# Patient Record
Sex: Male | Born: 1955
Health system: Southern US, Community
[De-identification: ages and names within clinical notes are randomized; demographics above are authoritative.]

## PROBLEM LIST (undated history)

## (undated) DIAGNOSIS — M419 Scoliosis, unspecified: Secondary | ICD-10-CM

## (undated) DIAGNOSIS — I2510 Atherosclerotic heart disease of native coronary artery without angina pectoris: Secondary | ICD-10-CM

## (undated) DIAGNOSIS — I10 Essential (primary) hypertension: Secondary | ICD-10-CM

## (undated) DIAGNOSIS — R569 Unspecified convulsions: Secondary | ICD-10-CM

## (undated) DIAGNOSIS — F32A Depression, unspecified: Secondary | ICD-10-CM

## (undated) DIAGNOSIS — G809 Cerebral palsy, unspecified: Secondary | ICD-10-CM

## (undated) DIAGNOSIS — F329 Major depressive disorder, single episode, unspecified: Secondary | ICD-10-CM

## (undated) DIAGNOSIS — K219 Gastro-esophageal reflux disease without esophagitis: Secondary | ICD-10-CM

## (undated) HISTORY — DX: Major depressive disorder, single episode, unspecified: F32.9

## (undated) HISTORY — DX: Depression, unspecified: F32.A

## (undated) HISTORY — PX: CORONARY ANGIOPLASTY WITH STENT PLACEMENT: SHX49

## (undated) HISTORY — DX: Scoliosis, unspecified: M41.9

## (undated) HISTORY — DX: Cerebral palsy, unspecified: G80.9

## (undated) HISTORY — DX: Gastro-esophageal reflux disease without esophagitis: K21.9

---

## 2007-05-30 ENCOUNTER — Emergency Department (HOSPITAL_COMMUNITY): Admission: EM | Admit: 2007-05-30 | Discharge: 2007-05-30 | Payer: Self-pay | Admitting: Emergency Medicine

## 2007-05-31 ENCOUNTER — Inpatient Hospital Stay (HOSPITAL_COMMUNITY): Admission: EM | Admit: 2007-05-31 | Discharge: 2007-06-01 | Payer: Self-pay | Admitting: Emergency Medicine

## 2007-05-31 ENCOUNTER — Ambulatory Visit: Payer: Self-pay | Admitting: Cardiology

## 2007-06-01 ENCOUNTER — Encounter: Payer: Self-pay | Admitting: Cardiology

## 2007-06-16 ENCOUNTER — Observation Stay (HOSPITAL_COMMUNITY): Admission: EM | Admit: 2007-06-16 | Discharge: 2007-06-17 | Payer: Self-pay | Admitting: Cardiology

## 2007-06-16 ENCOUNTER — Ambulatory Visit: Payer: Self-pay | Admitting: Cardiology

## 2007-07-06 ENCOUNTER — Ambulatory Visit: Payer: Self-pay | Admitting: Cardiology

## 2007-07-27 ENCOUNTER — Ambulatory Visit: Payer: Self-pay | Admitting: Cardiovascular Disease

## 2007-07-27 ENCOUNTER — Ambulatory Visit: Payer: Self-pay

## 2007-08-24 ENCOUNTER — Ambulatory Visit: Payer: Self-pay | Admitting: Cardiology

## 2007-10-12 ENCOUNTER — Emergency Department (HOSPITAL_COMMUNITY): Admission: EM | Admit: 2007-10-12 | Discharge: 2007-10-12 | Payer: Self-pay | Admitting: Emergency Medicine

## 2007-10-25 ENCOUNTER — Inpatient Hospital Stay (HOSPITAL_COMMUNITY): Admission: EM | Admit: 2007-10-25 | Discharge: 2007-10-26 | Payer: Self-pay | Admitting: Emergency Medicine

## 2007-11-09 ENCOUNTER — Emergency Department (HOSPITAL_COMMUNITY): Admission: EM | Admit: 2007-11-09 | Discharge: 2007-11-09 | Payer: Self-pay | Admitting: Emergency Medicine

## 2007-11-13 ENCOUNTER — Encounter: Payer: Self-pay | Admitting: Emergency Medicine

## 2007-11-14 ENCOUNTER — Ambulatory Visit: Payer: Self-pay | Admitting: Psychiatry

## 2007-11-14 ENCOUNTER — Inpatient Hospital Stay (HOSPITAL_COMMUNITY): Admission: AD | Admit: 2007-11-14 | Discharge: 2007-11-15 | Payer: Self-pay | Admitting: Psychiatry

## 2008-11-05 ENCOUNTER — Ambulatory Visit: Payer: Self-pay | Admitting: Cardiology

## 2009-09-01 ENCOUNTER — Ambulatory Visit: Payer: Self-pay | Admitting: Internal Medicine

## 2009-09-01 ENCOUNTER — Encounter: Payer: Self-pay | Admitting: Emergency Medicine

## 2009-09-01 ENCOUNTER — Inpatient Hospital Stay (HOSPITAL_COMMUNITY): Admission: EM | Admit: 2009-09-01 | Discharge: 2009-09-03 | Payer: Self-pay | Admitting: Emergency Medicine

## 2009-10-08 DIAGNOSIS — E785 Hyperlipidemia, unspecified: Secondary | ICD-10-CM | POA: Insufficient documentation

## 2009-10-08 DIAGNOSIS — G809 Cerebral palsy, unspecified: Secondary | ICD-10-CM | POA: Insufficient documentation

## 2009-10-08 DIAGNOSIS — I10 Essential (primary) hypertension: Secondary | ICD-10-CM

## 2009-10-31 ENCOUNTER — Encounter (INDEPENDENT_AMBULATORY_CARE_PROVIDER_SITE_OTHER): Payer: Self-pay | Admitting: *Deleted

## 2010-05-20 NOTE — Letter (Signed)
Summary: Appointment - Missed  [facility]     [place], [place]    Phone:   Fax:      October 31, 2009 MRN: [id number]   [name] [address] [place], [place]  [address]   Dear Mr. Pusey,  Our records indicate you missed your appointment on June 23,2011  with  Dr.  Antoine Poche     It is very important that we reach you to reschedule this appointment. We look forward to participating in your health care needs. Please contact us at the number listed above at your earliest convenience to reschedule this appointment.     Sincerely,   Lorne Skeens  [facility] Scheduling Team

## 2010-07-07 LAB — APTT: aPTT: 27 seconds (ref 24–37)

## 2010-07-07 LAB — GLUCOSE, CAPILLARY
Glucose-Capillary: 103 mg/dL — ABNORMAL HIGH (ref 70–99)
Glucose-Capillary: 106 mg/dL — ABNORMAL HIGH (ref 70–99)
Glucose-Capillary: 113 mg/dL — ABNORMAL HIGH (ref 70–99)
Glucose-Capillary: 98 mg/dL (ref 70–99)
Glucose-Capillary: 99 mg/dL (ref 70–99)

## 2010-07-07 LAB — URINALYSIS, ROUTINE W REFLEX MICROSCOPIC
Glucose, UA: NEGATIVE mg/dL
Leukocytes, UA: NEGATIVE
Specific Gravity, Urine: 1.02 (ref 1.005–1.030)
pH: 6 (ref 5.0–8.0)

## 2010-07-07 LAB — BASIC METABOLIC PANEL
BUN: 5 mg/dL — ABNORMAL LOW (ref 6–23)
CO2: 28 mEq/L (ref 19–32)
Calcium: 8.4 mg/dL (ref 8.4–10.5)
Calcium: 8.8 mg/dL (ref 8.4–10.5)
Chloride: 106 mEq/L (ref 96–112)
Creatinine, Ser: 0.66 mg/dL (ref 0.4–1.5)
Creatinine, Ser: 0.67 mg/dL (ref 0.4–1.5)
GFR calc Af Amer: >60 mL/min (ref >60)
GFR calc non Af Amer: >60 mL/min (ref >60)
GFR calc non Af Amer: >60 mL/min (ref >60)
Glucose, Bld: 114 mg/dL — ABNORMAL HIGH (ref 70–99)
Glucose, Bld: 86 mg/dL (ref 70–99)
Glucose, Bld: 99 mg/dL (ref 70–99)
Potassium: 3 mEq/L — ABNORMAL LOW (ref 3.5–5.1)
Potassium: 3.3 mEq/L — ABNORMAL LOW (ref 3.5–5.1)
Potassium: 3.3 mEq/L — ABNORMAL LOW (ref 3.5–5.1)
Sodium: 137 mEq/L (ref 135–145)
Sodium: 140 mEq/L (ref 135–145)
Sodium: 140 mEq/L (ref 135–145)
Sodium: 140 mEq/L (ref 135–145)

## 2010-07-07 LAB — CBC
HCT: 41 % (ref 39.0–52.0)
HCT: 43.8 % (ref 39.0–52.0)
Hemoglobin: 14.4 g/dL (ref 13.0–17.0)
MCHC: 35.5 g/dL (ref 30.0–36.0)
MCHC: 36.2 g/dL — ABNORMAL HIGH (ref 30.0–36.0)
MCV: 106 fL — ABNORMAL HIGH (ref 78.0–100.0)
Platelets: 161 10*3/uL (ref 150–400)
Platelets: 173 10*3/uL (ref 150–400)
RBC: 3.92 MIL/uL — ABNORMAL LOW (ref 4.22–5.81)
RDW: 12.4 % (ref 11.5–15.5)
RDW: 12.5 % (ref 11.5–15.5)
RDW: 12.6 % (ref 11.5–15.5)
WBC: 7.4 10*3/uL (ref 4.0–10.5)
WBC: 8.4 10*3/uL (ref 4.0–10.5)
WBC: 9.9 10*3/uL (ref 4.0–10.5)

## 2010-07-07 LAB — DIFFERENTIAL
Basophils Absolute: 0 10*3/uL (ref 0.0–0.1)
Basophils Absolute: 0 10*3/uL (ref 0.0–0.1)
Basophils Relative: 1 % (ref 0–1)
Eosinophils Absolute: 0.4 10*3/uL (ref 0.0–0.7)
Eosinophils Absolute: 0.5 10*3/uL (ref 0.0–0.7)
Eosinophils Absolute: 0.7 10*3/uL (ref 0.0–0.7)
Eosinophils Relative: 9 % — ABNORMAL HIGH (ref 0–5)
Lymphocytes Relative: 25 % (ref 12–46)
Lymphocytes Relative: 29 % (ref 12–46)
Lymphocytes Relative: 38 % (ref 12–46)
Lymphocytes Relative: 41 % (ref 12–46)
Lymphs Abs: 2.8 10*3/uL (ref 0.7–4.0)
Lymphs Abs: 3.4 10*3/uL (ref 0.7–4.0)
Monocytes Absolute: 0.6 10*3/uL (ref 0.1–1.0)
Neutro Abs: 3.4 10*3/uL (ref 1.7–7.7)
Neutro Abs: 3.7 10*3/uL (ref 1.7–7.7)
Neutro Abs: 5.3 10*3/uL (ref 1.7–7.7)
Neutro Abs: 6.3 10*3/uL (ref 1.7–7.7)
Neutrophils Relative %: 46 % (ref 43–77)
Neutrophils Relative %: 64 % (ref 43–77)

## 2010-07-07 LAB — URINE MICROSCOPIC-ADD ON

## 2010-07-07 LAB — C-REACTIVE PROTEIN: CRP: 0.5 mg/dL — ABNORMAL LOW (ref <0.6)

## 2010-07-07 LAB — LIPID PANEL
HDL: 30 mg/dL — ABNORMAL LOW (ref >39)
Total CHOL/HDL Ratio: 3.9 RATIO
VLDL: 20 mg/dL (ref 0–40)

## 2010-07-07 LAB — HEPARIN LEVEL (UNFRACTIONATED)
Heparin Unfractionated: 0.25 IU/mL — ABNORMAL LOW (ref 0.30–0.70)
Heparin Unfractionated: 0.42 IU/mL (ref 0.30–0.70)

## 2010-07-07 LAB — PROTIME-INR
INR: 1.04 (ref 0.00–1.49)
Prothrombin Time: 13.5 seconds (ref 11.6–15.2)
Prothrombin Time: 14.8 seconds (ref 11.6–15.2)

## 2010-07-07 LAB — RAPID URINE DRUG SCREEN, HOSP PERFORMED
Cocaine: NOT DETECTED
Opiates: NOT DETECTED
Tetrahydrocannabinol: NOT DETECTED

## 2010-07-07 LAB — TSH: TSH: 0.81 u[IU]/mL (ref 0.350–4.500)

## 2010-07-07 LAB — POCT CARDIAC MARKERS: Troponin i, poc: <0.05 ng/mL (ref 0.00–0.09)

## 2010-07-07 LAB — CARDIAC PANEL(CRET KIN+CKTOT+MB+TROPI)
Relative Index: INVALID (ref 0.0–2.5)
Troponin I: 0.01 ng/mL (ref 0.00–0.06)
Troponin I: 0.01 ng/mL (ref 0.00–0.06)

## 2010-07-07 LAB — SEDIMENTATION RATE: Sed Rate: 4 mm/hr (ref 0–16)

## 2010-07-07 LAB — BRAIN NATRIURETIC PEPTIDE: Pro B Natriuretic peptide (BNP): <30 pg/mL (ref 0.0–100.0)

## 2010-07-07 LAB — MRSA PCR SCREENING: MRSA by PCR: NEGATIVE

## 2010-07-30 ENCOUNTER — Emergency Department (HOSPITAL_COMMUNITY): Payer: Medicare Other

## 2010-07-30 ENCOUNTER — Emergency Department (HOSPITAL_COMMUNITY)
Admission: EM | Admit: 2010-07-30 | Discharge: 2010-07-30 | Disposition: A | Discharge status: Home / Self Care | Payer: Medicare Other | Attending: Emergency Medicine | Admitting: Emergency Medicine

## 2010-07-30 DIAGNOSIS — R0602 Shortness of breath: Secondary | ICD-10-CM | POA: Insufficient documentation

## 2010-07-30 DIAGNOSIS — R0789 Other chest pain: Secondary | ICD-10-CM | POA: Insufficient documentation

## 2010-07-30 DIAGNOSIS — I517 Cardiomegaly: Secondary | ICD-10-CM | POA: Insufficient documentation

## 2010-07-30 DIAGNOSIS — I2510 Atherosclerotic heart disease of native coronary artery without angina pectoris: Secondary | ICD-10-CM | POA: Insufficient documentation

## 2010-07-30 DIAGNOSIS — I10 Essential (primary) hypertension: Secondary | ICD-10-CM | POA: Insufficient documentation

## 2010-07-30 DIAGNOSIS — R569 Unspecified convulsions: Secondary | ICD-10-CM | POA: Insufficient documentation

## 2010-07-30 DIAGNOSIS — G809 Cerebral palsy, unspecified: Secondary | ICD-10-CM | POA: Insufficient documentation

## 2010-07-30 LAB — BASIC METABOLIC PANEL
BUN: 7 mg/dL (ref 6–23)
Calcium: 9.2 mg/dL (ref 8.4–10.5)
Creatinine, Ser: 0.74 mg/dL (ref 0.4–1.5)
GFR calc non Af Amer: >60 mL/min (ref >60)
Potassium: 4 mEq/L (ref 3.5–5.1)

## 2010-07-30 LAB — CBC
HCT: 46.4 % (ref 39.0–52.0)
Hemoglobin: 16.7 g/dL (ref 13.0–17.0)
MCH: 36.9 pg — ABNORMAL HIGH (ref 26.0–34.0)
MCHC: 36 g/dL (ref 30.0–36.0)
MCV: 102.4 fL — ABNORMAL HIGH (ref 78.0–100.0)
Platelets: 157 10*3/uL (ref 150–400)
RBC: 4.53 MIL/uL (ref 4.22–5.81)
RDW: 13 % (ref 11.5–15.5)
WBC: 8 10*3/uL (ref 4.0–10.5)

## 2010-07-30 LAB — DIFFERENTIAL
Basophils Absolute: 0 10*3/uL (ref 0.0–0.1)
Basophils Relative: 1 % (ref 0–1)
Eosinophils Absolute: 0.5 10*3/uL (ref 0.0–0.7)
Eosinophils Relative: 6 % — ABNORMAL HIGH (ref 0–5)
Lymphocytes Relative: 27 % (ref 12–46)
Lymphs Abs: 2.2 10*3/uL (ref 0.7–4.0)
Monocytes Absolute: 0.4 K/uL (ref 0.1–1.0)
Monocytes Relative: 5 % (ref 3–12)
Neutro Abs: 4.9 K/uL (ref 1.7–7.7)
Neutrophils Relative %: 61 % (ref 43–77)

## 2010-07-30 LAB — BASIC METABOLIC PANEL WITH GFR
CO2: 27 meq/L (ref 19–32)
Chloride: 102 meq/L (ref 96–112)
GFR calc Af Amer: >60 mL/min (ref >60)
Glucose, Bld: 86 mg/dL (ref 70–99)
Sodium: 139 meq/L (ref 135–145)

## 2010-07-30 LAB — POCT CARDIAC MARKERS
CKMB, poc: <1 ng/mL — ABNORMAL LOW (ref 1.0–8.0)
Myoglobin, poc: 81.3 ng/mL (ref 12–200)
Troponin i, poc: <0.05 ng/mL (ref 0.00–0.09)
Troponin i, poc: <0.05 ng/mL (ref 0.00–0.09)

## 2010-09-02 NOTE — H&P (Signed)
[name], [name]                ACCOUNT NO.:  [id number]   MEDICAL RECORD NO.:  [id number]          PATIENT TYPE:  OIB   LOCATION:  2807                         FACILITY:  [facility]   PHYSICIAN:  Everardo Beals. Juanda Chance, MD, FACCDATE OF BIRTH:  17-May-1955   DATE OF ADMISSION:  06/16/2007  DATE OF DISCHARGE:                              HISTORY & PHYSICAL   DATE/TIME OF ADMISSION:  June 16, 2007, at 10:00 a.m.   CARDIOLOGIST:  [facility].   PRIMARY PHYSICIAN:  Dr. Christell Constant.   CHIEF COMPLAINT:  Chest pain.   HISTORY OF PRESENT ILLNESS:  This is a 55 year old male with a history  of hypertension, hyperlipidemia, and recurrent chest pain with a recent  negative stress Myoview on June 01, 2007, who has had recurrent  brief episodes of chest pain 3 to 4 times since his discharge, always  associated with stress, who on the morning of admission developed sharp  central chest pain after a stressful conversation with his now ex-  girlfriend.  The pain felt to him like the IV that was stuck in his arm  felt like it was sticking into his chest.  The pain lasted about 1  minute, got a little better on its own, but persisted, so he contacted  EMS.  They gave him nitroglycerin, which decreased his pain from a 10 to  a 2.  They also gave him 4 chewable aspirin.  An EKG at [facility]  showed a question of an inferior infarct, age undetermined, and based on  those findings he was transported directly from [facility] to  the [facility] cath lab to undergo cardiac catheterization by Dr. Everardo Beals. Brodie.   ALLERGIES:  HE HAS A QUESTIONABLE ALLERGY TO FLU MEDICINE.   MEDICATIONS:  1. Niaspan 1000 mg once a night.  2. Diovan 80 mg once a day.  3. Aspirin 325 mg once a day.  4. Multivitamin once daily.  5. Prevacid once daily.   PAST MEDICAL HISTORY:  He has recurrent atypical chest pain, usually  related to stress.  He had a Myoview on June 01, 2007, after an  admission for  chest pain, rule out MI.  There was a question of  anterolateral ischemia and a fixed inferior defect that was attributed  to diaphragmatic attenuation.  It was deemed not to be ischemia and was  likely artifact.  He had an echo as well that was 55% ejection fraction.  He also has hypertension, treated with Diovan, well-controlled.  He had a fasting lipid panel with an LDL of 68, HDL 35, triglycerides  113, cholesterol 126 on Niaspan on February 11.  He also has a history of macrocytosis with an MCV of 105.  B12 was 340.  TSH was 0.375.  He has cerebral palsy with residual left arm weakness and depression.   SOCIAL HISTORY:  He lives in a trailer in [place] by himself.  He has an  ex-girlfriend who provides a significant amount of stress for him.  He  is disabled.  He has a 30-plus pack-year history, hard to get  a specific  smoking history from him.  No alcohol.  No drugs.  He takes no over-the-  counter medications.   He has a significant family history of heart disease with a mother who  had a myocardial infarction in her 22s, a father who had a myocardial  infarction in his 2s, and a brother who had a myocardial infarction in  his 71s.   REVIEW OF SYSTEMS:  Positive for residual left arm weakness as he has  cerebral palsy.  Also stress and depression.   PHYSICAL EXAMINATION:  VITAL SIGNS:  Temperature 98.6, pulse 96,  respiratory rate 16, blood pressure 120/71, O2 sat 96% on room air.  GENERAL APPEARANCE:  He was in no apparent distress and he smelled  heavily of cigarette smoke.  NECK:  Supple with no JVD.  Oropharynx was clear.  He had no  lymphadenopathy.  CARDIOVASCULAR:  S1 and S2.  No murmurs.  Regular rate and rhythm.  LUNGS:  He had coarse bilateral lung sounds but no wheezes or crackles.  SKIN:  He had skin changes of being a chronic smoker with some thick  nails.  ABDOMEN:  Soft, nontender, nondistended.  Normal abdominal bowel sounds.  EXTREMITIES:  He had no lower  extremity edema.  NEUROLOGIC:  He was alert and oriented x 3 with a very thick accent,  making it difficult to understand him, and he seemed to be forgetful at  times and have trouble somewhat with answering straightforward  questions.   His EKG had a rate of 95, normal sinus rhythm, normal axis and  intervals.  The PR 162, QRS 102, QTc 439.  No hypertrophy.  He did have  some question of ST elevation in lead III and aVF on the tele strip on  the EMS and question of an old inferior MI based on his EKG at [facility].   LABORATORIES:  Hemoglobin 15, hematocrit 43, white blood cell count 6.5,  platelets 210.  Sodium 141, potassium 3.7, chloride 103, bicarb 26, BUN  10, creatinine 0.7, glucose 91.  CK 34, MB less than 1, troponin less  than 0.05.   ASSESSMENT/PLAN:  A 55 year old male with atypical chest pain in the  setting of a positive family history of hypertension, mild  hyperlipidemia, an extensive smoking history, aspirin therapy, and a  recent questionable versus negative Myoview.  We will proceed directly  to cardiac cath based on the questionable EKG changes seen at [facility].  This will help [place] rule out an acute MI and assess his  coronary artery disease.  Disposition will be based on the results of  the catheterization to be performed by Dr. Everardo Beals. Juanda Chance, whose cath  report is to follow.      Valetta Close, M.D.  Electronically Signed      Everardo Beals. Juanda Chance, MD, [facility]  Electronically Signed    JC/MEDQ  D:  06/16/2007  T:  06/16/2007  Job:  [id number]

## 2010-09-02 NOTE — Discharge Summary (Signed)
[name], [name]                ACCOUNT NO.:  [id number]   MEDICAL RECORD NO.:  [id number]          PATIENT TYPE:  IPS   LOCATION:  0302                          [facility]ACILITY[facility]  [facility]   PHYSICIAN:  Geoffery Lyons, M.D.      DATE OF BIRTH:  [birth year]   DATE OF ADMISSION:  11/14/2007  DATE OF DISCHARGE:  11/15/2007                               DISCHARGE SUMMARY   IDENTIFYING INFORMATION:  This is a 55 year old Caucasian male who is  single.  This is a voluntary admission.   HISTORY OF PRESENT ILLNESS:  First [facility] admission for this 55 year old  who was accompanied by his sister who involuntarily petitioned him for  admission.  She was concerned because he has been drinking heavily on a  regular basis and said that he has been making phone calls to her house  and to the mother's house threatening suicide.  The patient himself  denies regular alcohol use as he works for a Administrator, wants to go  home and to be able to get to work, and fears that he has lost his job.  York Spaniel that his sister is giving him a lot of trouble.  He does admit that  the day before yesterday he drank a large amount of alcohol for himself,  12 beers and that he was intoxicated.  He said he felt like drinking  because he sees his other friends do this.  Denies that he drinks  regularly.  Denies that he has access to a gun. Denies any suicidal  intent.   PAST PSYCHIATRIC HISTORY:  First [facility] admission.  He has been treated by  his primary care practitioner in the past for alcohol abuse, and at one  point was taking Disulfram to help him stay away from the alcohol.  He  denies a history of prior suicide attempts and is not currently under  any outpatient care.  He said he has been abstinent from alcohol for up  to 2 years at a time.   SOCIAL HISTORY:  Single Caucasian male.  History of cerebral palsy and  limited education.  Currently living alone in his own place.  Has  Medicare and Medicaid resources to help him with  care.  He does have a  recent charge for assault on a male with a court date pending August  3.  Apparently the charge was for pulling her hair and urinating on a  male.   MEDICAL HISTORY:  Primary care practitioner is Dr. Christell Constant at [facility].  Medical problems are hypertension.   CURRENT MEDICATIONS:  Diovan, dose unknown.  Also takes a multivitamin,  Pepcid, Niaspan and aspirin 81 mg.   Physical exam and diagnostic studies were done in the emergency room  where he presented with temperature 99, pulse 100, respirations 18,  blood pressure 145/91.  CBC:  WBC 8.6, hemoglobin 17.2, hematocrit 49.2,  platelets 229,000, MCV 106.7.  Urine drug screen was negative for all  substances.  Alcohol level less than 5.  Chemistries:  Sodium 139,  potassium 3.8, chloride 105, carbon dioxide 27, BUN  8, creatinine 0.67.  CT of his head revealed no acute intracranial abnormality   MENTAL STATUS EXAM:  Fully alert gentleman, is aware of person, place  and situation.  Affect is labile, readily tearful when he tries to talk  about the possibility of losing his job. Intellectually limited.  Speech  mildly pressured but coherent.  He is an unreliable historian.  Mood is  anxious.  Thoughts logical, denying any suicidal thoughts.  Denying any  dangerous ideas whatsoever.  No evidence of paranoia or internal  distractions.  No evidence of psychosis.  Thought process is very  concrete in nature with negligible insight.  Cognition fully oriented  with memory intact.   Axis I: Alcohol abuse, rule out dependence.  Axis II: Deferred.  Axis III: Hypertension and dyslipidemia by history.  Axis IV: Deferred.  Axis V: Current 43.  Past year not known.   PLAN:  To discharge the patient today.  He was denying any dangerous  ideas.  Our case manager has spoken with his sister who was concerned  about his substance use and was hoping that we could somehow make him  possibly go to an  assisted living or go stay somewhere, and we advised  her that he does have the right to self determination.  We did give her  resources that she should consider for getting some support with his  care.  Scheduled follow-up for him with [facility] on November 17, 2007 at 9:00  a.m.  She requested to be able to pick him up today instead of waiting  until tomorrow because of medical appointments that she has scheduled  and she is his only form of transportation.  Therefore, we discharged  him with a plan to follow up at [facility].      Margaret A. Scott, N.P.      Geoffery Lyons, M.D.  Electronically Signed    MAS/MEDQ  D:  11/15/2007  T:  11/15/2007  Job:  [id number]

## 2010-09-02 NOTE — Discharge Summary (Signed)
[name], [name]                ACCOUNT NO.:  [id number]   MEDICAL RECORD NO.:  [id number]          PATIENT TYPE:  INP   LOCATION:  4705                         FACILITY:  [facility]   PHYSICIAN:  Jonelle Sidle, MD DATE OF BIRTH:  1955-08-01   DATE OF ADMISSION:  05/31/2007  DATE OF DISCHARGE:  06/01/2007                         DISCHARGE SUMMARY - REFERRING   DISCHARGE DIAGNOSES:  1. Recurrent atypical chest discomfort.  2. Social issues with increased stress as mentioned previously.  3. Tobacco use.  4. History of hyperlipidemia.  5. History as listed below.   BRIEF HISTORY:  [name] is a 55 year old white male who was referred to  the [facility] emergency room from his primary care physician's office.  Initial plans were that he was seen by [facility]  on the 9th, and plans were for admission and further evaluation.  However, the patient left against AMA.  He came back to the hospital  yesterday due to recurrence of chest discomfort and seeking further  evaluation.  He described this as needle sticks in his chest that her  specifically related to emotional upset.  He denies any exertional  symptoms.  These symptoms have occurred off and on over the last month,  particularly worse over the last week, probable slight improvement with  nitroglycerin and aspirin.  EKG did not show any acute changes.  It was  felt he should be admitted for further evaluation.   It is noted he has a history of cerebral palsy, hypertension,  hyperlipidemia.  He lives alone and difficult to obtain specific  history.  It is noted he is under some stress as he is having some  problems with his niece and some issues with a girlfriend.   LABORATORY DATA:  Chest x-ray on the 10th did not show any active  disease.  Dr. Linton Rump read the nuclear study as suspicious for  anterolateral ischemia and fixed inferior defect, but he felt like this  was diaphragmatic attenuation.  EF was 63%  with normal wall motion.  Dr.  Eden Emms reviewed the Myoview imaging, and he did not agree with these  signs of ischemia.  After overlooking an echocardiogram that was also  performed today which showed an ejection fraction 55% without any wall  motion abnormalities.  Dr. Eden Emms felt his stress test overall was a low  risk.   Admission H&H was 16. and 45.6, MCV was elevated at 104.9, platelets  205, WBCs 8.1.  PT 13.4. ISTAT showed a sodium of 140, potassium 3.6,  BUN 8, creatinine 0.7.  Hemoglobin A1c 5.2.  CK-MBs relative index  negative x3.  Fasting lipids showed a total cholesterol of 126,  triglycerides 113, HDL 35, LDL 68.  TSH 0.375.  Vitamin B12 340.  EKG  showed normal sinus rhythm, normal axis, early R-wave with an incomplete  right bundle branch block, no acute changes.   HOSPITAL COURSE:  The patient was admitted to [facility] with  the resident and Dr. Diona Browner.  Overnight, he did not have any further  chest discomfort.  Enzymes and EKGs were  unremarkable for cardiac event.  Stress Myoview was performed as previously described.  Dr. Eden Emms over  read the radiologist report and with comparison of the echocardiogram  felt that the stress test was very low risk and felt that he could be  discharged home.   DISPOSITION:  The patient is discharged home.  He is asked to continue  his home medications which include Niaspan ER 1000 mg every night,  Diovan 160 mg half a tablet daily, multivitamin daily, Prevacid daily,  aspirin 325 mg daily.  He was advised no smoking or tobacco products, to  bring all medications to all appointments.  He was asked to contact Dr.  Christell Constant for follow-up appointment.   Discharge time 35 minutes.      Joellyn Rued, PA-C      Jonelle Sidle, MD  Electronically Signed    EW/MEDQ  D:  06/01/2007  T:  06/02/2007  Job:  [id number]

## 2010-09-02 NOTE — Consult Note (Signed)
NAMEHAIDAR, MUSE                ACCOUNT NO.:  [id number]   MEDICAL RECORD NO.:  [id number]          PATIENT TYPE:  INP   LOCATION:  4702                         FACILITY:  [facility]   PHYSICIAN:  Antonietta Breach, M.D.  DATE OF BIRTH:  [birth year]   DATE OF CONSULTATION:  10/25/2007  DATE OF DISCHARGE:                                 CONSULTATION   REQUESTING PHYSICIAN:  [facility].   REASON FOR CONSULTATION:  Suicidal ideation, rule out major depression.  Alcohol abuse versus dependence.   HISTORY OF PRESENT ILLNESS:  Mr. Kornegay is a 55 year old male admitted to  the [facility] on July 7 with chest pain and alcohol  intoxication.   The patient denies remembering that he voiced suicidal ideation;  however, he does acknowledge that he drank too much alcohol.  He told  the undersigned that he does not drink every day; however, the attending  physician obtained information that he has been drinking at the six  beers per day.   The patient has been having regular worry and feeling on edge over his  relationship with a male whom is ambivalent about continuing her  relationship with him.  It appears that she is in-and-out of his life.  This is very frustrating for the patient.   However, the patient describes normal interests and constructive future  goals.  He enjoys listening to music and watching TV.  The patient does  have some mild intellectual impairment.  He, however, has intact memory.  He has intact orientation.  He is cooperative with bedside care.  He  describes how he takes care of himself in his home trailer with good  judgment and reasoning; including a description of how he maintains his  diet.   The patient initially upon presentation to the hospital was confused and  belligerent, this has now resolved.   PAST PSYCHIATRIC HISTORY:  The patient recently left the hospital  against medical advise when he was being evaluated for chest pain.   In the past  medical record there is a mention of depression.  The  patient states that his primary care physician put him on an  antidepressant pill.  He cannot remember the name.  He thinks it might  have been Celexa.  He only took it for a few days because it caused him  stomach upset.  The patient states that his depressed mood cleared.  He  has no history of suicide attempts or psychiatric admission.   FAMILY PSYCHIATRIC HISTORY:  None known.   SOCIAL HISTORY:  The patient lives alone in a trailer.  He has had much  worry over a girlfriend, please see above.  He does not use illegal  drugs.   PAST MEDICAL HISTORY:  Mr. Morss alcohol level was initially 278, and  the last check in the emergency room was 145.  The patient does have a  history of cerebral palsy with left arm dysfunction and mild mental  retardation.   He has undergoing a workup for chest pain.  He has hypertension   ALLERGIES:  No known  drug allergies.   MEDICATIONS:  Ativan 2 mg IV q.6 h. p.r.n.   LABORATORY DATA:  WBC 13.4, hemoglobin 8.6, platelet count 385.  Aspirin  and urine drug screen negative.  Alcohol as above.  Amylase 132, lipase  100, SGOT 19, SGPT 14. Tylenol negative.   REVIEW OF SYSTEMS:  Constitutional, head, eyes, ears, nose and throat,  neurologic, psychiatric cardiovascular, respiratory, gastrointestinal,  genitourinary, skin, musculoskeletal, hematologic lymphatic, endocrine,  and metabolic:  All unremarkable.   PHYSICAL EXAMINATION:  VITAL SIGNS:  Temperature 98, pulse 80,  respiratory rate 18, blood pressure 119/72, O2 saturation on room air  97%.  GENERAL APPEARANCE:  Mr. Seelman is a middle-aged male lying in a supine  position in his hospital bed with no abnormal involuntary movements.   MENTAL STATUS EXAM:  Mr. Bilyeu is alert.  His eye contact is good.  His  attention span is within normal limits.  His concentration is within  normal limits.  His affect is mildly anxious.  Mood is mildly  anxious.  He is oriented to all spheres.  His memory is intact to immediate,  recent, and remote, except for the alcohol blackout.  His fund of  knowledge and intelligence are below average.  His speech involves  normal rate and prosody. Thought process is coherent.  Thought content:  No thoughts of harming himself, no thoughts of harming others, no  delusions, no hallucinations.  Insight into his alcohol problem is  partial.  Judgment is overall intact for his ability to self-care--  please see the discussion in the history of present illness.   ASSESSMENT:  AXIS I:  1. (293.00)  Delirium not otherwise specified.  This appears to have      been mostly due to alcohol intoxication; however, he also is      anemic.  Delirium is now resolved.  2. Alcohol dependence, rule out alcohol dependence with physiologic      dependence.  3. (293.84)  Anxiety disorder, not otherwise specified.  AXIS II:  Deferred.  AXIS III:  See past medical history.  AXIS IV:  Primary support group, general medical.  AXIS V:  55.   Mr. Trouten is not at risk to harm himself or others.  He agrees to call  emergency services immediately for any thoughts of harming himself,  thoughts of harming others, or distress.   The undersigned provided ego supportive psychotherapy, and education.   The undersigned recommended that the patient enter alcohol  rehabilitation; however, the patient declined.  He is not committable.   RECOMMENDATIONS:  1. If the patient does begin to show alcohol withdrawal would start      the Ativan detox protocol.  If the patient      changes his mind about alcohol rehabilitation would call [phone]      for information on a program of chemical dependency rehabilitation.  2. Would order folic acid 1 mg, thiamine 100 mg, and a multivitamin      daily indefinitely.      Antonietta Breach, M.D.  Electronically Signed     JW/MEDQ  D:  10/25/2007  T:  10/25/2007  Job:  [id number]

## 2010-09-02 NOTE — Discharge Summary (Signed)
NAMENAYDEN, CZAJKA                ACCOUNT NO.:  [id number]   MEDICAL RECORD NO.:  [id number]          PATIENT TYPE:  OBV   LOCATION:  3731                         FACILITY[facility]  [facility]   PHYSICIAN:  Everardo Beals. Juanda Chance, MD, FACCDATE OF BIRTH:  1956/02/28   DATE OF ADMISSION:  06/16/2007  DATE OF DISCHARGE:  06/17/2007                               DISCHARGE SUMMARY   PRIMARY CARDIOLOGIST:  Rollene Rotunda, MD, [facility].   PRIMARY CARE Mechell Girgis:  Ernestina Penna, M.D.   DISCHARGE DIAGNOSIS:  Chest pain.   SECONDARY DIAGNOSES:  1. Nonobstructive coronary artery disease with normal left ventricular      function by catheterization this admission.  2. Recurrent atypical chest pain.  3. Hypertension.  4. History of macrocytosis without anemia.  5. Gastroesophageal reflux disease.   ALLERGIES:  Some sort of flu medicine.   PROCEDURES:  Left heart cardiac catheterization.   HISTORY OF PRESENT ILLNESS:  A 55 year old male with a prior history of  hypertension and recurrent chest discomfort with negative stress Myoview  on June 01, 2007.  He has continued to have intermittent chest  discomfort and on February 26th, following a stressful argument with his  ex-girlfriend, he had recurrent needle like chest discomfort, lasting  about a minute.  He contacted EMS and was taken the [facility] Emergency  Room where an ECG showed inferior ST-T changes.  He was transferred to  the [facility] for cardiac catheterization.   HOSPITAL COURSE:  The patient ruled out for MI.  He underwent left heart  cardiac catheterization on February 26th, revealing nonobstructive  coronary artery disease with normal LV function and EF of 60%.  He  tolerated the procedure well and post procedure has not had any  recurrent chest discomfort.  It was felt that his pain was not cardiac  in origin and he is being discharged home today in good condition.   DISCHARGE LABORATORY:  Hemoglobin 15.3, hematocrit 43.1, WBC  6.5,  platelets 210, MCV 104.1, neutrophils 48.  Sodium 142, potassium 3.6,  chloride 106, CO2 26, BUN 9, creatinine 0.62, glucose 94.  Total  bilirubin 0.5, alkaline phosphatase 52, AST 18, ALT 13, albumin 3.4.  CK  80, MB 0.7, troponin 0.02.  Calcium 8.9.   DISPOSITION:  The patient is being discharged home today in good  condition.   FOLLOWUP PLANS/APPOINTMENTS:  1. He is to follow up with Dr. Angelina Sheriff in our [place] office on      March 18th at 11:30 a.m.  2. He is asked to follow up with Dr. Christell Constant in 1-2 weeks.   DISCHARGE MEDICATIONS:  1. Niaspan 1000 mg daily.  2. Prevacid 30 mg daily.  3. Diovan 80 mg daily.  4. Aspirin 81 mg daily.  5. Multivitamin one daily.  6. Nitroglycerin 0.4 mg sublingual p.r.n. chest pain.   OUTSTANDING LABORATORY/STUDIES:  None.   DURATION OF DISCHARGE ENCOUNTER:  Forty minutes including physician  time.      Nicolasa Ducking, ANP      Bruce R. Juanda Chance, MD, [facility]  Electronically Signed    CB/MEDQ  D:  06/17/2007  T:  06/17/2007  Job:  [id number]   cc:   Ernestina Penna, M.D.

## 2010-09-02 NOTE — H&P (Signed)
[name], [name]                ACCOUNT NO.:  [id number]   MEDICAL RECORD NO.:  [id number]          PATIENT TYPE:  EMS   LOCATION:  MAJO                         FACILITY:  [facility]   PHYSICIAN:  Jonelle Sidle, MD DATE OF BIRTH:  [birth year]   DATE OF ADMISSION:  05/31/2007  DATE OF DISCHARGE:                              HISTORY & PHYSICAL   PRIMARY CARE PHYSICIAN:  Ernestina Penna, M.D.   REASON FOR ADMISSION:  Recurrent chest pain.   HISTORY OF PRESENT ILLNESS:  [name] is a 55 year old male with a  reported history of cerebral palsy, hypertension, and hyperlipidemia.  He lives alone and handles all of his activities of daily living.  He  denies having any known history of coronary artery disease or myocardial  infarction, although does have a significant family history of such.  Limited information is available to me at this time.  My understanding  is that he was seen by Dr. Christell Constant within the last 24-48 hours and  referred to the [facility] Emergency Department, given a  description of chest pain.  He was apparently seen by [facility]  and Vascular yesterday and plans were for admission to the hospital,  potentially with cardiac catheterization, although the patient  ultimately left the hospital against medical advice.  When asked about  this today, he states that he was upset with his niece and with some  other ongoing personal problems at home involving a girlfriend.  He  states that this morning he had a recurrence of chest pain and came back  to the emergency department for further evaluation.  What he is  describing to me today is a feeling of needle sticks in his chest that  are related specifically to emotional upset.  He denies any reproduction  of this symptom with exertion.  He has had these symptoms off and on  over the last month, perhaps worse over the last week.  He reports some  improvement after getting nitroglycerin and aspirin.  His  electrocardiogram shows nonspecific T-wave changes and some early  repolarization abnormalities.  He has not undergone any prior cardiac  risk stratification.  He is denying any active chest pain on my  interview today in the emergency department and states that he is  willing to stay for further evaluation.  He has a sister present with  him today, and we discussed the situation as well.   ALLERGIES:  No obvious drug allergies.  Possibly an allergy to a flu  shot, this is not well-documented.   HOME MEDICATIONS:  Reportedly include Niaspan, Prevacid, Diovan and  multivitamin.  Doses are not certain.   PAST MEDICAL HISTORY:  1. Cerebral palsy with some limited use and mobility of the left arm      which is chronic.  2. Patient has longstanding hypertension and reportedly      hyperlipidemia.  3. History of depression.   SOCIAL HISTORY:  Patient states that he lives alone in a trailer in  [name], [place].  He has a 30+pack-year  history of smoking.  He  reports that he has a girlfriend and is undergoing some relationship  problems at this time.  He denies any significant use of alcohol or  other illicit substances.  He is presently unemployed and on disability.   FAMILY HISTORY:  Reviewed and significant for myocardial infarction in  the patient's mother in her late 39s, myocardial infarction in the  patient's father in his 80s and myocardial infarction in the patient's  brother in his 61s.   REVIEW OF SYSTEMS:  Significant for recent head cold, this occurred  approximately three weeks ago.  No associated cough, hemoptysis,  breathlessness, orthopnea or PND.  He denies any lower extremity edema.  Reports that his appetite is unchanged.  No active bleeding problems or  bleeding diathesis.  Otherwise systems are negative.   PHYSICAL EXAMINATION:  VITAL SIGNS:  Temperature 99.5 degrees, heart  rate 89, respiratory rate 20, blood pressure 128/69, oxygen saturation  95% on 2  liters nasal cannula.  GENERAL APPEARANCE:  This is a disheveled male in no acute distress.  HEENT:  Conjunctivae grossly normal.  Oropharynx clear with poor  dentition.  NECK:  Supple.  No loud carotid bruits or elevated jugular venous  pressure.  Some fullness of the anterior neck in the region of the  thyroid but nontender.  LUNGS:  Diminished, left more so than right.  No rales or wheezes noted.  CARDIOVASCULAR:  Regular rate and rhythm.  Soft S4, no pericardial rub  or S3 gallop.  ABDOMEN:  Soft and nontender.  Bowel sounds are present.  EXTREMITIES:  No significant pitting edema.  Distal pulses are  diminished.  SKIN:  Warm and dry.  MUSCULOSKELETAL:  No kyphosis is noted.  He does have some cerebral  palsy effects specifically related to the left upper extremity.  NEUROPSYCHIATRIC:  The patient is alert and oriented x3 including  present situation.   LABORATORY DATA:  The laboratory data from yesterday demonstrated wbc  8.1, hemoglobin 16, hematocrit 47, platelets 285.  Sodium 140, potassium  3.6, chloride 105, glucose 95, BUN 8, creatinine 0.7.  Cardiac markers  normal.  Point of care troponin I less than 0.05, normal myoglobin.   Chest x-ray from May 30, 2007, demonstrates some pulmonary vascular  congestion without edema, however.  No focal infiltrate noted.   IMPRESSION:  1. Atypical chest pain in a 54 year old male with longstanding tobacco      use, hypertension, reported hyperlipidemia, and family history of      premature cardiovascular disease.  Electrocardiogram is nonspecific      and point of care cardiac markers are normal.  He is otherwise      hemodynamically stable and symptom-free at this time.  He has had      no prior cardiac risk stratification.  2. History of cerebral palsy.   PLAN:  [facility] refused to see the patient today.  We were called and plan that the patient will be admitted to telemetry.  We will plan to follow up  full set of cardiac markers and proceed  initially with an inpatient 2-D echocardiogram and adenosine Myoview for  further risk stratification assuming he remains clinically stable and  his cardiac markers are not abnormal.  A TSH will also be checked.  We  will follow up a lipid profile as well.  Cardiac catheterization can be  considered depending on results of his noninvasive testing or if his  markers are abnormal.  Further plans to follow.  Jonelle Sidle, MD  Electronically Signed    SGM/MEDQ  D:  05/31/2007  T:  05/31/2007  Job:  [id number]   cc:   Ernestina Penna, M.D.

## 2010-09-02 NOTE — Assessment & Plan Note (Signed)
[facility]                            CARDIOLOGY OFFICE NOTE   SALAM, MICUCCI                         MRN:          [id number]  DATE:07/06/2007                            DOB:          08/05/1955    PRIMARY CARE PHYSICIAN:  Dr. Vernon Prey.   REASON FOR PRESENTATION:  Evaluate patient status post hospitalization  chest pain.   HISTORY OF PRESENT ILLNESS:  The patient was hospitalized in February  with chest discomfort.  He had nonobstructive coronary disease on  cardiac catheterization.  He had no problems with catheterization.  Since going home, he has occasionally still had some discomfort.  He has  had some jerking of his arms.  However, he has had no new  cardiovascular complaints.   PAST MEDICAL HISTORY:  1. Nonobstructive coronary disease (LAD 30% stenosis before diagonal,      first diagonal 70% ostial stenosis, circumflex was free of disease,      right coronary artery was moderate size vessel and free of disease.      The EF was 60%.).  2. Hypertension.  3. Gastroesophageal reflux disease.  4. Cerebral palsy.  5. Depression.   ALLERGIES/INTOLERANCES:  None.   MEDICATIONS:  1. Aspirin 81 mg daily.  2. Niaspan 1000 mg daily.  3. Diovan 80 mg daily.  4. Prevacid.   REVIEW OF SYSTEMS:  As stated in the HPI, otherwise, negative for other  systems.   PHYSICAL EXAMINATION:  GENERAL:  The patient is in no distress.  VITAL SIGNS:  Blood pressure 124/82, heart 78 and regular, weight 211  pounds.  LUNGS:  Clear to auscultation bilaterally.  HEART:  PMI not displaced or sustained, S1-S2 within normal.  No S3-S4,  no clicks, rubs, murmurs.  ABDOMEN:  Mildly obese, positive bowel sounds normal in frequency and  pitch, no bruits, rebound, guarding or midline pulsatile mass,  organomegaly.  SKIN:  No rashes, no nodules.  EXTREMITIES:  Pulses 2+.  The right groin still has the tape and bandage  in place from this catheterization.  There  is no bleeding or bruising.   STUDIES:  EKG:  Sinus rhythm, rate 78, axis within normal limits, RSR,  incomplete right bundle branch block, early transition to lead V2, no  acute ST-wave changes.   ASSESSMENT/PLAN:  1. Chest discomfort.  The patient's chest discomfort is atypical.  He      has nonobstructive disease.  No further cardiovascular testing is      suggested.  He will continue the primary risk reduction.  2. Hypertension.  The patient will continue medicines as listed.  He      is followed by Dr. Christell Constant.  3. Dyslipidemia per Dr. Christell Constant.  A reasonable goal would be LDL less      than 100 and HDL greater than 40.  This is an aggressive goal for      primary therapy.  4. Follow-up will be in this clinic as needed.     Rollene Rotunda, MD, [facility]  Electronically Signed    JH/MedQ  DD: 07/06/2007  DT: 07/06/2007  Job #: [id number]   cc:   Ernestina Penna, M.D.

## 2010-09-02 NOTE — Cardiovascular Report (Signed)
[name], [name]                ACCOUNT NO.:  [id number]   MEDICAL RECORD NO.:  [id number]           PATIENT TYPE:   LOCATION:                                 FACILITY:   PHYSICIAN:  James R. Juanda Chance, MD, FACCDATE OF BIRTH:  02-Oct-1955   DATE OF PROCEDURE:  06/16/2007  DATE OF DISCHARGE:                            CARDIAC CATHETERIZATION   CLINICAL HISTORY:  [name] is 55 years old and has cerebral palsy.  He  was hospitalized 2 weeks ago with chest pain.  He had a stress test  which showed inferior scar versus inferior attenuation, but no definite  ischemia.  Today, he had chest pain after a stressful argument with his  girl friend, and called EMS and was taken to [facility] where  his ECG showed borderline ST elevation in the inferior leads with  inferior Q waves.  Code STEMI was called, and he was transferred to [place]  for angiography.   PROCEDURE:  The procedure was performed via the femoral arteries and  arterial sheath and 6-French preformed coronary catheters.  A front wall  arterial puncture was performed, and Omnipaque contrast was used.  The  patient tolerated the procedure well.  The right femoral artery was  closed with Angio-Seal at the end procedure.   RESULTS:  The aortic pressure was 105/65 with a mean of 84 and left  ventricle pressure was 105/13.   LEFT MAIN CORONARY ARTERY:  Left main coronary artery was free of  significant disease.   LEFT ANTERIOR DESCENDING ARTERY:  Left anterior descending artery had  irregularities.  There was 30% narrowing before the first diagonal  branch.  First diagonal branch had a 70% ostial stenosis.  The LAD then  gave rise to 3 septal perforators.  There was slow, TIMI 2 flow down the  LAD with four cycles to fill the vessel.   CIRCUMFLEX ARTERY:  The circumflex artery gave rise to a ramus branch, 2  marginal branches, and 2 posterolateral branches.  These vessels were  free of significant disease.   RIGHT CORONARY  ARTERY:  Right coronary is a moderate sized vessel which  gave rise to 2 right ventricle branches, a posterior descending branch,  and a posterolateral branch.  This vessel was free of significant  disease.   LEFT VENTRICULOGRAM:  The left ventriculogram performed in the RAO  projection showed good wall motion with no areas of hypokinesis.  Estimated ejection fraction was 60%.   CONCLUSION:  Nonobstructive coronary artery disease with 30% narrowing  in the proximal LAD and 70% narrowing in the ostium of the first  diagonal branch with TIMI 2 flow, no significant obstruction of the  circumflex artery, and no significant obstruction of the right coronary  with normal LV function.   RECOMMENDATIONS:  The patient has nonobstructive coronary artery disease  and his symptoms are quite atypical.  I suspect his pain is noncardiac.  We will get one set of followup enzymes to make sure that there is no  bump in his cardiac markers.  If this is negative, then we will plan  to  let him go home later today, probably with followup either in [place]  or [place].      [name] Juanda Chance, MD, [facility]  Electronically Signed     BRB/MEDQ  D:  06/16/2007  T:  06/17/2007  Job:  [id number]   cc:   Ernestina Penna, M.D.

## 2010-09-02 NOTE — Discharge Summary (Signed)
[name], [name]                ACCOUNT NO.:  [id number]   MEDICAL RECORD NO.:  [id number]          PATIENT TYPE:  INP   LOCATION:  4702                         FACILITY:  [facility]   PHYSICIAN:  Beckey Rutter, MD  DATE OF BIRTH:  [birth year]   DATE OF ADMISSION:  10/25/2007  DATE OF DISCHARGE:  10/26/2007                               DISCHARGE SUMMARY   PRIMARY CARE PHYSICIAN:  Mr. Cabiness is unassigned to [facility].   CHIEF COMPLAINT AND HISTORY OF PRESENT ILLNESS:  This is a 55 year old  with multiple medical problems admitted for alcohol intoxication and to  rule out suicide ideation.   HOSPITAL COURSE:  During hospital course, the patient was seen and  evaluated by Dr. Antonietta Breach, Psychiatry.  The patient was found to  have ability for informed consent.  The patient is not committed for  psyche reason.  The patient did not exhibit suicidal ideation.  His  sitter was discontinued and today he said he is feeling good and he  wanted to sign against medical advice if we did not release him.   The patient is medically stable to be released, although there is some  lab results pending.  I suspect, it will not be a reason to hold his  discharge.  The patient is aware that he needs to follow up for those  results.   DISCHARGE DIAGNOSIS:  1. Alcohol intoxication with possibility of suicide intention.  The      patient ruled out for suicide ability.  2. History of alcohol acute pancreatitis.  3. Substance abuse.  4. History of cerebral palsy.  5. Borderline hypertension.   DISCHARGE MEDICATION:  1. Diovan.  2. Thiamine 100 mg daily.  3. Folic acid 1 mg daily.  4. Multivitamins.  5. Aspirin 81 mg.   DISCHARGE PLAN:  The patient signed against medical advise.  His Niaspan  was discontinued.  It was recommended the patient to follow up with his  primary physician for further liver function test monitoring.  The  importance of followup was stressed time and again for him and  he  understands the medical consequences and the importance of followup  since he is signing against medical advice.      Beckey Rutter, MD  Electronically Signed    EME/MEDQ  D:  10/26/2007  T:  10/27/2007  Job:  [id number]

## 2010-09-02 NOTE — H&P (Signed)
[name], [name]                ACCOUNT NO.:  [id number]   MEDICAL RECORD NO.:  [id number]          PATIENT TYPE:  INP   LOCATION:  4702                         FACILITY:  [facility]   PHYSICIAN:  Herbie Saxon, MDDATE OF BIRTH:  [birth year]   DATE OF ADMISSION:  10/25/2007  DATE OF DISCHARGE:                              HISTORY & PHYSICAL   PRIMARY CARE PHYSICIAN:  Unassigned.   He is a full code and he has no assigned health care power of attorney.   PRESENTING COMPLAINTS:  Chest pain, 5 days and suicidal ideation, 1  week.   HISTORY OF PRESENTING COMPLAINT:  This is a 55 year old male who was  released from jail on Thursday, October 20, 2007.  He is a chronic  alcoholic.  He has been drinking alcohol since he was released from  jail, according to him in a bid to kill himself.  He has also not been  taking his medications for hypertension and cholesterol elevation.  The  patient has been having retrosternal chest pain for the last 5 days with  intermittent swelling of his hand.  He denies any shortness of breath or  palpitation.  No pedal swelling, no orthopnea or paroxysmal nocturnal  dyspnea.  He has also noticed upper abdominal pain, sharp, intermittent,  radiating to the back, severity 5-6/10.  The patient denies any  headache, seizure activity, or syncopal episode.  He has given positive  history of occasional dark stools, but has no hematemesis or  hematochezia.  No history of jaundice.  The patient's condition is  unkempt.  Alcohol level was elevated at presentation.   PAST MEDICAL HISTORY:  Hypertension, hypercholesterolemia, cerebral  palsy, marijuana, tobacco and alcohol abuse, family history of  myocardial infarction.   PAST SURGICAL HISTORY:  Nil of note.   FAMILY HISTORY:  Father died of a massive heart attack.  Mother has  heart disease.   SOCIAL HISTORY:  He is single.  Currently, started having suicidal  ideation after he was turned down by his girlfriend  for a marriage  proposal.  He smokes about a half a pack per day for more than 25 years.  He drinks about 6 cans of beer daily.   REVIEW OF SYSTEMS:  The 14-systems were reviewed and negative.   ALLERGIES:  No known drug allergies.   MEDICATIONS:  1. Diovan.  2. Multivitamin.  3. Niaspan.  4. Aspirin dose is not given.   PHYSICAL EXAMINATION:  GENERAL:  On examination, he is a middle-age man  who is disheveled.  VITAL SIGNS:  Temperature is 97, pulse 79, respiratory rate is 20, and  blood pressure 107/70.  He is pale, not jaundiced.  Not dehydrated.  HEENT:  Pupils are equal, reactive to light and accommodation.  Oropharynx and nasopharynx are clear.  NECK:  Supple.  He has no elevated JVD or thyromegaly.  No carotid  bruits.  HEART:  Heart sounds 1 and 2, regular rate and rhythm.  CHEST:  Clinically clear.  No rales.  [employer] system, there is no murmurs,  gallops or rubs.  ABDOMEN:  Soft  and nontender.  No organomegaly.  Inguinal orifices are  patent.  EXTREMITIES:  No clubbing.  No cyanosis.  NEUROLOGIC:  He is alert and oriented x2.  He has a left upper extremity  deformity contracture.  Power in all of the limbs is 5.  Cranial nerves  II through XII are intact.  Peripheral pulses present.  No pedal edema.   LABORATORY DATA:  WBC is 13, hematocrit 24, and platelet count is 384.  Amylase is 132, lipase 100, and alcohol level 278.  Chemistry, sodium is  146, potassium 3.2, chloride 106, bicarbonate 25, BUN 6, creatinine 0.8,  and glucose is 96.  EKG shows normal sinus rhythm at 87 per minute,  incomplete right bundle-branch block plus old inferior infarct.  Chest x-  ray shows cardiomegaly and mild intravenous congestion.   ASSESSMENT:  1. Chest pain, rule out acute coronary syndrome.  2. Alcoholic intoxication.  3. Acute pancreatitis.  4. Hypokalemia.  5. Hypernatremia.  6. Suicidal ideation.  7. Severe anemia.  8. Rule out occult gastrointestinal bleed.  9. Substance  abuse.  10.History of cerebral palsy.  11.Hypercholesterolemia.  12.Presently borderline hypertension.   PLAN:  The patient will be admitted to telemetry bed on suicidal watch.  We will also put him on bed rest, seizure fall, aspiration, and  decubitus precautions.  He is to be on oxygen 2-4 L via nasal cannula  p.r.n., to keep him auscultated nightly, Morphine 2 mg  IV q.6 h. p.r.n.  for chest pain, nitroglycerin 0.4 mg sublingual p.r.n., metoprolol 12.5  mg daily, Diovan 40 mg b.i.d., hold blood pressure meds if blood  pressure level is 100/60, Niaspan 500 mg daily.  We typed and cross  matched 2 units of packed red blood cells and transfused 1 unit of  packed red blood cells; gave him IV Lasix 40 mg IV daily p.r.n. for  pulmonary congestion.  He should be on Ativan protocol IV 2 mg q.6 h. if  necessary for agitation.  We will get a psych evaluation, replete his  potassium with IV fluid normal saline 80 mL/hour with 20 mEq potassium  iodide watching for fluid overload.  We will obtain an  abdominal  ultrasound scan, get a serial cardiac enzymes on EKG q.8 h. X3.  Also  obtain his BNP, thyroid function test, fasting lipids, homocysteine, and  urinalysis.  Check his hemoccult x3.  Also obtain his coagulation  parameters.      Herbie Saxon, MD  Electronically Signed     MIO/MEDQ  D:  10/25/2007  T:  10/25/2007  Job:  [id number]

## 2010-09-05 NOTE — Assessment & Plan Note (Signed)
[facility]                            CARDIOLOGY OFFICE NOTE   [name], GEATHERS                         MRN:          [id number]  DATE:07/27/2006                            DOB:          [birth year]    LOCATION:  [facility].   PRIMARY CARE DOCTOR:  Dr. Vernon Prey.   PRIMARY CARDIOLOGIST:  Dr. Antoine Poche.   Mr. Weimer is a 55 year old white male patient who has cerebral palsy, who  had a heart catheterization June 16, 2007 that showed non-  obstructive coronary artery disease, with a 30% proximal LAD, 70% in the  ostium of the first diagonal, and normal circumflex and RCA with normal  LV function.  He is sent here today by Dr. Christell Constant, because he continues  to have some jerking sensations in his chest when he lays down at night.  He says he feels his heart beating irregular.  It does not occur any  other time of the day.  He has no associated dizziness or presyncopal  signs or symptoms, dyspnea, or diaphoresis.   CURRENT MEDICATIONS:  1. Aspirin 81 mg daily.  2. Niaspan 1,000 mg daily.  3. Diovan 80 mg daily.  4. Prevacid 30 mg daily.  5. Multivitamin daily.   PHYSICAL EXAM:  GENERAL:  This is a pleasant 55 year old white male in  no acute distress.  VITAL SIGNS:  Blood pressure 121/72, pulse 77, weight 212  NECK:  Without JVD, HJR, bruit or thyroid enlargement.  LUNGS:  Clear anterior, posterior, and lateral.  HEART:  Regular rate and rhythm at 77 beats per minute, normal S1 and  S2, positive S4.  No significant murmur heard.  ABDOMEN:  Soft without organomegaly, masses, lesions or abnormal  tenderness.  EXTREMITIES:  Without cyanosis, clubbing or edema.   IMPRESSION:  1. Palpitations.  2. Non-obstructive coronary artery disease on cardiac catheterization      on June 16, 2007.  3. Hypertension.  4. Gastroesophageal reflux disease.  5. Cerebral palsy.  6. Depression.   PLAN:  At this time, will put on event recorder to rule  out any  arrhythmias, and he will see Dr. Antoine Poche back in [place] in one month.  He did have TSH and all labs when he was hospitalized in February, so I  will not repeat those.      Jacolyn Reedy, PA-C  Electronically Signed      Noralyn Pick. Eden Emms, MD, [facility]  Electronically Signed   ML/MedQ  DD: 07/27/2007  DT: 07/27/2007  Job #: [id number]

## 2011-01-05 DIAGNOSIS — R079 Chest pain, unspecified: Secondary | ICD-10-CM

## 2011-01-09 LAB — LIPID PANEL
HDL: 35 — ABNORMAL LOW
LDL Cholesterol: 68
Triglycerides: 113
VLDL: 23

## 2011-01-09 LAB — I-STAT 8, (EC8 V) (CONVERTED LAB)
Acid-Base Excess: 3 — ABNORMAL HIGH
BUN: 8
Bicarbonate: 27.9 — ABNORMAL HIGH
Bicarbonate: 28.4 — ABNORMAL HIGH
Chloride: 105
HCT: 47
HCT: 49
Operator id: 151321
Operator id: 294501
pCO2, Ven: 39 — ABNORMAL LOW
pCO2, Ven: 43.3 — ABNORMAL LOW
pH, Ven: 7.425 — ABNORMAL HIGH
pH, Ven: 7.463 — ABNORMAL HIGH

## 2011-01-09 LAB — PROTIME-INR
INR: 1
Prothrombin Time: 13.4

## 2011-01-09 LAB — CBC
HCT: 45.6
HCT: 45.8
HCT: 43.1
Hemoglobin: 16
Hemoglobin: 15.3
MCHC: 35.2
MCHC: 35.5
MCV: 104.9 — ABNORMAL HIGH
MCV: 104.1 — ABNORMAL HIGH
Platelets: 205
RBC: 4.36
RBC: 4.14 — ABNORMAL LOW
RDW: 12.6
WBC: 6.5

## 2011-01-09 LAB — CARDIAC PANEL(CRET KIN+CKTOT+MB+TROPI)
Relative Index: INVALID
Troponin I: 0.01
Troponin I: 0.02

## 2011-01-09 LAB — POCT CARDIAC MARKERS
CKMB, poc: <1 — ABNORMAL LOW
Myoglobin, poc: 32.7
Myoglobin, poc: 33.5
Myoglobin, poc: 34.4
Operator id: 294501
Operator id: 213721
Troponin i, poc: <0.05
Troponin i, poc: <0.05

## 2011-01-09 LAB — COMPREHENSIVE METABOLIC PANEL
AST: 18
CO2: 26
Calcium: 8.9
Chloride: 106
Creatinine, Ser: 0.62
GFR calc Af Amer: >60
GFR calc non Af Amer: >60
Glucose, Bld: 94
Total Bilirubin: 0.5

## 2011-01-09 LAB — FOLATE RBC: RBC Folate: 898 — ABNORMAL HIGH

## 2011-01-09 LAB — POCT I-STAT CREATININE
Creatinine, Ser: 0.7
Operator id: 151321

## 2011-01-09 LAB — BASIC METABOLIC PANEL
BUN: 6
CO2: 27
Calcium: 8.8
Chloride: 102
Creatinine, Ser: 0.65
Glucose, Bld: 98

## 2011-01-09 LAB — DIFFERENTIAL
Basophils Absolute: 0
Basophils Absolute: 0
Basophils Relative: 0
Eosinophils Absolute: 0.3
Eosinophils Absolute: 0.5
Eosinophils Relative: 4
Eosinophils Relative: 8 — ABNORMAL HIGH
Lymphocytes Relative: 36
Lymphs Abs: 2.3
Monocytes Absolute: 0.5
Neutrophils Relative %: 48

## 2011-01-09 LAB — POCT I-STAT, CHEM 8
BUN: 10
Calcium, Ion: 1.11 — ABNORMAL LOW
Creatinine, Ser: 0.7
Hemoglobin: 15.3
TCO2: 26

## 2011-01-09 LAB — TSH: TSH: 0.375

## 2011-01-09 LAB — VITAMIN B12: Vitamin B-12: 340 (ref 211–911)

## 2011-01-15 LAB — COMPREHENSIVE METABOLIC PANEL
ALT: 13
AST: 18
Albumin: 3.3 — ABNORMAL LOW
Alkaline Phosphatase: 50
Glucose, Bld: 81
Potassium: 3.6
Sodium: 140
Total Protein: 6.2

## 2011-01-15 LAB — ABO/RH: ABO/RH(D): O POS

## 2011-01-15 LAB — CBC
HCT: 24.9 — ABNORMAL LOW
Hemoglobin: 8.6 — ABNORMAL LOW
MCHC: 35.5
MCV: 106.5 — ABNORMAL HIGH
RBC: 2.25 — ABNORMAL LOW
RBC: 4.48
WBC: 13.4 — ABNORMAL HIGH

## 2011-01-15 LAB — TYPE AND SCREEN

## 2011-01-15 LAB — POCT CARDIAC MARKERS
CKMB, poc: <1 — ABNORMAL LOW
Myoglobin, poc: 47.3
Operator id: 277751
Troponin i, poc: <0.05

## 2011-01-15 LAB — DIFFERENTIAL
Basophils Absolute: 0
Lymphocytes Relative: 22
Lymphs Abs: 3
Monocytes Absolute: 0.8
Monocytes Relative: 6
Neutro Abs: 9.1 — ABNORMAL HIGH

## 2011-01-15 LAB — RAPID URINE DRUG SCREEN, HOSP PERFORMED
Amphetamines: NOT DETECTED
Barbiturates: NOT DETECTED
Benzodiazepines: NOT DETECTED
Tetrahydrocannabinol: NOT DETECTED

## 2011-01-15 LAB — POCT I-STAT, CHEM 8
Glucose, Bld: 96
HCT: 48
Hemoglobin: 16.3
Potassium: 3.2 — ABNORMAL LOW

## 2011-01-15 LAB — CARDIAC PANEL(CRET KIN+CKTOT+MB+TROPI)
Relative Index: INVALID
Relative Index: INVALID
Relative Index: INVALID
Total CK: 69
Total CK: 78
Troponin I: <0.01

## 2011-01-15 LAB — URINALYSIS, ROUTINE W REFLEX MICROSCOPIC
Bilirubin Urine: NEGATIVE
Glucose, UA: NEGATIVE
Hgb urine dipstick: NEGATIVE
Specific Gravity, Urine: 1.01
pH: 7.5

## 2011-01-15 LAB — HEPATIC FUNCTION PANEL
ALT: 14
AST: 19
Alkaline Phosphatase: 50
Bilirubin, Direct: 0.2
Total Bilirubin: 0.9

## 2011-01-15 LAB — ETHANOL
Alcohol, Ethyl (B): 145 — ABNORMAL HIGH
Alcohol, Ethyl (B): 278 — ABNORMAL HIGH

## 2011-01-15 LAB — ACETAMINOPHEN LEVEL: Acetaminophen (Tylenol), Serum: <10 — ABNORMAL LOW

## 2011-01-15 LAB — LIPID PANEL
Cholesterol: 109
Total CHOL/HDL Ratio: 2.9

## 2011-01-16 LAB — BASIC METABOLIC PANEL
BUN: 7
CO2: 25
Calcium: 8.7
Chloride: 109
Creatinine, Ser: 0.67
Creatinine, Ser: 1.23
GFR calc Af Amer: >60
GFR calc non Af Amer: >60
Glucose, Bld: 108 — ABNORMAL HIGH
Glucose, Bld: 87
Sodium: 139

## 2011-01-16 LAB — CBC
Hemoglobin: 17.2 — ABNORMAL HIGH
MCHC: 34.8
MCV: 106.8 — ABNORMAL HIGH
Platelets: 216
RBC: 4.61
RDW: 12.8
RDW: 13

## 2011-01-16 LAB — DIFFERENTIAL
Basophils Absolute: 0
Basophils Relative: 0
Basophils Relative: 0
Eosinophils Absolute: 0.2
Eosinophils Relative: 2
Lymphocytes Relative: 32
Monocytes Absolute: 0.6
Neutro Abs: 4.8
Neutrophils Relative %: 55
Neutrophils Relative %: 61

## 2011-01-16 LAB — RAPID URINE DRUG SCREEN, HOSP PERFORMED
Amphetamines: NOT DETECTED
Benzodiazepines: NOT DETECTED
Tetrahydrocannabinol: NOT DETECTED

## 2011-01-16 LAB — POCT CARDIAC MARKERS
Myoglobin, poc: 79.4
Operator id: 211291

## 2011-01-16 LAB — ETHANOL: Alcohol, Ethyl (B): 165 — ABNORMAL HIGH

## 2011-04-27 DIAGNOSIS — R509 Fever, unspecified: Secondary | ICD-10-CM | POA: Diagnosis not present

## 2011-06-10 DIAGNOSIS — R319 Hematuria, unspecified: Secondary | ICD-10-CM | POA: Diagnosis not present

## 2011-06-10 DIAGNOSIS — R1031 Right lower quadrant pain: Secondary | ICD-10-CM | POA: Diagnosis not present

## 2011-06-22 DIAGNOSIS — J069 Acute upper respiratory infection, unspecified: Secondary | ICD-10-CM | POA: Diagnosis not present

## 2011-07-07 DIAGNOSIS — R5381 Other malaise: Secondary | ICD-10-CM | POA: Diagnosis not present

## 2011-07-16 DIAGNOSIS — T23099A Burn of unspecified degree of multiple sites of unspecified wrist and hand, initial encounter: Secondary | ICD-10-CM | POA: Diagnosis not present

## 2011-07-16 DIAGNOSIS — J40 Bronchitis, not specified as acute or chronic: Secondary | ICD-10-CM | POA: Diagnosis not present

## 2011-08-27 DIAGNOSIS — I10 Essential (primary) hypertension: Secondary | ICD-10-CM | POA: Diagnosis not present

## 2011-08-27 DIAGNOSIS — Z79899 Other long term (current) drug therapy: Secondary | ICD-10-CM | POA: Diagnosis not present

## 2011-08-27 DIAGNOSIS — E785 Hyperlipidemia, unspecified: Secondary | ICD-10-CM | POA: Diagnosis not present

## 2011-08-27 DIAGNOSIS — R569 Unspecified convulsions: Secondary | ICD-10-CM | POA: Diagnosis not present

## 2011-10-13 DIAGNOSIS — M545 Low back pain: Secondary | ICD-10-CM | POA: Diagnosis not present

## 2011-10-13 DIAGNOSIS — S60229A Contusion of unspecified hand, initial encounter: Secondary | ICD-10-CM | POA: Diagnosis not present

## 2011-10-15 DIAGNOSIS — M545 Low back pain: Secondary | ICD-10-CM | POA: Diagnosis not present

## 2011-10-15 DIAGNOSIS — M542 Cervicalgia: Secondary | ICD-10-CM | POA: Diagnosis not present

## 2011-12-05 ENCOUNTER — Encounter (HOSPITAL_COMMUNITY): Payer: Self-pay | Admitting: Emergency Medicine

## 2011-12-05 ENCOUNTER — Emergency Department (HOSPITAL_COMMUNITY)
Admission: EM | Admit: 2011-12-05 | Discharge: 2011-12-05 | Disposition: A | Discharge status: Home / Self Care | Payer: Medicare Other | Attending: Emergency Medicine | Admitting: Emergency Medicine

## 2011-12-05 ENCOUNTER — Emergency Department (HOSPITAL_COMMUNITY): Payer: Medicare Other

## 2011-12-05 DIAGNOSIS — F17200 Nicotine dependence, unspecified, uncomplicated: Secondary | ICD-10-CM | POA: Insufficient documentation

## 2011-12-05 DIAGNOSIS — M542 Cervicalgia: Secondary | ICD-10-CM | POA: Diagnosis not present

## 2011-12-05 DIAGNOSIS — I219 Acute myocardial infarction, unspecified: Secondary | ICD-10-CM | POA: Diagnosis not present

## 2011-12-05 DIAGNOSIS — J984 Other disorders of lung: Secondary | ICD-10-CM | POA: Diagnosis not present

## 2011-12-05 DIAGNOSIS — I10 Essential (primary) hypertension: Secondary | ICD-10-CM | POA: Insufficient documentation

## 2011-12-05 DIAGNOSIS — R071 Chest pain on breathing: Secondary | ICD-10-CM | POA: Diagnosis not present

## 2011-12-05 DIAGNOSIS — Z23 Encounter for immunization: Secondary | ICD-10-CM | POA: Insufficient documentation

## 2011-12-05 DIAGNOSIS — S0003XA Contusion of scalp, initial encounter: Secondary | ICD-10-CM | POA: Diagnosis not present

## 2011-12-05 DIAGNOSIS — T1490XA Injury, unspecified, initial encounter: Secondary | ICD-10-CM | POA: Diagnosis not present

## 2011-12-05 DIAGNOSIS — S0083XA Contusion of other part of head, initial encounter: Secondary | ICD-10-CM

## 2011-12-05 DIAGNOSIS — R079 Chest pain, unspecified: Secondary | ICD-10-CM | POA: Diagnosis not present

## 2011-12-05 DIAGNOSIS — I2510 Atherosclerotic heart disease of native coronary artery without angina pectoris: Secondary | ICD-10-CM | POA: Insufficient documentation

## 2011-12-05 DIAGNOSIS — R51 Headache: Secondary | ICD-10-CM | POA: Diagnosis not present

## 2011-12-05 DIAGNOSIS — R0789 Other chest pain: Secondary | ICD-10-CM

## 2011-12-05 HISTORY — DX: Atherosclerotic heart disease of native coronary artery without angina pectoris: I25.10

## 2011-12-05 HISTORY — DX: Essential (primary) hypertension: I10

## 2011-12-05 HISTORY — DX: Unspecified convulsions: R56.9

## 2011-12-05 LAB — CBC WITH DIFFERENTIAL/PLATELET
HCT: 46.5 % (ref 39.0–52.0)
Hemoglobin: 17 g/dL (ref 13.0–17.0)
Lymphocytes Relative: 29 % (ref 12–46)
Lymphs Abs: 2.3 10*3/uL (ref 0.7–4.0)
MCHC: 36.6 g/dL — ABNORMAL HIGH (ref 30.0–36.0)
Monocytes Absolute: 0.6 10*3/uL (ref 0.1–1.0)
Monocytes Relative: 8 % (ref 3–12)
Neutro Abs: 4.5 10*3/uL (ref 1.7–7.7)
Neutrophils Relative %: 56 % (ref 43–77)
RBC: 4.6 MIL/uL (ref 4.22–5.81)
WBC: 8.1 10*3/uL (ref 4.0–10.5)

## 2011-12-05 LAB — BASIC METABOLIC PANEL
BUN: 16 mg/dL (ref 6–23)
CO2: 28 mEq/L (ref 19–32)
Chloride: 101 mEq/L (ref 96–112)
Creatinine, Ser: 0.62 mg/dL (ref 0.50–1.35)
GFR calc Af Amer: >90 mL/min (ref >90)
Glucose, Bld: 93 mg/dL (ref 70–99)
Potassium: 4.1 mEq/L (ref 3.5–5.1)

## 2011-12-05 LAB — CARDIAC PANEL(CRET KIN+CKTOT+MB+TROPI)
Relative Index: 1.5 (ref 0.0–2.5)
Troponin I: <0.3 ng/mL (ref <0.30)

## 2011-12-05 MED ORDER — TETANUS-DIPHTH-ACELL PERTUSSIS 5-2.5-18.5 LF-MCG/0.5 IM SUSP
0.5000 mL | Freq: Once | INTRAMUSCULAR | Status: AC
  Administered 2011-12-05: 0.5 mL via INTRAMUSCULAR
  Filled 2011-12-05: qty 0.5

## 2011-12-05 NOTE — ED Notes (Signed)
NAD noted at time of d/c home. Pt transported home by cab approved by social work

## 2011-12-05 NOTE — ED Notes (Signed)
Pt unable to locate a ride home
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
## 2011-12-05 NOTE — ED Notes (Signed)
Social Worker called ie: pt transportation home. Pt given bag lunch.

## 2011-12-05 NOTE — ED Notes (Signed)
Pt states his PCP recently told him to d/c his depakote. Pt answering questions appropriately, has to be redirected often. Pt states "I drank a few beers this morning"
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
## 2011-12-05 NOTE — ED Notes (Signed)
Pt to radiology.
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
## 2011-12-18 DIAGNOSIS — J069 Acute upper respiratory infection, unspecified: Secondary | ICD-10-CM | POA: Diagnosis not present

## 2012-02-02 DIAGNOSIS — J069 Acute upper respiratory infection, unspecified: Secondary | ICD-10-CM | POA: Diagnosis not present

## 2012-02-13 DIAGNOSIS — L02519 Cutaneous abscess of unspecified hand: Secondary | ICD-10-CM | POA: Diagnosis not present

## 2012-02-13 DIAGNOSIS — L03119 Cellulitis of unspecified part of limb: Secondary | ICD-10-CM | POA: Diagnosis not present

## 2012-02-15 DIAGNOSIS — M109 Gout, unspecified: Secondary | ICD-10-CM | POA: Diagnosis not present

## 2012-02-15 DIAGNOSIS — M79609 Pain in unspecified limb: Secondary | ICD-10-CM | POA: Diagnosis not present

## 2012-02-15 DIAGNOSIS — L03119 Cellulitis of unspecified part of limb: Secondary | ICD-10-CM | POA: Diagnosis not present

## 2012-02-15 DIAGNOSIS — L0390 Cellulitis, unspecified: Secondary | ICD-10-CM | POA: Diagnosis not present

## 2012-04-19 DIAGNOSIS — J019 Acute sinusitis, unspecified: Secondary | ICD-10-CM | POA: Diagnosis not present

## 2012-04-22 DIAGNOSIS — R05 Cough: Secondary | ICD-10-CM | POA: Diagnosis not present

## 2012-04-22 DIAGNOSIS — G809 Cerebral palsy, unspecified: Secondary | ICD-10-CM | POA: Diagnosis not present

## 2012-04-22 DIAGNOSIS — R079 Chest pain, unspecified: Secondary | ICD-10-CM | POA: Diagnosis not present

## 2012-04-22 DIAGNOSIS — I10 Essential (primary) hypertension: Secondary | ICD-10-CM | POA: Diagnosis not present

## 2012-04-22 DIAGNOSIS — J209 Acute bronchitis, unspecified: Secondary | ICD-10-CM | POA: Diagnosis not present

## 2012-04-22 DIAGNOSIS — R404 Transient alteration of awareness: Secondary | ICD-10-CM | POA: Diagnosis not present

## 2012-04-22 DIAGNOSIS — R42 Dizziness and giddiness: Secondary | ICD-10-CM | POA: Diagnosis not present

## 2012-04-22 DIAGNOSIS — R51 Headache: Secondary | ICD-10-CM | POA: Diagnosis not present

## 2012-04-22 DIAGNOSIS — Z9861 Coronary angioplasty status: Secondary | ICD-10-CM | POA: Diagnosis not present

## 2012-04-22 DIAGNOSIS — J40 Bronchitis, not specified as acute or chronic: Secondary | ICD-10-CM | POA: Diagnosis not present

## 2012-04-22 DIAGNOSIS — F17200 Nicotine dependence, unspecified, uncomplicated: Secondary | ICD-10-CM | POA: Diagnosis not present

## 2012-09-02 ENCOUNTER — Ambulatory Visit: Payer: Medicare Other | Admitting: Family Medicine

## 2012-09-02 VITALS — BP 152/97 | HR 85 | Temp 97.4°F | Ht 70.0 in | Wt 205.0 lb

## 2012-09-02 DIAGNOSIS — I2510 Atherosclerotic heart disease of native coronary artery without angina pectoris: Secondary | ICD-10-CM

## 2012-09-02 DIAGNOSIS — E7800 Pure hypercholesterolemia, unspecified: Secondary | ICD-10-CM

## 2012-09-02 DIAGNOSIS — I10 Essential (primary) hypertension: Secondary | ICD-10-CM

## 2012-09-02 DIAGNOSIS — G809 Cerebral palsy, unspecified: Secondary | ICD-10-CM

## 2012-09-02 NOTE — Progress Notes (Signed)
Patient ID: [name], male   DOB: [birth year], 57 y.o.   MRN: [id number] Patient not seen. Couldn't wait.  Chritopher Coster P. Modesto Charon, M.D.

## 2013-02-25 ENCOUNTER — Ambulatory Visit: Payer: Medicare Other | Admitting: Nurse Practitioner

## 2013-03-03 ENCOUNTER — Ambulatory Visit (INDEPENDENT_AMBULATORY_CARE_PROVIDER_SITE_OTHER): Payer: Medicare Other | Admitting: Family Medicine

## 2013-03-03 ENCOUNTER — Telehealth: Payer: Self-pay | Admitting: Family Medicine

## 2013-03-03 ENCOUNTER — Ambulatory Visit (INDEPENDENT_AMBULATORY_CARE_PROVIDER_SITE_OTHER): Payer: Medicare Other

## 2013-03-03 ENCOUNTER — Encounter: Payer: Self-pay | Admitting: Family Medicine

## 2013-03-03 VITALS — BP 138/85 | HR 84 | Temp 98.0°F | Wt 210.0 lb

## 2013-03-03 DIAGNOSIS — R05 Cough: Secondary | ICD-10-CM

## 2013-03-03 NOTE — Telephone Encounter (Signed)
appt given for today 

## 2013-03-03 NOTE — Progress Notes (Signed)
  Subjective:    Patient ID: MAHMUD KEITHLY, male    DOB: 03-31-1956, 57 y.o.   MRN: [id number]  HPI  This 57 y.o. male presents for evaluation of cough and bronchitis.  Review of Systems    No chest pain, SOB, HA, dizziness, vision change, N/V, diarrhea, constipation, dysuria, urinary urgency or frequency, myalgias, arthralgias or rash.  Objective:   Physical Exam  Vital signs noted  Well developed well nourished male.  HEENT - Head atraumatic Normocephalic                Eyes - PERRLA, Conjuctiva - clear Sclera- Clear EOMI                Ears - EAC's Wnl TM's Wnl Gross Hearing WNL                Throat - oropharanx wnl Respiratory - Lungs CTA bilateral Cardiac - RRR S1 and S2 without murmur GI - Abdomen soft Nontender and bowel sounds active x 4 Extremities - No edema. Neuro - Grossly intact.  cxr - no infiltrates Prelimnary reading by Angeline Slim    Assessment & Plan:  Cough - Plan: DG Chest 2 View Suprax 400mg  one po qd x 5 days #5 samples.  Follow up prn.  Deatra Canter FNP

## 2013-03-03 NOTE — Patient Instructions (Signed)

## 2013-06-23 ENCOUNTER — Ambulatory Visit (INDEPENDENT_AMBULATORY_CARE_PROVIDER_SITE_OTHER): Payer: Medicare Other | Admitting: Family Medicine

## 2013-06-23 VITALS — BP 132/86 | HR 90 | Temp 97.2°F | Ht 70.0 in | Wt 222.8 lb

## 2013-06-23 DIAGNOSIS — Z202 Contact with and (suspected) exposure to infections with a predominantly sexual mode of transmission: Secondary | ICD-10-CM | POA: Diagnosis not present

## 2013-06-23 DIAGNOSIS — K219 Gastro-esophageal reflux disease without esophagitis: Secondary | ICD-10-CM

## 2013-06-23 DIAGNOSIS — R350 Frequency of micturition: Secondary | ICD-10-CM

## 2013-06-23 DIAGNOSIS — R5383 Other fatigue: Secondary | ICD-10-CM

## 2013-06-23 DIAGNOSIS — R5381 Other malaise: Secondary | ICD-10-CM | POA: Diagnosis not present

## 2013-06-23 DIAGNOSIS — F17200 Nicotine dependence, unspecified, uncomplicated: Secondary | ICD-10-CM

## 2013-06-23 DIAGNOSIS — M25569 Pain in unspecified knee: Secondary | ICD-10-CM | POA: Diagnosis not present

## 2013-06-23 DIAGNOSIS — A64 Unspecified sexually transmitted disease: Secondary | ICD-10-CM | POA: Diagnosis not present

## 2013-06-23 DIAGNOSIS — Z0000 Encounter for general adult medical examination without abnormal findings: Secondary | ICD-10-CM

## 2013-06-23 DIAGNOSIS — E785 Hyperlipidemia, unspecified: Secondary | ICD-10-CM | POA: Diagnosis not present

## 2013-06-23 DIAGNOSIS — Z720 Tobacco use: Secondary | ICD-10-CM

## 2013-06-23 LAB — POCT CBC
Granulocyte percent: 65.2 %G (ref 37–80)
HCT, POC: 49.3 % (ref 43.5–53.7)
Hemoglobin: 16.3 g/dL (ref 14.1–18.1)
Lymph, poc: 3 (ref 0.6–3.4)
MCH, POC: 34.7 pg — AB (ref 27–31.2)
MCHC: 33 g/dL (ref 31.8–35.4)
MCV: 104.9 fL — AB (ref 80–97)
MPV: 6.6 fL (ref 0–99.8)
POC Granulocyte: 6.7 (ref 2–6.9)
POC LYMPH PERCENT: 29.2 %L (ref 10–50)
Platelet Count, POC: 233 10*3/uL (ref 142–424)
RBC: 4.7 M/uL (ref 4.69–6.13)
RDW, POC: 12.5 %
WBC: 10.2 10*3/uL (ref 4.6–10.2)

## 2013-06-23 MED ORDER — NICOTINE 14 MG/24HR TD PT24: 14.0000 mg | MEDICATED_PATCH | Freq: Every day | TRANSDERMAL | Status: DC

## 2013-06-23 MED ORDER — NICOTINE 7 MG/24HR TD PT24: 7.0000 mg | MEDICATED_PATCH | Freq: Every day | TRANSDERMAL | Status: DC

## 2013-06-23 MED ORDER — HYDROCODONE-ACETAMINOPHEN 5-325 MG PO TABS: 1.0000 | ORAL_TABLET | Freq: Four times a day (QID) | ORAL | Status: DC | PRN

## 2013-06-23 MED ORDER — OMEPRAZOLE 20 MG PO CPDR: 20.0000 mg | DELAYED_RELEASE_CAPSULE | Freq: Every day | ORAL | Status: DC

## 2013-06-23 MED ORDER — NAPROXEN 500 MG PO TABS: 500.0000 mg | ORAL_TABLET | Freq: Two times a day (BID) | ORAL | Status: DC

## 2013-06-23 NOTE — Patient Instructions (Signed)
Chlamydia Test This a test to see if you have Chlamydia which is a common sexually transmitted disease (STD). You get this disease by having sexual contact (oral, vaginal, or anal) with an infected person. An infected mother can spread the disease to her baby during childbirth. If this test is positive, you have a sexually transmitted disease (STD). DO NOT have unprotected sex until the treatment is complete and you have been retested and are negative. PREPARATION FOR TEST Your caregiver will use a swab to take a sample. The swab may be from the cervix, urethra, penis, anus, or throat. It may be possible to use a urine sample, if the lab where the sample is sent is able to test urine for this disease. NORMAL FINDINGS  No Growth  Antibodies: less than 1:640 Ranges for normal findings may vary among different laboratories and hospitals. You should always check with your doctor after having lab work or other tests done to discuss the meaning of your test results and whether your values are considered within normal limits. MEANING OF TEST  Your caregiver will go over the test results with you and discuss the importance and meaning of your results, as well as treatment options and the need for additional tests if necessary. OBTAINING THE TEST RESULTS It is your responsibility to obtain your test results. Ask the lab or department performing the test when and how you will get your results. Document Released: 04/29/2004 Document Revised: 06/29/2011 Document Reviewed: 03/15/2008 [facility] Patient Information 2014 [place], [place].

## 2013-06-23 NOTE — Progress Notes (Signed)
   Subjective:    Patient ID: [name], male    DOB: [birth year], 58 y.o.   MRN: [id number]  HPI This 58 y.o. male presents for evaluation of multiple medical complaints.  He has been having Severe right knee pain.  He has been painting and thinks he injured his right knee.  He has been Having urinary frequency.  He states he had sex with a women who may have STD.  He has hx Of sz disorder and sees a neurologist.  He c/o fatigue.  He is a cigarette smoker and wants to quit smoking. He has hx of CAD and hypertension.  He has not had a cpe or labs in awhile.   Review of Systems No chest pain, SOB, HA, dizziness, vision change, N/V, diarrhea, constipation, dysuria, urinary urgency or frequency, myalgias, arthralgias or rash.     Objective:   Physical Exam Vital signs noted  Well developed well nourished male.  HEENT - Head atraumatic Normocephalic                Eyes - PERRLA, Conjuctiva - clear Sclera- Clear EOMI                Ears - EAC's Wnl TM's Wnl Gross Hearing WNL                Nose - Nares patent                 Throat - oropharanx wnl Respiratory - Lungs CTA bilateral Cardiac - RRR S1 and S2 without murmur GI - Abdomen soft Nontender and bowel sounds active x 4 Extremities - No edema. Neuro - Grossly intact. MS - TTP right knee with crepitus      Assessment & Plan:  Knee pain - Plan: naproxen (NAPROSYN) 500 MG tablet, HYDROcodone-acetaminophen (NORCO) 5-325 MG per tablet, DG Knee 1-2 Views Right, POCT CBC, Uric acid, TSH, GC/Chlamydia Probe Amp  Tobacco abuse - Plan: nicotine (NICODERM CQ - DOSED IN MG/24 HOURS) 14 mg/24hr patch, TSH, DG Chest 2 View  Routine general medical examination at a health care facility - Plan: POCT CBC, CMP14+EGFR, PSA, total and free, Lipid panel, TSH  GERD (gastroesophageal reflux disease) - Plan: POCT CBC, TSH, omeprazole (PRILOSEC) 20 MG capsule  Exposure to STD - Plan: GC/Chlamydia Probe Amp, RPR, HIV antibody  STD (male) -  Plan: GC/Chlamydia Probe Amp, RPR, HIV antibody  Other and unspecified hyperlipidemia - Plan: Lipid panel, TSH  Urine frequency - Plan: POCT CBC, PSA, total and free, GC/Chlamydia Probe Amp  Other malaise and fatigue - Plan: TSH  Spent over 45 minutes in visit with patient  Follow up next week   Lysbeth Penner FNP

## 2013-06-24 LAB — CMP14+EGFR
ALT: 12 IU/L (ref 0–44)
AST: 19 IU/L (ref 0–40)
Albumin/Globulin Ratio: 1.8 (ref 1.1–2.5)
Albumin: 4.4 g/dL (ref 3.5–5.5)
Alkaline Phosphatase: 75 IU/L (ref 39–117)
BUN/Creatinine Ratio: 12 (ref 9–20)
BUN: 9 mg/dL (ref 6–24)
CO2: 26 mmol/L (ref 18–29)
Calcium: 9.4 mg/dL (ref 8.7–10.2)
Chloride: 97 mmol/L (ref 97–108)
Creatinine, Ser: 0.76 mg/dL (ref 0.76–1.27)
GFR calc Af Amer: 119 mL/min/{1.73_m2} (ref >59)
GFR calc non Af Amer: 103 mL/min/{1.73_m2} (ref >59)
Globulin, Total: 2.4 g/dL (ref 1.5–4.5)
Glucose: 95 mg/dL (ref 65–99)
Potassium: 4.1 mmol/L (ref 3.5–5.2)
Sodium: 139 mmol/L (ref 134–144)
Total Bilirubin: 0.7 mg/dL (ref 0.0–1.2)
Total Protein: 6.8 g/dL (ref 6.0–8.5)

## 2013-06-24 LAB — LIPID PANEL
Chol/HDL Ratio: 5.3 ratio units — ABNORMAL HIGH (ref 0.0–5.0)
Cholesterol, Total: 164 mg/dL (ref 100–199)
HDL: 31 mg/dL — ABNORMAL LOW (ref >39)
LDL Calculated: 74 mg/dL (ref 0–99)
Triglycerides: 295 mg/dL — ABNORMAL HIGH (ref 0–149)
VLDL Cholesterol Cal: 59 mg/dL — ABNORMAL HIGH (ref 5–40)

## 2013-06-24 LAB — PSA, TOTAL AND FREE
PSA, Free Pct: 40 %
PSA, Free: 0.12 ng/mL
PSA: 0.3 ng/mL (ref 0.0–4.0)

## 2013-06-24 LAB — TSH: TSH: 1.2 u[IU]/mL (ref 0.450–4.500)

## 2013-06-24 LAB — HIV ANTIBODY (ROUTINE TESTING W REFLEX)
HIV 1/O/2 Abs-Index Value: <1 (ref <1.00)
HIV-1/HIV-2 Ab: NONREACTIVE

## 2013-06-24 LAB — URIC ACID: Uric Acid: 6.1 mg/dL (ref 3.7–8.6)

## 2013-06-24 LAB — RPR: RPR: NONREACTIVE

## 2013-06-27 ENCOUNTER — Other Ambulatory Visit: Payer: Self-pay | Admitting: Family Medicine

## 2013-06-27 LAB — GC/CHLAMYDIA PROBE AMP
Chlamydia trachomatis, NAA: NEGATIVE
Neisseria gonorrhoeae by PCR: NEGATIVE

## 2013-06-27 MED ORDER — FOLIC ACID 1 MG PO TABS: 1.0000 mg | ORAL_TABLET | Freq: Every day | ORAL | Status: DC

## 2013-06-29 ENCOUNTER — Ambulatory Visit: Payer: Medicare Other | Admitting: Family Medicine

## 2013-07-11 ENCOUNTER — Telehealth: Payer: Self-pay | Admitting: Family Medicine

## 2013-07-13 ENCOUNTER — Encounter: Payer: Self-pay | Admitting: *Deleted

## 2013-07-24 NOTE — Telephone Encounter (Signed)
Detailed message left that if he needs to schedule an appt to please call back and ask for the triage nurse

## 2013-07-27 ENCOUNTER — Telehealth: Payer: Self-pay | Admitting: Family Medicine

## 2013-07-28 ENCOUNTER — Telehealth: Payer: Self-pay | Admitting: Family Medicine

## 2013-07-28 ENCOUNTER — Other Ambulatory Visit: Payer: Self-pay | Admitting: Family Medicine

## 2013-07-28 DIAGNOSIS — K219 Gastro-esophageal reflux disease without esophagitis: Secondary | ICD-10-CM

## 2013-07-28 MED ORDER — FOLIC ACID 1 MG PO TABS: 1.0000 mg | ORAL_TABLET | Freq: Every day | ORAL | Status: DC

## 2013-07-28 MED ORDER — OMEPRAZOLE 20 MG PO CPDR: 20.0000 mg | DELAYED_RELEASE_CAPSULE | Freq: Every day | ORAL | Status: DC

## 2013-07-28 NOTE — Telephone Encounter (Signed)
rx sent to pharmacy

## 2013-07-28 NOTE — Telephone Encounter (Signed)
Folic acid sent to pharmacy

## 2013-08-01 ENCOUNTER — Telehealth: Payer: Self-pay | Admitting: Family Medicine

## 2013-08-02 ENCOUNTER — Other Ambulatory Visit: Payer: Self-pay | Admitting: Family Medicine

## 2013-08-02 MED ORDER — FOLIC ACID 1 MG PO TABS: 1.0000 mg | ORAL_TABLET | Freq: Every day | ORAL | Status: DC

## 2013-08-15 ENCOUNTER — Telehealth: Payer: Self-pay | Admitting: Family Medicine

## 2013-08-15 NOTE — Telephone Encounter (Signed)
appt made. Pt requested tom, no ride to be see today.

## 2013-08-16 ENCOUNTER — Ambulatory Visit (INDEPENDENT_AMBULATORY_CARE_PROVIDER_SITE_OTHER): Payer: Medicare Other | Admitting: Family Medicine

## 2013-08-16 ENCOUNTER — Ambulatory Visit (INDEPENDENT_AMBULATORY_CARE_PROVIDER_SITE_OTHER): Payer: Medicare Other

## 2013-08-16 ENCOUNTER — Encounter: Payer: Self-pay | Admitting: Family Medicine

## 2013-08-16 ENCOUNTER — Other Ambulatory Visit: Payer: Self-pay | Admitting: Family Medicine

## 2013-08-16 VITALS — BP 144/88 | HR 90 | Temp 98.4°F | Ht 70.0 in | Wt 221.0 lb

## 2013-08-16 DIAGNOSIS — K1370 Unspecified lesions of oral mucosa: Secondary | ICD-10-CM

## 2013-08-16 DIAGNOSIS — J029 Acute pharyngitis, unspecified: Secondary | ICD-10-CM | POA: Diagnosis not present

## 2013-08-16 DIAGNOSIS — R042 Hemoptysis: Secondary | ICD-10-CM

## 2013-08-16 DIAGNOSIS — F17200 Nicotine dependence, unspecified, uncomplicated: Secondary | ICD-10-CM | POA: Diagnosis not present

## 2013-08-16 DIAGNOSIS — M25569 Pain in unspecified knee: Secondary | ICD-10-CM

## 2013-08-16 DIAGNOSIS — Z720 Tobacco use: Secondary | ICD-10-CM

## 2013-08-16 MED ORDER — AZITHROMYCIN 250 MG PO TABS: ORAL_TABLET | ORAL | Status: DC

## 2013-08-16 MED ORDER — NAPROXEN 500 MG PO TABS
500.0000 mg | ORAL_TABLET | Freq: Two times a day (BID) | ORAL | Status: DC
Start: 2013-08-16 — End: 2013-11-15

## 2013-08-16 MED ORDER — NICOTINE 14 MG/24HR TD PT24: 14.0000 mg | MEDICATED_PATCH | Freq: Every day | TRANSDERMAL | Status: DC

## 2013-08-16 MED ORDER — NICOTINE 7 MG/24HR TD PT24: 7.0000 mg | MEDICATED_PATCH | Freq: Every day | TRANSDERMAL | Status: DC

## 2013-08-16 NOTE — Progress Notes (Signed)
   Subjective:    Patient ID: [name], male    DOB: [birth year], 58 y.o.   MRN: [id number]  HPI This 58 y.o. male presents for evaluation of oral lesion at base of tongue.  He has noticed this for Over 3 days now.  He has been having a sore throat and uri sx's.  He has hx of heavy cigarette smoking.  He is taking prilosec for GERD.  He states this is helping.  He states he did cough this am and had some blood in his sputum.   Review of Systems C/o hemoptysis and oral lesion No chest pain, SOB, HA, dizziness, vision change, N/V, diarrhea, constipation, dysuria, urinary urgency or frequency, myalgias, arthralgias or rash.     Objective:   Physical Exam  Vital signs noted  Well developed well nourished male.  HEENT - Head atraumatic Normocephalic                Eyes - PERRLA, Conjuctiva - clear Sclera- Clear EOMI                Ears - EAC's Wnl TM's Wnl Gross Hearing WNL                Nose - Nares patent                 Throat - Poor dentition and at base of tongue he has a palpable mass Respiratory - Lungs CTA bilateral Cardiac - RRR S1 and S2 without murmur GI - Abdomen soft Nontender and bowel sounds active x 4 Extremities - No edema. Neuro - Grossly intact.  CXR -  No infiltrates Prelimnary reading by Iverson Alamin     Assessment & Plan:  Oral lesion - Plan: Ambulatory referral to Oral Maxillofacial Surgery  Acute pharyngitis - Plan: azithromycin (ZITHROMAX) 250 MG tablet, Ambulatory referral to Oral Maxillofacial Surgery  Tobacco abuse - Plan: nicotine (NICODERM CQ - DOSED IN MG/24 HOURS) 14 mg/24hr patch  Hemoptysis - Plan: DG Chest 2 View  Lysbeth Penner FNP

## 2013-08-22 ENCOUNTER — Ambulatory Visit (HOSPITAL_COMMUNITY)
Admission: RE | Admit: 2013-08-22 | Discharge: 2013-08-22 | Disposition: A | Discharge status: Home / Self Care | Payer: Medicare Other | Source: Ambulatory Visit | Attending: Family Medicine | Admitting: Family Medicine

## 2013-08-22 DIAGNOSIS — R042 Hemoptysis: Secondary | ICD-10-CM | POA: Insufficient documentation

## 2013-08-22 DIAGNOSIS — F17200 Nicotine dependence, unspecified, uncomplicated: Secondary | ICD-10-CM | POA: Insufficient documentation

## 2013-08-22 DIAGNOSIS — R918 Other nonspecific abnormal finding of lung field: Secondary | ICD-10-CM | POA: Diagnosis not present

## 2013-08-22 DIAGNOSIS — M2749 Other cysts of jaw: Secondary | ICD-10-CM | POA: Diagnosis not present

## 2013-08-22 DIAGNOSIS — K056 Periodontal disease, unspecified: Secondary | ICD-10-CM | POA: Diagnosis not present

## 2013-08-22 DIAGNOSIS — K069 Disorder of gingiva and edentulous alveolar ridge, unspecified: Principal | ICD-10-CM

## 2013-08-22 DIAGNOSIS — R221 Localized swelling, mass and lump, neck: Secondary | ICD-10-CM

## 2013-08-22 DIAGNOSIS — K1370 Unspecified lesions of oral mucosa: Secondary | ICD-10-CM

## 2013-08-22 DIAGNOSIS — R220 Localized swelling, mass and lump, head: Secondary | ICD-10-CM | POA: Diagnosis not present

## 2013-08-22 MED ORDER — IOHEXOL 300 MG/ML  SOLN
80.0000 mL | Freq: Once | INTRAMUSCULAR | Status: AC | PRN
  Administered 2013-08-22: 80 mL via INTRAVENOUS

## 2013-08-25 ENCOUNTER — Telehealth: Payer: Self-pay

## 2013-09-06 ENCOUNTER — Telehealth: Payer: Self-pay | Admitting: *Deleted

## 2013-09-06 NOTE — Telephone Encounter (Signed)
Spoke with pt regarding CT scan and cough Pt stated cough was better and did not want to be seen at this time He will call back for appt if cough worsens

## 2013-09-07 ENCOUNTER — Encounter: Payer: Self-pay | Admitting: Family Medicine

## 2013-09-07 ENCOUNTER — Ambulatory Visit (INDEPENDENT_AMBULATORY_CARE_PROVIDER_SITE_OTHER): Payer: Medicare Other | Admitting: Family Medicine

## 2013-09-07 VITALS — BP 132/87 | HR 90 | Temp 98.5°F | Wt 220.6 lb

## 2013-09-07 DIAGNOSIS — J209 Acute bronchitis, unspecified: Secondary | ICD-10-CM | POA: Diagnosis not present

## 2013-09-07 DIAGNOSIS — M25569 Pain in unspecified knee: Secondary | ICD-10-CM

## 2013-09-07 DIAGNOSIS — K089 Disorder of teeth and supporting structures, unspecified: Secondary | ICD-10-CM

## 2013-09-07 MED ORDER — HYDROCODONE-ACETAMINOPHEN 5-325 MG PO TABS: 1.0000 | ORAL_TABLET | Freq: Four times a day (QID) | ORAL | Status: DC | PRN

## 2013-09-07 MED ORDER — AMOXICILLIN 875 MG PO TABS: 875.0000 mg | ORAL_TABLET | Freq: Two times a day (BID) | ORAL | Status: DC

## 2013-09-07 NOTE — Progress Notes (Signed)
   Subjective:    Patient ID: [name], male    DOB: October 31, 1955, 58 y.o.   MRN: [id number]  HPI This 58 y.o. male presents for evaluation of oral mass and follow up on CT of chest.  He had c/o oral lesion and was referred to maxillofacial and is set up for dental extraction.  He has tooth discomfort.  He has hx of tobacco abuse.  He has had some hemoptysis and bronchitis sx's and CT of chest was  Normal except showing some bronchitis or inflamed bronchioles with congestion.   Review of Systems C/o tooth pain   No chest pain, SOB, HA, dizziness, vision change, N/V, diarrhea, constipation, dysuria, urinary urgency or frequency, myalgias, arthralgias or rash.  Objective:   Physical Exam  Vital signs noted  Well developed well nourished male.  HEENT - Head atraumatic Normocephalic                Eyes - PERRLA, Conjuctiva - clear Sclera- Clear EOMI                Ears - EAC's Wnl TM's Wnl Gross Hearing WNL                Nose - Nares patent                 Throat - mouth with poor dentition Respiratory - Lungs CTA bilateral Cardiac - RRR S1 and S2 without murmur GI - Abdomen soft Nontender and bowel sounds active x 4 Extremities - No edema. Neuro - Grossly intact.      Assessment & Plan:  Knee pain - Hydrocodone  Acute bronchitis - Plan: HYDROcodone-acetaminophen (NORCO) 5-325 MG per tablet, amoxicillin (AMOXIL) 875 MG tablet  Poor dentition - Plan: HYDROcodone-acetaminophen (NORCO) 5-325 MG per tablet, amoxicillin (AMOXIL) 875 MG   Push po fluids, rest, tylenol and motrin otc prn as directed for fever, arthralgias, and myalgias.  Follow up prn if sx's continue or persist.  Lysbeth Penner FNP

## 2013-09-25 ENCOUNTER — Ambulatory Visit (INDEPENDENT_AMBULATORY_CARE_PROVIDER_SITE_OTHER): Payer: Medicare Other | Admitting: Family Medicine

## 2013-09-25 ENCOUNTER — Encounter: Payer: Self-pay | Admitting: Family Medicine

## 2013-09-25 VITALS — BP 131/86 | HR 101 | Temp 99.0°F | Ht 70.0 in | Wt 218.0 lb

## 2013-09-25 DIAGNOSIS — I2510 Atherosclerotic heart disease of native coronary artery without angina pectoris: Secondary | ICD-10-CM | POA: Diagnosis not present

## 2013-09-25 DIAGNOSIS — K219 Gastro-esophageal reflux disease without esophagitis: Secondary | ICD-10-CM | POA: Diagnosis not present

## 2013-09-25 DIAGNOSIS — R079 Chest pain, unspecified: Secondary | ICD-10-CM

## 2013-09-25 DIAGNOSIS — Z01818 Encounter for other preprocedural examination: Secondary | ICD-10-CM | POA: Diagnosis not present

## 2013-09-25 MED ORDER — OMEPRAZOLE 20 MG PO CPDR: 20.0000 mg | DELAYED_RELEASE_CAPSULE | Freq: Two times a day (BID) | ORAL | Status: DC

## 2013-09-25 NOTE — Progress Notes (Signed)
   Subjective:    Patient ID: [name], male    DOB: [birth year], 58 y.o.   MRN: [id number]  HPI This 58 y.o. male presents for evaluation of medical clearance for dental extraction.  He has hx of poor dentition and he has seen oral surgeon who wants to do dental extraction.  He has hx of coronary artery stent for cad and has not seen a cardiologist in years.  He has been having some chest pain and GERD sx's.   Review of Systems C/o chest pain and GERD sx's No SOB, HA, dizziness, vision change, N/V, diarrhea, constipation, dysuria, urinary urgency or frequency, myalgias, arthralgias or rash.     Objective:   Physical Exam  Vital signs noted  Well developed well nourished male.  HEENT - Head atraumatic Normocephalic                Eyes - PERRLA, Conjuctiva - clear Sclera- Clear EOMI                Ears - EAC's Wnl TM's Wnl Gross Hearing WNL                Nose - Nares patent                 Throat - oropharanx with poor dentition Respiratory - Lungs CTA bilateral Cardiac - RRR S1 and S2 without murmur GI - Abdomen soft Nontender and bowel sounds active x 4 Extremities - No edema. Neuro - Grossly intact.  EKG - NSR with probable early repolarization in inferior leads and no change since last EKG in 2013 Lysbeth Penner FNP    Assessment & Plan:  GERD (gastroesophageal reflux disease) - Plan: omeprazole (PRILOSEC) 20 MG capsule  CAD (coronary artery disease) - Plan: EKG 12-Lead, Ambulatory referral to Cardiology  Chest pain - Plan: EKG 12-Lead, Ambulatory referral to Cardiology  Recommend he have cardiology consult for pre-op clearance for dental extraction.  Lysbeth Penner FNP

## 2013-11-15 ENCOUNTER — Encounter: Payer: Self-pay | Admitting: Cardiology

## 2013-11-15 ENCOUNTER — Ambulatory Visit (INDEPENDENT_AMBULATORY_CARE_PROVIDER_SITE_OTHER): Payer: Medicare Other | Admitting: Cardiology

## 2013-11-15 VITALS — BP 136/86 | HR 104 | Ht 73.0 in | Wt 208.0 lb

## 2013-11-15 DIAGNOSIS — I2510 Atherosclerotic heart disease of native coronary artery without angina pectoris: Secondary | ICD-10-CM | POA: Diagnosis not present

## 2013-11-15 MED ORDER — ASPIRIN EC 81 MG PO TBEC: 81.0000 mg | DELAYED_RELEASE_TABLET | Freq: Every day | ORAL | Status: DC

## 2013-11-15 NOTE — Patient Instructions (Signed)
Please start a baby [name] a day. Continue all other medications as listed.  Your physician has requested that you have an exercise tolerance test. For further information please visit [email or web address]. Please also follow instruction sheet, as given.  Further follow up will be based on these results.

## 2013-11-15 NOTE — Progress Notes (Signed)
HPI  the patient presents as a new patient. I did see in many years ago. I reviewed some old records and he's had previous catheterization with 70% diagonal  stenosis and  30% LAD stenosis. He seems to think he had stents that I don't see any evidence of this.   He was sent back for followup because of ongoing risk factors. He's able to do some walking though he is a little but limited by balance from cerebral palsy. He doesn't report any symptoms. The patient denies any new symptoms such as chest discomfort, neck or arm discomfort. There has been no new shortness of breath, PND or orthopnea. There have been no reported palpitations, presyncope or syncope.   Unfortunately he still smokes cigarettes.  No Known Allergies  Current Outpatient Prescriptions  Medication Sig Dispense Refill  . divalproex (DEPAKOTE) 250 MG DR tablet Take 250 mg by mouth 2 (two) times daily.      [name] omeprazole (PRILOSEC) 20 MG capsule Take 1 capsule (20 mg total) by mouth 2 (two) times daily before a meal.  60 capsule  3   No current facility-administered medications for this visit.    Past Medical History  Diagnosis Date  . Hypertension   . Seizures   . Coronary artery disease   . Scoliosis   . Cerebral palsy   . Depression   . GERD (gastroesophageal reflux disease)     Past Surgical History  Procedure Laterality Date  . Coronary angioplasty with stent placement      No family history on file.  History   Social History  . Marital Status: Single    Spouse Name: N/A    Number of Children: N/A  . Years of Education: N/A   Occupational History  . Not on file.   Social History Main Topics  . Smoking status: Current Every Day Smoker -- 1.00 packs/day    Types: Cigarettes  . Smokeless tobacco: Not on file  . Alcohol Use: Yes     Comment: daily pt states "just a few"  . Drug Use: No  . Sexual Activity: Not on file   Other Topics Concern  . Not on file   Social History Narrative  . No  narrative on file    ROS:   Positive for arthritis, cramps, reflux. Otherwise as stated in the HPI and negative for all other systems.  PHYSICAL EXAM BP 136/86  Pulse 104  Ht 6\' 1"  (1.854 m)  Wt 208 lb (94.348 kg)  BMI 27.45 kg/m2 GENERAL:  Well appearing HEENT:  Pupils equal round and reactive, fundi not visualized, oral mucosa unremarkable NECK:  No jugular venous distention, waveform within normal limits, carotid upstroke brisk and symmetric, no bruits, no thyromegaly LYMPHATICS:  No cervical, inguinal adenopathy LUNGS:  Clear to auscultation bilaterally BACK:  No CVA tenderness CHEST:  Unremarkable HEART:  PMI not displaced or sustained,S1 and S2 within normal limits, no S3, no S4, no clicks, no rubs, no murmurs ABD:  Flat, positive bowel sounds normal in frequency in pitch, no bruits, no rebound, no guarding, no midline pulsatile mass, no hepatomegaly, no splenomegaly EXT:  2 plus pulses throughout, no edema, no cyanosis no clubbing SKIN:  No rashes no nodules NEURO:  Cranial nerves II through XII grossly intact, motor grossly intact throughout , evidence of cerebral palsy PSYCH:  Cognitively intact, oriented to person place and time  EKG:   Normal sinus rhythm, rate 90, right bundle branch block, possible old  Inferior  infarct, probable left atrial enlargement, no acute ST changes 11/15/2013  ASSESSMENT AND PLAN  CAD:   The patient had nonobstructive coronary disease. I do not see any evidence of previous stents. He does have ongoing risk factors. I think it would be reasonable to do a stress test.  I will bring the patient back for a POET (Plain Old Exercise Test). This will allow me to screen for obstructive coronary disease, risk stratify and very importantly provide a prescription for exercise.   He's not taking an aspirin for unclear reasons and I will restart a low dose baby aspirin.  HTN:  The blood pressure is at target. No change in medications is indicated. We will  continue with therapeutic lifestyle changes (TLC)  TOBACCO ABUSE:   He says he's not able to quit smoking he will think about it. We did discuss this.

## 2013-11-29 ENCOUNTER — Ambulatory Visit (INDEPENDENT_AMBULATORY_CARE_PROVIDER_SITE_OTHER): Payer: Medicare Other

## 2013-11-29 DIAGNOSIS — I2510 Atherosclerotic heart disease of native coronary artery without angina pectoris: Secondary | ICD-10-CM | POA: Diagnosis not present

## 2013-11-29 NOTE — Progress Notes (Addendum)
Exercise Treadmill Test  Pre-Exercise Testing Evaluation Rhythm: normal sinus  Rate: 86 bpm     Test  Exercise Tolerance Test Ordering MD: Marijo File, MD  Interpreting MD: Melina Copa, PA  Unique Test No: 1  Treadmill:  1  Indication for ETT: known ASHD  Contraindication to ETT: No   Stress Modality: exercise - treadmill  Cardiac Imaging Performed: non   Protocol: standard Bruce - maximal  Max BP: 144/79  Max MPHR (bpm):  165 85% MPR (bpm):  140  MPHR obtained (bpm):  96 % MPHR obtained:  N/A  Reached 85% MPHR (min:sec):  N/A Total Exercise Time (min-sec):  17 seconds  Workload in METS:  N/A Borg Scale: N/A  Reason ETT Terminated:  inability to coordinate treadmill    ST Segment Analysis At Rest: non-specific ST segment slurring  Other Information Arrhythmia:  No Angina during ETT:  Unable to perform Quality of ETT: unable to perform  Comments: Patient has history of cerebal palsy. He had a fairly normal gait in the hallway but on the treadmill had a small-stepped, shuffling, unsteady gait. He was unable to coordinate the treadmill safely today, even at much slower speeds than the Bruce protocol. Due to safety risk, we aborted the procedure. No symptoms reported today.  Recommendations:  Will schedule Lexiscan nuclear stress test. He has h/o tobacco abuse but no wheezing and no known COPD. Further per Dr. Percival Spanish pending results.  James Littler PA-C

## 2013-12-01 ENCOUNTER — Telehealth: Payer: Self-pay | Admitting: Family Medicine

## 2013-12-01 NOTE — Telephone Encounter (Signed)
appt scheduled

## 2013-12-04 ENCOUNTER — Ambulatory Visit: Payer: Medicare Other | Admitting: Nurse Practitioner

## 2013-12-06 ENCOUNTER — Encounter: Payer: Self-pay | Admitting: Cardiology

## 2013-12-06 ENCOUNTER — Ambulatory Visit (HOSPITAL_COMMUNITY): Payer: Medicare Other | Attending: Cardiology | Admitting: Radiology

## 2013-12-06 VITALS — BP 135/78 | HR 68 | Ht 73.0 in | Wt 210.0 lb

## 2013-12-06 DIAGNOSIS — F17200 Nicotine dependence, unspecified, uncomplicated: Secondary | ICD-10-CM | POA: Insufficient documentation

## 2013-12-06 DIAGNOSIS — I2510 Atherosclerotic heart disease of native coronary artery without angina pectoris: Secondary | ICD-10-CM | POA: Insufficient documentation

## 2013-12-06 DIAGNOSIS — I4510 Unspecified right bundle-branch block: Secondary | ICD-10-CM | POA: Diagnosis not present

## 2013-12-06 DIAGNOSIS — R079 Chest pain, unspecified: Secondary | ICD-10-CM | POA: Diagnosis not present

## 2013-12-06 DIAGNOSIS — Z9861 Coronary angioplasty status: Secondary | ICD-10-CM | POA: Insufficient documentation

## 2013-12-06 MED ORDER — TECHNETIUM TC 99M SESTAMIBI GENERIC - CARDIOLITE
11.0000 | Freq: Once | INTRAVENOUS | Status: AC | PRN
  Administered 2013-12-06: 11 via INTRAVENOUS

## 2013-12-06 MED ORDER — TECHNETIUM TC 99M SESTAMIBI GENERIC - CARDIOLITE
33.0000 | Freq: Once | INTRAVENOUS | Status: AC | PRN
  Administered 2013-12-06: 33 via INTRAVENOUS

## 2013-12-06 MED ORDER — REGADENOSON 0.4 MG/5ML IV SOLN
0.4000 mg | Freq: Once | INTRAVENOUS | Status: AC
  Administered 2013-12-06: 0.4 mg via INTRAVENOUS

## 2013-12-06 NOTE — Progress Notes (Signed)
[facility] [address] [place], [place] [address] [phone]    Cardiology Nuclear Med Study  [name] is a 58 y.o. male     MRN : [id number]     DOB: June 18, 1955  Procedure Date: 12/06/2013  Nuclear Med Background Indication for Stress Test:  Evaluation for Ischemia and Unable to do GXT History:  CAD, Cath, Stent (per pt.), hx. seizures Cardiac Risk Factors: RBBB and Smoker  Symptoms:  Chest Pain (last date of chest discomfort was one week ago)   Nuclear Pre-Procedure Caffeine/Decaff Intake:  None NPO After: 9:00pm   Lungs:  clear O2 Sat: 94% on room air. IV 0.9% NS with Angio Cath:  22g  IV Site: R Hand  IV Started by:  Crissie Figures, RN  Chest Size (in):  48 Cup Size: n/a  Height: 6\' 1"  (1.854 m)  Weight:  210 lb (95.255 kg)  BMI:  Body mass index is 27.71 kg/(m^2). Tech Comments:  N/A    Nuclear Med Study 1 or 2 day study: 1 day  Stress Test Type:  Lexiscan  Reading MD: N/A  Order Authorizing Provider:  Minus Breeding, MD  Resting Radionuclide: Technetium 76m Sestamibi  Resting Radionuclide Dose: 11.0 mCi   Stress Radionuclide:  Technetium 9m Sestamibi  Stress Radionuclide Dose: 33.0 mCi           Stress Protocol Rest HR: 68 Stress HR: 101  Rest BP: 135/78 Stress BP: 128/94  Exercise Time (min): n/a METS: n/a           Dose of Adenosine (mg):  n/a Dose of Lexiscan: 0.4 mg  Dose of Atropine (mg): n/a Dose of Dobutamine: n/a mcg/kg/min (at max HR)  Stress Test Technologist: Glade Lloyd, BS-ES  Nuclear Technologist:  Vedia Pereyra, CNMT     Rest Procedure:  Myocardial perfusion imaging was performed at rest 45 minutes following the intravenous administration of Technetium 63m Sestamibi. Rest ECG: NSR - Normal EKG  Stress Procedure:  The patient received IV Lexiscan 0.4 mg over 15-seconds.  Technetium 107m Sestamibi injected at 30-seconds.  Quantitative spect images were obtained after a 45 minute delay.  During the  infusion of Lexiscan the patient complained of throat tightness which resolved in recovery.  Stress ECG: No significant change from baseline ECG  QPS Raw Data Images:  Mild diaphragmatic attenuation.  Normal left ventricular size. Stress Images:  There is decreased uptake in the inferior wall. Rest Images:  There is decreased uptake in the inferior wall. Subtraction (SDS):  There is a fixed inferior defect that is most consistent with diaphragmatic attenuation. Transient Ischemic Dilatation (Normal <1.22):  1.02 Lung/Heart Ratio (Normal <0.45):  0.26  Quantitative Gated Spect Images QGS EDV:  133 ml QGS ESV:  60 ml  Impression Exercise Capacity:  Lexiscan with no exercise. BP Response:  Normal blood pressure response. Clinical Symptoms:  Throat tightness ECG Impression:  No significant ST segment change suggestive of ischemia. Comparison with Prior Nuclear Study: No significant change from previous study  Overall Impression:  Low risk stress nuclear study Fixed defect in the inferior wall most consistent with diaphragmatic attenuation given normal LVF in the inferior wall on rest images.  LV Ejection Fraction: 55%.  LV Wall Motion:  NL LV Function; NL Wall Motion  Signed: Fransico Him, MD [facility]

## 2013-12-07 ENCOUNTER — Other Ambulatory Visit: Payer: Self-pay | Admitting: *Deleted

## 2014-01-09 ENCOUNTER — Telehealth: Payer: Self-pay | Admitting: *Deleted

## 2014-01-09 NOTE — Telephone Encounter (Signed)
Patient woke up this morning with left sided neck and chest pain. States that pain is constant and doesn't worsen with activity. He has a strong family history of heart disease. He also has had a white bump on his tongue for a few days and is having problems with his teeth. He thinks this may be related to his neck and chest pain. Patient states that he is very anxious and that he isn't sure if he has a way to get to our office for an evaluation. Advised to call 911 and have EMS evaluate him and transport him to ER if necessary.

## 2014-01-20 DIAGNOSIS — R569 Unspecified convulsions: Secondary | ICD-10-CM | POA: Diagnosis not present

## 2014-02-12 ENCOUNTER — Ambulatory Visit (INDEPENDENT_AMBULATORY_CARE_PROVIDER_SITE_OTHER): Payer: Medicare Other | Admitting: Family Medicine

## 2014-02-12 ENCOUNTER — Encounter: Payer: Self-pay | Admitting: Family Medicine

## 2014-02-12 VITALS — BP 138/89 | HR 105 | Temp 97.3°F | Ht 70.0 in | Wt 208.4 lb

## 2014-02-12 DIAGNOSIS — Z23 Encounter for immunization: Secondary | ICD-10-CM | POA: Diagnosis not present

## 2014-02-12 DIAGNOSIS — F411 Generalized anxiety disorder: Secondary | ICD-10-CM

## 2014-02-12 DIAGNOSIS — I2510 Atherosclerotic heart disease of native coronary artery without angina pectoris: Secondary | ICD-10-CM | POA: Diagnosis not present

## 2014-02-12 DIAGNOSIS — F17200 Nicotine dependence, unspecified, uncomplicated: Secondary | ICD-10-CM | POA: Diagnosis not present

## 2014-02-12 DIAGNOSIS — F419 Anxiety disorder, unspecified: Secondary | ICD-10-CM | POA: Diagnosis not present

## 2014-02-12 DIAGNOSIS — R079 Chest pain, unspecified: Secondary | ICD-10-CM | POA: Diagnosis not present

## 2014-02-12 DIAGNOSIS — Z79899 Other long term (current) drug therapy: Secondary | ICD-10-CM | POA: Diagnosis not present

## 2014-02-12 DIAGNOSIS — M940 Chondrocostal junction syndrome [Tietze]: Secondary | ICD-10-CM | POA: Diagnosis not present

## 2014-02-12 DIAGNOSIS — Z7982 Long term (current) use of aspirin: Secondary | ICD-10-CM | POA: Diagnosis not present

## 2014-02-12 DIAGNOSIS — I10 Essential (primary) hypertension: Secondary | ICD-10-CM | POA: Diagnosis not present

## 2014-02-12 DIAGNOSIS — R42 Dizziness and giddiness: Secondary | ICD-10-CM | POA: Diagnosis not present

## 2014-02-12 DIAGNOSIS — R0602 Shortness of breath: Secondary | ICD-10-CM | POA: Diagnosis not present

## 2014-02-12 MED ORDER — ALPRAZOLAM 0.5 MG PO TABS: 0.5000 mg | ORAL_TABLET | Freq: Two times a day (BID) | ORAL | Status: DC | PRN

## 2014-02-12 MED ORDER — SERTRALINE HCL 50 MG PO TABS: 50.0000 mg | ORAL_TABLET | Freq: Every day | ORAL | Status: DC

## 2014-02-12 NOTE — Progress Notes (Signed)
   Subjective:    Patient ID: [name], male    DOB: 07/20/1955, 58 y.o.   MRN: [id number]  HPI He is having anxiety and he states he worries about his finances. He denies depression.    Review of Systems  Constitutional: Negative for fever.  HENT: Negative for ear pain.   Eyes: Negative for discharge.  Respiratory: Negative for cough.   Cardiovascular: Negative for chest pain.  Gastrointestinal: Negative for abdominal distention.  Endocrine: Negative for polyuria.  Genitourinary: Negative for difficulty urinating.  Musculoskeletal: Negative for gait problem and neck pain.  Skin: Negative for color change and rash.  Neurological: Negative for speech difficulty and headaches.  Psychiatric/Behavioral: Positive for agitation.       Objective:    BP 138/89  Pulse 105  Temp(Src) 97.3 F (36.3 C) (Oral)  Ht 5\' 10"  (1.778 m)  Wt 208 lb 6.4 oz (94.53 kg)  BMI 29.90 kg/m2 Physical Exam  Constitutional: He is oriented to person, place, and time. He appears well-developed and well-nourished.  HENT:  Head: Normocephalic and atraumatic.  Mouth/Throat: Oropharynx is clear and moist.  Eyes: Pupils are equal, round, and reactive to light.  Neck: Normal range of motion. Neck supple.  Cardiovascular: Normal rate and regular rhythm.   No murmur heard. Pulmonary/Chest: Effort normal and breath sounds normal.  Abdominal: Soft. Bowel sounds are normal. There is no tenderness.  Neurological: He is alert and oriented to person, place, and time.  Skin: Skin is warm and dry.  Psychiatric: He has a normal mood and affect.          Assessment & Plan:     ICD-9-CM ICD-10-CM   1. GAD (generalized anxiety disorder) 300.02 F41.1 ALPRAZolam (XANAX) 0.5 MG tablet     Return in about 3 months (around 05/15/2014).  Lysbeth Penner FNP

## 2014-03-12 ENCOUNTER — Encounter: Payer: Self-pay | Admitting: *Deleted

## 2014-03-28 ENCOUNTER — Encounter: Payer: Self-pay | Admitting: *Deleted

## 2014-04-05 NOTE — Telephone Encounter (Signed)
Close encounter 

## 2014-05-16 ENCOUNTER — Ambulatory Visit: Payer: Medicare Other | Admitting: Family

## 2014-05-16 ENCOUNTER — Ambulatory Visit: Payer: Medicare Other | Admitting: Family Medicine

## 2014-06-05 ENCOUNTER — Other Ambulatory Visit: Payer: Self-pay | Admitting: Family Medicine

## 2014-06-12 ENCOUNTER — Telehealth: Payer: Self-pay | Admitting: Family Medicine

## 2014-07-16 ENCOUNTER — Encounter: Payer: Self-pay | Admitting: Family Medicine

## 2014-07-16 ENCOUNTER — Ambulatory Visit (INDEPENDENT_AMBULATORY_CARE_PROVIDER_SITE_OTHER): Payer: Medicare Other | Admitting: Family Medicine

## 2014-07-16 VITALS — BP 149/87 | HR 75 | Temp 97.1°F | Ht 70.0 in | Wt 221.6 lb

## 2014-07-16 DIAGNOSIS — F411 Generalized anxiety disorder: Secondary | ICD-10-CM

## 2014-07-16 DIAGNOSIS — M792 Neuralgia and neuritis, unspecified: Secondary | ICD-10-CM | POA: Diagnosis not present

## 2014-07-16 MED ORDER — PREDNISONE 20 MG PO TABS: ORAL_TABLET | ORAL | Status: DC

## 2014-07-16 MED ORDER — SERTRALINE HCL 50 MG PO TABS: 50.0000 mg | ORAL_TABLET | Freq: Every day | ORAL | Status: DC

## 2014-07-16 MED ORDER — ALPRAZOLAM 0.5 MG PO TABS: 0.5000 mg | ORAL_TABLET | Freq: Two times a day (BID) | ORAL | Status: DC | PRN

## 2014-07-16 MED ORDER — DIVALPROEX SODIUM 250 MG PO DR TAB: 250.0000 mg | DELAYED_RELEASE_TABLET | Freq: Two times a day (BID) | ORAL | Status: DC

## 2014-07-16 NOTE — Progress Notes (Signed)
Subjective:  Patient ID: [name], male    DOB: 1956/01/04  Age: 59 y.o. MRN: [id number]  CC: Back Pain   HPI [name] presents for pain at the upper back and the right. Onset 1 month ago. Slowly increasing to the point of being quite uncomfortable. Trapezius border pain on the right is rather mild. His primary symptom is actually numbness and tingling in the right hand. The fingers are numb at theat the upper back and the right. Onset 1 month ago. Slowly increasing to the point of being quite uncomfortable. Trapezius border this tip and tingle at the DIP but are normal at the palms. He does not have any injury. He does have a long history of cerebral palsy and this has not previously caused the current symptoms.  Patient has been mildly mentally disabled and depressed for many years this leads to accompanying diagnosis of anxiety. He has been asked that are responding adequately to current medication. Dad is due to be renewed today.  History Seyed has a past medical history of Hypertension; Seizures; Coronary artery disease; Scoliosis; Cerebral palsy; Depression; and GERD (gastroesophageal reflux disease).   He has past surgical history that includes Coronary angioplasty with stent.   His family history includes CAD in his brother; CAD (age of onset: 31) in his father.He reports that he has been smoking Cigarettes.  He has been smoking about 1.00 pack per day. He does not have any smokeless tobacco history on file. He reports that he drinks alcohol. He reports that he does not use illicit drugs.   Current Outpatient Prescriptions on File Prior to Visit  Medication Sig Dispense Refill  . aspirin EC 81 MG tablet Take 1 tablet (81 mg total) by mouth daily. 90 tablet 0  . omeprazole (PRILOSEC) 20 MG capsule TAKE (1) CAPSULE TWICE DAILY BEFORE A MEAL 60 capsule 1   No current facility-administered medications on file prior to visit.    ROS Review of Systems  Constitutional:  Negative for fever, chills, diaphoresis and unexpected weight change.  HENT: Negative for congestion, hearing loss, rhinorrhea, sore throat and trouble swallowing.   Respiratory: Negative for cough, chest tightness, shortness of breath and wheezing.   Gastrointestinal: Negative for nausea, vomiting, abdominal pain, diarrhea, constipation and abdominal distention.  Endocrine: Negative for cold intolerance and heat intolerance.  Genitourinary: Negative for dysuria, hematuria and flank pain.  Musculoskeletal: Negative for joint swelling and arthralgias.  Skin: Negative for rash.  Neurological: Negative for dizziness and headaches.  Psychiatric/Behavioral: Negative for dysphoric mood, decreased concentration and agitation. The patient is not nervous/anxious.     Objective:  BP 149/87 mmHg  Pulse 75  Temp(Src) 97.1 F (36.2 C) (Oral)  Ht 5\' 10"  (1.778 m)  Wt 221 lb 9.6 oz (100.517 kg)  BMI 31.80 kg/m2  BP Readings from Last 3 Encounters:  07/16/14 149/87  02/12/14 138/89  12/06/13 135/78    Wt Readings from Last 3 Encounters:  07/16/14 221 lb 9.6 oz (100.517 kg)  02/12/14 208 lb 6.4 oz (94.53 kg)  12/06/13 210 lb (95.255 kg)     Physical Exam  Constitutional: He appears well-developed and well-nourished.  HENT:  Head: Normocephalic and atraumatic.  Right Ear: Tympanic membrane and external ear normal. No decreased hearing is noted.  Left Ear: Tympanic membrane and external ear normal. No decreased hearing is noted.  Mouth/Throat: No oropharyngeal exudate or posterior oropharyngeal erythema.  Eyes: Pupils are equal, round, and reactive to light.  Neck: Normal  range of motion. Neck supple.  Cardiovascular: Normal rate and regular rhythm.   No murmur heard. Pulmonary/Chest: Breath sounds normal. No respiratory distress.  Abdominal: Soft. Bowel sounds are normal. He exhibits no mass. There is no tenderness.  Neurological:  There is numbness of the posterior region of the  fingers of the right hand. Sensation is grossly intact for light touch at the PIP regions. There is full range of motion. Tinel and Phalen signs are negative. Full range of motion at right elbow and shoulder. Mild to moderate tenderness at the superior right trapezius border. There is some diminished range of motion at the neck particularly for rotation toward the right with tenderness being noted at the origin of the left SCM muscle  Vitals reviewed.   Lab Results  Component Value Date   HGBA1C  09/01/2009    5.3 (NOTE)                                                                       According to the ADA Clinical Practice Recommendations for 2011, when HbA1c is used as a screening test:   >=6.5%   Diagnostic of Diabetes Mellitus           (if abnormal result  is confirmed)  5.7-6.4%   Increased risk of developing Diabetes Mellitus  References:Diagnosis and Classification of Diabetes Mellitus,Diabetes MDYJ,0929,57(MBBUY 1):S62-S69 and Standards of Medical Care in         Diabetes - 2011,Diabetes Care,2011,34  (Suppl 1):S11-S61.   HGBA1C  05/31/2007    5.2 (NOTE)   The ADA recommends the following therapeutic goals for glycemic   control related to Hgb A1C measurement:   Goal of Therapy:   < 7.0% Hgb A1C   Action Suggested:  > 8.0% Hgb A1C   Ref:  Diabetes Care, 22, Suppl. 1, 1999    Lab Results  Component Value Date   WBC 10.2 06/23/2013   HGB 16.3 06/23/2013   HCT 49.3 06/23/2013   PLT 192 12/05/2011   GLUCOSE 95 06/23/2013   CHOL 164 06/23/2013   TRIG 295* 06/23/2013   HDL 31* 06/23/2013   LDLCALC 74 06/23/2013   ALT 12 06/23/2013   AST 19 06/23/2013   NA 139 06/23/2013   K 4.1 06/23/2013   CL 97 06/23/2013   CREATININE 0.76 06/23/2013   BUN 9 06/23/2013   CO2 26 06/23/2013   TSH 1.200 06/23/2013   PSA 0.3 06/23/2013   INR 1.17 09/01/2009   HGBA1C  09/01/2009    5.3 (NOTE)                                                                       According to the ADA  Clinical Practice Recommendations for 2011, when HbA1c is used as a screening test:   >=6.5%   Diagnostic of Diabetes Mellitus           (if abnormal result  is confirmed)  5.7-6.4%   Increased risk of developing Diabetes  Mellitus  References:Diagnosis and Classification of Diabetes Mellitus,Diabetes EPPI,9518,84(ZYSAY 1):S62-S69 and Standards of Medical Care in         Diabetes - 2011,Diabetes TKZS,0109,32  (Suppl 1):S11-S61.    Ct Chest W Contrast  08/22/2013   CLINICAL DATA:  Hemoptysis.  Chronic tobacco use.  EXAM: CT CHEST WITH CONTRAST  TECHNIQUE: Multidetector CT imaging of the chest was performed during intravenous contrast administration.  CONTRAST:  18mL OMNIPAQUE IOHEXOL 300 MG/ML  SOLN  COMPARISON:  01/04/2011; 08/16/2013  FINDINGS: Observed mediastinal lymph nodes are not pathologically enlarged by size criteria.  Proximal left anterior descending and left circumflex coronary artery atherosclerotic calcification noted. There is also calcification in the right coronary artery. There appears to be mucus in the trachea on images 9 through 12 of series 4. Mild airway thickening is observed bilaterally. Breathing motion artifact is present on today's exam.  No lung mass is identified. There is degenerative right sternoclavicular arthropathy.  Thoracic spondylosis noted. Calcification noted in the intervertebral disc at T9-10.  IMPRESSION: 1. Airway thickening is present, suggesting bronchitis or reactive airways disease. There is also a small amount of mucus in the trachea. 2. A specific cause for hemoptysis is not identified. 3. Coronary artery atherosclerosis. 4. Incidental findings include thoracic spondylosis and degenerative right sternoclavicular arthropathy.   Electronically Signed   By: Sherryl Barters M.D.   On: 08/22/2013 15:39   Ct Maxillofacial W/cm  08/22/2013   CLINICAL DATA:  Lesion under the tongue.  Tobacco abuse.  EXAM: CT MAXILLOFACIAL WITH CONTRAST  TECHNIQUE: Multidetector CT  imaging of the maxillofacial structures was performed with intravenous contrast. Multiplanar CT image reconstructions were also generated. A small metallic BB was placed on the right temple in order to reliably differentiate right from left.  CONTRAST:  69mL OMNIPAQUE IOHEXOL 300 MG/ML  SOLN  COMPARISON:  None.  FINDINGS: There is no evidence of fracture. There is no evidence of inflammatory sinus disease. There are a few retention cysts along the anterior aspect of the right maxillary sinus. Nasal passages on the right are widely patent. There may be a polyp, focal mucosal inflammation or cyst in the nasal passages on the left. No fluid in the middle ears or mastoids.  Orbital contents are normal.  Both parotid glands are normal. Both submandibular glands are normal. I do not identify any mucosal or submucosal lesion, with specific attention to the under surface of the tongue.  No abnormal lymph nodes are seen.  Patient has extensive dental and periodontal disease throughout.  IMPRESSION: I cannot identify any mucosal or submucosal lesion, with specific attention to the region of the under surface of the tongue. No enlarged lymph nodes are present in the region. This study was ordered as a facial CT rather than a neck CT, and it does not evaluate the entire neck for lymphadenopathy.  Advanced dental and periodontal disease.   Electronically Signed   By: Nelson Chimes M.D.   On: 08/22/2013 13:36    Assessment & Plan:   Jakevious was seen today for back pain.  Diagnoses and all orders for this visit:  Neuritis of upper extremity  GAD (generalized anxiety disorder) Orders: -     ALPRAZolam (XANAX) 0.5 MG tablet; Take 1 tablet (0.5 mg total) by mouth 2 (two) times daily as needed for anxiety.  Other orders -     predniSONE (DELTASONE) 20 MG tablet; Take one with breakfast and one with lunch for 1 week. Then take one daily at breakfast for  1 week. -     divalproex (DEPAKOTE) 250 MG DR tablet; Take 1 tablet  (250 mg total) by mouth 2 (two) times daily. -     sertraline (ZOLOFT) 50 MG tablet; Take 1 tablet (50 mg total) by mouth daily.   I have changed Mr. Doss's divalproex. I am also having him start on predniSONE. Additionally, I am having him maintain his aspirin EC, omeprazole, ALPRAZolam, and sertraline.  Meds ordered this encounter  Medications  . predniSONE (DELTASONE) 20 MG tablet    Sig: Take one with breakfast and one with lunch for 1 week. Then take one daily at breakfast for 1 week.    Dispense:  21 tablet    Refill:  0  . ALPRAZolam (XANAX) 0.5 MG tablet    Sig: Take 1 tablet (0.5 mg total) by mouth 2 (two) times daily as needed for anxiety.    Dispense:  30 tablet    Refill:  3  . divalproex (DEPAKOTE) 250 MG DR tablet    Sig: Take 1 tablet (250 mg total) by mouth 2 (two) times daily.    Dispense:  60 tablet    Refill:  5  . sertraline (ZOLOFT) 50 MG tablet    Sig: Take 1 tablet (50 mg total) by mouth daily.    Dispense:  30 tablet    Refill:  11     Follow-up: Return in about 1 month (around 08/16/2014).  [name], M.D.

## 2014-07-30 ENCOUNTER — Ambulatory Visit: Payer: Medicare Other | Admitting: Family Medicine

## 2014-08-01 ENCOUNTER — Other Ambulatory Visit: Payer: Self-pay | Admitting: Family Medicine

## 2014-08-02 NOTE — Telephone Encounter (Signed)
Last seen 07/16/14 Dr Livia Snellen

## 2014-08-03 ENCOUNTER — Other Ambulatory Visit: Payer: Self-pay | Admitting: *Deleted

## 2014-08-03 MED ORDER — PREDNISONE 20 MG PO TABS: ORAL_TABLET | ORAL | Status: DC

## 2014-08-17 ENCOUNTER — Ambulatory Visit (INDEPENDENT_AMBULATORY_CARE_PROVIDER_SITE_OTHER): Payer: Medicare Other | Admitting: Family Medicine

## 2014-08-17 ENCOUNTER — Encounter: Payer: Self-pay | Admitting: Family Medicine

## 2014-08-17 VITALS — BP 142/86 | HR 76 | Temp 98.2°F | Wt 221.0 lb

## 2014-08-17 DIAGNOSIS — M792 Neuralgia and neuritis, unspecified: Secondary | ICD-10-CM | POA: Diagnosis not present

## 2014-08-17 DIAGNOSIS — B079 Viral wart, unspecified: Secondary | ICD-10-CM

## 2014-08-17 NOTE — Progress Notes (Signed)
Subjective:  Patient ID: [name], male    DOB: [birth year]  Age: 59 y.o. MRN: [id number]  CC: NEURITIS   HPI [name] presents for follow-up of the neck and arm pain. Note from 2 weeks ago reviewed. At this time he states the pain is worst at the left superior margin of the trapezius at the base of the neck. He also still has numbness and tingling in the fingertips of the right hand. He has finished the prednisone and has mild-to-moderate improvement.  I reviewed a note from his psychiatrist. He is concerned about drug interactions with the Xanax. He also expressed concern about the divalproex. That note is attached  History [name] has a past medical history of Hypertension; Seizures; Coronary artery disease; Scoliosis; Cerebral palsy; Depression; and GERD (gastroesophageal reflux disease).   He has past surgical history that includes Coronary angioplasty with stent.   His family history includes CAD in his brother; CAD (age of onset: 44) in his father.He reports that he has been smoking Cigarettes.  He has been smoking about 1.00 pack per day. He does not have any smokeless tobacco history on file. He reports that he drinks alcohol. He reports that he does not use illicit drugs.  Current Outpatient Prescriptions on File Prior to Visit  Medication Sig Dispense Refill  . ALPRAZolam (XANAX) 0.5 MG tablet Take 1 tablet (0.5 mg total) by mouth 2 (two) times daily as needed for anxiety. 30 tablet 3  . aspirin EC 81 MG tablet Take 1 tablet (81 mg total) by mouth daily. 90 tablet 0  . divalproex (DEPAKOTE) 250 MG DR tablet Take 1 tablet (250 mg total) by mouth 2 (two) times daily. 60 tablet 5  . omeprazole (PRILOSEC) 20 MG capsule TAKE (1) CAPSULE TWICE DAILY BEFORE A MEAL 60 capsule 1   No current facility-administered medications on file prior to visit.    ROS Review of Systems  Constitutional: Negative for fever, chills, diaphoresis and unexpected weight change.  HENT:  Negative for congestion, hearing loss, rhinorrhea, sore throat and trouble swallowing.   Respiratory: Negative for cough, chest tightness, shortness of breath and wheezing.   Gastrointestinal: Negative for nausea, vomiting, abdominal pain, diarrhea, constipation and abdominal distention.  Endocrine: Negative for cold intolerance and heat intolerance.  Genitourinary: Negative for dysuria, hematuria and flank pain.  Musculoskeletal: Negative for joint swelling and arthralgias.  Skin: Negative for rash.  Neurological: Negative for dizziness and headaches.  Psychiatric/Behavioral: Negative for dysphoric mood, decreased concentration and agitation. The patient is not nervous/anxious.     Objective:  BP 142/86 mmHg  Pulse 76  Temp(Src) 98.2 F (36.8 C) (Oral)  Wt 221 lb (100.245 kg)  BP Readings from Last 3 Encounters:  08/17/14 142/86  07/16/14 149/87  02/12/14 138/89    Wt Readings from Last 3 Encounters:  08/17/14 221 lb (100.245 kg)  07/16/14 221 lb 9.6 oz (100.517 kg)  02/12/14 208 lb 6.4 oz (94.53 kg)     Physical Exam  Constitutional: He appears well-developed and well-nourished.  HENT:  Head: Normocephalic and atraumatic.  Right Ear: Tympanic membrane and external ear normal. No decreased hearing is noted.  Left Ear: Tympanic membrane and external ear normal. No decreased hearing is noted.  Mouth/Throat: No oropharyngeal exudate or posterior oropharyngeal erythema.  Eyes: Pupils are equal, round, and reactive to light.  Neck: Normal range of motion. Neck supple.  Cardiovascular: Normal rate and regular rhythm.   No murmur heard. Pulmonary/Chest: Breath sounds normal.  No respiratory distress.  Abdominal: Soft. Bowel sounds are normal. He exhibits no mass. There is no tenderness.  Musculoskeletal: He exhibits tenderness.  Vitals reviewed.   Lab Results  Component Value Date   HGBA1C  09/01/2009    5.3 (NOTE)                                                                        According to the ADA Clinical Practice Recommendations for 2011, when HbA1c is used as a screening test:   >=6.5%   Diagnostic of Diabetes Mellitus           (if abnormal result  is confirmed)  5.7-6.4%   Increased risk of developing Diabetes Mellitus  References:Diagnosis and Classification of Diabetes Mellitus,Diabetes JOAC,1660,63(KZSWF 1):S62-S69 and Standards of Medical Care in         Diabetes - 2011,Diabetes Care,2011,34  (Suppl 1):S11-S61.   HGBA1C  05/31/2007    5.2 (NOTE)   The ADA recommends the following therapeutic goals for glycemic   control related to Hgb A1C measurement:   Goal of Therapy:   < 7.0% Hgb A1C   Action Suggested:  > 8.0% Hgb A1C   Ref:  Diabetes Care, 22, Suppl. 1, 1999    Lab Results  Component Value Date   WBC 10.2 06/23/2013   HGB 16.3 06/23/2013   HCT 49.3 06/23/2013   PLT 192 12/05/2011   GLUCOSE 95 06/23/2013   CHOL 164 06/23/2013   TRIG 295* 06/23/2013   HDL 31* 06/23/2013   LDLCALC 74 06/23/2013   ALT 12 06/23/2013   AST 19 06/23/2013   NA 139 06/23/2013   K 4.1 06/23/2013   CL 97 06/23/2013   CREATININE 0.76 06/23/2013   BUN 9 06/23/2013   CO2 26 06/23/2013   TSH 1.200 06/23/2013   PSA 0.3 06/23/2013   INR 1.17 09/01/2009   HGBA1C  09/01/2009    5.3 (NOTE)                                                                       According to the ADA Clinical Practice Recommendations for 2011, when HbA1c is used as a screening test:   >=6.5%   Diagnostic of Diabetes Mellitus           (if abnormal result  is confirmed)  5.7-6.4%   Increased risk of developing Diabetes Mellitus  References:Diagnosis and Classification of Diabetes Mellitus,Diabetes UXNA,3557,32(KGURK 1):S62-S69 and Standards of Medical Care in         Diabetes - 2011,Diabetes Care,2011,34  (Suppl 1):S11-S61.    Ct Chest W Contrast  08/22/2013   CLINICAL DATA:  Hemoptysis.  Chronic tobacco use.  EXAM: CT CHEST WITH CONTRAST  TECHNIQUE: Multidetector CT imaging of the  chest was performed during intravenous contrast administration.  CONTRAST:  110mL OMNIPAQUE IOHEXOL 300 MG/ML  SOLN  COMPARISON:  01/04/2011; 08/16/2013  FINDINGS: Observed mediastinal lymph nodes are not pathologically enlarged by size criteria.  Proximal left anterior descending  and left circumflex coronary artery atherosclerotic calcification noted. There is also calcification in the right coronary artery. There appears to be mucus in the trachea on images 9 through 12 of series 4. Mild airway thickening is observed bilaterally. Breathing motion artifact is present on today's exam.  No lung mass is identified. There is degenerative right sternoclavicular arthropathy.  Thoracic spondylosis noted. Calcification noted in the intervertebral disc at T9-10.  IMPRESSION: 1. Airway thickening is present, suggesting bronchitis or reactive airways disease. There is also a small amount of mucus in the trachea. 2. A specific cause for hemoptysis is not identified. 3. Coronary artery atherosclerosis. 4. Incidental findings include thoracic spondylosis and degenerative right sternoclavicular arthropathy.   Electronically Signed   By: Sherryl Barters M.D.   On: 08/22/2013 15:39   Ct Maxillofacial W/cm  08/22/2013   CLINICAL DATA:  Lesion under the tongue.  Tobacco abuse.  EXAM: CT MAXILLOFACIAL WITH CONTRAST  TECHNIQUE: Multidetector CT imaging of the maxillofacial structures was performed with intravenous contrast. Multiplanar CT image reconstructions were also generated. A small metallic BB was placed on the right temple in order to reliably differentiate right from left.  CONTRAST:  59mL OMNIPAQUE IOHEXOL 300 MG/ML  SOLN  COMPARISON:  None.  FINDINGS: There is no evidence of fracture. There is no evidence of inflammatory sinus disease. There are a few retention cysts along the anterior aspect of the right maxillary sinus. Nasal passages on the right are widely patent. There may be a polyp, focal mucosal inflammation or  cyst in the nasal passages on the left. No fluid in the middle ears or mastoids.  Orbital contents are normal.  Both parotid glands are normal. Both submandibular glands are normal. I do not identify any mucosal or submucosal lesion, with specific attention to the under surface of the tongue.  No abnormal lymph nodes are seen.  Patient has extensive dental and periodontal disease throughout.  IMPRESSION: I cannot identify any mucosal or submucosal lesion, with specific attention to the region of the under surface of the tongue. No enlarged lymph nodes are present in the region. This study was ordered as a facial CT rather than a neck CT, and it does not evaluate the entire neck for lymphadenopathy.  Advanced dental and periodontal disease.   Electronically Signed   By: Nelson Chimes M.D.   On: 08/22/2013 13:36    Assessment & Plan:   Eder was seen today for neuritis.  Diagnoses and all orders for this visit:  Neuritis of upper extremity Orders: -     NCV with EMG(electromyography); Future  Wart  I have discontinued [name]'s predniSONE. I am also having him maintain his aspirin EC, omeprazole, ALPRAZolam, divalproex, busPIRone, and sertraline.  Meds ordered this encounter  Medications  . busPIRone (BUSPAR) 10 MG tablet    Sig: Take 1 tablet by mouth 2 (two) times daily.  . sertraline (ZOLOFT) 100 MG tablet    Sig: Take 1 tablet by mouth daily.   We discussed discontinuing the prednisone. Additionally he will be seeing his psychiatrist in the future for his psychoactive drugs including alprazolam divalproex buspirone and sertraline. I encouraged him to obtain early follow-up with his psychiatrist.  Follow-up: Return in about 6 weeks (around 09/28/2014) for neuritis.  [name], M.D.

## 2014-09-10 ENCOUNTER — Ambulatory Visit (INDEPENDENT_AMBULATORY_CARE_PROVIDER_SITE_OTHER): Payer: Medicare Other | Admitting: Nurse Practitioner

## 2014-09-10 ENCOUNTER — Encounter: Payer: Self-pay | Admitting: Nurse Practitioner

## 2014-09-10 VITALS — BP 131/86 | HR 103 | Temp 99.5°F | Ht 70.0 in | Wt 228.2 lb

## 2014-09-10 DIAGNOSIS — J301 Allergic rhinitis due to pollen: Secondary | ICD-10-CM

## 2014-09-10 MED ORDER — FLUTICASONE PROPIONATE 50 MCG/ACT NA SUSP: 2.0000 | Freq: Every day | NASAL | Status: DC

## 2014-09-10 MED ORDER — CETIRIZINE HCL 10 MG PO TABS: 10.0000 mg | ORAL_TABLET | Freq: Every day | ORAL | Status: DC

## 2014-09-10 NOTE — Patient Instructions (Signed)

## 2014-09-10 NOTE — Progress Notes (Signed)
   Subjective:    Patient ID: [name], male    DOB: 04/05/1956, 59 y.o.   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 He is on no allergy meds. Denies fever    Review of Systems  Constitutional: Negative.   HENT: Positive for congestion.   Respiratory: Positive for cough (slight).   Cardiovascular: Negative.   Genitourinary: Negative.   Neurological: Negative.   Psychiatric/Behavioral: Negative.   All other systems reviewed and are negative.      Objective:   Physical Exam  Constitutional: He appears well-developed and well-nourished.  HENT:  Right Ear: Hearing, tympanic membrane, external ear and ear canal normal.  Left Ear: Hearing, tympanic membrane, external ear and ear canal normal.  Nose: Mucosal edema and rhinorrhea present. Right sinus exhibits no maxillary sinus tenderness and no frontal sinus tenderness. Left sinus exhibits no maxillary sinus tenderness and no frontal sinus tenderness.  Mouth/Throat: Uvula is midline, oropharynx is clear and moist and mucous membranes are normal.  Eyes: Pupils are equal, round, and reactive to light.  Neck: Normal range of motion. Neck supple.  Cardiovascular: Normal rate, regular rhythm and normal heart sounds.   Pulmonary/Chest: Effort normal and breath sounds normal.  Lymphadenopathy:    He has no cervical adenopathy.  Neurological: He is alert.  Skin: Skin is warm.  2cm scab on right elbow- no erythema no drainage  Psychiatric: He has a normal mood and affect. His behavior is normal. Judgment and thought content normal.   BP 131/86 mmHg  Pulse 103  Temp(Src) 99.5 F (37.5 C) (Oral)  Ht 5\' 10"  (1.778 m)  Wt 228 lb 3.2 oz (103.511 kg)  BMI 32.74 kg/m2        Assessment & Plan:   1. Allergic rhinitis due to pollen    Meds ordered this encounter  Medications  . fluticasone (FLONASE) 50 MCG/ACT nasal spray    Sig: Place 2  sprays into both nostrils daily.    Dispense:  16 g    Refill:  6    Order Specific Question:  Supervising Provider    Answer:  Chipper Herb [[id number]]  . cetirizine (ZYRTEC) 10 MG tablet    Sig: Take 1 tablet (10 mg total) by mouth daily.    Dispense:  30 tablet    Refill:  3    Order Specific Question:  Supervising Provider    Answer:  Joycelyn Man   Force fluids Rest rto prn  Mary-Margaret Hassell Done, FNP

## 2014-10-08 ENCOUNTER — Ambulatory Visit: Payer: Medicare Other | Admitting: Family Medicine

## 2014-11-12 ENCOUNTER — Encounter: Payer: Self-pay | Admitting: Family Medicine

## 2014-11-12 ENCOUNTER — Ambulatory Visit (INDEPENDENT_AMBULATORY_CARE_PROVIDER_SITE_OTHER): Payer: Medicare Other | Admitting: Family Medicine

## 2014-11-12 VITALS — BP 127/79 | HR 85 | Temp 98.3°F | Ht 70.0 in | Wt 226.0 lb

## 2014-11-12 DIAGNOSIS — H109 Unspecified conjunctivitis: Secondary | ICD-10-CM

## 2014-11-12 MED ORDER — SULFACETAMIDE-PREDNISOLONE 10-0.23 % OP SOLN: 1.0000 [drp] | OPHTHALMIC | Status: DC

## 2014-11-12 NOTE — Progress Notes (Signed)
Subjective:  Patient ID: BILLIE INTRIAGO, male    DOB: December 25, 1955  Age: 59 y.o. MRN: [id number]  CC: Eye Drainage   HPI Shahzain Kiester Madonia presents for 1 week of increasing drainage from the eyes. He denies any pain or vision changes. It is a white milky discharge from the medial epicanthus. It occurs primarily in the morning.  History Eragon has a past medical history of Hypertension; Seizures; Coronary artery disease; Scoliosis; Cerebral palsy; Depression; and GERD (gastroesophageal reflux disease).   He has past surgical history that includes Coronary angioplasty with stent.   His family history includes CAD in his brother; CAD (age of onset: 65) in his father.He reports that he has been smoking Cigarettes.  He has been smoking about 1.00 pack per day. He does not have any smokeless tobacco history on file. He reports that he drinks alcohol. He reports that he does not use illicit drugs.  Outpatient Prescriptions Prior to Visit  Medication Sig Dispense Refill  . ALPRAZolam (XANAX) 0.5 MG tablet Take 1 tablet (0.5 mg total) by mouth 2 (two) times daily as needed for anxiety. 30 tablet 3  . aspirin EC 81 MG tablet Take 1 tablet (81 mg total) by mouth daily. 90 tablet 0  . busPIRone (BUSPAR) 10 MG tablet Take 1 tablet by mouth 2 (two) times daily.    . cetirizine (ZYRTEC) 10 MG tablet Take 1 tablet (10 mg total) by mouth daily. 30 tablet 3  . divalproex (DEPAKOTE) 250 MG DR tablet Take 1 tablet (250 mg total) by mouth 2 (two) times daily. 60 tablet 5  . fluticasone (FLONASE) 50 MCG/ACT nasal spray Place 2 sprays into both nostrils daily. 16 g 6  . omeprazole (PRILOSEC) 20 MG capsule TAKE (1) CAPSULE TWICE DAILY BEFORE A MEAL 60 capsule 1  . sertraline (ZOLOFT) 100 MG tablet Take 1 tablet by mouth daily.     No facility-administered medications prior to visit.    ROS Review of Systems  Constitutional: Negative for fever, chills, activity change and appetite change.  HENT: Positive for  congestion. Negative for ear pain, facial swelling and rhinorrhea.   Eyes: Positive for redness and itching. Negative for photophobia, pain and visual disturbance.  Respiratory: Negative.     Objective:  BP 127/79 mmHg  Pulse 85  Temp(Src) 98.3 F (36.8 C) (Oral)  Ht 5\' 10"  (1.778 m)  Wt 226 lb (102.513 kg)  BMI 32.43 kg/m2  BP Readings from Last 3 Encounters:  11/12/14 127/79  09/10/14 131/86  08/17/14 142/86    Wt Readings from Last 3 Encounters:  11/12/14 226 lb (102.513 kg)  09/10/14 228 lb 3.2 oz (103.511 kg)  08/17/14 221 lb (100.245 kg)     Physical Exam  Constitutional: He appears well-developed. No distress.  HENT:  Head: Normocephalic and atraumatic.  Eyes: EOM are normal. Pupils are equal, round, and reactive to light. Right eye exhibits chemosis. Right eye exhibits no exudate and no hordeolum. No foreign body present in the right eye. Left eye exhibits chemosis and discharge. Left eye exhibits no exudate and no hordeolum. No foreign body present in the left eye. Right conjunctiva is injected. Right conjunctiva has no hemorrhage. Left conjunctiva is injected. Left conjunctiva has no hemorrhage.  Neck: Normal range of motion. Neck supple.  Cardiovascular: Normal rate and regular rhythm.   Pulmonary/Chest: Breath sounds normal. No respiratory distress.  Lymphadenopathy:    He has no cervical adenopathy.    Lab Results  Component Value Date  HGBA1C  09/01/2009    5.3 (NOTE)                                                                       According to the ADA Clinical Practice Recommendations for 2011, when HbA1c is used as a screening test:   >=6.5%   Diagnostic of Diabetes Mellitus           (if abnormal result  is confirmed)  5.7-6.4%   Increased risk of developing Diabetes Mellitus  References:Diagnosis and Classification of Diabetes Mellitus,Diabetes [id number],2536,64(QIHKV 1):S62-S69 and Standards of Medical Care in         Diabetes - 2011,Diabetes  QQVZ,5638,75  (Suppl 1):S11-S61.   HGBA1C  05/31/2007    5.2 (NOTE)   The ADA recommends the following therapeutic goals for glycemic   control related to Hgb A1C measurement:   Goal of Therapy:   < 7.0% Hgb A1C   Action Suggested:  > 8.0% Hgb A1C   Ref:  Diabetes Care, 22, Suppl. 1, 1999    Lab Results  Component Value Date   WBC 10.2 06/23/2013   HGB 16.3 06/23/2013   HCT 49.3 06/23/2013   PLT 192 12/05/2011   GLUCOSE 95 06/23/2013   CHOL 164 06/23/2013   TRIG 295* 06/23/2013   HDL 31* 06/23/2013   LDLCALC 74 06/23/2013   ALT 12 06/23/2013   AST 19 06/23/2013   NA 139 06/23/2013   K 4.1 06/23/2013   CL 97 06/23/2013   CREATININE 0.76 06/23/2013   BUN 9 06/23/2013   CO2 26 06/23/2013   TSH 1.200 06/23/2013   PSA 0.3 06/23/2013   INR 1.17 09/01/2009   HGBA1C  09/01/2009    5.3 (NOTE)                                                                       According to the ADA Clinical Practice Recommendations for 2011, when HbA1c is used as a screening test:   >=6.5%   Diagnostic of Diabetes Mellitus           (if abnormal result  is confirmed)  5.7-6.4%   Increased risk of developing Diabetes Mellitus  References:Diagnosis and Classification of Diabetes Mellitus,Diabetes IEPP,2951,88(CZYSA 1):S62-S69 and Standards of Medical Care in         Diabetes - 2011,Diabetes Care,2011,34  (Suppl 1):S11-S61.    Ct Chest W Contrast  08/22/2013   CLINICAL DATA:  Hemoptysis.  Chronic tobacco use.  EXAM: CT CHEST WITH CONTRAST  TECHNIQUE: Multidetector CT imaging of the chest was performed during intravenous contrast administration.  CONTRAST:  67mL OMNIPAQUE IOHEXOL 300 MG/ML  SOLN  COMPARISON:  01/04/2011; 08/16/2013  FINDINGS: Observed mediastinal lymph nodes are not pathologically enlarged by size criteria.  Proximal left anterior descending and left circumflex coronary artery atherosclerotic calcification noted. There is also calcification in the right coronary artery. There appears to be  mucus in the trachea on images 9 through 12 of series 4. Mild airway thickening  is observed bilaterally. Breathing motion artifact is present on today's exam.  No lung mass is identified. There is degenerative right sternoclavicular arthropathy.  Thoracic spondylosis noted. Calcification noted in the intervertebral disc at T9-10.  IMPRESSION: 1. Airway thickening is present, suggesting bronchitis or reactive airways disease. There is also a small amount of mucus in the trachea. 2. A specific cause for hemoptysis is not identified. 3. Coronary artery atherosclerosis. 4. Incidental findings include thoracic spondylosis and degenerative right sternoclavicular arthropathy.   Electronically Signed   By: Sherryl Barters M.D.   On: 08/22/2013 15:39   Ct Maxillofacial W/cm  08/22/2013   CLINICAL DATA:  Lesion under the tongue.  Tobacco abuse.  EXAM: CT MAXILLOFACIAL WITH CONTRAST  TECHNIQUE: Multidetector CT imaging of the maxillofacial structures was performed with intravenous contrast. Multiplanar CT image reconstructions were also generated. A small metallic BB was placed on the right temple in order to reliably differentiate right from left.  CONTRAST:  37mL OMNIPAQUE IOHEXOL 300 MG/ML  SOLN  COMPARISON:  None.  FINDINGS: There is no evidence of fracture. There is no evidence of inflammatory sinus disease. There are a few retention cysts along the anterior aspect of the right maxillary sinus. Nasal passages on the right are widely patent. There may be a polyp, focal mucosal inflammation or cyst in the nasal passages on the left. No fluid in the middle ears or mastoids.  Orbital contents are normal.  Both parotid glands are normal. Both submandibular glands are normal. I do not identify any mucosal or submucosal lesion, with specific attention to the under surface of the tongue.  No abnormal lymph nodes are seen.  Patient has extensive dental and periodontal disease throughout.  IMPRESSION: I cannot identify any  mucosal or submucosal lesion, with specific attention to the region of the under surface of the tongue. No enlarged lymph nodes are present in the region. This study was ordered as a facial CT rather than a neck CT, and it does not evaluate the entire neck for lymphadenopathy.  Advanced dental and periodontal disease.   Electronically Signed   By: Nelson Chimes M.D.   On: 08/22/2013 13:36    Assessment & Plan:   Louis was seen today for eye drainage.  Diagnoses and all orders for this visit:  Bilateral conjunctivitis  Other orders -     sulfacetamide-prednisoLONE (VASOCIDIN) 10-0.23 % ophthalmic solution; Place 1 drop into both eyes every 2 (two) hours while awake. For one week   I am having Mr. Boeve start on sulfacetamide-prednisoLONE. I am also having him maintain his aspirin EC, omeprazole, ALPRAZolam, divalproex, busPIRone, sertraline, fluticasone, and cetirizine.  Meds ordered this encounter  Medications  . sulfacetamide-prednisoLONE (VASOCIDIN) 10-0.23 % ophthalmic solution    Sig: Place 1 drop into both eyes every 2 (two) hours while awake. For one week    Dispense:  5 mL    Refill:  0     Follow-up: Return if symptoms worsen or fail to improve.  [name], M.D.

## 2015-01-18 ENCOUNTER — Ambulatory Visit (INDEPENDENT_AMBULATORY_CARE_PROVIDER_SITE_OTHER): Payer: Medicare Other | Admitting: Pediatrics

## 2015-01-18 ENCOUNTER — Encounter: Payer: Self-pay | Admitting: Pediatrics

## 2015-01-18 VITALS — BP 140/88 | HR 99 | Temp 100.6°F | Ht 70.0 in | Wt 220.0 lb

## 2015-01-18 DIAGNOSIS — R6889 Other general symptoms and signs: Secondary | ICD-10-CM | POA: Diagnosis not present

## 2015-01-18 LAB — POCT INFLUENZA A/B
Influenza A, POC: NEGATIVE
Influenza B, POC: NEGATIVE

## 2015-01-18 MED ORDER — ONDANSETRON 4 MG PO TBDP: 4.0000 mg | ORAL_TABLET | Freq: Three times a day (TID) | ORAL | Status: DC | PRN

## 2015-01-18 NOTE — Progress Notes (Signed)
Subjective:    Patient ID: [name], male    DOB: 12/17/1955, 59 y.o.   MRN: [id number]  CC: congestion, emesis  HPI: [name] is a 59 y.o. male presenting on 01/18/2015 for Nasal Congestion and Emesis  Starting 2 days ago feeling feverish, sick, unwell, with congestion, cough. Feels like he was getting sick 2 days ago Coughing a lot, throwing up everything he has eaten today, 2-3 times today. Ate oatmeal for breakfast. Constantly throwing up yesterday.  Urinating normally today. Diarrhea started today.  Lots of congestion.  Not taking anything other than what he is prescribed, has not tried OTC medicines. Has been going to work until today.   Relevant past medical, surgical, family and social history reviewed and updated as indicated. Interim medical history since our last visit reviewed. Allergies and medications reviewed and updated.  ROS: Per HPI unless specifically indicated above  Past Medical History Patient Active Problem List   Diagnosis Date Noted  . HYPERCHOLESTEROLEMIA 10/08/2009  . CEREBRAL PALSY 10/08/2009  . HYPERTENSION 10/08/2009  . CAD 10/08/2009    Current Outpatient Prescriptions  Medication Sig Dispense Refill  . ALPRAZolam (XANAX) 0.5 MG tablet Take 1 tablet (0.5 mg total) by mouth 2 (two) times daily as needed for anxiety. 30 tablet 3  . aspirin EC 81 MG tablet Take 1 tablet (81 mg total) by mouth daily. 90 tablet 0  . busPIRone (BUSPAR) 10 MG tablet Take 1 tablet by mouth 2 (two) times daily.    . cetirizine (ZYRTEC) 10 MG tablet Take 1 tablet (10 mg total) by mouth daily. 30 tablet 3  . divalproex (DEPAKOTE) 250 MG DR tablet Take 1 tablet (250 mg total) by mouth 2 (two) times daily. 60 tablet 5  . fluticasone (FLONASE) 50 MCG/ACT nasal spray Place 2 sprays into both nostrils daily. 16 g 6  . omeprazole (PRILOSEC) 20 MG capsule TAKE (1) CAPSULE TWICE DAILY BEFORE A MEAL 60 capsule 1  . ondansetron (ZOFRAN-ODT) 4 MG disintegrating tablet  Take 1 tablet (4 mg total) by mouth every 8 (eight) hours as needed for nausea or vomiting. 10 tablet 0  . sertraline (ZOLOFT) 100 MG tablet Take 1 tablet by mouth daily.    [name] sulfacetamide-prednisoLONE (VASOCIDIN) 10-0.23 % ophthalmic solution Place 1 drop into both eyes every 2 (two) hours while awake. For one week 5 mL 0   No current facility-administered medications for this visit.       Objective:    BP 140/88 mmHg  Pulse 99  Temp(Src) 100.6 F (38.1 C) (Oral)  Ht 5\' 10"  (1.778 m)  Wt 220 lb (99.791 kg)  BMI 31.57 kg/m2  Wt Readings from Last 3 Encounters:  01/18/15 220 lb (99.791 kg)  11/12/14 226 lb (102.513 kg)  09/10/14 228 lb 3.2 oz (103.511 kg)    Gen: NAD, alert, cooperative with exam, NCAT EYES: EOMI, no scleral injection or icterus ENT:  R Tm normal, L TM mildly erythematous, OP with erythema LYMPH: no cervical LAD CV: NRRR, normal S1/S2, no murmur Resp: CTABL, no wheezes, normal WOB Abd: +BS, soft, a couple of small apprx 1cm yellow bruises on R mid abdomen, otherwise NT, mildly distended, no guarding or organomegaly Ext: No edema, warm Neuro: Alert and oriented     Assessment & Plan:   [name] was seen today for flu-like illness, POCT flu was negative. Rec symptomatic care, ibuprofen, lots of fluids, gave some zofran. If still vomiting Monday needs to be seen.  Diagnoses and all orders for this visit:  Flu-like symptoms -     ondansetron (ZOFRAN-ODT) 4 MG disintegrating tablet; Take 1 tablet (4 mg total) by mouth every 8 (eight) hours as needed for nausea or vomiting.  Follow up plan: Return if symptoms worsen or fail to improve.  Assunta Found, MD [facility] 01/18/2015, 5:41 PM

## 2015-01-18 NOTE — Patient Instructions (Addendum)
Take ibuprofen 600mg  (three tabs of over the counter ibuprofen 200mg )  Take the ondansetron every 8 hours as needed for nausea  Come back to clinic on Monday or Tuesday if still throwing up.   You may still feel sick and unwell longer than that, but I want to see you back if you are still throwing up.

## 2015-02-12 ENCOUNTER — Ambulatory Visit (INDEPENDENT_AMBULATORY_CARE_PROVIDER_SITE_OTHER): Payer: Medicare Other | Admitting: Family

## 2015-02-12 ENCOUNTER — Encounter: Payer: Self-pay | Admitting: Family

## 2015-02-12 VITALS — BP 143/82 | HR 97 | Temp 97.9°F | Ht 70.0 in | Wt 228.0 lb

## 2015-02-12 DIAGNOSIS — J301 Allergic rhinitis due to pollen: Secondary | ICD-10-CM | POA: Diagnosis not present

## 2015-02-12 DIAGNOSIS — J0110 Acute frontal sinusitis, unspecified: Secondary | ICD-10-CM

## 2015-02-12 MED ORDER — AMOXICILLIN-POT CLAVULANATE 875-125 MG PO TABS: 1.0000 | ORAL_TABLET | Freq: Two times a day (BID) | ORAL | Status: DC

## 2015-02-12 MED ORDER — FLUTICASONE PROPIONATE 50 MCG/ACT NA SUSP: 2.0000 | Freq: Every day | NASAL | Status: DC

## 2015-02-12 NOTE — Progress Notes (Signed)
Subjective:    Patient ID: [name], male    DOB: [birth year], 59 y.o.   MRN: [id number]  Cough Associated symptoms include chills, ear pain, headaches and a sore throat. Pertinent negatives include no shortness of breath.  Sinus Problem This is a new problem. The current episode started in the past 7 days. The problem has been gradually worsening since onset. There has been no fever. His pain is at a severity of 10/10. The pain is moderate. Associated symptoms include chills, congestion, coughing, ear pain, headaches, a hoarse voice, sinus pressure, sneezing and a sore throat. Pertinent negatives include no shortness of breath or swollen glands. Past treatments include nothing. The treatment provided no relief.      Review of Systems  Constitutional: Positive for chills.  HENT: Positive for congestion, ear pain, hoarse voice, sinus pressure, sneezing and sore throat.   Respiratory: Positive for cough. Negative for shortness of breath.   Cardiovascular: Negative.   Gastrointestinal: Negative.   Endocrine: Negative.   Genitourinary: Negative.   Musculoskeletal: Negative.   Neurological: Positive for headaches.  Hematological: Negative.   Psychiatric/Behavioral: Negative.   All other systems reviewed and are negative.      Objective:   Physical Exam  Constitutional: He is oriented to person, place, and time. He appears well-developed and well-nourished. No distress.  HENT:  Head: Normocephalic.  Right Ear: External ear normal.  Left Ear: External ear normal.  Nose: Right sinus exhibits frontal sinus tenderness. Left sinus exhibits frontal sinus tenderness.  Nasal passage erythemas with mild swelling  Oropharynx erythemas   Eyes: Pupils are equal, round, and reactive to light. Right eye exhibits no discharge. Left eye exhibits no discharge.  Neck: Normal range of motion. Neck supple. No thyromegaly present.  Cardiovascular: Normal rate, regular rhythm, normal heart  sounds and intact distal pulses.   No murmur heard. Pulmonary/Chest: Effort normal and breath sounds normal. No respiratory distress. He has no wheezes.  Abdominal: Soft. Bowel sounds are normal. He exhibits no distension. There is no tenderness.  Musculoskeletal: Normal range of motion. He exhibits no edema or tenderness.  Neurological: He is alert and oriented to person, place, and time. He has normal reflexes. No cranial nerve deficit.  Skin: Skin is warm and dry. No rash noted. No erythema.  Psychiatric: He has a normal mood and affect. His behavior is normal. Judgment and thought content normal.  Vitals reviewed.     BP 143/82 mmHg  Pulse 97  Temp(Src) 97.9 F (36.6 C) (Oral)  Ht 5\' 10"  (1.778 m)  Wt 228 lb (103.42 kg)  BMI 32.71 kg/m2     Assessment & Plan:  1. Acute frontal sinusitis, recurrence not specified -- Take meds as prescribed - Use a cool mist humidifier  -Use saline nose sprays frequently -Saline irrigations of the nose can be very helpful if done frequently.  * 4X daily for 1 week*  * Use of a nettie pot can be helpful with this. Follow directions with this* -Force fluids -For any cough or congestion  Use plain Mucinex- regular strength or max strength is fine   * Children- consult with Pharmacist for dosing -For fever or aces or pains- take tylenol or ibuprofen appropriate for age and weight.  * for fevers greater than 101 orally you may alternate ibuprofen and tylenol every  3 hours. -Throat lozenges if help - amoxicillin-clavulanate (AUGMENTIN) 875-125 MG tablet; Take 1 tablet by mouth 2 (two) times daily.  Dispense: 14 tablet; Refill:  0  2. Seasonal allergic rhinitis due to pollen - fluticasone (FLONASE) 50 MCG/ACT nasal spray; Place 2 sprays into both nostrils daily.  Dispense: 16 g; Refill: 6    RTO 2 weeks for chronic follow up and lab work- Pt will get flu vaccine then   Evelina Dun, FNP

## 2015-02-12 NOTE — Patient Instructions (Signed)
Sinusitis, Adult Sinusitis is redness, soreness, and inflammation of the paranasal sinuses. Paranasal sinuses are air pockets within the bones of your face. They are located beneath your eyes, in the middle of your forehead, and above your eyes. In healthy paranasal sinuses, mucus is able to drain out, and air is able to circulate through them by way of your nose. However, when your paranasal sinuses are inflamed, mucus and air can become trapped. This can allow bacteria and other germs to grow and cause infection. Sinusitis can develop quickly and last only a short time (acute) or continue over a long period (chronic). Sinusitis that lasts for more than 12 weeks is considered chronic. CAUSES Causes of sinusitis include:  Allergies.  Structural abnormalities, such as displacement of the cartilage that separates your nostrils (deviated septum), which can decrease the air flow through your nose and sinuses and affect sinus drainage.  Functional abnormalities, such as when the small hairs (cilia) that line your sinuses and help remove mucus do not work properly or are not present. SIGNS AND SYMPTOMS Symptoms of acute and chronic sinusitis are the same. The primary symptoms are pain and pressure around the affected sinuses. Other symptoms include:  Upper toothache.  Earache.  Headache.  Bad breath.  Decreased sense of smell and taste.  A cough, which worsens when you are lying flat.  Fatigue.  Fever.  Thick drainage from your nose, which often is green and may contain pus (purulent).  Swelling and warmth over the affected sinuses. DIAGNOSIS Your health care provider will perform a physical exam. During your exam, your health care provider may perform any of the following to help determine if you have acute sinusitis or chronic sinusitis:  Look in your nose for signs of abnormal growths in your nostrils (nasal polyps).  Tap over the affected sinus to check for signs of  infection.  View the inside of your sinuses using an imaging device that has a light attached (endoscope). If your health care provider suspects that you have chronic sinusitis, one or more of the following tests may be recommended:  Allergy tests.  Nasal culture. A sample of mucus is taken from your nose, sent to a lab, and screened for bacteria.  Nasal cytology. A sample of mucus is taken from your nose and examined by your health care provider to determine if your sinusitis is related to an allergy. TREATMENT Most cases of acute sinusitis are related to a viral infection and will resolve on their own within 10 days. Sometimes, medicines are prescribed to help relieve symptoms of both acute and chronic sinusitis. These may include pain medicines, decongestants, nasal steroid sprays, or saline sprays. However, for sinusitis related to a bacterial infection, your health care provider will prescribe antibiotic medicines. These are medicines that will help kill the bacteria causing the infection. Rarely, sinusitis is caused by a fungal infection. In these cases, your health care provider will prescribe antifungal medicine. For some cases of chronic sinusitis, surgery is needed. Generally, these are cases in which sinusitis recurs more than 3 times per year, despite other treatments. HOME CARE INSTRUCTIONS  Drink plenty of water. Water helps thin the mucus so your sinuses can drain more easily.  Use a humidifier.  Inhale steam 3-4 times a day (for example, sit in the bathroom with the shower running).  Apply a warm, moist washcloth to your face 3-4 times a day, or as directed by your health care provider.  Use saline nasal sprays to help   moisten and clean your sinuses.  Take medicines only as directed by your health care provider.  If you were prescribed either an antibiotic or antifungal medicine, finish it all even if you start to feel better. SEEK IMMEDIATE MEDICAL CARE IF:  You have  increasing pain or severe headaches.  You have nausea, vomiting, or drowsiness.  You have swelling around your face.  You have vision problems.  You have a stiff neck.  You have difficulty breathing.   This information is not intended to replace advice given to you by your health care provider. Make sure you discuss any questions you have with your health care provider.   Document Released: 04/06/2005 Document Revised: 04/27/2014 Document Reviewed: 04/21/2011 Elsevier Interactive Patient Education 2016 [employer]  - Take meds as prescribed - Use a cool mist humidifier  -Use saline nose sprays frequently -Saline irrigations of the nose can be very helpful if done frequently.  * 4X daily for 1 week*  * Use of a nettie pot can be helpful with this. Follow directions with this* -Force fluids -For any cough or congestion  Use plain Mucinex- regular strength or max strength is fine   * Children- consult with Pharmacist for dosing -For fever or aces or pains- take tylenol or ibuprofen appropriate for age and weight.  * for fevers greater than 101 orally you may alternate ibuprofen and tylenol every  3 hours. -Throat lozenges if help   Jiana Lemaire, FNP   

## 2015-02-22 ENCOUNTER — Ambulatory Visit (INDEPENDENT_AMBULATORY_CARE_PROVIDER_SITE_OTHER): Payer: Medicare Other | Admitting: Family Medicine

## 2015-02-22 ENCOUNTER — Encounter: Payer: Self-pay | Admitting: Family Medicine

## 2015-02-22 VITALS — BP 127/93 | HR 100 | Temp 97.4°F | Ht 70.0 in | Wt 220.6 lb

## 2015-02-22 DIAGNOSIS — R1013 Epigastric pain: Secondary | ICD-10-CM | POA: Diagnosis not present

## 2015-02-22 MED ORDER — OMEPRAZOLE 20 MG PO CPDR: 20.0000 mg | DELAYED_RELEASE_CAPSULE | Freq: Every day | ORAL | Status: DC

## 2015-02-22 NOTE — Progress Notes (Signed)
   HPI  Patient presents today here with abdominal pain.  Patient explains that he's had abdominal pain in emesis for about 3 weeks. He describes his emesis as containing food, a few small drops of blood, and a few black specks. He states that he throwing up approximately once or twice a day. He is tolerating some foods and fluids but they mostly make him nauseous. Zofran has not helped. He was treated for an sinus infection with Augmentin which has not improved his abdominal pain.  He states that he recently quit going to a psychiatrist, I have encouraged him to continue going there.  He denies fever, chills, problems breathing. He denies alcohol use but later states that he has had a little bit of wine lately.   PMH: Smoking status noted ROS: Per HPI  Objective: BP 127/93 mmHg  Pulse 100  Temp(Src) 97.4 F (36.3 C) (Oral)  Ht $R'5\' 10"'Ys$  (1.778 m)  Wt 220 lb 9.6 oz (100.064 kg)  BMI 31.65 kg/m2 Gen: NAD, alert, cooperative with exam HEENT: NCAT, EOMI, PERRL CV: RRR, good S1/S2, no murmur Resp: CTABL, no wheezes, non-labored Abd: Soft, tenderness to palpation in the epigastric area in the left upper quadrant as well as the left lower quadrant. No guarding, positive bowel sounds Ext: No edema, warm Neuro: Alert and oriented, No gross deficits  Assessment and plan:  # Epigastric abdominal pain Consider pancreatitis, abdominal exam is reassuring Given very strict parameters for the patient to seek emergency medical care if he is not able to tolerate food or fluids by mouth, if he develops worsening persistent emesis, if he sees coffee-ground like substance or increased black specks in his vomit, or if he sees increased blood in his vomit. Labs, abdominal ultrasound Low threshold for seeking emergency medical care    Orders Placed This Encounter  Procedures  . US Abdomen Complete    Standing Status: Future     Number of Occurrences:      Standing Expiration Date: 04/23/2016      Order Specific Question:  Reason for Exam (SYMPTOM  OR DIAGNOSIS REQUIRED)    Answer:  epigastric abd pain    Order Specific Question:  Preferred imaging location?    Answer:  [facility]  . Lipase  . CMP14+EGFR  . CBC    Meds ordered this encounter  Medications  . omeprazole (PRILOSEC) 20 MG capsule    Sig: Take 1 capsule (20 mg total) by mouth daily.    Dispense:  60 capsule    Refill:  [place], MD [facility] 02/22/2015, 3:18 PM

## 2015-02-22 NOTE — Patient Instructions (Addendum)
If you are not able to tolerate food or fluids at all please go to the hopsital.  We are doing some studies to see if we can explain your pain.   Try prilosec for now.  Abdominal Pain, Adult Many things can cause abdominal pain. Usually, abdominal pain is not caused by a disease and will improve without treatment. It can often be observed and treated at home. Your health care provider will do a physical exam and possibly order blood tests and X-rays to help determine the seriousness of your pain. However, in many cases, more time must pass before a clear cause of the pain can be found. Before that point, your health care provider may not know if you need more testing or further treatment. HOME CARE INSTRUCTIONS Monitor your abdominal pain for any changes. The following actions may help to alleviate any discomfort you are experiencing:  Only take over-the-counter or prescription medicines as directed by your health care provider.  Do not take laxatives unless directed to do so by your health care provider.  Try a clear liquid diet (broth, tea, or water) as directed by your health care provider. Slowly move to a bland diet as tolerated. SEEK MEDICAL CARE IF:  You have unexplained abdominal pain.  You have abdominal pain associated with nausea or diarrhea.  You have pain when you urinate or have a bowel movement.  You experience abdominal pain that wakes you in the night.  You have abdominal pain that is worsened or improved by eating food.  You have abdominal pain that is worsened with eating fatty foods.  You have a fever. SEEK IMMEDIATE MEDICAL CARE IF:  Your pain does not go away within 2 hours.  You keep throwing up (vomiting).  Your pain is felt only in portions of the abdomen, such as the right side or the left lower portion of the abdomen.  You pass bloody or black tarry stools. MAKE SURE YOU:  Understand these instructions.  Will watch your condition.  Will get help  right away if you are not doing well or get worse.   This information is not intended to replace advice given to you by your health care provider. Make sure you discuss any questions you have with your health care provider.   Document Released: 01/14/2005 Document Revised: 12/26/2014 Document Reviewed: 12/14/2012 Elsevier Interactive Patient Education [employer].

## 2015-02-23 LAB — CMP14+EGFR
ALBUMIN: 4.1 g/dL (ref 3.5–5.5)
ALK PHOS: 75 IU/L (ref 39–117)
ALT: 17 IU/L (ref 0–44)
AST: 19 IU/L (ref 0–40)
Albumin/Globulin Ratio: 1.3 (ref 1.1–2.5)
BILIRUBIN TOTAL: 0.5 mg/dL (ref 0.0–1.2)
BUN / CREAT RATIO: 10 (ref 9–20)
BUN: 7 mg/dL (ref 6–24)
CHLORIDE: 96 mmol/L — AB (ref 97–106)
CO2: 29 mmol/L (ref 18–29)
CREATININE: 0.67 mg/dL — AB (ref 0.76–1.27)
Calcium: 9.4 mg/dL (ref 8.7–10.2)
GFR calc non Af Amer: 107 mL/min/{1.73_m2} (ref >59)
GFR, EST AFRICAN AMERICAN: 123 mL/min/{1.73_m2} (ref >59)
GLOBULIN, TOTAL: 3.1 g/dL (ref 1.5–4.5)
Glucose: 89 mg/dL (ref 65–99)
Potassium: 4.3 mmol/L (ref 3.5–5.2)
SODIUM: 141 mmol/L (ref 136–144)
Total Protein: 7.2 g/dL (ref 6.0–8.5)

## 2015-02-23 LAB — CBC
HEMATOCRIT: 46.6 % (ref 37.5–51.0)
Hemoglobin: 16.7 g/dL (ref 12.6–17.7)
MCH: 37 pg — ABNORMAL HIGH (ref 26.6–33.0)
MCHC: 35.8 g/dL — AB (ref 31.5–35.7)
MCV: 103 fL — AB (ref 79–97)
PLATELETS: 226 10*3/uL (ref 150–379)
RBC: 4.51 x10E6/uL (ref 4.14–5.80)
RDW: 13.4 % (ref 12.3–15.4)
WBC: 10.2 10*3/uL (ref 3.4–10.8)

## 2015-02-23 LAB — LIPASE: Lipase: 25 U/L (ref 0–59)

## 2015-02-26 ENCOUNTER — Encounter (INDEPENDENT_AMBULATORY_CARE_PROVIDER_SITE_OTHER): Payer: Self-pay | Admitting: *Deleted

## 2015-02-26 ENCOUNTER — Encounter: Payer: Self-pay | Admitting: Family

## 2015-02-26 ENCOUNTER — Ambulatory Visit (INDEPENDENT_AMBULATORY_CARE_PROVIDER_SITE_OTHER): Payer: Medicare Other | Admitting: Family

## 2015-02-26 VITALS — BP 149/93 | HR 83 | Temp 97.0°F | Ht 70.0 in | Wt 225.4 lb

## 2015-02-26 DIAGNOSIS — Z1211 Encounter for screening for malignant neoplasm of colon: Secondary | ICD-10-CM

## 2015-02-26 DIAGNOSIS — I252 Old myocardial infarction: Secondary | ICD-10-CM

## 2015-02-26 DIAGNOSIS — I25119 Atherosclerotic heart disease of native coronary artery with unspecified angina pectoris: Secondary | ICD-10-CM | POA: Diagnosis not present

## 2015-02-26 DIAGNOSIS — E7800 Pure hypercholesterolemia, unspecified: Secondary | ICD-10-CM | POA: Diagnosis not present

## 2015-02-26 DIAGNOSIS — I10 Essential (primary) hypertension: Secondary | ICD-10-CM

## 2015-02-26 DIAGNOSIS — Z23 Encounter for immunization: Secondary | ICD-10-CM

## 2015-02-26 DIAGNOSIS — Z1159 Encounter for screening for other viral diseases: Secondary | ICD-10-CM

## 2015-02-26 MED ORDER — LISINOPRIL 20 MG PO TABS: 20.0000 mg | ORAL_TABLET | Freq: Every day | ORAL | Status: DC

## 2015-02-26 NOTE — Progress Notes (Signed)
Subjective:    Patient ID: [name], male    DOB: [birth year], 59 y.o.   MRN: [id number]  Pt presents to the office today to recheck HTN. Pt's BP is at goal today!! Hypertension This is a chronic problem. The current episode started more than 1 year ago. The problem has been resolved since onset. The problem is controlled. Pertinent negatives include no anxiety, chest pain, palpitations, peripheral edema or shortness of breath. Risk factors for coronary artery disease include obesity, male gender, smoking/tobacco exposure and sedentary lifestyle. Past treatments include nothing. The current treatment provides moderate improvement. Hypertensive end-organ damage includes CAD/MI. There is no history of kidney disease, CVA or heart failure.  Gastroesophageal Reflux He reports no chest pain, no coughing, no heartburn, no nausea or no wheezing. This is a chronic problem. The current episode started more than 1 year ago. The problem occurs rarely. The problem has been resolved. The symptoms are aggravated by certain foods. Risk factors include obesity. He has tried a PPI for the symptoms. The treatment provided significant relief.      Review of Systems  Constitutional: Negative.   HENT: Negative.   Respiratory: Negative.  Negative for cough, shortness of breath and wheezing.   Cardiovascular: Negative.  Negative for chest pain and palpitations.  Gastrointestinal: Negative.  Negative for heartburn and nausea.  Endocrine: Negative.   Genitourinary: Negative.   Musculoskeletal: Negative.   Neurological: Negative.   Hematological: Negative.   Psychiatric/Behavioral: Negative.   All other systems reviewed and are negative.      Objective:   Physical Exam  Constitutional: He is oriented to person, place, and time. He appears well-developed and well-nourished. No distress.  HENT:  Head: Normocephalic.  Right Ear: External ear normal.  Left Ear: External ear normal.  Nose: Nose normal.    Mouth/Throat: Oropharynx is clear and moist.  Eyes: Pupils are equal, round, and reactive to light. Right eye exhibits no discharge. Left eye exhibits no discharge.  Neck: Normal range of motion. Neck supple. No thyromegaly present.  Cardiovascular: Normal rate, regular rhythm, normal heart sounds and intact distal pulses.   No murmur heard. Pulmonary/Chest: Effort normal and breath sounds normal. No respiratory distress. He has no wheezes.  Abdominal: Soft. Bowel sounds are normal. He exhibits no distension. There is no tenderness.  Musculoskeletal: Normal range of motion. He exhibits no edema or tenderness.  Neurological: He is alert and oriented to person, place, and time. He has normal reflexes. No cranial nerve deficit.  Skin: Skin is warm and dry. No rash noted. No erythema.  Psychiatric: He has a normal mood and affect. His behavior is normal. Judgment and thought content normal.  Vitals reviewed.    BP 138/90 mmHg  Pulse 84  Temp(Src) 97 F (36.1 C) (Oral)  Ht '5\' 10"'  (1.778 m)  Wt 225 lb 6.4 oz (102.241 kg)  BMI 32.34 kg/m2      Assessment & Plan:  1. Encounter for screening colonoscopy - Ambulatory referral to Gastroenterology - CMP14+EGFR  2. Atherosclerosis of native coronary artery of native heart with angina pectoris (HCC) - CMP14+EGFR - lisinopril (PRINIVIL,ZESTRIL) 20 MG tablet; Take 1 tablet (20 mg total) by mouth daily.  Dispense: 90 tablet; Refill: 3  3. Essential hypertension -Pt started on lisinopril today -Daily blood pressure log given with instructions on how to fill out and told to bring to next visit -Dash diet information given -Exercise encouraged - Stress Management  -Continue current meds -RTO in 2 weeks -  CMP14+EGFR - lisinopril (PRINIVIL,ZESTRIL) 20 MG tablet; Take 1 tablet (20 mg total) by mouth daily.  Dispense: 90 tablet; Refill: 3  4. HYPERCHOLESTEROLEMIA - CMP14+EGFR - Lipid panel  5. History of MI (myocardial infarction) -  CMP14+EGFR  6. Need for hepatitis C screening test - CMP14+EGFR - Hepatitis C antibody   Continue all meds Labs pending Health Maintenance reviewed Diet and exercise encouraged RTO 2 weeks   Evelina Dun, FNP

## 2015-02-26 NOTE — Patient Instructions (Signed)
Hypertension Hypertension, commonly called high blood pressure, is when the force of blood pumping through your arteries is too strong. Your arteries are the blood vessels that carry blood from your heart throughout your body. A blood pressure reading consists of a higher number over a lower number, such as 110/72. The higher number (systolic) is the pressure inside your arteries when your heart pumps. The lower number (diastolic) is the pressure inside your arteries when your heart relaxes. Ideally you want your blood pressure below 120/80. Hypertension forces your heart to work harder to pump blood. Your arteries may become narrow or stiff. Having untreated or uncontrolled hypertension can cause heart attack, stroke, kidney disease, and other problems. RISK FACTORS Some risk factors for high blood pressure are controllable. Others are not.  Risk factors you cannot control include:   Race. You may be at higher risk if you are African American.  Age. Risk increases with age.  Gender. Men are at higher risk than women before age 45 years. After age 65, women are at higher risk than men. Risk factors you can control include:  Not getting enough exercise or physical activity.  Being overweight.  Getting too much fat, sugar, calories, or salt in your diet.  Drinking too much alcohol. SIGNS AND SYMPTOMS Hypertension does not usually cause signs or symptoms. Extremely high blood pressure (hypertensive crisis) may cause headache, anxiety, shortness of breath, and nosebleed. DIAGNOSIS To check if you have hypertension, your health care provider will measure your blood pressure while you are seated, with your arm held at the level of your heart. It should be measured at least twice using the same arm. Certain conditions can cause a difference in blood pressure between your right and left arms. A blood pressure reading that is higher than normal on one occasion does not mean that you need treatment. If  it is not clear whether you have high blood pressure, you may be asked to return on a different day to have your blood pressure checked again. Or, you may be asked to monitor your blood pressure at home for 1 or more weeks. TREATMENT Treating high blood pressure includes making lifestyle changes and possibly taking medicine. Living a healthy lifestyle can help lower high blood pressure. You may need to change some of your habits. Lifestyle changes may include:  Following the DASH diet. This diet is high in fruits, vegetables, and whole grains. It is low in salt, red meat, and added sugars.  Keep your sodium intake below 2,300 mg per day.  Getting at least 30-45 minutes of aerobic exercise at least 4 times per week.  Losing weight if necessary.  Not smoking.  Limiting alcoholic beverages.  Learning ways to reduce stress. Your health care provider may prescribe medicine if lifestyle changes are not enough to get your blood pressure under control, and if one of the following is true:  You are 18-59 years of age and your systolic blood pressure is above 140.  You are 60 years of age or older, and your systolic blood pressure is above 150.  Your diastolic blood pressure is above 90.  You have diabetes, and your systolic blood pressure is over 140 or your diastolic blood pressure is over 90.  You have kidney disease and your blood pressure is above 140/90.  You have heart disease and your blood pressure is above 140/90. Your personal target blood pressure may vary depending on your medical conditions, your age, and other factors. HOME CARE INSTRUCTIONS    Have your blood pressure rechecked as directed by your health care provider.   Take medicines only as directed by your health care provider. Follow the directions carefully. Blood pressure medicines must be taken as prescribed. The medicine does not work as well when you skip doses. Skipping doses also puts you at risk for  problems.  Do not smoke.   Monitor your blood pressure at home as directed by your health care provider. SEEK MEDICAL CARE IF:   You think you are having a reaction to medicines taken.  You have recurrent headaches or feel dizzy.  You have swelling in your ankles.  You have trouble with your vision. SEEK IMMEDIATE MEDICAL CARE IF:  You develop a severe headache or confusion.  You have unusual weakness, numbness, or feel faint.  You have severe chest or abdominal pain.  You vomit repeatedly.  You have trouble breathing. MAKE SURE YOU:   Understand these instructions.  Will watch your condition.  Will get help right away if you are not doing well or get worse.   This information is not intended to replace advice given to you by your health care provider. Make sure you discuss any questions you have with your health care provider.   Document Released: 04/06/2005 Document Revised: 08/21/2014 Document Reviewed: 01/27/2013 Elsevier Interactive Patient Education 2016 Elsevier Inc.  

## 2015-02-27 LAB — LIPID PANEL
CHOL/HDL RATIO: 4.8 ratio (ref 0.0–5.0)
Cholesterol, Total: 160 mg/dL (ref 100–199)
HDL: 33 mg/dL — AB (ref >39)
LDL Calculated: 97 mg/dL (ref 0–99)
Triglycerides: 148 mg/dL (ref 0–149)
VLDL CHOLESTEROL CAL: 30 mg/dL (ref 5–40)

## 2015-02-27 LAB — CMP14+EGFR
A/G RATIO: 1.5 (ref 1.1–2.5)
ALT: 14 IU/L (ref 0–44)
AST: 20 IU/L (ref 0–40)
Albumin: 4.2 g/dL (ref 3.5–5.5)
Alkaline Phosphatase: 76 IU/L (ref 39–117)
BILIRUBIN TOTAL: 0.7 mg/dL (ref 0.0–1.2)
BUN / CREAT RATIO: 12 (ref 9–20)
BUN: 7 mg/dL (ref 6–24)
CHLORIDE: 97 mmol/L (ref 97–106)
CO2: 27 mmol/L (ref 18–29)
Calcium: 9.2 mg/dL (ref 8.7–10.2)
Creatinine, Ser: 0.6 mg/dL — ABNORMAL LOW (ref 0.76–1.27)
GFR, EST AFRICAN AMERICAN: 129 mL/min/{1.73_m2} (ref >59)
GFR, EST NON AFRICAN AMERICAN: 112 mL/min/{1.73_m2} (ref >59)
Globulin, Total: 2.8 g/dL (ref 1.5–4.5)
Glucose: 85 mg/dL (ref 65–99)
POTASSIUM: 4.4 mmol/L (ref 3.5–5.2)
Sodium: 141 mmol/L (ref 136–144)
Total Protein: 7 g/dL (ref 6.0–8.5)

## 2015-02-27 LAB — HEPATITIS C ANTIBODY

## 2015-03-08 ENCOUNTER — Telehealth: Payer: Self-pay | Admitting: Family Medicine

## 2015-03-09 DIAGNOSIS — Z7982 Long term (current) use of aspirin: Secondary | ICD-10-CM | POA: Diagnosis not present

## 2015-03-09 DIAGNOSIS — J449 Chronic obstructive pulmonary disease, unspecified: Secondary | ICD-10-CM | POA: Diagnosis not present

## 2015-03-09 DIAGNOSIS — F319 Bipolar disorder, unspecified: Secondary | ICD-10-CM | POA: Diagnosis not present

## 2015-03-09 DIAGNOSIS — R05 Cough: Secondary | ICD-10-CM | POA: Diagnosis not present

## 2015-03-09 DIAGNOSIS — Z79899 Other long term (current) drug therapy: Secondary | ICD-10-CM | POA: Diagnosis not present

## 2015-03-09 DIAGNOSIS — F419 Anxiety disorder, unspecified: Secondary | ICD-10-CM | POA: Diagnosis not present

## 2015-03-09 DIAGNOSIS — R079 Chest pain, unspecified: Secondary | ICD-10-CM | POA: Diagnosis not present

## 2015-03-09 DIAGNOSIS — R0602 Shortness of breath: Secondary | ICD-10-CM | POA: Diagnosis not present

## 2015-03-09 DIAGNOSIS — K219 Gastro-esophageal reflux disease without esophagitis: Secondary | ICD-10-CM | POA: Diagnosis not present

## 2015-03-09 DIAGNOSIS — I2510 Atherosclerotic heart disease of native coronary artery without angina pectoris: Secondary | ICD-10-CM | POA: Diagnosis not present

## 2015-03-09 DIAGNOSIS — J45909 Unspecified asthma, uncomplicated: Secondary | ICD-10-CM | POA: Diagnosis not present

## 2015-03-09 DIAGNOSIS — F17200 Nicotine dependence, unspecified, uncomplicated: Secondary | ICD-10-CM | POA: Diagnosis not present

## 2015-03-09 DIAGNOSIS — M940 Chondrocostal junction syndrome [Tietze]: Secondary | ICD-10-CM | POA: Diagnosis not present

## 2015-03-09 DIAGNOSIS — Z955 Presence of coronary angioplasty implant and graft: Secondary | ICD-10-CM | POA: Diagnosis not present

## 2015-03-09 DIAGNOSIS — R0789 Other chest pain: Secondary | ICD-10-CM | POA: Diagnosis not present

## 2015-03-10 DIAGNOSIS — I2510 Atherosclerotic heart disease of native coronary artery without angina pectoris: Secondary | ICD-10-CM | POA: Diagnosis not present

## 2015-03-10 DIAGNOSIS — J449 Chronic obstructive pulmonary disease, unspecified: Secondary | ICD-10-CM | POA: Diagnosis not present

## 2015-03-10 DIAGNOSIS — R0789 Other chest pain: Secondary | ICD-10-CM | POA: Diagnosis not present

## 2015-03-10 DIAGNOSIS — M940 Chondrocostal junction syndrome [Tietze]: Secondary | ICD-10-CM | POA: Diagnosis not present

## 2015-03-11 ENCOUNTER — Ambulatory Visit (HOSPITAL_COMMUNITY): Admission: RE | Admit: 2015-03-11 | Payer: Medicare Other | Source: Ambulatory Visit

## 2015-03-11 NOTE — Telephone Encounter (Signed)
Stp and he has appt scheduled with Alyse Low tomorrow at 10:25.

## 2015-03-12 ENCOUNTER — Encounter: Payer: Self-pay | Admitting: Family

## 2015-03-12 ENCOUNTER — Ambulatory Visit (INDEPENDENT_AMBULATORY_CARE_PROVIDER_SITE_OTHER): Payer: Medicare Other | Admitting: Family

## 2015-03-12 VITALS — BP 118/74 | HR 94 | Temp 97.3°F | Ht 70.0 in | Wt 226.6 lb

## 2015-03-12 DIAGNOSIS — I10 Essential (primary) hypertension: Secondary | ICD-10-CM

## 2015-03-12 DIAGNOSIS — K625 Hemorrhage of anus and rectum: Secondary | ICD-10-CM

## 2015-03-12 DIAGNOSIS — Z1211 Encounter for screening for malignant neoplasm of colon: Secondary | ICD-10-CM

## 2015-03-12 DIAGNOSIS — I25119 Atherosclerotic heart disease of native coronary artery with unspecified angina pectoris: Secondary | ICD-10-CM

## 2015-03-12 DIAGNOSIS — E7800 Pure hypercholesterolemia, unspecified: Secondary | ICD-10-CM

## 2015-03-12 MED ORDER — SIMVASTATIN 20 MG PO TABS: 20.0000 mg | ORAL_TABLET | Freq: Every day | ORAL | Status: DC

## 2015-03-12 NOTE — Patient Instructions (Signed)

## 2015-03-12 NOTE — Progress Notes (Signed)
   Subjective:    Patient ID: [name], male    DOB: [birth year], 57 y.o.   MRN: [id number]  Pt presents to the office today to recheck HTN. PT's BP is at goal!! Pt states he went to the ED on 03/10/15 and was diagnosed with bronchitis and was given albuterol, levofloxacin, and  Prednisone. Pt states he is taking these meds as prescribed and is feeling better. Pt states he was also having rectal bleeding and was told to follow up with Doristine Mango MD about this issue. Pt to call and make appt. Hypertension This is a chronic problem. The current episode started more than 1 year ago. The problem has been resolved since onset. The problem is controlled. Pertinent negatives include no anxiety, headaches, palpitations or peripheral edema. Past treatments include ACE inhibitors. The current treatment provides moderate improvement. Hypertensive end-organ damage includes CAD/MI. There is no history of kidney disease, CVA, heart failure or a thyroid problem. There is no history of sleep apnea.      Review of Systems  Constitutional: Negative.   HENT: Negative.   Respiratory: Negative.   Cardiovascular: Negative.  Negative for palpitations.  Gastrointestinal: Negative.   Endocrine: Negative.   Genitourinary: Negative.   Musculoskeletal: Negative.   Neurological: Negative.  Negative for headaches.  Hematological: Negative.   Psychiatric/Behavioral: Negative.   All other systems reviewed and are negative.      Objective:   Physical Exam  Constitutional: He is oriented to person, place, and time. He appears well-developed and well-nourished. No distress.  HENT:  Head: Normocephalic.  Right Ear: External ear normal.  Left Ear: External ear normal.  Nose: Nose normal.  Mouth/Throat: Oropharynx is clear and moist.  Eyes: Pupils are equal, round, and reactive to light. Right eye exhibits no discharge. Left eye exhibits no discharge.  Neck: Normal range of motion. Neck supple. No  thyromegaly present.  Cardiovascular: Normal rate, regular rhythm, normal heart sounds and intact distal pulses.   No murmur heard. Pulmonary/Chest: Effort normal and breath sounds normal. No respiratory distress. He has no wheezes.  Abdominal: Soft. Bowel sounds are normal. He exhibits no distension. There is no tenderness.  Musculoskeletal: Normal range of motion. He exhibits no edema or tenderness.  Neurological: He is alert and oriented to person, place, and time. He has normal reflexes. No cranial nerve deficit.  Skin: Skin is warm and dry. No rash noted. No erythema.  Psychiatric: He has a normal mood and affect. His behavior is normal. Judgment and thought content normal.  Vitals reviewed.   BP 118/74 mmHg  Pulse 94  Temp(Src) 97.3 F (36.3 C) (Oral)  Ht $R'5\' 10"'Xm$  (1.778 m)  Wt 226 lb 9.6 oz (102.785 kg)  BMI 32.51 kg/m2       Assessment & Plan:  1. Essential hypertension - BMP8+EGFR  2. Rectal bleeding - Ambulatory referral to Gastroenterology - BMP8+EGFR  3. Screening for colon cancer - Ambulatory referral to Gastroenterology - BMP8+EGFR  4. Atherosclerosis of native coronary artery of native heart with angina pectoris (HCC) - simvastatin (ZOCOR) 20 MG tablet; Take 1 tablet (20 mg total) by mouth at bedtime.  Dispense: 90 tablet; Refill: 3  5. HYPERCHOLESTEROLEMIA - simvastatin (ZOCOR) 20 MG tablet; Take 1 tablet (20 mg total) by mouth at bedtime.  Dispense: 90 tablet; Refill: 3   Continue all meds Labs pending Health Maintenance reviewed Diet and exercise encouraged RTO 3 months   Evelina Dun, FNP

## 2015-03-12 NOTE — Addendum Note (Signed)
Addended by: Earlene Plater on: 03/12/2015 10:33 AM   Modules accepted: Miquel Dunn

## 2015-03-13 ENCOUNTER — Telehealth: Payer: Self-pay | Admitting: Family

## 2015-03-13 LAB — BMP8+EGFR
BUN/Creatinine Ratio: 20 (ref 9–20)
BUN: 11 mg/dL (ref 6–24)
CALCIUM: 9.5 mg/dL (ref 8.7–10.2)
CO2: 33 mmol/L — ABNORMAL HIGH (ref 18–29)
CREATININE: 0.55 mg/dL — AB (ref 0.76–1.27)
Chloride: 96 mmol/L — ABNORMAL LOW (ref 97–106)
GFR, EST AFRICAN AMERICAN: 134 mL/min/{1.73_m2} (ref >59)
GFR, EST NON AFRICAN AMERICAN: 116 mL/min/{1.73_m2} (ref >59)
Glucose: 84 mg/dL (ref 65–99)
Potassium: 4.8 mmol/L (ref 3.5–5.2)
Sodium: 141 mmol/L (ref 136–144)

## 2015-04-08 ENCOUNTER — Telehealth: Payer: Self-pay | Admitting: Family Medicine

## 2015-04-08 NOTE — Telephone Encounter (Signed)
denied °

## 2015-04-17 DIAGNOSIS — K625 Hemorrhage of anus and rectum: Secondary | ICD-10-CM | POA: Diagnosis not present

## 2015-04-19 DIAGNOSIS — Z7982 Long term (current) use of aspirin: Secondary | ICD-10-CM | POA: Diagnosis not present

## 2015-04-19 DIAGNOSIS — Z823 Family history of stroke: Secondary | ICD-10-CM | POA: Diagnosis not present

## 2015-04-19 DIAGNOSIS — D125 Benign neoplasm of sigmoid colon: Secondary | ICD-10-CM | POA: Diagnosis not present

## 2015-04-19 DIAGNOSIS — E7800 Pure hypercholesterolemia, unspecified: Secondary | ICD-10-CM | POA: Diagnosis not present

## 2015-04-19 DIAGNOSIS — Z955 Presence of coronary angioplasty implant and graft: Secondary | ICD-10-CM | POA: Diagnosis not present

## 2015-04-19 DIAGNOSIS — I2510 Atherosclerotic heart disease of native coronary artery without angina pectoris: Secondary | ICD-10-CM | POA: Diagnosis not present

## 2015-04-19 DIAGNOSIS — G809 Cerebral palsy, unspecified: Secondary | ICD-10-CM | POA: Diagnosis not present

## 2015-04-19 DIAGNOSIS — K625 Hemorrhage of anus and rectum: Secondary | ICD-10-CM | POA: Diagnosis not present

## 2015-04-19 DIAGNOSIS — D123 Benign neoplasm of transverse colon: Secondary | ICD-10-CM | POA: Diagnosis not present

## 2015-04-19 DIAGNOSIS — Z800 Family history of malignant neoplasm of digestive organs: Secondary | ICD-10-CM | POA: Diagnosis not present

## 2015-04-19 DIAGNOSIS — Z8249 Family history of ischemic heart disease and other diseases of the circulatory system: Secondary | ICD-10-CM | POA: Diagnosis not present

## 2015-04-19 DIAGNOSIS — Z833 Family history of diabetes mellitus: Secondary | ICD-10-CM | POA: Diagnosis not present

## 2015-04-19 DIAGNOSIS — Z836 Family history of other diseases of the respiratory system: Secondary | ICD-10-CM | POA: Diagnosis not present

## 2015-04-19 DIAGNOSIS — Z79899 Other long term (current) drug therapy: Secondary | ICD-10-CM | POA: Diagnosis not present

## 2015-04-19 DIAGNOSIS — K219 Gastro-esophageal reflux disease without esophagitis: Secondary | ICD-10-CM | POA: Diagnosis not present

## 2015-04-19 DIAGNOSIS — Z8371 Family history of colonic polyps: Secondary | ICD-10-CM | POA: Diagnosis not present

## 2015-04-19 DIAGNOSIS — Z8489 Family history of other specified conditions: Secondary | ICD-10-CM | POA: Diagnosis not present

## 2015-04-19 DIAGNOSIS — I10 Essential (primary) hypertension: Secondary | ICD-10-CM | POA: Diagnosis not present

## 2015-04-19 DIAGNOSIS — F17210 Nicotine dependence, cigarettes, uncomplicated: Secondary | ICD-10-CM | POA: Diagnosis not present

## 2015-04-19 DIAGNOSIS — F319 Bipolar disorder, unspecified: Secondary | ICD-10-CM | POA: Diagnosis not present

## 2015-04-19 DIAGNOSIS — I252 Old myocardial infarction: Secondary | ICD-10-CM | POA: Diagnosis not present

## 2015-04-20 DIAGNOSIS — K219 Gastro-esophageal reflux disease without esophagitis: Secondary | ICD-10-CM | POA: Diagnosis not present

## 2015-04-20 DIAGNOSIS — F419 Anxiety disorder, unspecified: Secondary | ICD-10-CM | POA: Diagnosis not present

## 2015-04-20 DIAGNOSIS — F17200 Nicotine dependence, unspecified, uncomplicated: Secondary | ICD-10-CM | POA: Diagnosis not present

## 2015-04-20 DIAGNOSIS — K9189 Other postprocedural complications and disorders of digestive system: Secondary | ICD-10-CM | POA: Diagnosis not present

## 2015-04-20 DIAGNOSIS — I10 Essential (primary) hypertension: Secondary | ICD-10-CM | POA: Diagnosis not present

## 2015-04-20 DIAGNOSIS — Z79899 Other long term (current) drug therapy: Secondary | ICD-10-CM | POA: Diagnosis not present

## 2015-04-20 DIAGNOSIS — R1084 Generalized abdominal pain: Secondary | ICD-10-CM | POA: Diagnosis not present

## 2015-04-20 DIAGNOSIS — R1010 Upper abdominal pain, unspecified: Secondary | ICD-10-CM | POA: Diagnosis not present

## 2015-04-20 DIAGNOSIS — E7800 Pure hypercholesterolemia, unspecified: Secondary | ICD-10-CM | POA: Diagnosis not present

## 2015-04-20 DIAGNOSIS — Z7982 Long term (current) use of aspirin: Secondary | ICD-10-CM | POA: Diagnosis not present

## 2015-05-02 ENCOUNTER — Encounter: Payer: Self-pay | Admitting: Family

## 2015-05-16 ENCOUNTER — Ambulatory Visit (INDEPENDENT_AMBULATORY_CARE_PROVIDER_SITE_OTHER): Payer: Medicare Other | Admitting: Family Medicine

## 2015-05-16 ENCOUNTER — Encounter: Payer: Self-pay | Admitting: Family Medicine

## 2015-05-16 VITALS — BP 125/82 | HR 101 | Temp 98.2°F | Ht 70.0 in | Wt 237.6 lb

## 2015-05-16 DIAGNOSIS — J441 Chronic obstructive pulmonary disease with (acute) exacerbation: Secondary | ICD-10-CM

## 2015-05-16 DIAGNOSIS — I10 Essential (primary) hypertension: Secondary | ICD-10-CM

## 2015-05-16 MED ORDER — PREDNISONE 20 MG PO TABS: ORAL_TABLET | ORAL | Status: DC

## 2015-05-16 MED ORDER — FLUTICASONE PROPIONATE 50 MCG/ACT NA SUSP: 2.0000 | Freq: Every day | NASAL | Status: DC

## 2015-05-16 MED ORDER — LISINOPRIL 20 MG PO TABS: 20.0000 mg | ORAL_TABLET | Freq: Every day | ORAL | Status: DC

## 2015-05-16 MED ORDER — PROAIR HFA 108 (90 BASE) MCG/ACT IN AERS: 1.0000 | INHALATION_SPRAY | Freq: Four times a day (QID) | RESPIRATORY_TRACT | Status: DC | PRN

## 2015-05-16 NOTE — Progress Notes (Signed)
BP 125/82 mmHg  Pulse 101  Temp(Src) 98.2 F (36.8 C) (Oral)  Ht _0  (1.778 m)  Wt 237 lb 9.6 oz (107.775 kg)  BMI 34.09 kg/m2   Subjective:    Patient ID: [name], male    DOB: Sep 25, 1955, 60 y.o.   MRN: [id number]  HPI: ANDREI MCCOOK is a 60 y.o. male presenting on 05/16/2015 for Hypertension   HPI Hypertension Patient is coming in today because he thought his blood pressure would be elevated based on the weight is currently woke up this morning. He described a flushed sensation and that was also having wheezing with it. He denies any chest pain or focal numbness or weakness or vision changes.  Wheezing Patient has been having more wheezing and chest congestion and nasal symptoms over the past few days. He ran out of his inhaler that he usually uses for wheezing. He is still smoking cigars. He denies any fevers or chills or shortness of breath. He denies any chest pain. He does have a cough that is mostly dry and nonproductive.  Relevant past medical, surgical, family and social history reviewed and updated as indicated. Interim medical history since our last visit reviewed. Allergies and medications reviewed and updated.  Review of Systems  Constitutional: Negative for fever and chills.  HENT: Positive for congestion, postnasal drip, rhinorrhea, sinus pressure and sore throat. Negative for ear discharge, ear pain, sneezing and voice change.   Eyes: Negative for pain, discharge, redness and visual disturbance.  Respiratory: Positive for cough and wheezing. Negative for chest tightness and shortness of breath.   Cardiovascular: Negative for chest pain and leg swelling.  Gastrointestinal: Negative for abdominal pain, diarrhea and constipation.  Genitourinary: Negative for difficulty urinating.  Musculoskeletal: Negative for back pain and gait problem.  Skin: Negative for rash.  Neurological: Negative for dizziness, syncope, light-headedness and headaches.  All other  systems reviewed and are negative.   Per HPI unless specifically indicated above     Medication List       This list is accurate as of: 05/16/15 11:54 AM.  Always use your most recent med list.               aspirin EC 81 MG tablet  Take 1 tablet (81 mg total) by mouth daily.     fluticasone 50 MCG/ACT nasal spray  Commonly known as:  FLONASE  Place 2 sprays into both nostrils daily.     lisinopril 20 MG tablet  Commonly known as:  PRINIVIL,ZESTRIL  Take 1 tablet (20 mg total) by mouth daily.     omeprazole 20 MG capsule  Commonly known as:  PRILOSEC  Take 1 capsule (20 mg total) by mouth daily.     predniSONE 20 MG tablet  Commonly known as:  DELTASONE  2 po at same time daily for 5 days     PROAIR HFA 108 (90 Base) MCG/ACT inhaler  Generic drug:  albuterol  Inhale 1-2 puffs into the lungs every 6 (six) hours as needed for wheezing or shortness of breath.     simvastatin 20 MG tablet  Commonly known as:  ZOCOR  Take 1 tablet (20 mg total) by mouth at bedtime.           Objective:    BP 125/82 mmHg  Pulse 101  Temp(Src) 98.2 F (36.8 C) (Oral)  Ht _1  (1.778 m)  Wt 237 lb 9.6 oz (107.775 kg)  BMI 34.09 kg/m2  Wt Readings  from Last 3 Encounters:  05/16/15 237 lb 9.6 oz (107.775 kg)  03/12/15 226 lb 9.6 oz (102.785 kg)  02/26/15 225 lb 6.4 oz (102.241 kg)    Physical Exam  Constitutional: He is oriented to person, place, and time. He appears well-developed and well-nourished. No distress.  HENT:  Right Ear: Tympanic membrane, external ear and ear canal normal.  Left Ear: Tympanic membrane, external ear and ear canal normal.  Nose: Mucosal edema and rhinorrhea present. No sinus tenderness. No epistaxis. Right sinus exhibits maxillary sinus tenderness. Right sinus exhibits no frontal sinus tenderness. Left sinus exhibits maxillary sinus tenderness. Left sinus exhibits no frontal sinus tenderness.  Mouth/Throat: Uvula is midline and mucous membranes are  normal. Posterior oropharyngeal edema and posterior oropharyngeal erythema present. No oropharyngeal exudate or tonsillar abscesses.  Eyes: Conjunctivae and EOM are normal. Pupils are equal, round, and reactive to light. Right eye exhibits no discharge. No scleral icterus.  Neck: Neck supple. No thyromegaly present.  Cardiovascular: Normal rate, regular rhythm, normal heart sounds and intact distal pulses.   No murmur heard. Pulmonary/Chest: Effort normal and breath sounds normal. No respiratory distress. He has no wheezes.  Abdominal: He exhibits no distension.  Musculoskeletal: Normal range of motion. He exhibits no edema.  Lymphadenopathy:    He has no cervical adenopathy.  Neurological: He is alert and oriented to person, place, and time. Coordination normal.  Skin: Skin is warm and dry. No rash noted. He is not diaphoretic.  Psychiatric: He has a normal mood and affect. His behavior is normal.  Vitals reviewed.   Results for orders placed or performed in visit on 03/12/15  BMP8+EGFR  Result Value Ref Range   Glucose 84 65 - 99 mg/dL   BUN 11 6 - 24 mg/dL   Creatinine, Ser 0.55 (L) 0.76 - 1.27 mg/dL   GFR calc non Af Amer 116 >59 mL/min/1.73   GFR calc Af Amer 134 >59 mL/min/1.73   BUN/Creatinine Ratio 20 9 - 20   Sodium 141 136 - 144 mmol/L   Potassium 4.8 3.5 - 5.2 mmol/L   Chloride 96 (L) 97 - 106 mmol/L   CO2 33 (H) 18 - 29 mmol/L   Calcium 9.5 8.7 - 10.2 mg/dL      Assessment & Plan:   Problem List Items Addressed This Visit      Cardiovascular and Mediastinum   Essential hypertension    He feels flushed and wheezing in the morning and thought it was his blood pressure, his blood pressure looks great today. There is a possibility this could be related to his breathing and COPD coming on.      Relevant Medications   lisinopril (PRINIVIL,ZESTRIL) 20 MG tablet    Other Visit Diagnoses    COPD exacerbation ([place])    -  Primary    Relevant Medications    fluticasone  (FLONASE) 50 MCG/ACT nasal spray    PROAIR HFA 108 (90 Base) MCG/ACT inhaler    predniSONE (DELTASONE) 20 MG tablet        Follow up plan: Return in about 2 weeks (around 05/30/2015), or if symptoms worsen or fail to improve, for Breathing recheck.  Counseling provided for all of the vaccine components No orders of the defined types were placed in this encounter.    Caryl Pina, MD [facility] 05/16/2015, 11:54 AM

## 2015-05-16 NOTE — Assessment & Plan Note (Signed)
He feels flushed and wheezing in the morning and thought it was his blood pressure, his blood pressure looks great today. There is a possibility this could be related to his breathing and COPD coming on.

## 2015-05-17 ENCOUNTER — Telehealth: Payer: Self-pay | Admitting: Family Medicine

## 2015-05-21 NOTE — Telephone Encounter (Signed)
Patient complains that right finger tips turn white, become numb and are painful. They are not cold to the touch though.  Last night he lost feeling in his hand and dropped his drink. Feeling has since returned. No other symptoms.  Has been told this is poor circulation and would like a referral. I reviewed his last office note and this was not mentioned. Do you feel that you can refer him or should he be seen? He has an appt with Evelina Dun on 06/14/15.

## 2015-05-21 NOTE — Telephone Encounter (Signed)
I will be sure this is the right patient's name because there was another patient that I saw yesterday that I did do a hand referral for, but it was not [name] and I have not seen [name] since the 26th.

## 2015-05-22 ENCOUNTER — Encounter: Payer: Self-pay | Admitting: Family Medicine

## 2015-05-22 ENCOUNTER — Ambulatory Visit (INDEPENDENT_AMBULATORY_CARE_PROVIDER_SITE_OTHER): Payer: Medicare Other | Admitting: Family Medicine

## 2015-05-22 VITALS — BP 133/82 | HR 95 | Temp 98.1°F | Ht 70.0 in | Wt 237.0 lb

## 2015-05-22 DIAGNOSIS — M79641 Pain in right hand: Secondary | ICD-10-CM | POA: Diagnosis not present

## 2015-05-22 NOTE — Progress Notes (Signed)
BP 133/82 mmHg  Pulse 95  Temp(Src) 98.1 F (36.7 C) (Oral)  Ht '5\' 10"'  (1.778 m)  Wt 237 lb (107.502 kg)  BMI 34.01 kg/m2   Subjective:    Patient ID: [name], male    DOB: 1955/04/27, 60 y.o.   MRN: [id number]  HPI: [name] is a 60 y.o. male presenting on 05/22/2015 for Right hand tingling and turns white   HPI Right hand and finger pain Patient has been having pain and decreased grip strength in his right hand and his fingers on the right hand. He does not say any fingers more than the other. He does say that his had this worsening in the winter but he also gets it in the summer. He is wondered if it was a circulation problem or nerve problem. He does have a previous history of nerve issues in his neck. He said his fingers did turn more cold and white a couple of times but that also happened in the summer as well. He says the pain will sometimes shoot all the way up his hand into his shoulder into his neck. The pain is not present currently.  Relevant past medical, surgical, family and social history reviewed and updated as indicated. Interim medical history since our last visit reviewed. Allergies and medications reviewed and updated.  Review of Systems  Constitutional: Negative for fever.  HENT: Negative for ear discharge and ear pain.   Eyes: Negative for discharge and visual disturbance.  Respiratory: Negative for shortness of breath and wheezing.   Cardiovascular: Negative for chest pain and leg swelling.  Gastrointestinal: Negative for abdominal pain, diarrhea and constipation.  Genitourinary: Negative for difficulty urinating.  Musculoskeletal: Positive for myalgias and arthralgias. Negative for back pain and gait problem.  Skin: Negative for rash.  Neurological: Negative for syncope, light-headedness and headaches.  All other systems reviewed and are negative.   Per HPI unless specifically indicated above     Medication List       This list is  accurate as of: 05/22/15  4:19 PM.  Always use your most recent med list.               aspirin EC 81 MG tablet  Take 1 tablet (81 mg total) by mouth daily.     fluticasone 50 MCG/ACT nasal spray  Commonly known as:  FLONASE  Place 2 sprays into both nostrils daily.     lisinopril 20 MG tablet  Commonly known as:  PRINIVIL,ZESTRIL  Take 1 tablet (20 mg total) by mouth daily.     omeprazole 20 MG capsule  Commonly known as:  PRILOSEC  Take 1 capsule (20 mg total) by mouth daily.     predniSONE 20 MG tablet  Commonly known as:  DELTASONE  2 po at same time daily for 5 days     PROAIR HFA 108 (90 Base) MCG/ACT inhaler  Generic drug:  albuterol  Inhale 1-2 puffs into the lungs every 6 (six) hours as needed for wheezing or shortness of breath.     simvastatin 20 MG tablet  Commonly known as:  ZOCOR  Take 1 tablet (20 mg total) by mouth at bedtime.           Objective:    BP 133/82 mmHg  Pulse 95  Temp(Src) 98.1 F (36.7 C) (Oral)  Ht '5\' 10"'  (1.778 m)  Wt 237 lb (107.502 kg)  BMI 34.01 kg/m2  Wt Readings from Last 3 Encounters:  05/22/15 237 lb (107.502 kg)  05/16/15 237 lb 9.6 oz (107.775 kg)  03/12/15 226 lb 9.6 oz (102.785 kg)    Physical Exam  Constitutional: He is oriented to person, place, and time. He appears well-developed and well-nourished. No distress.  Eyes: Conjunctivae and EOM are normal. Pupils are equal, round, and reactive to light. Right eye exhibits no discharge. No scleral icterus.  Cardiovascular: Normal rate, regular rhythm, normal heart sounds and intact distal pulses.   No murmur heard. Allens test was normal on that right hand  Pulmonary/Chest: Effort normal and breath sounds normal. No respiratory distress. He has no wheezes.  Musculoskeletal: Normal range of motion. He exhibits no edema.  Neurological: He is alert and oriented to person, place, and time. He has normal strength. No sensory deficit. Coordination normal.  Skin: Skin is warm  and dry. No rash noted. He is not diaphoretic. No erythema. No pallor.  Psychiatric: He has a normal mood and affect. His behavior is normal.  Nursing note and vitals reviewed.   Results for orders placed or performed in visit on 03/12/15  [facility]  Result Value Ref Range   Glucose 84 65 - 99 mg/dL   BUN 11 6 - 24 mg/dL   Creatinine, Ser 0.55 (L) 0.76 - 1.27 mg/dL   GFR calc non Af Amer 116 >59 mL/min/1.73   GFR calc Af Amer 134 >59 mL/min/1.73   BUN/Creatinine Ratio 20 9 - 20   Sodium 141 136 - 144 mmol/L   Potassium 4.8 3.5 - 5.2 mmol/L   Chloride 96 (L) 97 - 106 mmol/L   CO2 33 (H) 18 - 29 mmol/L   Calcium 9.5 8.7 - 10.2 mg/dL      Assessment & Plan:   Problem List Items Addressed This Visit    None    Visit Diagnoses    Right hand pain    -  Primary    Concern for neuropathy versus Raynaud's, happens in summer and winter so we'll send to neurology    Relevant Orders    Ambulatory referral to Neurology       Follow up plan: Return if symptoms worsen or fail to improve.  Counseling provided for all of the vaccine components Orders Placed This Encounter  Procedures  . Ambulatory referral to Neurology    Caryl Pina, MD [facility] 05/22/2015, 4:19 PM

## 2015-05-22 NOTE — Telephone Encounter (Signed)
This could be part of a syndrome that we can actually treat or can be circulation problems. The patient will likely need to be seen to discuss this. If he wants to come in sooner than the 24th he can especially if it persists I would recommend. Caryl Pina, MD [facility] 05/22/2015, 8:20 AM

## 2015-05-22 NOTE — Telephone Encounter (Signed)
Pt agreed to be seen again, appt made for today.

## 2015-06-03 ENCOUNTER — Telehealth: Payer: Self-pay | Admitting: Family Medicine

## 2015-06-05 NOTE — Telephone Encounter (Signed)
FL2 complete, sister will pick up on 05/28/15

## 2015-06-13 DIAGNOSIS — M542 Cervicalgia: Secondary | ICD-10-CM | POA: Diagnosis not present

## 2015-06-13 DIAGNOSIS — I10 Essential (primary) hypertension: Secondary | ICD-10-CM | POA: Diagnosis not present

## 2015-06-13 DIAGNOSIS — G809 Cerebral palsy, unspecified: Secondary | ICD-10-CM | POA: Diagnosis not present

## 2015-06-13 DIAGNOSIS — E785 Hyperlipidemia, unspecified: Secondary | ICD-10-CM | POA: Diagnosis not present

## 2015-06-13 DIAGNOSIS — G5601 Carpal tunnel syndrome, right upper limb: Secondary | ICD-10-CM | POA: Diagnosis not present

## 2015-06-14 ENCOUNTER — Telehealth: Payer: Self-pay | Admitting: Family Medicine

## 2015-06-14 ENCOUNTER — Ambulatory Visit: Payer: Medicare Other | Admitting: Family

## 2015-06-14 NOTE — Telephone Encounter (Signed)
Patient informed we do not keep these hand braces in our stock

## 2015-06-18 ENCOUNTER — Encounter: Payer: Self-pay | Admitting: Family Medicine

## 2015-08-29 ENCOUNTER — Other Ambulatory Visit: Payer: Self-pay | Admitting: Family Medicine

## 2015-09-03 ENCOUNTER — Ambulatory Visit (INDEPENDENT_AMBULATORY_CARE_PROVIDER_SITE_OTHER): Payer: Medicare Other | Admitting: Family Medicine

## 2015-09-03 ENCOUNTER — Encounter: Payer: Self-pay | Admitting: Family Medicine

## 2015-09-03 VITALS — BP 123/72 | HR 91 | Temp 97.0°F | Ht 70.0 in | Wt 224.0 lb

## 2015-09-03 DIAGNOSIS — R7309 Other abnormal glucose: Secondary | ICD-10-CM | POA: Diagnosis not present

## 2015-09-03 DIAGNOSIS — R358 Other polyuria: Secondary | ICD-10-CM | POA: Diagnosis not present

## 2015-09-03 DIAGNOSIS — J441 Chronic obstructive pulmonary disease with (acute) exacerbation: Secondary | ICD-10-CM

## 2015-09-03 DIAGNOSIS — R3589 Other polyuria: Secondary | ICD-10-CM

## 2015-09-03 LAB — GLUCOSE HEMOCUE WAIVED: Glu Hemocue Waived: 107 mg/dL — ABNORMAL HIGH (ref 65–99)

## 2015-09-03 LAB — BAYER DCA HB A1C WAIVED: HB A1C (BAYER DCA - WAIVED): 5 % (ref <7.0)

## 2015-09-03 MED ORDER — FLUTICASONE PROPIONATE 50 MCG/ACT NA SUSP: 2.0000 | Freq: Every day | NASAL | Status: DC

## 2015-09-03 NOTE — Progress Notes (Signed)
   Subjective:    Patient ID: [name], male    DOB: 01-Jun-1955, 60 y.o.   MRN: [id number]  HPI  60 year old with recent onset of poly-tipsy on polyuria. Has had no weight loss or visual changes. Symptoms originate with him being put on lisinopril for his blood pressure area he does have a positive family history of diabetes and he is concerned about that possibility.  Patient Active Problem List   Diagnosis Date Noted  . History of MI (myocardial infarction) 02/26/2015  . Abdominal pain, epigastric 02/22/2015  . HYPERCHOLESTEROLEMIA 10/08/2009  . CEREBRAL PALSY 10/08/2009  . Essential hypertension 10/08/2009  . Coronary atherosclerosis 10/08/2009   Outpatient Encounter Prescriptions as of 09/03/2015  Medication Sig  . aspirin EC 81 MG tablet Take 1 tablet (81 mg total) by mouth daily.  . fluticasone (FLONASE) 50 MCG/ACT nasal spray Place 2 sprays into both nostrils daily.  [name] lisinopril (PRINIVIL,ZESTRIL) 20 MG tablet Take 1 tablet (20 mg total) by mouth daily.  [name] omeprazole (PRILOSEC) 20 MG capsule TAKE (1) CAPSULE DAILY  . PROAIR HFA 108 (90 Base) MCG/ACT inhaler Inhale 1-2 puffs into the lungs every 6 (six) hours as needed for wheezing or shortness of breath.  . simvastatin (ZOCOR) 20 MG tablet Take 1 tablet (20 mg total) by mouth at bedtime.  . [DISCONTINUED] predniSONE (DELTASONE) 20 MG tablet 2 po at same time daily for 5 days   No facility-administered encounter medications on file as of 09/03/2015.   '   Review of Systems  Constitutional: Negative.   Respiratory: Negative.   Cardiovascular: Negative.   Endocrine: Positive for polydipsia and polyuria.  Neurological: Negative.   Psychiatric/Behavioral: Negative.        Objective:   Physical Exam  Constitutional: He is oriented to person, place, and time. He appears well-developed and well-nourished.  Cardiovascular: Normal rate and regular rhythm.   Neurological: He is alert and oriented to person, place, and time.           Assessment & Plan:  1. Polyuria No much sugar at 107 and hemoglobin A1c of 5.0 do not support a diagnosis of diabetes. I believe the thirst is related to warmer weather working outside. I have tried to reassure him that there is no evidence of diabetes - Bayer DCA Hb A1c Waived - Glucose Hemocue Waived  2. COPD exacerbation ([place]) Requesting a refill on his topical nasal steroid - fluticasone (FLONASE) 50 MCG/ACT nasal spray; Place 2 sprays into both nostrils daily.  Dispense: 16 g; Refill:   Wardell Honour MD

## 2015-09-18 ENCOUNTER — Telehealth: Payer: Self-pay

## 2015-09-18 ENCOUNTER — Encounter: Payer: Self-pay | Admitting: Family Medicine

## 2015-09-18 ENCOUNTER — Ambulatory Visit (INDEPENDENT_AMBULATORY_CARE_PROVIDER_SITE_OTHER): Payer: Medicare Other | Admitting: Family Medicine

## 2015-09-18 DIAGNOSIS — S70912A Unspecified superficial injury of left hip, initial encounter: Secondary | ICD-10-CM | POA: Diagnosis not present

## 2015-09-18 DIAGNOSIS — L03116 Cellulitis of left lower limb: Secondary | ICD-10-CM | POA: Diagnosis not present

## 2015-09-18 DIAGNOSIS — W57XXXA Bitten or stung by nonvenomous insect and other nonvenomous arthropods, initial encounter: Secondary | ICD-10-CM | POA: Diagnosis not present

## 2015-09-18 MED ORDER — DOXYCYCLINE HYCLATE 100 MG PO TABS: 100.0000 mg | ORAL_TABLET | Freq: Two times a day (BID) | ORAL | Status: DC

## 2015-09-18 NOTE — Telephone Encounter (Signed)
Patient was seen today.  He had an appointment scheduled with a neurologist today but he missed the appointment because of lack of transportation.  Can you please call the neurologist and reschedule.  Also, can we arrange for him to have transportation through one of the local agencies?

## 2015-09-18 NOTE — Progress Notes (Signed)
There were no vitals taken for this visit.   Subjective:    Patient ID: [name], male    DOB: 21-Jul-1955, 60 y.o.   MRN: [id number]  HPI: [name] is a 60 y.o. male presenting on 09/18/2015 for Insect Bite   HPI Insect bite Patient has a tick bite on his left lateral hip and is developed redness and warmth and pain surrounding it. He pulled the tick out earlier this morning but did not know how long it was in him. He has been mowing the lawn over the past couple days. He denies any fevers or chills. He denies any shortness of breath or chest pain. There is swelling and redness around the site where the tick bite was.  Relevant past medical, surgical, family and social history reviewed and updated as indicated. Interim medical history since our last visit reviewed. Allergies and medications reviewed and updated.  Review of Systems  Constitutional: Negative for fever.  HENT: Negative for ear discharge and ear pain.   Eyes: Negative for discharge and visual disturbance.  Respiratory: Negative for shortness of breath and wheezing.   Cardiovascular: Negative for chest pain and leg swelling.  Gastrointestinal: Negative for abdominal pain, diarrhea and constipation.  Genitourinary: Negative for difficulty urinating.  Musculoskeletal: Negative for back pain and gait problem.  Skin: Positive for color change, rash and wound.  Neurological: Negative for syncope, light-headedness and headaches.  All other systems reviewed and are negative.   Per HPI unless specifically indicated above     Medication List       This list is accurate as of: 09/18/15  2:33 PM.  Always use your most recent med list.               aspirin EC 81 MG tablet  Take 1 tablet (81 mg total) by mouth daily.     doxycycline 100 MG tablet  Commonly known as:  VIBRA-TABS  Take 1 tablet (100 mg total) by mouth 2 (two) times daily.     fluticasone 50 MCG/ACT nasal spray  Commonly known as:  FLONASE   Place 2 sprays into both nostrils daily.     lisinopril 20 MG tablet  Commonly known as:  PRINIVIL,ZESTRIL  Take 1 tablet (20 mg total) by mouth daily.     omeprazole 20 MG capsule  Commonly known as:  PRILOSEC  TAKE (1) CAPSULE DAILY     PROAIR HFA 108 (90 Base) MCG/ACT inhaler  Generic drug:  albuterol  Inhale 1-2 puffs into the lungs every 6 (six) hours as needed for wheezing or shortness of breath.     simvastatin 20 MG tablet  Commonly known as:  ZOCOR  Take 1 tablet (20 mg total) by mouth at bedtime.           Objective:    There were no vitals taken for this visit.  Wt Readings from Last 3 Encounters:  09/03/15 224 lb (101.606 kg)  05/22/15 237 lb (107.502 kg)  05/16/15 237 lb 9.6 oz (107.775 kg)    Physical Exam  Constitutional: He is oriented to person, place, and time. He appears well-developed and well-nourished. No distress.  Eyes: Conjunctivae and EOM are normal. Pupils are equal, round, and reactive to light. Right eye exhibits no discharge. No scleral icterus.  Neck: Neck supple. No thyromegaly present.  Cardiovascular: Normal rate, regular rhythm, normal heart sounds and intact distal pulses.   No murmur heard. Pulmonary/Chest: Effort normal and breath sounds normal.  No respiratory distress. He has no wheezes.  Musculoskeletal: Normal range of motion. He exhibits no edema.  Lymphadenopathy:    He has no cervical adenopathy.  Neurological: He is alert and oriented to person, place, and time. Coordination normal.  Skin: Skin is warm and dry. Lesion (Very small central eschar left lateral hip) noted. No rash noted. He is not diaphoretic. There is erythema (5 cm circular surrounding erythema and swelling and warmth to palpation on left lateral hip).  Psychiatric: He has a normal mood and affect. His behavior is normal.  Nursing note and vitals reviewed.     Assessment & Plan:   Problem List Items Addressed This Visit    None    Visit Diagnoses    Tick  bite    -  Primary    Left hip    Relevant Medications    doxycycline (VIBRA-TABS) 100 MG tablet    Cellulitis of left lower extremity        5 cm round surrounding tick bite on left hip    Relevant Medications    doxycycline (VIBRA-TABS) 100 MG tablet       Follow up plan: Return if symptoms worsen or fail to improve.  Counseling provided for all of the vaccine components No orders of the defined types were placed in this encounter.    Caryl Pina, MD [facility] 09/18/2015, 2:33 PM

## 2015-10-09 ENCOUNTER — Ambulatory Visit: Payer: Medicare Other | Admitting: Physician Assistant

## 2015-10-09 ENCOUNTER — Ambulatory Visit (INDEPENDENT_AMBULATORY_CARE_PROVIDER_SITE_OTHER): Payer: Medicare Other | Admitting: Physician Assistant

## 2015-10-09 VITALS — BP 115/69 | HR 92 | Temp 97.1°F | Ht 70.0 in | Wt 223.0 lb

## 2015-10-09 DIAGNOSIS — S70912A Unspecified superficial injury of left hip, initial encounter: Secondary | ICD-10-CM

## 2015-10-09 DIAGNOSIS — W57XXXA Bitten or stung by nonvenomous insect and other nonvenomous arthropods, initial encounter: Secondary | ICD-10-CM

## 2015-10-09 NOTE — Patient Instructions (Signed)
Tick Bite Information Ticks are insects that attach themselves to the skin and draw blood for food. There are various types of ticks. Common types include wood ticks and deer ticks. Most ticks live in shrubs and grassy areas. Ticks can climb onto your body when you make contact with leaves or grass where the tick is waiting. The most common places on the body for ticks to attach themselves are the scalp, neck, armpits, waist, and groin. Most tick bites are harmless, but sometimes ticks carry germs that cause diseases. These germs can be spread to a person during the tick's feeding process. The chance of a disease spreading through a tick bite depends on:   The type of tick.  Time of year.   How long the tick is attached.   Geographic location.  HOW CAN YOU PREVENT TICK BITES? Take these steps to help prevent tick bites when you are outdoors:  Wear protective clothing. Long sleeves and long pants are best.   Wear white clothes so you can see ticks more easily.  Tuck your pant legs into your socks.   If walking on a trail, stay in the middle of the trail to avoid brushing against bushes.  Avoid walking through areas with long grass.  Put insect repellent on all exposed skin and along boot tops, pant legs, and sleeve cuffs.   Check clothing, hair, and skin repeatedly and before going inside.   Brush off any ticks that are not attached.  Take a shower or bath as soon as possible after being outdoors.  WHAT IS THE PROPER WAY TO REMOVE A TICK? Ticks should be removed as soon as possible to help prevent diseases caused by tick bites. 1. If latex gloves are available, put them on before trying to remove a tick.  2. Using fine-point tweezers, grasp the tick as close to the skin as possible. You may also use curved forceps or a tick removal tool. Grasp the tick as close to its head as possible. Avoid grasping the tick on its body. 3. Pull gently with steady upward pressure until  the tick lets go. Do not twist the tick or jerk it suddenly. This may break off the tick's head or mouth parts. 4. Do not squeeze or crush the tick's body. This could force disease-carrying fluids from the tick into your body.  5. After the tick is removed, wash the bite area and your hands with soap and water or other disinfectant such as alcohol. 6. Apply a small amount of antiseptic cream or ointment to the bite site.  7. Wash and disinfect any instruments that were used.  Do not try to remove a tick by applying a hot match, petroleum jelly, or fingernail polish to the tick. These methods do not work and may increase the chances of disease being spread from the tick bite.  WHEN SHOULD YOU SEEK MEDICAL CARE? Contact your health care provider if you are unable to remove a tick from your skin or if a part of the tick breaks off and is stuck in the skin.  After a tick bite, you need to be aware of signs and symptoms that could be related to diseases spread by ticks. Contact your health care provider if you develop any of the following in the days or weeks after the tick bite:  Unexplained fever.  Rash. A circular rash that appears days or weeks after the tick bite may indicate the possibility of Lyme disease. The rash may resemble   a target with a bull's-eye and may occur at a different part of your body than the tick bite.  Redness and swelling in the area of the tick bite.   Tender, swollen lymph glands.   Diarrhea.   Weight loss.   Cough.   Fatigue.   Muscle, joint, or bone pain.   Abdominal pain.   Headache.   Lethargy or a change in your level of consciousness.  Difficulty walking or moving your legs.   Numbness in the legs.   Paralysis.  Shortness of breath.   Confusion.   Repeated vomiting.    This information is not intended to replace advice given to you by your health care provider. Make sure you discuss any questions you have with your health  care provider.   Document Released: 04/03/2000 Document Revised: 04/27/2014 Document Reviewed: 09/14/2012 Elsevier Interactive Patient Education 2016 [employer]  

## 2015-10-09 NOTE — Progress Notes (Signed)
Subjective:     Patient ID: [name], male   DOB: Jul 12, 1955, 60 y.o.   MRN: [id number]  HPI Pt here for f/u of tick bite to the hip Also he has not been able to f/u with his Neuro for his CP He states his sister always backs out at the last minute   Review of Systems Denies any pain, redness, or swelling to the area No fever, chills, rashes or joint pain    Objective:   Physical Exam No erythema or edema seen tot he L lateral hip No ulceration noted    Assessment:     1. Tick bite        Plan:     No further therapy for the tick bite Regarding his CP will have him get in touch with referral here to see if they is some services to get him to his appt F/U prn

## 2015-10-29 ENCOUNTER — Encounter: Payer: Self-pay | Admitting: Family

## 2015-10-29 ENCOUNTER — Ambulatory Visit (INDEPENDENT_AMBULATORY_CARE_PROVIDER_SITE_OTHER): Payer: Medicare Other | Admitting: Family

## 2015-10-29 VITALS — BP 119/73 | HR 93 | Temp 97.6°F | Ht 70.0 in | Wt 221.0 lb

## 2015-10-29 DIAGNOSIS — R252 Cramp and spasm: Secondary | ICD-10-CM

## 2015-10-29 LAB — CMP14+EGFR
ALBUMIN: 4.3 g/dL (ref 3.5–5.5)
ALT: 24 IU/L (ref 0–44)
AST: 28 IU/L (ref 0–40)
Albumin/Globulin Ratio: 1.5 (ref 1.2–2.2)
Alkaline Phosphatase: 69 IU/L (ref 39–117)
BILIRUBIN TOTAL: 1.1 mg/dL (ref 0.0–1.2)
BUN / CREAT RATIO: 10 (ref 9–20)
BUN: 8 mg/dL (ref 6–24)
CHLORIDE: 96 mmol/L (ref 96–106)
CO2: 25 mmol/L (ref 18–29)
CREATININE: 0.83 mg/dL (ref 0.76–1.27)
Calcium: 9.2 mg/dL (ref 8.7–10.2)
GFR calc Af Amer: 113 mL/min/{1.73_m2} (ref >59)
GFR calc non Af Amer: 98 mL/min/{1.73_m2} (ref >59)
GLUCOSE: 89 mg/dL (ref 65–99)
Globulin, Total: 2.9 g/dL (ref 1.5–4.5)
Potassium: 4.4 mmol/L (ref 3.5–5.2)
Sodium: 138 mmol/L (ref 134–144)
TOTAL PROTEIN: 7.2 g/dL (ref 6.0–8.5)

## 2015-10-29 MED ORDER — CYCLOBENZAPRINE HCL 5 MG PO TABS: 5.0000 mg | ORAL_TABLET | Freq: Three times a day (TID) | ORAL | Status: DC | PRN

## 2015-10-29 NOTE — Progress Notes (Signed)
   Subjective:    Patient ID: [name], male    DOB: Jul 17, 1955, 60 y.o.   MRN: [id number]  HPI Pt presents to the office today with new onset of bilateral leg cramps. PT states this started two days ago and has become worse. PT states it is constant and is having aching 10 out 10 pain. PT denies any injury. PT states he has worked a lot of outside, but has drank "a lot of water". About 2 L or more of water a day. Pt is on statin, but has been on it years. Pt states he may drank one beer "every once in awhile", but denies drinking every day.    Review of Systems  Constitutional: Negative.   HENT: Negative.   Respiratory: Negative.   Cardiovascular: Negative.   Gastrointestinal: Negative.   Endocrine: Negative.   Genitourinary: Negative.   Musculoskeletal: Negative.   Neurological: Negative.   Hematological: Negative.   Psychiatric/Behavioral: Negative.   All other systems reviewed and are negative.      Objective:   Physical Exam  Constitutional: He is oriented to person, place, and time. He appears well-developed and well-nourished. No distress.  HENT:  Head: Normocephalic.  Eyes: Pupils are equal, round, and reactive to light. Right eye exhibits no discharge. Left eye exhibits no discharge.  Neck: Normal range of motion. Neck supple. No thyromegaly present.  Cardiovascular: Normal rate, regular rhythm, normal heart sounds and intact distal pulses.   No murmur heard. Pulmonary/Chest: Effort normal and breath sounds normal. No respiratory distress. He has no wheezes.  Abdominal: Soft. Bowel sounds are normal. He exhibits no distension. There is no tenderness.  Musculoskeletal: Normal range of motion. He exhibits no edema or tenderness.  Neurological: He is alert and oriented to person, place, and time. He has normal reflexes. No cranial nerve deficit.  Skin: Skin is warm and dry. No rash noted. No erythema.  Psychiatric: He has a normal mood and affect. His behavior is  normal. Judgment and thought content normal.  Vitals reviewed.   BP 119/73 mmHg  Pulse 93  Temp(Src) 97.6 F (36.4 C) (Oral)  Ht _0  (1.778 m)  Wt 221 lb (100.245 kg)  BMI 31.71 kg/m2       Assessment & Plan:  1. Bilateral leg cramps -Rest -Force fluids -Labs pending -Flexeril given as needed for muscle spasm  -RTO prn -Avoid alcohol - CMP14+EGFR - cyclobenzaprine (FLEXERIL) 5 MG tablet; Take 1 tablet (5 mg total) by mouth 3 (three) times daily as needed for muscle spasms.  Dispense: 30 tablet; Refill: 0  Evelina Dun, FNP

## 2015-10-29 NOTE — Patient Instructions (Signed)

## 2015-10-31 DIAGNOSIS — G5603 Carpal tunnel syndrome, bilateral upper limbs: Secondary | ICD-10-CM | POA: Diagnosis not present

## 2015-10-31 DIAGNOSIS — G5602 Carpal tunnel syndrome, left upper limb: Secondary | ICD-10-CM | POA: Diagnosis not present

## 2015-10-31 DIAGNOSIS — G5601 Carpal tunnel syndrome, right upper limb: Secondary | ICD-10-CM | POA: Diagnosis not present

## 2015-12-05 ENCOUNTER — Ambulatory Visit (INDEPENDENT_AMBULATORY_CARE_PROVIDER_SITE_OTHER): Payer: Medicare Other | Admitting: Family Medicine

## 2015-12-19 ENCOUNTER — Ambulatory Visit (INDEPENDENT_AMBULATORY_CARE_PROVIDER_SITE_OTHER): Payer: Medicare Other | Admitting: Family Medicine

## 2015-12-19 ENCOUNTER — Encounter: Payer: Self-pay | Admitting: Family Medicine

## 2015-12-19 VITALS — BP 106/75 | HR 87 | Temp 98.1°F | Ht 70.0 in | Wt 219.4 lb

## 2015-12-19 DIAGNOSIS — F4321 Adjustment disorder with depressed mood: Secondary | ICD-10-CM | POA: Diagnosis not present

## 2015-12-19 MED ORDER — HYDROXYZINE HCL 25 MG PO TABS: 25.0000 mg | ORAL_TABLET | Freq: Three times a day (TID) | ORAL | 2 refills | Status: DC | PRN

## 2015-12-19 NOTE — Progress Notes (Signed)
BP 106/75   Pulse 87   Temp 98.1 F (36.7 C) (Oral)   Ht 5\' 10"  (1.778 m)   Wt 219 lb 6.4 oz (99.5 kg)   BMI 31.48 kg/m    Subjective:    Patient ID: [name], male    DOB: 1955/07/23, 60 y.o.   MRN: [id number]  HPI: LAWSON HOGREFE is a 60 y.o. male presenting on 12/19/2015 for Depression (mother passed away last week)   HPI Sadness and grief Patient has been having sadness and grief and feeling down since his mother passed away to go. He was so down that one of his siblings gave him one of their Xanax is and he took that just this morning has been feeling better. He denies any suicidal ideations or thoughts of hurting himself. He denies any feelings of hopelessness or helplessness but just really sad because his mother passed and it was unexpectedly.  Relevant past medical, surgical, family and social history reviewed and updated as indicated. Interim medical history since our last visit reviewed. Allergies and medications reviewed and updated.  Review of Systems  Constitutional: Negative for chills and fever.  Respiratory: Negative for shortness of breath and wheezing.   Cardiovascular: Negative for chest pain and leg swelling.  Musculoskeletal: Negative for back pain and gait problem.  Skin: Negative for rash.  Psychiatric/Behavioral: Positive for decreased concentration and dysphoric mood. Negative for self-injury, sleep disturbance and suicidal ideas. The patient is nervous/anxious.   All other systems reviewed and are negative.   Per HPI unless specifically indicated above     Medication List       Accurate as of 12/19/15  4:24 PM. Always use your most recent med list.          aspirin EC 81 MG tablet Take 1 tablet (81 mg total) by mouth daily.   cyclobenzaprine 5 MG tablet Commonly known as:  FLEXERIL Take 1 tablet (5 mg total) by mouth 3 (three) times daily as needed for muscle spasms.   fluticasone 50 MCG/ACT nasal spray Commonly known as:   FLONASE Place 2 sprays into both nostrils daily.   hydrOXYzine 25 MG tablet Commonly known as:  ATARAX/VISTARIL Take 1 tablet (25 mg total) by mouth 3 (three) times daily as needed.   lisinopril 20 MG tablet Commonly known as:  PRINIVIL,ZESTRIL Take 1 tablet (20 mg total) by mouth daily.   omeprazole 20 MG capsule Commonly known as:  PRILOSEC TAKE (1) CAPSULE DAILY   PROAIR HFA 108 (90 Base) MCG/ACT inhaler Generic drug:  albuterol Inhale 1-2 puffs into the lungs every 6 (six) hours as needed for wheezing or shortness of breath.   simvastatin 20 MG tablet Commonly known as:  ZOCOR Take 1 tablet (20 mg total) by mouth at bedtime.          Objective:    BP 106/75   Pulse 87   Temp 98.1 F (36.7 C) (Oral)   Ht 5\' 10"  (1.778 m)   Wt 219 lb 6.4 oz (99.5 kg)   BMI 31.48 kg/m   Wt Readings from Last 3 Encounters:  12/19/15 219 lb 6.4 oz (99.5 kg)  10/29/15 221 lb (100.2 kg)  10/09/15 223 lb (101.2 kg)    Physical Exam  Constitutional: He is oriented to person, place, and time. He appears well-developed and well-nourished. No distress.  Eyes: Conjunctivae are normal. Right eye exhibits no discharge. No scleral icterus.  Cardiovascular: Normal rate, regular rhythm, normal heart sounds and  intact distal pulses.   No murmur heard. Pulmonary/Chest: Effort normal and breath sounds normal. No respiratory distress. He has no wheezes.  Musculoskeletal: Normal range of motion. He exhibits no edema.  Neurological: He is alert and oriented to person, place, and time. Coordination normal.  Skin: Skin is warm and dry. No rash noted. He is not diaphoretic.  Psychiatric: His behavior is normal. Judgment and thought content normal. His mood appears anxious. He exhibits a depressed mood. He expresses no suicidal ideation. He expresses no suicidal plans.  Nursing note and vitals reviewed.     Assessment & Plan:   Problem List Items Addressed This Visit    None    Visit Diagnoses     Grief reaction    -  Primary   Will give Atarax, also recommended counseling   Relevant Medications   hydrOXYzine (ATARAX/VISTARIL) 25 MG tablet       Follow up plan: Return if symptoms worsen or fail to improve.  Counseling provided for all of the vaccine components No orders of the defined types were placed in this encounter.   Caryl Pina, MD [facility] 12/19/2015, 4:24 PM

## 2015-12-20 DIAGNOSIS — G809 Cerebral palsy, unspecified: Secondary | ICD-10-CM | POA: Diagnosis not present

## 2015-12-20 DIAGNOSIS — G5601 Carpal tunnel syndrome, right upper limb: Secondary | ICD-10-CM | POA: Diagnosis not present

## 2015-12-20 DIAGNOSIS — M542 Cervicalgia: Secondary | ICD-10-CM | POA: Diagnosis not present

## 2015-12-20 DIAGNOSIS — M79603 Pain in arm, unspecified: Secondary | ICD-10-CM | POA: Diagnosis not present

## 2015-12-20 DIAGNOSIS — I10 Essential (primary) hypertension: Secondary | ICD-10-CM | POA: Diagnosis not present

## 2015-12-20 DIAGNOSIS — G5610 Other lesions of median nerve, unspecified upper limb: Secondary | ICD-10-CM | POA: Diagnosis not present

## 2015-12-20 DIAGNOSIS — E785 Hyperlipidemia, unspecified: Secondary | ICD-10-CM | POA: Diagnosis not present

## 2015-12-26 ENCOUNTER — Encounter: Payer: Self-pay | Admitting: Nurse Practitioner

## 2015-12-26 ENCOUNTER — Ambulatory Visit (INDEPENDENT_AMBULATORY_CARE_PROVIDER_SITE_OTHER): Payer: Medicare Other | Admitting: Nurse Practitioner

## 2015-12-26 VITALS — BP 145/89 | HR 87 | Temp 96.5°F | Ht 70.0 in | Wt 221.0 lb

## 2015-12-26 DIAGNOSIS — J0101 Acute recurrent maxillary sinusitis: Secondary | ICD-10-CM | POA: Diagnosis not present

## 2015-12-26 MED ORDER — AZITHROMYCIN 250 MG PO TABS
ORAL_TABLET | ORAL | 0 refills | Status: DC
Start: 2015-12-26 — End: 2016-01-02

## 2015-12-26 NOTE — Progress Notes (Signed)
Subjective:     [name] is a 60 y.o. male who presents for evaluation of sinus pain. Symptoms include: congestion, cough, nasal congestion, sinus pressure and sore throat. Onset of symptoms was 1 week ago. Symptoms have been gradually worsening since that time. Past history is significant for no history of pneumonia or bronchitis. Patient is a smoker  (1/2  ppd x 30 yrs).  The following portions of the patient's history were reviewed and updated as appropriate: allergies, current medications, past family history, past medical history, past social history, past surgical history and problem list.  Review of Systems Pertinent items noted in HPI and remainder of comprehensive ROS otherwise negative.   Objective:    BP (!) 145/89   Pulse 87   Temp (!) 96.5 F (35.8 C) (Oral)   Ht 5\' 10"  (1.778 m)   Wt 221 lb (100.2 kg)   BMI 31.71 kg/m  General appearance: alert and cooperative Eyes: conjunctivae/corneas clear. PERRL, EOM's intact. Fundi benign. Ears: normal TM's and external ear canals both ears Nose: clear discharge, moderate congestion, turbinates red, sinus tenderness bilateral Throat: lips, mucosa, and tongue normal; teeth and gums normal Neck: no adenopathy, no carotid bruit, no JVD, supple, symmetrical, trachea midline and thyroid not enlarged, symmetric, no tenderness/mass/nodules Lungs: clear to auscultation bilaterally Heart: regular rate and rhythm, S1, S2 normal, no murmur, click, rub or gallop    Assessment:    Acute bacterial sinusitis.    Plan:   1. Take meds as prescribed 2. Use a cool mist humidifier especially during the winter months and when heat has been humid. 3. Use saline nose sprays frequently 4. Saline irrigations of the nose can be very helpful if done frequently.  * 4X daily for 1 week*  * Use of a nettie pot can be helpful with this. Follow directions with this* 5. Drink plenty of fluids 6. Keep thermostat turn down low 7.For any cough or  congestion  Use plain Mucinex- regular strength or max strength is fine   * Children- consult with Pharmacist for dosing 8. For fever or aces or pains- take tylenol or ibuprofen appropriate for age and weight.  * for fevers greater than 101 orally you may alternate ibuprofen and tylenol every  3 hours.   Meds ordered this encounter  Medications  . azithromycin (ZITHROMAX) 250 MG tablet    Sig: Two tablets day one, then one tablet daily next 4 days.    Dispense:  6 tablet    Refill:  0    Order Specific Question:   Supervising Provider    Answer:   Eustaquio Maize [[id number]]   Mary-Margaret Hassell Done, FNP

## 2015-12-26 NOTE — Patient Instructions (Signed)

## 2015-12-27 ENCOUNTER — Other Ambulatory Visit: Payer: Self-pay | Admitting: Nurse Practitioner

## 2015-12-27 ENCOUNTER — Telehealth: Payer: Self-pay | Admitting: Nurse Practitioner

## 2015-12-27 DIAGNOSIS — J0101 Acute recurrent maxillary sinusitis: Secondary | ICD-10-CM

## 2015-12-30 ENCOUNTER — Other Ambulatory Visit: Payer: Self-pay | Admitting: Family Medicine

## 2015-12-30 MED ORDER — AMOXICILLIN-POT CLAVULANATE 875-125 MG PO TABS: 1.0000 | ORAL_TABLET | Freq: Two times a day (BID) | ORAL | 0 refills | Status: DC

## 2015-12-30 NOTE — Telephone Encounter (Signed)
Authorize 30 days only. Then contact the patient letting them know that they will need an appointment before any further prescriptions can be sent in. 

## 2015-12-30 NOTE — Telephone Encounter (Signed)
Can you please send in rx to pharmacy or dose for me and I will send in and route to pool B

## 2015-12-30 NOTE — Telephone Encounter (Signed)
Patient notified that rx sent to pharmacy 

## 2015-12-30 NOTE — Telephone Encounter (Signed)
I sent augmentin to pharmacy. Thanks,WS

## 2016-01-02 ENCOUNTER — Encounter: Payer: Self-pay | Admitting: Family Medicine

## 2016-01-02 ENCOUNTER — Ambulatory Visit (INDEPENDENT_AMBULATORY_CARE_PROVIDER_SITE_OTHER): Payer: Medicare Other | Admitting: Family Medicine

## 2016-01-02 VITALS — BP 118/79 | HR 96 | Temp 97.3°F | Ht 70.0 in | Wt 215.0 lb

## 2016-01-02 DIAGNOSIS — I25119 Atherosclerotic heart disease of native coronary artery with unspecified angina pectoris: Secondary | ICD-10-CM

## 2016-01-02 DIAGNOSIS — I252 Old myocardial infarction: Secondary | ICD-10-CM

## 2016-01-02 DIAGNOSIS — G809 Cerebral palsy, unspecified: Secondary | ICD-10-CM

## 2016-01-02 NOTE — Progress Notes (Signed)
Subjective:    Patient ID: [name], male    DOB: 10-19-1955, 60 y.o.   MRN: [id number]  HPI Patient here today for follow up and paperwork for disability. He is accompanied today by a family friend.  Papers that he brings today are related to need for personal care services and are not really disability papers. His income comes from [place] from his other and will probably come from his mother who recently died. He lives by himself does his own cooking and activities of daily living. It is his hope that a niece who works for [employer] can come and help him with some of his housework. He has had cerebral palsy since birth and also has had history of coronary artery disease with myocardial infarction in November 2016.   Patient Active Problem List   Diagnosis Date Noted  . History of MI (myocardial infarction) 02/26/2015  . Abdominal pain, epigastric 02/22/2015  . HYPERCHOLESTEROLEMIA 10/08/2009  . CEREBRAL PALSY 10/08/2009  . Essential hypertension 10/08/2009  . Coronary atherosclerosis 10/08/2009   Outpatient Encounter Prescriptions as of 01/02/2016  Medication Sig  . amoxicillin-clavulanate (AUGMENTIN) 875-125 MG tablet Take 1 tablet by mouth 2 (two) times daily. Take all of this medication  . aspirin EC 81 MG tablet Take 1 tablet (81 mg total) by mouth daily.  [name] azithromycin (ZITHROMAX) 250 MG tablet   . cyclobenzaprine (FLEXERIL) 5 MG tablet Take 1 tablet (5 mg total) by mouth 3 (three) times daily as needed for muscle spasms.  . fluticasone (FLONASE) 50 MCG/ACT nasal spray Place 2 sprays into both nostrils daily.  . hydrOXYzine (ATARAX/VISTARIL) 25 MG tablet Take 1 tablet (25 mg total) by mouth 3 (three) times daily as needed.  [name] lisinopril (PRINIVIL,ZESTRIL) 20 MG tablet Take 1 tablet (20 mg total) by mouth daily.  [name] omeprazole (PRILOSEC) 20 MG capsule TAKE (1) CAPSULE DAILY  . PROAIR HFA 108 (90 Base) MCG/ACT inhaler Inhale 1-2 puffs into the  lungs every 6 (six) hours as needed for wheezing or shortness of breath.  . simvastatin (ZOCOR) 20 MG tablet Take 1 tablet (20 mg total) by mouth at bedtime.  . [DISCONTINUED] azithromycin (ZITHROMAX) 250 MG tablet Two tablets day one, then one tablet daily next 4 days.   No facility-administered encounter medications on file as of 01/02/2016.      Review of Systems  Constitutional: Negative.   HENT: Negative.   Eyes: Negative.   Respiratory: Negative.   Cardiovascular: Negative.   Gastrointestinal: Negative.   Endocrine: Negative.   Genitourinary: Negative.   Musculoskeletal: Negative.   Skin: Negative.   Allergic/Immunologic: Negative.   Neurological: Negative.   Hematological: Negative.   Psychiatric/Behavioral: Negative.        Objective:   Physical Exam  Constitutional: He is oriented to person, place, and time. He appears well-developed and well-nourished.  HENT:  Mouth/Throat: Oropharynx is clear and moist.  Cardiovascular: Normal rate, regular rhythm and normal heart sounds.   Pulmonary/Chest: Effort normal and breath sounds normal.  Abdominal: Soft. Bowel sounds are normal. There is no tenderness.  Musculoskeletal:  Has some contracture involving left upper extremity and walks with a limp.  Neurological: He is alert and oriented to person, place, and time.  Psychiatric: He has a normal mood and affect. His behavior is normal.   BP 118/79 (BP Location: Right Arm)   Pulse 96   Temp 97.3 F (36.3 C) (Oral)   Ht 5\' 10"  (1.778 m)  Wt 215 lb (97.5 kg)   BMI 30.85 kg/m         Assessment & Plan:  1. Atherosclerosis of native coronary artery of native heart with angina pectoris [facility]) He denies any recent or current chest pain. Medications include simvastatin and lisinopril  2. Cerebral palsy, unspecified type [facility]) Patient seems well adapted to his disabilities.  3. History of MI (myocardial infarction) MI was last year. No current symptoms  Wardell Honour MD

## 2016-01-14 ENCOUNTER — Telehealth: Payer: Self-pay | Admitting: Family Medicine

## 2016-01-17 NOTE — Telephone Encounter (Signed)
[employer] printed her a copy and faxed again because she said they have not heard anything

## 2016-03-31 ENCOUNTER — Encounter: Payer: Self-pay | Admitting: Family Medicine

## 2016-03-31 ENCOUNTER — Ambulatory Visit: Payer: Medicare Other | Admitting: Family Medicine

## 2016-03-31 ENCOUNTER — Ambulatory Visit (INDEPENDENT_AMBULATORY_CARE_PROVIDER_SITE_OTHER): Payer: Medicare Other | Admitting: Family Medicine

## 2016-03-31 VITALS — BP 130/84 | HR 100 | Temp 97.9°F | Ht 70.0 in | Wt 222.0 lb

## 2016-03-31 DIAGNOSIS — I25119 Atherosclerotic heart disease of native coronary artery with unspecified angina pectoris: Secondary | ICD-10-CM | POA: Diagnosis not present

## 2016-03-31 DIAGNOSIS — F4320 Adjustment disorder, unspecified: Secondary | ICD-10-CM

## 2016-03-31 DIAGNOSIS — F411 Generalized anxiety disorder: Secondary | ICD-10-CM

## 2016-03-31 DIAGNOSIS — F4321 Adjustment disorder with depressed mood: Secondary | ICD-10-CM

## 2016-03-31 DIAGNOSIS — J449 Chronic obstructive pulmonary disease, unspecified: Secondary | ICD-10-CM

## 2016-03-31 MED ORDER — PROAIR HFA 108 (90 BASE) MCG/ACT IN AERS
1.0000 | INHALATION_SPRAY | Freq: Four times a day (QID) | RESPIRATORY_TRACT | 2 refills | Status: DC | PRN
Start: 2016-03-31 — End: 2017-04-26

## 2016-03-31 MED ORDER — DULOXETINE HCL 30 MG PO CPEP: 30.0000 mg | ORAL_CAPSULE | Freq: Every day | ORAL | 0 refills | Status: DC

## 2016-03-31 NOTE — Progress Notes (Signed)
Subjective:  Patient ID: [name], male    DOB: [birth year]  Age: 60 y.o. MRN: [id number]  CC: Anxiety   HPI Gregor Reyner Daluz presents for chest pain substernal 3 days ago. Was very upset. Holidays approaching and feeling deep grief over recent loss of parents.Denied radiation. No exertional component  GAD 7 : Generalized Anxiety Score 03/31/2016  Nervous, Anxious, on Edge 3  Control/stop worrying 3  Worry too much - different things 3  Trouble relaxing 2  Restless 2  Easily annoyed or irritable 0  Afraid - awful might happen 0  Total GAD 7 Score 13  Anxiety Difficulty Not difficult at all      History Vardaan has a past medical history of Cerebral palsy ([place]); Coronary artery disease; Depression; GERD (gastroesophageal reflux disease); Hypertension; Scoliosis; and Seizures ([place]).   He has a past surgical history that includes Coronary angioplasty with stent.   His family history includes Alcohol abuse in his brother; CAD in his brother; CAD (age of onset: 90) in his father; Diabetes in his mother; Heart disease in his father.He reports that he has been smoking Cigarettes.  He has been smoking about 1.00 pack per day. He has never used smokeless tobacco. He reports that he drinks alcohol. He reports that he does not use drugs.    ROS Review of Systems  Constitutional: Negative for chills, diaphoresis, fever and unexpected weight change.  HENT: Negative for congestion, hearing loss, rhinorrhea and sore throat.   Eyes: Negative for visual disturbance.  Respiratory: Negative for cough and shortness of breath.   Cardiovascular: Negative for chest pain.  Gastrointestinal: Negative for abdominal pain, constipation and diarrhea.  Genitourinary: Negative for dysuria and flank pain.  Musculoskeletal: Negative for arthralgias, joint swelling and myalgias.  Skin: Negative for rash.  Neurological: Negative for dizziness, weakness and headaches.  Psychiatric/Behavioral: Negative  for dysphoric mood and sleep disturbance.    Objective:  BP 130/84   Pulse 100   Temp 97.9 F (36.6 C) (Oral)   Ht 5\' 10"  (1.778 m)   Wt 222 lb (100.7 kg)   BMI 31.85 kg/m   BP Readings from Last 3 Encounters:  03/31/16 130/84  01/02/16 118/79  12/26/15 (!) 145/89    Wt Readings from Last 3 Encounters:  03/31/16 222 lb (100.7 kg)  01/02/16 215 lb (97.5 kg)  12/26/15 221 lb (100.2 kg)     Physical Exam  Constitutional: He is oriented to person, place, and time. He appears well-developed and well-nourished. No distress.  HENT:  Head: Normocephalic and atraumatic.  Right Ear: Tympanic membrane and external ear normal. No decreased hearing is noted.  Left Ear: Tympanic membrane and external ear normal. No decreased hearing is noted.  Nose: Nose normal.  Mouth/Throat: Oropharynx is clear and moist. No oropharyngeal exudate or posterior oropharyngeal erythema.  Eyes: Conjunctivae and EOM are normal. Pupils are equal, round, and reactive to light.  Neck: Normal range of motion. Neck supple. No thyromegaly present.  Cardiovascular: Normal rate, regular rhythm and normal heart sounds.   No murmur heard. Pulmonary/Chest: Effort normal and breath sounds normal. No respiratory distress. He has no wheezes. He has no rales.  Abdominal: Soft. Bowel sounds are normal. He exhibits no distension and no mass. There is no tenderness.  Lymphadenopathy:    He has no cervical adenopathy.  Neurological: He is alert and oriented to person, place, and time. He has normal reflexes.  Skin: Skin is warm and dry.  Psychiatric: He has  a normal mood and affect. His behavior is normal. Judgment and thought content normal.  Vitals reviewed.    Lab Results  Component Value Date   WBC 10.2 02/22/2015   HGB 16.3 06/23/2013   HCT 46.6 02/22/2015   PLT 226 02/22/2015   GLUCOSE 89 10/29/2015   CHOL 160 02/26/2015   TRIG 148 02/26/2015   HDL 33 (L) 02/26/2015   LDLCALC 97 02/26/2015   ALT 24  10/29/2015   AST 28 10/29/2015   NA 138 10/29/2015   K 4.4 10/29/2015   CL 96 10/29/2015   CREATININE 0.83 10/29/2015   BUN 8 10/29/2015   CO2 25 10/29/2015   TSH 1.200 06/23/2013   PSA 0.3 06/23/2013   INR 1.17 09/01/2009   HGBA1C  09/01/2009    5.3 (NOTE)                                                                       According to the ADA Clinical Practice Recommendations for 2011, when HbA1c is used as a screening test:   >=6.5%   Diagnostic of Diabetes Mellitus           (if abnormal result  is confirmed)  5.7-6.4%   Increased risk of developing Diabetes Mellitus  References:Diagnosis and Classification of Diabetes Mellitus,Diabetes [id number] 1):S62-S69 and Standards of Medical Care in         Diabetes - 2011,Diabetes Care,2011,34  (Suppl 1):S11-S61.    Ct Chest W Contrast  Result Date: 08/22/2013 CLINICAL DATA:  Hemoptysis.  Chronic tobacco use. EXAM: CT CHEST WITH CONTRAST TECHNIQUE: Multidetector CT imaging of the chest was performed during intravenous contrast administration. CONTRAST:  74mL OMNIPAQUE IOHEXOL 300 MG/ML  SOLN COMPARISON:  01/04/2011; 08/16/2013 FINDINGS: Observed mediastinal lymph nodes are not pathologically enlarged by size criteria. Proximal left anterior descending and left circumflex coronary artery atherosclerotic calcification noted. There is also calcification in the right coronary artery. There appears to be mucus in the trachea on images 9 through 12 of series 4. Mild airway thickening is observed bilaterally. Breathing motion artifact is present on today's exam. No lung mass is identified. There is degenerative right sternoclavicular arthropathy. Thoracic spondylosis noted. Calcification noted in the intervertebral disc at T9-10. IMPRESSION: 1. Airway thickening is present, suggesting bronchitis or reactive airways disease. There is also a small amount of mucus in the trachea. 2. A specific cause for hemoptysis is not identified. 3. Coronary artery  atherosclerosis. 4. Incidental findings include thoracic spondylosis and degenerative right sternoclavicular arthropathy. Electronically Signed   By: Sherryl Barters M.D.   On: 08/22/2013 15:39   Ct Maxillofacial W/cm  Result Date: 08/22/2013 CLINICAL DATA:  Lesion under the tongue.  Tobacco abuse. EXAM: CT MAXILLOFACIAL WITH CONTRAST TECHNIQUE: Multidetector CT imaging of the maxillofacial structures was performed with intravenous contrast. Multiplanar CT image reconstructions were also generated. A small metallic BB was placed on the right temple in order to reliably differentiate right from left. CONTRAST:  47mL OMNIPAQUE IOHEXOL 300 MG/ML  SOLN COMPARISON:  None. FINDINGS: There is no evidence of fracture. There is no evidence of inflammatory sinus disease. There are a few retention cysts along the anterior aspect of the right maxillary sinus. Nasal passages on the right are widely patent. There  may be a polyp, focal mucosal inflammation or cyst in the nasal passages on the left. No fluid in the middle ears or mastoids. Orbital contents are normal. Both parotid glands are normal. Both submandibular glands are normal. I do not identify any mucosal or submucosal lesion, with specific attention to the under surface of the tongue. No abnormal lymph nodes are seen. Patient has extensive dental and periodontal disease throughout. IMPRESSION: I cannot identify any mucosal or submucosal lesion, with specific attention to the region of the under surface of the tongue. No enlarged lymph nodes are present in the region. This study was ordered as a facial CT rather than a neck CT, and it does not evaluate the entire neck for lymphadenopathy. Advanced dental and periodontal disease. Electronically Signed   By: Nelson Chimes M.D.   On: 08/22/2013 13:36    Assessment & Plan:   Jimmie was seen today for anxiety.  Diagnoses and all orders for this visit:  COPD mixed type ([place]) -     PROAIR HFA 108 (90 Base) MCG/ACT  inhaler; Inhale 1-2 puffs into the lungs every 6 (six) hours as needed for wheezing or shortness of breath.  COPD exacerbation ([place])  Other orders -     DULoxetine (CYMBALTA) 30 MG capsule; Take 1 capsule (30 mg total) by mouth daily. For one week then two daily. Take with a full stomach at suppertime    I have discontinued Mr. Velazquez's cyclobenzaprine, hydrOXYzine, amoxicillin-clavulanate, and azithromycin. I am also having him start on DULoxetine. Additionally, I am having him maintain his aspirin EC, simvastatin, lisinopril, omeprazole, fluticasone, and PROAIR HFA.  Meds ordered this encounter  Medications  . DULoxetine (CYMBALTA) 30 MG capsule    Sig: Take 1 capsule (30 mg total) by mouth daily. For one week then two daily. Take with a full stomach at suppertime    Dispense:  60 capsule    Refill:  0  . PROAIR HFA 108 (90 Base) MCG/ACT inhaler    Sig: Inhale 1-2 puffs into the lungs every 6 (six) hours as needed for wheezing or shortness of breath.    Dispense:  2 Inhaler    Refill:  2     Follow-up: Return in about 1 month (around 05/01/2016).  [name], M.D.

## 2016-04-01 ENCOUNTER — Encounter: Payer: Self-pay | Admitting: Family Medicine

## 2016-04-01 ENCOUNTER — Telehealth: Payer: Self-pay | Admitting: Family Medicine

## 2016-04-01 NOTE — Telephone Encounter (Signed)
Please disregard message about pt no showing, I see that pt was actual seen by provider.

## 2016-04-06 ENCOUNTER — Telehealth: Payer: Self-pay | Admitting: Family Medicine

## 2016-04-06 NOTE — Telephone Encounter (Signed)
Please contact the patient that is not a known side effect of the medicine. I do think he'll be fine if he will give it just a little bit more time.

## 2016-04-06 NOTE — Telephone Encounter (Signed)
Pt has had an increase in urination both during the night and day since starting the cymbalta, has not had anything like this before

## 2016-04-06 NOTE — Telephone Encounter (Signed)
Pt aware of recommendations

## 2016-04-15 ENCOUNTER — Other Ambulatory Visit: Payer: Self-pay | Admitting: Family

## 2016-04-15 ENCOUNTER — Telehealth: Payer: Self-pay | Admitting: Family Medicine

## 2016-04-15 DIAGNOSIS — E7800 Pure hypercholesterolemia, unspecified: Secondary | ICD-10-CM

## 2016-04-15 DIAGNOSIS — I25119 Atherosclerotic heart disease of native coronary artery with unspecified angina pectoris: Secondary | ICD-10-CM

## 2016-04-15 NOTE — Telephone Encounter (Signed)
Gave call to triage °

## 2016-04-22 ENCOUNTER — Encounter: Payer: Self-pay | Admitting: Family Medicine

## 2016-04-22 ENCOUNTER — Ambulatory Visit (INDEPENDENT_AMBULATORY_CARE_PROVIDER_SITE_OTHER): Payer: Medicare Other | Admitting: Family Medicine

## 2016-04-22 VITALS — BP 138/86 | HR 89 | Temp 98.5°F | Ht 70.0 in | Wt 224.0 lb

## 2016-04-22 DIAGNOSIS — G4489 Other headache syndrome: Secondary | ICD-10-CM | POA: Diagnosis not present

## 2016-04-22 DIAGNOSIS — G809 Cerebral palsy, unspecified: Secondary | ICD-10-CM

## 2016-04-22 DIAGNOSIS — I10 Essential (primary) hypertension: Secondary | ICD-10-CM | POA: Diagnosis not present

## 2016-04-22 MED ORDER — BLOOD PRESSURE KIT: PACK | 0 refills | Status: DC

## 2016-04-22 MED ORDER — AMLODIPINE BESYLATE 5 MG PO TABS: 5.0000 mg | ORAL_TABLET | Freq: Every day | ORAL | 5 refills | Status: DC

## 2016-04-22 NOTE — Patient Instructions (Signed)
Be sure to take cymbalta two capsules every night

## 2016-04-22 NOTE — Progress Notes (Signed)
Subjective:  Patient ID: [name], male    DOB: 05-04-1955  Age: 61 y.o. MRN: [id number]  CC: Numbness (pt here today c/o tingling/burning in the top of his head and headaches for the past few weeks.)   HPI [name] presents for BP running high. Wants to check at home. Has aide who will check it if he has a monitor. Feels like [name] sticking in his head. Started before he started the cymbalta.   History [name] has a past medical history of Cerebral palsy ([name]); Coronary artery disease; Depression; GERD (gastroesophageal reflux disease); Hypertension; Scoliosis; and Seizures ([name]).   He has a past surgical history that includes Coronary angioplasty with stent.   His family history includes Alcohol abuse in his brother; CAD in his brother; CAD (age of onset: 54) in his father; Diabetes in his mother; Heart disease in his father.He reports that he has been smoking Cigarettes.  He has been smoking about 1.00 pack per day. He has never used smokeless tobacco. He reports that he drinks alcohol. He reports that he does not use drugs.    ROS Review of Systems  Constitutional: Negative for chills, diaphoresis and fever.  HENT: Negative for rhinorrhea and sore throat.   Respiratory: Negative for cough and shortness of breath.   Cardiovascular: Negative for chest pain.  Gastrointestinal: Negative for abdominal pain.  Musculoskeletal: Negative for arthralgias and myalgias.  Skin: Negative for rash.  Neurological: Negative for weakness and headaches.    Objective:  BP (!) 149/90   Pulse 89   Temp 98.5 F (36.9 C) (Oral)   Ht '5\' 10"'  (1.778 m)   Wt 224 lb (101.6 kg)   BMI 32.14 kg/m   BP Readings from Last 3 Encounters:  04/22/16 (!) 149/90  03/31/16 130/84  01/02/16 118/79    Wt Readings from Last 3 Encounters:  04/22/16 224 lb (101.6 kg)  03/31/16 222 lb (100.7 kg)  01/02/16 215 lb (97.5 kg)     Physical Exam  Constitutional: He appears well-developed and  well-nourished.  HENT:  Head: Normocephalic and atraumatic.  Right Ear: Tympanic membrane and external ear normal. No decreased hearing is noted.  Left Ear: Tympanic membrane and external ear normal. No decreased hearing is noted.  Mouth/Throat: No oropharyngeal exudate or posterior oropharyngeal erythema.  Eyes: Pupils are equal, round, and reactive to light.  Neck: Normal range of motion. Neck supple.  Cardiovascular: Normal rate and regular rhythm.   No murmur heard. Pulmonary/Chest: Breath sounds normal. No respiratory distress.  Abdominal: Soft. Bowel sounds are normal. He exhibits no mass. There is no tenderness.  Vitals reviewed.     Assessment & Plan:   [name] was seen today for numbness.  Diagnoses and all orders for this visit:  Other headache syndrome  Cerebral palsy, unspecified type ([place])  Essential hypertension  Other orders -     Blood Pressure KIT; Check blood pressure regularly -     amLODipine (NORVASC) 5 MG tablet; Take 1 tablet (5 mg total) by mouth daily. For blood pressure      I am having Mr. Twiggs start on Blood Pressure and amLODipine. I am also having him maintain his aspirin EC, lisinopril, omeprazole, fluticasone, DULoxetine, PROAIR HFA, and simvastatin.  Meds ordered this encounter  Medications  . Blood Pressure KIT    Sig: Check blood pressure regularly    Dispense:  1 each    Refill:  0  . amLODipine (NORVASC) 5 MG tablet  Sig: Take 1 tablet (5 mg total) by mouth daily. For blood pressure    Dispense:  30 tablet    Refill:  5     Follow-up: Return in about 3 months (around 07/21/2016), or if symptoms worsen or fail to improve, for hypertension.  [name], M.D.

## 2016-05-01 ENCOUNTER — Encounter: Payer: Self-pay | Admitting: Family Medicine

## 2016-05-01 ENCOUNTER — Ambulatory Visit (INDEPENDENT_AMBULATORY_CARE_PROVIDER_SITE_OTHER): Payer: Medicare Other | Admitting: Family Medicine

## 2016-05-01 VITALS — BP 112/68 | HR 93 | Temp 97.1°F | Ht 70.0 in | Wt 223.0 lb

## 2016-05-01 DIAGNOSIS — Z23 Encounter for immunization: Secondary | ICD-10-CM | POA: Diagnosis not present

## 2016-05-01 DIAGNOSIS — I10 Essential (primary) hypertension: Secondary | ICD-10-CM

## 2016-05-01 DIAGNOSIS — F411 Generalized anxiety disorder: Secondary | ICD-10-CM

## 2016-05-01 NOTE — Progress Notes (Signed)
Subjective:  Patient ID: [name], male    DOB: Mar 09, 1956  Age: 61 y.o. MRN: [id number]  CC: COPD (pt is here today for a one month follow up on his meds, no complaints)   HPI [name] presents for recheck of anxiety. Feeling much better with cymbalta. GAD 7 : Generalized Anxiety Score 05/01/2016 03/31/2016  Nervous, Anxious, on Edge 1 3  Control/stop worrying 1 3  Worry too much - different things 1 3  Trouble relaxing 0 2  Restless 0 2  Easily annoyed or irritable 0 0  Afraid - awful might happen 0 0  Total GAD 7 Score 3 13  Anxiety Difficulty Not difficult at all Not difficult at all   Patient's breathing is also back to normal. No wheezing. Has not required [employer] for several weeks   History [name] has a past medical history of Cerebral palsy ([place]); Coronary artery disease; Depression; GERD (gastroesophageal reflux disease); Hypertension; Scoliosis; and Seizures ([name]).   He has a past surgical history that includes Coronary angioplasty with stent.   His family history includes Alcohol abuse in his brother; CAD in his brother; CAD (age of onset: 41) in his father; Diabetes in his mother; Heart disease in his father.He reports that he has been smoking Cigarettes.  He has been smoking about 1.00 pack per day. He has never used smokeless tobacco. He reports that he drinks alcohol. He reports that he does not use drugs.    ROS Review of Systems  Constitutional: Negative for chills, diaphoresis and fever.  HENT: Negative for rhinorrhea and sore throat.   Respiratory: Negative for cough and shortness of breath.   Cardiovascular: Negative for chest pain.  Gastrointestinal: Negative for abdominal pain.  Musculoskeletal: Negative for arthralgias and myalgias.  Skin: Negative for rash.  Neurological: Negative for weakness and headaches.    Objective:  BP 112/68   Pulse 93   Temp 97.1 F (36.2 C) (Oral)   Ht '5\' 10"'  (1.778 m)   Wt 223 lb (101.2 kg)   BMI 32.00  kg/m   BP Readings from Last 3 Encounters:  05/01/16 112/68  04/22/16 138/86  03/31/16 130/84    Wt Readings from Last 3 Encounters:  05/01/16 223 lb (101.2 kg)  04/22/16 224 lb (101.6 kg)  03/31/16 222 lb (100.7 kg)     Physical Exam  Constitutional: He is oriented to person, place, and time. He appears well-developed and well-nourished. No distress.  HENT:  Head: Normocephalic and atraumatic.  Right Ear: External ear normal.  Left Ear: External ear normal.  Nose: Nose normal.  Mouth/Throat: Oropharynx is clear and moist.  Eyes: Conjunctivae and EOM are normal. Pupils are equal, round, and reactive to light.  Neck: Normal range of motion. Neck supple. No thyromegaly present.  Cardiovascular: Normal rate, regular rhythm and normal heart sounds.   No murmur heard. Pulmonary/Chest: Effort normal and breath sounds normal. No respiratory distress. He has no wheezes. He has no rales.  Abdominal: Soft. Bowel sounds are normal. He exhibits no distension. There is no tenderness.  Lymphadenopathy:    He has no cervical adenopathy.  Neurological: He is alert and oriented to person, place, and time. He has normal reflexes.  Skin: Skin is warm and dry.  Psychiatric: He has a normal mood and affect. His behavior is normal. Judgment and thought content normal.       Assessment & Plan:   [name] was seen today for copd.  Diagnoses and all  orders for this visit:  Essential hypertension  Encounter for immunization -     Flu Vaccine QUAD 36+ mos IM  GAD (generalized anxiety disorder)      I am having [name] maintain his aspirin EC, lisinopril, omeprazole, fluticasone, DULoxetine, PROAIR HFA, simvastatin, Blood Pressure, and amLODipine.  Allergies as of 05/01/2016   No Known Allergies     Medication List       Accurate as of 05/01/16 10:10 PM. Always use your most recent med list.          amLODipine 5 MG tablet Commonly known as:  NORVASC Take 1 tablet (5 mg  total) by mouth daily. For blood pressure   aspirin EC 81 MG tablet Take 1 tablet (81 mg total) by mouth daily.   Blood Pressure Kit Check blood pressure regularly   DULoxetine 30 MG capsule Commonly known as:  CYMBALTA Take 1 capsule (30 mg total) by mouth daily. For one week then two daily. Take with a full stomach at suppertime   fluticasone 50 MCG/ACT nasal spray Commonly known as:  FLONASE Place 2 sprays into both nostrils daily.   lisinopril 20 MG tablet Commonly known as:  PRINIVIL,ZESTRIL Take 1 tablet (20 mg total) by mouth daily.   omeprazole 20 MG capsule Commonly known as:  PRILOSEC TAKE (1) CAPSULE DAILY   PROAIR HFA 108 (90 Base) MCG/ACT inhaler Generic drug:  albuterol Inhale 1-2 puffs into the lungs every 6 (six) hours as needed for wheezing or shortness of breath.   simvastatin 20 MG tablet Commonly known as:  ZOCOR TAKE ONE TABLET AT BEDTIME        Follow-up: Return in about 6 months (around 10/29/2016).  [name], M.D.

## 2016-05-02 ENCOUNTER — Other Ambulatory Visit: Payer: Self-pay | Admitting: Family Medicine

## 2016-05-16 ENCOUNTER — Other Ambulatory Visit: Payer: Self-pay | Admitting: Pediatrics

## 2016-05-16 MED ORDER — AMLODIPINE BESYLATE 5 MG PO TABS: 5.0000 mg | ORAL_TABLET | Freq: Every day | ORAL | 5 refills | Status: DC

## 2016-05-18 ENCOUNTER — Telehealth: Payer: Self-pay | Admitting: Family Medicine

## 2016-05-18 ENCOUNTER — Other Ambulatory Visit: Payer: Self-pay | Admitting: *Deleted

## 2016-05-18 DIAGNOSIS — E785 Hyperlipidemia, unspecified: Secondary | ICD-10-CM | POA: Diagnosis not present

## 2016-05-18 DIAGNOSIS — I10 Essential (primary) hypertension: Secondary | ICD-10-CM | POA: Diagnosis not present

## 2016-05-18 DIAGNOSIS — M79603 Pain in arm, unspecified: Secondary | ICD-10-CM | POA: Diagnosis not present

## 2016-05-18 DIAGNOSIS — G5601 Carpal tunnel syndrome, right upper limb: Secondary | ICD-10-CM | POA: Diagnosis not present

## 2016-05-18 DIAGNOSIS — G5610 Other lesions of median nerve, unspecified upper limb: Secondary | ICD-10-CM | POA: Diagnosis not present

## 2016-05-18 DIAGNOSIS — M542 Cervicalgia: Secondary | ICD-10-CM | POA: Diagnosis not present

## 2016-05-18 DIAGNOSIS — G809 Cerebral palsy, unspecified: Secondary | ICD-10-CM | POA: Diagnosis not present

## 2016-05-18 MED ORDER — AMLODIPINE BESYLATE 5 MG PO TABS: 5.0000 mg | ORAL_TABLET | Freq: Every day | ORAL | 1 refills | Status: DC

## 2016-05-18 NOTE — Telephone Encounter (Signed)
90 day supply sent to pharmacy

## 2016-05-22 ENCOUNTER — Encounter: Payer: Self-pay | Admitting: Orthopedic Surgery

## 2016-05-22 ENCOUNTER — Ambulatory Visit (INDEPENDENT_AMBULATORY_CARE_PROVIDER_SITE_OTHER): Payer: Medicare Other | Admitting: Orthopedic Surgery

## 2016-05-22 VITALS — BP 122/79 | HR 81 | Wt 222.0 lb

## 2016-05-22 DIAGNOSIS — G5601 Carpal tunnel syndrome, right upper limb: Secondary | ICD-10-CM

## 2016-05-22 NOTE — Patient Instructions (Addendum)
RIGHT CARPAL TUNNEL RELEASE SURGERY 06/04/16  Carpal Tunnel Syndrome Carpal tunnel syndrome is a condition that causes pain in your hand and arm. The carpal tunnel is a narrow area located on the palm side of your wrist. Repeated wrist motion or certain diseases may cause swelling within the tunnel. This swelling pinches the main nerve in the wrist (median nerve). What are the causes? This condition may be caused by:  Repeated wrist motions.  Wrist injuries.  Arthritis.  A cyst or tumor in the carpal tunnel.  Fluid buildup during pregnancy. Sometimes the cause of this condition is not known. What increases the risk? This condition is more likely to develop in:  People who have jobs that cause them to repeatedly move their wrists in the same motion, such as Art gallery manager.  Women.  People with certain conditions, such as:  Diabetes.  Obesity.  An underactive thyroid (hypothyroidism).  Kidney failure. What are the signs or symptoms? Symptoms of this condition include:  A tingling feeling in your fingers, especially in your thumb, index, and middle fingers.  Tingling or numbness in your hand.  An aching feeling in your entire arm, especially when your wrist and elbow are bent for long periods of time.  Wrist pain that goes up your arm to your shoulder.  Pain that goes down into your palm or fingers.  A weak feeling in your hands. You may have trouble grabbing and holding items. Your symptoms may feel worse during the night. How is this diagnosed? This condition is diagnosed with a medical history and physical exam. You may also have tests, including:  An electromyogram (EMG). This test measures electrical signals sent by your nerves into the muscles.  X-rays. How is this treated? Treatment for this condition includes:  Lifestyle changes. It is important to stop doing or modify the activity that caused your condition.  Physical or occupational  therapy.  Medicines for pain and inflammation. This may include medicine that is injected into your wrist.  A wrist splint.  Surgery. Follow these instructions at home: If you have a splint:  Wear it as told by your health care provider. Remove it only as told by your health care provider.  Loosen the splint if your fingers become numb and tingle, or if they turn cold and blue.  Keep the splint clean and dry. General instructions  Take over-the-counter and prescription medicines only as told by your health care provider.  Rest your wrist from any activity that may be causing your pain. If your condition is work related, talk to your employer about changes that can be made, such as getting a wrist pad to use while typing.  If directed, apply ice to the painful area:  Put ice in a plastic bag.  Place a towel between your skin and the bag.  Leave the ice on for 20 minutes, 2-3 times per day.  Keep all follow-up visits as told by your health care provider. This is important.  Do any exercises as told by your health care provider, physical therapist, or occupational therapist. Contact a health care provider if:  You have new symptoms.  Your pain is not controlled with medicines.  Your symptoms get worse. This information is not intended to replace advice given to you by your health care provider. Make sure you discuss any questions you have with your health care provider. Document Released: 04/03/2000 Document Revised: 08/15/2015 Document Reviewed: 08/22/2014 Elsevier Interactive Patient Education  2017 [employer].  Carpal Tunnel Release Carpal tunnel release is a surgical procedure to relieve numbness and pain in your hand that are caused by carpal tunnel syndrome. Your carpal tunnel is a narrow, hollow space in your wrist. It passes between your wrist bones and a band of connective tissue (transverse carpal ligament). The nerve that supplies most of your hand (median  nerve) passes through this space, and so do the connections between your fingers and the muscles of your arm (tendons). Carpal tunnel syndrome makes this space swell and become narrow, and this causes pain and numbness. In carpal tunnel release surgery, a surgeon cuts through the transverse carpal ligament to make more room in the carpal tunnel space. You may have this surgery if other types of treatment have not worked. Tell a health care provider about:  Any allergies you have.  All medicines you are taking, including vitamins, herbs, eye drops, creams, and over-the-counter medicines.  Any problems you or family members have had with anesthetic medicines.  Any blood disorders you have.  Any surgeries you have had.  Any medical conditions you have. What are the risks? Generally, this is a safe procedure. However, problems may occur, including:  Bleeding.  Infection.  Injury to the median nerve.  Need for additional surgery. What happens before the procedure?  Ask your health care provider about:  Changing or stopping your regular medicines. This is especially important if you are taking diabetes medicines or blood thinners.  Taking medicines such as aspirin and ibuprofen. These medicines can thin your blood. Do not take these medicines before your procedure if your health care provider instructs you not to.  Do not eat or drink anything after midnight on the night before the procedure or as directed by your health care provider.  Plan to have someone take you home after the procedure. What happens during the procedure?  An IV tube may be inserted into a vein.  You will be given one of the following:  A medicine that numbs the wrist area (local anesthetic). You may also be given a medicine to make you relax (sedative).  A medicine that makes you go to sleep (general anesthetic).  Your arm, hand, and wrist will be cleaned with a germ-killing solution (antiseptic).  Your  surgeon will make a surgical cut (incision) over the palm side of your wrist. The surgeon will pull aside the skin of your wrist to expose the carpal tunnel space.  The surgeon will cut the transverse carpal ligament.  The edges of the incision will be closed with stitches (sutures) or staples.  A bandage (dressing) will be placed over your wrist and wrapped around your hand and wrist. What happens after the procedure?  You may spend some time in a recovery area.  Your blood pressure, heart rate, breathing rate, and blood oxygen level will be monitored often until the medicines you were given have worn off.  You will likely have some pain. You will be given pain medicine.  You may need to wear a splint or a wrist brace over your dressing. This information is not intended to replace advice given to you by your health care provider. Make sure you discuss any questions you have with your health care provider. Document Released: 06/27/2003 Document Revised: 09/12/2015 Document Reviewed: 11/22/2013 Elsevier Interactive Patient Education  2017 [employer].

## 2016-05-22 NOTE — Progress Notes (Signed)
Patient ID: [name], male   DOB: 1955-05-24, 61 y.o.   MRN: [id number]  Chief Complaint  Patient presents with  . Carpal Tunnel    Right wrist    HPI [name] is a 61 y.o. male.  Presents for evaluation of pain and paresthesias of his right upper extremity  Approximate 2 years ago the patient began to have gradual onset of pain and paresthesias weakness in the right upper extremity associated numbness and tingling and nocturnal symptoms  He was treated with bracing an injection and eventually a carpal tunnel test which showed carpal tunnel syndrome on the right read as mild to moderate bihilar and neurology  The patient presents now for evaluation for possible surgery  Review of Systems Review of Systems  Constitutional: Negative for fever.  Respiratory: Negative for shortness of breath.   Cardiovascular: Negative for chest pain.  Musculoskeletal: Positive for arthralgias.  Neurological: Positive for weakness and numbness.     Past Medical History:  Diagnosis Date  . Cerebral palsy ([place])   . Coronary artery disease   . Depression   . GERD (gastroesophageal reflux disease)   . Hypertension   . Scoliosis   . Seizures ([place])     Past Surgical History:  Procedure Laterality Date  . CORONARY ANGIOPLASTY WITH STENT PLACEMENT      Family History  Problem Relation Age of Onset  . CAD Father 11    CABG  . Heart disease Father   . CAD Brother   . Diabetes Mother   . Alcohol abuse Brother     Social History Social History  Substance Use Topics  . Smoking status: Current Every Day Smoker    Packs/day: 1.00    Types: Cigarettes  . Smokeless tobacco: Never Used  . Alcohol use Yes     Comment: daily pt states "just a few"    No Known Allergies  Current Outpatient Prescriptions  Medication Sig Dispense Refill  . amLODipine (NORVASC) 5 MG tablet Take 1 tablet (5 mg total) by mouth daily. For blood pressure 90 tablet 1  . aspirin EC 81 MG tablet Take 1  tablet (81 mg total) by mouth daily. 90 tablet 0  . DULoxetine (CYMBALTA) 30 MG capsule TAKE UP TO 2 TABLETS ONCE DAILY AS DIRECTED WITH A FULL STOMACH AT SUPPER TIME 60 capsule 2  . lisinopril (PRINIVIL,ZESTRIL) 20 MG tablet Take 1 tablet (20 mg total) by mouth daily. 90 tablet 3  . methylPREDNISolone (MEDROL DOSEPAK) 4 MG TBPK tablet Take by mouth.    [name] omeprazole (PRILOSEC) 20 MG capsule TAKE (1) CAPSULE DAILY 60 capsule 1  . simvastatin (ZOCOR) 20 MG tablet TAKE ONE TABLET AT BEDTIME 90 tablet 1  . traMADol (ULTRAM) 50 MG tablet Take 100 mg by mouth every 6 (six) hours as needed.    . Blood Pressure KIT Check blood pressure regularly 1 each 0  . fluticasone (FLONASE) 50 MCG/ACT nasal spray Place 2 sprays into both nostrils daily. 16 g 6  . PROAIR HFA 108 (90 Base) MCG/ACT inhaler Inhale 1-2 puffs into the lungs every 6 (six) hours as needed for wheezing or shortness of breath. 2 Inhaler 2   No current facility-administered medications for this visit.      Physical Exam Blood pressure 122/79, pulse 81, weight 222 lb (100.7 kg). Physical Exam The patient is well developed well nourished and well groomed.  Orientation to person place and time is normal  Mood is pleasant.  Ambulatory  status normal   Right Upper extremity examination reveals the following:  Inspection reveals no swelling. There is tenderness over the carpal tunnel. And volar forearm  Range of motion of the wrist and elbow are normal  Motor exam shows mild weakness with grip strength.  Wrist joint is stable  Provocative tests for carpal tunnel Phalen's test  Positive Carpal tunnel compression test positive   Pulses are normal in the radial and ulnar artery with a normal Allen's test.  Decreased sensation is noted in the median nerve distribution. Soft touch is normal.  Opposite extremityShows normal grip strength and range of motion  Data Reviewed Carpal tunnel testing shows positive right and left median  palmar responses are prolonged with reduced amplitude interpreted by [facility] neurology has bilateral mild to moderate median neuropathy Assessment    Right CTS  The patient is at bracing an injection and has had symptoms for 2 years and would like to proceed with carpal tunnel release    Plan   Right carpal tunnel release  This procedure has been fully reviewed with the patient and written informed consent has been obtained.

## 2016-05-25 ENCOUNTER — Other Ambulatory Visit: Payer: Self-pay | Admitting: Family Medicine

## 2016-05-26 NOTE — Patient Instructions (Signed)
[name]  05/26/2016     @PREFPERIOPPHARMACY @   Your procedure is scheduled on 06/04/2016.  Report to [name] at 12:30 P.M.  Call this number if you have problems the morning of surgery:  [phone]   Remember:  Do not eat food or drink liquids after midnight.  Take these medicines the morning of surgery with A SIP OF WATER Amlodipine, Cymbalta, Flonase, Lisinopril, Prilosec, ProAir inhaler and bring with you  To hospital, Tramadol if needed   Do not wear jewelry, make-up or nail polish.  Do not wear lotions, powders, or perfumes, or deoderant.  Do not shave 48 hours prior to surgery.  Men may shave face and neck.  Do not bring valuables to the hospital.  [facility] is not responsible for any belongings or valuables.  Contacts, dentures or bridgework may not be worn into surgery.  Leave your suitcase in the car.  After surgery it may be brought to your room.  For patients admitted to the hospital, discharge time will be determined by your treatment team.  Patients discharged the day of surgery will not be allowed to drive home.    Please read over the following fact sheets that you were given. Surgical Site Infection Prevention and Anesthesia Post-op Instructions     PATIENT INSTRUCTIONS POST-ANESTHESIA  IMMEDIATELY FOLLOWING SURGERY:  Do not drive or operate machinery for the first twenty four hours after surgery.  Do not make any important decisions for twenty four hours after surgery or while taking narcotic pain medications or sedatives.  If you develop intractable nausea and vomiting or a severe headache please notify your doctor immediately.  FOLLOW-UP:  Please make an appointment with your surgeon as instructed. You do not need to follow up with anesthesia unless specifically instructed to do so.  WOUND CARE INSTRUCTIONS (if applicable):  Keep a dry clean dressing on the anesthesia/puncture wound site if there is drainage.  Once the wound has quit draining  you may leave it open to air.  Generally you should leave the bandage intact for twenty four hours unless there is drainage.  If the epidural site drains for more than 36-48 hours please call the anesthesia department.  QUESTIONS?:  Please feel free to call your physician or the hospital operator if you have any questions, and they will be happy to assist you.      Carpal Tunnel Release Carpal tunnel release is a surgical procedure to relieve numbness and pain in your hand that are caused by carpal tunnel syndrome. Your carpal tunnel is a narrow, hollow space in your wrist. It passes between your wrist bones and a band of connective tissue (transverse carpal ligament). The nerve that supplies most of your hand (median nerve) passes through this space, and so do the connections between your fingers and the muscles of your arm (tendons). Carpal tunnel syndrome makes this space swell and become narrow, and this causes pain and numbness. In carpal tunnel release surgery, a surgeon cuts through the transverse carpal ligament to make more room in the carpal tunnel space. You may have this surgery if other types of treatment have not worked. Tell a health care provider about:  Any allergies you have.  All medicines you are taking, including vitamins, herbs, eye drops, creams, and over-the-counter medicines.  Any problems you or family members have had with anesthetic medicines.  Any blood disorders you have.  Any surgeries you have had.  Any medical conditions you have. What are  the risks? Generally, this is a safe procedure. However, problems may occur, including:  Bleeding.  Infection.  Injury to the median nerve.  Need for additional surgery. What happens before the procedure?  Ask your health care provider about:  Changing or stopping your regular medicines. This is especially important if you are taking diabetes medicines or blood thinners.  Taking medicines such as aspirin and  ibuprofen. These medicines can thin your blood. Do not take these medicines before your procedure if your health care provider instructs you not to.  Do not eat or drink anything after midnight on the night before the procedure or as directed by your health care provider.  Plan to have someone take you home after the procedure. What happens during the procedure?  An IV tube may be inserted into a vein.  You will be given one of the following:  A medicine that numbs the wrist area (local anesthetic). You may also be given a medicine to make you relax (sedative).  A medicine that makes you go to sleep (general anesthetic).  Your arm, hand, and wrist will be cleaned with a germ-killing solution (antiseptic).  Your surgeon will make a surgical cut (incision) over the palm side of your wrist. The surgeon will pull aside the skin of your wrist to expose the carpal tunnel space.  The surgeon will cut the transverse carpal ligament.  The edges of the incision will be closed with stitches (sutures) or staples.  A bandage (dressing) will be placed over your wrist and wrapped around your hand and wrist. What happens after the procedure?  You may spend some time in a recovery area.  Your blood pressure, heart rate, breathing rate, and blood oxygen level will be monitored often until the medicines you were given have worn off.  You will likely have some pain. You will be given pain medicine.  You may need to wear a splint or a wrist brace over your dressing. This information is not intended to replace advice given to you by your health care provider. Make sure you discuss any questions you have with your health care provider. Document Released: 06/27/2003 Document Revised: 09/12/2015 Document Reviewed: 11/22/2013 Elsevier Interactive Patient Education  2017 [employer].

## 2016-05-29 ENCOUNTER — Encounter (HOSPITAL_COMMUNITY)
Admission: RE | Admit: 2016-05-29 | Discharge: 2016-05-29 | Disposition: A | Discharge status: Home / Self Care | Payer: Medicare Other | Source: Ambulatory Visit | Attending: Orthopedic Surgery | Admitting: Orthopedic Surgery

## 2016-05-29 DIAGNOSIS — I517 Cardiomegaly: Secondary | ICD-10-CM | POA: Diagnosis not present

## 2016-05-29 DIAGNOSIS — R9431 Abnormal electrocardiogram [ECG] [EKG]: Secondary | ICD-10-CM | POA: Insufficient documentation

## 2016-05-29 DIAGNOSIS — Z01818 Encounter for other preprocedural examination: Secondary | ICD-10-CM | POA: Diagnosis not present

## 2016-05-30 NOTE — Telephone Encounter (Signed)
All meds were filled for 90 day supply with 1 refill in January.  Patient should have enough medication until next appt

## 2016-06-02 ENCOUNTER — Encounter (HOSPITAL_COMMUNITY): Payer: Self-pay

## 2016-06-02 ENCOUNTER — Encounter (HOSPITAL_COMMUNITY)
Admission: RE | Admit: 2016-06-02 | Discharge: 2016-06-02 | Disposition: A | Discharge status: Home / Self Care | Payer: Medicare Other | Source: Ambulatory Visit | Attending: Orthopedic Surgery | Admitting: Orthopedic Surgery

## 2016-06-02 DIAGNOSIS — Z01810 Encounter for preprocedural cardiovascular examination: Secondary | ICD-10-CM | POA: Diagnosis not present

## 2016-06-02 DIAGNOSIS — G5601 Carpal tunnel syndrome, right upper limb: Secondary | ICD-10-CM | POA: Diagnosis not present

## 2016-06-02 DIAGNOSIS — I517 Cardiomegaly: Secondary | ICD-10-CM | POA: Diagnosis not present

## 2016-06-02 DIAGNOSIS — Z01818 Encounter for other preprocedural examination: Secondary | ICD-10-CM | POA: Insufficient documentation

## 2016-06-02 DIAGNOSIS — R9431 Abnormal electrocardiogram [ECG] [EKG]: Secondary | ICD-10-CM | POA: Insufficient documentation

## 2016-06-02 LAB — BASIC METABOLIC PANEL
Anion gap: 9 (ref 5–15)
BUN: 12 mg/dL (ref 6–20)
CALCIUM: 9.3 mg/dL (ref 8.9–10.3)
CO2: 29 mmol/L (ref 22–32)
Chloride: 99 mmol/L — ABNORMAL LOW (ref 101–111)
Creatinine, Ser: 0.65 mg/dL (ref 0.61–1.24)
GFR calc Af Amer: >60 mL/min (ref >60)
Glucose, Bld: 92 mg/dL (ref 65–99)
Potassium: 3.3 mmol/L — ABNORMAL LOW (ref 3.5–5.1)
SODIUM: 137 mmol/L (ref 135–145)

## 2016-06-02 LAB — CBC WITH DIFFERENTIAL/PLATELET
BASOS PCT: 1 %
Basophils Absolute: 0.1 10*3/uL (ref 0.0–0.1)
EOS ABS: 1.2 10*3/uL — AB (ref 0.0–0.7)
Eosinophils Relative: 10 %
HCT: 43.7 % (ref 39.0–52.0)
Hemoglobin: 15.9 g/dL (ref 13.0–17.0)
Lymphocytes Relative: 28 %
Lymphs Abs: 3.4 10*3/uL (ref 0.7–4.0)
MCH: 36.1 pg — AB (ref 26.0–34.0)
MCHC: 36.4 g/dL — AB (ref 30.0–36.0)
MCV: 99.3 fL (ref 78.0–100.0)
MONO ABS: 0.8 10*3/uL (ref 0.1–1.0)
Monocytes Relative: 7 %
NEUTROS PCT: 54 %
Neutro Abs: 6.6 10*3/uL (ref 1.7–7.7)
PLATELETS: 224 10*3/uL (ref 150–400)
RBC: 4.4 MIL/uL (ref 4.22–5.81)
RDW: 13.2 % (ref 11.5–15.5)
WBC: 12.1 10*3/uL — ABNORMAL HIGH (ref 4.0–10.5)

## 2016-06-03 ENCOUNTER — Telehealth: Payer: Self-pay | Admitting: Orthopedic Surgery

## 2016-06-03 ENCOUNTER — Telehealth: Payer: Self-pay | Admitting: Family Medicine

## 2016-06-03 ENCOUNTER — Encounter (HOSPITAL_COMMUNITY): Payer: Self-pay

## 2016-06-03 NOTE — Telephone Encounter (Signed)
Surgery has been cancelled.

## 2016-06-03 NOTE — Telephone Encounter (Signed)
Please  write and I will sign. Thanks, WS 

## 2016-06-03 NOTE — Telephone Encounter (Signed)
Tracey from [facility] called to let Dr. Aline Brochure know that the patient had a seizure in his sleep on 05/22/16 per patient. Patient told him that he was taken off of his seizure medication back in April 2017. Dr. Patsey Berthold suggest no surgery until the patient return to PCP to be put back on his seizure medication.

## 2016-06-04 ENCOUNTER — Ambulatory Visit (HOSPITAL_COMMUNITY): Admission: RE | Admit: 2016-06-04 | Payer: Medicare Other | Source: Ambulatory Visit | Admitting: Orthopedic Surgery

## 2016-06-04 SURGERY — CARPAL TUNNEL RELEASE
Anesthesia: Choice | Laterality: Right

## 2016-06-05 ENCOUNTER — Ambulatory Visit: Payer: Medicare Other | Admitting: Family Medicine

## 2016-06-05 NOTE — Telephone Encounter (Signed)
Pt has an appt this morning with Dr. Livia Snellen.

## 2016-06-08 ENCOUNTER — Telehealth: Payer: Self-pay | Admitting: Family Medicine

## 2016-06-08 ENCOUNTER — Encounter: Payer: Self-pay | Admitting: Family Medicine

## 2016-06-09 ENCOUNTER — Emergency Department (HOSPITAL_COMMUNITY)
Admission: EM | Admit: 2016-06-09 | Discharge: 2016-06-10 | Disposition: A | Discharge status: Home / Self Care | Payer: Medicare Other | Attending: Emergency Medicine | Admitting: Emergency Medicine

## 2016-06-09 ENCOUNTER — Encounter (HOSPITAL_COMMUNITY): Payer: Self-pay | Admitting: *Deleted

## 2016-06-09 ENCOUNTER — Ambulatory Visit: Payer: Self-pay | Admitting: Orthopedic Surgery

## 2016-06-09 DIAGNOSIS — F10920 Alcohol use, unspecified with intoxication, uncomplicated: Secondary | ICD-10-CM | POA: Diagnosis not present

## 2016-06-09 DIAGNOSIS — Z7982 Long term (current) use of aspirin: Secondary | ICD-10-CM | POA: Insufficient documentation

## 2016-06-09 DIAGNOSIS — Z811 Family history of alcohol abuse and dependence: Secondary | ICD-10-CM | POA: Diagnosis not present

## 2016-06-09 DIAGNOSIS — F17210 Nicotine dependence, cigarettes, uncomplicated: Secondary | ICD-10-CM | POA: Insufficient documentation

## 2016-06-09 DIAGNOSIS — R45851 Suicidal ideations: Secondary | ICD-10-CM | POA: Diagnosis not present

## 2016-06-09 DIAGNOSIS — I252 Old myocardial infarction: Secondary | ICD-10-CM | POA: Insufficient documentation

## 2016-06-09 DIAGNOSIS — Z79899 Other long term (current) drug therapy: Secondary | ICD-10-CM | POA: Diagnosis not present

## 2016-06-09 DIAGNOSIS — F10129 Alcohol abuse with intoxication, unspecified: Secondary | ICD-10-CM | POA: Diagnosis not present

## 2016-06-09 DIAGNOSIS — I10 Essential (primary) hypertension: Secondary | ICD-10-CM | POA: Insufficient documentation

## 2016-06-09 DIAGNOSIS — I2510 Atherosclerotic heart disease of native coronary artery without angina pectoris: Secondary | ICD-10-CM | POA: Diagnosis not present

## 2016-06-09 DIAGNOSIS — F329 Major depressive disorder, single episode, unspecified: Secondary | ICD-10-CM | POA: Diagnosis present

## 2016-06-09 LAB — RAPID URINE DRUG SCREEN, HOSP PERFORMED
Amphetamines: NOT DETECTED
BARBITURATES: NOT DETECTED
Benzodiazepines: NOT DETECTED
Cocaine: NOT DETECTED
Opiates: NOT DETECTED
Tetrahydrocannabinol: NOT DETECTED

## 2016-06-09 LAB — CBC
HEMATOCRIT: 41.8 % (ref 39.0–52.0)
Hemoglobin: 15.4 g/dL (ref 13.0–17.0)
MCH: 36.2 pg — AB (ref 26.0–34.0)
MCHC: 36.8 g/dL — AB (ref 30.0–36.0)
MCV: 98.1 fL (ref 78.0–100.0)
Platelets: 222 10*3/uL (ref 150–400)
RBC: 4.26 MIL/uL (ref 4.22–5.81)
RDW: 13.2 % (ref 11.5–15.5)
WBC: 10.9 10*3/uL — ABNORMAL HIGH (ref 4.0–10.5)

## 2016-06-09 LAB — COMPREHENSIVE METABOLIC PANEL
ALBUMIN: 4 g/dL (ref 3.5–5.0)
ALK PHOS: 56 U/L (ref 38–126)
ALT: 22 U/L (ref 17–63)
ANION GAP: 12 (ref 5–15)
AST: 32 U/L (ref 15–41)
BILIRUBIN TOTAL: 0.9 mg/dL (ref 0.3–1.2)
BUN: 6 mg/dL (ref 6–20)
CALCIUM: 9.1 mg/dL (ref 8.9–10.3)
CO2: 30 mmol/L (ref 22–32)
CREATININE: 0.59 mg/dL — AB (ref 0.61–1.24)
Chloride: 97 mmol/L — ABNORMAL LOW (ref 101–111)
GFR calc Af Amer: >60 mL/min (ref >60)
GFR calc non Af Amer: >60 mL/min (ref >60)
GLUCOSE: 86 mg/dL (ref 65–99)
Potassium: 3.4 mmol/L — ABNORMAL LOW (ref 3.5–5.1)
Sodium: 139 mmol/L (ref 135–145)
Total Protein: 7.5 g/dL (ref 6.5–8.1)

## 2016-06-09 LAB — ACETAMINOPHEN LEVEL: Acetaminophen (Tylenol), Serum: <10 ug/mL — ABNORMAL LOW (ref 10–30)

## 2016-06-09 LAB — SALICYLATE LEVEL: Salicylate Lvl: <7 mg/dL (ref 2.8–30.0)

## 2016-06-09 LAB — ETHANOL: Alcohol, Ethyl (B): 190 mg/dL — ABNORMAL HIGH (ref <5)

## 2016-06-09 MED ORDER — ONDANSETRON HCL 4 MG PO TABS: 4.0000 mg | ORAL_TABLET | Freq: Three times a day (TID) | ORAL | Status: DC | PRN

## 2016-06-09 MED ORDER — ALUM & MAG HYDROXIDE-SIMETH 200-200-20 MG/5ML PO SUSP: 30.0000 mL | ORAL | Status: DC | PRN

## 2016-06-09 MED ORDER — ZOLPIDEM TARTRATE 5 MG PO TABS: 5.0000 mg | ORAL_TABLET | Freq: Every evening | ORAL | Status: DC | PRN

## 2016-06-09 MED ORDER — NICOTINE 21 MG/24HR TD PT24: 21.0000 mg | MEDICATED_PATCH | Freq: Every day | TRANSDERMAL | Status: DC | PRN

## 2016-06-09 MED ORDER — ACETAMINOPHEN 325 MG PO TABS: 650.0000 mg | ORAL_TABLET | ORAL | Status: DC | PRN

## 2016-06-09 MED ORDER — IBUPROFEN 400 MG PO TABS: 600.0000 mg | ORAL_TABLET | Freq: Three times a day (TID) | ORAL | Status: DC | PRN

## 2016-06-09 MED ORDER — POTASSIUM CHLORIDE CRYS ER 20 MEQ PO TBCR
40.0000 meq | EXTENDED_RELEASE_TABLET | Freq: Once | ORAL | Status: AC
  Administered 2016-06-09: 40 meq via ORAL
  Filled 2016-06-09: qty 2

## 2016-06-09 NOTE — ED Notes (Signed)
TTS at bedside. 

## 2016-06-09 NOTE — ED Triage Notes (Signed)
Pt comes in by EMS. Pt attempted to shoot himself in the head last night but wasn't brought to hospital. Family called EMS today when he went to his sisters house and made threats of wanting to harm himself. Pt alert and oriented.   Pt admits to drinking 18 pack of beer last night and 18 pack of beer today. He also did heroin and cocaine last night.

## 2016-06-09 NOTE — Progress Notes (Deleted)
  [name] is status post right carpal tunnel release   S:  No chief complaint on file.  Patient reports that they're doing well with mild discomfort.  The incision is clean dry and intact with no drainage The dressing was changed.  Patient to return for suture removal ~ POD 12-14

## 2016-06-09 NOTE — ED Notes (Signed)
Patients belongings placed in EMS locker room. Patients soiled pants were placed in trash bag then into patient belongings bag. RN aware.

## 2016-06-09 NOTE — ED Notes (Signed)
Pt gives permission for medical personnel to speak with his sister [name] [phone] and update her on his medical condition.

## 2016-06-09 NOTE — ED Provider Notes (Addendum)
[facility] DEPT Provider Note   CSN: [id number] Arrival date & time: 06/09/16  1444     History   Chief Complaint Chief Complaint  Patient presents with  . Depression    HPI [name] is a 61 y.o. male.  Patient presents for evaluation of thoughts of shooting himself in the head with a gun.  He states that he has distraught over being rejected by his ex-girlfriend.  This made him "go on a bender".  He states he drank copious amounts of beer, and uses heroin and cocaine in the last 24 hours.  He states people have been talking to him and saying "bad things about me".  He does not have any auditory or visual hallucinations.  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  He denies recent illnesses including nausea vomiting fever chills cough or chest pain.  There are no known modifying factors.  HPI  Past Medical History:  Diagnosis Date  . Cerebral palsy ([place])   . Coronary artery disease   . Depression   . GERD (gastroesophageal reflux disease)   . Hypertension   . Myocardial infarction 2008  . Scoliosis   . Seizures ([place])    last seizure 05/21/2016    Patient Active Problem List   Diagnosis Date Noted  . Carpal tunnel syndrome of right wrist 06/15/2016  . Suicidal ideation   . GAD (generalized anxiety disorder) 05/01/2016  . History of MI (myocardial infarction) 02/26/2015  . HYPERCHOLESTEROLEMIA 10/08/2009  . CEREBRAL PALSY 10/08/2009  . Essential hypertension 10/08/2009  . Coronary atherosclerosis 10/08/2009    Past Surgical History:  Procedure Laterality Date  . CORONARY ANGIOPLASTY WITH STENT PLACEMENT         Home Medications    Prior to Admission medications   Medication Sig Start Date End Date Taking? Authorizing Provider  amLODipine (NORVASC) 5 MG tablet Take 1 tablet (5 mg total) by mouth daily. For blood pressure 05/18/16   [name], MD  aspirin EC 81 MG tablet Take 1 tablet (81 mg total) by mouth daily.  11/15/13   Minus Breeding, MD  Aspirin-Salicylamide-Caffeine (BC FAST PAIN RELIEF) [phone] MG PACK Take 1 packet by mouth every 8 (eight) hours as needed (for pain.).    Historical Provider, MD  Blood Pressure KIT Check blood pressure regularly 04/22/16   [name], MD  DULoxetine (CYMBALTA) 30 MG capsule TAKE UP TO 2 TABLETS ONCE DAILY AS DIRECTED WITH A FULL STOMACH AT SUPPER TIME Patient taking differently: TAKE 2 CAPSULES (60 MG) BY MOUTH AT BEDTIME. 05/04/16   [name], MD  fluticasone [facility]) 50 MCG/ACT nasal spray Place 2 sprays into both nostrils daily. Patient taking differently: Place 2 sprays into both nostrils daily as needed for allergies.  09/03/15   Wardell Honour, MD  lisinopril (PRINIVIL,ZESTRIL) 20 MG tablet Take 1 tablet (20 mg total) by mouth daily. 05/16/15   Fransisca Kaufmann Dettinger, MD  omeprazole (PRILOSEC) 20 MG capsule TAKE (1) CAPSULE DAILY 08/30/15   [name], MD  PROAIR HFA 108 [phone] Base) MCG/ACT inhaler Inhale 1-2 puffs into the lungs every 6 (six) hours as needed for wheezing or shortness of breath. 03/31/16   [name], MD  simvastatin (ZOCOR) 20 MG tablet TAKE ONE TABLET AT BEDTIME Patient taking differently: TAKE ONE TABLET (20 MG) BY MOUTH AT BEDTIME. 04/15/16   [name], MD  traMADol (ULTRAM) 50 MG tablet Take 100 mg by mouth every 6 (six)  hours as needed (for pain.).     Historical Provider, MD    Family History Family History  Problem Relation Age of Onset  . CAD Father 33    CABG  . Heart disease Father   . CAD Brother   . Diabetes Mother   . Alcohol abuse Brother     Social History Social History  Substance Use Topics  . Smoking status: Current Every Day Smoker    Packs/day: 1.00    Types: Cigarettes  . Smokeless tobacco: Never Used  . Alcohol use Yes     Comment: daily pt states "just a few"     Allergies   Patient has no known allergies.   Review of Systems Review of Systems  All other systems reviewed and are  negative.    Physical Exam Updated Vital Signs BP 146/74 (BP Location: Left Arm)   Pulse 85   Temp 98.2 F (36.8 C) (Oral)   Resp 18   Ht 6' (1.829 m)   Wt 222 lb (100.7 kg)   SpO2 96%   BMI 30.11 kg/m   Physical Exam  Constitutional: He is oriented to person, place, and time. He appears well-developed.  Appears older than stated age.  HENT:  Head: Normocephalic and atraumatic.  Right Ear: External ear normal.  Left Ear: External ear normal.  Eyes: Conjunctivae and EOM are normal. Pupils are equal, round, and reactive to light.  Neck: Normal range of motion and phonation normal. Neck supple.  Cardiovascular: Normal rate, regular rhythm and normal heart sounds.   Pulmonary/Chest: Effort normal and breath sounds normal. He exhibits no bony tenderness.  Abdominal: Soft. There is no tenderness.  Musculoskeletal: Normal range of motion.  Neurological: He is alert and oriented to person, place, and time. No cranial nerve deficit or sensory deficit. He exhibits normal muscle tone. Coordination normal.  Dysarthric. No aphasia.  Skin: Skin is warm, dry and intact.  Psychiatric: His behavior is normal.  anxious  Nursing note and vitals reviewed.    ED Treatments / Results  Labs (all labs ordered are listed, but only abnormal results are displayed) Labs Reviewed  COMPREHENSIVE METABOLIC PANEL - Abnormal; Notable for the following:       Result Value   Potassium 3.4 (*)    Chloride 97 (*)    Creatinine, Ser 0.59 (*)    All other components within normal limits  ETHANOL - Abnormal; Notable for the following:    Alcohol, Ethyl (B) 190 (*)    All other components within normal limits  ACETAMINOPHEN LEVEL - Abnormal; Notable for the following:    Acetaminophen (Tylenol), Serum <10 (*)    All other components within normal limits  CBC - Abnormal; Notable for the following:    WBC 10.9 (*)    MCH 36.2 (*)    MCHC 36.8 (*)    All other components within normal limits    SALICYLATE LEVEL  RAPID URINE DRUG SCREEN, HOSP PERFORMED    EKG  EKG Interpretation None       Radiology No results found.  Procedures Procedures (including critical care time)  Medications Ordered in ED Medications  potassium chloride SA (K-DUR,KLOR-CON) CR tablet 40 mEq (40 mEq Oral Given 06/09/16 1726)     Initial Impression / Assessment and Plan / ED Course  I have reviewed the triage vital signs and the nursing notes.  Pertinent labs & imaging results that were available during my care of the patient were reviewed by me  and considered in my medical decision making (see chart for details).  Clinical Course as of Jun 16 1602  Tue Jun 09, 2016  1644 High Alcohol, Ethyl (B): (!) 190 [EW]  1644 Minimally low Potassium: (!) 3.4 [EW]  1645 Low likely related to dehydration as a secondary effect of alcohol abuse Chloride: (!) 97 [EW]  1645 At this time the patient is medically cleared for treatment by psychiatry.  [EW]  1646 Involuntary commitment paperwork filled out by me because the patient is at risk for killing self, by shooting himself in the head with a gun.  [EW]    Clinical Course User Index [EW] Daleen Bo, MD    Medications  potassium chloride SA (K-DUR,KLOR-CON) CR tablet 40 mEq (40 mEq Oral Given 06/09/16 1726)    No data found.   TTS Consultation   Final Clinical Impressions(s) / ED Diagnoses   Final diagnoses:  Alcoholic intoxication without complication ([place])  Suicidal ideation    Alcoholism with alcohol intoxication and suicidal ideation.  Patient is at high risk for suicide by shooting himself in the head with a gun apparently which was taken away from him last night by law enforcement.  It is unknown if he has other weapons.  Patient is unable to manage his affairs now and is at great risk for self-harm.   Nursing Notes Reviewed/ Care Coordinated Applicable Imaging Reviewed Interpretation of Laboratory Data incorporated into ED  treatment  Plan-as per TTS in conjunction with oncoming provider team  New Prescriptions Discharge Medication List as of 06/10/2016  1:54 PM       Daleen Bo, MD 06/09/16 [place], MD 06/16/16 [phone]
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
## 2016-06-09 NOTE — ED Notes (Signed)
Upon arrival, pt had feces and urine on himself. VS were checked on patient then patient was taken to shower room. Pt placed in purple scrubs with belongings placed in a locker. Pt has medications which will be counted with charge nurse. Pt expressed thoughts of wanting to harm himself and that he doesn't want to live any more.

## 2016-06-09 NOTE — ED Notes (Signed)
Pt eating his dinner meal tray.

## 2016-06-09 NOTE — ED Notes (Signed)
Pt speaking with TTS at this time.  

## 2016-06-10 DIAGNOSIS — Z7982 Long term (current) use of aspirin: Secondary | ICD-10-CM

## 2016-06-10 DIAGNOSIS — Z79899 Other long term (current) drug therapy: Secondary | ICD-10-CM

## 2016-06-10 DIAGNOSIS — R45851 Suicidal ideations: Secondary | ICD-10-CM

## 2016-06-10 DIAGNOSIS — F10920 Alcohol use, unspecified with intoxication, uncomplicated: Secondary | ICD-10-CM

## 2016-06-10 DIAGNOSIS — Z811 Family history of alcohol abuse and dependence: Secondary | ICD-10-CM

## 2016-06-10 DIAGNOSIS — F17210 Nicotine dependence, cigarettes, uncomplicated: Secondary | ICD-10-CM

## 2016-06-10 NOTE — Telephone Encounter (Signed)
Pt came into the office but thought his appt had been in the afternoon and not in the morning.

## 2016-06-10 NOTE — ED Provider Notes (Signed)
TTS has re-evaluated pt today: states pt can go home with outpt f/u. Will d/c stable.    Francine Graven, DO 06/10/16 1355

## 2016-06-10 NOTE — ED Notes (Signed)
Pt discharged home. Discharge instructions reviewed with pt sister. All belongings provided to patient at discharge.

## 2016-06-10 NOTE — Discharge Instructions (Signed)
Take your usual prescriptions as previously directed.  Call your regular medical doctor today to schedule a follow up appointment this week. Call the Psychiatrist today to schedule a follow up appointment within the next week.  Return to the Emergency Department immediately sooner if worsening.
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
## 2016-06-10 NOTE — ED Notes (Signed)
Rescinded IVC papers faxed to [facility] office, prior to discharge.

## 2016-06-10 NOTE — ED Notes (Signed)
Pt family called and inquired on pt status. Pt family aware pt is pending placement. Pt emergency contact wishing to be contacted with disposition. Primary RN aware.

## 2016-06-10 NOTE — ED Notes (Signed)
Meal given to pt.

## 2016-06-15 ENCOUNTER — Ambulatory Visit (INDEPENDENT_AMBULATORY_CARE_PROVIDER_SITE_OTHER): Payer: Medicare Other | Admitting: Family Medicine

## 2016-06-15 ENCOUNTER — Encounter: Payer: Self-pay | Admitting: Family Medicine

## 2016-06-15 VITALS — BP 111/66 | HR 84 | Temp 99.1°F | Ht 72.0 in | Wt 222.0 lb

## 2016-06-15 DIAGNOSIS — G40909 Epilepsy, unspecified, not intractable, without status epilepticus: Secondary | ICD-10-CM | POA: Diagnosis not present

## 2016-06-15 DIAGNOSIS — G5601 Carpal tunnel syndrome, right upper limb: Secondary | ICD-10-CM

## 2016-06-15 DIAGNOSIS — G809 Cerebral palsy, unspecified: Secondary | ICD-10-CM | POA: Diagnosis not present

## 2016-06-15 NOTE — Progress Notes (Signed)
Subjective:  Patient ID: [name], male    DOB: [birth year]  Age: 61 y.o. MRN: [id number]  CC: Pre-op Exam (pt here today to discuss Carpal Tunnel surgery. )   HPI [name] presents for Clearance for upcoming carpal tunnel surgery. He has had seizures in the past due to his history of cerebral palsy. He's been seizure-free for about 5 years. He has been off all seizure medicine for over 2 years he claims. Dr. Aline Brochure tells him that he needs some type of clearance before performing surgery with anesthesia. There is concern for his part that a seizure could occur during the surgery.  Patient states that his right hand turns white and goes numb and it's difficult to lift or even feed himself with that hand. Unfortunately due to his cerebral palsy his left arm is weak and the left hand has limited range of motion. He cannot flex beyond about 45. He cannot extend beyond neutral. The fingers do not work well together. Because of this he frequently has trouble with ADLs such as bathing and meal preparation. It's difficult to dress himself at times. He would like to see if home health agencies are available to assist  History [name] has a past medical history of Cerebral palsy ([place]); Coronary artery disease; Depression; GERD (gastroesophageal reflux disease); Hypertension; Myocardial infarction (2008); Scoliosis; and Seizures ([place]).   He has a past surgical history that includes Coronary angioplasty with stent.   His family history includes Alcohol abuse in his brother; CAD in his brother; CAD (age of onset: 86) in his father; Diabetes in his mother; Heart disease in his father.He reports that he has been smoking Cigarettes.  He has been smoking about 1.00 pack per day. He has never used smokeless tobacco. He reports that he drinks alcohol. He reports that he does not use drugs.    ROS Review of Systems  Constitutional: Negative for chills, diaphoresis and fever.  HENT: Negative for  rhinorrhea and sore throat.   Respiratory: Negative for cough and shortness of breath.   Cardiovascular: Negative for chest pain.  Gastrointestinal: Negative for abdominal pain.  Musculoskeletal:       See history of present illness  Skin: Negative for rash.  Neurological: Positive for weakness. Negative for headaches.    Objective:  BP 111/66   Pulse 84   Temp 99.1 F (37.3 C) (Oral)   Ht 6' (1.829 m)   Wt 222 lb (100.7 kg)   BMI 30.11 kg/m   BP Readings from Last 3 Encounters:  06/15/16 111/66  06/10/16 146/74  06/02/16 123/81    Wt Readings from Last 3 Encounters:  06/15/16 222 lb (100.7 kg)  06/09/16 222 lb (100.7 kg)  06/02/16 222 lb (100.7 kg)     Physical Exam  Constitutional: He appears well-developed and well-nourished.  HENT:  Head: Normocephalic and atraumatic.  Right Ear: Tympanic membrane and external ear normal. No decreased hearing is noted.  Left Ear: Tympanic membrane and external ear normal. No decreased hearing is noted.  Mouth/Throat: No oropharyngeal exudate or posterior oropharyngeal erythema.  Eyes: Pupils are equal, round, and reactive to light.  Neck: Normal range of motion. Neck supple.  Cardiovascular: Normal rate and regular rhythm.   No murmur heard. Pulmonary/Chest: Breath sounds normal. No respiratory distress.  Abdominal: Soft. Bowel sounds are normal. He exhibits no mass. There is no tenderness.  Musculoskeletal:  The right upper extremity has a positive Tinel and Phalen. The left upper extremity has  deformity from laxity of tendons. There is decreased range of motion for extension of the hand beyond neutral. The second and third fingers can grip somewhat but the remainder of the fingers cannot grip.  Vitals reviewed.   No results found.  Assessment & Plan:   [name] was seen today for pre-op exam.  Diagnoses and all orders for this visit:  Cerebral palsy, unspecified type [facility]) -     Home Health -     Face-to-face  encounter (required for Medicare/Medicaid patients)  Carpal tunnel syndrome of right wrist -     Home Health -     Face-to-face encounter (required for Medicare/Medicaid patients)  Seizure disorder ([place]) -     EEG adult; Future -     Home Health -     Face-to-face encounter (required for Medicare/Medicaid patients)    I am having [name] maintain his aspirin EC, lisinopril, omeprazole, fluticasone, PROAIR HFA, simvastatin, Blood Pressure, DULoxetine, amLODipine, traMADol, and Aspirin-Salicylamide-Caffeine.  Allergies as of 06/15/2016   No Known Allergies     Medication List       Accurate as of 06/15/16  2:40 PM. Always use your most recent med list.          amLODipine 5 MG tablet Commonly known as:  NORVASC Take 1 tablet (5 mg total) by mouth daily. For blood pressure   aspirin EC 81 MG tablet Take 1 tablet (81 mg total) by mouth daily.   BC FAST PAIN RELIEF 650-195-33.3 MG Pack Generic drug:  Aspirin-Salicylamide-Caffeine Take 1 packet by mouth every 8 (eight) hours as needed (for pain.).   Blood Pressure Kit Check blood pressure regularly   DULoxetine 30 MG capsule Commonly known as:  CYMBALTA TAKE UP TO 2 TABLETS ONCE DAILY AS DIRECTED WITH A FULL STOMACH AT SUPPER TIME   fluticasone 50 MCG/ACT nasal spray Commonly known as:  FLONASE Place 2 sprays into both nostrils daily.   lisinopril 20 MG tablet Commonly known as:  PRINIVIL,ZESTRIL Take 1 tablet (20 mg total) by mouth daily.   omeprazole 20 MG capsule Commonly known as:  PRILOSEC TAKE (1) CAPSULE DAILY   PROAIR HFA 108 (90 Base) MCG/ACT inhaler Generic drug:  albuterol Inhale 1-2 puffs into the lungs every 6 (six) hours as needed for wheezing or shortness of breath.   simvastatin 20 MG tablet Commonly known as:  ZOCOR TAKE ONE TABLET AT BEDTIME   traMADol 50 MG tablet Commonly known as:  ULTRAM Take 100 mg by mouth every 6 (six) hours as needed (for pain.).        Follow-up: Return if  symptoms worsen or fail to improve.  [name], M.D.

## 2016-06-18 ENCOUNTER — Other Ambulatory Visit: Payer: Self-pay | Admitting: Family Medicine

## 2016-06-18 DIAGNOSIS — I10 Essential (primary) hypertension: Secondary | ICD-10-CM

## 2016-06-22 ENCOUNTER — Telehealth: Payer: Self-pay | Admitting: Family Medicine

## 2016-06-22 NOTE — Telephone Encounter (Signed)
Pt needs letter of surgical clearance for upcoming carpal tunnel surgery. Please advise

## 2016-06-22 NOTE — Telephone Encounter (Signed)
Please  write and I will sign. Thanks, WS 

## 2016-06-23 ENCOUNTER — Telehealth: Payer: Self-pay | Admitting: Orthopedic Surgery

## 2016-06-23 NOTE — Telephone Encounter (Signed)
Patients sister calling regarding her brother's surgery. She received a call from [facility] stating Clearance is needed for the anesthesiologist because patient had a history of seizures.  Patient has been off the medication for seizures for about 7 years.   PCP is Dr. [name] Dates.  Please advise [phone]

## 2016-06-23 NOTE — Telephone Encounter (Signed)
Patient will have surgical clearance form sent to our office

## 2016-06-23 NOTE — Telephone Encounter (Signed)
I see there was a recent office visit addressing surgery but I did not see that he was cleared in the office note.  We do not have a standard surgical clearance form. We typically just request a letter of clearance if clearance is not stated in office note. We can provide a copy to anesthesiologist.   Thank you

## 2016-06-24 ENCOUNTER — Other Ambulatory Visit: Payer: Self-pay | Admitting: Family Medicine

## 2016-06-24 DIAGNOSIS — Z87898 Personal history of other specified conditions: Secondary | ICD-10-CM

## 2016-06-24 NOTE — Telephone Encounter (Signed)
What would you like the letter to say?

## 2016-06-24 NOTE — Telephone Encounter (Signed)
Please contact the patient He will need a current EEG. I ordered one.

## 2016-06-24 NOTE — Telephone Encounter (Signed)
Please advise of letter

## 2016-06-25 NOTE — Telephone Encounter (Signed)
Pt is scheduled for EEG  At Dr. Freddie Apley office on 07/10/16 ar 11:30 Instructions given: No caffeine after midnight                               Dry, clean, hair with no products in it                               Bring hat to wear home  Pt and his sister aware of appointment date/time

## 2016-06-26 ENCOUNTER — Ambulatory Visit (INDEPENDENT_AMBULATORY_CARE_PROVIDER_SITE_OTHER): Payer: Medicare Other | Admitting: Family Medicine

## 2016-06-26 ENCOUNTER — Encounter: Payer: Self-pay | Admitting: Family Medicine

## 2016-06-26 VITALS — BP 112/69 | HR 99 | Temp 98.3°F | Ht 72.0 in | Wt 224.0 lb

## 2016-06-26 DIAGNOSIS — J441 Chronic obstructive pulmonary disease with (acute) exacerbation: Secondary | ICD-10-CM | POA: Diagnosis not present

## 2016-06-26 DIAGNOSIS — J40 Bronchitis, not specified as acute or chronic: Secondary | ICD-10-CM | POA: Diagnosis not present

## 2016-06-26 DIAGNOSIS — J329 Chronic sinusitis, unspecified: Secondary | ICD-10-CM | POA: Diagnosis not present

## 2016-06-26 MED ORDER — FLUTICASONE PROPIONATE 50 MCG/ACT NA SUSP: 2.0000 | Freq: Every day | NASAL | 6 refills | Status: DC

## 2016-06-26 MED ORDER — AMOXICILLIN-POT CLAVULANATE 875-125 MG PO TABS: 1.0000 | ORAL_TABLET | Freq: Two times a day (BID) | ORAL | 0 refills | Status: DC

## 2016-06-26 NOTE — Progress Notes (Signed)
Subjective:  Patient ID: [name], male    DOB: [birth year]  Age: 61 y.o. MRN: [id number]  CC: Generalized Body Aches (pt here today c/o body aches and chills)   HPI [name] presents for Patient presents with upper respiratory congestion. Rhinorrhea that is clear. There is moderate sore throat. Patient reports coughing frequently as well. Yellow  sputum noted.He has chills & sweats. The patient denies being short of breath. Onset was 5 days ago. Gradually worsening. Tried OTCs without improvement.   History [name] has a past medical history of Cerebral palsy ([name]); Coronary artery disease; Depression; GERD (gastroesophageal reflux disease); Hypertension; Myocardial infarction (2008); Scoliosis; and Seizures ([name]).   He has a past surgical history that includes Coronary angioplasty with stent.   His family history includes Alcohol abuse in his brother; CAD in his brother; CAD (age of onset: 43) in his father; Diabetes in his mother; Heart disease in his father.He reports that he has been smoking Cigarettes.  He has been smoking about 1.00 pack per day. He has never used smokeless tobacco. He reports that he drinks alcohol. He reports that he does not use drugs.  Current Outpatient Prescriptions on File Prior to Visit  Medication Sig Dispense Refill  . amLODipine (NORVASC) 5 MG tablet Take 1 tablet (5 mg total) by mouth daily. For blood pressure 90 tablet 1  . aspirin EC 81 MG tablet Take 1 tablet (81 mg total) by mouth daily. 90 tablet 0  . Aspirin-Salicylamide-Caffeine (BC FAST PAIN RELIEF) 650-195-33.3 MG PACK Take 1 packet by mouth every 8 (eight) hours as needed (for pain.).    [name] Blood Pressure KIT Check blood pressure regularly 1 each 0  . DULoxetine (CYMBALTA) 30 MG capsule TAKE UP TO 2 TABLETS ONCE DAILY AS DIRECTED WITH A FULL STOMACH AT SUPPER TIME (Patient taking differently: TAKE 2 CAPSULES (60 MG) BY MOUTH AT BEDTIME.) 60 capsule 2  . lisinopril (PRINIVIL,ZESTRIL) 20  MG tablet Take 1 tablet (20 mg total) by mouth daily. 90 tablet 1  . omeprazole (PRILOSEC) 20 MG capsule TAKE (1) CAPSULE DAILY 60 capsule 1  . PROAIR HFA 108 (90 Base) MCG/ACT inhaler Inhale 1-2 puffs into the lungs every 6 (six) hours as needed for wheezing or shortness of breath. 2 Inhaler 2  . simvastatin (ZOCOR) 20 MG tablet TAKE ONE TABLET AT BEDTIME (Patient taking differently: TAKE ONE TABLET (20 MG) BY MOUTH AT BEDTIME.) 90 tablet 1  . traMADol (ULTRAM) 50 MG tablet Take 100 mg by mouth every 6 (six) hours as needed (for pain.).      No current facility-administered medications on file prior to visit.     ROS Review of Systems  Constitutional: Negative for activity change, appetite change, chills and fever.  HENT: Positive for congestion, postnasal drip and rhinorrhea. Negative for ear discharge, ear pain, hearing loss, nosebleeds, sinus pressure, sneezing and trouble swallowing.   Respiratory: Positive for cough. Negative for chest tightness and shortness of breath.   Cardiovascular: Negative for chest pain and palpitations.  Skin: Negative for rash.    Objective:  BP 112/69   Pulse 99   Temp 98.3 F (36.8 C) (Oral)   Ht 6' (1.829 m)   Wt 224 lb (101.6 kg)   BMI 30.38 kg/m   Physical Exam  Constitutional: He appears well-developed and well-nourished.  HENT:  Head: Normocephalic and atraumatic.  Right Ear: Tympanic membrane and external ear normal. No decreased hearing is noted.  Left Ear: Tympanic membrane  and external ear normal. No decreased hearing is noted.  Nose: Mucosal edema present. Right sinus exhibits no frontal sinus tenderness. Left sinus exhibits no frontal sinus tenderness.  Mouth/Throat: No oropharyngeal exudate or posterior oropharyngeal erythema.  Neck: No Brudzinski's sign noted.  Pulmonary/Chest: No respiratory distress. He has wheezes.  Lymphadenopathy:       Head (right side): No preauricular adenopathy present.       Head (left side): No  preauricular adenopathy present.       Right cervical: No superficial cervical adenopathy present.      Left cervical: No superficial cervical adenopathy present.    Assessment & Plan:   [name] was seen today for generalized body aches.  Diagnoses and all orders for this visit:  Sinobronchitis  COPD exacerbation ([place]) -     fluticasone (FLONASE) 50 MCG/ACT nasal spray; Place 2 sprays into both nostrils daily.  Other orders -     amoxicillin-clavulanate (AUGMENTIN) 875-125 MG tablet; Take 1 tablet by mouth 2 (two) times daily. Take all of this medication   I am having [name] start on amoxicillin-clavulanate. I am also having him maintain his aspirin EC, omeprazole, PROAIR HFA, simvastatin, Blood Pressure, DULoxetine, amLODipine, traMADol, Aspirin-Salicylamide-Caffeine, lisinopril, and fluticasone.  Meds ordered this encounter  Medications  . amoxicillin-clavulanate (AUGMENTIN) 875-125 MG tablet    Sig: Take 1 tablet by mouth 2 (two) times daily. Take all of this medication    Dispense:  20 tablet    Refill:  0  . fluticasone (FLONASE) 50 MCG/ACT nasal spray    Sig: Place 2 sprays into both nostrils daily.    Dispense:  16 g    Refill:  6     Follow-up: Return if symptoms worsen or fail to improve.  [name], M.D.

## 2016-07-02 ENCOUNTER — Telehealth: Payer: Self-pay | Admitting: Family Medicine

## 2016-07-03 ENCOUNTER — Encounter: Payer: Self-pay | Admitting: Family

## 2016-07-03 ENCOUNTER — Ambulatory Visit (INDEPENDENT_AMBULATORY_CARE_PROVIDER_SITE_OTHER): Payer: Medicare Other | Admitting: Family

## 2016-07-03 VITALS — BP 120/77 | HR 89 | Temp 98.1°F | Ht 72.0 in | Wt 226.0 lb

## 2016-07-03 DIAGNOSIS — F17200 Nicotine dependence, unspecified, uncomplicated: Secondary | ICD-10-CM | POA: Diagnosis not present

## 2016-07-03 DIAGNOSIS — J209 Acute bronchitis, unspecified: Secondary | ICD-10-CM | POA: Diagnosis not present

## 2016-07-03 DIAGNOSIS — J440 Chronic obstructive pulmonary disease with acute lower respiratory infection: Secondary | ICD-10-CM

## 2016-07-03 MED ORDER — FLUTICASONE FUROATE-VILANTEROL 100-25 MCG/INH IN AEPB: 1.0000 | INHALATION_SPRAY | Freq: Every day | RESPIRATORY_TRACT | 3 refills | Status: DC

## 2016-07-03 MED ORDER — PREDNISONE 10 MG (21) PO TBPK
ORAL_TABLET | ORAL | 0 refills | Status: DC
Start: 2016-07-03 — End: 2016-07-22

## 2016-07-03 NOTE — Progress Notes (Signed)
Subjective:    Patient ID: [name], male    DOB: Jul 26, 1955, 61 y.o.   MRN: [id number]  Pt presents to the office today for recurrent sinus complaints. PT was seen in the office on 06/26/16 and given augmentin and flonase. PT reports it "helped a little", but still can't sleep because he is coughing.  Cough  This is a recurrent problem. The current episode started 1 to 4 weeks ago. The problem has been gradually worsening. The problem occurs every few minutes. The cough is productive of sputum. Associated symptoms include chills, headaches, nasal congestion, postnasal drip, rhinorrhea, a sore throat and sweats. Pertinent negatives include no ear congestion, ear pain, fever, shortness of breath or wheezing. The symptoms are aggravated by lying down. He has tried rest and OTC cough suppressant (augmentin and flonase) for the symptoms. The treatment provided mild relief. There is no history of COPD.  Sinus Problem  Associated symptoms include chills, coughing, headaches and a sore throat. Pertinent negatives include no ear pain or shortness of breath.      Review of Systems  Constitutional: Positive for chills. Negative for fever.  HENT: Positive for postnasal drip, rhinorrhea and sore throat. Negative for ear pain.   Respiratory: Positive for cough. Negative for shortness of breath and wheezing.   Neurological: Positive for headaches.  All other systems reviewed and are negative.      Objective:   Physical Exam  Constitutional: He is oriented to person, place, and time. He appears well-developed and well-nourished. No distress.  HENT:  Head: Normocephalic.  Right Ear: External ear normal.  Left Ear: External ear normal.  Nose: Mucosal edema and rhinorrhea present.  Mouth/Throat: Posterior oropharyngeal erythema present.  Eyes: Pupils are equal, round, and reactive to light. Right eye exhibits no discharge. Left eye exhibits no discharge.  Neck: Normal range of motion. Neck  supple. No thyromegaly present.  Cardiovascular: Normal rate, regular rhythm, normal heart sounds and intact distal pulses.   No murmur heard. Pulmonary/Chest: Effort normal. No respiratory distress. He has wheezes.  Abdominal: Soft. Bowel sounds are normal. He exhibits no distension. There is no tenderness.  Musculoskeletal: Normal range of motion. He exhibits no edema or tenderness.  Neurological: He is alert and oriented to person, place, and time. He has normal reflexes. No cranial nerve deficit.  Skin: Skin is warm and dry. No rash noted. No erythema.  Psychiatric: He has a normal mood and affect. His behavior is normal. Judgment and thought content normal.  Vitals reviewed.     BP 120/77   Pulse 89   Temp 98.1 F (36.7 C) (Oral)   Ht 6' (1.829 m)   Wt 226 lb (102.5 kg)   BMI 30.65 kg/m      Assessment & Plan:  1. Acute bronchitis with COPD ([place]) - Take meds as prescribed - Use a cool mist humidifier  -Use saline nose sprays frequently -Saline irrigations of the nose can be very helpful if done frequently.  * 4X daily for 1 week*  * Use of a nettie pot can be helpful with this. Follow directions with this* -Force fluids -For any cough or congestion  Use plain Mucinex- regular strength or max strength is fine   * Children- consult with Pharmacist for dosing -For fever or aces or pains- take tylenol or ibuprofen appropriate for age and weight.  * for fevers greater than 101 orally you may alternate ibuprofen and tylenol every  3 hours. -Throat lozenges if help -  fluticasone furoate-vilanterol (BREO ELLIPTA) 100-25 MCG/INH AEPB; Inhale 1 puff into the lungs daily.  Dispense: 30 each; Refill: 3 - predniSONE (STERAPRED UNI-PAK 21 TAB) 10 MG (21) TBPK tablet; Use as directed  Dispense: 21 tablet; Refill: 0  2. Current smoker Smoking cessation discussed   Evelina Dun, FNP

## 2016-07-03 NOTE — Patient Instructions (Signed)

## 2016-07-04 ENCOUNTER — Other Ambulatory Visit: Payer: Self-pay | Admitting: Family Medicine

## 2016-07-10 DIAGNOSIS — G809 Cerebral palsy, unspecified: Secondary | ICD-10-CM | POA: Diagnosis not present

## 2016-07-10 DIAGNOSIS — M542 Cervicalgia: Secondary | ICD-10-CM | POA: Diagnosis not present

## 2016-07-10 DIAGNOSIS — M79603 Pain in arm, unspecified: Secondary | ICD-10-CM | POA: Diagnosis not present

## 2016-07-10 DIAGNOSIS — E785 Hyperlipidemia, unspecified: Secondary | ICD-10-CM | POA: Diagnosis not present

## 2016-07-10 DIAGNOSIS — R569 Unspecified convulsions: Secondary | ICD-10-CM | POA: Diagnosis not present

## 2016-07-10 DIAGNOSIS — G5601 Carpal tunnel syndrome, right upper limb: Secondary | ICD-10-CM | POA: Diagnosis not present

## 2016-07-10 DIAGNOSIS — G5610 Other lesions of median nerve, unspecified upper limb: Secondary | ICD-10-CM | POA: Diagnosis not present

## 2016-07-10 DIAGNOSIS — I10 Essential (primary) hypertension: Secondary | ICD-10-CM | POA: Diagnosis not present

## 2016-07-15 ENCOUNTER — Telehealth: Payer: Self-pay | Admitting: Family Medicine

## 2016-07-15 NOTE — Telephone Encounter (Signed)
Please contact the patient have not gotten a report back from the specialist in [place]. Let him know that in order to set up home health I have to see him for a face to face visit that discusses the reasons for home health referral. This is Medicare regulation.

## 2016-07-16 NOTE — Telephone Encounter (Signed)
Spoke to pt and his sister and scheduled appt for face to face 4/4 at 4:10.

## 2016-07-22 ENCOUNTER — Encounter: Payer: Self-pay | Admitting: Family Medicine

## 2016-07-22 ENCOUNTER — Ambulatory Visit (INDEPENDENT_AMBULATORY_CARE_PROVIDER_SITE_OTHER): Payer: Medicare Other | Admitting: Family Medicine

## 2016-07-22 VITALS — BP 120/75 | HR 91 | Temp 97.4°F | Ht 72.0 in | Wt 225.0 lb

## 2016-07-22 DIAGNOSIS — J449 Chronic obstructive pulmonary disease, unspecified: Secondary | ICD-10-CM | POA: Diagnosis not present

## 2016-07-22 DIAGNOSIS — I10 Essential (primary) hypertension: Secondary | ICD-10-CM | POA: Diagnosis not present

## 2016-07-22 DIAGNOSIS — I25119 Atherosclerotic heart disease of native coronary artery with unspecified angina pectoris: Secondary | ICD-10-CM | POA: Diagnosis not present

## 2016-07-22 DIAGNOSIS — F411 Generalized anxiety disorder: Secondary | ICD-10-CM

## 2016-07-22 DIAGNOSIS — G809 Cerebral palsy, unspecified: Secondary | ICD-10-CM | POA: Diagnosis not present

## 2016-07-22 NOTE — Telephone Encounter (Signed)
Patient had office visit on 07-03-16.

## 2016-07-22 NOTE — Progress Notes (Signed)
Subjective:  Patient ID: [name], male    DOB: April 22, 1955  Age: 61 y.o. MRN: [id number]  CC: face to face (pt here today for a face to face visit for home health)   HPI [name] presents for inability to provide his own medical care. [name] lives alone. [name] can not bathe himself. [name] cannot remember his medications to take them apropriately without supervision. His ambulation is poor due to weakness of the left leg caused by an old fracture. [name] walks slowly, with a limp.This is complicated by weakness related to his cerebral palsy and limited cogntion due to CP as well.   History [name] has a past medical history of Cerebral palsy ([place]); Coronary artery disease; Depression; GERD (gastroesophageal reflux disease); Hypertension; Myocardial infarction [facility]) (2008); Scoliosis; and Seizures ([place]).   [name] has a past surgical history that includes Coronary angioplasty with stent.   His family history includes Alcohol abuse in his brother; CAD in his brother; CAD (age of onset: 66) in his father; Diabetes in his mother; Heart disease in his father.[name] reports that [name] has been smoking Cigarettes.  [name] has been smoking about 1.00 pack per day. [name] has never used smokeless tobacco. [name] reports that [name] drinks alcohol. [name] reports that [name] does not use drugs.    ROS Review of Systems  Constitutional: Negative for chills, diaphoresis, fever and unexpected weight change.  HENT: Negative for congestion, hearing loss, rhinorrhea and sore throat.   Eyes: Negative for visual disturbance.  Respiratory: Negative for cough and shortness of breath.   Cardiovascular: Negative for chest pain.  Gastrointestinal: Negative for abdominal pain.  Genitourinary: Negative for dysuria and flank pain.  Musculoskeletal: Positive for gait problem and joint swelling. Negative for arthralgias.  Skin: Negative for rash.  Neurological: Negative for dizziness and headaches.  Psychiatric/Behavioral: Positive for confusion.  Negative for dysphoric mood and sleep disturbance.    Objective:  BP 120/75   Pulse 91   Temp 97.4 F (36.3 C) (Oral)   Ht 6' (1.829 m)   Wt 225 lb (102.1 kg)   BMI 30.52 kg/m   BP Readings from Last 3 Encounters:  07/22/16 120/75  07/03/16 120/77  06/26/16 112/69    Wt Readings from Last 3 Encounters:  07/22/16 225 lb (102.1 kg)  07/03/16 226 lb (102.5 kg)  06/26/16 224 lb (101.6 kg)     Physical Exam  Constitutional: [name] appears well-developed and well-nourished.  HENT:  Head: Normocephalic and atraumatic.  Right Ear: Tympanic membrane and external ear normal. No decreased hearing is noted.  Left Ear: Tympanic membrane and external ear normal. No decreased hearing is noted.  Mouth/Throat: No oropharyngeal exudate or posterior oropharyngeal erythema.  Eyes: Pupils are equal, round, and reactive to light.  Neck: Normal range of motion. Neck supple.  Cardiovascular: Normal rate and regular rhythm.   No murmur heard. Pulmonary/Chest: Breath sounds normal. No respiratory distress.  Abdominal: Soft. Bowel sounds are normal. [name] exhibits no mass. There is no tenderness.  Psychiatric: [name] has a normal mood and affect. His speech is delayed. [name] is slowed. Cognition and memory are impaired.  Vitals reviewed.     Assessment & Plan:   [name] was seen today for face to face.  Diagnoses and all orders for this visit:  Cerebral palsy, unspecified type ([place]) -     Face-to-face encounter (required for Medicare/Medicaid patients)  COPD mixed type [facility]) -     Face-to-face encounter (required for Medicare/Medicaid patients)  Essential  hypertension -     Face-to-face encounter (required for Medicare/Medicaid patients)  GAD (generalized anxiety disorder) -     Face-to-face encounter (required for Medicare/Medicaid patients)  Infantile cerebral palsy ([place]) -     Face-to-face encounter (required for Medicare/Medicaid patients)  Atherosclerosis of native coronary artery of  native heart with angina pectoris ([place]) -     Face-to-face encounter (required for Medicare/Medicaid patients)  Other orders -     amoxicillin-clavulanate (AUGMENTIN) 875-125 MG tablet; Take 1 tablet by mouth 2 (two) times daily. Take all of this medication       I have discontinued [name]'s predniSONE. I am also having him maintain his aspirin EC, PROAIR HFA, simvastatin, Blood Pressure, DULoxetine, amLODipine, traMADol, Aspirin-Salicylamide-Caffeine, lisinopril, fluticasone, fluticasone furoate-vilanterol, omeprazole, and amoxicillin-clavulanate.  Allergies as of 07/22/2016   No Known Allergies     Medication List       Accurate as of 07/22/16 11:59 PM. Always use your most recent med list.          amLODipine 5 MG tablet Commonly known as:  NORVASC Take 1 tablet (5 mg total) by mouth daily. For blood pressure   amoxicillin-clavulanate 875-125 MG tablet Commonly known as:  AUGMENTIN Take 1 tablet by mouth 2 (two) times daily. Take all of this medication   aspirin EC 81 MG tablet Take 1 tablet (81 mg total) by mouth daily.   BC FAST PAIN RELIEF 650-195-33.3 MG Pack Generic drug:  Aspirin-Salicylamide-Caffeine Take 1 packet by mouth every 8 (eight) hours as needed (for pain.).   Blood Pressure Kit Check blood pressure regularly   DULoxetine 30 MG capsule Commonly known as:  CYMBALTA TAKE UP TO 2 TABLETS ONCE DAILY AS DIRECTED WITH A FULL STOMACH AT SUPPER TIME   fluticasone 50 MCG/ACT nasal spray Commonly known as:  FLONASE Place 2 sprays into both nostrils daily.   fluticasone furoate-vilanterol 100-25 MCG/INH Aepb Commonly known as:  BREO ELLIPTA Inhale 1 puff into the lungs daily.   lisinopril 20 MG tablet Commonly known as:  PRINIVIL,ZESTRIL Take 1 tablet (20 mg total) by mouth daily.   omeprazole 20 MG capsule Commonly known as:  PRILOSEC TAKE (1) CAPSULE DAILY   PROAIR HFA 108 (90 Base) MCG/ACT inhaler Generic drug:  albuterol Inhale 1-2 puffs into  the lungs every 6 (six) hours as needed for wheezing or shortness of breath.   simvastatin 20 MG tablet Commonly known as:  ZOCOR TAKE ONE TABLET AT BEDTIME   traMADol 50 MG tablet Commonly known as:  ULTRAM Take 100 mg by mouth every 6 (six) hours as needed (for pain.).        Follow-up: Return in about 3 months (around 10/21/2016).  [name], M.D.

## 2016-07-24 ENCOUNTER — Telehealth: Payer: Self-pay | Admitting: Family Medicine

## 2016-07-24 ENCOUNTER — Telehealth: Payer: Self-pay | Admitting: Orthopedic Surgery

## 2016-07-24 NOTE — Telephone Encounter (Signed)
Informed patient that Dr. Arther Abbott ([place]) office will give him a call on Monday 4/9 to discuss the hand surgery.

## 2016-07-24 NOTE — Telephone Encounter (Signed)
Patient wants to get clearance for surgery on his hand.  Patient referred to Dr. Freddie Apley office for seizure clearance and had EEG and this was normal. Dr. Ruthe Mannan [facility]) office will contact patient on Monday 4/9 to discuss.

## 2016-07-24 NOTE — Telephone Encounter (Signed)
Patient called, inquiring about re-schedule of surgery, which was postponed due to other medical reasons.  Patient states he has seen his primary care, Dr Livia Snellen since then, and had also been referred for some other testing.  I relayed we are evidently awaiting medical clearance.  Please advise.  Patient's ph is [phone], or we may speak with  [facility] Sister [phone]  [phone]

## 2016-07-24 NOTE — Telephone Encounter (Signed)
Informed patient that Dr. Arther Abbott ([place]) office will give him a call on Monday 4/9 to discuss the hand surgery.

## 2016-07-28 ENCOUNTER — Telehealth: Payer: Self-pay | Admitting: Family Medicine

## 2016-07-28 MED ORDER — AMOXICILLIN-POT CLAVULANATE 875-125 MG PO TABS: 1.0000 | ORAL_TABLET | Freq: Two times a day (BID) | ORAL | 0 refills | Status: DC

## 2016-07-28 NOTE — Telephone Encounter (Signed)
I sent in the requested prescription 

## 2016-07-28 NOTE — Telephone Encounter (Signed)
What symptoms do you have? Set back on his uri. Can't cough the mess up  How long have you been sick? Three days  Have you been seen for this problem? yes  If your provider decides to give you a prescription, which pharmacy would you like for it to be sent to? [facility]   Patient informed that this information will be sent to the clinical staff for review and that they should receive a follow up call.

## 2016-07-28 NOTE — Telephone Encounter (Signed)
Pt states still not over URI.  Abx did not completely clear it up. Non productive cough Having hand surgery in next couple weeks Uses [facility] pharmacy

## 2016-07-28 NOTE — Telephone Encounter (Signed)
LMOVM that Rx was sent to pharmacy

## 2016-07-31 ENCOUNTER — Ambulatory Visit: Payer: Medicare Other | Admitting: Family Medicine

## 2016-08-03 ENCOUNTER — Telehealth: Payer: Self-pay

## 2016-08-03 NOTE — Telephone Encounter (Signed)
Please call pt about surgery schedule

## 2016-08-04 ENCOUNTER — Other Ambulatory Visit: Payer: Self-pay | Admitting: *Deleted

## 2016-08-04 NOTE — Telephone Encounter (Signed)
Awaiting surgical clearance from PCP. I have sent a message to his PCP to confirm it is okay to proceed. Will contact patient to schedule once clearance is received.

## 2016-08-04 NOTE — Telephone Encounter (Signed)
Yes

## 2016-08-04 NOTE — Telephone Encounter (Signed)
Pt called again this morning about his surgery. I explained to him that we were waiting on clearance from his PCP. I told him that we could not schedule any surgery without the clearance and that as soon as we get it he will be contacted.

## 2016-08-04 NOTE — Telephone Encounter (Signed)
Is [name] medically cleared for carpal tunnel surgery?

## 2016-08-06 ENCOUNTER — Encounter: Payer: Self-pay | Admitting: Family Medicine

## 2016-08-06 ENCOUNTER — Ambulatory Visit (INDEPENDENT_AMBULATORY_CARE_PROVIDER_SITE_OTHER): Payer: Medicare Other | Admitting: Family Medicine

## 2016-08-06 VITALS — BP 110/70 | HR 81 | Temp 97.5°F | Ht 72.0 in | Wt 221.5 lb

## 2016-08-06 DIAGNOSIS — I25119 Atherosclerotic heart disease of native coronary artery with unspecified angina pectoris: Secondary | ICD-10-CM | POA: Diagnosis not present

## 2016-08-06 DIAGNOSIS — I10 Essential (primary) hypertension: Secondary | ICD-10-CM | POA: Diagnosis not present

## 2016-08-06 DIAGNOSIS — E7800 Pure hypercholesterolemia, unspecified: Secondary | ICD-10-CM

## 2016-08-06 DIAGNOSIS — G5601 Carpal tunnel syndrome, right upper limb: Secondary | ICD-10-CM

## 2016-08-06 DIAGNOSIS — G809 Cerebral palsy, unspecified: Secondary | ICD-10-CM

## 2016-08-06 NOTE — Progress Notes (Signed)
Subjective:  Patient ID: [name], male    DOB: July 04, 1955  Age: 61 y.o. MRN: [id number]  CC: Hypertension (pt here today for routine follow up on his chronic medical conditions. He is scheduled to have surgery on his right wrist 08/13/16.)   HPI Terrie Grajales Brackeen presents for Patient has carpal tunnel syndrome. He also has no use of his left hand due to cerebral palsy. The carpal tunnel surgery is going to disable his right hand. He needs home health to help with that. He has not heard from them since I wrote the order. Of note is that the cough for which she was recently seen has now resolved. He is having some nocturia.  Patient in for follow-up of elevated cholesterol. Doing well without complaints on current medication. Denies side effects of statin including myalgia and arthralgia and nausea. Also in today for liver function testing. Currently no chest pain, shortness of breath or other cardiovascular related symptoms noted.   follow-up of hypertension. Patient has no history of headache chest pain or shortness of breath or recent cough. No change in his left sided weakness. No new symptoms referable to TIA. Medications reviewed. Patient denies side effects from his medication. States taking it regularly.    History Rahmel has a past medical history of Cerebral palsy ([place]); Coronary artery disease; Depression; GERD (gastroesophageal reflux disease); Hypertension; Myocardial infarction [facility]) (2008); Scoliosis; and Seizures ([place]).   He has a past surgical history that includes Coronary angioplasty with stent.   His family history includes Alcohol abuse in his brother; CAD in his brother; CAD (age of onset: 63) in his father; Diabetes in his mother; Heart disease in his father.He reports that he has been smoking Cigarettes.  He has been smoking about 1.00 pack per day. He has never used smokeless tobacco. He reports that he drinks alcohol. He reports that he does not use  drugs.    ROS Review of Systems  Constitutional: Negative for chills, diaphoresis, fever and unexpected weight change.  HENT: Negative for congestion, ear pain and sore throat.   Eyes: Negative for visual disturbance.  Respiratory: Negative for cough and shortness of breath.   Cardiovascular: Negative for chest pain.  Gastrointestinal: Negative for abdominal pain, constipation and diarrhea.  Genitourinary: Negative for dysuria and flank pain.  Musculoskeletal: Positive for arthralgias.  Skin: Negative for rash.  Neurological: Negative for dizziness and headaches.  Psychiatric/Behavioral: Negative for dysphoric mood and sleep disturbance.    Objective:  BP 110/70   Pulse 81   Temp 97.5 F (36.4 C) (Oral)   Ht 6' (1.829 m)   Wt 221 lb 8 oz (100.5 kg)   BMI 30.04 kg/m   BP Readings from Last 3 Encounters:  08/06/16 110/70  07/22/16 120/75  07/03/16 120/77    Wt Readings from Last 3 Encounters:  08/06/16 221 lb 8 oz (100.5 kg)  07/22/16 225 lb (102.1 kg)  07/03/16 226 lb (102.5 kg)     Physical Exam  Constitutional: He is oriented to person, place, and time. He appears well-developed and well-nourished. No distress.  HENT:  Head: Normocephalic and atraumatic.  Right Ear: External ear normal.  Left Ear: External ear normal.  Nose: Nose normal.  Mouth/Throat: Oropharynx is clear and moist.  Eyes: Conjunctivae and EOM are normal. Pupils are equal, round, and reactive to light.  Neck: Normal range of motion. Neck supple. No thyromegaly present.  Cardiovascular: Normal rate, regular rhythm and normal heart sounds.   No  murmur heard. Pulmonary/Chest: Effort normal and breath sounds normal. No respiratory distress. He has no wheezes. He has no rales.  Abdominal: Soft. Bowel sounds are normal. He exhibits no distension. There is no tenderness.  Lymphadenopathy:    He has no cervical adenopathy.  Neurological: He is alert and oriented to person, place, and time. He has  normal reflexes. Coordination (loss of use of the left hand and wrist) abnormal.  Skin: Skin is warm and dry.  Psychiatric: He has a normal mood and affect. His behavior is normal.      Assessment & Plan:   Macy was seen today for hypertension.  Diagnoses and all orders for this visit:  Infantile cerebral palsy ([place]) -     CBC with Differential/Platelet -     CMP14+EGFR  Essential hypertension -     CBC with Differential/Platelet -     CMP14+EGFR  Carpal tunnel syndrome of right wrist -     CBC with Differential/Platelet -     CMP14+EGFR  HYPERCHOLESTEROLEMIA -     CBC with Differential/Platelet -     CMP14+EGFR -     Lipid panel       I have discontinued Mr. Dimiceli's amoxicillin-clavulanate. I am also having him maintain his aspirin EC, PROAIR HFA, simvastatin, Blood Pressure, DULoxetine, amLODipine, traMADol, Aspirin-Salicylamide-Caffeine, lisinopril, fluticasone, fluticasone furoate-vilanterol, and omeprazole.  Allergies as of 08/06/2016   No Known Allergies     Medication List       Accurate as of 08/06/16 11:59 PM. Always use your most recent med list.          amLODipine 5 MG tablet Commonly known as:  NORVASC Take 1 tablet (5 mg total) by mouth daily. For blood pressure   aspirin EC 81 MG tablet Take 1 tablet (81 mg total) by mouth daily.   BC FAST PAIN RELIEF 650-195-33.3 MG Pack Generic drug:  Aspirin-Salicylamide-Caffeine Take 1 packet by mouth every 8 (eight) hours as needed (for pain.).   Blood Pressure Kit Check blood pressure regularly   DULoxetine 30 MG capsule Commonly known as:  CYMBALTA TAKE UP TO 2 TABLETS ONCE DAILY AS DIRECTED WITH A FULL STOMACH AT SUPPER TIME   fluticasone 50 MCG/ACT nasal spray Commonly known as:  FLONASE Place 2 sprays into both nostrils daily.   fluticasone furoate-vilanterol 100-25 MCG/INH Aepb Commonly known as:  BREO ELLIPTA Inhale 1 puff into the lungs daily.   lisinopril 20 MG tablet Commonly  known as:  PRINIVIL,ZESTRIL Take 1 tablet (20 mg total) by mouth daily.   omeprazole 20 MG capsule Commonly known as:  PRILOSEC TAKE (1) CAPSULE DAILY   PROAIR HFA 108 (90 Base) MCG/ACT inhaler Generic drug:  albuterol Inhale 1-2 puffs into the lungs every 6 (six) hours as needed for wheezing or shortness of breath.   simvastatin 20 MG tablet Commonly known as:  ZOCOR TAKE ONE TABLET AT BEDTIME   traMADol 50 MG tablet Commonly known as:  ULTRAM Take 100 mg by mouth every 6 (six) hours as needed (for pain.).      Home health order submitted. Previous face-to-face visit reviewed.  Follow-up: Return in about 6 months (around 02/05/2017).  [name], M.D.

## 2016-08-07 LAB — CMP14+EGFR
A/G RATIO: 1.7 (ref 1.2–2.2)
ALT: 11 IU/L (ref 0–44)
AST: 13 IU/L (ref 0–40)
Albumin: 4.2 g/dL (ref 3.5–5.5)
Alkaline Phosphatase: 67 IU/L (ref 39–117)
BUN/Creatinine Ratio: 17 (ref 9–20)
BUN: 10 mg/dL (ref 6–24)
Bilirubin Total: 0.6 mg/dL (ref 0.0–1.2)
CALCIUM: 9.2 mg/dL (ref 8.7–10.2)
CO2: 28 mmol/L (ref 18–29)
Chloride: 96 mmol/L (ref 96–106)
Creatinine, Ser: 0.58 mg/dL — ABNORMAL LOW (ref 0.76–1.27)
GFR, EST AFRICAN AMERICAN: 130 mL/min/{1.73_m2} (ref >59)
GFR, EST NON AFRICAN AMERICAN: 112 mL/min/{1.73_m2} (ref >59)
GLUCOSE: 88 mg/dL (ref 65–99)
Globulin, Total: 2.5 g/dL (ref 1.5–4.5)
POTASSIUM: 4.2 mmol/L (ref 3.5–5.2)
Sodium: 139 mmol/L (ref 134–144)
Total Protein: 6.7 g/dL (ref 6.0–8.5)

## 2016-08-07 LAB — CBC WITH DIFFERENTIAL/PLATELET
BASOS ABS: 0.1 10*3/uL (ref 0.0–0.2)
BASOS: 1 %
EOS (ABSOLUTE): 0.9 10*3/uL — AB (ref 0.0–0.4)
Eos: 9 %
Hematocrit: 44.3 % (ref 37.5–51.0)
Hemoglobin: 15.5 g/dL (ref 13.0–17.7)
IMMATURE GRANS (ABS): 0 10*3/uL (ref 0.0–0.1)
IMMATURE GRANULOCYTES: 0 %
LYMPHS: 34 %
Lymphocytes Absolute: 3.5 10*3/uL — ABNORMAL HIGH (ref 0.7–3.1)
MCH: 34.8 pg — ABNORMAL HIGH (ref 26.6–33.0)
MCHC: 35 g/dL (ref 31.5–35.7)
MCV: 100 fL — ABNORMAL HIGH (ref 79–97)
MONOS ABS: 0.6 10*3/uL (ref 0.1–0.9)
Monocytes: 6 %
NEUTROS PCT: 50 %
Neutrophils Absolute: 5.3 10*3/uL (ref 1.4–7.0)
PLATELETS: 219 10*3/uL (ref 150–379)
RBC: 4.45 x10E6/uL (ref 4.14–5.80)
RDW: 13.5 % (ref 12.3–15.4)
WBC: 10.5 10*3/uL (ref 3.4–10.8)

## 2016-08-07 LAB — LIPID PANEL
CHOL/HDL RATIO: 3.9 ratio (ref 0.0–5.0)
Cholesterol, Total: 113 mg/dL (ref 100–199)
HDL: 29 mg/dL — AB (ref >39)
LDL Calculated: 58 mg/dL (ref 0–99)
TRIGLYCERIDES: 128 mg/dL (ref 0–149)
VLDL CHOLESTEROL CAL: 26 mg/dL (ref 5–40)

## 2016-08-11 ENCOUNTER — Encounter (HOSPITAL_COMMUNITY)
Admission: RE | Admit: 2016-08-11 | Discharge: 2016-08-11 | Disposition: A | Discharge status: Home / Self Care | Payer: Medicare Other | Source: Ambulatory Visit | Attending: Orthopedic Surgery | Admitting: Orthopedic Surgery

## 2016-08-11 ENCOUNTER — Encounter (HOSPITAL_COMMUNITY): Payer: Self-pay

## 2016-08-11 DIAGNOSIS — I10 Essential (primary) hypertension: Secondary | ICD-10-CM | POA: Diagnosis not present

## 2016-08-11 DIAGNOSIS — Z7982 Long term (current) use of aspirin: Secondary | ICD-10-CM | POA: Diagnosis not present

## 2016-08-11 DIAGNOSIS — F17210 Nicotine dependence, cigarettes, uncomplicated: Secondary | ICD-10-CM | POA: Diagnosis not present

## 2016-08-11 DIAGNOSIS — G5601 Carpal tunnel syndrome, right upper limb: Secondary | ICD-10-CM | POA: Insufficient documentation

## 2016-08-11 DIAGNOSIS — F329 Major depressive disorder, single episode, unspecified: Secondary | ICD-10-CM | POA: Diagnosis not present

## 2016-08-11 DIAGNOSIS — Z955 Presence of coronary angioplasty implant and graft: Secondary | ICD-10-CM | POA: Diagnosis not present

## 2016-08-11 DIAGNOSIS — I252 Old myocardial infarction: Secondary | ICD-10-CM | POA: Diagnosis not present

## 2016-08-11 DIAGNOSIS — Z79899 Other long term (current) drug therapy: Secondary | ICD-10-CM | POA: Diagnosis not present

## 2016-08-11 DIAGNOSIS — Z7951 Long term (current) use of inhaled steroids: Secondary | ICD-10-CM | POA: Diagnosis not present

## 2016-08-11 DIAGNOSIS — G809 Cerebral palsy, unspecified: Secondary | ICD-10-CM | POA: Diagnosis not present

## 2016-08-11 DIAGNOSIS — I2510 Atherosclerotic heart disease of native coronary artery without angina pectoris: Secondary | ICD-10-CM | POA: Diagnosis not present

## 2016-08-11 DIAGNOSIS — K219 Gastro-esophageal reflux disease without esophagitis: Secondary | ICD-10-CM | POA: Diagnosis not present

## 2016-08-11 NOTE — Patient Instructions (Signed)
[name]  08/11/2016     @PREFPERIOPPHARMACY @   Your procedure is scheduled on  08/13/2016 .  Report to [name] at  930  A.M.  Call this number if you have problems the morning of surgery:  [phone]   Remember:  Do not eat food or drink liquids after midnight.  Take these medicines the morning of surgery with A SIP OF WATER  amolodine, lisinopril, prilosec, ultram. Use your inhaler before you come and bring your rescue inhaler with you.   Do not wear jewelry, make-up or nail polish.  Do not wear lotions, powders, or perfumes, or deoderant.  Do not shave 48 hours prior to surgery.  Men may shave face and neck.  Do not bring valuables to the hospital.  [facility] is not responsible for any belongings or valuables.  Contacts, dentures or bridgework may not be worn into surgery.  Leave your suitcase in the car.  After surgery it may be brought to your room.  For patients admitted to the hospital, discharge time will be determined by your treatment team.  Patients discharged the day of surgery will not be allowed to drive home.   Name and phone number of your driver:   family Special instructions:  None  Please read over the following fact sheets that you were given. Anesthesia Post-op Instructions and Care and Recovery After Surgery       Carpal Tunnel Release Carpal tunnel release is a surgical procedure to relieve numbness and pain in your hand that are caused by carpal tunnel syndrome. Your carpal tunnel is a narrow, hollow space in your wrist. It passes between your wrist bones and a band of connective tissue (transverse carpal ligament). The nerve that supplies most of your hand (median nerve) passes through this space, and so do the connections between your fingers and the muscles of your arm (tendons). Carpal tunnel syndrome makes this space swell and become narrow, and this causes pain and numbness. In carpal tunnel release surgery, a surgeon cuts  through the transverse carpal ligament to make more room in the carpal tunnel space. You may have this surgery if other types of treatment have not worked. Tell a health care provider about:  Any allergies you have.  All medicines you are taking, including vitamins, herbs, eye drops, creams, and over-the-counter medicines.  Any problems you or family members have had with anesthetic medicines.  Any blood disorders you have.  Any surgeries you have had.  Any medical conditions you have. What are the risks? Generally, this is a safe procedure. However, problems may occur, including:  Bleeding.  Infection.  Injury to the median nerve.  Need for additional surgery. What happens before the procedure?  Ask your health care provider about:  Changing or stopping your regular medicines. This is especially important if you are taking diabetes medicines or blood thinners.  Taking medicines such as aspirin and ibuprofen. These medicines can thin your blood. Do not take these medicines before your procedure if your health care provider instructs you not to.  Do not eat or drink anything after midnight on the night before the procedure or as directed by your health care provider.  Plan to have someone take you home after the procedure. What happens during the procedure?  An IV tube may be inserted into a vein.  You will be given one of the following:  A medicine that numbs the wrist area (local  anesthetic). You may also be given a medicine to make you relax (sedative).  A medicine that makes you go to sleep (general anesthetic).  Your arm, hand, and wrist will be cleaned with a germ-killing solution (antiseptic).  Your surgeon will make a surgical cut (incision) over the palm side of your wrist. The surgeon will pull aside the skin of your wrist to expose the carpal tunnel space.  The surgeon will cut the transverse carpal ligament.  The edges of the incision will be closed with  stitches (sutures) or staples.  A bandage (dressing) will be placed over your wrist and wrapped around your hand and wrist. What happens after the procedure?  You may spend some time in a recovery area.  Your blood pressure, heart rate, breathing rate, and blood oxygen level will be monitored often until the medicines you were given have worn off.  You will likely have some pain. You will be given pain medicine.  You may need to wear a splint or a wrist brace over your dressing. This information is not intended to replace advice given to you by your health care provider. Make sure you discuss any questions you have with your health care provider. Document Released: 06/27/2003 Document Revised: 09/12/2015 Document Reviewed: 11/22/2013 Elsevier Interactive Patient Education  2017 [place] After Refer to this sheet in the next few weeks. These instructions provide you with information about caring for yourself after your procedure. Your health care provider may also give you more specific instructions. Your treatment has been planned according to current medical practices, but problems sometimes occur. Call your health care provider if you have any problems or questions after your procedure. What can I expect after the procedure? After your procedure, it is typical to have the following:  Pain.  Numbness.  Tingling.  Swelling.  Stiffness.  Bruising. Follow these instructions at home:  Take medicines only as directed by your health care provider.  There are many different ways to close and cover an incision, including stitches (sutures), skin glue, and adhesive strips. Follow your health care provider's instructions about:  Incision care.  Bandage (dressing) changes and removal.  Incision closure removal.  Wear a splint or a brace as directed by your surgeon. You may need to do this for 2-3 weeks.  Keep your hand raised (elevated) above the  level of your heart while you are resting. Move your fingers often.  Avoid activities that cause hand pain.  Ask your surgeon when you can start to do all of your usual activities again, such as:  Driving.  Returning to work.  Bathing and swimming.  Keep all follow-up visits as directed by your health care provider. This is important. You may need physical therapy for several months to speed healing and regain movement. Contact a health care provider if:  You have drainage, redness, swelling, or pain at your incision site.  You have a fever.  You have chills.  Your pain medicine is not working.  Your symptoms do not go away after 2 months.  Your symptoms go away and then return. Get help right away if:  You have pain or numbness that is getting worse.  Your fingers change color.  You are not able to move your fingers. This information is not intended to replace advice given to you by your health care provider. Make sure you discuss any questions you have with your health care provider. Document Released: 10/24/2004 Document Revised: 09/12/2015 Document  Reviewed: 11/22/2013 Elsevier Interactive Patient Education  2017 [employer] PATIENT INSTRUCTIONS POST-ANESTHESIA  IMMEDIATELY FOLLOWING SURGERY:  Do not drive or operate machinery for the first twenty four hours after surgery.  Do not make any important decisions for twenty four hours after surgery or while taking narcotic pain medications or sedatives.  If you develop intractable nausea and vomiting or a severe headache please notify your doctor immediately.  FOLLOW-UP:  Please make an appointment with your surgeon as instructed. You do not need to follow up with anesthesia unless specifically instructed to do so.  WOUND CARE INSTRUCTIONS (if applicable):  Keep a dry clean dressing on the anesthesia/puncture wound site if there is drainage.  Once the wound has quit draining you may leave it open to air.  Generally you  should leave the bandage intact for twenty four hours unless there is drainage.  If the epidural site drains for more than 36-48 hours please call the anesthesia department.  QUESTIONS?:  Please feel free to call your physician or the hospital operator if you have any questions, and they will be happy to assist you.

## 2016-08-12 NOTE — Pre-Procedure Instructions (Signed)
Late entry. Patient in for PAT on 08/11/2016 at 1430 accompanied by sister. Sister states that the last time patient came for PAT, he came by himself and she feels there was some confusion about his history due to his medical condition and the fact that he has difficulty explaining himself. She states he was canceled before because he was not on medications for seizures and that in his trying to explain past history before, he was misunderstood. She states he as CP and had seizures as a child and was on medication for seizures for a period of time as a child but that he has not been on medications since he was a child and has not had seizures since then. Due to canceling before they went to see Dr Merlene Laughter to rule theses out.and he had a CT scan done and was told by Dr Merlene Laughter that he had no seizure activity and needed no medications for this (see his note in epic).

## 2016-08-12 NOTE — H&P (Signed)
Chief Complaint  Patient presents with  . Carpal Tunnel      Right wrist      HPI [name] is a 61 y.o. male.  Presents for evaluation of pain and paresthesias of his right upper extremity   Approximate 2 years ago the patient began to have gradual onset of pain and paresthesias weakness in the right upper extremity associated numbness and tingling and nocturnal symptoms   He was treated with bracing an injection and eventually a carpal tunnel test which showed carpal tunnel syndrome on the right read as mild to moderate bihilar and neurology   The patient presents now for evaluation for possible surgery   Review of Systems Review of Systems  Constitutional: Negative for fever.  Respiratory: Negative for shortness of breath.   Cardiovascular: Negative for chest pain.  Musculoskeletal: Positive for arthralgias.  Neurological: Positive for weakness and numbness.            Past Medical History:  Diagnosis Date  . Cerebral palsy ([place])    . Coronary artery disease    . Depression    . GERD (gastroesophageal reflux disease)    . Hypertension    . Scoliosis    . Seizures ([place])        Past Surgical History:  Procedure Laterality Date  . CORONARY ANGIOPLASTY WITH STENT PLACEMENT                Family History  Problem Relation Age of Onset  . CAD Father 7      CABG  . Heart disease Father    . CAD Brother    . Diabetes Mother    . Alcohol abuse Brother        Social History        Social History  Substance Use Topics  . Smoking status: Current Every Day Smoker      Packs/day: 1.00      Types: Cigarettes  . Smokeless tobacco: Never Used  . Alcohol use Yes         Comment: daily pt states "just a few"      No Known Allergies         Current Outpatient Prescriptions  Medication Sig Dispense Refill  . amLODipine (NORVASC) 5 MG tablet Take 1 tablet (5 mg total) by mouth daily. For blood pressure 90 tablet 1  . aspirin EC 81 MG tablet Take 1 tablet (81  mg total) by mouth daily. 90 tablet 0  . DULoxetine (CYMBALTA) 30 MG capsule TAKE UP TO 2 TABLETS ONCE DAILY AS DIRECTED WITH A FULL STOMACH AT SUPPER TIME 60 capsule 2  . lisinopril (PRINIVIL,ZESTRIL) 20 MG tablet Take 1 tablet (20 mg total) by mouth daily. 90 tablet 3  . methylPREDNISolone (MEDROL DOSEPAK) 4 MG TBPK tablet Take by mouth.      [name] omeprazole (PRILOSEC) 20 MG capsule TAKE (1) CAPSULE DAILY 60 capsule 1  . simvastatin (ZOCOR) 20 MG tablet TAKE ONE TABLET AT BEDTIME 90 tablet 1  . traMADol (ULTRAM) 50 MG tablet Take 100 mg by mouth every 6 (six) hours as needed.      . Blood Pressure KIT Check blood pressure regularly 1 each 0  . fluticasone (FLONASE) 50 MCG/ACT nasal spray Place 2 sprays into both nostrils daily. 16 g 6  . PROAIR HFA 108 (90 Base) MCG/ACT inhaler Inhale 1-2 puffs into the lungs every 6 (six) hours as needed for wheezing or shortness of breath. 2 Inhaler 2  No current facility-administered medications for this visit.         Physical Exam Blood pressure 122/79, pulse 81, weight 222 lb (100.7 kg). Physical Exam The patient is well developed well nourished and well groomed.  Orientation to person place and time is normal  Mood is pleasant.   Ambulatory status normal    Right Upper extremity examination reveals the following:   Inspection reveals no swelling. There is tenderness over the carpal tunnel. And volar forearm   Range of motion of the wrist and elbow are normal   Motor exam shows mild weakness with grip strength.   Wrist joint is stable   Provocative tests for carpal tunnel Phalen's test  Positive Carpal tunnel compression test positive    Pulses are normal in the radial and ulnar artery with a normal Allen's test.   Decreased sensation is noted in the median nerve distribution. Soft touch is normal.   Opposite extremityShows normal grip strength and range of motion   Data Reviewed Carpal tunnel testing shows positive right and left  median palmar responses are prolonged with reduced amplitude interpreted by [facility] neurology has bilateral mild to moderate median neuropathy Assessment    Right CTS   The patient is at bracing an injection and has had symptoms for 2 years and would like to proceed with carpal tunnel release     Plan     Right carpal tunnel release   This procedure has been fully reviewed

## 2016-08-13 ENCOUNTER — Encounter (HOSPITAL_COMMUNITY): Payer: Self-pay | Admitting: Anesthesiology

## 2016-08-13 ENCOUNTER — Encounter (HOSPITAL_COMMUNITY)
Admission: RE | Disposition: A | Discharge status: Home / Self Care | Payer: Self-pay | Source: Ambulatory Visit | Attending: Orthopedic Surgery

## 2016-08-13 ENCOUNTER — Ambulatory Visit (HOSPITAL_COMMUNITY): Payer: Medicare Other | Admitting: Anesthesiology

## 2016-08-13 ENCOUNTER — Ambulatory Visit (HOSPITAL_COMMUNITY)
Admission: RE | Admit: 2016-08-13 | Discharge: 2016-08-13 | Disposition: A | Discharge status: Home / Self Care | Payer: Medicare Other | Source: Ambulatory Visit | Attending: Orthopedic Surgery | Admitting: Orthopedic Surgery

## 2016-08-13 DIAGNOSIS — I10 Essential (primary) hypertension: Secondary | ICD-10-CM | POA: Insufficient documentation

## 2016-08-13 DIAGNOSIS — F17210 Nicotine dependence, cigarettes, uncomplicated: Secondary | ICD-10-CM | POA: Insufficient documentation

## 2016-08-13 DIAGNOSIS — G5601 Carpal tunnel syndrome, right upper limb: Secondary | ICD-10-CM | POA: Insufficient documentation

## 2016-08-13 DIAGNOSIS — Z79899 Other long term (current) drug therapy: Secondary | ICD-10-CM | POA: Diagnosis not present

## 2016-08-13 DIAGNOSIS — I252 Old myocardial infarction: Secondary | ICD-10-CM | POA: Insufficient documentation

## 2016-08-13 DIAGNOSIS — Z7951 Long term (current) use of inhaled steroids: Secondary | ICD-10-CM | POA: Insufficient documentation

## 2016-08-13 DIAGNOSIS — Z955 Presence of coronary angioplasty implant and graft: Secondary | ICD-10-CM | POA: Insufficient documentation

## 2016-08-13 DIAGNOSIS — G809 Cerebral palsy, unspecified: Secondary | ICD-10-CM | POA: Insufficient documentation

## 2016-08-13 DIAGNOSIS — F329 Major depressive disorder, single episode, unspecified: Secondary | ICD-10-CM | POA: Diagnosis not present

## 2016-08-13 DIAGNOSIS — K219 Gastro-esophageal reflux disease without esophagitis: Secondary | ICD-10-CM | POA: Diagnosis not present

## 2016-08-13 DIAGNOSIS — I2510 Atherosclerotic heart disease of native coronary artery without angina pectoris: Secondary | ICD-10-CM | POA: Insufficient documentation

## 2016-08-13 DIAGNOSIS — Z7982 Long term (current) use of aspirin: Secondary | ICD-10-CM | POA: Insufficient documentation

## 2016-08-13 HISTORY — PX: CARPAL TUNNEL RELEASE: SHX101

## 2016-08-13 SURGERY — CARPAL TUNNEL RELEASE
Anesthesia: Regional | Site: Wrist | Laterality: Right

## 2016-08-13 MED ORDER — MIDAZOLAM HCL 2 MG/2ML IJ SOLN
INTRAMUSCULAR | Status: AC
  Filled 2016-08-13: qty 2

## 2016-08-13 MED ORDER — FENTANYL CITRATE (PF) 100 MCG/2ML IJ SOLN
INTRAMUSCULAR | Status: AC
  Filled 2016-08-13: qty 2

## 2016-08-13 MED ORDER — FENTANYL CITRATE (PF) 100 MCG/2ML IJ SOLN: 25.0000 ug | INTRAMUSCULAR | Status: DC | PRN

## 2016-08-13 MED ORDER — PROPOFOL 500 MG/50ML IV EMUL
INTRAVENOUS | Status: DC | PRN
  Administered 2016-08-13: 50 ug/kg/min via INTRAVENOUS

## 2016-08-13 MED ORDER — IBUPROFEN 400 MG PO TABS: 400.0000 mg | ORAL_TABLET | ORAL | 0 refills | Status: DC | PRN

## 2016-08-13 MED ORDER — PROPOFOL 10 MG/ML IV BOLUS
INTRAVENOUS | Status: AC
  Filled 2016-08-13: qty 20

## 2016-08-13 MED ORDER — ARTIFICIAL TEARS OPHTHALMIC OINT
TOPICAL_OINTMENT | OPHTHALMIC | Status: AC
  Filled 2016-08-13: qty 3.5

## 2016-08-13 MED ORDER — BUPIVACAINE HCL (PF) 0.5 % IJ SOLN
INTRAMUSCULAR | Status: DC | PRN
  Administered 2016-08-13: 20 mL

## 2016-08-13 MED ORDER — BUPIVACAINE HCL (PF) 0.5 % IJ SOLN
INTRAMUSCULAR | Status: AC
  Filled 2016-08-13: qty 30

## 2016-08-13 MED ORDER — MIDAZOLAM HCL 5 MG/5ML IJ SOLN
INTRAMUSCULAR | Status: DC | PRN
  Administered 2016-08-13 (×2): 1 mg via INTRAVENOUS

## 2016-08-13 MED ORDER — SODIUM CHLORIDE 0.9% FLUSH
INTRAVENOUS | Status: AC
  Filled 2016-08-13: qty 10

## 2016-08-13 MED ORDER — 0.9 % SODIUM CHLORIDE (POUR BTL) OPTIME
TOPICAL | Status: DC | PRN
  Administered 2016-08-13: 1000 mL

## 2016-08-13 MED ORDER — LIDOCAINE HCL (PF) 0.5 % IJ SOLN
INTRAMUSCULAR | Status: AC
  Filled 2016-08-13: qty 50

## 2016-08-13 MED ORDER — CHLORHEXIDINE GLUCONATE 4 % EX LIQD: 60.0000 mL | Freq: Once | CUTANEOUS | Status: DC

## 2016-08-13 MED ORDER — LIDOCAINE HCL (PF) 0.5 % IJ SOLN
INTRAMUSCULAR | Status: DC | PRN
  Administered 2016-08-13: 50 mL

## 2016-08-13 MED ORDER — FENTANYL CITRATE (PF) 100 MCG/2ML IJ SOLN
25.0000 ug | INTRAMUSCULAR | Status: AC
  Administered 2016-08-13: 25 ug via INTRAVENOUS

## 2016-08-13 MED ORDER — MIDAZOLAM HCL 2 MG/2ML IJ SOLN
1.0000 mg | INTRAMUSCULAR | Status: AC
  Administered 2016-08-13: 2 mg via INTRAVENOUS

## 2016-08-13 MED ORDER — HYDROCODONE-ACETAMINOPHEN 5-325 MG PO TABS: 1.0000 | ORAL_TABLET | ORAL | 0 refills | Status: DC | PRN

## 2016-08-13 MED ORDER — LACTATED RINGERS IV SOLN
INTRAVENOUS | Status: DC
  Administered 2016-08-13: 11:00:00 via INTRAVENOUS

## 2016-08-13 MED ORDER — CEFAZOLIN SODIUM-DEXTROSE 2-4 GM/100ML-% IV SOLN
2.0000 g | INTRAVENOUS | Status: AC
  Administered 2016-08-13: 2 g via INTRAVENOUS
  Filled 2016-08-13: qty 100

## 2016-08-13 SURGICAL SUPPLY — 41 items
BAG HAMPER (MISCELLANEOUS) ×3 IMPLANT
BANDAGE ELASTIC 3 LF NS (GAUZE/BANDAGES/DRESSINGS) ×3 IMPLANT
BANDAGE ESMARK 4X12 BL STRL LF (DISPOSABLE) ×1 IMPLANT
BLADE SURG 15 STRL LF DISP TIS (BLADE) ×2 IMPLANT
BLADE SURG 15 STRL SS (BLADE) ×4
BNDG COHESIVE 4X5 TAN STRL (GAUZE/BANDAGES/DRESSINGS) ×3 IMPLANT
BNDG ESMARK 4X12 BLUE STRL LF (DISPOSABLE) ×3
BNDG GAUZE ELAST 4 BULKY (GAUZE/BANDAGES/DRESSINGS) ×3 IMPLANT
CHLORAPREP W/TINT 26ML (MISCELLANEOUS) ×3 IMPLANT
CLOTH BEACON ORANGE TIMEOUT ST (SAFETY) ×3 IMPLANT
COVER LIGHT HANDLE STERIS (MISCELLANEOUS) ×6 IMPLANT
CUFF TOURNIQUET SINGLE 18IN (TOURNIQUET CUFF) ×3 IMPLANT
DECANTER SPIKE VIAL GLASS SM (MISCELLANEOUS) ×3 IMPLANT
DRAPE PROXIMA HALF (DRAPES) ×3 IMPLANT
DRSG XEROFORM 1X8 (GAUZE/BANDAGES/DRESSINGS) ×3 IMPLANT
ELECT NEEDLE TIP 2.8 STRL (NEEDLE) ×3 IMPLANT
ELECT REM PT RETURN 9FT ADLT (ELECTROSURGICAL) ×3
ELECTRODE REM PT RTRN 9FT ADLT (ELECTROSURGICAL) ×1 IMPLANT
GAUZE SPONGE 4X4 12PLY STRL (GAUZE/BANDAGES/DRESSINGS) ×3 IMPLANT
GLOVE BIO SURGEON STRL SZ 6.5 (GLOVE) ×2 IMPLANT
GLOVE BIO SURGEONS STRL SZ 6.5 (GLOVE) ×1
GLOVE BIOGEL PI IND STRL 6.5 (GLOVE) ×1 IMPLANT
GLOVE BIOGEL PI IND STRL 7.0 (GLOVE) ×1 IMPLANT
GLOVE BIOGEL PI INDICATOR 6.5 (GLOVE) ×2
GLOVE BIOGEL PI INDICATOR 7.0 (GLOVE) ×2
GLOVE SKINSENSE NS SZ8.0 LF (GLOVE) ×2
GLOVE SKINSENSE STRL SZ8.0 LF (GLOVE) ×1 IMPLANT
GLOVE SS N UNI LF 8.5 STRL (GLOVE) ×3 IMPLANT
GOWN STRL REUS W/ TWL LRG LVL3 (GOWN DISPOSABLE) ×1 IMPLANT
GOWN STRL REUS W/TWL LRG LVL3 (GOWN DISPOSABLE) ×2
GOWN STRL REUS W/TWL XL LVL3 (GOWN DISPOSABLE) ×3 IMPLANT
HAND ALUMI XLG (SOFTGOODS) ×3 IMPLANT
KIT ROOM TURNOVER APOR (KITS) ×3 IMPLANT
MANIFOLD NEPTUNE II (INSTRUMENTS) ×3 IMPLANT
NEEDLE HYPO 21X1.5 SAFETY (NEEDLE) ×3 IMPLANT
NS IRRIG 1000ML POUR BTL (IV SOLUTION) ×3 IMPLANT
PACK BASIC LIMB (CUSTOM PROCEDURE TRAY) ×3 IMPLANT
PAD ARMBOARD 7.5X6 YLW CONV (MISCELLANEOUS) ×3 IMPLANT
SET BASIN LINEN APH (SET/KITS/TRAYS/PACK) ×3 IMPLANT
SUT ETHILON 3 0 FSL (SUTURE) ×3 IMPLANT
SYR CONTROL 10ML LL (SYRINGE) ×3 IMPLANT

## 2016-08-13 NOTE — Brief Op Note (Signed)
08/13/2016  12:04 PM  PATIENT:  [name]  61 y.o. male  PRE-OPERATIVE DIAGNOSIS:  right carpal tunnel syndrome  POST-OPERATIVE DIAGNOSIS:  right carpal tunnel syndrome    PROCEDURE:  Procedure(s): CARPAL TUNNEL RELEASE (Right) 64721  Carpal tunnel release right wrist  Preop diagnosis carpal tunnel syndrome right wrist postop diagnosis same  Procedure open carpal tunnel release right wrist  Surgeon Aline Brochure  Anesthesia regional Bier block  Operative findings compression of the right median nerveoccupied lesions  Indications failure of conservative treatment to relieve pain and paresthesias and numbness and tingling of the right hand.  The patient was identified in the preop area we confirm the surgical site marked as right wrist. Chart update completed. Patient taken to surgery. She had 2 g of Ancef. After establishing a Bier block her arm was prepped with ChloraPrep.  Timeout executed completed and confirmed site.  A straight incision was made over the carpal tunnel in line with the radial border of the ring finger. Blunt dissection was carried out to find the distal aspect of the carpal tunnel. A blunted judgment was passed beneath the carpal tunnel. Sharp incision was then used to release the transverse carpal ligament. The contents of the carpal tunnel were inspected. The median nerve was compressed, moderate to severe synovitis noted in the carpal tunnel contents  The wound was irrigated and then closed with 3-0 nylon suture. We injected 10 mL of plain Marcaine on the radial side of the incision  A sterile bandage was applied and the tourniquet was released the color of the hand and capillary refill were normal  The patient was taken to the recovery room in stable condition SURGEON:  Surgeon(s) and Role:    * Carole Civil, MD - Primary  Surgical findings compression of the median nerve color looked okay light; a lot of synovitis was noted throughout the  carpal tunnel  PHYSICIAN ASSISTANT:   ASSISTANTS: none   ANESTHESIA:   regional  EBL:  Total I/O In: 400 [I.V.:400] Out: 5 [Blood:5]  BLOOD ADMINISTERED:none  DRAINS: none   LOCAL MEDICATIONS USED:  MARCAINE     SPECIMEN:  No Specimen  DISPOSITION OF SPECIMEN:  N/A  COUNTS:  YES  TOURNIQUET:   Total Tourniquet Time Documented: Upper Arm (Right) - 28 minutes Total: Upper Arm (Right) - 28 minutes   DICTATION: .Viviann Spare Dictation  PLAN OF CARE: Discharge to home after PACU  PATIENT DISPOSITION:  PACU - hemodynamically stable.   Delay start of Pharmacological VTE agent (>24hrs) due to surgical blood loss or risk of bleeding: not applicable

## 2016-08-13 NOTE — Anesthesia Procedure Notes (Signed)
Anesthesia Regional Block: Bier block (IV Regional)   Pre-Anesthetic Checklist: ,, timeout performed, Correct Patient, Correct Site, Correct Laterality, Correct Procedure,, site marked, surgical consent,, at surgeon's request  Laterality: Right     Needles:  Injection technique: Single-shot  Needle Type: Other      Needle Gauge: 22     Additional Needles:   Procedures:,,,,,,, Esmarch exsanguination, single tourniquet utilized,   Nerve Stimulator or Paresthesia:   Additional Responses:  Pulse checked post tourniquet inflation. IV NSL discontinued post injection. Narrative:  Start time: 08/13/2016 11:36 AM  Performed by: Personally

## 2016-08-13 NOTE — Transfer of Care (Signed)
Immediate Anesthesia Transfer of Care Note  Patient: [name]  Procedure(s) Performed: Procedure(s): CARPAL TUNNEL RELEASE (Right)  Patient Location: PACU  Anesthesia Type:Bier block  Level of Consciousness: awake, alert , oriented and patient cooperative  Airway & Oxygen Therapy: Patient Spontanous Breathing and Patient connected to nasal cannula oxygen  Post-op Assessment: Report given to RN and Post -op Vital signs reviewed and stable  Post vital signs: Reviewed and stable  Last Vitals:  Vitals:   08/13/16 1052 08/13/16 1112  BP: 110/76 106/76  Pulse: 78   Resp: 16 (!) 24  Temp: 36.7 C     Last Pain:  Vitals:   08/13/16 1052  TempSrc: Oral      Patients Stated Pain Goal: 6 (73/40/37 0964)  Complications: No apparent anesthesia complications

## 2016-08-13 NOTE — Interval H&P Note (Signed)
History and Physical Interval Note:  08/13/2016 11:05 AM  [name]  has presented today for surgery, with the diagnosis of right carpal tunnel syndrom  The various methods of treatment have been discussed with the patient and family. After consideration of risks, benefits and other options for treatment, the patient has consented to  Procedure(s): CARPAL TUNNEL RELEASE (Right) as a surgical intervention .  The patient's history has been reviewed, patient examined, no change in status, stable for surgery.  I have reviewed the patient's chart and labs.  Questions were answered to the patient's satisfaction.     Arther Abbott

## 2016-08-13 NOTE — Anesthesia Procedure Notes (Signed)
Procedure Name: MAC Date/Time: 08/13/2016 11:23 AM Performed by: Andree Elk, AMY A Pre-anesthesia Checklist: Patient identified, Timeout performed, Emergency Drugs available, Suction available and Patient being monitored Oxygen Delivery Method: Simple face mask

## 2016-08-13 NOTE — Discharge Instructions (Signed)
Elevate the hand for 72 hours.  Apply ice packs as needed to control swelling.  Moves your fingers every hour opening and closing the hand to make a closed fist 10 times per hour while awake Keep dressing clean and dry doctor will change in the office  Carpal Tunnel Release, Care After Refer to this sheet in the next few weeks. These instructions provide you with information about caring for yourself after your procedure. Your health care provider may also give you more specific instructions. Your treatment has been planned according to current medical practices, but problems sometimes occur. Call your health care provider if you have any problems or questions after your procedure. What can I expect after the procedure? After your procedure, it is typical to have the following:  Pain.  Numbness.  Tingling.  Swelling.  Stiffness.  Bruising. Follow these instructions at home:  Take medicines only as directed by your health care provider.  There are many different ways to close and cover an incision, including stitches (sutures), skin glue, and adhesive strips. Follow your health care provider's instructions about:  Incision care.  Bandage (dressing) changes and removal.  Incision closure removal.  Wear a splint or a brace as directed by your surgeon. You may need to do this for 2-3 weeks.  Keep your hand raised (elevated) above the level of your heart while you are resting. Move your fingers often.  Avoid activities that cause hand pain.  Ask your surgeon when you can start to do all of your usual activities again, such as:  Driving.  Returning to work.  Bathing and swimming.  Keep all follow-up visits as directed by your health care provider. This is important. You may need physical therapy for several months to speed healing and regain movement. Contact a health care provider if:  You have drainage, redness, swelling, or pain at your incision site.  You have a  fever.  You have chills.  Your pain medicine is not working.  Your symptoms do not go away after 2 months.  Your symptoms go away and then return. Get help right away if:  You have pain or numbness that is getting worse.  Your fingers change color.  You are not able to move your fingers. This information is not intended to replace advice given to you by your health care provider. Make sure you discuss any questions you have with your health care provider. Document Released: 10/24/2004 Document Revised: 09/12/2015 Document Reviewed: 11/22/2013 Elsevier Interactive Patient Education  2017 [employer].

## 2016-08-13 NOTE — Op Note (Signed)
08/13/2016  12:04 PM  PATIENT:  [name]  61 y.o. male  PRE-OPERATIVE DIAGNOSIS:  right carpal tunnel syndrome  POST-OPERATIVE DIAGNOSIS:  right carpal tunnel syndrome    PROCEDURE:  Procedure(s): CARPAL TUNNEL RELEASE (Right) 64721  Carpal tunnel release right wrist  Preop diagnosis carpal tunnel syndrome right wrist postop diagnosis same  Procedure open carpal tunnel release right wrist  Surgeon Aline Brochure  Anesthesia regional Bier block  Operative findings compression of the right median nerveoccupied lesions  Indications failure of conservative treatment to relieve pain and paresthesias and numbness and tingling of the right hand.  The patient was identified in the preop area we confirm the surgical site marked as right wrist. Chart update completed. Patient taken to surgery. She had 2 g of Ancef. After establishing a Bier block her arm was prepped with ChloraPrep.  Timeout executed completed and confirmed site.  A straight incision was made over the carpal tunnel in line with the radial border of the ring finger. Blunt dissection was carried out to find the distal aspect of the carpal tunnel. A blunted judgment was passed beneath the carpal tunnel. Sharp incision was then used to release the transverse carpal ligament. The contents of the carpal tunnel were inspected. The median nerve was compressed, moderate to severe synovitis noted in the carpal tunnel contents  The wound was irrigated and then closed with 3-0 nylon suture. We injected 10 mL of plain Marcaine on the radial side of the incision  A sterile bandage was applied and the tourniquet was released the color of the hand and capillary refill were normal  The patient was taken to the recovery room in stable condition SURGEON:  Surgeon(s) and Role:    * Carole Civil, MD - Primary  Surgical findings compression of the median nerve color looked okay light; a lot of synovitis was noted throughout the  carpal tunnel  PHYSICIAN ASSISTANT:   ASSISTANTS: none   ANESTHESIA:   regional  EBL:  Total I/O In: 400 [I.V.:400] Out: 5 [Blood:5]  BLOOD ADMINISTERED:none  DRAINS: none   LOCAL MEDICATIONS USED:  MARCAINE     SPECIMEN:  No Specimen  DISPOSITION OF SPECIMEN:  N/A  COUNTS:  YES  TOURNIQUET:   Total Tourniquet Time Documented: Upper Arm (Right) - 28 minutes Total: Upper Arm (Right) - 28 minutes   DICTATION: .Viviann Spare Dictation  PLAN OF CARE: Discharge to home after PACU  PATIENT DISPOSITION:  PACU - hemodynamically stable.   Delay start of Pharmacological VTE agent (>24hrs) due to surgical blood loss or risk of bleeding: not applicable

## 2016-08-13 NOTE — Anesthesia Preprocedure Evaluation (Signed)
Anesthesia Evaluation  Patient identified by MRN, date of birth, ID band Patient awake    Reviewed: Allergy & Precautions, NPO status , Patient's Chart, lab work & pertinent test results  Airway Mallampati: II  TM Distance: >3 FB     Dental  (+) Partial Upper, Poor Dentition   Pulmonary Current Smoker,    breath sounds clear to auscultation       Cardiovascular hypertension, Pt. on medications (-) angina+ CAD, + Past MI and + Cardiac Stents   Rhythm:Regular Rate:Normal     Neuro/Psych Seizures - (not active),  Cerebral Palsy     GI/Hepatic GERD  Controlled and Medicated,  Endo/Other    Renal/GU      Musculoskeletal   Abdominal   Peds  Hematology   Anesthesia Other Findings   Reproductive/Obstetrics                             Anesthesia Physical Anesthesia Plan  ASA: III  Anesthesia Plan: Bier Block   Post-op Pain Management:    Induction: Intravenous  Airway Management Planned: Simple Face Mask  Additional Equipment:   Intra-op Plan:   Post-operative Plan:   Informed Consent: I have reviewed the patients History and Physical, chart, labs and discussed the procedure including the risks, benefits and alternatives for the proposed anesthesia with the patient or authorized representative who has indicated his/her understanding and acceptance.     Plan Discussed with:   Anesthesia Plan Comments:         Anesthesia Quick Evaluation

## 2016-08-13 NOTE — Anesthesia Postprocedure Evaluation (Signed)
Anesthesia Post Note  Patient: [name]  Procedure(s) Performed: Procedure(s) (LRB): CARPAL TUNNEL RELEASE (Right)  Patient location during evaluation: PACU Anesthesia Type: Bier Block Level of consciousness: awake and alert and oriented Pain management: pain level controlled Vital Signs Assessment: post-procedure vital signs reviewed and stable Respiratory status: spontaneous breathing and patient connected to nasal cannula oxygen Cardiovascular status: stable Postop Assessment: no signs of nausea or vomiting Anesthetic complications: no     Last Vitals:  Vitals:   08/13/16 1052 08/13/16 1112  BP: 110/76 106/76  Pulse: 78   Resp: 16 (!) 24  Temp: 36.7 C     Last Pain:  Vitals:   08/13/16 1052  TempSrc: Oral                 ADAMS, AMY A

## 2016-08-14 ENCOUNTER — Telehealth: Payer: Self-pay | Admitting: Orthopedic Surgery

## 2016-08-14 NOTE — Telephone Encounter (Signed)
See chart notes.  Clearance received.  Surgery completed 08/13/16, per chart.

## 2016-08-14 NOTE — Progress Notes (Signed)
Patient states he has follow-up appointment with Dr. Aline Brochure on Monday. Patient states he would like to have home health care d/t inability to care for himself. His sister is helping him with ADLs at this time. I instructed patient to call Dr. Ruthe Mannan office today to request home health care. Patient verbalizes understanding.

## 2016-08-14 NOTE — Telephone Encounter (Signed)
Patient called, left voice message, inquiring about home health, status/post carpal tunnel surgery, done yesterday, 08/13/16.  Patient is scheduled for post op visit on Monday, 08/17/16.  Best call back phone#'s are sisters phone#'s, designated party contact form on file: [name] (Sister) [name] (Sister)  [phone]  Patient ph# [phone]

## 2016-08-17 ENCOUNTER — Ambulatory Visit (INDEPENDENT_AMBULATORY_CARE_PROVIDER_SITE_OTHER): Payer: Self-pay | Admitting: Orthopedic Surgery

## 2016-08-17 ENCOUNTER — Encounter (HOSPITAL_COMMUNITY): Payer: Self-pay | Admitting: Orthopedic Surgery

## 2016-08-17 ENCOUNTER — Other Ambulatory Visit: Payer: Self-pay | Admitting: Family Medicine

## 2016-08-17 DIAGNOSIS — G5601 Carpal tunnel syndrome, right upper limb: Secondary | ICD-10-CM

## 2016-08-17 DIAGNOSIS — Z9889 Other specified postprocedural states: Secondary | ICD-10-CM

## 2016-08-17 DIAGNOSIS — Z5189 Encounter for other specified aftercare: Secondary | ICD-10-CM

## 2016-08-17 MED ORDER — HYDROCODONE-ACETAMINOPHEN 5-325 MG PO TABS: 1.0000 | ORAL_TABLET | ORAL | 0 refills | Status: DC | PRN

## 2016-08-17 NOTE — Patient Instructions (Signed)
Exercise the hand

## 2016-08-17 NOTE — Progress Notes (Signed)
Postop visit #1 status post knee arthroscopy  Chief Complaint  Patient presents with  . Follow-up    Post op recheck on right CTR, DOS 08-13-16.     No outpatient prescriptions have been marked as taking for the 08/17/16 encounter (Office Visit) with Carole Civil, MD.   Meds ordered this encounter  Medications  . HYDROcodone-acetaminophen (NORCO/VICODIN) 5-325 MG tablet    Sig: Take 1 tablet by mouth every 4 (four) hours as needed for moderate pain.    Dispense:  20 tablet    Refill:  0    Wound check carpal tunnel release right wrist. Wound looks clean dry and intact sterile dressing applied  Patient will have refill of his pain medication and follow-up for suture removal next week

## 2016-08-17 NOTE — Telephone Encounter (Signed)
WILL ADDRESS AT TIME OF OV, HOME HEALTH AIDE CANNOT BE ORDERED WITHOUT SKILLED NURSE OR PT ORDER, NEITHER OF WHICH ARE INDICATED AFTER CARPAL TUNNEL SURGERY

## 2016-08-28 ENCOUNTER — Ambulatory Visit (INDEPENDENT_AMBULATORY_CARE_PROVIDER_SITE_OTHER): Payer: Self-pay | Admitting: Orthopedic Surgery

## 2016-08-28 ENCOUNTER — Encounter: Payer: Self-pay | Admitting: Orthopedic Surgery

## 2016-08-28 DIAGNOSIS — Z9889 Other specified postprocedural states: Secondary | ICD-10-CM

## 2016-08-28 DIAGNOSIS — G5601 Carpal tunnel syndrome, right upper limb: Secondary | ICD-10-CM

## 2016-08-28 MED ORDER — HYDROCODONE-ACETAMINOPHEN 5-325 MG PO TABS: 1.0000 | ORAL_TABLET | ORAL | 0 refills | Status: DC | PRN

## 2016-08-28 NOTE — Progress Notes (Signed)
Chief Complaint  Patient presents with  . Follow-up    RT CTR, DOS 08/13/16   Encounter Diagnoses  Name Primary?  . Carpal tunnel syndrome of right wrist Yes  . History of carpal tunnel surgery of right wrist     Meds ordered this encounter  Medications  . HYDROcodone-acetaminophen (NORCO/VICODIN) 5-325 MG tablet    Sig: Take 1 tablet by mouth every 4 (four) hours as needed for moderate pain.    Dispense:  30 tablet    Refill:  0    CLEAN INCISION HEALED WOUND   NUMBNESS HAS RESOLVED   HE HAS FROM   FU AS NEEDED

## 2016-09-02 ENCOUNTER — Encounter: Payer: Self-pay | Admitting: Family Medicine

## 2016-09-02 ENCOUNTER — Ambulatory Visit (INDEPENDENT_AMBULATORY_CARE_PROVIDER_SITE_OTHER): Payer: Medicare Other | Admitting: Family Medicine

## 2016-09-02 VITALS — BP 116/76 | HR 88 | Temp 97.6°F | Ht 72.0 in | Wt 224.0 lb

## 2016-09-02 DIAGNOSIS — T63441A Toxic effect of venom of bees, accidental (unintentional), initial encounter: Secondary | ICD-10-CM

## 2016-09-02 DIAGNOSIS — I25119 Atherosclerotic heart disease of native coronary artery with unspecified angina pectoris: Secondary | ICD-10-CM

## 2016-09-03 ENCOUNTER — Telehealth: Payer: Self-pay | Admitting: Family Medicine

## 2016-09-03 MED ORDER — BETAMETHASONE SOD PHOS & ACET 6 (3-3) MG/ML IJ SUSP: 6.0000 mg | Freq: Once | INTRAMUSCULAR | Status: DC

## 2016-09-03 NOTE — Telephone Encounter (Signed)
Is this ok to do? Please advise

## 2016-09-03 NOTE — Telephone Encounter (Signed)
Yes, please.

## 2016-09-03 NOTE — Progress Notes (Signed)
Subjective:  Patient ID: [name], male    DOB: [birth year]  Age: 61 y.o. MRN: [id number]  CC: Insect Bite (pt here today c/o ?bee sting above the left eye 24 hours ago and it is very swollen and causing a headache)   HPI [name] presents for stung by a bee yesterday morning. It flew inside his helmet as he was driving his scooter. It stung him just lateral to the left lateral epicanthus. Has had a lot of swelling locally but no systemic sx such as palpitations, dyspnea, or difficulty swallowing. No pruritis.  History [name] has a past medical history of Cerebral palsy ([name]); Coronary artery disease; Depression; GERD (gastroesophageal reflux disease); Hypertension; Myocardial infarction [name]) (2008); Scoliosis; and Seizures ([name]).   He has a past surgical history that includes Coronary angioplasty with stent and Carpal tunnel release (Right, 08/13/2016).   His family history includes Alcohol abuse in his brother; CAD in his brother; CAD (age of onset: 71) in his father; Diabetes in his mother; Heart disease in his father.He reports that he has been smoking Cigarettes.  He has been smoking about 1.00 pack per day. He has never used smokeless tobacco. He reports that he drinks alcohol. He reports that he does not use drugs.  Current Outpatient Prescriptions on File Prior to Visit  Medication Sig Dispense Refill  . amLODipine (NORVASC) 5 MG tablet Take 1 tablet (5 mg total) by mouth daily. For blood pressure 90 tablet 1  . aspirin EC 81 MG tablet Take 1 tablet (81 mg total) by mouth daily. 90 tablet 0  . Aspirin-Salicylamide-Caffeine (BC FAST PAIN RELIEF) 650-195-33.3 MG PACK Take 1 packet by mouth every 8 (eight) hours as needed (for pain.).    [name] Blood Pressure KIT Check blood pressure regularly 1 each 0  . DULoxetine (CYMBALTA) 30 MG capsule TAKE UP TO 2 TABLETS ONCE DAILY AS DIRECTED WITH A FULL STOMACH AT SUPPER TIME 60 capsule 2  . fluticasone (FLONASE) 50 MCG/ACT nasal spray  Place 2 sprays into both nostrils daily. (Patient taking differently: Place 2 sprays into both nostrils daily as needed for allergies. ) 16 g 6  . fluticasone furoate-vilanterol (BREO ELLIPTA) 100-25 MCG/INH AEPB Inhale 1 puff into the lungs daily. 30 each 3  . HYDROcodone-acetaminophen (NORCO/VICODIN) 5-325 MG tablet Take 1 tablet by mouth every 4 (four) hours as needed for moderate pain. 30 tablet 0  . ibuprofen (ADVIL,MOTRIN) 400 MG tablet Take 1 tablet (400 mg total) by mouth every 4 (four) hours as needed. 90 tablet 0  . lisinopril (PRINIVIL,ZESTRIL) 20 MG tablet Take 1 tablet (20 mg total) by mouth daily. 90 tablet 1  . omeprazole (PRILOSEC) 20 MG capsule TAKE (1) CAPSULE DAILY 60 capsule 5  . PROAIR HFA 108 (90 Base) MCG/ACT inhaler Inhale 1-2 puffs into the lungs every 6 (six) hours as needed for wheezing or shortness of breath. 2 Inhaler 2  . simvastatin (ZOCOR) 20 MG tablet TAKE ONE TABLET AT BEDTIME 90 tablet 1   No current facility-administered medications on file prior to visit.     ROS Review of Systems  Constitutional: Negative for chills, fatigue and fever.  HENT: Negative for congestion and sore throat.   Eyes: Positive for pain, redness, itching and visual disturbance. Negative for photophobia.  Respiratory: Negative for cough, shortness of breath and wheezing.   Cardiovascular: Negative for chest pain.  Musculoskeletal: Negative for myalgias.  Skin: Negative for color change and rash.  Neurological: Negative for dizziness  and syncope.    Objective:  BP 116/76   Pulse 88   Temp 97.6 F (36.4 C) (Oral)   Ht 6' (1.829 m)   Wt 224 lb (101.6 kg)   BMI 30.38 kg/m   Physical Exam  Constitutional: He appears well-developed and well-nourished. No distress.  HENT:  Head: Normocephalic.  Right Ear: External ear normal.  Left Ear: External ear normal.  Nose: Nose normal.  Moderately severe edema at the left periorbital tissue and lids. Eye free of DC. No FB noted. Mid  conj. Injection.  Eyes: EOM are normal. Pupils are equal, round, and reactive to light.  Neck: Neck supple.  Cardiovascular: Normal rate and regular rhythm.   Pulmonary/Chest: Breath sounds normal.  Psychiatric: He has a normal mood and affect.    Assessment & Plan:   [name] was seen today for insect bite.  Diagnoses and all orders for this visit:  Bee sting reaction, accidental or unintentional, initial encounter -     betamethasone acetate-betamethasone sodium phosphate (CELESTONE) injection 6 mg; Inject 1 mL (6 mg total) into the muscle once.   I am having Mr. [name] maintain his aspirin EC, PROAIR HFA, simvastatin, Blood Pressure, amLODipine, Aspirin-Salicylamide-Caffeine, lisinopril, fluticasone, fluticasone furoate-vilanterol, omeprazole, ibuprofen, DULoxetine, and HYDROcodone-acetaminophen. We will continue to administer betamethasone acetate-betamethasone sodium phosphate.  Meds ordered this encounter  Medications  . betamethasone acetate-betamethasone sodium phosphate (CELESTONE) injection 6 mg     Follow-up: Return if symptoms worsen or fail to improve.  [name], M.D.

## 2016-09-03 NOTE — Telephone Encounter (Signed)
Can you help me with this one. Do you know what the status is of this home health order.

## 2016-09-07 NOTE — Telephone Encounter (Signed)
There has not been any home health ordered for this pt. He was here this morning and wants housekeeping??

## 2016-09-07 NOTE — Telephone Encounter (Signed)
Please contact the patient that him know that home health has been ordered for several weeks. If they have not heard from home health then there has been a Emergency planning/management officer. Please try to contact home health. Order is in the computer.  Thanks, WS

## 2016-09-08 NOTE — Telephone Encounter (Signed)
Pt is wanting PCS not home health. Referral sent to [facility]. He wants help with errands and cooking, cleaning, etc.

## 2016-09-09 ENCOUNTER — Other Ambulatory Visit: Payer: Self-pay | Admitting: Orthopedic Surgery

## 2016-09-09 DIAGNOSIS — Z9889 Other specified postprocedural states: Secondary | ICD-10-CM

## 2016-09-09 DIAGNOSIS — G5601 Carpal tunnel syndrome, right upper limb: Secondary | ICD-10-CM

## 2016-09-09 NOTE — Telephone Encounter (Signed)
IN COMPLIANCE WITH NEW STATE LAW PLEASE TRY NON OPIOID FORMS OF PAIN MEDICATION   TYLENOL   ADVIL  ALEVE  ICE   ASPERCREME  CAPZASIN

## 2016-09-28 ENCOUNTER — Ambulatory Visit: Payer: Medicare Other | Admitting: Pediatrics

## 2016-09-29 ENCOUNTER — Telehealth: Payer: Self-pay | Admitting: Family Medicine

## 2016-09-29 ENCOUNTER — Encounter: Payer: Self-pay | Admitting: Family Medicine

## 2016-09-30 ENCOUNTER — Encounter: Payer: Self-pay | Admitting: Physician Assistant

## 2016-09-30 ENCOUNTER — Ambulatory Visit (INDEPENDENT_AMBULATORY_CARE_PROVIDER_SITE_OTHER): Payer: Medicare Other | Admitting: Physician Assistant

## 2016-09-30 VITALS — BP 129/73 | HR 95 | Temp 98.0°F | Ht 72.0 in | Wt 218.6 lb

## 2016-09-30 DIAGNOSIS — L03115 Cellulitis of right lower limb: Secondary | ICD-10-CM | POA: Diagnosis not present

## 2016-09-30 DIAGNOSIS — I25119 Atherosclerotic heart disease of native coronary artery with unspecified angina pectoris: Secondary | ICD-10-CM | POA: Diagnosis not present

## 2016-09-30 MED ORDER — CEPHALEXIN 500 MG PO CAPS: 500.0000 mg | ORAL_CAPSULE | Freq: Two times a day (BID) | ORAL | 0 refills | Status: DC

## 2016-09-30 NOTE — Progress Notes (Signed)
Subjective:     Patient ID: [name], male   DOB: 1955/10/16, 61 y.o.   MRN: [id number]  HPI Pt with abrasion to the R ant lower leg Sl pain to the area He states he had swelling to the area before the injury No drainage from the site  Review of Systems  Constitutional: Negative for fatigue and fever.  Skin: Positive for color change and wound.       Objective:   Physical Exam  Constitutional: He appears well-developed and well-nourished.  Nursing note and vitals reviewed. Healing abrasion to the ant R mid tib area + surrounding erythema and induration + distal edema Minimal TTP No drainage noted     Assessment:     1. Cellulitis of right leg        Plan:     Elevation Wound care reviewed Keep area clean and dry Tetanus confirmed up to date Keflex 500mg  bid x 10 days Pt to f/u with Dr Livia Snellen regarding lower ext edema

## 2016-09-30 NOTE — Patient Instructions (Signed)

## 2016-10-05 ENCOUNTER — Encounter: Payer: Self-pay | Admitting: Family Medicine

## 2016-10-05 ENCOUNTER — Ambulatory Visit (INDEPENDENT_AMBULATORY_CARE_PROVIDER_SITE_OTHER): Payer: Medicare Other | Admitting: Family Medicine

## 2016-10-05 VITALS — BP 131/83 | HR 81 | Temp 98.1°F | Ht 72.0 in | Wt 221.0 lb

## 2016-10-05 DIAGNOSIS — I872 Venous insufficiency (chronic) (peripheral): Secondary | ICD-10-CM

## 2016-10-05 DIAGNOSIS — G809 Cerebral palsy, unspecified: Secondary | ICD-10-CM

## 2016-10-05 DIAGNOSIS — I25119 Atherosclerotic heart disease of native coronary artery with unspecified angina pectoris: Secondary | ICD-10-CM | POA: Diagnosis not present

## 2016-10-05 DIAGNOSIS — R600 Localized edema: Secondary | ICD-10-CM

## 2016-10-05 NOTE — Progress Notes (Signed)
Chief Complaint  Patient presents with  . Follow-up    pt here today following up for cellulitis of right lower leg which has gotten better and pt is still taking the antibiotics.    HPI  Patient presents today for  a sore on his shin. The right shin has actually improved from previous treatments but the sore seems to be slowly clearing up. She does not have any help bathing or cleaning his feet and legs. He says that agencies come out and take in some notes and made applications but he's still not had any help. He is not sure when it's can arrive. He states that without help you cannot take a bath or sit on the side of the tub to clean his feet just because he is at high risk for falling due to instability and weakness from his cerebral palsy. PMH: Smoking status noted ROS: Per HPI  Objective: BP 131/83   Pulse 81   Temp 98.1 F (36.7 C) (Oral)   Ht 6' (1.829 m)   Wt 221 lb (100.2 kg)   BMI 29.97 kg/m  Gen: NAD, alert, cooperative with exam HEENT: NCAT, EOMI, PERRL CV: RRR, good S1/S2, no murmur Resp: CTABL, no wheezes, non-labored Abd: SNTND, BS present, no guarding or organomegaly Ext: Moderate edema of the right lower extremity. There is a hyperemic healing crusted lesion in the center of the anterior shin. The foot has dirt caked on it with a putrid odor noted. Neuro: Alert and oriented, No gross deficits  Assessment and plan:  1. Cerebral palsy, unspecified type ([place])   2. Edema leg   3. Venous insufficiency of right lower extremity     No orders of the defined types were placed in this encounter.   Orders Placed This Encounter  Procedures  . DME Other see comment    Right lower extremity support stocking, 20-30 mmHg 2 pair   Wound care reviewed. We'll work on getting home health work within to get an Engineer, production once again. This time based on his frequent falls. Follow up as needed.  [name], MD

## 2016-10-20 ENCOUNTER — Ambulatory Visit: Payer: Self-pay | Admitting: Orthopedic Surgery

## 2016-10-23 ENCOUNTER — Encounter: Payer: Self-pay | Admitting: Family Medicine

## 2016-10-23 ENCOUNTER — Ambulatory Visit (INDEPENDENT_AMBULATORY_CARE_PROVIDER_SITE_OTHER): Payer: Medicare Other | Admitting: Family Medicine

## 2016-10-23 VITALS — BP 135/77 | HR 91 | Temp 99.3°F | Ht 72.0 in | Wt 220.0 lb

## 2016-10-23 DIAGNOSIS — G4441 Drug-induced headache, not elsewhere classified, intractable: Secondary | ICD-10-CM

## 2016-10-23 DIAGNOSIS — G809 Cerebral palsy, unspecified: Secondary | ICD-10-CM

## 2016-10-23 DIAGNOSIS — Z550 Illiteracy and low-level literacy: Secondary | ICD-10-CM | POA: Diagnosis not present

## 2016-10-23 DIAGNOSIS — I10 Essential (primary) hypertension: Secondary | ICD-10-CM | POA: Diagnosis not present

## 2016-10-23 DIAGNOSIS — I25119 Atherosclerotic heart disease of native coronary artery with unspecified angina pectoris: Secondary | ICD-10-CM | POA: Diagnosis not present

## 2016-10-23 DIAGNOSIS — R519 Headache, unspecified: Secondary | ICD-10-CM | POA: Insufficient documentation

## 2016-10-23 DIAGNOSIS — R51 Headache: Secondary | ICD-10-CM

## 2016-10-23 MED ORDER — BUTALBITAL-APAP-CAFF-COD 50-325-40-30 MG PO CAPS: 1.0000 | ORAL_CAPSULE | ORAL | 0 refills | Status: DC | PRN

## 2016-10-23 NOTE — Progress Notes (Signed)
Subjective:  Patient ID: [name], male    DOB: Mar 10, 1956  Age: 61 y.o. MRN: [id number]  CC: Headache (pt here today c/o headache since July 3rd when he went to the pharmacy and picked up his rx's. In his medicine bag he brought with him he had 2 Amlodipine and 2 Lisinopril bottles and states he was taking 1 pill out of each bottle in the bag so he was getting double the dose of Amlodipine and Lisinopril x 4 days.)   HPI [name] presents for Severe headache. He says it's been ongoing ever since he started the new medicine. He refers to both the amlodipine and lisinopril. He does not understand the difference between medicines. He just takes 1 of each. He is unable to read and write. Therefore he just takes one pill out of each bottle every day. His history as above is that she started taking 2 of each a day for the last few days, however, he tells me that it started prior to that. The headache is severe. It is frontal radiating to the occiput. It is bilateral it does not affect vision. He denies photophobia. A moderately poor historian. He cannot rate the pain between 110 and he has trouble describing the pain's character.  Depression screen [facility] 2/9 10/23/2016 10/05/2016 09/30/2016  Decreased Interest 0 0 0  Down, Depressed, Hopeless '1 1 1  ' PHQ - 2 Score '1 1 1  ' Altered sleeping - - -  Tired, decreased energy - - -  Change in appetite - - -  Feeling bad or failure about yourself  - - -  Trouble concentrating - - -  Moving slowly or fidgety/restless - - -  Suicidal thoughts - - -  PHQ-9 Score - - -  Difficult doing work/chores - - -    History [name] has a past medical history of Cerebral palsy ([place]); Coronary artery disease; Depression; GERD (gastroesophageal reflux disease); Hypertension; Myocardial infarction [facility]) (2008); Scoliosis; and Seizures ([place]).   He has a past surgical history that includes Coronary angioplasty with stent and Carpal tunnel release (Right, 08/13/2016).    His family history includes Alcohol abuse in his brother; CAD in his brother; CAD (age of onset: 54) in his father; Diabetes in his mother; Heart disease in his father.He reports that he has been smoking Cigarettes.  He has been smoking about 1.00 pack per day. He has never used smokeless tobacco. He reports that he drinks alcohol. He reports that he does not use drugs.    ROS Review of Systems  Constitutional: Negative for chills, diaphoresis and fever.  HENT: Negative for rhinorrhea and sore throat.   Respiratory: Negative for cough and shortness of breath.   Cardiovascular: Negative for chest pain.  Gastrointestinal: Negative for abdominal pain.  Musculoskeletal: Negative for arthralgias and myalgias.  Skin: Negative for rash.  Neurological: Positive for headaches. Negative for dizziness, tremors, syncope, weakness, light-headedness and numbness.    Objective:  BP 135/77   Pulse 91   Temp 99.3 F (37.4 C) (Oral)   Ht 6' (1.829 m)   Wt 220 lb (99.8 kg)   BMI 29.84 kg/m   BP Readings from Last 3 Encounters:  10/23/16 135/77  10/05/16 131/83  09/30/16 129/73    Wt Readings from Last 3 Encounters:  10/23/16 220 lb (99.8 kg)  10/05/16 221 lb (100.2 kg)  09/30/16 218 lb 9.6 oz (99.2 kg)     Physical Exam  Constitutional: He is oriented to  person, place, and time. He appears well-developed and well-nourished. No distress.  HENT:  Head: Normocephalic and atraumatic.  Right Ear: External ear normal.  Left Ear: External ear normal.  Nose: Nose normal.  Mouth/Throat: Oropharynx is clear and moist.  Eyes: Conjunctivae and EOM are normal. Pupils are equal, round, and reactive to light.  Neck: Normal range of motion. Neck supple. No thyromegaly present.  Cardiovascular: Normal rate, regular rhythm and normal heart sounds.   No murmur heard. Pulmonary/Chest: Effort normal and breath sounds normal. No respiratory distress. He has no wheezes. He has no rales.  Abdominal:  Soft. Bowel sounds are normal. He exhibits no distension. There is no tenderness.  Lymphadenopathy:    He has no cervical adenopathy.  Neurological: He is alert and oriented to person, place, and time. He has normal reflexes.  Skin: Skin is warm and dry.  Psychiatric: He has a normal mood and affect. His behavior is normal. Judgment and thought content normal.      Assessment & Plan:   [name] was seen today for headache.  Diagnoses and all orders for this visit:  Essential hypertension  Intractable drug-induced headache, not elsewhere classified  Infantile cerebral palsy ([place])  Illiteracy  Other orders -     butalbital-acetaminophen-caffeine (FIORICET/CODEINE) 50-325-40-30 MG capsule; Take 1 capsule by mouth every 4 (four) hours as needed for headache.       I have discontinued Mr. Anzaldo's amLODipine and cephALEXin. I am also having him start on butalbital-acetaminophen-caffeine. Additionally, I am having him maintain his aspirin EC, PROAIR HFA, simvastatin, Blood Pressure, Aspirin-Salicylamide-Caffeine, lisinopril, fluticasone, fluticasone furoate-vilanterol, omeprazole, ibuprofen, DULoxetine, and HYDROcodone-acetaminophen. We will continue to administer betamethasone acetate-betamethasone sodium phosphate.  Allergies as of 10/23/2016   No Known Allergies     Medication List       Accurate as of 10/23/16  2:29 PM. Always use your most recent med list.          aspirin EC 81 MG tablet Take 1 tablet (81 mg total) by mouth daily.   BC FAST PAIN RELIEF 650-195-33.3 MG Pack Generic drug:  Aspirin-Salicylamide-Caffeine Take 1 packet by mouth every 8 (eight) hours as needed (for pain.).   Blood Pressure Kit Check blood pressure regularly   butalbital-acetaminophen-caffeine 50-325-40-30 MG capsule Commonly known as:  FIORICET/CODEINE Take 1 capsule by mouth every 4 (four) hours as needed for headache.   DULoxetine 30 MG capsule Commonly known as:  CYMBALTA TAKE UP  TO 2 TABLETS ONCE DAILY AS DIRECTED WITH A FULL STOMACH AT SUPPER TIME   fluticasone 50 MCG/ACT nasal spray Commonly known as:  FLONASE Place 2 sprays into both nostrils daily.   fluticasone furoate-vilanterol 100-25 MCG/INH Aepb Commonly known as:  BREO ELLIPTA Inhale 1 puff into the lungs daily.   HYDROcodone-acetaminophen 5-325 MG tablet Commonly known as:  NORCO/VICODIN Take 1 tablet by mouth every 4 (four) hours as needed for moderate pain.   ibuprofen 400 MG tablet Commonly known as:  ADVIL,MOTRIN Take 1 tablet (400 mg total) by mouth every 4 (four) hours as needed.   lisinopril 20 MG tablet Commonly known as:  PRINIVIL,ZESTRIL Take 1 tablet (20 mg total) by mouth daily.   omeprazole 20 MG capsule Commonly known as:  PRILOSEC TAKE (1) CAPSULE DAILY   PROAIR HFA 108 (90 Base) MCG/ACT inhaler Generic drug:  albuterol Inhale 1-2 puffs into the lungs every 6 (six) hours as needed for wheezing or shortness of breath.   simvastatin 20 MG tablet Commonly known as:  ZOCOR TAKE ONE TABLET AT BEDTIME     He is going to discontinue his amlodipine and see if that doesn't help with headache and use the Fioricet as a rescue for now. Since he is illiterate we're going to send in prescriptions to [facility] for him to have pill packs to take on schedule. These packs group all pills together by day and time of day. Follow-up: Return in about 2 weeks (around 11/06/2016).  [name], M.D.

## 2016-11-05 ENCOUNTER — Other Ambulatory Visit: Payer: Self-pay | Admitting: Family Medicine

## 2016-11-05 NOTE — Telephone Encounter (Signed)
Pt request RX for pulse oximeter Pt informed that insurance won't pay for this Pt verbalizes understanding

## 2016-11-06 ENCOUNTER — Encounter: Payer: Self-pay | Admitting: Orthopedic Surgery

## 2016-11-06 ENCOUNTER — Ambulatory Visit: Payer: Medicare Other | Admitting: Family Medicine

## 2016-11-06 ENCOUNTER — Telehealth: Payer: Self-pay | Admitting: Family Medicine

## 2016-11-06 ENCOUNTER — Ambulatory Visit (INDEPENDENT_AMBULATORY_CARE_PROVIDER_SITE_OTHER): Payer: Medicare Other | Admitting: Orthopedic Surgery

## 2016-11-06 VITALS — BP 122/79 | HR 61 | Ht 72.0 in | Wt 214.0 lb

## 2016-11-06 DIAGNOSIS — M19041 Primary osteoarthritis, right hand: Secondary | ICD-10-CM

## 2016-11-06 DIAGNOSIS — Z9889 Other specified postprocedural states: Secondary | ICD-10-CM

## 2016-11-06 DIAGNOSIS — I25119 Atherosclerotic heart disease of native coronary artery with unspecified angina pectoris: Secondary | ICD-10-CM

## 2016-11-06 MED ORDER — PREDNISONE 10 MG (48) PO TBPK: ORAL_TABLET | Freq: Every day | ORAL | 0 refills | Status: DC

## 2016-11-06 NOTE — Progress Notes (Signed)
Follow up   Chief Complaint  Patient presents with  . Wrist Pain    carpal tunnel surgery 08/13/16 right having pain in that wrist and hand    61 year old male had a right carpal tunnel release did well. This was back in April of this year. About 2 weeks ago he started having pain across the palm and pain when picking up objects in the yard.  No trauma  He is on ibuprofen 400 mg when necessary which is not relieving his symptoms at this point   Past Medical History:  Diagnosis Date  . Cerebral palsy ([place])   . Coronary artery disease   . Depression   . GERD (gastroesophageal reflux disease)   . Hypertension   . Myocardial infarction ([place]) 2008  . Scoliosis   . Seizures ([place])    pt's sister states that patinet has had seizures in the past, pt camr for PAT by himself on last visit and she stst "they misunderstood what he was trying to say. Pt saw neurologist in March who did CT and states patient does not have seizures. On no meds, had seizures as child.   Review of Systems  Constitutional: Negative for chills and fever.  Skin: Negative.   Neurological: Negative for tingling and sensory change.   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  Encounter Diagnoses  Name Primary?  . History of carpal tunnel surgery of right wrist   . Inflammation of hand joint, right Yes    Meds ordered this encounter  Medications  . predniSONE (STERAPRED UNI-PAK 48 TAB) 10 MG (48) TBPK tablet    Sig: Take by mouth daily. 10 mg ds dose pack 12 days    Dispense:  48 tablet    Refill:  0    I'm not really sure what's causing his symptoms at this time is had no trauma, perhaps  there is some inflammation and we recommend stopping ibuprofen and taken the dose pack for 12 days then he can resume the ibuprofen come back in a month

## 2016-11-06 NOTE — Patient Instructions (Signed)
Steps to Quit Smoking Smoking tobacco can be bad for your health. It can also affect almost every organ in your body. Smoking puts you and people around you at risk for many serious long-lasting (chronic) diseases. Quitting smoking is hard, but it is one of the best things that you can do for your health. It is never too late to quit. What are the benefits of quitting smoking? When you quit smoking, you lower your risk for getting serious diseases and conditions. They can include:  Lung cancer or lung disease.  Heart disease.  Stroke.  Heart attack.  Not being able to have children (infertility).  Weak bones (osteoporosis) and broken bones (fractures).  If you have coughing, wheezing, and shortness of breath, those symptoms may get better when you quit. You may also get sick less often. If you are pregnant, quitting smoking can help to lower your chances of having a baby of low birth weight. What can I do to help me quit smoking? Talk with your doctor about what can help you quit smoking. Some things you can do (strategies) include:  Quitting smoking totally, instead of slowly cutting back how much you smoke over a period of time.  Going to in-person counseling. You are more likely to quit if you go to many counseling sessions.  Using resources and support systems, such as: ? Online chats with a counselor. ? Phone quitlines. ? Printed self-help materials. ? Support groups or group counseling. ? Text messaging programs. ? Mobile phone apps or applications.  Taking medicines. Some of these medicines may have nicotine in them. If you are pregnant or breastfeeding, do not take any medicines to quit smoking unless your doctor says it is okay. Talk with your doctor about counseling or other things that can help you.  Talk with your doctor about using more than one strategy at the same time, such as taking medicines while you are also going to in-person counseling. This can help make  quitting easier. What things can I do to make it easier to quit? Quitting smoking might feel very hard at first, but there is a lot that you can do to make it easier. Take these steps:  Talk to your family and friends. Ask them to support and encourage you.  Call phone quitlines, reach out to support groups, or work with a counselor.  Ask people who smoke to not smoke around you.  Avoid places that make you want (trigger) to smoke, such as: ? Bars. ? Parties. ? Smoke-break areas at work.  Spend time with people who do not smoke.  Lower the stress in your life. Stress can make you want to smoke. Try these things to help your stress: ? Getting regular exercise. ? Deep-breathing exercises. ? Yoga. ? Meditating. ? Doing a body scan. To do this, close your eyes, focus on one area of your body at a time from head to toe, and notice which parts of your body are tense. Try to relax the muscles in those areas.  Download or buy apps on your mobile phone or tablet that can help you stick to your quit plan. There are many free apps, such as QuitGuide from the CDC (Centers for Disease Control and Prevention). You can find more support from smokefree.gov and other websites.  This information is not intended to replace advice given to you by your health care provider. Make sure you discuss any questions you have with your health care provider. Document Released: 01/31/2009 Document   Revised: 12/03/2015 Document Reviewed: 08/21/2014 Elsevier Interactive Patient Education  2018 Elsevier Inc.  

## 2016-11-13 NOTE — Addendum Note (Signed)
Addended by: Arther Abbott E on: 11/13/2016 01:30 PM   Modules accepted: Level of Service

## 2016-11-16 ENCOUNTER — Ambulatory Visit: Payer: Medicare Other | Admitting: Family Medicine

## 2016-11-17 ENCOUNTER — Encounter: Payer: Self-pay | Admitting: Family Medicine

## 2016-11-26 NOTE — Telephone Encounter (Signed)
Left message for patient to call but we have not had any response.

## 2016-12-09 ENCOUNTER — Telehealth: Payer: Self-pay | Admitting: Family Medicine

## 2016-12-09 NOTE — Telephone Encounter (Signed)
Patient aware he has been dismissed and he will need to establish care at another facility.

## 2016-12-14 ENCOUNTER — Ambulatory Visit: Payer: Self-pay | Admitting: Orthopedic Surgery

## 2016-12-14 ENCOUNTER — Encounter: Payer: Self-pay | Admitting: Orthopedic Surgery

## 2016-12-16 ENCOUNTER — Telehealth: Payer: Self-pay | Admitting: Family Medicine

## 2016-12-16 NOTE — Telephone Encounter (Signed)
Patient was dismissed for No Shows and his sister wants to know if you will take patient back. He is mentally and physically challenged and didn't remember getting notifications of appointments. He apologizes for missing appts and he needs our help. His transportation is a moped and needs a provider close by. Please advise.

## 2017-01-18 ENCOUNTER — Other Ambulatory Visit: Payer: Self-pay | Admitting: Family Medicine

## 2017-01-18 DIAGNOSIS — I10 Essential (primary) hypertension: Secondary | ICD-10-CM

## 2017-01-20 ENCOUNTER — Telehealth: Payer: Self-pay | Admitting: Family Medicine

## 2017-01-20 DIAGNOSIS — J440 Chronic obstructive pulmonary disease with acute lower respiratory infection: Secondary | ICD-10-CM

## 2017-01-20 DIAGNOSIS — J441 Chronic obstructive pulmonary disease with (acute) exacerbation: Secondary | ICD-10-CM

## 2017-01-20 DIAGNOSIS — J209 Acute bronchitis, unspecified: Secondary | ICD-10-CM

## 2017-01-20 DIAGNOSIS — E7800 Pure hypercholesterolemia, unspecified: Secondary | ICD-10-CM

## 2017-01-20 DIAGNOSIS — I25119 Atherosclerotic heart disease of native coronary artery with unspecified angina pectoris: Secondary | ICD-10-CM

## 2017-01-20 DIAGNOSIS — I10 Essential (primary) hypertension: Secondary | ICD-10-CM

## 2017-01-20 NOTE — Telephone Encounter (Signed)
Patient was dismissed at end of July but needs refills on basic medications . Please advise if this will be considered.  Patient has mental disabilities and sister is asking for the refills for him.

## 2017-01-21 MED ORDER — SIMVASTATIN 20 MG PO TABS: 20.0000 mg | ORAL_TABLET | Freq: Every day | ORAL | 1 refills | Status: DC

## 2017-01-21 MED ORDER — FLUTICASONE PROPIONATE 50 MCG/ACT NA SUSP: 2.0000 | Freq: Every day | NASAL | 5 refills | Status: DC

## 2017-01-21 MED ORDER — FLUTICASONE FUROATE-VILANTEROL 100-25 MCG/INH IN AEPB: 1.0000 | INHALATION_SPRAY | Freq: Every day | RESPIRATORY_TRACT | 5 refills | Status: DC

## 2017-01-21 MED ORDER — LISINOPRIL 20 MG PO TABS: 20.0000 mg | ORAL_TABLET | Freq: Every day | ORAL | 1 refills | Status: DC

## 2017-01-21 MED ORDER — DULOXETINE HCL 30 MG PO CPEP: ORAL_CAPSULE | ORAL | 5 refills | Status: DC

## 2017-01-21 MED ORDER — OMEPRAZOLE 20 MG PO CPDR
DELAYED_RELEASE_CAPSULE | ORAL | 5 refills | Status: DC
Start: 2017-01-21 — End: 2017-02-09

## 2017-01-21 NOTE — Telephone Encounter (Signed)
Okay to refill all meds for 6 mos 

## 2017-01-21 NOTE — Telephone Encounter (Signed)
Sister aware, 6 months called in per dr [employer]

## 2017-02-04 ENCOUNTER — Telehealth: Payer: Self-pay | Admitting: Family Medicine

## 2017-02-04 NOTE — Telephone Encounter (Signed)
Informed sister form was faxed on 02/01/17

## 2017-02-04 NOTE — Telephone Encounter (Signed)
Pt's sister is aware Tye [place] working on the form and will contact her.

## 2017-02-09 ENCOUNTER — Telehealth: Payer: Self-pay | Admitting: Family Medicine

## 2017-02-09 ENCOUNTER — Ambulatory Visit (INDEPENDENT_AMBULATORY_CARE_PROVIDER_SITE_OTHER): Payer: Medicare Other | Admitting: Family Medicine

## 2017-02-09 ENCOUNTER — Ambulatory Visit (INDEPENDENT_AMBULATORY_CARE_PROVIDER_SITE_OTHER): Payer: Medicare Other

## 2017-02-09 ENCOUNTER — Encounter: Payer: Self-pay | Admitting: Family Medicine

## 2017-02-09 VITALS — BP 121/75 | HR 81 | Temp 97.8°F | Ht 72.0 in | Wt 219.0 lb

## 2017-02-09 DIAGNOSIS — Z23 Encounter for immunization: Secondary | ICD-10-CM

## 2017-02-09 DIAGNOSIS — I10 Essential (primary) hypertension: Secondary | ICD-10-CM | POA: Diagnosis not present

## 2017-02-09 DIAGNOSIS — R05 Cough: Secondary | ICD-10-CM | POA: Diagnosis not present

## 2017-02-09 DIAGNOSIS — I25119 Atherosclerotic heart disease of native coronary artery with unspecified angina pectoris: Secondary | ICD-10-CM

## 2017-02-09 DIAGNOSIS — R109 Unspecified abdominal pain: Secondary | ICD-10-CM

## 2017-02-09 DIAGNOSIS — G809 Cerebral palsy, unspecified: Secondary | ICD-10-CM

## 2017-02-09 DIAGNOSIS — R059 Cough, unspecified: Secondary | ICD-10-CM

## 2017-02-09 MED ORDER — TAMSULOSIN HCL 0.4 MG PO CAPS: 0.8000 mg | ORAL_CAPSULE | Freq: Every day | ORAL | 3 refills | Status: DC

## 2017-02-09 MED ORDER — PANTOPRAZOLE SODIUM 40 MG PO TBEC: 40.0000 mg | DELAYED_RELEASE_TABLET | Freq: Two times a day (BID) | ORAL | 3 refills | Status: DC

## 2017-02-09 NOTE — Progress Notes (Signed)
Subjective:  Patient ID: [name], male    DOB: 1956/03/28  Age: 61 y.o. MRN: [id number]  CC: Follow-up (pt here today for routine follow up of his chronic medical conditions and needs face to face for home health and has a form that needs to be filled out)   HPI [name] presents for Reestablishment. He had been dismissed from the practice due to multiple no shows. His sister intervened and explained to the office that he couldn't read the notes that we sent him regarding his appointments.  he had gotten some phone calls as well. However, he was accepted back to the practice based on his history of cerebral palsy. I felt there was a need to provide continuity but also a recommendation that we could not provide continuity if he was not available for his appointments. With his sister's intervention hopefully if we keep her informed of his appointments she tells [place] that she can make sure he gets here.  Patient has some sharp abdominal pain ongoing for several months. This is intermittent. It occurs in the mornings she'll get up and take his medication and that's when the pain starts. It's unclear whether it is before or after he eats breakfast. But he is concerned that this medicine may be the cause. He points to the periumbilical region and slightly upward into the epigastrium. It is not related to any particular food. Patient is unable to describe the pain any further. It is apparently moderate when it occurs.  The patient also has been having some nagging cough recently. He is unclear with regard to how long this has been going on.. There has been no fever no chills. Some shortness of breath with the cough but not with rest or exertion. It is nonproductive but he does feel some phlegm going down his throat sometimes.   he also is living alone and having problems with his ADLs. His sister says that he smells of urine at times. The patient describes not being able to get to the bathroom  when he has to go to urinate. This happens fairly frequently. He has to get up at night to go to the bathroom to urinate 1-3 times a night.  The patient contributed with his history and review of systems. However, the majority was given by his sister who also completed his pH Q9 screening and gave him a score of 17. Patient is somewhat down at times. It is unclear if he is still taking his Cymbalta. His medications are not with him. His sister tells me that her daughter can work with him to set out his medications on a daily basis.   Depression screen [facility] 2/9 02/09/2017 10/23/2016 10/05/2016  Decreased Interest 0 0 0  Down, Depressed, Hopeless 0 1 1  PHQ - 2 Score 0 1 1  Altered sleeping - - -  Tired, decreased energy - - -  Change in appetite - - -  Feeling bad or failure about yourself  - - -  Trouble concentrating - - -  Moving slowly or fidgety/restless - - -  Suicidal thoughts - - -  PHQ-9 Score - - -  Difficult doing work/chores - - -    History Gaspar has a past medical history of Cerebral palsy ([place]); Coronary artery disease; Depression; GERD (gastroesophageal reflux disease); Hypertension; Myocardial infarction [facility]) (2008); Scoliosis; and Seizures ([place]).   He has a past surgical history that includes Coronary angioplasty with stent and Carpal tunnel release (  Right, 08/13/2016).   His family history includes Alcohol abuse in his brother; CAD in his brother; CAD (age of onset: 41) in his father; Diabetes in his mother; Heart disease in his father.He reports that he has been smoking Cigarettes.  He has been smoking about 1.00 pack per day. He has never used smokeless tobacco. He reports that he drinks alcohol. He reports that he does not use drugs.    ROS Review of Systems  Constitutional: Negative for chills, diaphoresis, fever and unexpected weight change.  HENT: Negative for congestion, hearing loss, rhinorrhea and sore throat.   Eyes: Negative for visual disturbance.    Respiratory: Positive for cough. Negative for shortness of breath.   Cardiovascular: Negative for chest pain.  Gastrointestinal: Negative for abdominal pain, constipation and diarrhea.  Genitourinary: Positive for frequency and urgency. Negative for decreased urine volume, dysuria, flank pain and hematuria.  Musculoskeletal: Negative for arthralgias and joint swelling.  Skin: Negative for rash.  Neurological: Negative for dizziness and headaches.  Psychiatric/Behavioral: Negative for dysphoric mood and sleep disturbance.    Objective:  BP 121/75   Pulse 81   Temp 97.8 F (36.6 C) (Oral)   Ht 6' (1.829 m)   Wt 219 lb (99.3 kg)   BMI 29.70 kg/m   BP Readings from Last 3 Encounters:  02/09/17 121/75  11/06/16 122/79  10/23/16 135/77    Wt Readings from Last 3 Encounters:  02/09/17 219 lb (99.3 kg)  11/06/16 214 lb (97.1 kg)  10/23/16 220 lb (99.8 kg)     Physical Exam  Constitutional: He is oriented to person, place, and time. He appears well-developed and well-nourished. No distress.  HENT:  Head: Normocephalic and atraumatic.  Right Ear: External ear normal.  Left Ear: External ear normal.  Nose: Nose normal.  Mouth/Throat: Oropharynx is clear and moist.  Eyes: Pupils are equal, round, and reactive to light. Conjunctivae and EOM are normal.  Neck: Normal range of motion. Neck supple. No thyromegaly present.  Cardiovascular: Normal rate, regular rhythm and normal heart sounds.   No murmur heard. Pulmonary/Chest: Effort normal. No respiratory distress. He has wheezes (few, scattered). He has no rales.  Abdominal: Soft. Bowel sounds are normal. He exhibits no distension. There is tenderness (mild at periumbilical area.).  Lymphadenopathy:    He has no cervical adenopathy.  Neurological: He is alert and oriented to person, place, and time. He has normal reflexes.  Skin: Skin is warm and dry.  Psychiatric: He has a normal mood and affect. His behavior is normal.  Judgment and thought content normal.      Assessment & Plan:   Jhony was seen today for follow-up.  Diagnoses and all orders for this visit:  Essential hypertension -     CBC with Differential/Platelet -     CMP14+EGFR  Stomach pain -     DG Abd 2 Views; Future  Cough -     DG Chest 2 View; Future  Need for immunization against influenza -     Flu Vaccine QUAD 36+ mos IM  Cerebral palsy, unspecified type ([place]) -     Face-to-face encounter (required for Medicare/Medicaid patients)  Other orders -     tamsulosin (FLOMAX) 0.4 MG CAPS capsule; Take 2 capsules (0.8 mg total) by mouth daily. -     pantoprazole (PROTONIX) 40 MG tablet; Take 1 tablet (40 mg total) by mouth 2 (two) times daily.       I have discontinued Mr. Arscott Blood Pressure, Aspirin-Salicylamide-Caffeine,  HYDROcodone-acetaminophen, butalbital-acetaminophen-caffeine, predniSONE, omeprazole, and lisinopril. I am also having him start on tamsulosin and pantoprazole. Additionally, I am having him maintain his aspirin EC, PROAIR HFA, ibuprofen, simvastatin, fluticasone furoate-vilanterol, fluticasone, and DULoxetine. We will continue to administer betamethasone acetate-betamethasone sodium phosphate.  Allergies as of 02/09/2017   No Known Allergies     Medication List       Accurate as of 02/09/17  5:45 PM. Always use your most recent med list.          aspirin EC 81 MG tablet Take 1 tablet (81 mg total) by mouth daily.   DULoxetine 30 MG capsule Commonly known as:  CYMBALTA TAKE UP TO 2 TABLETS ONCE DAILY AS DIRECTED WITH A FULL STOMACH AT SUPPER TIME   fluticasone 50 MCG/ACT nasal spray Commonly known as:  FLONASE Place 2 sprays into both nostrils daily.   fluticasone furoate-vilanterol 100-25 MCG/INH Aepb Commonly known as:  BREO ELLIPTA Inhale 1 puff into the lungs daily.   ibuprofen 400 MG tablet Commonly known as:  ADVIL,MOTRIN Take 1 tablet (400 mg total) by mouth every 4 (four)  hours as needed.   pantoprazole 40 MG tablet Commonly known as:  PROTONIX Take 1 tablet (40 mg total) by mouth 2 (two) times daily.   PROAIR HFA 108 (90 Base) MCG/ACT inhaler Generic drug:  albuterol Inhale 1-2 puffs into the lungs every 6 (six) hours as needed for wheezing or shortness of breath.   simvastatin 20 MG tablet Commonly known as:  ZOCOR Take 1 tablet (20 mg total) by mouth at bedtime.   tamsulosin 0.4 MG Caps capsule Commonly known as:  FLOMAX Take 2 capsules (0.8 mg total) by mouth daily.        Follow-up: Return in about 1 month (around 03/12/2017).  [name], M.D.

## 2017-02-09 NOTE — Telephone Encounter (Signed)
Pt aware of Chest xray.

## 2017-02-10 ENCOUNTER — Other Ambulatory Visit: Payer: Self-pay | Admitting: Family Medicine

## 2017-02-10 LAB — CMP14+EGFR
ALT: 11 IU/L (ref 0–44)
AST: 13 IU/L (ref 0–40)
Albumin/Globulin Ratio: 1.5 (ref 1.2–2.2)
Albumin: 4.4 g/dL (ref 3.5–5.5)
Alkaline Phosphatase: 64 IU/L (ref 39–117)
BUN/Creatinine Ratio: 17 (ref 9–20)
BUN: 12 mg/dL (ref 6–24)
Bilirubin Total: 0.7 mg/dL (ref 0.0–1.2)
CALCIUM: 9.4 mg/dL (ref 8.7–10.2)
CO2: 29 mmol/L (ref 20–29)
CREATININE: 0.7 mg/dL — AB (ref 0.76–1.27)
Chloride: 97 mmol/L (ref 96–106)
GFR calc Af Amer: 119 mL/min/{1.73_m2} (ref >59)
GFR, EST NON AFRICAN AMERICAN: 103 mL/min/{1.73_m2} (ref >59)
GLOBULIN, TOTAL: 2.9 g/dL (ref 1.5–4.5)
GLUCOSE: 82 mg/dL (ref 65–99)
Potassium: 3.9 mmol/L (ref 3.5–5.2)
Sodium: 142 mmol/L (ref 134–144)
Total Protein: 7.3 g/dL (ref 6.0–8.5)

## 2017-02-10 LAB — CBC WITH DIFFERENTIAL/PLATELET
BASOS ABS: 0.1 10*3/uL (ref 0.0–0.2)
Basos: 0 %
EOS (ABSOLUTE): 1.1 10*3/uL — ABNORMAL HIGH (ref 0.0–0.4)
Eos: 10 %
HEMATOCRIT: 44.9 % (ref 37.5–51.0)
Hemoglobin: 16 g/dL (ref 13.0–17.7)
IMMATURE GRANULOCYTES: 0 %
Immature Grans (Abs): 0 10*3/uL (ref 0.0–0.1)
LYMPHS: 23 %
Lymphocytes Absolute: 2.5 10*3/uL (ref 0.7–3.1)
MCH: 35.2 pg — ABNORMAL HIGH (ref 26.6–33.0)
MCHC: 35.6 g/dL (ref 31.5–35.7)
MCV: 99 fL — ABNORMAL HIGH (ref 79–97)
MONOCYTES: 5 %
Monocytes Absolute: 0.5 10*3/uL (ref 0.1–0.9)
Neutrophils Absolute: 6.9 10*3/uL (ref 1.4–7.0)
Neutrophils: 62 %
PLATELETS: 217 10*3/uL (ref 150–379)
RBC: 4.54 x10E6/uL (ref 4.14–5.80)
RDW: 13.8 % (ref 12.3–15.4)
WBC: 11.1 10*3/uL — AB (ref 3.4–10.8)

## 2017-02-23 ENCOUNTER — Encounter: Payer: Self-pay | Admitting: Family Medicine

## 2017-02-23 ENCOUNTER — Ambulatory Visit (INDEPENDENT_AMBULATORY_CARE_PROVIDER_SITE_OTHER): Payer: Medicare Other | Admitting: Family Medicine

## 2017-02-23 VITALS — BP 120/75 | HR 86 | Temp 97.0°F | Ht 72.0 in | Wt 224.0 lb

## 2017-02-23 DIAGNOSIS — I25119 Atherosclerotic heart disease of native coronary artery with unspecified angina pectoris: Secondary | ICD-10-CM

## 2017-02-23 DIAGNOSIS — E7800 Pure hypercholesterolemia, unspecified: Secondary | ICD-10-CM

## 2017-02-23 DIAGNOSIS — R1033 Periumbilical pain: Secondary | ICD-10-CM

## 2017-02-23 MED ORDER — DULOXETINE HCL 30 MG PO CPEP: 60.0000 mg | ORAL_CAPSULE | Freq: Every day | ORAL | 5 refills | Status: DC

## 2017-02-23 MED ORDER — TAMSULOSIN HCL 0.4 MG PO CAPS: 0.8000 mg | ORAL_CAPSULE | Freq: Every day | ORAL | 5 refills | Status: DC

## 2017-02-23 MED ORDER — PANTOPRAZOLE SODIUM 40 MG PO TBEC: 40.0000 mg | DELAYED_RELEASE_TABLET | Freq: Two times a day (BID) | ORAL | 5 refills | Status: DC

## 2017-02-23 MED ORDER — SIMVASTATIN 20 MG PO TABS: 20.0000 mg | ORAL_TABLET | Freq: Every day | ORAL | 1 refills | Status: DC

## 2017-02-23 NOTE — Progress Notes (Signed)
Subjective:  Patient ID: [name], male    DOB: Sep 19, 1955  Age: 61 y.o. MRN: [id number]  CC: Follow-up (pt here today for 2 week recheck)   HPI [name] presents for continued abdominal pain.  It is essentially unchanged.  However he describes it now as when he lays down at night he feels like he has been punched in the belly.  During the day that feels like a needle sticking in it intermittently.  Both sensations occur in the same place just to the inferior aspect of the epigastrium and just above the periumbilical region at the midline.  Home health came out for the first time yesterday.  They are looking at getting a home health aide.  His niece may be hired for this duty.  They are looking at having his medications packaged as well in daily.  He is looking at a fee of $15 per month for the service. He isn't sure he can afford this. HE is accompanied by his sister eho gives details of history. Both deny sx of depression. Cough has resolved.  Depression screen [facility] 2/9 02/23/2017 02/09/2017 10/23/2016  Decreased Interest 0 0 0  Down, Depressed, Hopeless 0 0 1  PHQ - 2 Score 0 0 1  Altered sleeping - - -  Tired, decreased energy - - -  Change in appetite - - -  Feeling bad or failure about yourself  - - -  Trouble concentrating - - -  Moving slowly or fidgety/restless - - -  Suicidal thoughts - - -  PHQ-9 Score - - -  Difficult doing work/chores - - -    History Zoe has a past medical history of Cerebral palsy ([place]), Coronary artery disease, Depression, GERD (gastroesophageal reflux disease), Hypertension, Myocardial infarction ([place]) (2008), Scoliosis, and Seizures ([place]).   He has a past surgical history that includes Coronary angioplasty with stent and CARPAL TUNNEL RELEASE (Right, 08/13/2016).   His family history includes Alcohol abuse in his brother; CAD in his brother; CAD (age of onset: 97) in his father; Diabetes in his mother; Heart disease in his father.He reports  that he has been smoking cigarettes.  He has been smoking about 1.00 pack per day. he has never used smokeless tobacco. He reports that he drinks alcohol. He reports that he does not use drugs.    ROS Review of Systems  Constitutional: Negative for chills, diaphoresis, fever and unexpected weight change.  HENT: Negative for rhinorrhea and trouble swallowing.   Respiratory: Negative for cough, chest tightness and shortness of breath.   Cardiovascular: Negative for chest pain.  Gastrointestinal: Positive for abdominal pain. Negative for abdominal distention, blood in stool, constipation, diarrhea, nausea, rectal pain and vomiting.  Genitourinary: Negative for dysuria, flank pain and hematuria.  Musculoskeletal: Negative for arthralgias and joint swelling.  Skin: Negative for rash.  Neurological: Negative for syncope and headaches.    Objective:  BP 120/75   Pulse 86   Temp (!) 97 F (36.1 C) (Oral)   Ht 6' (1.829 m)   Wt 224 lb (101.6 kg)   BMI 30.38 kg/m   BP Readings from Last 3 Encounters:  02/23/17 120/75  02/09/17 121/75  11/06/16 122/79    Wt Readings from Last 3 Encounters:  02/23/17 224 lb (101.6 kg)  02/09/17 219 lb (99.3 kg)  11/06/16 214 lb (97.1 kg)     Physical Exam  Constitutional: He is oriented to person, place, and time. He appears well-developed and well-nourished.  No distress.  HENT:  Head: Normocephalic and atraumatic.  Right Ear: External ear normal.  Left Ear: External ear normal.  Nose: Nose normal.  Mouth/Throat: Oropharynx is clear and moist.  Eyes: Conjunctivae and EOM are normal. Pupils are equal, round, and reactive to light.  Neck: Normal range of motion. Neck supple. No thyromegaly present.  Cardiovascular: Normal rate, regular rhythm and normal heart sounds.  No murmur heard. Pulmonary/Chest: Effort normal and breath sounds normal. No respiratory distress. He has no wheezes. He has no rales.  Abdominal: Soft. Bowel sounds are normal.  He exhibits distension. There is tenderness (moderate 6 cm above umbilicus at midline, reproduceable).  Lymphadenopathy:    He has no cervical adenopathy.  Neurological: He is alert and oriented to person, place, and time. He has normal reflexes.  Skin: Skin is warm and dry.  Psychiatric: He has a normal mood and affect. His behavior is normal. Thought content normal. His speech is not slurred (mild- baseline). Cognition and memory are impaired (simplistic).      Assessment & Plan:   Ulysse was seen today for follow-up.  Diagnoses and all orders for this visit:  Intermittent periumbilical abdominal pain -     Ambulatory referral to Gastroenterology  Atherosclerosis of native coronary artery of native heart with angina pectoris ([place])  HYPERCHOLESTEROLEMIA  Other orders -     Discontinue: DULoxetine (CYMBALTA) 30 MG capsule; Take 2 capsules (60 mg total) daily by mouth. TAKE UP TO 2 TABLETS ONCE DAILY AS DIRECTED WITH A FULL STOMACH AT SUPPER TIME -     DULoxetine (CYMBALTA) 30 MG capsule; Take 2 capsules (60 mg total) daily by mouth. WITH A FULL STOMACH AT SUPPER TIME       I have discontinued [place] DULoxetine. I have also changed his DULoxetine. Additionally, I am having him maintain his aspirin EC, PROAIR HFA, ibuprofen, simvastatin, fluticasone furoate-vilanterol, fluticasone, tamsulosin, and pantoprazole.  Allergies as of 02/23/2017   No Known Allergies     Medication List        Accurate as of 02/23/17 11:36 AM. Always use your most recent med list.          aspirin EC 81 MG tablet Take 1 tablet (81 mg total) by mouth daily.   DULoxetine 30 MG capsule Commonly known as:  CYMBALTA Take 2 capsules (60 mg total) daily by mouth. WITH A FULL STOMACH AT SUPPER TIME   fluticasone 50 MCG/ACT nasal spray Commonly known as:  FLONASE Place 2 sprays into both nostrils daily.   fluticasone furoate-vilanterol 100-25 MCG/INH Aepb Commonly known as:  BREO  ELLIPTA Inhale 1 puff into the lungs daily.   ibuprofen 400 MG tablet Commonly known as:  ADVIL,MOTRIN Take 1 tablet (400 mg total) by mouth every 4 (four) hours as needed.   pantoprazole 40 MG tablet Commonly known as:  PROTONIX Take 1 tablet (40 mg total) by mouth 2 (two) times daily.   PROAIR HFA 108 (90 Base) MCG/ACT inhaler Generic drug:  albuterol Inhale 1-2 puffs into the lungs every 6 (six) hours as needed for wheezing or shortness of breath.   simvastatin 20 MG tablet Commonly known as:  ZOCOR Take 1 tablet (20 mg total) by mouth at bedtime.   tamsulosin 0.4 MG Caps capsule Commonly known as:  FLOMAX Take 2 capsules (0.8 mg total) by mouth daily.        Follow-up: Return in about 2 months (around 04/25/2017).  [name], M.D.

## 2017-03-15 ENCOUNTER — Encounter (INDEPENDENT_AMBULATORY_CARE_PROVIDER_SITE_OTHER): Payer: Self-pay | Admitting: Internal Medicine

## 2017-03-15 ENCOUNTER — Ambulatory Visit (INDEPENDENT_AMBULATORY_CARE_PROVIDER_SITE_OTHER): Payer: Medicare Other | Admitting: Internal Medicine

## 2017-03-15 DIAGNOSIS — I25119 Atherosclerotic heart disease of native coronary artery with unspecified angina pectoris: Secondary | ICD-10-CM | POA: Diagnosis not present

## 2017-03-15 DIAGNOSIS — R1010 Upper abdominal pain, unspecified: Secondary | ICD-10-CM

## 2017-03-15 NOTE — Patient Instructions (Signed)
US abdomen 

## 2017-03-15 NOTE — Progress Notes (Signed)
   Subjective:    Patient ID: [name], male    DOB: Aug 05, 1955, 61 y.o.   MRN: [id number]  HPI Referred by Dr Livia Snellen for intermittent abdominal pain.  Takes Motrin rarely.  Presents with pain just above his umbilicus. Pain is worse when he lies down.  States it feels like someone is sticking him with needles when he is sitting up.  Denies any abdominal pain, diarrhea, nausea or vomiting . No fever. Denies any trauma to his abdomen.  Has had pain for about 2 weeks or longer. BMs are moving okay. No melena or BRRB. Appetite is not good. No weight loss.  Denies and GERD.    Last colonoscopy in 2016 by Dr. Britta Mccreedy for rectal Bleeding. Three polyps removed.  Biopsy:  All were tubular adenomas.    CBC    Component Value Date/Time   WBC 11.1 (H) 02/09/2017 1044   WBC 10.9 (H) 06/09/2016 1544   RBC 4.54 02/09/2017 1044   RBC 4.26 06/09/2016 1544   HGB 16.0 02/09/2017 1044   HCT 44.9 02/09/2017 1044   PLT 217 02/09/2017 1044   MCV 99 (H) 02/09/2017 1044   MCH 35.2 (H) 02/09/2017 1044   MCH 36.2 (H) 06/09/2016 1544   MCHC 35.6 02/09/2017 1044   MCHC 36.8 (H) 06/09/2016 1544   RDW 13.8 02/09/2017 1044   LYMPHSABS 2.5 02/09/2017 1044   MONOABS 0.8 06/02/2016 1341   EOSABS 1.1 (H) 02/09/2017 1044   BASOSABS 0.1 02/09/2017 1044    Review of Systems     Objective:   Physical Exam Blood pressure 120/84, pulse 72, temperature 98.1 F (36.7 C), height 6' (1.829 m), weight 221 lb 11.2 oz (100.6 kg). Alert and oriented. Skin warm and dry. Oral mucosa is moist.   . Sclera anicteric, conjunctivae is pink. Thyroid not enlarged. No cervical lymphadenopathy. Lungs clear. Heart regular rate and rhythm.  Abdomen is soft. Bowel sounds are positive. No hepatomegaly. No abdominal masses felt. Tenderness just above umbilicus.   No edema to lower extremities          Assessment & Plan:  Periumbilical pain. Am going to get an US abdomen. Further recommendations to follow.

## 2017-03-22 ENCOUNTER — Ambulatory Visit (HOSPITAL_COMMUNITY)
Admission: RE | Admit: 2017-03-22 | Discharge: 2017-03-22 | Disposition: A | Discharge status: Home / Self Care | Payer: Medicare Other | Source: Ambulatory Visit | Attending: Internal Medicine | Admitting: Internal Medicine

## 2017-03-22 DIAGNOSIS — R1010 Upper abdominal pain, unspecified: Secondary | ICD-10-CM | POA: Insufficient documentation

## 2017-03-22 DIAGNOSIS — R109 Unspecified abdominal pain: Secondary | ICD-10-CM | POA: Diagnosis not present

## 2017-04-06 ENCOUNTER — Other Ambulatory Visit: Payer: Self-pay | Admitting: Family Medicine

## 2017-04-06 ENCOUNTER — Other Ambulatory Visit: Payer: Self-pay | Admitting: Family

## 2017-04-06 DIAGNOSIS — J440 Chronic obstructive pulmonary disease with acute lower respiratory infection: Principal | ICD-10-CM

## 2017-04-06 DIAGNOSIS — I10 Essential (primary) hypertension: Secondary | ICD-10-CM

## 2017-04-06 DIAGNOSIS — J209 Acute bronchitis, unspecified: Secondary | ICD-10-CM

## 2017-04-07 ENCOUNTER — Other Ambulatory Visit: Payer: Self-pay | Admitting: Family Medicine

## 2017-04-07 DIAGNOSIS — J441 Chronic obstructive pulmonary disease with (acute) exacerbation: Secondary | ICD-10-CM

## 2017-04-26 ENCOUNTER — Encounter: Payer: Self-pay | Admitting: Family Medicine

## 2017-04-26 ENCOUNTER — Ambulatory Visit (INDEPENDENT_AMBULATORY_CARE_PROVIDER_SITE_OTHER): Payer: Medicare Other | Admitting: Family Medicine

## 2017-04-26 VITALS — BP 115/75 | HR 84 | Ht 72.0 in | Wt 234.0 lb

## 2017-04-26 DIAGNOSIS — I10 Essential (primary) hypertension: Secondary | ICD-10-CM

## 2017-04-26 DIAGNOSIS — E7800 Pure hypercholesterolemia, unspecified: Secondary | ICD-10-CM | POA: Diagnosis not present

## 2017-04-26 MED ORDER — TAMSULOSIN HCL 0.4 MG PO CAPS: 0.8000 mg | ORAL_CAPSULE | Freq: Every day | ORAL | 1 refills | Status: DC

## 2017-04-26 MED ORDER — DULOXETINE HCL 30 MG PO CPEP: 60.0000 mg | ORAL_CAPSULE | Freq: Every day | ORAL | 1 refills | Status: DC

## 2017-04-26 MED ORDER — PANTOPRAZOLE SODIUM 40 MG PO TBEC: 40.0000 mg | DELAYED_RELEASE_TABLET | Freq: Two times a day (BID) | ORAL | 1 refills | Status: DC

## 2017-04-26 NOTE — Progress Notes (Signed)
Subjective:  Patient ID: [name], male    DOB: 1956/01/23  Age: 62 y.o. MRN: [id number]  CC: Hypertension (pt here today for routine follow up of his chronic medical conditions)   HPI [name] presents for follow-up of hypertension. Patient has no history of headache chest pain or shortness of breath or recent cough. Patient also denies symptoms of TIA such as numbness weakness lateralizing. Patient checks  blood pressure at home and has not had any elevated readings recently. Patient denies side effects from his medication. States taking it regularly. Patient in for follow-up of elevated cholesterol. Doing well without complaints on current medication. Denies side effects of statin including myalgia and arthralgia and nausea. Also in today for liver function testing. Currently no chest pain, shortness of breath or other cardiovascular related symptoms noted.   Depression screen [facility] 2/9 04/26/2017 02/23/2017 02/09/2017  Decreased Interest 0 0 0  Down, Depressed, Hopeless 0 0 0  PHQ - 2 Score 0 0 0  Altered sleeping - - -  Tired, decreased energy - - -  Change in appetite - - -  Feeling bad or failure about yourself  - - -  Trouble concentrating - - -  Moving slowly or fidgety/restless - - -  Suicidal thoughts - - -  PHQ-9 Score - - -  Difficult doing work/chores - - -    History [name] has a past medical history of Cerebral palsy ([place]), Coronary artery disease, Depression, GERD (gastroesophageal reflux disease), Hypertension, Myocardial infarction ([place]) (2008), Scoliosis, and Seizures ([place]).   He has a past surgical history that includes Coronary angioplasty with stent and Carpal tunnel release (Right, 08/13/2016).   His family history includes Alcohol abuse in his brother; CAD in his brother; CAD (age of onset: 6) in his father; Diabetes in his mother; Heart disease in his father.He reports that he has been smoking cigarettes.  He has been smoking about 1.00 pack per day.  he has never used smokeless tobacco. He reports that he drinks alcohol. He reports that he does not use drugs.    ROS Review of Systems  Constitutional: Negative for chills, diaphoresis, fever and unexpected weight change.  HENT: Negative for congestion, hearing loss, rhinorrhea and sore throat.   Eyes: Negative for visual disturbance.  Respiratory: Negative for cough and shortness of breath.   Cardiovascular: Negative for chest pain.  Gastrointestinal: Negative for abdominal pain, constipation and diarrhea.  Genitourinary: Negative for dysuria and flank pain.  Musculoskeletal: Negative for arthralgias and joint swelling.  Skin: Negative for rash.  Neurological: Negative for dizziness and headaches.  Psychiatric/Behavioral: Negative for dysphoric mood and sleep disturbance.    Objective:  BP 115/75   Pulse 84   Ht 6' (1.829 m)   Wt 234 lb (106.1 kg)   BMI 31.74 kg/m   BP Readings from Last 3 Encounters:  04/26/17 115/75  03/15/17 120/84  02/23/17 120/75    Wt Readings from Last 3 Encounters:  04/26/17 234 lb (106.1 kg)  03/15/17 221 lb 11.2 oz (100.6 kg)  02/23/17 224 lb (101.6 kg)     Physical Exam  Constitutional: He is oriented to person, place, and time. He appears well-developed and well-nourished. No distress.  HENT:  Head: Normocephalic and atraumatic.  Right Ear: External ear normal.  Left Ear: External ear normal.  Nose: Nose normal.  Mouth/Throat: Oropharynx is clear and moist.  Eyes: Conjunctivae and EOM are normal. Pupils are equal, round, and reactive to light.  Neck: Normal range of motion. Neck supple. No thyromegaly present.  Cardiovascular: Normal rate, regular rhythm and normal heart sounds.  No murmur heard. Pulmonary/Chest: Effort normal and breath sounds normal. No respiratory distress. He has no wheezes. He has no rales.  Abdominal: Soft. Bowel sounds are normal. He exhibits no distension. There is no tenderness.  Lymphadenopathy:    He has  no cervical adenopathy.  Neurological: He is alert and oriented to person, place, and time. He has normal reflexes.  Skin: Skin is warm and dry.  Psychiatric: He has a normal mood and affect. His behavior is normal. Judgment and thought content normal.      Assessment & Plan:   [name] was seen today for hypertension.  Diagnoses and all orders for this visit:  Essential hypertension  HYPERCHOLESTEROLEMIA  Other orders -     DULoxetine (CYMBALTA) 30 MG capsule; Take 2 capsules (60 mg total) by mouth daily. WITH A FULL STOMACH AT SUPPER TIME -     pantoprazole (PROTONIX) 40 MG tablet; Take 1 tablet (40 mg total) by mouth 2 (two) times daily. -     tamsulosin (FLOMAX) 0.4 MG CAPS capsule; Take 2 capsules (0.8 mg total) by mouth daily.       I have changed [place] DULoxetine, pantoprazole, and tamsulosin. I am also having him maintain his aspirin EC, ibuprofen, fluticasone furoate-vilanterol, fluticasone, simvastatin, BREO ELLIPTA, and PROAIR HFA.  Allergies as of 04/26/2017   No Known Allergies     Medication List        Accurate as of 04/26/17  6:13 PM. Always use your most recent med list.          aspirin EC 81 MG tablet Take 1 tablet (81 mg total) by mouth daily.   DULoxetine 30 MG capsule Commonly known as:  CYMBALTA Take 2 capsules (60 mg total) by mouth daily. WITH A FULL STOMACH AT SUPPER TIME   fluticasone 50 MCG/ACT nasal spray Commonly known as:  FLONASE Place 2 sprays into both nostrils daily.   fluticasone furoate-vilanterol 100-25 MCG/INH Aepb Commonly known as:  BREO ELLIPTA Inhale 1 puff into the lungs daily.   BREO ELLIPTA 100-25 MCG/INH Aepb Generic drug:  fluticasone furoate-vilanterol Inhale 1 puff into the lungs daily.   ibuprofen 400 MG tablet Commonly known as:  ADVIL,MOTRIN Take 1 tablet (400 mg total) by mouth every 4 (four) hours as needed.   pantoprazole 40 MG tablet Commonly known as:  PROTONIX Take 1 tablet (40 mg  total) by mouth 2 (two) times daily.   PROAIR HFA 108 (90 Base) MCG/ACT inhaler Generic drug:  albuterol 1-2 puffs every 6 hours as needed wheezing or shortness of breath.   simvastatin 20 MG tablet Commonly known as:  ZOCOR Take 1 tablet (20 mg total) at bedtime by mouth.   tamsulosin 0.4 MG Caps capsule Commonly known as:  FLOMAX Take 2 capsules (0.8 mg total) by mouth daily.        Follow-up: Return in about 3 months (around 07/25/2017).  [name], M.D.

## 2017-06-21 DIAGNOSIS — H5213 Myopia, bilateral: Secondary | ICD-10-CM | POA: Diagnosis not present

## 2017-07-07 ENCOUNTER — Other Ambulatory Visit: Payer: Self-pay | Admitting: Family Medicine

## 2017-07-07 DIAGNOSIS — I25119 Atherosclerotic heart disease of native coronary artery with unspecified angina pectoris: Secondary | ICD-10-CM

## 2017-07-07 DIAGNOSIS — E7800 Pure hypercholesterolemia, unspecified: Secondary | ICD-10-CM

## 2017-07-08 ENCOUNTER — Other Ambulatory Visit: Payer: Self-pay | Admitting: *Deleted

## 2017-07-08 DIAGNOSIS — J440 Chronic obstructive pulmonary disease with acute lower respiratory infection: Principal | ICD-10-CM

## 2017-07-08 DIAGNOSIS — J209 Acute bronchitis, unspecified: Secondary | ICD-10-CM

## 2017-07-08 MED ORDER — FLUTICASONE FUROATE-VILANTEROL 100-25 MCG/INH IN AEPB: INHALATION_SPRAY | RESPIRATORY_TRACT | 0 refills | Status: DC

## 2017-07-08 NOTE — Telephone Encounter (Signed)
OV 07/26/17

## 2017-07-10 ENCOUNTER — Inpatient Hospital Stay (HOSPITAL_COMMUNITY)
Admission: EM | Admit: 2017-07-10 | Discharge: 2017-07-11 | DRG: 287 | Disposition: A | Discharge status: Home Health | Payer: Medicare Other | Attending: Cardiovascular Disease | Admitting: Cardiovascular Disease

## 2017-07-10 ENCOUNTER — Inpatient Hospital Stay (HOSPITAL_COMMUNITY)
Admission: EM | Disposition: A | Discharge status: Home Health | Payer: Self-pay | Source: Home / Self Care | Attending: Cardiovascular Disease

## 2017-07-10 ENCOUNTER — Encounter (HOSPITAL_COMMUNITY): Payer: Self-pay

## 2017-07-10 DIAGNOSIS — I252 Old myocardial infarction: Secondary | ICD-10-CM | POA: Diagnosis not present

## 2017-07-10 DIAGNOSIS — I213 ST elevation (STEMI) myocardial infarction of unspecified site: Secondary | ICD-10-CM

## 2017-07-10 DIAGNOSIS — Z7951 Long term (current) use of inhaled steroids: Secondary | ICD-10-CM

## 2017-07-10 DIAGNOSIS — R0602 Shortness of breath: Secondary | ICD-10-CM | POA: Diagnosis not present

## 2017-07-10 DIAGNOSIS — Z79899 Other long term (current) drug therapy: Secondary | ICD-10-CM | POA: Diagnosis not present

## 2017-07-10 DIAGNOSIS — I10 Essential (primary) hypertension: Secondary | ICD-10-CM | POA: Diagnosis not present

## 2017-07-10 DIAGNOSIS — E785 Hyperlipidemia, unspecified: Secondary | ICD-10-CM | POA: Diagnosis present

## 2017-07-10 DIAGNOSIS — R079 Chest pain, unspecified: Secondary | ICD-10-CM | POA: Diagnosis not present

## 2017-07-10 DIAGNOSIS — Z9861 Coronary angioplasty status: Secondary | ICD-10-CM | POA: Diagnosis not present

## 2017-07-10 DIAGNOSIS — G809 Cerebral palsy, unspecified: Secondary | ICD-10-CM | POA: Diagnosis not present

## 2017-07-10 DIAGNOSIS — G40909 Epilepsy, unspecified, not intractable, without status epilepticus: Secondary | ICD-10-CM | POA: Diagnosis present

## 2017-07-10 DIAGNOSIS — K219 Gastro-esophageal reflux disease without esophagitis: Secondary | ICD-10-CM | POA: Diagnosis not present

## 2017-07-10 DIAGNOSIS — R0789 Other chest pain: Secondary | ICD-10-CM | POA: Diagnosis not present

## 2017-07-10 DIAGNOSIS — I249 Acute ischemic heart disease, unspecified: Secondary | ICD-10-CM | POA: Diagnosis not present

## 2017-07-10 DIAGNOSIS — I2510 Atherosclerotic heart disease of native coronary artery without angina pectoris: Principal | ICD-10-CM | POA: Diagnosis present

## 2017-07-10 DIAGNOSIS — J449 Chronic obstructive pulmonary disease, unspecified: Secondary | ICD-10-CM | POA: Diagnosis present

## 2017-07-10 DIAGNOSIS — Z8249 Family history of ischemic heart disease and other diseases of the circulatory system: Secondary | ICD-10-CM

## 2017-07-10 DIAGNOSIS — I25118 Atherosclerotic heart disease of native coronary artery with other forms of angina pectoris: Secondary | ICD-10-CM | POA: Diagnosis present

## 2017-07-10 DIAGNOSIS — F17210 Nicotine dependence, cigarettes, uncomplicated: Secondary | ICD-10-CM | POA: Diagnosis present

## 2017-07-10 DIAGNOSIS — F1910 Other psychoactive substance abuse, uncomplicated: Secondary | ICD-10-CM | POA: Diagnosis not present

## 2017-07-10 DIAGNOSIS — Z7982 Long term (current) use of aspirin: Secondary | ICD-10-CM

## 2017-07-10 DIAGNOSIS — F1010 Alcohol abuse, uncomplicated: Secondary | ICD-10-CM | POA: Diagnosis present

## 2017-07-10 DIAGNOSIS — F17200 Nicotine dependence, unspecified, uncomplicated: Secondary | ICD-10-CM | POA: Diagnosis present

## 2017-07-10 HISTORY — PX: LEFT HEART CATH AND CORONARY ANGIOGRAPHY: CATH118249

## 2017-07-10 LAB — CBC WITH DIFFERENTIAL/PLATELET
BASOS ABS: 0.1 10*3/uL (ref 0.0–0.1)
BASOS PCT: 1 %
Eosinophils Absolute: 0.9 10*3/uL — ABNORMAL HIGH (ref 0.0–0.7)
Eosinophils Relative: 9 %
HCT: 43.4 % (ref 39.0–52.0)
HEMOGLOBIN: 15.3 g/dL (ref 13.0–17.0)
LYMPHS PCT: 43 %
Lymphs Abs: 4.7 10*3/uL — ABNORMAL HIGH (ref 0.7–4.0)
MCH: 35.6 pg — ABNORMAL HIGH (ref 26.0–34.0)
MCHC: 35.3 g/dL (ref 30.0–36.0)
MCV: 100.9 fL — AB (ref 78.0–100.0)
MONO ABS: 0.5 10*3/uL (ref 0.1–1.0)
Monocytes Relative: 5 %
NEUTROS ABS: 4.5 10*3/uL (ref 1.7–7.7)
NEUTROS PCT: 42 %
Platelets: 198 10*3/uL (ref 150–400)
RBC: 4.3 MIL/uL (ref 4.22–5.81)
RDW: 13.3 % (ref 11.5–15.5)
WBC: 10.7 10*3/uL — AB (ref 4.0–10.5)

## 2017-07-10 SURGERY — LEFT HEART CATH AND CORONARY ANGIOGRAPHY
Anesthesia: LOCAL

## 2017-07-10 MED ORDER — IOPAMIDOL (ISOVUE-370) INJECTION 76%
INTRAVENOUS | Status: AC
  Filled 2017-07-10: qty 125

## 2017-07-10 MED ORDER — LIDOCAINE HCL (PF) 1 % IJ SOLN
INTRAMUSCULAR | Status: AC
  Filled 2017-07-10: qty 30

## 2017-07-10 MED ORDER — VERAPAMIL HCL 2.5 MG/ML IV SOLN
INTRAVENOUS | Status: AC
  Filled 2017-07-10: qty 2

## 2017-07-10 MED ORDER — MIDAZOLAM HCL 2 MG/2ML IJ SOLN
INTRAMUSCULAR | Status: AC
  Filled 2017-07-10: qty 2

## 2017-07-10 MED ORDER — HEPARIN (PORCINE) IN NACL 2-0.9 UNIT/ML-% IJ SOLN
INTRAMUSCULAR | Status: AC
  Filled 2017-07-10: qty 1000

## 2017-07-10 MED ORDER — NITROGLYCERIN 1 MG/10 ML FOR IR/CATH LAB
INTRA_ARTERIAL | Status: AC
  Filled 2017-07-10: qty 10

## 2017-07-10 MED ORDER — FENTANYL CITRATE (PF) 100 MCG/2ML IJ SOLN
INTRAMUSCULAR | Status: AC
  Filled 2017-07-10: qty 2

## 2017-07-10 MED ORDER — LIDOCAINE HCL (PF) 1 % IJ SOLN
INTRAMUSCULAR | Status: DC | PRN
  Administered 2017-07-10: 2 mL via INTRADERMAL

## 2017-07-10 SURGICAL SUPPLY — 13 items
CATH IMPULSE 5F ANG/FL3.5 (CATHETERS) ×2 IMPLANT
CATH INFINITI 5FR ANG PIGTAIL (CATHETERS) ×2 IMPLANT
DEVICE RAD COMP TR BAND LRG (VASCULAR PRODUCTS) ×2 IMPLANT
GUIDEWIRE INQWIRE 1.5J.035X260 (WIRE) ×1 IMPLANT
INQWIRE 1.5J .035X260CM (WIRE) ×2
KIT ENCORE 26 ADVANTAGE (KITS) ×4 IMPLANT
KIT HEART LEFT (KITS) ×2 IMPLANT
NEEDLE PERC 21GX4CM (NEEDLE) ×2 IMPLANT
PACK CARDIAC CATHETERIZATION (CUSTOM PROCEDURE TRAY) ×2 IMPLANT
SHEATH RAIN RADIAL 21G 6FR (SHEATH) ×4 IMPLANT
SYR MEDRAD MARK V 150ML (SYRINGE) ×2 IMPLANT
TRANSDUCER W/STOPCOCK (MISCELLANEOUS) ×2 IMPLANT
TUBING CIL FLEX 10 FLL-RA (TUBING) ×2 IMPLANT

## 2017-07-10 NOTE — ED Triage Notes (Signed)
Pt comes [facility], sudden onset on central CP with some SOB, PTA 324 ASA 8 mg morphine and one nitro. Hx of MI

## 2017-07-10 NOTE — ED Provider Notes (Signed)
[facility] CATH LAB Provider Note   CSN: [id number] Arrival date & time: 07/10/17  2332     History   Chief Complaint Chief Complaint  Patient presents with  . Code STEMI    HPI [name] is a 62 y.o. male.  62 year old gentleman with history of heart disease, status post MI x2, presents to the emergency department for evaluation of chest pain.  He has brought to the ER from home by EMS.  EMS activated code STEMI secondary to ST elevation noted on EKG.  Patient evaluated in conjunction with cardiology at arrival to the ER.  Patient reports onset of pain in his chest that radiated to the neck.  Reports shortness of breath associated with the pain.  He has received aspirin, nitroglycerin, morphine without relief.  Patient does indicate that this pain is in a similar pattern as his previous MI, although it is "much worse".     Past Medical History:  Diagnosis Date  . Cerebral palsy ([place])   . Coronary artery disease   . Depression   . GERD (gastroesophageal reflux disease)   . Hypertension   . Myocardial infarction ([place]) 2008  . Scoliosis   . Seizures ([place])    pt's sister states that patinet has had seizures in the past, pt camr for PAT by himself on last visit and she stst "they misunderstood what he was trying to say. Pt saw neurologist in March who did CT and states patient does not have seizures. On no meds, had seizures as child.    Patient Active Problem List   Diagnosis Date Noted  . Headache 10/23/2016  . Illiteracy 10/23/2016  . Carpal tunnel syndrome of right wrist 06/15/2016  . GAD (generalized anxiety disorder) 05/01/2016  . History of MI (myocardial infarction) 02/26/2015  . HYPERCHOLESTEROLEMIA 10/08/2009  . Infantile cerebral palsy ([place]) 10/08/2009  . Essential hypertension 10/08/2009  . Coronary atherosclerosis 10/08/2009    Past Surgical History:  Procedure Laterality Date  . CARPAL TUNNEL RELEASE Right 08/13/2016   Procedure: CARPAL TUNNEL RELEASE;  Surgeon: Carole Civil, MD;  Location: [facility];  Service: Orthopedics;  Laterality: Right;  . CORONARY ANGIOPLASTY WITH STENT PLACEMENT          Home Medications    Prior to Admission medications   Medication Sig Start Date End Date Taking? Authorizing Provider  aspirin EC 81 MG tablet Take 1 tablet (81 mg total) by mouth daily. 11/15/13   Minus Breeding, MD  DULoxetine (CYMBALTA) 30 MG capsule Take 2 capsules (60 mg total) by mouth daily. WITH A FULL STOMACH AT SUPPER TIME 04/26/17   [name], MD  fluticasone [facility]) 50 MCG/ACT nasal spray Place 2 sprays into both nostrils daily. 01/21/17   [name], MD  fluticasone furoate-vilanterol (BREO ELLIPTA) 100-25 MCG/INH AEPB Inhale 1 puff into the lungs daily. 01/21/17   [name], MD  fluticasone furoate-vilanterol (BREO ELLIPTA) 100-25 MCG/INH AEPB Inhale 1 puff into the lungs daily. 07/08/17   [name], MD  ibuprofen (ADVIL,MOTRIN) 400 MG tablet Take 1 tablet (400 mg total) by mouth every 4 (four) hours as needed. 08/13/16   Carole Civil, MD  pantoprazole (PROTONIX) 40 MG tablet Take 1 tablet (40 mg total) by mouth 2 (two) times daily. 04/26/17   [name], MD  PROAIR HFA 108 [phone] Base) MCG/ACT inhaler 1-2 puffs every 6 hours as needed wheezing or shortness of breath. 04/08/17   [name], MD  simvastatin (ZOCOR) 20  MG tablet Take 1 tablet (20 mg total) at bedtime by mouth. 07/08/17   [name], MD  tamsulosin (FLOMAX) 0.4 MG CAPS capsule Take 2 capsules (0.8 mg total) by mouth daily. 04/26/17   [name], MD    Family History Family History  Problem Relation Age of Onset  . CAD Father 70       CABG  . Heart disease Father   . CAD Brother   . Diabetes Mother   . Alcohol abuse Brother     Social History Social History   Tobacco Use  . Smoking status: Current Every Day Smoker    Packs/day: 1.00    Types: Cigarettes  . Smokeless tobacco: Never Used    Substance Use Topics  . Alcohol use: Yes    Comment: daily pt states "just a few"  . Drug use: No     Allergies   Patient has no known allergies.   Review of Systems Review of Systems  Respiratory: Positive for shortness of breath.   Cardiovascular: Positive for chest pain.  All other systems reviewed and are negative.    Physical Exam Updated Vital Signs BP 117/69   Pulse (!) 101   Temp 98.9 F (37.2 C)   Resp 20   SpO2 96%   Physical Exam  Constitutional: He is oriented to person, place, and time. He appears well-developed and well-nourished. No distress.  HENT:  Head: Normocephalic and atraumatic.  Right Ear: Hearing normal.  Left Ear: Hearing normal.  Nose: Nose normal.  Mouth/Throat: Oropharynx is clear and moist and mucous membranes are normal.  Eyes: Pupils are equal, round, and reactive to light. Conjunctivae and EOM are normal.  Neck: Normal range of motion. Neck supple.  Cardiovascular: Regular rhythm, S1 normal and S2 normal. Exam reveals no gallop and no friction rub.  No murmur heard. Pulmonary/Chest: Effort normal and breath sounds normal. No respiratory distress. He exhibits no tenderness.  Abdominal: Soft. Normal appearance and bowel sounds are normal. There is no hepatosplenomegaly. There is no tenderness. There is no rebound, no guarding, no tenderness at McBurney's point and negative Murphy's sign. No hernia.  Musculoskeletal: Normal range of motion.  Neurological: He is alert and oriented to person, place, and time. He has normal strength. No cranial nerve deficit or sensory deficit. Coordination normal. GCS eye subscore is 4. GCS verbal subscore is 5. GCS motor subscore is 6.  Skin: Skin is warm, dry and intact. No rash noted. No cyanosis.  Psychiatric: He has a normal mood and affect. His speech is normal and behavior is normal. Thought content normal.  Nursing note and vitals reviewed.    ED Treatments / Results  Labs (all labs ordered are  listed, but only abnormal results are displayed) Labs Reviewed  CBC WITH DIFFERENTIAL/PLATELET - Abnormal; Notable for the following components:      Result Value   WBC 10.7 (*)    MCV 100.9 (*)    MCH 35.6 (*)    Lymphs Abs 4.7 (*)    Eosinophils Absolute 0.9 (*)    All other components within normal limits  PROTIME-INR  APTT  COMPREHENSIVE METABOLIC PANEL  TROPONIN I  LIPID PANEL    EKG EKG Interpretation  Date/Time:  Saturday July 10 2017 23:37:13 EDT Ventricular Rate:  103 PR Interval:    QRS Duration: 115 QT Interval:  357 QTC Calculation: 468 R Axis:   -75 Text Interpretation:  Sinus or ectopic atrial tachycardia Nonspecific IVCD with LAD Inferior infarct,  acute (RCA) Probable RV involvement, suggest recording right precordial leads Baseline wander in lead(s) V4 Confirmed by Orpah Greek [phone]) on 07/10/2017 11:44:30 PM   Radiology No results found.  Procedures Procedures (including critical care time)  Medications Ordered in ED Medications - No data to display   Initial Impression / Assessment and Plan / ED Course  I have reviewed the triage vital signs and the nursing notes.  Pertinent labs & imaging results that were available during my care of the patient were reviewed by me and considered in my medical decision making (see chart for details).     Patient brought to the emergency department from home by EMS with concern for ST elevation MI.  EKG was reviewed, there is a history of some mild elevations in the inferior leads on previous EKGs, however, today elevation does appear more pronounced.  Patient brought to cardiac Cath Lab for potential intervention.  CRITICAL CARE Performed by: Orpah Greek   Total critical care time: 30 minutes  Critical care time was exclusive of separately billable procedures and treating other patients.  Critical care was necessary to treat or prevent imminent or life-threatening  deterioration.  Critical care was time spent personally by me on the following activities: development of treatment plan with patient and/or surrogate as well as nursing, discussions with consultants, evaluation of patient's response to treatment, examination of patient, obtaining history from patient or surrogate, ordering and performing treatments and interventions, ordering and review of laboratory studies, ordering and review of radiographic studies, pulse oximetry and re-evaluation of patient's condition.\  Final Clinical Impressions(s) / ED Diagnoses   Final diagnoses:  Acute ST elevation myocardial infarction (STEMI), unspecified artery [facility])    ED Discharge Orders    None       Orpah Greek, MD 07/10/17 2358

## 2017-07-10 NOTE — H&P (Addendum)
Cardiology Admission History and Physical:   Patient ID: [name]; MRN: [id number]; DOB: [birth year]   Admission date: 07/10/2017  Primary Care Provider: [name], MD Primary Cardiologist: Hochrein  Chief Complaint: Chest pain  Patient Profile:   [name] is a 62 y.o. male with a history of CAD, HTN, seizure disorder, tobacco abuse who presents with chest pain.   History of Present Illness:   [name] is a 62 y.o. male with a history of CAD, HTN, seizure disorder, tobacco abuse who presents with chest pain.   The patient has a history of CAD for which he followed previously with cardiology. He has has had a remote LHC that showed 30% LAD and 70% diagonal lesion. He has reported having stents in the past, although this was felt to be possibly incorrect per prior cardiology evaluation. He was last seen in cardiology clinic in 2015 at which time he denied any cardiac complaints. Nuclear stress test in 11/2013 showed low risk findings with fixed defect in inferior wall and normal EF. He has continued to follow with his PCP for management of HTN and HLD.  [name] was brought to the ED this evening after experiencing sudden onset chest pain while sitting at rest at home. He described the pain to be chest pressure that he felt to be similar to his prior angina. He had associated nausea and neck pain without any significant dyspnea or other symtopms. EMS was called and obtained ECG concerning for STEMI. He was given ASA, morphine and NTG with ongoing 10/10 chest pain and was brought to the ED temporarily en route to the cath lab. He states that he drank two shots of moonshine this evening and continues to smoke but denies other drug use.  The patient underwent LHC that showed a 70% LAD stenosis with other nonobstructive CAD and normal LVgram; no culprit lesion was identified.    Past Medical History:  Diagnosis Date  . Cerebral palsy ([place])   . Coronary artery disease   .  Depression   . GERD (gastroesophageal reflux disease)   . Hypertension   . Myocardial infarction ([place]) 2008  . Scoliosis   . Seizures ([place])    pt's sister states that patinet has had seizures in the past, pt camr for PAT by himself on last visit and she stst "they misunderstood what he was trying to say. Pt saw neurologist in March who did CT and states patient does not have seizures. On no meds, had seizures as child.    Past Surgical History:  Procedure Laterality Date  . CARPAL TUNNEL RELEASE Right 08/13/2016   Procedure: CARPAL TUNNEL RELEASE;  Surgeon: Carole Civil, MD;  Location: [facility];  Service: Orthopedics;  Laterality: Right;  . CORONARY ANGIOPLASTY WITH STENT PLACEMENT       Medications Prior to Admission: Prior to Admission medications   Medication Sig Start Date End Date Taking? Authorizing Provider  aspirin EC 81 MG tablet Take 1 tablet (81 mg total) by mouth daily. 11/15/13   Minus Breeding, MD  DULoxetine (CYMBALTA) 30 MG capsule Take 2 capsules (60 mg total) by mouth daily. WITH A FULL STOMACH AT SUPPER TIME 04/26/17   [name], MD  fluticasone [facility]) 50 MCG/ACT nasal spray Place 2 sprays into both nostrils daily. 01/21/17   [name], MD  fluticasone furoate-vilanterol (BREO ELLIPTA) 100-25 MCG/INH AEPB Inhale 1 puff into the lungs daily. 01/21/17   [name], MD  fluticasone furoate-vilanterol (BREO ELLIPTA)  100-25 MCG/INH AEPB Inhale 1 puff into the lungs daily. 07/08/17   [name], MD  ibuprofen (ADVIL,MOTRIN) 400 MG tablet Take 1 tablet (400 mg total) by mouth every 4 (four) hours as needed. 08/13/16   Carole Civil, MD  pantoprazole (PROTONIX) 40 MG tablet Take 1 tablet (40 mg total) by mouth 2 (two) times daily. 04/26/17   [name], MD  PROAIR HFA 108 [phone] Base) MCG/ACT inhaler 1-2 puffs every 6 hours as needed wheezing or shortness of breath. 04/08/17   [name], MD  simvastatin (ZOCOR) 20 MG tablet Take 1 tablet (20 mg total)  at bedtime by mouth. 07/08/17   [name], MD  tamsulosin (FLOMAX) 0.4 MG CAPS capsule Take 2 capsules (0.8 mg total) by mouth daily. 04/26/17   [name], MD     Allergies:   No Known Allergies  Social History:   Social History   Socioeconomic History  . Marital status: Single    Spouse name: Not on file  . Number of children: Not on file  . Years of education: Not on file  . Highest education level: Not on file  Occupational History  . Not on file  Social Needs  . Financial resource strain: Not on file  . Food insecurity:    Worry: Not on file    Inability: Not on file  . Transportation needs:    Medical: Not on file    Non-medical: Not on file  Tobacco Use  . Smoking status: Current Every Day Smoker    Packs/day: 1.00    Types: Cigarettes  . Smokeless tobacco: Never Used  Substance and Sexual Activity  . Alcohol use: Yes    Comment: daily pt states "just a few"  . Drug use: No  . Sexual activity: Never    Birth control/protection: None  Lifestyle  . Physical activity:    Days per week: Not on file    Minutes per session: Not on file  . Stress: Not on file  Relationships  . Social connections:    Talks on phone: Not on file    Gets together: Not on file    Attends religious service: Not on file    Active member of club or organization: Not on file    Attends meetings of clubs or organizations: Not on file    Relationship status: Not on file  . Intimate partner violence:    Fear of current or ex partner: Not on file    Emotionally abused: Not on file    Physically abused: Not on file    Forced sexual activity: Not on file  Other Topics Concern  . Not on file  Social History Narrative  . Not on file    Family History:   The patient's family history includes Alcohol abuse in his brother; CAD in his brother; CAD (age of onset: 61) in his father; Diabetes in his mother; Heart disease in his father.    ROS:  Please see the history of present illness.    All other ROS reviewed and negative.     Physical Exam/Data:   Vitals:   07/11/17 0011 07/11/17 0016 07/11/17 0021 07/11/17 0025  BP: (!) 92/54 (!) 93/53 (!) 97/58 (!) 103/52  Pulse: 98 97 97 95  Resp: (!) 24 20 (!) 21 (!) 23  Temp:      SpO2: 95% 100% 95% 96%   No intake or output data in the 24 hours ending 07/11/17 0029 There were no  vitals filed for this visit. There is no height or weight on file to calculate BMI.  General:  Chronically ill appearing older than stated age. Possibly inebriated.  HEENT: normal Lymph: no adenopathy Neck: no apparent JVD Cardiac:  normal S1, S2; RRR; no murmur   Lungs:  Coarse breath sounds bilaterally Abd: soft, nontender, nondistended Ext: no LE edema Musculoskeletal:  No deformities, BUE and BLE strength normal and equal Skin: warm and dry  Neuro:  No focal abnormalities noted Psych:  Talkative and possibly inebriated    EKG:  The ECG that was done and was personally reviewed and demonstrates NSR with incomplete RBBB and 1-2 mm ST elevation in inferior leads.   Relevant CV Studies: Nuclear Stress Test 2015: Low risk stress nuclear study Fixed defect in the inferior wall most consistent with diaphragmatic attenuation given normal LVF in the inferior wall on rest images. LV Ejection Fraction: 55%.  LV Wall Motion:  NL LV Function; NL Wall Motion  Laboratory Data:  ChemistryNo results for input(s): NA, K, CL, CO2, GLUCOSE, BUN, CREATININE, CALCIUM, GFRNONAA, GFRAA, ANIONGAP in the last 168 hours.  No results for input(s): PROT, ALBUMIN, AST, ALT, ALKPHOS, BILITOT in the last 168 hours. Hematology Recent Labs  Lab 07/10/17 2341  WBC 10.7*  RBC 4.30  HGB 15.3  HCT 43.4  MCV 100.9*  MCH 35.6*  MCHC 35.3  RDW 13.3  PLT 198   Cardiac EnzymesNo results for input(s): TROPONINI in the last 168 hours. No results for input(s): TROPIPOC in the last 168 hours.  BNPNo results for input(s): BNP, PROBNP in the last 168 hours.  DDimer No  results for input(s): DDIMER in the last 168 hours.  Radiology/Studies:  No results found.  Assessment and Plan:   Chest pain CAD The patient was brought to the cath lab after STEMI activation for chest pain and ECG changes. LHC showed 70% LAD stenosis and other nonobstructive disease without any lesion consistent with STEMI. The cause of his chest pain is therefore not clear. He would benefit from ongoing optimization of his CAD.  -Continue to monitor on telemetry -Continue to monitor symptoms -Continue ASA -Transitioning home simvastatin to atorvastatin -Starting metoprolol -HgA1c, lipid panel ordered -Echocardiogram ordered  HTN Not currently on any antihypertensive agents at home. BP low-normal in cath lab. -Starting metoprolol as above  HLD -Atorvastatin ordered as above  Substance abuse The patient reports moonshine use this evening but denies other substance use. He appeared moderately intoxicated on admission.  -Will order Utox, blood alcohol levels -May benefit from substance abuse counseling   COPD -Continue home inhalers  GERD -Continue PPI  Severity of Illness: The appropriate patient status for this patient is INPATIENT. Inpatient status is judged to be reasonable and necessary in order to provide the required intensity of service to ensure the patient's safety. The patient's presenting symptoms, physical exam findings, and initial radiographic and laboratory data in the context of their chronic comorbidities is felt to place them at high risk for further clinical deterioration. Furthermore, it is not anticipated that the patient will be medically stable for discharge from the hospital within 2 midnights of admission. The following factors support the patient status of inpatient.   " The patient's presenting symptoms include chest pain. " The worrisome physical exam findings include n/a. " The initial radiographic and laboratory data are worrisome because of ECG  changes. " The chronic co-morbidities include CAD.   * I certify that at the point of admission it is  my clinical judgment that the patient will require inpatient hospital care spanning beyond 2 midnights from the point of admission due to high intensity of service, high risk for further deterioration and high frequency of surveillance required.*    For questions or updates, please contact [place] Please consult [email or web address] for contact info under Cardiology/STEMI.    Signed, Nila Nephew, MD  07/11/2017 12:29 AM    Patient seen and examined. Agree with assessment and plan.  [name] is a 62 year old gentleman who had previously undergone cardiac catheterization which had shown 30% LAD and 70% diagonal stenoses.  Patient has a history of hyperlipidemia and has been on low-dose simvastatin.  He has a long history of tobacco use.  He also admits to frequent "white lightning " use.  Tonight after having 2 shots of moonshine.  He developed severe chest pressure.  He was brought by EMS and in transit was encoded as a STEMI.  His ECG showed sinus tachycardia 103.  There was 1 mm J-point elevation in leads 2, 3 and aVF with a large Q waves in lead 3, incomplete right bundle branch block, and T-wave inversion in aVL.  Upon arrival to [facility] ER he was still having chest pain. ,  Although his ECG was not definitive for a STEMI, due to ongoing chest pain.  Emergent cardiac catheterization was performed.  As noted above, catheterization shows evidence for coronary calcification.  There appears to be smooth 70% eccentric narrowing in the LAD immediately before a vertical takeoff diagonal with 70% ostial diagonal stenosis.  There is mild mid LAD narrowing of 20% in mid RCA narrowing of 20% and a large dominant RCA.  The above findings are not responsible for his ECG changes.  We'll plan optimal medical therapy with initiation of beta blocker, and possible addition of nitrates once blood pressure  improves.  He will be started on high potency statin therapy with target LDL less than 70 in attempt to induce plaque regression.   Troy Sine, MD, [facility] 07/11/2017 12:58 AM

## 2017-07-11 ENCOUNTER — Inpatient Hospital Stay (HOSPITAL_COMMUNITY): Payer: Medicare Other

## 2017-07-11 ENCOUNTER — Other Ambulatory Visit: Payer: Self-pay

## 2017-07-11 DIAGNOSIS — J449 Chronic obstructive pulmonary disease, unspecified: Secondary | ICD-10-CM | POA: Diagnosis not present

## 2017-07-11 DIAGNOSIS — I2510 Atherosclerotic heart disease of native coronary artery without angina pectoris: Secondary | ICD-10-CM | POA: Diagnosis not present

## 2017-07-11 DIAGNOSIS — F1010 Alcohol abuse, uncomplicated: Secondary | ICD-10-CM | POA: Diagnosis present

## 2017-07-11 DIAGNOSIS — K219 Gastro-esophageal reflux disease without esophagitis: Secondary | ICD-10-CM | POA: Diagnosis present

## 2017-07-11 DIAGNOSIS — Z79899 Other long term (current) drug therapy: Secondary | ICD-10-CM | POA: Diagnosis not present

## 2017-07-11 DIAGNOSIS — G40909 Epilepsy, unspecified, not intractable, without status epilepticus: Secondary | ICD-10-CM | POA: Diagnosis present

## 2017-07-11 DIAGNOSIS — G809 Cerebral palsy, unspecified: Secondary | ICD-10-CM | POA: Diagnosis present

## 2017-07-11 DIAGNOSIS — R079 Chest pain, unspecified: Secondary | ICD-10-CM | POA: Insufficient documentation

## 2017-07-11 DIAGNOSIS — Z7982 Long term (current) use of aspirin: Secondary | ICD-10-CM | POA: Diagnosis not present

## 2017-07-11 DIAGNOSIS — I25118 Atherosclerotic heart disease of native coronary artery with other forms of angina pectoris: Secondary | ICD-10-CM | POA: Diagnosis present

## 2017-07-11 DIAGNOSIS — F17210 Nicotine dependence, cigarettes, uncomplicated: Secondary | ICD-10-CM | POA: Diagnosis present

## 2017-07-11 DIAGNOSIS — F17200 Nicotine dependence, unspecified, uncomplicated: Secondary | ICD-10-CM | POA: Diagnosis present

## 2017-07-11 DIAGNOSIS — E785 Hyperlipidemia, unspecified: Secondary | ICD-10-CM | POA: Diagnosis not present

## 2017-07-11 DIAGNOSIS — F1910 Other psychoactive substance abuse, uncomplicated: Secondary | ICD-10-CM | POA: Diagnosis not present

## 2017-07-11 DIAGNOSIS — Z9861 Coronary angioplasty status: Secondary | ICD-10-CM | POA: Diagnosis not present

## 2017-07-11 DIAGNOSIS — I249 Acute ischemic heart disease, unspecified: Secondary | ICD-10-CM | POA: Diagnosis not present

## 2017-07-11 DIAGNOSIS — Z8249 Family history of ischemic heart disease and other diseases of the circulatory system: Secondary | ICD-10-CM | POA: Diagnosis not present

## 2017-07-11 DIAGNOSIS — I10 Essential (primary) hypertension: Secondary | ICD-10-CM | POA: Diagnosis not present

## 2017-07-11 DIAGNOSIS — I252 Old myocardial infarction: Secondary | ICD-10-CM | POA: Diagnosis not present

## 2017-07-11 DIAGNOSIS — Z7951 Long term (current) use of inhaled steroids: Secondary | ICD-10-CM | POA: Diagnosis not present

## 2017-07-11 LAB — COMPREHENSIVE METABOLIC PANEL
ALK PHOS: 64 U/L (ref 38–126)
ALT: 15 U/L — ABNORMAL LOW (ref 17–63)
ALT: 15 U/L — ABNORMAL LOW (ref 17–63)
ANION GAP: 18 — AB (ref 5–15)
AST: 19 U/L (ref 15–41)
AST: 20 U/L (ref 15–41)
Albumin: 3.6 g/dL (ref 3.5–5.0)
Albumin: 3.6 g/dL (ref 3.5–5.0)
Alkaline Phosphatase: 64 U/L (ref 38–126)
Anion gap: 15 (ref 5–15)
BILIRUBIN TOTAL: 0.7 mg/dL (ref 0.3–1.2)
BUN: 13 mg/dL (ref 6–20)
BUN: 14 mg/dL (ref 6–20)
CHLORIDE: 101 mmol/L (ref 101–111)
CO2: 19 mmol/L — ABNORMAL LOW (ref 22–32)
CO2: 21 mmol/L — AB (ref 22–32)
Calcium: 8.8 mg/dL — ABNORMAL LOW (ref 8.9–10.3)
Calcium: 8.8 mg/dL — ABNORMAL LOW (ref 8.9–10.3)
Chloride: 104 mmol/L (ref 101–111)
Creatinine, Ser: 1.01 mg/dL (ref 0.61–1.24)
Creatinine, Ser: 1.08 mg/dL (ref 0.61–1.24)
GFR calc Af Amer: >60 mL/min (ref >60)
GFR calc non Af Amer: >60 mL/min (ref >60)
Glucose, Bld: 102 mg/dL — ABNORMAL HIGH (ref 65–99)
Glucose, Bld: 99 mg/dL (ref 65–99)
POTASSIUM: 3.3 mmol/L — AB (ref 3.5–5.1)
POTASSIUM: 3.3 mmol/L — AB (ref 3.5–5.1)
SODIUM: 137 mmol/L (ref 135–145)
Sodium: 141 mmol/L (ref 135–145)
TOTAL PROTEIN: 6.8 g/dL (ref 6.5–8.1)
Total Bilirubin: 0.6 mg/dL (ref 0.3–1.2)
Total Protein: 7.2 g/dL (ref 6.5–8.1)

## 2017-07-11 LAB — ECHOCARDIOGRAM COMPLETE
HEIGHTINCHES: 72 in
WEIGHTICAEL: 3615.54 [oz_av]

## 2017-07-11 LAB — LIPID PANEL
CHOL/HDL RATIO: 3.3 ratio
CHOLESTEROL: 83 mg/dL (ref 0–200)
CHOLESTEROL: 87 mg/dL (ref 0–200)
HDL: 26 mg/dL — AB (ref >40)
HDL: 26 mg/dL — AB (ref >40)
LDL Cholesterol: 30 mg/dL (ref 0–99)
LDL Cholesterol: 31 mg/dL (ref 0–99)
TRIGLYCERIDES: 129 mg/dL (ref <150)
Total CHOL/HDL Ratio: 3.2 RATIO
Triglycerides: 156 mg/dL — ABNORMAL HIGH (ref <150)
VLDL: 26 mg/dL (ref 0–40)
VLDL: 31 mg/dL (ref 0–40)

## 2017-07-11 LAB — CREATININE, SERUM
Creatinine, Ser: 0.97 mg/dL (ref 0.61–1.24)
GFR calc Af Amer: >60 mL/min (ref >60)
GFR calc non Af Amer: >60 mL/min (ref >60)

## 2017-07-11 LAB — BASIC METABOLIC PANEL
ANION GAP: 14 (ref 5–15)
BUN: 12 mg/dL (ref 6–20)
CALCIUM: 8.7 mg/dL — AB (ref 8.9–10.3)
CO2: 24 mmol/L (ref 22–32)
Chloride: 100 mmol/L — ABNORMAL LOW (ref 101–111)
Creatinine, Ser: 0.96 mg/dL (ref 0.61–1.24)
GLUCOSE: 102 mg/dL — AB (ref 65–99)
POTASSIUM: 3.3 mmol/L — AB (ref 3.5–5.1)
Sodium: 138 mmol/L (ref 135–145)

## 2017-07-11 LAB — CBC
HEMATOCRIT: 40.8 % (ref 39.0–52.0)
HEMATOCRIT: 41.7 % (ref 39.0–52.0)
HEMOGLOBIN: 14.4 g/dL (ref 13.0–17.0)
HEMOGLOBIN: 14.7 g/dL (ref 13.0–17.0)
MCH: 35.5 pg — ABNORMAL HIGH (ref 26.0–34.0)
MCH: 35.6 pg — AB (ref 26.0–34.0)
MCHC: 35.3 g/dL (ref 30.0–36.0)
MCHC: 35.3 g/dL (ref 30.0–36.0)
MCV: 100.7 fL — ABNORMAL HIGH (ref 78.0–100.0)
MCV: 101 fL — AB (ref 78.0–100.0)
Platelets: 203 10*3/uL (ref 150–400)
Platelets: 205 10*3/uL (ref 150–400)
RBC: 4.04 MIL/uL — AB (ref 4.22–5.81)
RBC: 4.14 MIL/uL — ABNORMAL LOW (ref 4.22–5.81)
RDW: 13 % (ref 11.5–15.5)
RDW: 13.1 % (ref 11.5–15.5)
WBC: 10.5 10*3/uL (ref 4.0–10.5)
WBC: 10.7 10*3/uL — ABNORMAL HIGH (ref 4.0–10.5)

## 2017-07-11 LAB — PROTIME-INR
INR: 0.97
INR: 1.03
PROTHROMBIN TIME: 13.4 s (ref 11.4–15.2)
Prothrombin Time: 12.8 seconds (ref 11.4–15.2)

## 2017-07-11 LAB — RAPID URINE DRUG SCREEN, HOSP PERFORMED
Amphetamines: NOT DETECTED
BENZODIAZEPINES: POSITIVE — AB
Barbiturates: NOT DETECTED
COCAINE: NOT DETECTED
OPIATES: POSITIVE — AB
Tetrahydrocannabinol: NOT DETECTED

## 2017-07-11 LAB — ETHANOL: Alcohol, Ethyl (B): 131 mg/dL — ABNORMAL HIGH (ref <10)

## 2017-07-11 LAB — APTT
APTT: 30 s (ref 24–36)
APTT: 31 s (ref 24–36)

## 2017-07-11 LAB — MAGNESIUM: Magnesium: 2 mg/dL (ref 1.7–2.4)

## 2017-07-11 LAB — MRSA PCR SCREENING: MRSA by PCR: NEGATIVE

## 2017-07-11 LAB — TROPONIN I: Troponin I: 0.08 ng/mL (ref <0.03)

## 2017-07-11 LAB — HEMOGLOBIN A1C
Hgb A1c MFr Bld: 5 % (ref 4.8–5.6)
Mean Plasma Glucose: 96.8 mg/dL

## 2017-07-11 MED ORDER — IOPAMIDOL (ISOVUE-370) INJECTION 76%
INTRAVENOUS | Status: DC | PRN
  Administered 2017-07-11: 105 mL via INTRAVENOUS

## 2017-07-11 MED ORDER — POTASSIUM CHLORIDE CRYS ER 20 MEQ PO TBCR
20.0000 meq | EXTENDED_RELEASE_TABLET | ORAL | Status: DC | PRN
  Filled 2017-07-11: qty 1

## 2017-07-11 MED ORDER — POTASSIUM CHLORIDE 20 MEQ/15ML (10%) PO SOLN
40.0000 meq | Freq: Once | ORAL | Status: AC
Start: 2017-07-11 — End: 2017-07-11
  Administered 2017-07-11: 40 meq via ORAL
  Filled 2017-07-11: qty 30

## 2017-07-11 MED ORDER — HEPARIN SODIUM (PORCINE) 1000 UNIT/ML IJ SOLN
INTRAMUSCULAR | Status: AC
  Filled 2017-07-11: qty 1

## 2017-07-11 MED ORDER — SODIUM CHLORIDE 0.9 % IV SOLN: 250.0000 mL | INTRAVENOUS | Status: DC | PRN

## 2017-07-11 MED ORDER — FAMOTIDINE 20 MG PO TABS
20.0000 mg | ORAL_TABLET | Freq: Two times a day (BID) | ORAL | Status: DC
  Administered 2017-07-11: 20 mg via ORAL
  Filled 2017-07-11: qty 1

## 2017-07-11 MED ORDER — DIAZEPAM 5 MG PO TABS: 5.0000 mg | ORAL_TABLET | Freq: Four times a day (QID) | ORAL | Status: DC | PRN

## 2017-07-11 MED ORDER — ATORVASTATIN CALCIUM 80 MG PO TABS: 80.0000 mg | ORAL_TABLET | Freq: Every day | ORAL | Status: DC

## 2017-07-11 MED ORDER — METOPROLOL TARTRATE 25 MG PO TABS
25.0000 mg | ORAL_TABLET | Freq: Two times a day (BID) | ORAL | Status: DC
  Administered 2017-07-11 (×2): 25 mg via ORAL
  Filled 2017-07-11 (×2): qty 1

## 2017-07-11 MED ORDER — SODIUM CHLORIDE 0.9% FLUSH: 3.0000 mL | Freq: Two times a day (BID) | INTRAVENOUS | Status: DC

## 2017-07-11 MED ORDER — FLUTICASONE FUROATE-VILANTEROL 100-25 MCG/INH IN AEPB: 1.0000 | INHALATION_SPRAY | Freq: Every day | RESPIRATORY_TRACT | Status: DC

## 2017-07-11 MED ORDER — ASPIRIN EC 81 MG PO TBEC: 81.0000 mg | DELAYED_RELEASE_TABLET | Freq: Every day | ORAL | Status: DC

## 2017-07-11 MED ORDER — NITROGLYCERIN 0.4 MG SL SUBL: 0.4000 mg | SUBLINGUAL_TABLET | SUBLINGUAL | Status: DC | PRN

## 2017-07-11 MED ORDER — NITROGLYCERIN 0.4 MG SL SUBL: 0.4000 mg | SUBLINGUAL_TABLET | SUBLINGUAL | 3 refills | Status: DC | PRN

## 2017-07-11 MED ORDER — VERAPAMIL HCL 2.5 MG/ML IV SOLN
INTRAVENOUS | Status: DC | PRN
  Administered 2017-07-11: 10 mL via INTRA_ARTERIAL

## 2017-07-11 MED ORDER — TAMSULOSIN HCL 0.4 MG PO CAPS
0.8000 mg | ORAL_CAPSULE | Freq: Every day | ORAL | Status: DC
  Administered 2017-07-11: 0.8 mg via ORAL
  Filled 2017-07-11: qty 2

## 2017-07-11 MED ORDER — FENTANYL CITRATE (PF) 100 MCG/2ML IJ SOLN
INTRAMUSCULAR | Status: DC | PRN
  Administered 2017-07-11: 25 ug via INTRAVENOUS

## 2017-07-11 MED ORDER — ONDANSETRON HCL 4 MG/2ML IJ SOLN: 4.0000 mg | Freq: Four times a day (QID) | INTRAMUSCULAR | Status: DC | PRN

## 2017-07-11 MED ORDER — HEPARIN SODIUM (PORCINE) 1000 UNIT/ML IJ SOLN
INTRAMUSCULAR | Status: DC | PRN
  Administered 2017-07-11: 5000 [IU] via INTRAVENOUS

## 2017-07-11 MED ORDER — ATORVASTATIN CALCIUM 80 MG PO TABS: 80.0000 mg | ORAL_TABLET | Freq: Every day | ORAL | 3 refills | Status: DC

## 2017-07-11 MED ORDER — PANTOPRAZOLE SODIUM 40 MG PO TBEC
40.0000 mg | DELAYED_RELEASE_TABLET | Freq: Two times a day (BID) | ORAL | Status: DC
  Administered 2017-07-11: 40 mg via ORAL
  Filled 2017-07-11: qty 1

## 2017-07-11 MED ORDER — FLUTICASONE PROPIONATE 50 MCG/ACT NA SUSP: 2.0000 | Freq: Every day | NASAL | Status: DC

## 2017-07-11 MED ORDER — ALBUTEROL SULFATE (2.5 MG/3ML) 0.083% IN NEBU
3.0000 mL | INHALATION_SOLUTION | RESPIRATORY_TRACT | Status: DC | PRN
Start: 2017-07-11 — End: 2017-07-11

## 2017-07-11 MED ORDER — MIDAZOLAM HCL 2 MG/2ML IJ SOLN
INTRAMUSCULAR | Status: DC | PRN
  Administered 2017-07-11: 1 mg via INTRAVENOUS

## 2017-07-11 MED ORDER — ACETAMINOPHEN 325 MG PO TABS: 650.0000 mg | ORAL_TABLET | Freq: Four times a day (QID) | ORAL | Status: DC | PRN

## 2017-07-11 MED ORDER — SODIUM CHLORIDE 0.9 % IV SOLN
INTRAVENOUS | Status: AC | PRN
  Administered 2017-07-10: 150 mL/h via INTRAVENOUS
  Administered 2017-07-11: 200 mL/h via INTRAVENOUS

## 2017-07-11 MED ORDER — ACETAMINOPHEN 325 MG PO TABS: 650.0000 mg | ORAL_TABLET | ORAL | Status: DC | PRN

## 2017-07-11 MED ORDER — METOPROLOL TARTRATE 25 MG PO TABS: 25.0000 mg | ORAL_TABLET | Freq: Two times a day (BID) | ORAL | 3 refills | Status: DC

## 2017-07-11 MED ORDER — DULOXETINE HCL 60 MG PO CPEP: 60.0000 mg | ORAL_CAPSULE | Freq: Every day | ORAL | Status: DC

## 2017-07-11 MED ORDER — SODIUM CHLORIDE 0.9 % IV SOLN
INTRAVENOUS | Status: DC
  Administered 2017-07-11: 02:00:00 via INTRAVENOUS

## 2017-07-11 MED ORDER — SODIUM CHLORIDE 0.9% FLUSH: 3.0000 mL | INTRAVENOUS | Status: DC | PRN

## 2017-07-11 MED ORDER — HEPARIN SODIUM (PORCINE) 5000 UNIT/ML IJ SOLN: 5000.0000 [IU] | Freq: Three times a day (TID) | INTRAMUSCULAR | Status: DC

## 2017-07-11 MED ORDER — HEPARIN (PORCINE) IN NACL 2-0.9 UNIT/ML-% IJ SOLN
INTRAMUSCULAR | Status: AC | PRN
  Administered 2017-07-11 (×2): 500 mL

## 2017-07-11 NOTE — Discharge Summary (Signed)
Discharge Summary    Patient ID: [name],  MRN: [id number], DOB/AGE: 1955/05/06 62 y.o.  Admit date: 07/10/2017 Discharge date: 07/11/2017  Primary Care Provider: [name] Primary Cardiologist: Minus Breeding, MD  Discharge Diagnoses    Principal Problem:   ACS (acute coronary syndrome) [facility] Active Problems:   Dyslipidemia, goal LDL below 70   CAD (coronary artery disease), native coronary artery   Smoker   Allergies No Known Allergies  Diagnostic Studies/Procedures    Cath 07/10/17 Echo 07/10/17 _____________   History of Present Illness     62 y/o male with a history of past documented CAD- subsequently lost to follow up admitted 07/10/17 with chest pain.  Hospital Course      62 y.o. male with a past history of CAD, HTN, seizure disorder, and tobacco abuse who presented 07/10/17 with chest pain.   The patient has a history of CAD. He followed previously with cardiology. He has has had a remote LHC that showed 30% LAD and 70% diagonal lesion. He has reported having stents in the past, although this was felt to be possibly incorrect per prior cardiology evaluation. He was last seen in cardiology clinic in 2015 at which time he denied any cardiac complaints. Nuclear stress test in 11/2013 showed low risk findings with fixed defect in inferior wall and normal EF. He has continued to follow with his PCP for management of HTN and HLD.  [name] presented to the ED 07/10/17 after experiencing sudden onset chest pain while sitting at rest at home. He described the pain to be chest pressure that he felt to be similar to his prior angina. He had associated nausea and neck pain without any significant dyspnea or other symtopms. EMS was called and obtained ECG concerning for STEMI. He was given ASA, morphine and NTG with ongoing 10/10 chest pain and was brought to the ED, then up to the cath lab. He states that he drank two shots of moonshine this evening and continues to  smoke but denies other drug use. The patient underwent LHC that showed a 70% LAD stenosis with other nonobstructive CAD and normal LVgram; no culprit lesion was identified. he was watched overnight and his medications adjusted. Dr Sallyanne Kuster feels he can be discharged 07/11/17 with plans for OP follow up.   _____________  Discharge Vitals Blood pressure 119/72, pulse 86, temperature 98.5 F (36.9 C), temperature source Oral, resp. rate (!) 21, height 6' (1.829 m), weight 225 lb 15.5 oz (102.5 kg), SpO2 95 %.  Filed Weights   07/11/17 0100  Weight: 225 lb 15.5 oz (102.5 kg)    Labs & Radiologic Studies    CBC Recent Labs    07/10/17 2341 07/11/17 0000 07/11/17 0139  WBC 10.7* 10.7* 10.5  NEUTROABS 4.5  --   --   HGB 15.3 14.4 14.7  HCT 43.4 40.8 41.7  MCV 100.9* 101.0* 100.7*  PLT 198 205 160   Basic Metabolic Panel Recent Labs    07/11/17 0000 07/11/17 0139 07/11/17 0513  NA 141 138  --   K 3.3* 3.3*  --   CL 104 100*  --   CO2 19* 24  --   GLUCOSE 102* 102*  --   BUN 13 12  --   CREATININE 1.01 0.97  0.96  --   CALCIUM 8.8* 8.7*  --   MG  --   --  2.0   Liver Function Tests Recent Labs    07/10/17  2341 07/11/17 0000  AST 19 20  ALT 15* 15*  ALKPHOS 64 64  BILITOT 0.6 0.7  PROT 7.2 6.8  ALBUMIN 3.6 3.6   No results for input(s): LIPASE, AMYLASE in the last 72 hours. Cardiac Enzymes Recent Labs    07/10/17 2341 07/11/17 0000 07/11/17 1243  TROPONINI <0.03 <0.03 0.08*   BNP Invalid input(s): POCBNP D-Dimer No results for input(s): DDIMER in the last 72 hours. Hemoglobin A1C Recent Labs    07/11/17 0000  HGBA1C 5.0   Fasting Lipid Panel Recent Labs    07/11/17 0000  CHOL 83  HDL 26*  LDLCALC 31  TRIG 129  CHOLHDL 3.2   _____________  No results found. Disposition   Pt is being discharged home today in good condition.  Follow-up Plans & Appointments    Follow-up Information    [facility].     Specialty:  [place] Why:  HHRN/PT/aide arranged- they will call you to set up home visits Contact information: [place] [address] [phone]        Minus Breeding, MD Follow up.   Specialty:  Cardiology Why:  office will contact you Contact information: [place] [address] [phone]            Discharge Medications   Allergies as of 07/11/2017   No Known Allergies     Medication List    STOP taking these medications   simvastatin 20 MG tablet Commonly known as:  ZOCOR     TAKE these medications   acetaminophen 325 MG tablet Commonly known as:  TYLENOL Take 2 tablets (650 mg total) by mouth every 6 (six) hours as needed for mild pain or headache.   aspirin EC 81 MG tablet Take 1 tablet (81 mg total) by mouth daily.   atorvastatin 80 MG tablet Commonly known as:  LIPITOR Take 1 tablet (80 mg total) by mouth daily at 6 PM.   DULoxetine 30 MG capsule Commonly known as:  CYMBALTA Take 2 capsules (60 mg total) by mouth daily. WITH A FULL STOMACH AT SUPPER TIME   fluticasone 50 MCG/ACT nasal spray Commonly known as:  FLONASE Place 2 sprays into both nostrils daily.   fluticasone furoate-vilanterol 100-25 MCG/INH Aepb Commonly known as:  BREO ELLIPTA Inhale 1 puff into the lungs daily. What changed:  Another medication with the same name was removed. Continue taking this medication, and follow the directions you see here.   ibuprofen 400 MG tablet Commonly known as:  ADVIL,MOTRIN Take 1 tablet (400 mg total) by mouth every 4 (four) hours as needed.   metoprolol tartrate 25 MG tablet Commonly known as:  LOPRESSOR Take 1 tablet (25 mg total) by mouth 2 (two) times daily.   nitroGLYCERIN 0.4 MG SL tablet Commonly known as:  NITROSTAT Place 1 tablet (0.4 mg total) under the tongue every 5 (five) minutes x 3 doses as needed for chest pain.   pantoprazole 40 MG tablet Commonly known  as:  PROTONIX Take 1 tablet (40 mg total) by mouth 2 (two) times daily.   PROAIR HFA 108 (90 Base) MCG/ACT inhaler Generic drug:  albuterol 1-2 puffs every 6 hours as needed wheezing or shortness of breath.   tamsulosin 0.4 MG Caps capsule Commonly known as:  FLOMAX Take 2 capsules (0.8 mg total) by mouth daily.        Aspirin prescribed at discharge?  Yes High Intensity Statin Prescribed? (Lipitor  40-80mg  or Crestor 20-40mg ): Yes Beta Blocker Prescribed? Yes For EF <40%, was ACEI/ARB Prescribed? No: NA ADP Receptor Inhibitor Prescribed? (i.e. Plavix etc.-Includes Medically Managed Patients): No: NA- no PCI For EF <40%, Aldosterone Inhibitor Prescribed? No: NA Was EF assessed during THIS hospitalization? Yes-55% Was Cardiac Rehab II ordered? (Included Medically managed Patients): Yes   Outstanding Labs/Studies    Duration of Discharge Encounter   Greater than 30 minutes including physician time.  Angelena Form PA 07/11/2017, 2:32 PM

## 2017-07-11 NOTE — Progress Notes (Signed)
  Echocardiogram 2D Echocardiogram has been performed.  Darlina Sicilian M 07/11/2017, 1:28 PM

## 2017-07-11 NOTE — Progress Notes (Signed)
Still complains of some retrosternal soreness which is neither pleuritic nor anginal in its.  Does not have costochondral tenderness.  ECG is unchanged, low risk.  Initial set of cardiac enzymes is normal and has not yet been repeated. He lives alone and has no transportation.  Limited social support.  He did have home health, but this has somehow lapsed and he asks [place] to see about reinstatement.  We will get a case management evaluation. May be ready for discharge later today.  Sanda Klein, MD, [facility] [phone] office [phone] pager

## 2017-07-11 NOTE — Discharge Instructions (Signed)
Smoking Tobacco Information Smoking tobacco will very likely harm your health. Tobacco contains a poisonous (toxic), colorless chemical called nicotine. Nicotine affects the brain and makes tobacco addictive. This change in your brain can make it hard to stop smoking. Tobacco also has other toxic chemicals that can hurt your body and raise your risk of many cancers. How can smoking tobacco affect me? Smoking tobacco can increase your chances of having serious health conditions, such as:  Cancer. Smoking is most commonly associated with lung cancer, but can lead to cancer in other parts of the body.  Chronic obstructive pulmonary disease (COPD). This is a long-term lung condition that makes it hard to breathe. It also gets worse over time.  High blood pressure (hypertension), heart disease, stroke, or heart attack.  Lung infections, such as pneumonia.  Cataracts. This is when the lenses in the eyes become clouded.  Digestive problems. This may include peptic ulcers, heartburn, and gastroesophageal reflux disease (GERD).  Oral health problems, such as gum disease and tooth loss.  Loss of taste and smell.  Smoking can affect your appearance by causing:  Wrinkles.  Yellow or stained teeth, fingers, and fingernails.  Smoking tobacco can also affect your social life.  Many workplaces, [employer], hotels, and public places are tobacco-free. This means that you may experience challenges in finding places to smoke when away from home.  The cost of a smoking habit can be expensive. Expenses for someone who smokes come in two ways: ? You spend money on a regular basis to buy tobacco. ? Your health care costs in the long-term are higher if you smoke.  Tobacco smoke can also affect the health of those around you. Children of smokers have greater chances of: ? Sudden infant death syndrome (SIDS). ? Ear infections. ? Lung infections.  What lifestyle changes can be made?  Do not start  smoking. Quit if you already do.  To quit smoking: ? Make a plan to quit smoking and commit yourself to it. Look for programs to help you and ask your health care provider for recommendations and ideas. ? Talk with your health care provider about using nicotine replacement medicines to help you quit. Medicine replacement medicines include gum, lozenges, patches, sprays, or pills. ? Do not replace cigarette smoking with electronic cigarettes, which are commonly called e-cigarettes. The safety of e-cigarettes is not known, and some may contain harmful chemicals. ? Avoid places, people, or situations that tempt you to smoke. ? If you try to quit but return to smoking, don't give up hope. It is very common for people to try a number of times before they fully succeed. When you feel ready again, give it another try.  Quitting smoking might affect the way you eat as well as your weight. Be prepared to monitor your eating habits. Get support in planning and following a healthy diet.  Ask your health care provider about having regular tests (screenings) to check for cancer. This may include blood tests, imaging tests, and other tests.  Exercise regularly. Consider taking walks, joining a gym, or doing yoga or exercise classes.  Develop skills to manage your stress. These skills include meditation. What are the benefits of quitting smoking? By quitting smoking, you may:  Lower your risk of getting cancer and other diseases caused by smoking.  Live longer.  Breathe better.  Lower your blood pressure and heart rate.  Stop your addiction to tobacco.  Stop creating secondhand smoke that hurts other people.  Improve your  sense of taste and smell.  Look better over time, due to having fewer wrinkles and less staining.  What can happen if changes are not made? If you do not stop smoking, you may:  Get cancer and other diseases.  Develop COPD or other long-term (chronic) lung  conditions.  Develop serious problems with your heart and blood vessels (cardiovascular system).  Need more tests to screen for problems caused by smoking.  Have higher, long-term healthcare costs from medicines or treatments related to smoking.  Continue to have worsening changes in your lungs, mouth, and nose.  Where to find support: To get support to quit smoking, consider:  Asking your health care provider for more information and resources.  Taking classes to learn more about quitting smoking.  Looking for local organizations that offer resources about quitting smoking.  Joining a support group for people who want to quit smoking in your local community.  Where to find more information: You may find more information about quitting smoking from:  HelpGuide.org: www.helpguide.org/articles/addictions/how-to-quit-smoking.htm  https://hall.com/: smokefree.gov  American Lung Association: www.lung.org  Contact a health care provider if:  You have problems breathing.  Your lips, nose, or fingers turn blue.  You have chest pain.  You are coughing up blood.  You feel faint or you pass out.  You have other noticeable changes that cause you to worry. Summary  Smoking tobacco can negatively affect your health, the health of those around you, your finances, and your social life.  Do not start smoking. Quit if you already do. If you need help quitting, ask your health care provider.  Think about joining a support group for people who want to quit smoking in your local community. There are many effective programs that will help you to quit this behavior. This information is not intended to replace advice given to you by your health care provider. Make sure you discuss any questions you have with your health care provider. Document Released: 04/21/2016 Document Revised: 04/21/2016 Document Reviewed: 04/21/2016 Elsevier Interactive Patient Education  2018 [place]. Acute  Coronary Syndrome Acute coronary syndrome (ACS) is a serious problem in which there is suddenly not enough blood and oxygen reaching the heart. ACS can result in chest pain or a heart attack. What are the causes? This condition may be caused by:  A buildup of fat and cholesterol inside of the arteries (atherosclerosis). This is the most common cause. The buildup (plaque) can cause the blood vessels in your heart (coronary arteries) to become narrow or blocked. Plaque can also break off to form a clot.  A coronary spasm.  A tearing of the coronary artery (spontaneous coronary artery dissection).  Low blood pressure (hypotension).  An abnormal heart beat (arrhythmia).  Using cocaine or methamphetamine.  What increases the risk? The following factors may make you more likely to develop this condition:  Age.  History of chest pain, heart attack, or stroke.  Being male.  Family history of chest pain, heart disease, or stroke.  Smoking.  Inactivity.  Being overweight.  High cholesterol.  High blood pressure (hypertension).  Diabetes.  Excessive alcohol use.  What are the signs or symptoms? Common symptoms of this condition include:  Chest pain. The pain may last long, or may stop and come back (recur). It may feel like: ? Crushing or squeezing. ? Tightness, pressure, fullness, or heaviness.  Arm, neck, jaw, or back pain.  Heartburn or indigestion.  Shortness of breath.  Nausea.  Sudden cold sweats.  Lightheadedness.  Dizziness.  Tiredness (fatigue).  Sometimes there are no symptoms. How is this diagnosed? This condition may be diagnosed through:  An electrocardiogram (ECG). This test records the impulses of the heart.  Blood tests.  A CT scan of the chest.  A coronary angiogram. This procedure checks for a blockage in the coronary arteries.  How is this treated? Treatment for this condition may include:  Oxygen.  Medicines, such  as: ? Antiplatelet medicines and blood-thinning medicines, such as aspirin. These help prevent blood clots. ? Fibrinolytic therapy. This breaks apart a blood clot. ? Blood pressure medicines. ? Nitroglycerin. ? Pain medicine. ? Cholesterol medicine.  A procedure called coronary angioplasty and stenting. This is done to widen a narrowed artery and keep it open.  Coronary artery bypass surgery. This allows blood to pass the blockage to reach your heart.  Cardiac rehabilitation. This is a program that helps improve your health and well-being. It includes exercise training, education, and counseling to help you recover.  Follow these instructions at home: Eating and drinking  Follow a heart-healthy, low-salt (sodium) diet.  Use healthy cooking methods such as roasting, grilling, broiling, baking, poaching, steaming, or stir-frying.  Talk to a dietitian to learn about healthy cooking methods and how to eat less sodium. Medicines  Take over-the-counter and prescription medicines only as told by your health care provider.  Do not take these medicines unless your health care provider approves: ? Nonsteroidal anti-inflammatory drugs (NSAIDs), such as ibuprofen, naproxen, or celecoxib. ? Vitamin supplements that contain vitamin A or vitamin E. ? Hormone replacement therapy that contains estrogen. Activity  Join a cardiac rehabilitation program.  Ask your health care provider: ? What activities and exercises are safe for you. ? If you should follow specific instructions about lifting, driving, or climbing stairs.  If you are taking aspirin and another blood thinning medicine, avoid activities that are likely to result in an injury. The medicines can increase your risk of bleeding. Lifestyle  Do not use any products that contain nicotine or tobacco, such as cigarettes and e-cigarettes. If you need help quitting, ask your health care provider.  If you drink alcohol and your health care  provider says it is okay to drink, limit your alcohol intake to no more than 1 drink per day. One drink equals 12 oz of beer, 5 oz of wine, or 1 oz of hard liquor.  Maintain a healthy weight. If you need to lose weight, do it in a way that has been approved by your health care provider. General instructions  Tell all your health care providers about your heart condition, including your dentist. Some medicines can increase your risk of arrhythmia.  Manage other health conditions, such as hypertension and diabetes. These conditions affect your heart.  Learn ways to manage stress.  Get screened for depression, and seek treatment if needed.  Monitor your blood pressure if told by your health care provider.  Keep your vaccinations up to date. Get the annual influenza vaccine.  Keep all follow-up visits as told by your health care provider. This is important. Contact a health care provider if:  You feel overwhelmed or sad.  You have trouble with your daily activities. Get help right away if:  You have pain in your chest, neck, arm, jaw, stomach, or back that recurs, and: ? Lasts more than a few minutes. ? Is not relieved by taking the [place] health care provider prescribed.  You have unexplained: ? Heavy sweating. ? Heartburn or indigestion. ?  Shortness of breath. ? Difficulty breathing. ? Nausea or vomiting. ? Fatigue. ? Nervousness or anxiety. ? Weakness. ? Diarrhea. ? Dark stools or blood in the stool.  You have sudden lightheadedness or dizziness.  Your blood pressure is higher than 180/120  You faint.  You feel like hurting yourself or think about taking your own life. These symptoms may represent a serious problem that is an emergency. Do not wait to see if the symptoms will go away. Get medical help right away. Call your local emergency services (911 in the [place] Do not drive yourself to the clinic or hospital. Summary  Acute coronary syndrome (ACS) is a  when there is not enough blood and oxygen being supplied to the heart. ACS can result in chest pain or a heart attack.  Acute coronary syndrome is a medical emergency. If you have any symptoms of this condition, get help right away.  Treatment includes oxygen, medicines, and procedures to open the blocked arteries and restore blood flow. This information is not intended to replace advice given to you by your health care provider. Make sure you discuss any questions you have with your health care provider. Document Released: 04/06/2005 Document Revised: 05/08/2016 Document Reviewed: 05/08/2016 Elsevier Interactive Patient Education  2018 [employer].  Nitroglycerin sublingual tablets What is this medicine? NITROGLYCERIN (nye troe GLI ser in) is a type of vasodilator. It relaxes blood vessels, increasing the blood and oxygen supply to your heart. This medicine is used to relieve chest pain caused by angina. It is also used to prevent chest pain before activities like climbing stairs, going outdoors in cold weather, or sexual activity. This medicine may be used for other purposes; ask your health care provider or pharmacist if you have questions. COMMON BRAND NAME(S): Nitroquick, Nitrostat, Nitrotab What should I tell my health care provider before I take this medicine? They need to know if you have any of these conditions: -anemia -head injury, recent stroke, or bleeding in the brain -liver disease -previous heart attack -an unusual or allergic reaction to nitroglycerin, other medicines, foods, dyes, or preservatives -pregnant or trying to get pregnant -breast-feeding How should I use this medicine? Take this medicine by mouth as needed. At the first sign of an angina attack (chest pain or tightness) place one tablet under your tongue. You can also take this medicine 5 to 10 minutes before an event likely to produce chest pain. Follow the directions on the prescription label. Let the tablet  dissolve under the tongue. Do not swallow whole. Replace the dose if you accidentally swallow it. It will help if your mouth is not dry. Saliva around the tablet will help it to dissolve more quickly. Do not eat or drink, smoke or chew tobacco while a tablet is dissolving. If you are not better within 5 minutes after taking ONE dose of nitroglycerin, call 9-1-1 immediately to seek emergency medical care. Do not take more than 3 nitroglycerin tablets over 15 minutes. If you take this medicine often to relieve symptoms of angina, your doctor or health care professional may provide you with different instructions to manage your symptoms. If symptoms do not go away after following these instructions, it is important to call 9-1-1 immediately. Do not take more than 3 nitroglycerin tablets over 15 minutes. Talk to your pediatrician regarding the use of this medicine in children. Special care may be needed. Overdosage: If you think you have taken too much of this medicine contact a poison control center or emergency  room at once. NOTE: This medicine is only for you. Do not share this medicine with others. What if I miss a dose? This does not apply. This medicine is only used as needed. What may interact with this medicine? Do not take this medicine with any of the following medications: -certain migraine medicines like ergotamine and dihydroergotamine (DHE) -medicines used to treat erectile dysfunction like sildenafil, tadalafil, and vardenafil -riociguat This medicine may also interact with the following medications: -alteplase -aspirin -heparin -medicines for high blood pressure -medicines for mental depression -other medicines used to treat angina -phenothiazines like chlorpromazine, mesoridazine, prochlorperazine, thioridazine This list may not describe all possible interactions. Give your health care provider a list of all the medicines, herbs, non-prescription drugs, or dietary supplements you  use. Also tell them if you smoke, drink alcohol, or use illegal drugs. Some items may interact with your medicine. What should I watch for while using this medicine? Tell your doctor or health care professional if you feel your medicine is no longer working. Keep this medicine with you at all times. Sit or lie down when you take your medicine to prevent falling if you feel dizzy or faint after using it. Try to remain calm. This will help you to feel better faster. If you feel dizzy, take several deep breaths and lie down with your feet propped up, or bend forward with your head resting between your knees. You may get drowsy or dizzy. Do not drive, use machinery, or do anything that needs mental alertness until you know how this drug affects you. Do not stand or sit up quickly, especially if you are an older patient. This reduces the risk of dizzy or fainting spells. Alcohol can make you more drowsy and dizzy. Avoid alcoholic drinks. Do not treat yourself for coughs, colds, or pain while you are taking this medicine without asking your doctor or health care professional for advice. Some ingredients may increase your blood pressure. What side effects may I notice from receiving this medicine? Side effects that you should report to your doctor or health care professional as soon as possible: -blurred vision -dry mouth -skin rash -sweating -the feeling of extreme pressure in the head -unusually weak or tired Side effects that usually do not require medical attention (report to your doctor or health care professional if they continue or are bothersome): -flushing of the face or neck -headache -irregular heartbeat, palpitations -nausea, vomiting This list may not describe all possible side effects. Call your doctor for medical advice about side effects. You may report side effects to FDA at 1-800-FDA-1088. Where should I keep my medicine? Keep out of the reach of children. Store at room temperature  between 20 and 25 degrees C (68 and 77 degrees F). Store in [facility] [employer]. Protect from light and moisture. Keep tightly closed. Throw away any unused medicine after the expiration date. NOTE: This sheet is a summary. It may not cover all possible information. If you have questions about this medicine, talk to your doctor, pharmacist, or health care provider.  2018 Elsevier/Gold Standard (2013-02-02 17:57:36)

## 2017-07-11 NOTE — Progress Notes (Signed)
Pt. discharged to home w/ [facility] via sister, Caren Griffins. Discharge instructions and medication regimen reviewed at bedside with patient by SWOT RN. Pt. verbalizes understanding of instructions and medication regimen. Prescriptions sent to pharmacy. Patient assessment unchanged from this morning. TELE and IV discontinued per policy.

## 2017-07-11 NOTE — Progress Notes (Signed)
CRITICAL VALUE ALERT  Critical Value:  troponin 0.08  Date & Time Notifed: 07/11/17 2:18 PM  Provider Notified: Dr. Sallyanne Kuster  Orders Received/Actions taken: troponin trending down, pt is OK to discharge per MD. MD to place orders.

## 2017-07-11 NOTE — Care Management Note (Addendum)
Case Management Note Marvetta Gibbons RN, BSN Unit 4E-Case Manager- [place] coverage  [phone]   Patient Details  Name: [name] MRN: [id number] Date of Birth: 05-Feb-1956  Subjective/Objective:   Admitted with STEMI                 Action/Plan: PTA pt lived at home alone- per pt he lives in a trailer- referral received for [facility] needs- per conversation with pt he states he needs help at home- with "cooking, cleaning and medications" - he reports that he had aide services approved and his niece was doing it for him and getting paid but then decided she didn't want to do it anymore- explained to pt that this type of service comes from his medicaid and is approved through his PCP and [facility]- He will need to get this set back up through the process and f/u back up with his PCP for the paperwork process. Pt can have [facility] services with Medicare- explained to pt that this was not for someone to come cook/clean but for someone to come work with him for PT and an RN could come do his medications. Pt agreeable to this and does not have a preference for agency- will use [facility] for services- also discussed [facility] with pt who is agreeable to referral.- CM will make referral to [facility] for possible f/u in community. Call made to [facility] with [facility] for [facility] needs- referral accepted for RN/PT/aide. Pt states address in epic is his sister's- confirmed address for pt has- [address], phone- [phone] -- Pt also reports that his PCP is Dr. Livia Snellen at [facility].  Pt reports to CM that he can not read or write.  Pt is to try and find a ride home, bedside RN will let CM know if pt unable to find ride.   Expected Discharge Date:  07/13/17               Expected Discharge Plan:  [place]  In-House Referral:  [facility]  Discharge planning Services  CM Consult  Post Acute Care Choice:  Home Health Choice offered to:  Patient  DME Arranged:  N/A DME Agency:  NA  HH Arranged:  Disease  Management, PT, RN, Nurse's Aide [place] Agency:  [place]  Status of Service:  Completed, signed off  If discussed at [place] of Stay Meetings, dates discussed:    Discharge Disposition: home/home health   Additional Comments:  Dawayne Patricia, RN 07/11/2017, 12:55 PM

## 2017-07-12 ENCOUNTER — Encounter (HOSPITAL_COMMUNITY): Payer: Self-pay | Admitting: Cardiovascular Disease

## 2017-07-12 ENCOUNTER — Telehealth: Payer: Self-pay | Admitting: Family Medicine

## 2017-07-12 LAB — POCT I-STAT, CHEM 8
BUN: 15 mg/dL (ref 6–20)
CHLORIDE: 99 mmol/L — AB (ref 101–111)
Calcium, Ion: 1.16 mmol/L (ref 1.15–1.40)
Creatinine, Ser: 1.2 mg/dL (ref 0.61–1.24)
Glucose, Bld: 102 mg/dL — ABNORMAL HIGH (ref 65–99)
HEMATOCRIT: 42 % (ref 39.0–52.0)
Hemoglobin: 14.3 g/dL (ref 13.0–17.0)
POTASSIUM: 3.3 mmol/L — AB (ref 3.5–5.1)
SODIUM: 140 mmol/L (ref 135–145)
TCO2: 24 mmol/L (ref 22–32)

## 2017-07-12 MED FILL — Heparin Sodium (Porcine) 2 Unit/ML in Sodium Chloride 0.9%: INTRAMUSCULAR | Qty: 1000 | Status: AC

## 2017-07-12 MED FILL — Nitroglycerin IV Soln 100 MCG/ML in D5W: INTRA_ARTERIAL | Qty: 10 | Status: AC

## 2017-07-12 NOTE — Telephone Encounter (Signed)
Patient states that his friend was prescribed a pill to help him stop smoking and he would also like a pill. Please send to [facility] if you are willing to do.

## 2017-07-12 NOTE — Telephone Encounter (Signed)
Pt has some concerns about the meds to get him to stop smoking. Please advise.

## 2017-07-12 NOTE — Telephone Encounter (Signed)
The patient has an appointment for April 8.  We should go over that then

## 2017-07-13 ENCOUNTER — Other Ambulatory Visit: Payer: Self-pay | Admitting: *Deleted

## 2017-07-13 DIAGNOSIS — M419 Scoliosis, unspecified: Secondary | ICD-10-CM | POA: Diagnosis not present

## 2017-07-13 DIAGNOSIS — Z7951 Long term (current) use of inhaled steroids: Secondary | ICD-10-CM | POA: Diagnosis not present

## 2017-07-13 DIAGNOSIS — I252 Old myocardial infarction: Secondary | ICD-10-CM | POA: Diagnosis not present

## 2017-07-13 DIAGNOSIS — I249 Acute ischemic heart disease, unspecified: Secondary | ICD-10-CM | POA: Diagnosis not present

## 2017-07-13 DIAGNOSIS — I2510 Atherosclerotic heart disease of native coronary artery without angina pectoris: Secondary | ICD-10-CM | POA: Diagnosis not present

## 2017-07-13 DIAGNOSIS — I10 Essential (primary) hypertension: Secondary | ICD-10-CM | POA: Diagnosis not present

## 2017-07-13 DIAGNOSIS — G809 Cerebral palsy, unspecified: Secondary | ICD-10-CM | POA: Diagnosis not present

## 2017-07-13 DIAGNOSIS — K219 Gastro-esophageal reflux disease without esophagitis: Secondary | ICD-10-CM | POA: Diagnosis not present

## 2017-07-13 DIAGNOSIS — Z7982 Long term (current) use of aspirin: Secondary | ICD-10-CM | POA: Diagnosis not present

## 2017-07-13 DIAGNOSIS — E785 Hyperlipidemia, unspecified: Secondary | ICD-10-CM | POA: Diagnosis not present

## 2017-07-13 NOTE — Patient Outreach (Signed)
Referral received from primary MD office citing poor social support, pt cannot read or write, telephone call for screening, no answer to [phone] and no option to leave voicemail,  No answer to cell [phone] "disconnected".    PLAN  Outreach pt tomorrow  [name] [facility], [place] Coordinator [phone]

## 2017-07-14 ENCOUNTER — Other Ambulatory Visit: Payer: Self-pay | Admitting: *Deleted

## 2017-07-14 DIAGNOSIS — I25810 Atherosclerosis of coronary artery bypass graft(s) without angina pectoris: Secondary | ICD-10-CM

## 2017-07-14 NOTE — Patient Outreach (Addendum)
Referral received from MD office citing pt cannot read or write and has little social support, telephone call to pt for screening, spoke with pt, HIPAA verified, pt reports he does not drive, has "one friend that takes me sometimes and my sister checks in on me when she can"  Pt states he does have issues with transportation and " doesn't understand how all this works"  Pt states " someone Loss adjuster, chartered) comes out here to check on me but I don't know who it is, not sure where they come from"  Pt states there is some equipment he would like to check into such as shower seat, cane, or maybe help in the home through his medicaid benefit.  Pt states he has all medication "and as far as I know I can afford it, I don't really know"  Pt interested in [place] services for assistance with transportation and "getting help in the home if I can", pt agreeable for RN CM to make home visit to assess for any further needs.  RN CM placed order for [facility] CS[facility]. Pt with history CAD, HTN, anxiety, infantile cerebral palsy.  Primary MD office provides transition of care.  PLAN See pt for home visit next week  [name] [facility], [place] Coordinator [phone]

## 2017-07-15 LAB — RNA QUALITATIVE: HIV 1 RNA Qualitative: 1

## 2017-07-15 LAB — HIV ANTIBODY (ROUTINE TESTING W REFLEX): HIV SCREEN 4TH GENERATION: REACTIVE — AB

## 2017-07-15 LAB — HIV 1/2 AB DIFFERENTIATION
HIV 1 Ab: NEGATIVE
HIV 2 Ab: NEGATIVE
Note: NEGATIVE

## 2017-07-19 ENCOUNTER — Encounter: Payer: Self-pay | Admitting: Licensed Clinical Social Worker

## 2017-07-19 ENCOUNTER — Other Ambulatory Visit: Payer: Self-pay | Admitting: Licensed Clinical Social Worker

## 2017-07-19 DIAGNOSIS — I252 Old myocardial infarction: Secondary | ICD-10-CM | POA: Diagnosis not present

## 2017-07-19 DIAGNOSIS — I249 Acute ischemic heart disease, unspecified: Secondary | ICD-10-CM | POA: Diagnosis not present

## 2017-07-19 DIAGNOSIS — K219 Gastro-esophageal reflux disease without esophagitis: Secondary | ICD-10-CM | POA: Diagnosis not present

## 2017-07-19 DIAGNOSIS — G809 Cerebral palsy, unspecified: Secondary | ICD-10-CM | POA: Diagnosis not present

## 2017-07-19 DIAGNOSIS — E785 Hyperlipidemia, unspecified: Secondary | ICD-10-CM | POA: Diagnosis not present

## 2017-07-19 DIAGNOSIS — I2510 Atherosclerotic heart disease of native coronary artery without angina pectoris: Secondary | ICD-10-CM | POA: Diagnosis not present

## 2017-07-19 DIAGNOSIS — I10 Essential (primary) hypertension: Secondary | ICD-10-CM | POA: Diagnosis not present

## 2017-07-19 DIAGNOSIS — Z7982 Long term (current) use of aspirin: Secondary | ICD-10-CM | POA: Diagnosis not present

## 2017-07-19 DIAGNOSIS — M419 Scoliosis, unspecified: Secondary | ICD-10-CM | POA: Diagnosis not present

## 2017-07-19 DIAGNOSIS — Z7951 Long term (current) use of inhaled steroids: Secondary | ICD-10-CM | POA: Diagnosis not present

## 2017-07-19 NOTE — Patient Outreach (Signed)
Assessment:  CSW received referral on [place]. CSW completed chart review on client on 07/19/17.  Client was recently hospitalized from 07/10/17 to 07/11/17. Upon hospital discharge, client returned to his home with needed supports in place. [facility] received orders for client for home health nurse, for home health physical therapy and home health aide.  Client has difficulty with reading and writing.  He has some support from his sister, [name].  CSW spoke via phone with client on 07/19/17. CSW verified client identity. CSW received verbal permission from client on 07/19/17 for CSW to speak with client or with [name] about client needs. CSW spoke with [name] about client needs and transport support for client. Sanad has appointment with Dr. Livia Snellen on 07/26/17 at 10:55 AM at [facility]. Client has [employer] and has Medicaid benefit. Client said he needed transport help to go to his appointment with Dr. Livia Snellen on 07/26/17.  CSW called [facility] on 07/19/17 and scheduled client transport with that agency for client's 07/26/17 appointment with Dr. Livia Snellen. Transport costs to appointment with Dr. Livia Snellen for client will be billed under client's Medicaid benefit. Client has been receiving home health nursing support with [facility].  Client is scheduled to receive home health physical therapy support as well through [facility]. CSW spoke with client about client care plan. CSW encouraged client to communicate with CSW in next 30 days to discuss transportation resources for client in the community. Client has reduced family support. He has some financial challenges. CSW talked with client about client's insurance and discussed client's calling his insurance company to discuss possible transport benefits for client through his insurance provider.  Client and CSW completed needed [facility] assessments for client.   CSW  thanked client for phone call with CSW on 07/19/17.Client was appreciative of call from [place] on 07/19/17.   Plan:  Client to communicate with CSW in next 30 days to discuss transportation resources for client in the community  CSW to call client in 2 weeks to assess client needs.  [name].Aracelys Glade MSW, [name] Licensed Clinical Social Worker [facility] Care Management [phone]

## 2017-07-21 DIAGNOSIS — Z7982 Long term (current) use of aspirin: Secondary | ICD-10-CM | POA: Diagnosis not present

## 2017-07-21 DIAGNOSIS — K219 Gastro-esophageal reflux disease without esophagitis: Secondary | ICD-10-CM | POA: Diagnosis not present

## 2017-07-21 DIAGNOSIS — Z7951 Long term (current) use of inhaled steroids: Secondary | ICD-10-CM | POA: Diagnosis not present

## 2017-07-21 DIAGNOSIS — E785 Hyperlipidemia, unspecified: Secondary | ICD-10-CM | POA: Diagnosis not present

## 2017-07-21 DIAGNOSIS — I252 Old myocardial infarction: Secondary | ICD-10-CM | POA: Diagnosis not present

## 2017-07-21 DIAGNOSIS — I2510 Atherosclerotic heart disease of native coronary artery without angina pectoris: Secondary | ICD-10-CM | POA: Diagnosis not present

## 2017-07-21 DIAGNOSIS — I249 Acute ischemic heart disease, unspecified: Secondary | ICD-10-CM | POA: Diagnosis not present

## 2017-07-21 DIAGNOSIS — G809 Cerebral palsy, unspecified: Secondary | ICD-10-CM | POA: Diagnosis not present

## 2017-07-21 DIAGNOSIS — I10 Essential (primary) hypertension: Secondary | ICD-10-CM | POA: Diagnosis not present

## 2017-07-21 DIAGNOSIS — M419 Scoliosis, unspecified: Secondary | ICD-10-CM | POA: Diagnosis not present

## 2017-07-22 DIAGNOSIS — G809 Cerebral palsy, unspecified: Secondary | ICD-10-CM | POA: Diagnosis not present

## 2017-07-22 DIAGNOSIS — I249 Acute ischemic heart disease, unspecified: Secondary | ICD-10-CM | POA: Diagnosis not present

## 2017-07-22 DIAGNOSIS — Z7982 Long term (current) use of aspirin: Secondary | ICD-10-CM | POA: Diagnosis not present

## 2017-07-22 DIAGNOSIS — I10 Essential (primary) hypertension: Secondary | ICD-10-CM | POA: Diagnosis not present

## 2017-07-22 DIAGNOSIS — E785 Hyperlipidemia, unspecified: Secondary | ICD-10-CM | POA: Diagnosis not present

## 2017-07-22 DIAGNOSIS — Z7951 Long term (current) use of inhaled steroids: Secondary | ICD-10-CM | POA: Diagnosis not present

## 2017-07-22 DIAGNOSIS — I2510 Atherosclerotic heart disease of native coronary artery without angina pectoris: Secondary | ICD-10-CM | POA: Diagnosis not present

## 2017-07-22 DIAGNOSIS — K219 Gastro-esophageal reflux disease without esophagitis: Secondary | ICD-10-CM | POA: Diagnosis not present

## 2017-07-22 DIAGNOSIS — I252 Old myocardial infarction: Secondary | ICD-10-CM | POA: Diagnosis not present

## 2017-07-22 DIAGNOSIS — M419 Scoliosis, unspecified: Secondary | ICD-10-CM | POA: Diagnosis not present

## 2017-07-23 ENCOUNTER — Encounter: Payer: Self-pay | Admitting: *Deleted

## 2017-07-23 ENCOUNTER — Other Ambulatory Visit: Payer: Self-pay | Admitting: *Deleted

## 2017-07-23 VITALS — BP 118/60 | HR 79 | Resp 18 | Ht 73.0 in | Wt 200.0 lb

## 2017-07-23 DIAGNOSIS — I25810 Atherosclerosis of coronary artery bypass graft(s) without angina pectoris: Secondary | ICD-10-CM

## 2017-07-23 NOTE — Patient Outreach (Addendum)
[facility]) Care Management   07/23/2017  [name] 1955-12-15 [id number]  [name] is an 62 y.o. male  Subjective: Initial home visit with pt, HIPAA verified, pt reports he lives alone, has one sister [name] who assists on occasion, has a best friend [name] that assists with transportation, grocery shopping, pays bills over the phone, etc., pt never been married and has no children. Pt states he does not know who prefilled med box before home health (they are prefilling med box now), pt states he does not have pharmacy prefill med box " because my insurance doesn't pay for it"  Pt states he was born with cerebral palsy and has always been disabled, states " everybody wants me to go to some kind of home or facility, but I'm not going to, I'm staying here"  Objective:   Vitals:   07/23/17 1202  BP: 118/60  Pulse: 79  Resp: 18  SpO2: 95%  Weight: 200 lb (90.7 kg)  Height: 1.854 m (6\' 1" )   ROS  Physical Exam  Constitutional: He is oriented to person, place, and time. He appears well-developed and well-nourished.  HENT:  Head: Normocephalic.  Neck: Normal range of motion. Neck supple.  Cardiovascular: Normal rate and regular rhythm.  Respiratory: Effort normal and breath sounds normal.  GI: Soft. Bowel sounds are normal.  Musculoskeletal: Normal range of motion. He exhibits no edema.  Neurological: He is alert and oriented to person, place, and time.  Skin: Skin is warm and dry.  Psychiatric: He has a normal mood and affect. His behavior is normal.  Pt cannot read or write, does not know some details about medical history, poor historian.    Encounter Medications:   Outpatient Encounter Medications as of 07/23/2017  Medication Sig  . acetaminophen (TYLENOL) 325 MG tablet Take 2 tablets (650 mg total) by mouth every 6 (six) hours as needed for mild pain or headache.  [name] aspirin EC 81 MG tablet Take 1 tablet (81 mg total) by mouth daily.  [name]  atorvastatin (LIPITOR) 80 MG tablet Take 1 tablet (80 mg total) by mouth daily at 6 PM.  . DULoxetine (CYMBALTA) 30 MG capsule Take 2 capsules (60 mg total) by mouth daily. WITH A FULL STOMACH AT SUPPER TIME  . fluticasone (FLONASE) 50 MCG/ACT nasal spray Place 2 sprays into both nostrils daily.  . fluticasone furoate-vilanterol (BREO ELLIPTA) 100-25 MCG/INH AEPB Inhale 1 puff into the lungs daily.  [name] ibuprofen (ADVIL,MOTRIN) 400 MG tablet Take 1 tablet (400 mg total) by mouth every 4 (four) hours as needed.  . metoprolol tartrate (LOPRESSOR) 25 MG tablet Take 1 tablet (25 mg total) by mouth 2 (two) times daily.  . nitroGLYCERIN (NITROSTAT) 0.4 MG SL tablet Place 1 tablet (0.4 mg total) under the tongue every 5 (five) minutes x 3 doses as needed for chest pain.  . pantoprazole (PROTONIX) 40 MG tablet Take 1 tablet (40 mg total) by mouth 2 (two) times daily.  [name] PROAIR HFA 108 (90 Base) MCG/ACT inhaler 1-2 puffs every 6 hours as needed wheezing or shortness of breath.  . tamsulosin (FLOMAX) 0.4 MG CAPS capsule Take 2 capsules (0.8 mg total) by mouth daily. (Patient not taking: Reported on 07/23/2017)   No facility-administered encounter medications on file as of 07/23/2017.     Functional Status:   In your present state of health, do you have any difficulty performing the following activities: 07/23/2017 07/19/2017  Hearing? N N  Vision? N  N  Difficulty concentrating or making decisions? Y N  Walking or climbing stairs? Y Y  Dressing or bathing? N N  Doing errands, shopping? N N  Preparing Food and eating ? N N  Using the Toilet? N N  In the past six months, have you accidently leaked urine? N N  Do you have problems with loss of bowel control? N N  Managing your Medications? Y Y  Managing your Finances? [name]  Housekeeping or managing your Housekeeping? Y Y  Some recent data might be hidden    Fall/Depression Screening:    Fall Risk  07/23/2017 07/19/2017 04/26/2017  Falls in the past year? Yes Yes  No  Number falls in past yr: 1 1 -  Injury with Fall? No No -  Risk for fall due to : History of fall(s);Medication side effect Impaired balance/gait;Impaired mobility -  Follow up Falls evaluation completed;Falls prevention discussed Falls prevention discussed -   PHQ 2/9 Scores 07/23/2017 07/19/2017 07/14/2017 04/26/2017 02/23/2017 02/09/2017 10/23/2016  PHQ - 2 Score 0 2 0 0 0 0 1  PHQ- 9 Score - 7 - - - - -    Assessment:  RN CM observed and reviewed medication bottles with pt, home health RN is presently prefilling medication box but this is a short term solution, [facility] delivers the medication. RN CM placed order for [facility] pharmacy services, sent in basket to [place] reporting needs assistance completing HCPOA documents. Pt not interested in another level of care.  Pt can cook for himself, is able to shower although he does not have much use of left arm, is able to dress. RN CM faxed barrier letter and initial home visit to primary MD Dr. Livia Snellen.  THN CM Care Plan Problem One     Most Recent Value  Care Plan Problem One  Knowledge deficit related to coronary artery disease  Role Documenting the Problem One  Care Management Coordinator  Care Plan for Problem One  Active  THN Long Term Goal   Pt will verbalize/ demonstrate improved self care for coronary artery disease within 60 days  THN Long Term Goal Start Date  07/23/17  Interventions for Problem One Long Term Goal  RN CM gave [facility] calendar and 24 hour nurse advice line and reviewed with pt, reviewed resources to all such as home health, 24 hour nurse line, MD, 911, RN CM called patient's sister [name] for collaboration , no answer to telephone, left voicemail requesting return phone call.  Telephone call to [facility], spoke with James Evens RN who verified patient's nurse is James Poot, RN CM talked with [name] who reports she is not sure who will be prefilling med box when home health discharges, home health  plans to see pt for certification period 60 days.  THN CM Short Term Goal #1   pt will verbalize foods low in sodium to limit/ avoid within 30 days.  THN CM Short Term Goal #1 Start Date  07/23/17  Interventions for Short Term Goal #1  RN CM reviewed foods high in sodium to limit/ avoid, reviewed fresh and frozen is best, pt is using [employer]  THN CM Short Term Goal #2   pt will verbalize/ demonstrate ways to improve heart health within 30 days.  THN CM Short Term Goal #2 Start Date  07/23/17  Interventions for Short Term Goal #2  RN CM reviewed importance of working with PT and completing exercises daily, praised and  encouraged pt for smoking cessation, importance of attending all appointments.      Plan: see pt for home visit next month Collaborate with CSW, pharmacist, home health, patient's sister regarding plan of care Inquire of family if pt has ever had PCS services, awaiting call back from patient's sister  [name] [facility], BSN [place] Coordinator [phone]

## 2017-07-26 ENCOUNTER — Ambulatory Visit: Payer: Medicare Other | Admitting: Family Medicine

## 2017-07-26 ENCOUNTER — Other Ambulatory Visit: Payer: Self-pay | Admitting: *Deleted

## 2017-07-26 ENCOUNTER — Telehealth: Payer: Self-pay | Admitting: Pharmacist

## 2017-07-26 NOTE — Patient Outreach (Signed)
[facility]) Care Management  07/26/2017  [name] [birth year] [id number]   Patient was called regarding medication adherence and pill packing.  HIPAA identifiers were obtained.  Patient is a 62 year old male with multiple medical conditions including but not limited to:  Acute coronary syndrome, CAD, seizure disorder, hyperlipidemia, hypertension, generalized anxiety disorder, current smoker.  He was recently hospitalized for chest pain.  Patient said that he cannot read or write. At the time of my call, no one else was at home with him to help identify his medications. Patient uses a pill box that is currently being filled by home health but he is interested din pill packing.  He currently uses [facility] but said they charge of the packing. It is not clear how much [facility] charges but the patient said he is willing to change pharmacies if necessary.  Plan: Since patient is located in the area of the map that will be covered by [facility] Pharmacist, Mosie Lukes, the referral will be forwarded to her.  Elayne Guerin, PharmD, [place] Clinical Pharmacist [phone]

## 2017-07-26 NOTE — Telephone Encounter (Signed)
-----   Message from Oswaldo Done sent at 07/23/2017  2:33 PM EDT ----- Regarding: Referral Order for Besson,Agamjot Sheppard Penton,  Referral from [name], RN  "Please see request below"  Thank you,  Carliceia "Carlie" [facility] Management Assistant  ----- Message ----- From: Kassie Mends, RN Sent: 07/23/2017   2:26 PM To: Thn Cm Communication Orders Subject: Order for [place] J                        Patient Name: [name], [name] I([id number]) Sex: Male DOB: [birth year]    PCP: [name], [place]: [facility]   Types of orders made on 07/23/2017: Nursing  Order Date:07/23/2017 Ordering User:FARMER, Domingo Cocking [[id number]] Encounter Provider:Farmer, Domingo Cocking, RN [[id number]] Authorizing Provid er: [name], MD [phone] Department:THN-COMMUNITY[[id number]]  Order Specific Information Order: Comm to Pharmacy [Custom: [id number]]  Order #: [id number] Qty: 1   Priority: Routine  Class: Clinic Performed   Comment:Order for pharmacy,  Pt can't read or write, poor social support, has            home health prefilling med box but this will be short term.  Pt            states he doesn't know  who prefilled meds before home health and not            sure who will prefill when they discharge, family is not helping.  Pt            reports [facility] is able to do prefilled bubble packs but            "insurance doesn't pay for this" per pt.      thanks   Associated Diagnoses     I25.810 Coronary artery disease involving coronary bypass graft of native      heart, angina presence unspecified     Priority: Routine   Class: Clinic Performed   Comment:Order for pharmacy,  Pt can't read or write, poor social support, has            home health prefilling med box but this will be short term.  Pt            states he doesn't know who prefilled meds before home health and not            sure who will prefill when they discharge, family is  not helping.  Pt            reports [facility] is able to do prefilled bubble packs but            "insura nce doesn't pay for this" per pt.      thanks   Associated Diagnoses     I25.810 Coronary artery disease involving coronary bypass graft of native      heart, angina presence unspecified

## 2017-07-26 NOTE — Patient Outreach (Signed)
Patient's sister [name] called RN CM and left voicemail requesting return phone call.  Rn CM called [phone] multiple times with busy signal each time.   RN CM called primary care MD office, Dr. Livia Snellen and spoke with [name] who states she will try to find out who sent the referral and also have [name] call Rn CM back to provide more information on this case ,  RN CM reported there is limited information from home health as well as pt and have not been able to speak with patient's family.  PLAN Await call back from primary MD office  [name] [facility], [place] Coordinator [phone]

## 2017-07-27 ENCOUNTER — Ambulatory Visit (INDEPENDENT_AMBULATORY_CARE_PROVIDER_SITE_OTHER): Payer: Medicare Other

## 2017-07-27 DIAGNOSIS — K219 Gastro-esophageal reflux disease without esophagitis: Secondary | ICD-10-CM | POA: Diagnosis not present

## 2017-07-27 DIAGNOSIS — I2510 Atherosclerotic heart disease of native coronary artery without angina pectoris: Secondary | ICD-10-CM

## 2017-07-27 DIAGNOSIS — G809 Cerebral palsy, unspecified: Secondary | ICD-10-CM | POA: Diagnosis not present

## 2017-07-27 DIAGNOSIS — Z7982 Long term (current) use of aspirin: Secondary | ICD-10-CM | POA: Diagnosis not present

## 2017-07-27 DIAGNOSIS — M419 Scoliosis, unspecified: Secondary | ICD-10-CM | POA: Diagnosis not present

## 2017-07-27 DIAGNOSIS — I252 Old myocardial infarction: Secondary | ICD-10-CM

## 2017-07-27 DIAGNOSIS — I10 Essential (primary) hypertension: Secondary | ICD-10-CM

## 2017-07-27 DIAGNOSIS — Z7951 Long term (current) use of inhaled steroids: Secondary | ICD-10-CM | POA: Diagnosis not present

## 2017-07-27 DIAGNOSIS — E785 Hyperlipidemia, unspecified: Secondary | ICD-10-CM | POA: Diagnosis not present

## 2017-07-27 DIAGNOSIS — I249 Acute ischemic heart disease, unspecified: Secondary | ICD-10-CM | POA: Diagnosis not present

## 2017-07-28 ENCOUNTER — Other Ambulatory Visit: Payer: Self-pay

## 2017-07-28 ENCOUNTER — Telehealth: Payer: Self-pay | Admitting: Family Medicine

## 2017-07-28 ENCOUNTER — Ambulatory Visit: Payer: Self-pay

## 2017-07-28 DIAGNOSIS — K219 Gastro-esophageal reflux disease without esophagitis: Secondary | ICD-10-CM | POA: Diagnosis not present

## 2017-07-28 DIAGNOSIS — E785 Hyperlipidemia, unspecified: Secondary | ICD-10-CM | POA: Diagnosis not present

## 2017-07-28 DIAGNOSIS — I249 Acute ischemic heart disease, unspecified: Secondary | ICD-10-CM | POA: Diagnosis not present

## 2017-07-28 DIAGNOSIS — I10 Essential (primary) hypertension: Secondary | ICD-10-CM | POA: Diagnosis not present

## 2017-07-28 DIAGNOSIS — I2510 Atherosclerotic heart disease of native coronary artery without angina pectoris: Secondary | ICD-10-CM | POA: Diagnosis not present

## 2017-07-28 DIAGNOSIS — Z7951 Long term (current) use of inhaled steroids: Secondary | ICD-10-CM | POA: Diagnosis not present

## 2017-07-28 DIAGNOSIS — Z7982 Long term (current) use of aspirin: Secondary | ICD-10-CM | POA: Diagnosis not present

## 2017-07-28 DIAGNOSIS — I252 Old myocardial infarction: Secondary | ICD-10-CM | POA: Diagnosis not present

## 2017-07-28 DIAGNOSIS — G809 Cerebral palsy, unspecified: Secondary | ICD-10-CM | POA: Diagnosis not present

## 2017-07-28 DIAGNOSIS — M419 Scoliosis, unspecified: Secondary | ICD-10-CM | POA: Diagnosis not present

## 2017-07-28 NOTE — Patient Outreach (Addendum)
[facility]) Care Management  [place]   07/28/2017  [name] [birth year] [id number]  Patient was called regarding medication adherence and pill packing.  HIPAA identifiers were obtained.  Patient is a 62 year old male with multiple medical conditions including but not limited to:  Acute coronary syndrome, CAD, seizure disorder, hyperlipidemia, hypertension, generalized anxiety disorder, current smoker.  He was recently hospitalized for chest pain and left heart cath.   Successful outreach call placed to [name].  HIPAA identifiers verified.  Subjective: [name] verbalized that he would like to get pill compliance packaging to help him understand when to take his medications because he can't read or write.  He states that has a nurse who is temporarily filling his pill boxes.   Patient states that he doesn't have a lot of money left over after expenses each month to pay his bills.  Patient states he is willing to change pharmacies if needed as long as they deliver because he can't drive.    [name], Physical Therapist, Magda Paganini arrived at his home as I was speaking with him.  He asked her to read the prescription labels to me, while he verbalized how he was taking the medication.  Objective: Labs 07/11/2017 SCr 0.97mg /dL LDL 31mg /dL HDL 26mg /dL Triglycerides 129 mg/dL Glucose 99  Current Medications: Current Outpatient Medications  Medication Sig Dispense Refill  . acetaminophen (TYLENOL) 325 MG tablet Take 2 tablets (650 mg total) by mouth every 6 (six) hours as needed for mild pain or headache.    [name] aspirin EC 81 MG tablet Take 1 tablet (81 mg total) by mouth daily. 90 tablet 0  . atorvastatin (LIPITOR) 80 MG tablet Take 1 tablet (80 mg total) by mouth daily at 6 PM. 90 tablet 3  . DULoxetine (CYMBALTA) 30 MG capsule Take 2 capsules (60 mg total) by mouth daily. WITH A FULL STOMACH AT SUPPER TIME 180 capsule 1  . fluticasone (FLONASE) 50  MCG/ACT nasal spray Place 2 sprays into both nostrils daily. 16 g 5  . fluticasone furoate-vilanterol (BREO ELLIPTA) 100-25 MCG/INH AEPB Inhale 1 puff into the lungs daily. 90 each 0  . metoprolol tartrate (LOPRESSOR) 25 MG tablet Take 1 tablet (25 mg total) by mouth 2 (two) times daily. (Patient taking differently: Take 25 mg by mouth 2 (two) times daily. Takes at 0800 and 1800) 180 tablet 3  . nitroGLYCERIN (NITROSTAT) 0.4 MG SL tablet Place 1 tablet (0.4 mg total) under the tongue every 5 (five) minutes x 3 doses as needed for chest pain. (Patient taking differently: Place 0.4 mg under the tongue every 5 (five) minutes x 3 doses as needed for chest pain. Has, but reports not having ever used them) 25 tablet 3  . pantoprazole (PROTONIX) 40 MG tablet Take 1 tablet (40 mg total) by mouth 2 (two) times daily. (Patient taking differently: Take 40 mg by mouth 2 (two) times daily. Takes at 0800 and 1800) 180 tablet 1  . PROAIR HFA 108 (90 Base) MCG/ACT inhaler 1-2 puffs every 6 hours as needed wheezing or shortness of breath. 17 g 4  . tamsulosin (FLOMAX) 0.4 MG CAPS capsule Take 2 capsules (0.8 mg total) by mouth daily. (Patient not taking: Reported on 07/23/2017) 180 capsule 1   No current facility-administered medications for this visit.     Functional Status: In your present state of health, do you have any difficulty performing the following activities: 07/23/2017 07/19/2017  Hearing? N N  Vision? N N  Difficulty concentrating or making decisions? Y N  Walking or climbing stairs? Y Y  Dressing or bathing? N N  Doing errands, shopping? N N  Preparing Food and eating ? N N  Using the Toilet? N N  In the past six months, have you accidently leaked urine? N N  Do you have problems with loss of bowel control? N N  Managing your Medications? Y Y  Managing your Finances? [name]  Housekeeping or managing your Housekeeping? Y Y  Some recent data might be hidden    Fall/Depression Screening: Fall Risk   07/23/2017 07/19/2017 04/26/2017  Falls in the past year? Yes Yes No  Number falls in past yr: 1 1 -  Injury with Fall? No No -  Risk for fall due to : History of fall(s);Medication side effect Impaired balance/gait;Impaired mobility -  Follow up Falls evaluation completed;Falls prevention discussed Falls prevention discussed -   PHQ 2/9 Scores 07/23/2017 07/19/2017 07/14/2017 04/26/2017 02/23/2017 02/09/2017 10/23/2016  PHQ - 2 Score 0 2 0 0 0 0 1  PHQ- 9 Score - 7 - - - - -   ASSESSMENT: Date Discharged from Hospital: 07/11/17  Date Medication Reconciliation: 07/28/17  New Medications at Discharge:  Atorvastatin 80 mg- 1 tablet by mouth daily.  Metoprolol 25 mg - 1 tablet by mouth twice daily  Nitroglycerin SL 0.4 mg- 1 tablet under tongue every 5 minutes x 3 doses as needed for chest pain.  Patient was recently discharged from hospital and all medications have been reviewed  Drugs sorted by system:  Neurologic/Psychologic:duloxetine   Cardiovascular: aspirin, atorvastatin, metoprolol tartrate, nitroglycerin SL   Pulmonary/Allergy: fluticasone nasal, fluticasone-vilanterol, albuterol (Proventil HFA)  Gastrointestinal: pantoprazole  Pain: acetaminophen   Drug interactions: Duloxetine, a CYP2D6 inhibitor) may increase the serum concentration of metoprolol.  Other issues:  Please clarify if [name] is supposed to be on tamsolusin.  Assessment: Patient is currently using [facility] in [place], [place], but they charge $15/mo for compliance packaging.  Patient agrees to transfer to a pharmacy that does compliance packaging for free. A home health nurse is helping to fill his pill box currently, but he needs a long term solution.    Will switch to [facility] in [place] who does free pill compliance packaging with free delivery. Patient verbalized understanding and is agreeable. I have given them his information and they verified that he has Medicare and Medicaid, so he  will have very low if any copays on his medications.    Plan: I will route my note with mediation list to PCP, Dr. Livia Snellen.   Requets that he verify that medication list is correct.  Clarify if patient is to take tamsulosin as he reports that the is NOT taking this medication.  Please send new prescriptions for [name] to [facility] in [place] to initiate compliance packaging.    Follow up with [facility] to see when they anticipate first home delivery and schedule home visit the next day to make sure [name] understands compliance packaging.  Joetta Manners, PharmD Clinical Pharmacist [place] [phone]

## 2017-07-29 ENCOUNTER — Telehealth: Payer: Self-pay | Admitting: Family Medicine

## 2017-07-29 ENCOUNTER — Other Ambulatory Visit: Payer: Self-pay

## 2017-07-29 ENCOUNTER — Ambulatory Visit: Payer: Self-pay

## 2017-07-29 DIAGNOSIS — I2510 Atherosclerotic heart disease of native coronary artery without angina pectoris: Secondary | ICD-10-CM | POA: Diagnosis not present

## 2017-07-29 DIAGNOSIS — E785 Hyperlipidemia, unspecified: Secondary | ICD-10-CM | POA: Diagnosis not present

## 2017-07-29 DIAGNOSIS — Z7982 Long term (current) use of aspirin: Secondary | ICD-10-CM | POA: Diagnosis not present

## 2017-07-29 DIAGNOSIS — Z7951 Long term (current) use of inhaled steroids: Secondary | ICD-10-CM | POA: Diagnosis not present

## 2017-07-29 DIAGNOSIS — G809 Cerebral palsy, unspecified: Secondary | ICD-10-CM | POA: Diagnosis not present

## 2017-07-29 DIAGNOSIS — M419 Scoliosis, unspecified: Secondary | ICD-10-CM | POA: Diagnosis not present

## 2017-07-29 DIAGNOSIS — I10 Essential (primary) hypertension: Secondary | ICD-10-CM | POA: Diagnosis not present

## 2017-07-29 DIAGNOSIS — I252 Old myocardial infarction: Secondary | ICD-10-CM | POA: Diagnosis not present

## 2017-07-29 DIAGNOSIS — I249 Acute ischemic heart disease, unspecified: Secondary | ICD-10-CM | POA: Diagnosis not present

## 2017-07-29 DIAGNOSIS — K219 Gastro-esophageal reflux disease without esophagitis: Secondary | ICD-10-CM | POA: Diagnosis not present

## 2017-07-29 NOTE — Telephone Encounter (Signed)
Is patient supposed to be taking tamsulosin? Please review and advise and route to Pool B

## 2017-07-29 NOTE — Patient Outreach (Addendum)
[facility]) Care Management  07/29/2017  [name] 1956/03/03 [id number]    Patient was called regarding medication adherence and pill packing. HIPAA identifiers were obtained. Patient is a 62 year old male with multiple medical conditions including but not limited to: Acute coronary syndrome, CAD, seizure disorder, hyperlipidemia, hypertension, generalized anxiety disorder, current smoker. He was recently hospitalized for chest pain and left heart cath.    In-basket note to PCP and message left with staff at Dr. Livia Snellen office requesting that physician:  [place] medication list is correct or let me know of changes to make [name] and pharmacy aware.  Clarify if patient should be taking tamsulosin.  Patient reports NOT taking.  Send new prescriptions for all his current medications to [facility] in [place].  Joetta Manners, PharmD Clinical Pharmacist [place] [phone]

## 2017-07-30 ENCOUNTER — Other Ambulatory Visit: Payer: Self-pay

## 2017-07-30 DIAGNOSIS — J209 Acute bronchitis, unspecified: Secondary | ICD-10-CM

## 2017-07-30 DIAGNOSIS — J440 Chronic obstructive pulmonary disease with acute lower respiratory infection: Secondary | ICD-10-CM

## 2017-07-30 DIAGNOSIS — J441 Chronic obstructive pulmonary disease with (acute) exacerbation: Secondary | ICD-10-CM

## 2017-07-30 MED ORDER — FLUTICASONE PROPIONATE 50 MCG/ACT NA SUSP: 2.0000 | Freq: Every day | NASAL | 5 refills | Status: DC

## 2017-07-30 MED ORDER — NITROGLYCERIN 0.4 MG SL SUBL: 0.4000 mg | SUBLINGUAL_TABLET | SUBLINGUAL | 0 refills | Status: DC | PRN

## 2017-07-30 MED ORDER — PROAIR HFA 108 (90 BASE) MCG/ACT IN AERS
INHALATION_SPRAY | RESPIRATORY_TRACT | 4 refills | Status: DC
Start: 2017-07-30 — End: 2018-01-05

## 2017-07-30 MED ORDER — ATORVASTATIN CALCIUM 80 MG PO TABS: 80.0000 mg | ORAL_TABLET | Freq: Every day | ORAL | 3 refills | Status: DC

## 2017-07-30 MED ORDER — PANTOPRAZOLE SODIUM 40 MG PO TBEC: 40.0000 mg | DELAYED_RELEASE_TABLET | Freq: Two times a day (BID) | ORAL | 0 refills | Status: DC

## 2017-07-30 MED ORDER — FLUTICASONE FUROATE-VILANTEROL 100-25 MCG/INH IN AEPB
INHALATION_SPRAY | RESPIRATORY_TRACT | 0 refills | Status: DC
Start: 2017-07-30 — End: 2018-02-22

## 2017-07-30 MED ORDER — DULOXETINE HCL 30 MG PO CPEP: 60.0000 mg | ORAL_CAPSULE | Freq: Every day | ORAL | 1 refills | Status: DC

## 2017-07-30 MED ORDER — METOPROLOL TARTRATE 25 MG PO TABS: 25.0000 mg | ORAL_TABLET | Freq: Two times a day (BID) | ORAL | 0 refills | Status: DC

## 2017-07-30 MED ORDER — TAMSULOSIN HCL 0.4 MG PO CAPS: 0.8000 mg | ORAL_CAPSULE | Freq: Every day | ORAL | 1 refills | Status: DC

## 2017-07-30 NOTE — Telephone Encounter (Signed)
Tamsulosin is on med list (not pansulosin) He is scheduled for appt. Next Wednesday. Does he need refills before then? If so go ahead and send to [facility] as requested. Thanks,  WS

## 2017-07-30 NOTE — Telephone Encounter (Signed)
Sent message to [facility] in [facility] and sent meds to [facility]

## 2017-08-02 ENCOUNTER — Ambulatory Visit: Payer: Self-pay

## 2017-08-02 ENCOUNTER — Other Ambulatory Visit: Payer: Self-pay

## 2017-08-02 NOTE — Patient Outreach (Signed)
[facility]) Care Management  08/02/2017  [name] [birth year] [id number]  Patient was called regarding medication adherence and pill packing. HIPAA identifiers were obtained. Patient is a 62 year old male with multiple medical conditions including but not limited to: Acute coronary syndrome, CAD, seizure disorder, hyperlipidemia, hypertension, generalized anxiety disorder, current smoker. He was recently hospitalized for chest painand left heart cath.   Successful outreach attempt made to [name].  HIPAA identifiers verified.  Subjective: [name] states he is very appreciative of all the help he is getting with his medications. He states that he has enough of his current medication to last through this Friday.  He questions if he can mow his yard with a riding mower and weed eat given his heart condition.  He states he has an appointment with cardiology soon.  [name] reports that his heart doctor stopped the tamsulosin at his last hospital visit because it made him urinate frequently.     Medication Assistance: Prescriptions (inclduing tamsulosin) have been called into [name] in [place] with the exception of Aspirin EC 81mg .  I have sent an in-basket message to Dr. Livia Snellen' office asking to please call in new prescription.  [facility] plans to deliver on Thursday and I have a home visit schedule with [name] to review compliance packagaing.    Counseled patient not to mow or weed eat until he discusses with cardiology.  Office visit on 08/12/17, with Kerin Ransom, PA-C.     Plan: Follow up with Dr. Livia Snellen' office regarding Aspirin 81mg  EC prescription to add to his compliance pack.  Follow up in-basket message sent to Kerin Ransom, PA-C regarding clarification of if [name] is to continue tamsulosin.  Communicate with [place] whether to include tamsuloin in pill packaging.  Joetta Manners, PharmD Clinical Pharmacist [place] [phone]

## 2017-08-03 ENCOUNTER — Other Ambulatory Visit: Payer: Self-pay | Admitting: *Deleted

## 2017-08-03 ENCOUNTER — Other Ambulatory Visit: Payer: Self-pay | Admitting: Licensed Clinical Social Worker

## 2017-08-03 ENCOUNTER — Other Ambulatory Visit: Payer: Self-pay

## 2017-08-03 DIAGNOSIS — Z7951 Long term (current) use of inhaled steroids: Secondary | ICD-10-CM | POA: Diagnosis not present

## 2017-08-03 DIAGNOSIS — G809 Cerebral palsy, unspecified: Secondary | ICD-10-CM | POA: Diagnosis not present

## 2017-08-03 DIAGNOSIS — K219 Gastro-esophageal reflux disease without esophagitis: Secondary | ICD-10-CM | POA: Diagnosis not present

## 2017-08-03 DIAGNOSIS — I2510 Atherosclerotic heart disease of native coronary artery without angina pectoris: Secondary | ICD-10-CM | POA: Diagnosis not present

## 2017-08-03 DIAGNOSIS — I249 Acute ischemic heart disease, unspecified: Secondary | ICD-10-CM | POA: Diagnosis not present

## 2017-08-03 DIAGNOSIS — I252 Old myocardial infarction: Secondary | ICD-10-CM | POA: Diagnosis not present

## 2017-08-03 DIAGNOSIS — Z7982 Long term (current) use of aspirin: Secondary | ICD-10-CM | POA: Diagnosis not present

## 2017-08-03 DIAGNOSIS — M419 Scoliosis, unspecified: Secondary | ICD-10-CM | POA: Diagnosis not present

## 2017-08-03 DIAGNOSIS — E785 Hyperlipidemia, unspecified: Secondary | ICD-10-CM | POA: Diagnosis not present

## 2017-08-03 DIAGNOSIS — I10 Essential (primary) hypertension: Secondary | ICD-10-CM | POA: Diagnosis not present

## 2017-08-03 NOTE — Patient Outreach (Addendum)
[facility]) Care Management  08/03/2017  [name] Read [birth year] [id number]    Patient was called regarding medication adherence and pill packing. HIPAA identifiers were obtained. Patient is a 62 year old male with multiple medical conditions including but not limited to: Acute coronary syndrome, CAD, seizure disorder, hyperlipidemia, hypertension, generalized anxiety disorder, current smoker. He was recently hospitalized for chest painand left heart cath.   Successful outreach attempt made to [name].  HIPAA identifiers verified.  Medication Adherence: Compliance Packaging: Subjective: I placed a call to discuss compliance packaging with [name] and when his prescriptions could be refilled as reported to me by [facility].  [name] reports that his compliance pack had already arrived from [name] this week (not [name]).    Assessment: I verified with [facility] that they had delivered compliance packaging this week to [name].  They could not tell me the name of who arranged it, but I presume it to be his "therapy nurse" that has been coming out to his house and filling his pill box before [name].  There is no record of this in Epic.    Clarified with Dr.[name] that patient is to supposed to take tamsulosin 0.4mg  tablet, 1 tablet at bedtime.  Patient had been on 2 tablets at bedtime that was last filled in December because patient reports it made him urinate frequently. Called into [name] today.  [name] agreed to waive the compliance packaging fee of $5/mo, which was the original reason to switch to [facility]  [facility] will continue to fill [name] prescription using compliance packaging. They deliver pill packs weekly.   I have cancelled compliance packaging with [name].    Patient has [facility] LIS.  Plan: Continue with [name] for compliance packaging.  Tamsulosin  will be added to pack delivered next week.  Home visit with patient on 4/19 at noon to review compliance packaging.  Joetta Manners, PharmD Clinical Pharmacist [place] [phone]

## 2017-08-03 NOTE — Patient Outreach (Signed)
Assessment:  CSW spoke via phone with client. CSW verified client identity. CSW received verbal permission from client on 08/03/17 for CSW to speak with client or with his sister, [name] about client needs. CSW spoke with client about client care plan. CSW encouraged [place] to continue to speak with CSW in next 30 days to discuss transportation resources for client in the community.  CSW talked with client about transport help through [address]. Client has upcoming appointments. He has appointment on 08/04/17 with Dr. Livia Snellen at [facility]. He has appointment on 08/12/17 with Kerin Ransom, Physician Assistant Certified in [place], [place].  [facility] contacted [facility] on 08/03/17 to verify transport arrangements for client. Agency representative said that Lucianne Lei from agency would pick up client on 08/04/17 at 11:15 AM at home of client to transport him to appoitment with Dr. Livia Snellen. Client will call transport agency when done with appointment to receive transport from agency back home after appointment. CSW also scheduled agency to transport client to and from client's 08/12/17 appointment with Kerin Ransom in [place], [place]. Lucianne Lei from agency to pick up client at home of client on 08/12/17 and transport client to appointment so that client arrives at medical appointment at 2:15 AM on 08/12/17.  CSW shared the above transport information with client on 08/03/17. Client was appreciative of transport scheduling assistance.  Client is receiving in home physical therapy sessions as scheduled. Client said he has one friend who helps him occasionally. Client said he has financial challenges. Client said he has reduced family support. CSW encouraged client to call CSW as needed to discuss social work needs of client. Client was appreciative of call from [place] on 08/03/17  Plan:  Client to continue to speak with CSW in next 30 days to discuss  transportation resources for client in the community.   CSW to call client in 3 weeks to assess client needs at that time.   [name].Memory Heinrichs MSW, [name] Licensed Clinical Social Worker [facility] Care Management [phone]

## 2017-08-03 NOTE — Patient Outreach (Signed)
Telephone call to patient's sister [name] at [phone] and received message " user busy"  [name] [facility], BSN [place] Coordinator [phone]

## 2017-08-04 ENCOUNTER — Ambulatory Visit (INDEPENDENT_AMBULATORY_CARE_PROVIDER_SITE_OTHER): Payer: Medicare Other | Admitting: Family Medicine

## 2017-08-04 ENCOUNTER — Encounter: Payer: Self-pay | Admitting: Family Medicine

## 2017-08-04 VITALS — BP 120/77 | HR 82 | Temp 97.3°F | Ht 73.0 in | Wt 228.5 lb

## 2017-08-04 DIAGNOSIS — I25118 Atherosclerotic heart disease of native coronary artery with other forms of angina pectoris: Secondary | ICD-10-CM

## 2017-08-04 DIAGNOSIS — I252 Old myocardial infarction: Secondary | ICD-10-CM | POA: Diagnosis not present

## 2017-08-04 DIAGNOSIS — E785 Hyperlipidemia, unspecified: Secondary | ICD-10-CM

## 2017-08-04 DIAGNOSIS — F17200 Nicotine dependence, unspecified, uncomplicated: Secondary | ICD-10-CM | POA: Diagnosis not present

## 2017-08-04 DIAGNOSIS — K219 Gastro-esophageal reflux disease without esophagitis: Secondary | ICD-10-CM | POA: Diagnosis not present

## 2017-08-04 DIAGNOSIS — I2510 Atherosclerotic heart disease of native coronary artery without angina pectoris: Secondary | ICD-10-CM | POA: Diagnosis not present

## 2017-08-04 DIAGNOSIS — I10 Essential (primary) hypertension: Secondary | ICD-10-CM | POA: Diagnosis not present

## 2017-08-04 DIAGNOSIS — Z7982 Long term (current) use of aspirin: Secondary | ICD-10-CM | POA: Diagnosis not present

## 2017-08-04 DIAGNOSIS — Z550 Illiteracy and low-level literacy: Secondary | ICD-10-CM

## 2017-08-04 DIAGNOSIS — I249 Acute ischemic heart disease, unspecified: Secondary | ICD-10-CM | POA: Diagnosis not present

## 2017-08-04 DIAGNOSIS — M419 Scoliosis, unspecified: Secondary | ICD-10-CM | POA: Diagnosis not present

## 2017-08-04 DIAGNOSIS — G809 Cerebral palsy, unspecified: Secondary | ICD-10-CM | POA: Diagnosis not present

## 2017-08-04 DIAGNOSIS — Z7951 Long term (current) use of inhaled steroids: Secondary | ICD-10-CM | POA: Diagnosis not present

## 2017-08-05 DIAGNOSIS — I249 Acute ischemic heart disease, unspecified: Secondary | ICD-10-CM | POA: Diagnosis not present

## 2017-08-05 DIAGNOSIS — M419 Scoliosis, unspecified: Secondary | ICD-10-CM | POA: Diagnosis not present

## 2017-08-05 DIAGNOSIS — I10 Essential (primary) hypertension: Secondary | ICD-10-CM | POA: Diagnosis not present

## 2017-08-05 DIAGNOSIS — I252 Old myocardial infarction: Secondary | ICD-10-CM | POA: Diagnosis not present

## 2017-08-05 DIAGNOSIS — E785 Hyperlipidemia, unspecified: Secondary | ICD-10-CM | POA: Diagnosis not present

## 2017-08-05 DIAGNOSIS — Z7982 Long term (current) use of aspirin: Secondary | ICD-10-CM | POA: Diagnosis not present

## 2017-08-05 DIAGNOSIS — K219 Gastro-esophageal reflux disease without esophagitis: Secondary | ICD-10-CM | POA: Diagnosis not present

## 2017-08-05 DIAGNOSIS — Z7951 Long term (current) use of inhaled steroids: Secondary | ICD-10-CM | POA: Diagnosis not present

## 2017-08-05 DIAGNOSIS — I2510 Atherosclerotic heart disease of native coronary artery without angina pectoris: Secondary | ICD-10-CM | POA: Diagnosis not present

## 2017-08-05 DIAGNOSIS — G809 Cerebral palsy, unspecified: Secondary | ICD-10-CM | POA: Diagnosis not present

## 2017-08-05 LAB — CMP14+EGFR
ALT: 13 IU/L (ref 0–44)
AST: 15 IU/L (ref 0–40)
Albumin/Globulin Ratio: 1.6 (ref 1.2–2.2)
Albumin: 4.4 g/dL (ref 3.5–5.5)
Alkaline Phosphatase: 91 IU/L (ref 39–117)
BILIRUBIN TOTAL: 0.6 mg/dL (ref 0.0–1.2)
BUN/Creatinine Ratio: 11 (ref 9–20)
BUN: 9 mg/dL (ref 6–24)
CHLORIDE: 99 mmol/L (ref 96–106)
CO2: 31 mmol/L — AB (ref 20–29)
CREATININE: 0.8 mg/dL (ref 0.76–1.27)
Calcium: 9.6 mg/dL (ref 8.7–10.2)
GFR calc Af Amer: 113 mL/min/{1.73_m2} (ref >59)
GFR calc non Af Amer: 98 mL/min/{1.73_m2} (ref >59)
GLOBULIN, TOTAL: 2.7 g/dL (ref 1.5–4.5)
GLUCOSE: 95 mg/dL (ref 65–99)
POTASSIUM: 4.4 mmol/L (ref 3.5–5.2)
SODIUM: 145 mmol/L — AB (ref 134–144)
Total Protein: 7.1 g/dL (ref 6.0–8.5)

## 2017-08-05 LAB — CBC WITH DIFFERENTIAL/PLATELET
BASOS ABS: 0.1 10*3/uL (ref 0.0–0.2)
Basos: 0 %
EOS (ABSOLUTE): 1.6 10*3/uL — ABNORMAL HIGH (ref 0.0–0.4)
Eos: 12 %
HEMATOCRIT: 46 % (ref 37.5–51.0)
Hemoglobin: 16.2 g/dL (ref 13.0–17.7)
Immature Grans (Abs): 0.1 10*3/uL (ref 0.0–0.1)
Immature Granulocytes: 1 %
LYMPHS ABS: 3.6 10*3/uL — AB (ref 0.7–3.1)
Lymphs: 28 %
MCH: 35.4 pg — ABNORMAL HIGH (ref 26.6–33.0)
MCHC: 35.2 g/dL (ref 31.5–35.7)
MCV: 101 fL — ABNORMAL HIGH (ref 79–97)
MONOS ABS: 0.6 10*3/uL (ref 0.1–0.9)
Monocytes: 5 %
Neutrophils Absolute: 6.8 10*3/uL (ref 1.4–7.0)
Neutrophils: 54 %
Platelets: 253 10*3/uL (ref 150–379)
RBC: 4.57 x10E6/uL (ref 4.14–5.80)
RDW: 13.3 % (ref 12.3–15.4)
WBC: 12.7 10*3/uL — AB (ref 3.4–10.8)

## 2017-08-05 LAB — LIPID PANEL
CHOLESTEROL TOTAL: 93 mg/dL — AB (ref 100–199)
Chol/HDL Ratio: 3.1 ratio (ref 0.0–5.0)
HDL: 30 mg/dL — ABNORMAL LOW (ref >39)
LDL CALC: 41 mg/dL (ref 0–99)
TRIGLYCERIDES: 112 mg/dL (ref 0–149)
VLDL Cholesterol Cal: 22 mg/dL (ref 5–40)

## 2017-08-06 ENCOUNTER — Other Ambulatory Visit: Payer: Self-pay

## 2017-08-06 NOTE — Patient Outreach (Signed)
[facility]) Care Management  08/06/2017  Hyden Soley Sabey [birth year] [id number]  Patient was called regarding medication adherence and pill packing. HIPAA identifiers were obtained. Patient is a 62 year old male with multiple medical conditions including but not limited to: Acute coronary syndrome, CAD, seizure disorder, hyperlipidemia, hypertension, generalized anxiety disorder, current smoker. He was recently hospitalized for chest painand left heart cath.   Successful home visit made to Mr. Dearmas. HIPAA identifiers verified.  Medication Assistance I had previously arranged compliance pill packaging for Mr. Overfield with [facility] and wanted to do home visit to assess his understanding of pill packs..  Patient is currently  finishing medications in his pill box and will begin compliance packaged medications on Monday 4/22.  I reviewed how to use the compliance package and patient could verbalize correct way to take his medications. I notified [facility] that he will not need a pack delivered this week.  Pharmacy will deliver his new pack  on 4/ 27.   They deliver their pill packs weekly.    Patient also requested assistance with transportation to his doctors appointment on 4/25.  I confirmed via social work note that transportation is scheduled for that visit and sent  in-basket message to social work to coordinate his July 17 appointment with Dr. Livia Snellen.  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   I sent an in-basket note to [facility] Nurse, [name], with his concerns.   Plan:   Follow up with Mr. Sinkfield in a week to see if he has any questions or concerns with his medications.  Joetta Manners, PharmD Clinical Pharmacist [place] [phone]

## 2017-08-08 ENCOUNTER — Encounter: Payer: Self-pay | Admitting: Family Medicine

## 2017-08-08 NOTE — Progress Notes (Signed)
Subjective:  Patient ID: [name], male    DOB: 1955-09-20  Age: 62 y.o. MRN: [id number]  CC: Hospitalization Follow-up (pt here today for hospital follow up after being admitted for STEMI and [name] does have appt with cardiologist for follow up already)   HPI [name] presents for follow up due to recent ospitalization for acute coronary syndrome. Discharged on  March 23.Denies any chest pain since DC. Not dyspneic. Had some minor edema of the ankles. Medications changed as noted below. Needs his medications coordinated. Pt. Is unable to undersand his regimen due to inability to read. [name] has cerebral palsy.   Patient in for follow-up of elevated cholesterol. Doing well without complaints on current medication. Denies side effects of statin including myalgia and arthralgia and nausea. Also in today for liver function testing. Currently no chest pain, shortness of breath or other cardiovascular related symptoms noted.   Depression screen [facility] 2/9 07/23/2017 07/19/2017 07/14/2017  Decreased Interest 0 1 0  Down, Depressed, Hopeless 0 1 0  PHQ - 2 Score 0 2 0  Altered sleeping - 1 -  Tired, decreased energy - 1 -  Change in appetite - 1 -  Feeling bad or failure about yourself  - 0 -  Trouble concentrating - 1 -  Moving slowly or fidgety/restless - 1 -  Suicidal thoughts - 0 -  PHQ-9 Score - 7 -  Difficult doing work/chores - Somewhat difficult -  Some recent data might be hidden    History [name] has a past medical history of Cerebral palsy ([place]), Coronary artery disease, Depression, GERD (gastroesophageal reflux disease), Hypertension, Myocardial infarction ([place]) (2008), Scoliosis, and Seizures ([place]).   [name] has a past surgical history that includes Coronary angioplasty with stent; Carpal tunnel release (Right, 08/13/2016); and LEFT HEART CATH AND CORONARY ANGIOGRAPHY (N/A, 07/10/2017).   His family history includes Alcohol abuse in his brother; CAD in his brother; CAD (age of  onset: 52) in his father; Diabetes in his mother; Heart disease in his father.[name] reports that [name] has been smoking cigarettes.  [name] has been smoking about 1.00 pack per day. [name] has never used smokeless tobacco. [name] reports that [name] drinks alcohol. [name] reports that [name] does not use drugs.   Current Outpatient Medications on File Prior to Visit  Medication Sig Dispense Refill  . acetaminophen (TYLENOL) 325 MG tablet Take 2 tablets (650 mg total) by mouth every 6 (six) hours as needed for mild pain or headache.    [name] aspirin EC 81 MG tablet Take 1 tablet (81 mg total) by mouth daily. 90 tablet 0  . atorvastatin (LIPITOR) 80 MG tablet Take 1 tablet (80 mg total) by mouth daily at 6 PM. 90 tablet 3  . DULoxetine (CYMBALTA) 30 MG capsule Take 2 capsules (60 mg total) by mouth daily. WITH A FULL STOMACH AT SUPPER TIME 180 capsule 1  . fluticasone (FLONASE) 50 MCG/ACT nasal spray Place 2 sprays into both nostrils daily. 16 g 5  . fluticasone furoate-vilanterol (BREO ELLIPTA) 100-25 MCG/INH AEPB Inhale 1 puff into the lungs daily. 90 each 0  . metoprolol tartrate (LOPRESSOR) 25 MG tablet Take 1 tablet (25 mg total) by mouth 2 (two) times daily. 180 tablet 0  . nitroGLYCERIN (NITROSTAT) 0.4 MG SL tablet Place 1 tablet (0.4 mg total) under the tongue every 5 (five) minutes x 3 doses as needed for chest pain. 10 tablet 0  . pantoprazole (PROTONIX) 40 MG tablet Take 1 tablet (40  mg total) by mouth 2 (two) times daily. 180 tablet 0  . PROAIR HFA 108 (90 Base) MCG/ACT inhaler 1-2 puffs every 6 hours as needed wheezing or shortness of breath. 17 g 4  . tamsulosin (FLOMAX) 0.4 MG CAPS capsule Take 2 capsules (0.8 mg total) by mouth daily. 180 capsule 1   No current facility-administered medications on file prior to visit.     ROS Review of Systems  Constitutional: Negative.   HENT: Negative.   Eyes: Negative for visual disturbance.  Respiratory: Negative for cough and shortness of breath.   Cardiovascular:  Negative for chest pain and leg swelling.  Gastrointestinal: Negative for abdominal pain, diarrhea, nausea and vomiting.  Genitourinary: Negative for difficulty urinating.  Musculoskeletal: Negative for arthralgias and myalgias.  Skin: Negative for rash.  Neurological: Negative for headaches.  Psychiatric/Behavioral: Negative for sleep disturbance.    Objective:  BP 120/77   Pulse 82   Temp (!) 97.3 F (36.3 C) (Oral)   Ht _0  (1.854 m)   Wt 228 lb 8 oz (103.6 kg)   BMI 30.15 kg/m   BP Readings from Last 3 Encounters:  08/04/17 120/77  07/23/17 118/60  07/11/17 103/64    Wt Readings from Last 3 Encounters:  08/04/17 228 lb 8 oz (103.6 kg)  07/23/17 200 lb (90.7 kg)  07/11/17 225 lb 15.5 oz (102.5 kg)     Physical Exam  Constitutional: [name] is oriented to person, place, and time. [name] appears well-developed and well-nourished. No distress.  HENT:  Head: Normocephalic and atraumatic.  Right Ear: External ear normal.  Left Ear: External ear normal.  Nose: Nose normal.  Mouth/Throat: Oropharynx is clear and moist.  Eyes: Pupils are equal, round, and reactive to light. Conjunctivae and EOM are normal.  Neck: Normal range of motion. Neck supple. No thyromegaly present.  Cardiovascular: Normal rate, regular rhythm and normal heart sounds.  No murmur heard. Pulmonary/Chest: Effort normal and breath sounds normal. No respiratory distress. [name] has no wheezes. [name] has no rales.  Abdominal: Soft. Bowel sounds are normal. [name] exhibits no distension. There is no tenderness.  Lymphadenopathy:    [name] has no cervical adenopathy.  Neurological: [name] is alert and oriented to person, place, and time. [name] has normal reflexes.  Skin: Skin is warm and dry.  Psychiatric: [name] has a normal mood and affect. His behavior is normal. Judgment and thought content normal.      Assessment & Plan:   [name] was seen today for hospitalization follow-up.  Diagnoses and all orders for this visit:  ACS  (acute coronary syndrome) ([place]) -     CBC with Differential/Platelet -     CMP14+EGFR -     Lipid panel  Dyslipidemia, goal LDL below 70 -     CBC with Differential/Platelet -     CMP14+EGFR -     Lipid panel  Smoker  Essential hypertension -     CMP14+EGFR  Illiteracy  Coronary artery disease of native artery of native heart with stable angina pectoris (HCC) -     CBC with Differential/Platelet -     CMP14+EGFR -     Lipid panel       I am having Christian Mate Gottsch maintain his aspirin EC, acetaminophen, atorvastatin, DULoxetine, fluticasone, fluticasone furoate-vilanterol, metoprolol tartrate, nitroGLYCERIN, pantoprazole, PROAIR HFA, and tamsulosin.  Allergies as of 08/04/2017   No Known Allergies     Medication List        Accurate as of 08/04/17 11:59  PM. Always use your most recent med list.          acetaminophen 325 MG tablet Commonly known as:  TYLENOL Take 2 tablets (650 mg total) by mouth every 6 (six) hours as needed for mild pain or headache.   aspirin EC 81 MG tablet Take 1 tablet (81 mg total) by mouth daily.   atorvastatin 80 MG tablet Commonly known as:  LIPITOR Take 1 tablet (80 mg total) by mouth daily at 6 PM.   DULoxetine 30 MG capsule Commonly known as:  CYMBALTA Take 2 capsules (60 mg total) by mouth daily. WITH A FULL STOMACH AT SUPPER TIME   fluticasone 50 MCG/ACT nasal spray Commonly known as:  FLONASE Place 2 sprays into both nostrils daily.   fluticasone furoate-vilanterol 100-25 MCG/INH Aepb Commonly known as:  BREO ELLIPTA Inhale 1 puff into the lungs daily.   metoprolol tartrate 25 MG tablet Commonly known as:  LOPRESSOR Take 1 tablet (25 mg total) by mouth 2 (two) times daily.   nitroGLYCERIN 0.4 MG SL tablet Commonly known as:  NITROSTAT Place 1 tablet (0.4 mg total) under the tongue every 5 (five) minutes x 3 doses as needed for chest pain.   pantoprazole 40 MG tablet Commonly known as:  PROTONIX Take 1 tablet (40  mg total) by mouth 2 (two) times daily.   PROAIR HFA 108 (90 Base) MCG/ACT inhaler Generic drug:  albuterol 1-2 puffs every 6 hours as needed wheezing or shortness of breath.   tamsulosin 0.4 MG Caps capsule Commonly known as:  FLOMAX Take 2 capsules (0.8 mg total) by mouth daily.        Follow-up: Return in about 3 months (around 11/03/2017), or if symptoms worsen or fail to improve, for CAD.  [name], M.D.

## 2017-08-12 ENCOUNTER — Ambulatory Visit (INDEPENDENT_AMBULATORY_CARE_PROVIDER_SITE_OTHER): Payer: Medicare Other | Admitting: Cardiology

## 2017-08-12 ENCOUNTER — Encounter: Payer: Self-pay | Admitting: Cardiology

## 2017-08-12 VITALS — BP 138/84 | HR 78 | Ht 72.0 in | Wt 229.0 lb

## 2017-08-12 DIAGNOSIS — I10 Essential (primary) hypertension: Secondary | ICD-10-CM

## 2017-08-12 DIAGNOSIS — I2510 Atherosclerotic heart disease of native coronary artery without angina pectoris: Secondary | ICD-10-CM

## 2017-08-12 DIAGNOSIS — E785 Hyperlipidemia, unspecified: Secondary | ICD-10-CM

## 2017-08-12 DIAGNOSIS — Z550 Illiteracy and low-level literacy: Secondary | ICD-10-CM

## 2017-08-12 DIAGNOSIS — Z79899 Other long term (current) drug therapy: Secondary | ICD-10-CM | POA: Diagnosis not present

## 2017-08-12 DIAGNOSIS — R079 Chest pain, unspecified: Secondary | ICD-10-CM

## 2017-08-12 MED ORDER — ISOSORBIDE MONONITRATE ER 30 MG PO TB24: 30.0000 mg | ORAL_TABLET | Freq: Every day | ORAL | 3 refills | Status: DC

## 2017-08-12 NOTE — Assessment & Plan Note (Signed)
Instructions explained to the patient verbally.

## 2017-08-12 NOTE — Assessment & Plan Note (Signed)
His LDL was 41- he is on high dose statin Rx (? accurate result). Contniue high dose statin and check CMET and lipids in 3 months.

## 2017-08-12 NOTE — Assessment & Plan Note (Signed)
07/10/17-07/11/17 with chest pain- Troponin 0.08 Cath showed 70% LAD, normal VF

## 2017-08-12 NOTE — Assessment & Plan Note (Signed)
Controlled.  

## 2017-08-12 NOTE — Patient Instructions (Addendum)
Medication Instructions: Kerin Ransom, PA-C, has recommended making the following medication changes: 1. START Isosorbide 30 mg - take 1 tablet daily  Labwork: Your physician recommends that you return for lab work in 3 months - FASTING.  Testing/Procedures: NONE ORDERED  Follow-up: Lurena Joiner recommends that you schedule a follow-up appointment with him in 3 months - [place].  If you need a refill on your cardiac medications before your next appointment, please call your pharmacy.   Your physician discussed the hazards of tobacco use. Tobacco use cessation is recommended and techniques and options to help you quit were discussed.

## 2017-08-12 NOTE — Progress Notes (Signed)
08/12/2017 [name]   03/24/1956  [id number]  Primary Physician [name], MD Primary Cardiologist: Dr Percival Spanish  HPI:   62 y.o.illierate malewith a past history of CAD, HTN, seizure disorder, and tobacco abuse who presented 07/10/17 with chest pain.   The patient has a history of CAD. He followed previously with cardiology. He has has had a remote LHC that showed 30% LAD and 70% diagonal lesion. He has reported having stents in the past, although this was felt to be possibly incorrect per prior cardiology evaluation. He was last seen in cardiology clinic in 2015 at which time he denied any cardiac complaints. Nuclear stress test in 8/2015showed low risk findings with fixed defect in inferior wall and normal EF. He has continued to follow with his PCP for management of HTN and HLD.  [name] presented to the ED 07/10/17 after experiencing sudden onset chest pain while sitting at rest at home. He described the pain to be chest pressure that he felt to be similar to his prior angina. EMS was called and obtained ECG concerning for STEMI. He was given ASA, morphine and NTG with ongoing 10/10 chest pain and was brought to the ED, then up to the cath lab. He states that he drank two shots of moonshine that evening and continues to smoke but denies other drug use. The patient underwent LHC that showeda 70% LAD stenosis with other nonobstructive CAD and normal LVgram; no culprit lesion was identified.he was watched overnight and his medications adjusted. He is in the office today for follow up. He is not having chest pain. He is still smoking but trying to cut back. He is taking his medications. He asked about resuming yard work.    Current Outpatient Medications  Medication Sig Dispense Refill  . acetaminophen (TYLENOL) 325 MG tablet Take 2 tablets (650 mg total) by mouth every 6 (six) hours as needed for mild pain or headache.    [name] aspirin EC 81 MG tablet Take 1 tablet (81 mg total) by  mouth daily. 90 tablet 0  . atorvastatin (LIPITOR) 80 MG tablet Take 1 tablet (80 mg total) by mouth daily at 6 PM. 90 tablet 3  . DULoxetine (CYMBALTA) 30 MG capsule Take 2 capsules (60 mg total) by mouth daily. WITH A FULL STOMACH AT SUPPER TIME 180 capsule 1  . fluticasone (FLONASE) 50 MCG/ACT nasal spray Place 2 sprays into both nostrils daily. 16 g 5  . fluticasone furoate-vilanterol (BREO ELLIPTA) 100-25 MCG/INH AEPB Inhale 1 puff into the lungs daily. 90 each 0  . metoprolol tartrate (LOPRESSOR) 25 MG tablet Take 1 tablet (25 mg total) by mouth 2 (two) times daily. 180 tablet 0  . nitroGLYCERIN (NITROSTAT) 0.4 MG SL tablet Place 1 tablet (0.4 mg total) under the tongue every 5 (five) minutes x 3 doses as needed for chest pain. 10 tablet 0  . pantoprazole (PROTONIX) 40 MG tablet Take 1 tablet (40 mg total) by mouth 2 (two) times daily. 180 tablet 0  . PROAIR HFA 108 (90 Base) MCG/ACT inhaler 1-2 puffs every 6 hours as needed wheezing or shortness of breath. 17 g 4  . tamsulosin (FLOMAX) 0.4 MG CAPS capsule Take 2 capsules (0.8 mg total) by mouth daily. 180 capsule 1  . isosorbide mononitrate (IMDUR) 30 MG 24 hr tablet Take 1 tablet (30 mg total) by mouth daily. 90 tablet 3   No current facility-administered medications for this visit.     No Known Allergies  Past Medical History:  Diagnosis Date  . Cerebral palsy ([place])   . Coronary artery disease   . Depression   . GERD (gastroesophageal reflux disease)   . Hypertension   . Myocardial infarction ([place]) 2008  . Scoliosis   . Seizures ([place])    pt's sister states that patinet has had seizures in the past, pt camr for PAT by himself on last visit and she stst "they misunderstood what he was trying to say. Pt saw neurologist in March who did CT and states patient does not have seizures. On no meds, had seizures as child.    Social History   Socioeconomic History  . Marital status: Single    Spouse name: Not on file  . Number of  children: Not on file  . Years of education: Not on file  . Highest education level: Not on file  Occupational History  . Not on file  Social Needs  . Financial resource strain: Not on file  . Food insecurity:    Worry: Not on file    Inability: Not on file  . Transportation needs:    Medical: Not on file    Non-medical: Not on file  Tobacco Use  . Smoking status: Current Every Day Smoker    Packs/day: 1.00    Types: Cigarettes  . Smokeless tobacco: Never Used  Substance and Sexual Activity  . Alcohol use: Yes    Comment: daily pt states "just a few"  . Drug use: No  . Sexual activity: Never    Birth control/protection: None  Lifestyle  . Physical activity:    Days per week: Not on file    Minutes per session: Not on file  . Stress: Not on file  Relationships  . Social connections:    Talks on phone: Not on file    Gets together: Not on file    Attends religious service: Not on file    Active member of club or organization: Not on file    Attends meetings of clubs or organizations: Not on file    Relationship status: Not on file  . Intimate partner violence:    Fear of current or ex partner: Not on file    Emotionally abused: Not on file    Physically abused: Not on file    Forced sexual activity: Not on file  Other Topics Concern  . Not on file  Social History Narrative  . Not on file     Family History  Problem Relation Age of Onset  . CAD Father 79       CABG  . Heart disease Father   . CAD Brother   . Diabetes Mother   . Alcohol abuse Brother      Review of Systems: General: negative for chills, fever, night sweats or weight changes.  Cardiovascular: negative for chest pain, dyspnea on exertion, edema, orthopnea, palpitations, paroxysmal nocturnal dyspnea or shortness of breath Dermatological: negative for rash Respiratory: negative for cough or wheezing Urologic: negative for hematuria Abdominal: negative for nausea, vomiting, diarrhea, bright red  blood per rectum, melena, or hematemesis Neurologic: negative for visual changes, syncope, or dizziness All other systems reviewed and are otherwise negative except as noted above.    Blood pressure 138/84, pulse 78, height 6' (1.829 m), weight 229 lb (103.9 kg).  General appearance: alert, cooperative and no distress Neck: no carotid bruit and no JVD Lungs: decreased breath sounds Heart: regular rate and rhythm Extremities: extremities normal, atraumatic, no cyanosis  or edema Skin: Skin color, texture, turgor normal. No rashes or lesions Neurologic: Grossly normal   ASSESSMENT AND PLAN:   Chest pain 07/10/17-07/11/17 with chest pain- Troponin 0.08 Cath showed 70% LAD, normal VF  CAD (coronary artery disease), native coronary artery 70% LAD- medical Rx  Dyslipidemia, goal LDL below 70 His LDL was 41- he is on high dose statin Rx (? accurate result). Contniue high dose statin and check CMET and lipids in 3 months.   Essential hypertension Controlled  Illiteracy Instructions explained to the patient verbally.    PLAN  Add Imdur 30 mg, resume his activity next week as tolerated. F/U in 3 months with CMET and Lipids that visit- (he has been instructed to be fasting for that visit). I reinforced the importance of smoking cessation, he clearly has COPD on exam.   Kerin Ransom PA-C 08/12/2017 2:43 PM

## 2017-08-12 NOTE — Assessment & Plan Note (Signed)
70% LAD- medical Rx

## 2017-08-16 ENCOUNTER — Other Ambulatory Visit: Payer: Self-pay

## 2017-08-16 NOTE — Patient Outreach (Signed)
[place] [facility]) Care Management  08/16/2017  [name] November 01, 1955 [id number]   Patient was called regarding medication adherence and pill packing. HIPAA identifiers were obtained. Patient is a 62 year old male with multiple medical conditions including but not limited to: Acute coronary syndrome, CAD, seizure disorder, hyperlipidemia, hypertension, generalized anxiety disorder, current smoker. He was recently hospitalized for chest painand left heart cath.   Successful outreach call placed to [name].  HIPAA identifiers verified.  Subjective: [name] reports the he is doing "pretty good."  He reports being sleepy since the doctor added a new medication last week.  He states that he has not smoked since his doctor's visit last week.  He reports that the cardiologist said he could use his riding lawn mower, but he could not weed eat. Patient reports that he will need help with transportation to his cardiologist appoinment in July.  Patient also reports that he has some paper work about choosing an aide that he needs assistance reading.    Objective: 08/12/17 Total Cholesterol 93 mg/dL LDL 41 mg/dL  Scr 0.8 mg/dL  Assessment: [facility] verified that they filled his Imdur 30 mg prescription and it is in his compliance pack.  I reassured [name] that the Imdur may make him drowsy at first, but that this side effect should decrease once his body adjust to the new medication.   I informed [name] that he has a [facility] nurse home visit with Almyra Free on 5/2 and she will be able to look at his paperwork and read it to him.   I also informed him to expect a Lynden Work call from [place] on 5/8 and that Nicki Reaper is aware of [name] transportation needs.  I counseled patient on the importance of not smoking and congratulated him on stopping.      Plan: Follow up with patient in two weeks to see if he has any additional medication needs.  Joetta Manners, PharmD Clinical  Pharmacist [place] [phone]

## 2017-08-19 ENCOUNTER — Other Ambulatory Visit: Payer: Self-pay | Admitting: *Deleted

## 2017-08-19 ENCOUNTER — Encounter: Payer: Self-pay | Admitting: *Deleted

## 2017-08-19 NOTE — Patient Outreach (Signed)
[facility]) Care Management   08/19/2017  [name] 1955/05/08 [id number]  [name] is an 62 y.o. male  Subjective: Routine home visit with pt, HIPAA verified, pt reports on new medication imdur and has had nausea and headache since starting new medication, pt now has prefilled med packs from pharmacy.  Representative from [place] came to patient's home for assessment to reinstate PCS services, pt awaiting call back.  Objective:   Vitals:   08/19/17 1352 08/19/17 1353  BP:  126/62  Pulse: 89 89  Resp:  16  SpO2: 94% 94%   ROS  Physical Exam  Constitutional: He is oriented to person, place, and time. He appears well-developed and well-nourished.  HENT:  Head: Normocephalic.  Neck: Normal range of motion. Neck supple.  Cardiovascular: Normal rate.  Respiratory: Effort normal and breath sounds normal.  GI: Soft. Bowel sounds are normal.  Musculoskeletal: Normal range of motion. He exhibits no edema.  Neurological: He is alert and oriented to person, place, and time.  Skin: Skin is warm and dry.  Psychiatric: He has a normal mood and affect. His behavior is normal. Thought content normal.  Pt has cognitive deficits    Encounter Medications:   Outpatient Encounter Medications as of 08/19/2017  Medication Sig  . acetaminophen (TYLENOL) 325 MG tablet Take 2 tablets (650 mg total) by mouth every 6 (six) hours as needed for mild pain or headache.  [name] aspirin EC 81 MG tablet Take 1 tablet (81 mg total) by mouth daily.  [name] atorvastatin (LIPITOR) 80 MG tablet Take 1 tablet (80 mg total) by mouth daily at 6 PM.  . DULoxetine (CYMBALTA) 30 MG capsule Take 2 capsules (60 mg total) by mouth daily. WITH A FULL STOMACH AT SUPPER TIME  . fluticasone (FLONASE) 50 MCG/ACT nasal spray Place 2 sprays into both nostrils daily.  . fluticasone furoate-vilanterol (BREO ELLIPTA) 100-25 MCG/INH AEPB Inhale 1 puff into the lungs daily.  . isosorbide mononitrate (IMDUR) 30 MG 24  hr tablet Take 1 tablet (30 mg total) by mouth daily.  . metoprolol tartrate (LOPRESSOR) 25 MG tablet Take 1 tablet (25 mg total) by mouth 2 (two) times daily.  . nitroGLYCERIN (NITROSTAT) 0.4 MG SL tablet Place 1 tablet (0.4 mg total) under the tongue every 5 (five) minutes x 3 doses as needed for chest pain.  . pantoprazole (PROTONIX) 40 MG tablet Take 1 tablet (40 mg total) by mouth 2 (two) times daily.  [name] PROAIR HFA 108 (90 Base) MCG/ACT inhaler 1-2 puffs every 6 hours as needed wheezing or shortness of breath.  . tamsulosin (FLOMAX) 0.4 MG CAPS capsule Take 2 capsules (0.8 mg total) by mouth daily.   No facility-administered encounter medications on file as of 08/19/2017.     Functional Status:   In your present state of health, do you have any difficulty performing the following activities: 07/23/2017 07/19/2017  Hearing? N N  Vision? N N  Difficulty concentrating or making decisions? Y N  Walking or climbing stairs? Y Y  Dressing or bathing? N N  Doing errands, shopping? N N  Preparing Food and eating ? N N  Using the Toilet? N N  In the past six months, have you accidently leaked urine? N N  Do you have problems with loss of bowel control? N N  Managing your Medications? Y Y  Managing your Finances? [name]  Housekeeping or managing your Housekeeping? Y Y  Some recent data might be hidden  Fall/Depression Screening:    Fall Risk  07/23/2017 07/19/2017 04/26/2017  Falls in the past year? Yes Yes No  Number falls in past yr: 1 1 -  Injury with Fall? No No -  Risk for fall due to : History of fall(s);Medication side effect Impaired balance/gait;Impaired mobility -  Follow up Falls evaluation completed;Falls prevention discussed Falls prevention discussed -   PHQ 2/9 Scores 07/23/2017 07/19/2017 07/14/2017 04/26/2017 02/23/2017 02/09/2017 10/23/2016  PHQ - 2 Score 0 2 0 0 0 0 1  PHQ- 9 Score - 7 - - - - -    Assessment:  RN CM observed and reviewed medication with pt, sent in basket to [place] with update on PCS services.  THN CM Care Plan Problem One     Most Recent Value  Care Plan Problem One  Knowledge deficit related to coronary artery disease  Role Documenting the Problem One  Care Management Coordinator  Care Plan for Problem One  Active  THN Long Term Goal   Pt will verbalize/ demonstrate improved self care for coronary artery disease within 60 days  THN Long Term Goal Start Date  07/23/17  Interventions for Problem One Long Term Goal  RN CM observed and reviewed medications with pt (pt has prefilled med packs from [facility])  RN CM reivewed side effects of new medication imdur, pt has had headache and nausea since starting imdur, RN CM faxed note to cardiologist Dr. Rosezella [place] office reporting side effects, sent in basket to [facility] pharmacist Joetta Manners and reported side effects  THN CM Short Term Goal #1   pt will verbalize foods high in sodium to limit/ avoid within 30 days.  THN CM Short Term Goal #1 Start Date  07/23/17 [goal re-established]  Interventions for Short Term Goal #1  RN CM gave pt low sodium poster and reviewed, pt needs reinforcement  THN CM Short Term Goal #2   pt will verbalize/ demonstrate ways to improve heart health within 30 days.  THN CM Short Term Goal #2 Start Date  07/23/17  [facility] CM Short Term Goal #2 Met Date  08/19/17  Interventions for Short Term Goal #2  Pt verbalizes he is not smoking, states trying to "eat low sodium", is taking all medications as prescribed      Plan: call pt next week to assess symptoms (nausea, headache)  [name] [facility], [place] Coordinator [phone]

## 2017-08-25 ENCOUNTER — Other Ambulatory Visit: Payer: Self-pay | Admitting: Licensed Clinical Social Worker

## 2017-08-25 NOTE — Patient Outreach (Signed)
Assessment;  CSW spoke via phone with client. CSW verified client identity. CSW received verbal permission from client on 08/25/17 for CSW to speak with client about client needs. Client and CSW spoke of client care plan. CSW encouraged that client communicate with CSW in next 30 days to talk about community resources for transport assistance for client. Client also said that a nurse from [facility] visited him at his home recently and completed nurse assessment. Client said that a certified nursing assistant was scheduled to conduct visit with client tomorrow at home of client to provide client with needed care with activities of daily living. Client said he would talk with aide about meal preparation for client. Client said he also has trouble preparing his own meals. Client said he has  limited mobility with one of his arms. Thus, he said he needs occasional help in bathing.  Client has little family support. Client is receiving [facility] nursing support with RN [name]. Client's next appointment with Dr. Livia Snellen s on November 03, 2017.  Client said he has a pair of glasses at [facility] and he is planning to pick up his glasses at [facility].  CSW thanked client for phone call with CSW on 08/25/17   Plan:  Client to communicate with CSW in next 30 days to discuss community resources for transport  assistance for client.  CSW to collaborate with RN [name] in monitoring needs of client.  CSW to call client in next 30 days to assess client needs at that time.  [name].Shelbia Scinto MSW, [name] Licensed Clinical Social Worker [facility] Care Management [phone]

## 2017-08-26 ENCOUNTER — Encounter: Payer: Self-pay | Admitting: *Deleted

## 2017-08-26 ENCOUNTER — Other Ambulatory Visit: Payer: Self-pay | Admitting: *Deleted

## 2017-08-26 NOTE — Patient Outreach (Signed)
Telephone call to pt follow up on side effects new medication Imdur, no answer to telephone, left voicemail requesting return phone call.  PLAN Outreach pt in 3 business days  [name] [facility], BSN [facility] [employer] [phone]

## 2017-08-30 ENCOUNTER — Other Ambulatory Visit: Payer: Self-pay

## 2017-08-30 ENCOUNTER — Other Ambulatory Visit: Payer: Self-pay | Admitting: Family Medicine

## 2017-08-30 ENCOUNTER — Ambulatory Visit: Payer: Self-pay

## 2017-08-30 DIAGNOSIS — J441 Chronic obstructive pulmonary disease with (acute) exacerbation: Secondary | ICD-10-CM

## 2017-08-30 NOTE — Patient Outreach (Addendum)
[place] [place] [facility]) Care Management  08/30/2017  [name] [birth year] [id number]   Unsuccessful outreach attempt to Mr. Trantham to check on compliance packaging and to follow up on side effects on his new medication, Imdur.  HIPAA compliant voice mail left requesting return phone call.  Plan: Follow up on Wednesday 5/15.  Joetta Manners, PharmD Clinical Pharmacist [place] [phone]  Addendum: Incoming call received from Mr. Sens.  HIPAA identifiers verified.   Subjective: [name] reports carrying a bucket of rocks to fill in a hole in his yard yesterday and now he states that the "bones in my chest hurt."  He does not have any other complaints or symptoms other than pain. He reports that he called his heart doctor this morning to tell him what he had done and he is supposed to rest.  Mr. khyran riera that he knew he was not allowed to do anything strenous.  He reports that he is still experiencing drowsiness and is currently going to bed around 2030 and waking up at 1030am, along with napping during the day.  He reports that he used to enjoy waking up early and getting outside.  He requests that I let his cardiologist know about the drowsiness that may be from the newly added Imdur.  He also states that he needs his Proairl inhaler and nasal spray refilled.    Plan: Call for refills for Proair inhaler and Fluticasone nasal spray.  The pharmacy will deliver on Saturday with his compliance pill packs.  Will notify, cardiologist, Dr. Wolfgang Phoenix, of patient's bone pain in chest and drowsiness.  Follow up call to Mr. Geissinger next week.   Joetta Manners, PharmD Clinical Pharmacist [place] [phone]

## 2017-08-31 ENCOUNTER — Other Ambulatory Visit: Payer: Self-pay | Admitting: *Deleted

## 2017-08-31 NOTE — Patient Outreach (Signed)
RN CM called pt for follow up, spoke with pt, HIPAA verified, pt states he feels much better than he did, reports " I still have headaches sometimes"  Denies nausea.  Pt reports he is still taking imdur, reports he has all medications and taking as prescribed from prefilled packaging.  Pt states PCS services are to start this Friday, pt feels this will be beneficial, hopes they can help with errands.  Pt to see cardiologist in 2 weeks.  RN CM reviewed discharge plan with pt and will close case for nursing. Pt verbalizes he is grateful to [facility] for assisting with prefilled medications and assisting him with PCS services, states " I would be in a mess without all that" [facility] CSW and pharmacist continue to assist, RN CM sent in basket to both disciplines to inform of nursing discharge.    THN CM Care Plan Problem One     Most Recent Value  Care Plan Problem One  Knowledge deficit related to coronary artery disease  Role Documenting the Problem One  Care Management Coordinator  Care Plan for Problem One  Active  THN Long Term Goal   Pt will verbalize/ demonstrate improved self care for coronary artery disease within 60 days  THN Long Term Goal Start Date  07/23/17  [facility] Long Term Goal Met Date  08/31/17  Interventions for Problem One Long Term Goal  RN CM praised and encouraged pt for trying to make more nutritious food choices, pt to follow up with cardiologist in 2 weeks  THN CM Short Term Goal #1   pt will verbalize foods high in sodium to limit/ avoid within 30 days.  THN CM Short Term Goal #1 Start Date  07/23/17  [facility] CM Short Term Goal #1 Met Date  08/31/17  Interventions for Short Term Goal #1  Pt verbalizes he is using Ms. Dash and trying to be mindful of sodium intake, is rinsing some of his canned goods, pt has low sodium poster with pictures for reference and verbalizes understanding.  THN CM Short Term Goal #2   pt will verbalize/ demonstrate ways to improve heart health within 30 days.  THN CM  Short Term Goal #2 Start Date  07/23/17  [facility] CM Short Term Goal #2 Met Date  08/19/17      PLAN Close case for nursing only  [name] [facility], [place] Coordinator [phone]

## 2017-09-01 ENCOUNTER — Ambulatory Visit: Payer: Self-pay

## 2017-09-06 ENCOUNTER — Other Ambulatory Visit: Payer: Self-pay

## 2017-09-06 ENCOUNTER — Ambulatory Visit: Payer: Self-pay

## 2017-09-06 NOTE — Patient Outreach (Addendum)
[place] [facility]) Care Management  09/06/2017  [name] 1956-01-30 [id number]  Patient was called regarding medication adherence and pill packing. HIPAA identifiers were obtained. Patient is a 62 year old male with multiple medical conditions including but not limited to: Acute coronary syndrome, CAD, seizure disorder, hyperlipidemia, hypertension, generalized anxiety disorder, current smoker. He was recently hospitalized for chest painand left heart cath.   Successful outreach call placed to [name].  HIPAA identifiers verified.  Subjective: [name] reports that he is still drowsy and reports sleeping a lot.  He believes it is from one of his "white pills."  He reports being depressed because his family won't answer or return his phone calls.  He questions if his doctor can give him something to help with his depression.  He states that he does not want to hurt himself, but often repeats how tired he is and not like his himself.  He reports that the "bone pain" he had after carrying rocks last week is improved.  He states that the does not have any questions about his compliance packs and that all that is going well.  He reports that his new aide comes tomorrow.  Assessment: I informed [name] that the duloxetine he takes is for depression, but that I will let this doctors know that he is feeling depressed.  I tried to do the [place] 2 on him, but he would not give me an answer to my questions.   I could not make him understand what I was asking given his decreased cognitive function.    I think that [name] changes in mood and sedation may be related to isosorbide which was added on 08/12/17.  Plan: I will route my note to cardiology, Kerin Ransom, PA to make him aware of the possibility of unwanted side effects from Imdur.  Follow up with [name] in one week.  Joetta Manners, PharmD Clinical Pharmacist [place] [phone]  Addendum: Randa Ngo received from Kerin Ransom, [place] cardiology.  He did not think it was Imdur that was causing [name]'s fatigue.  He recommended to cut his Lopressor to 12.5 mg twice daily and his Lipitor back to 40 mg day.  He has also moved his next appointment up.  I will send in-basket request for him to call in new prescriptions to [facility].  Joetta Manners, PharmD Clinical Pharmacist [place] [phone]

## 2017-09-07 ENCOUNTER — Other Ambulatory Visit: Payer: Self-pay | Admitting: Cardiology

## 2017-09-07 ENCOUNTER — Telehealth: Payer: Self-pay

## 2017-09-07 ENCOUNTER — Telehealth: Payer: Self-pay | Admitting: Cardiology

## 2017-09-07 MED ORDER — ATORVASTATIN CALCIUM 80 MG PO TABS: 40.0000 mg | ORAL_TABLET | Freq: Every day | ORAL | 3 refills | Status: DC

## 2017-09-07 MED ORDER — METOPROLOL TARTRATE 25 MG PO TABS: 12.5000 mg | ORAL_TABLET | Freq: Two times a day (BID) | ORAL | 0 refills | Status: DC

## 2017-09-07 NOTE — Telephone Encounter (Signed)
Per Lurena Joiner called and got patient a sooner appointment Patient voiced understanding.

## 2017-09-07 NOTE — Telephone Encounter (Signed)
-----   Message from Erlene Quan, [place] sent at 09/07/2017  2:22 PM EDT ----- Can you see if you can move up Mr Froberg's appointment with me to some time in June.  Thanks  Kerin Ransom PA-C 09/07/2017 2:23 PM

## 2017-09-07 NOTE — Telephone Encounter (Signed)
Pt c/o weakness with any activity and says he feels worse after he takes his medications. I reviewed his chart and his medications and suggested he cut his Lopressor back to 12.5 mg BID and his Lipitor back to 40 mg daily (LDL was 41). I told him I did not think the Imdur was causing his symptoms. I'll see if I can move up his follow up appointment.   Kerin Ransom PA-C 09/07/2017 2:15 PM

## 2017-09-09 ENCOUNTER — Other Ambulatory Visit: Payer: Self-pay

## 2017-09-09 NOTE — Patient Outreach (Signed)
[place] [facility]) Care Management  09/09/2017  [name] [name] July 09, 1955 [id number]  Patient was called regarding medication adherence and pill packing. HIPAA identifiers were obtained. Patient is a 62 year old male with multiple medical conditions including but not limited to: Acute coronary syndrome, CAD, seizure disorder, hyperlipidemia, hypertension, generalized anxiety disorder, current smoker. He was recently hospitalized for chest painand left heart cath.   Successful outreach call placed to [name]. HIPAA identifiers verified.  Subjective: [name] reports that he is feeling much better since cardiology decreased his dose of metoprolol and atorvastatin.   He states that his aide has changed the doses in his current pill pack.  He reports going on a diet and says that his aide is helping him select healthier food options at the grocery store.  Patent states that he is very appreciative of [name] help.  Assessment: I verified with [name] that they are aware of the dosage changes for metoprolol and atorvastatin.  The changes will begin with the new pill pack he will start on Sunday 5/26.   Plan: Follow up with [name] in 2 weeks.  [name], PharmD Clinical Pharmacist [name] [phone]

## 2017-09-10 ENCOUNTER — Other Ambulatory Visit: Payer: Self-pay | Admitting: Licensed Clinical Social Worker

## 2017-09-10 ENCOUNTER — Other Ambulatory Visit: Payer: Self-pay

## 2017-09-10 NOTE — Patient Outreach (Addendum)
[place] [facility]) Care Management  09/10/2017  Paiton Fosco Fendley 1956/01/18 [id number]  Incoming call received from Mr. Tomlinson at 1634.  HIPAA identifiers verified.    Mr. Hicklin states that he is trying to get in touch with his [place].  He states that "I did something stupid."  He reports that he gave his aide, [name], his food stamps card yesterday because she said she was going to buy him some food.  He states he was expecting her today at 1330 and that she has not shown up.  He reports that she told him she would be back today with his food.   Call placed to [name], [name], who will follow up with patient.   Joetta Manners, PharmD Clinical Pharmacist [place] [phone]

## 2017-09-10 NOTE — Patient Outreach (Deleted)
[place] [facility]) Care Management  09/10/2017  [name] Testa 1955-08-26 [id number]   Outreach call placed to [place] at Dr. Lance Sell office to see if form for [name]' glucometer had been signed and returned to [facility].    Incoming call received from [place] at Dr. Lance Sell office and she said that she called [employer] and confirmed that [place] had picked up his glucometer this morning.   Plan: Will follow up with [name] about his glucose values next week.  Joetta Manners, PharmD Clinical Pharmacist [place] [phone]

## 2017-09-10 NOTE — Patient Outreach (Signed)
Assessment:  CSW received call from pharmacist Joetta Manners on 09/10/17. She informed CSW that client  had called her on 09/10/17 to report that client had given his food stamps card to home health  aide to buy food for him yesterday; and,  as of today, home health aide has not returned client's card to, him or any food purchased for client with this food stamps card of client. CSW called client on 09/10/17 and spoke via phone with client. CSW verified client identity. CSW received verbal permission from client on 09/10/17 for CSW to speak with client about client needs. Client said he gave food stamps card to home health aide, Margreta Journey, at [facility], on yesterday during the aides home visit with client . [name] He said he was expecting card to be returned along with food purchased by aide for him by this early afternoon. Client said he has not received his food stamps card back or any food purchased with it as of 5:00 PM on 09/10/17.  Client said that Margreta Journey, home health aide, starting working with client as a scheduled home health aide on Tuesday of this week. CSW checked directory assistance, 411, and home health agencies in area and could not locate a home health agency by that name on 09/10/17.  CSW had also previously tried to locate that agency for client several weeks ago and also had not been able to get a phone number or address for that agency at that time. CSW relayed this information to client on 09/10/17.   Client said he has a few numbers to call and would see if he could contact home health agency with any of the contact numbers he has at his home. Of note, client has literacy issues and it is hard for him to read or write. CSW was able to get client to slowly write down CSW phone number. CSW asked client to return call to [place] on Tuesday of next week to inform CSW if client received food or his food stamps card back by that time. Client agreed to call CSW at [phone] on Tuesday of  next week to discuss this situation of need further. CSW thanked client for phone call with CSW on 09/10/17.   Plan:  Client to call CSW at [phone] next Tuesday to discuss current situation of need related to his food stamps card.  CSW to call client as scheduled to assess client needs.  [name].Adalynd Donahoe MSW, [name] Licensed Clinical Social Worker [facility] Care Management [phone]

## 2017-09-14 ENCOUNTER — Other Ambulatory Visit: Payer: Self-pay

## 2017-09-14 NOTE — Patient Outreach (Addendum)
[facility]) Care Management  09/14/2017  [name] 1955/11/23 [id number]  Patient is a 62 year old male with multiple medical conditions including but not limited to: Acute coronary syndrome, CAD, seizure disorder, hyperlipidemia, hypertension, generalized anxiety disorder, h/o smoking. He was recently hospitalized for chest painand left heart cath.   Successful outreach call placed to [name]. HIPAA identifiers verified.  Subjective: [name] reports that his aide called late Friday afternoon to tell him that something had come up and that was why she did not come to his home as expected.  He reports that she purchased over $100 of food using his food stamp money and that he has his food stamp card back.  She delivered the food to him this weekend.  He states the aide is returning today at 12:30 to take him to his doctor's appointment.   [name] reports that he is upset because his older sister is in the hospital in [place].  He states that one of his other sisters has started to outreach to him again and he is glad.   [name] reports that he "feels like himself and he doesn't feel like he wants to lay in bed all day" since the doctor reduced his metoprolol dose.  He reports that he is doing well with the compliance packs and states that he has no questions about his medications.    Assessement: [name] seemed to be in much brighter spirits today.  The lack of communication with his family had been upsetting to him in the past.  The side effects from the higher dose metoprolol have subsided.  He verbalized appreciation of [facility]'s services.  I informed [name] that I am closing his [place] case as he has no further medication issues at this time.  He is aware that he can contact me in the future should medication issues arise.   Plan: Route case clossure letter to PCP and patient.  Inform [name], [name] of [facility] case closure.  Joetta Manners, PharmD Clinical Pharmacist [place] [phone]

## 2017-09-23 ENCOUNTER — Other Ambulatory Visit: Payer: Self-pay

## 2017-09-23 NOTE — Patient Outreach (Signed)
[facility]) Care Management  09/23/2017  [name] 02-10-1956 [id number]   CSW, [name], requested that BSW contact [place] to confirm patient's appointment for 11/11/17 @ 8:00.  Per Web designer, this appointment was changed to 10/08/17 @ 2:30.   BSW contacted [employer] to change date and time for transportation.  BSW attempted to contact patient but had to leave voicemail message.   New appointment date and time was communicated to [place], [place], [place] Social Worker [phone]

## 2017-09-24 ENCOUNTER — Ambulatory Visit: Payer: Self-pay

## 2017-09-24 ENCOUNTER — Other Ambulatory Visit: Payer: Self-pay | Admitting: Licensed Clinical Social Worker

## 2017-09-24 NOTE — Patient Outreach (Signed)
Assessment:  CSW called [employer] Services in [place], [place] on 09/24/17 and spoke with representative of that agency about client situation regarding home health aide. Client had shared with CSW on 09/24/17 that home health aide, named Emmie, had gone with client to [employer] in [place] recently. Client wanted to buy himself a cell phone. Client said aide begged him to buy her a cell phone too. He said he was confused and does have literacy issues of note. He said he paid $ 220.00 for two cell phones, one for himself and one for aide.  He said representative of [employer] put cell phone in name of home health aide. This was information from client. Client was very upset about this situation. CSW shared the above information with Adult Protective Services representative on 09/24/17. Representative also asked CSW questions about client living arrangement, home safety and care in the home for client. Representative said she would share this information with her supervisor. CSW gave representative my name, agency ([facility]), my credential ([name]) and my phone number [phone]). CSW thanked representative for phone call with CSW on 09/24/17.   Plan:  Representative of Adult Protective Services to convey this information regarding client to representative's supervisor.  [name].Terald Jump MSW, [name] Licensed Clinical Social Worker [facility] Care Management [phone]

## 2017-09-24 NOTE — Patient Outreach (Signed)
Assessment:  CSW spoke via phone with client . CSW verified client identity. CSW received verbal permission from client for CSW to speak with client about client needs.CSW reminded client that his appointment with cardiologist had been moved up to October 08, 2017 at 2:30 PM with cardiologist. [name], social worker has arranged for [employer] transport agency to pick up client at home of client on 10/08/17 at 1:30 PM to transport client to and from this scheduled medical appointment. CSW informed client of this transport information on 09/24/17.  Client does have literacy issues and has difficulty remembering appointments, dates and times.  CSW also spoke with client about client care plan. CSW encouraged that client continue to talk with CSW in next 30 days about community resources for transportation assistance for client. Client is trying to attend scheduled client medical appointments. He has minimal family support.  [facility] nursing program has discharged client from nursing services. [facility] pharmacy program has discharged client from [place] services. CSW encouraged client to call CSW as needed to discuss social work needs of client.    Plan:  Client to continue to talk with CSW in next 30 days about community resources for transportation assistance for client.    CSW to call client in 4 weeks to assess client needs at that time.  [name].Dejohn Ibarra MSW, [name] Licensed Clinical Social Worker [facility] Care Management [phone]

## 2017-09-27 ENCOUNTER — Other Ambulatory Visit: Payer: Self-pay | Admitting: Licensed Clinical Social Worker

## 2017-09-27 NOTE — Patient Outreach (Signed)
[name]) Hale Management  [name] Hale Social Work  09/27/2017  [name] Dec 07, 1955 [id number]  Subjective:    Objective:   Encounter Medications:  Outpatient Encounter Medications as of 09/27/2017  Medication Sig  . acetaminophen (TYLENOL) 325 MG tablet Take 2 tablets (650 mg total) by mouth every 6 (six) hours as needed for mild pain or headache.  [name] aspirin EC 81 MG tablet Take 1 tablet (81 mg total) by mouth daily.  [name] atorvastatin (LIPITOR) 80 MG tablet Take 0.5 tablets (40 mg total) by mouth daily at 6 PM.  . DULoxetine (CYMBALTA) 30 MG capsule Take 2 capsules (60 mg total) by mouth daily. WITH A FULL STOMACH AT SUPPER TIME  . fluticasone (FLONASE) 50 MCG/ACT nasal spray Place 2 sprays into both nostrils daily.  . fluticasone (FLONASE) 50 MCG/ACT nasal spray USE 2 SPRAYS IN EACH NOSTRIL ONCE DAILY.  . fluticasone furoate-vilanterol (BREO ELLIPTA) 100-25 MCG/INH AEPB Inhale 1 puff into the lungs daily.  . isosorbide mononitrate (IMDUR) 30 MG 24 hr tablet Take 1 tablet (30 mg total) by mouth daily.  . metoprolol tartrate (LOPRESSOR) 25 MG tablet Take 0.5 tablets (12.5 mg total) by mouth 2 (two) times daily.  . nitroGLYCERIN (NITROSTAT) 0.4 MG SL tablet Place 1 tablet (0.4 mg total) under the tongue every 5 (five) minutes x 3 doses as needed for chest pain.  . pantoprazole (PROTONIX) 40 MG tablet Take 1 tablet (40 mg total) by mouth 2 (two) times daily.  [name] PROAIR HFA 108 (90 Base) MCG/ACT inhaler 1-2 puffs every 6 hours as needed wheezing or shortness of breath.  . tamsulosin (FLOMAX) 0.4 MG CAPS capsule Take 2 capsules (0.8 mg total) by mouth daily.   No facility-administered encounter medications on file as of 09/27/2017.     Functional Status:  In your present state of health, do you have any difficulty performing the following activities: 07/23/2017 07/19/2017  Hearing? N N  Vision? N N  Difficulty concentrating or making decisions? Y N  Walking or climbing  stairs? Y Y  Dressing or bathing? N N  Doing errands, shopping? N N  Preparing Food and eating ? N N  Using the Toilet? N N  In the past six months, have you accidently leaked urine? N N  Do you have problems with loss of bowel control? N N  Managing your Medications? Y Y  Managing your Finances? [name]  Housekeeping or managing your Housekeeping? [name]  Some recent data might be hidden    Fall/Depression Screening:  PHQ 2/9 Scores 07/23/2017 07/19/2017 07/14/2017 04/26/2017 02/23/2017 02/09/2017 10/23/2016  PHQ - 2 Score 0 2 0 0 0 0 1  PHQ- 9 Score - 7 - - - - -    Assessment:   CSW traveled to home of client on 09/27/17. CSW met with client on 09/27/17 at home of  client for a routine home visit. Client has pet dog and enjoys spending time with pet dog.  CSW verified client identity. CSW received verbal permission from client for CSW to speak with client about client needs. Client receives in home help from [name]. CSW informed [name] that CSW had talked with Adult Protective Services about client recent phone concerns. CSW also informed [name] that [name] talked via phone on 09/27/17  with nursing represenative of [name] about client;s recent phone concern issues.  [name] informed CSW on 09/27/17 that home health aide came out to see him recenlty and paid him $  220.00 to cover the costs of phone purchased by client recenlty. Client said he receives his medications as scheduled [name].  Client said that his sister, [name], is calling him regularly to talk witih him about client needs. Client said that in home aide support is very helpful to client at present. Client asked if Dr. Livia Snellen could request additional hours for client for in home Hale. CSW called [name] on 09/27/17 and inquired about process for extra in home hours for client . Represenative of [place] said that primary doctor for client needed to complete form [name] and send completed  form to [name] . Upon reception of form , [name] will review form and determine if another RN home visit is needed with client.   CSW called [name] on 09/27/17 and spoke with RN about client request.  CSW informed RN that [name] requested primary doctor to complete form DMA 3051 and send completed form to [name] for review. RN at practice said she would convey this information to Dr. Claretta Hale, primary doctor for client, for Dr. Artemio Hale consideration.  Client has needed food items.  Home Health aide has helped client with getting needed food items.  Client said he was not having any current pain issues. Regarding client Hale plan, CSW encouraged client to communicate with CSW in next 30 days to discuss community resources for transport assistance for client. CSW thanked client for allowing CSW to visit client at home of clinete on 09/27/17.  Plan:   Client to communicate with CSW in next 30 days about community resources for transport assistance for client..  CSW to call client as scheduled to assess client needs.  [name].[name] MSW, [name] Licensed Clinical Social Worker [facility] Hale Management [phone]

## 2017-10-04 ENCOUNTER — Other Ambulatory Visit: Payer: Self-pay | Admitting: Licensed Clinical Social Worker
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
## 2017-10-05 ENCOUNTER — Other Ambulatory Visit: Payer: Self-pay | Admitting: Family Medicine

## 2017-10-05 DIAGNOSIS — I10 Essential (primary) hypertension: Secondary | ICD-10-CM

## 2017-10-08 ENCOUNTER — Encounter: Payer: Self-pay | Admitting: Cardiology

## 2017-10-08 ENCOUNTER — Telehealth: Payer: Self-pay | Admitting: Cardiology

## 2017-10-08 ENCOUNTER — Ambulatory Visit (INDEPENDENT_AMBULATORY_CARE_PROVIDER_SITE_OTHER): Payer: Medicare Other | Admitting: Cardiology

## 2017-10-08 VITALS — BP 118/74 | HR 72 | Ht 72.0 in | Wt 222.0 lb

## 2017-10-08 DIAGNOSIS — G809 Cerebral palsy, unspecified: Secondary | ICD-10-CM

## 2017-10-08 DIAGNOSIS — I10 Essential (primary) hypertension: Secondary | ICD-10-CM | POA: Diagnosis not present

## 2017-10-08 DIAGNOSIS — F17200 Nicotine dependence, unspecified, uncomplicated: Secondary | ICD-10-CM

## 2017-10-08 DIAGNOSIS — E785 Hyperlipidemia, unspecified: Secondary | ICD-10-CM

## 2017-10-08 DIAGNOSIS — I2510 Atherosclerotic heart disease of native coronary artery without angina pectoris: Secondary | ICD-10-CM | POA: Diagnosis not present

## 2017-10-08 NOTE — Telephone Encounter (Signed)
The patient came in today for a follow up appointment with Kerin Ransom, PA. The patient had complaints of his former nurse aide and needed a new [place]. Per the case worker, the patient needed help to call [facility] and get this established. The patient was able to voice his concerns to the staff at [facility] and he requested a new agency. He was informed that [facility] would be in touch in 48-72 hours. Scott from [facility] has been called an notified of the update.

## 2017-10-08 NOTE — Assessment & Plan Note (Signed)
Chronic smoker 

## 2017-10-08 NOTE — Assessment & Plan Note (Signed)
Pt is mentally challenged and illiterate. He also has weakness in his left arm that limits his ability to bath himself

## 2017-10-08 NOTE — Telephone Encounter (Signed)
Spoke to [employer] if Industrial/product designer- can assist patient. PATIENT NEEDS HELP WITH ADL'S 949 Patient needs to call  [id number] [phone] -  and request  " Reassignment oF home health agency"   From [facility] to either  1) [facility]  OR 2)  [facility]  PATIENT WILL NEED TO GIVE HIS  SS# AND MEDICAID # [id number] N to [facility].  Per Scott,he states he attempted to assist patient over the phone,but was able to comprehend the steps. RN informed Nicki Reaper will notify nurse- she will try to assist
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
## 2017-10-08 NOTE — Telephone Encounter (Signed)
New Message:       [facility] [name] is calling and wanting to give an update on the pt before the pt's appt today with [facility]. Pt's appt is at 2:30 so he would definitely like for you to call him back before then.

## 2017-10-08 NOTE — Assessment & Plan Note (Signed)
Controlled.  

## 2017-10-08 NOTE — Patient Instructions (Signed)
Medication Instructions: Your physician recommends that you continue on your current medications as directed. Please refer to the Current Medication list given to you today.  If you need a refill on your cardiac medications before your next appointment, please call your pharmacy.   Labwork: Your provider would like for you to have the following labs today: FASTING lipid and CMET  Follow-Up: Your physician wants you to follow-up in 6 months with Jana Half You will receive a reminder letter in the mail two months in advance. If you don't receive a letter, please call our office at [phone] to schedule this follow-up appointment.  Thank you for choosing [facility] at [facility]!!

## 2017-10-08 NOTE — Assessment & Plan Note (Signed)
Complex bifurcation 70% LAD- March 2019- medical Rx Currently doing well on medical rx

## 2017-10-08 NOTE — Assessment & Plan Note (Signed)
Check CMET and lipids today

## 2017-10-09 LAB — LIPID PANEL
Chol/HDL Ratio: 2.8 ratio (ref 0.0–5.0)
Cholesterol, Total: 77 mg/dL — ABNORMAL LOW (ref 100–199)
HDL: 28 mg/dL — ABNORMAL LOW (ref >39)
LDL Calculated: 25 mg/dL (ref 0–99)
Triglycerides: 119 mg/dL (ref 0–149)
VLDL Cholesterol Cal: 24 mg/dL (ref 5–40)

## 2017-10-09 LAB — COMPREHENSIVE METABOLIC PANEL
ALT: 12 IU/L (ref 0–44)
AST: 13 IU/L (ref 0–40)
Albumin/Globulin Ratio: 1.3 (ref 1.2–2.2)
Albumin: 4.4 g/dL (ref 3.5–5.5)
Alkaline Phosphatase: 83 IU/L (ref 39–117)
BUN/Creatinine Ratio: 16 (ref 9–20)
BUN: 12 mg/dL (ref 6–24)
Bilirubin Total: 0.8 mg/dL (ref 0.0–1.2)
CO2: 28 mmol/L (ref 20–29)
Calcium: 9.5 mg/dL (ref 8.7–10.2)
Chloride: 97 mmol/L (ref 96–106)
Creatinine, Ser: 0.75 mg/dL — ABNORMAL LOW (ref 0.76–1.27)
GFR calc Af Amer: 116 mL/min/{1.73_m2} (ref >59)
GFR calc non Af Amer: 100 mL/min/{1.73_m2} (ref >59)
Globulin, Total: 3.3 g/dL (ref 1.5–4.5)
Glucose: 77 mg/dL (ref 65–99)
Potassium: 3.9 mmol/L (ref 3.5–5.2)
Sodium: 138 mmol/L (ref 134–144)
Total Protein: 7.7 g/dL (ref 6.0–8.5)

## 2017-10-11 ENCOUNTER — Other Ambulatory Visit: Payer: Self-pay

## 2017-10-11 NOTE — Patient Outreach (Signed)
[facility]) Care Management  10/11/2017  [name] [birth year] [id number]   BSW received request from [place], [name], on 10/08/17 to arrange transportation to patient's upcoming appointment with Dr. [name] on 10/13/17 @ 2:10 PM.  Transportation was arranged via [employer] and patient was informed that he will be picked up at 1:40 PM for this appointment.    [name], BSW Social Worker [phone]

## 2017-10-12 ENCOUNTER — Other Ambulatory Visit: Payer: Self-pay | Admitting: Licensed Clinical Social Worker
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
## 2017-10-13 ENCOUNTER — Encounter: Payer: Self-pay | Admitting: Family Medicine

## 2017-10-13 ENCOUNTER — Other Ambulatory Visit: Payer: Self-pay | Admitting: Licensed Clinical Social Worker

## 2017-10-13 ENCOUNTER — Ambulatory Visit (INDEPENDENT_AMBULATORY_CARE_PROVIDER_SITE_OTHER): Payer: Medicare Other | Admitting: Family Medicine

## 2017-10-13 VITALS — BP 96/53 | HR 82 | Temp 98.1°F | Ht 72.0 in | Wt 225.4 lb

## 2017-10-13 DIAGNOSIS — R531 Weakness: Secondary | ICD-10-CM | POA: Diagnosis not present

## 2017-10-13 DIAGNOSIS — G809 Cerebral palsy, unspecified: Secondary | ICD-10-CM

## 2017-10-13 DIAGNOSIS — R482 Apraxia: Secondary | ICD-10-CM | POA: Insufficient documentation

## 2017-10-13 DIAGNOSIS — F411 Generalized anxiety disorder: Secondary | ICD-10-CM

## 2017-10-13 DIAGNOSIS — Z550 Illiteracy and low-level literacy: Secondary | ICD-10-CM | POA: Diagnosis not present
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
## 2017-10-28 ENCOUNTER — Other Ambulatory Visit: Payer: Self-pay | Admitting: Licensed Clinical Social Worker

## 2017-10-28 NOTE — Patient Outreach (Signed)
Assessment:  CSW spoke via phone with client. CSW verified client identtiy. CSW received verbal permission from client for CSW to speak with client about client needs. Client calls CSW frequently. CSW has helped client on referral process for in home aide assistance for client. Client said that representative of [employer] called client today to set up appointment to visit client related to home health current needs of client. Client said also he has adequate food supply at present. Client said he is trying to work out an agreement with [facility] related to financial loss of client during care received by client from former home health aide. Client said he has a friend who sometimes takes client to the store to obtain needed food items for client. CSW spoke with client about client care plan. CSW encouraged client to communicate with CSW in next 30 days as needed to discuss community resources for transport assistance for client. CSW thanked client for phone call with CSW. CSW encouraged client to call CSW as needed to discuss social work needs of client.    Plan:  Client to communicate with CSW in next 30 days as needed to discuss community resources for transport assistance for client.   CSW to call client in 4 weeks to assess client needs at that time.  [name].Danyela Posas MSW, [name] Licensed Clinical Social Worker [facility] Care Management [phone]

## 2017-10-29 ENCOUNTER — Ambulatory Visit: Payer: Self-pay | Admitting: Licensed Clinical Social Worker

## 2017-11-01 ENCOUNTER — Other Ambulatory Visit: Payer: Self-pay

## 2017-11-01 NOTE — Patient Outreach (Signed)
[facility]) Care Management  11/01/2017  [name] January 27, 1956 [id number]  Transportation request received from [place], [name], for appointment with Dr. [name] on 11/03/17  @ 12:15.  Appointment arranged via [employer] and communicated to patient.    [name], BSW Social Worker [phone]

## 2017-11-03 ENCOUNTER — Encounter: Payer: Self-pay | Admitting: Family Medicine

## 2017-11-03 ENCOUNTER — Ambulatory Visit (INDEPENDENT_AMBULATORY_CARE_PROVIDER_SITE_OTHER): Payer: Medicare Other | Admitting: Family Medicine

## 2017-11-03 VITALS — BP 130/78 | HR 90 | Temp 98.5°F | Ht 72.0 in | Wt 223.5 lb

## 2017-11-03 DIAGNOSIS — R531 Weakness: Secondary | ICD-10-CM

## 2017-11-03 DIAGNOSIS — Z125 Encounter for screening for malignant neoplasm of prostate: Secondary | ICD-10-CM

## 2017-11-03 DIAGNOSIS — G809 Cerebral palsy, unspecified: Secondary | ICD-10-CM

## 2017-11-03 DIAGNOSIS — G44209 Tension-type headache, unspecified, not intractable: Secondary | ICD-10-CM | POA: Diagnosis not present

## 2017-11-03 DIAGNOSIS — I10 Essential (primary) hypertension: Secondary | ICD-10-CM

## 2017-11-03 DIAGNOSIS — I2510 Atherosclerotic heart disease of native coronary artery without angina pectoris: Secondary | ICD-10-CM | POA: Diagnosis not present

## 2017-11-03 DIAGNOSIS — E785 Hyperlipidemia, unspecified: Secondary | ICD-10-CM | POA: Diagnosis not present

## 2017-11-03 DIAGNOSIS — N400 Enlarged prostate without lower urinary tract symptoms: Secondary | ICD-10-CM | POA: Diagnosis not present

## 2017-11-03 DIAGNOSIS — I252 Old myocardial infarction: Secondary | ICD-10-CM

## 2017-11-03 DIAGNOSIS — F411 Generalized anxiety disorder: Secondary | ICD-10-CM | POA: Diagnosis not present

## 2017-11-03 MED ORDER — METOPROLOL TARTRATE 25 MG PO TABS: 25.0000 mg | ORAL_TABLET | Freq: Two times a day (BID) | ORAL | 1 refills | Status: DC

## 2017-11-03 NOTE — Progress Notes (Signed)
Subjective:  Patient ID: [name], male    DOB: November 15, 1955  Age: 62 y.o. MRN: [id number]  CC: Medical Management of Chronic Issues   HPI [name] presents for follow-up of hypertension. Patient has no history of headache chest pain or shortness of breath or recent cough. Patient also denies symptoms of TIA such as numbness weakness lateralizing. Patient checks  blood pressure at home and has not had any elevated readings recently. Patient denies side effects from his medication. States taking it regularly. Patient in for follow-up of elevated cholesterol. Doing well without complaints on current medication. Denies side effects of statin including myalgia and arthralgia and nausea. Also in today for liver function testing. Currently no chest pain, shortness of breath or other cardiovascular related symptoms noted.  Patient denies recent chest pain.  He is excited about the fact that he has 2 aids now instead of 1.  They are helping him with all of his ADLs including some light housecleaning and cooking.  They have been helping some with grooming.  He has trouble cleaning himself on the right side of his body due to the deformity of his left upper extremity.  He even got his money back that he spent on buying the 2 telephones that were mentioned in his previous visit.  This has led to a significant decrease in his anxiety level.  Its helping him to cope better with his cerebral palsy deformities as well.  Patient continues to use his inhalers without any problems with shortness of breath recently.  He stays in out of the heat as much as possible however.  He has not had to use any nitroglycerin recently.  He notes that for the last several days he wakes up with a headache.  He feels that 1 of the medicines he takes at bedtime is causing it.  Of note is that isosorbide is 1 of those medicines that he is suspicious of.  Depression screen [facility] 2/9 11/03/2017 07/23/2017 07/19/2017  Decreased  Interest 0 0 1  Down, Depressed, Hopeless 0 0 1  PHQ - 2 Score 0 0 2  Altered sleeping - - 1  Tired, decreased energy - - 1  Change in appetite - - 1  Feeling bad or failure about yourself  - - 0  Trouble concentrating - - 1  Moving slowly or fidgety/restless - - 1  Suicidal thoughts - - 0  PHQ-9 Score - - 7  Difficult doing work/chores - - Somewhat difficult  Some recent data might be hidden    History [name] has a past medical history of Cerebral palsy ([place]), Coronary artery disease, Depression, GERD (gastroesophageal reflux disease), Hypertension, Myocardial infarction ([place]) (2008), Scoliosis, and Seizures ([place]).   He has a past surgical history that includes Coronary angioplasty with stent; Carpal tunnel release (Right, 08/13/2016); and LEFT HEART CATH AND CORONARY ANGIOGRAPHY (N/A, 07/10/2017).   His family history includes Alcohol abuse in his brother; CAD in his brother; CAD (age of onset: 51) in his father; Diabetes in his mother; Heart disease in his father.He reports that he has been smoking cigarettes.  He has been smoking about 1.00 pack per day. He has never used smokeless tobacco. He reports that he drinks alcohol. He reports that he does not use drugs.    ROS Review of Systems  Constitutional: Negative.   HENT: Negative.   Eyes: Negative for visual disturbance.  Respiratory: Positive for shortness of breath (Well-controlled with current regimen.). Negative for cough.  Cardiovascular: Negative for chest pain and leg swelling.  Gastrointestinal: Negative for abdominal pain, diarrhea, nausea and vomiting.  Genitourinary: Negative for difficulty urinating.  Musculoskeletal: Negative for arthralgias and myalgias.  Skin: Negative for rash.  Neurological: Positive for headaches (These are fairly new in onset and are midline above the eyes between the eyes and severe lasting about an hour each morning resolving spontaneously).  Psychiatric/Behavioral: Negative for sleep  disturbance. The patient is nervous/anxious (Well-controlled with the improvement in his circumstances and use of current regimen of medication noted below).     Objective:  BP 130/78   Pulse 90   Temp 98.5 F (36.9 C) (Oral)   Ht 6' (1.829 m)   Wt 223 lb 8 oz (101.4 kg)   BMI 30.31 kg/m   BP Readings from Last 3 Encounters:  11/03/17 130/78  10/13/17 (!) 96/53  10/08/17 118/74    Wt Readings from Last 3 Encounters:  11/03/17 223 lb 8 oz (101.4 kg)  10/13/17 225 lb 6 oz (102.2 kg)  10/08/17 222 lb (100.7 kg)     Physical Exam  Constitutional: He is oriented to person, place, and time. He appears well-developed and well-nourished. No distress.  HENT:  Head: Normocephalic and atraumatic.  Right Ear: External ear normal.  Left Ear: External ear normal.  Nose: Nose normal.  Mouth/Throat: Oropharynx is clear and moist.  Eyes: Pupils are equal, round, and reactive to light. Conjunctivae and EOM are normal.  Neck: Normal range of motion. Neck supple.  Cardiovascular: Normal rate, regular rhythm and normal heart sounds.  No murmur heard. Pulmonary/Chest: Effort normal and breath sounds normal. No respiratory distress. He has no wheezes. He has no rales.  Abdominal: Soft. There is no tenderness.  Musculoskeletal: Normal range of motion. He exhibits deformity (Left upper extremity as previously noted.  Diminished range of motion at the elbow wrist and hand).  Neurological: He is alert and oriented to person, place, and time. He has normal reflexes.  Skin: Skin is warm and dry.  Psychiatric: He has a normal mood and affect. His behavior is normal. Judgment and thought content normal.      Assessment & Plan:   [name] was seen today for medical management of chronic issues.  Diagnoses and all orders for this visit:  Essential hypertension -     CBC with Differential/Platelet -     CMP14+EGFR -     metoprolol tartrate (LOPRESSOR) 25 MG tablet; Take 1 tablet (25 mg total)  by mouth 2 (two) times daily.  Infantile cerebral palsy (HCC)  History of MI (myocardial infarction)  Dyslipidemia, goal LDL below 70 -     CBC with Differential/Platelet -     CMP14+EGFR -     Lipid panel  GAD (generalized anxiety disorder) -     CMP14+EGFR  Screening for prostate cancer -     PSA, total and free  Coronary artery disease involving native coronary artery of native heart without angina pectoris -     metoprolol tartrate (LOPRESSOR) 25 MG tablet; Take 1 tablet (25 mg total) by mouth 2 (two) times daily.  Acute non intractable tension-type headache -     CBC with Differential/Platelet -     CMP14+EGFR  Left-sided weakness   Due to concern over his headache I have discontinued his isosorbide.  Since his heart rate tends to run a little high, I think we can compensate in the area of angina prevention with an increase in his metoprolol dose he will  still be on a rather low dose even with the increase.    I have discontinued [name]. [name]'s isosorbide mononitrate. I have also changed his metoprolol tartrate. Additionally, I am having him maintain his aspirin EC, acetaminophen, DULoxetine, fluticasone furoate-vilanterol, nitroGLYCERIN, pantoprazole, PROAIR HFA, tamsulosin, fluticasone, lisinopril, and atorvastatin.  Allergies as of 11/03/2017   No Known Allergies     Medication List        Accurate as of 11/03/17  2:40 PM. Always use your most recent med list.          acetaminophen 325 MG tablet Commonly known as:  TYLENOL Take 2 tablets (650 mg total) by mouth every 6 (six) hours as needed for mild pain or headache.   aspirin EC 81 MG tablet Take 1 tablet (81 mg total) by mouth daily.   atorvastatin 40 MG tablet Commonly known as:  LIPITOR   DULoxetine 30 MG capsule Commonly known as:  CYMBALTA Take 2 capsules (60 mg total) by mouth daily. WITH A FULL STOMACH AT SUPPER TIME   fluticasone 50 MCG/ACT nasal spray Commonly known as:  FLONASE USE 2  SPRAYS IN EACH NOSTRIL ONCE DAILY.   fluticasone furoate-vilanterol 100-25 MCG/INH Aepb Commonly known as:  BREO ELLIPTA Inhale 1 puff into the lungs daily.   lisinopril 20 MG tablet Commonly known as:  PRINIVIL,ZESTRIL TAKE 1 TABLET DAILY   metoprolol tartrate 25 MG tablet Commonly known as:  LOPRESSOR Take 1 tablet (25 mg total) by mouth 2 (two) times daily.   nitroGLYCERIN 0.4 MG SL tablet Commonly known as:  NITROSTAT Place 1 tablet (0.4 mg total) under the tongue every 5 (five) minutes x 3 doses as needed for chest pain.   pantoprazole 40 MG tablet Commonly known as:  PROTONIX Take 1 tablet (40 mg total) by mouth 2 (two) times daily.   PROAIR HFA 108 (90 Base) MCG/ACT inhaler Generic drug:  albuterol 1-2 puffs every 6 hours as needed wheezing or shortness of breath.   tamsulosin 0.4 MG Caps capsule Commonly known as:  FLOMAX Take 2 capsules (0.8 mg total) by mouth daily.        Follow-up: Return in about 6 months (around 05/06/2018) for hypertension, cholesterol, COPD, CAD.  [name], M.D.

## 2017-11-03 NOTE — Patient Instructions (Signed)

## 2017-11-04 LAB — CMP14+EGFR
ALT: 12 IU/L (ref 0–44)
AST: 13 IU/L (ref 0–40)
Albumin/Globulin Ratio: 1.2 (ref 1.2–2.2)
Albumin: 4.1 g/dL (ref 3.5–5.5)
Alkaline Phosphatase: 76 IU/L (ref 39–117)
BUN/Creatinine Ratio: 16 (ref 9–20)
BUN: 13 mg/dL (ref 6–24)
Bilirubin Total: 0.9 mg/dL (ref 0.0–1.2)
CO2: 32 mmol/L — ABNORMAL HIGH (ref 20–29)
Calcium: 9.6 mg/dL (ref 8.7–10.2)
Chloride: 97 mmol/L (ref 96–106)
Creatinine, Ser: 0.83 mg/dL (ref 0.76–1.27)
GFR calc Af Amer: 111 mL/min/{1.73_m2} (ref >59)
GFR calc non Af Amer: 96 mL/min/{1.73_m2} (ref >59)
Globulin, Total: 3.3 g/dL (ref 1.5–4.5)
Glucose: 83 mg/dL (ref 65–99)
Potassium: 4.4 mmol/L (ref 3.5–5.2)
Sodium: 144 mmol/L (ref 134–144)
Total Protein: 7.4 g/dL (ref 6.0–8.5)

## 2017-11-04 LAB — PSA, TOTAL AND FREE
PSA, Free Pct: 56.7 %
PSA, Free: 0.17 ng/mL
Prostate Specific Ag, Serum: 0.3 ng/mL (ref 0.0–4.0)

## 2017-11-04 LAB — CBC WITH DIFFERENTIAL/PLATELET
Basophils Absolute: 0.1 10*3/uL (ref 0.0–0.2)
Basos: 0 %
EOS (ABSOLUTE): 1.1 10*3/uL — ABNORMAL HIGH (ref 0.0–0.4)
Eos: 8 %
Hematocrit: 45.7 % (ref 37.5–51.0)
Hemoglobin: 15.8 g/dL (ref 13.0–17.7)
Immature Grans (Abs): 0 10*3/uL (ref 0.0–0.1)
Immature Granulocytes: 0 %
Lymphocytes Absolute: 3.7 10*3/uL — ABNORMAL HIGH (ref 0.7–3.1)
Lymphs: 29 %
MCH: 34.5 pg — ABNORMAL HIGH (ref 26.6–33.0)
MCHC: 34.6 g/dL (ref 31.5–35.7)
MCV: 100 fL — ABNORMAL HIGH (ref 79–97)
Monocytes Absolute: 0.9 10*3/uL (ref 0.1–0.9)
Monocytes: 7 %
Neutrophils Absolute: 7.2 10*3/uL — ABNORMAL HIGH (ref 1.4–7.0)
Neutrophils: 56 %
Platelets: 243 10*3/uL (ref 150–450)
RBC: 4.58 x10E6/uL (ref 4.14–5.80)
RDW: 13.7 % (ref 12.3–15.4)
WBC: 12.9 10*3/uL — ABNORMAL HIGH (ref 3.4–10.8)

## 2017-11-04 LAB — LIPID PANEL
Chol/HDL Ratio: 3.1 ratio (ref 0.0–5.0)
Cholesterol, Total: 104 mg/dL (ref 100–199)
HDL: 34 mg/dL — ABNORMAL LOW (ref >39)
LDL Calculated: 44 mg/dL (ref 0–99)
Triglycerides: 129 mg/dL (ref 0–149)
VLDL Cholesterol Cal: 26 mg/dL (ref 5–40)

## 2017-11-08 ENCOUNTER — Telehealth: Payer: Self-pay | Admitting: Family Medicine

## 2017-11-08 NOTE — Telephone Encounter (Signed)
Patient is concerned because he is having headaches.  I reviewed his chart and he had talked with Dr. Livia Snellen about this at his last visit on 11/03/17.  At that time, Dr. Livia Snellen had discontinued his Isosorbide.  I reassured the patient, and advised him to give it a few more days/week and if he continued to have headaches, to contact our office.

## 2017-11-10 ENCOUNTER — Other Ambulatory Visit: Payer: Self-pay | Admitting: Licensed Clinical Social Worker

## 2017-11-10 NOTE — Patient Outreach (Signed)
[place] [facility]) Care Management  [facility] Social Work  11/10/2017  [name] [birth year] [id number]  Subjective:    Objective:   Encounter Medications:  Outpatient Encounter Medications as of 11/10/2017  Medication Sig  . acetaminophen (TYLENOL) 325 MG tablet Take 2 tablets (650 mg total) by mouth every 6 (six) hours as needed for mild pain or headache.  [name] aspirin EC 81 MG tablet Take 1 tablet (81 mg total) by mouth daily.  [name] atorvastatin (LIPITOR) 40 MG tablet   . DULoxetine (CYMBALTA) 30 MG capsule Take 2 capsules (60 mg total) by mouth daily. WITH A FULL STOMACH AT SUPPER TIME  . fluticasone (FLONASE) 50 MCG/ACT nasal spray USE 2 SPRAYS IN EACH NOSTRIL ONCE DAILY.  . fluticasone furoate-vilanterol (BREO ELLIPTA) 100-25 MCG/INH AEPB Inhale 1 puff into the lungs daily.  [name] lisinopril (PRINIVIL,ZESTRIL) 20 MG tablet TAKE 1 TABLET DAILY  . metoprolol tartrate (LOPRESSOR) 25 MG tablet Take 1 tablet (25 mg total) by mouth 2 (two) times daily.  . nitroGLYCERIN (NITROSTAT) 0.4 MG SL tablet Place 1 tablet (0.4 mg total) under the tongue every 5 (five) minutes x 3 doses as needed for chest pain.  . pantoprazole (PROTONIX) 40 MG tablet Take 1 tablet (40 mg total) by mouth 2 (two) times daily.  [name] PROAIR HFA 108 (90 Base) MCG/ACT inhaler 1-2 puffs every 6 hours as needed wheezing or shortness of breath.  . tamsulosin (FLOMAX) 0.4 MG CAPS capsule Take 2 capsules (0.8 mg total) by mouth daily.   No facility-administered encounter medications on file as of 11/10/2017.     Functional Status:  In your present state of health, do you have any difficulty performing the following activities: 07/23/2017 07/19/2017  Hearing? N N  Vision? N N  Difficulty concentrating or making decisions? Y N  Walking or climbing stairs? Y Y  Dressing or bathing? N N  Doing errands, shopping? N N  Preparing Food and eating ? N N  Using the Toilet? N N  In the past six months, have you accidently leaked  urine? N N  Do you have problems with loss of bowel control? N N  Managing your Medications? Y Y  Managing your Finances? [name]  Housekeeping or managing your Housekeeping? [name]  Some recent data might be hidden    Fall/Depression Screening:  PHQ 2/9 Scores 11/03/2017 07/23/2017 07/19/2017 07/14/2017 04/26/2017 02/23/2017 02/09/2017  PHQ - 2 Score 0 0 2 0 0 0 0  PHQ- 9 Score - - 7 - - - -    Assessment:   CSW traveled to home of client on 11/10/17.  CSW met with client on 11/10/17 at home of client for a routine home visit. Client said he was doing better. Client said that home health aide assists him in the home as scheduled on Mondays through Thursdays of each week.  Home Health aide is employed with [employer].  Client said he had adequate food supply at present. Client said he is sleeping adequately.  Client has prescribed medications and is taking medications as prescribed. Client is excited that he recently obtained a washer and dryer so he can do his own laundry as needed.  Client has reduced family support. Client has a friend named Ronalee Belts who helps  Client occasionally with shopping needs of client.  Client said he has adequate clothes at present.  CSW informed client that [place] had discussed client needs with care guide, Kathrin Greathouse, at [facility] on [address] in [place], [place]. [place] had informed Corrie Mckusick that client sometimes  needs help in transport to and from grocery stores.  Client has occasional chest pain and client had talked with his primary care doctor about client chest pain.  Client has adequate phone services. Client has sister who lives in the area.  Again, client has reduced family support. CSW spoke with client about client care plan . CSW encouraged client to talk with CSW in next 30 days about community resources for transport help for client.  During [place] visit with client, CSW called Geographical information systems officer, Bachelor of Social Work, and Publishing copy to research client's date and time  for next cardiology appointment with Dr. Jenkins Rouge. Amber is to update CSW when she obtains information about client's next cardiology appointment.  Client was appreciative of CSW visit with client on 11/10/17.  CSW thanked Ashish for allowing CSW to visit him at his home on 11/10/17.     Plan:   Client to talk with CSW in next 30 days to discuss community resources for transport help for client.  CSW to call client in 4 weeks to assess client needs.  [name].Keiry Kowal MSW, [name] Licensed Clinical Social Worker [facility] Care Management [phone]

## 2017-11-11 ENCOUNTER — Ambulatory Visit: Payer: Self-pay | Admitting: Cardiology

## 2017-11-23 ENCOUNTER — Telehealth: Payer: Self-pay | Admitting: Family Medicine

## 2017-11-23 NOTE — Telephone Encounter (Signed)
Spoke with pt regarding dental appointment Pt contacted Dr Cathey Endow office but they do not accept Mcd Will contact [facility] tomorrow for guidance

## 2017-11-23 NOTE — Telephone Encounter (Signed)
Patient aware to call Dr. Cathey Endow office.

## 2017-11-24 ENCOUNTER — Other Ambulatory Visit: Payer: Self-pay

## 2017-11-24 NOTE — Patient Outreach (Signed)
[place] [facility]) Care Management  11/24/2017  [name] Keep May 08, 1955 [id number]   BSW received request from CSW, [name], to schedule MD appointment and transportation for [name] due to significant mouth pain.  Scott contacted multiple dental providers in the [place] area but could not locate one that is in-network.  BSW scheduled appointment with Evelina Dun at [facility] for 11/25/17 @ 8:25 and arranged transportation via [employer].  BSW and CSW, [name], will continue trying to locate an in-network dental provider for [name].  [name], BSW Social Worker [phone]

## 2017-11-25 ENCOUNTER — Ambulatory Visit (INDEPENDENT_AMBULATORY_CARE_PROVIDER_SITE_OTHER): Payer: Medicare Other | Admitting: Family

## 2017-11-25 ENCOUNTER — Encounter: Payer: Self-pay | Admitting: Family

## 2017-11-25 VITALS — BP 127/75 | HR 86 | Temp 97.3°F | Ht 72.0 in | Wt 225.0 lb

## 2017-11-25 DIAGNOSIS — K047 Periapical abscess without sinus: Secondary | ICD-10-CM | POA: Diagnosis not present

## 2017-11-25 DIAGNOSIS — F17200 Nicotine dependence, unspecified, uncomplicated: Secondary | ICD-10-CM

## 2017-11-25 MED ORDER — AMOXICILLIN-POT CLAVULANATE 875-125 MG PO TABS: 1.0000 | ORAL_TABLET | Freq: Two times a day (BID) | ORAL | 0 refills | Status: DC

## 2017-11-25 NOTE — Patient Instructions (Signed)
Dental Abscess A dental abscess is pus in or around a tooth. Follow these instructions at home:  Take medicines only as told by your dentist.  If you were prescribed antibiotic medicine, finish all of it even if you start to feel better.  Rinse your mouth (gargle) often with salt water.  Do not drive or use heavy machinery, like a lawn mower, while taking pain medicine.  Do not apply heat to the outside of your mouth.  Keep all follow-up visits as told by your dentist. This is important. Contact a doctor if:  Your pain is worse, and medicine does not help. Get help right away if:  You have a fever or chills.  Your symptoms suddenly get worse.  You have a very bad headache.  You have problems breathing or swallowing.  You have trouble opening your mouth.  You have puffiness (swelling) in your neck or around your eye. This information is not intended to replace advice given to you by your health care provider. Make sure you discuss any questions you have with your health care provider. Document Released: 08/21/2014 Document Revised: 09/12/2015 Document Reviewed: 04/03/2014 Elsevier Interactive Patient Education  2018 Elsevier Inc.  

## 2017-11-25 NOTE — Progress Notes (Signed)
   Subjective:    Patient ID: [name], male    DOB: 03-22-1956, 62 y.o.   MRN: [id number]  Chief Complaint  Patient presents with  . Dental Pain    HPI PT presents to the office today with right upper tooth pain that started last week. He states yesterday it was "hurting so bad, I jerked my tooth out myself". He says he has not seen a dentists in a "long time". States the pain is constant of 10 out 10 that radiates up into her right upper jaw and down his neck.    Review of Systems  HENT: Positive for dental problem and facial swelling.   All other systems reviewed and are negative.      Objective:   Physical Exam  Constitutional: He is oriented to person, place, and time. He appears well-developed and well-nourished. No distress.  HENT:  Head: Normocephalic.  Mouth/Throat: Dental abscesses and dental caries present.    Eyes: Pupils are equal, round, and reactive to light. Right eye exhibits no discharge. Left eye exhibits no discharge.  Neck: Normal range of motion. Neck supple. No thyromegaly present.  Cardiovascular: Normal rate, regular rhythm, normal heart sounds and intact distal pulses.  No murmur heard. Pulmonary/Chest: Effort normal. No respiratory distress. He has wheezes.  Abdominal: Soft. Bowel sounds are normal. He exhibits no distension. There is no tenderness.  Musculoskeletal: Normal range of motion. He exhibits no edema or tenderness.  Neurological: He is alert and oriented to person, place, and time. He has normal reflexes. No cranial nerve deficit.  Skin: Skin is warm and dry. No rash noted. No erythema.  Psychiatric: He has a normal mood and affect. His behavior is normal. Judgment and thought content normal.  Vitals reviewed.   BP 127/75   Pulse 86   Temp (!) 97.3 F (36.3 C) (Oral)   Ht 6' (1.829 m)   Wt 225 lb (102.1 kg)   BMI 30.52 kg/m        Assessment & Plan:  Salar was seen today for dental pain.  Diagnoses and all orders for  this visit:  Dental abscess -     amoxicillin-clavulanate (AUGMENTIN) 875-125 MG tablet; Take 1 tablet by mouth 2 (two) times daily. -     Ambulatory referral to Dentistry  Current smoker   Start Augment today Follow up with dentistry  Good oral health discussed   Evelina Dun, FNP

## 2017-11-26 ENCOUNTER — Other Ambulatory Visit: Payer: Self-pay | Admitting: Licensed Clinical Social Worker

## 2017-11-26 NOTE — Patient Outreach (Signed)
Assessment:  CSW spoke via phone with client. CSW verified client identity. CSW received verbal permission from client for CSW to speak with client about client needs. Client recently had appointment with [name], Family Nurse Practitioner, related to medical issues for client. [name] did prescribed an antibiotic for client. [name] is also planning to make a dental referral for client related to current dental needs for [name].  Geographical information systems officer, Bachelor of Social Work, has made transport arrangements several times for client to travel to and from appointments at [facility]. Client has home health aide from [place] client with activities of daily living as scheduled each week. Client has adequate food supply. Client has little family support. Client has literacy issues and has difficulty remembering the details of his medical appointments. CSW talked with client about client care plan. CSW encouraged client to talk with CSW in next 30 days to discuss community resources for transport assistance for client.  Client is scheduled to receive antibiotic ordered for him by [name], Family Nurse Practitioner. Client receives prescribed medications from [facility].  CSW thanked client for phone call with CSW on 11/26/17. Client was appreciative of call from [place] on 11/26/17.   Plan:  Client to talk with CSW in next 30 days to discuss community resources for transport assistance for client.  CSW to call client in 4 weeks to assess client needs.  [name].Tonnya Garbett MSW, [name] Licensed Clinical Social Worker [facility] Care Management [phone]

## 2017-11-30 ENCOUNTER — Other Ambulatory Visit: Payer: Self-pay

## 2017-11-30 NOTE — Patient Outreach (Signed)
[place] [facility]) Care Management  11/30/2017  [name] 1956-04-11 [id number]   Request received from CSW, [name], to schedule transportation to dental appointment with Dr. Luvenia Heller on 12/06/17 @ 1:45 PM.  Transportation arranged via [employer].  [name], BSW Social Worker [phone]

## 2017-12-13 ENCOUNTER — Other Ambulatory Visit: Payer: Self-pay | Admitting: Licensed Clinical Social Worker

## 2017-12-13 NOTE — Patient Outreach (Signed)
[place] [facility]) Care Management  [facility] Social Work  12/13/2017  [name] [birth year] [id number]  Subjective:    Objective:   Encounter Medications:  Outpatient Encounter Medications as of 12/13/2017  Medication Sig  . acetaminophen (TYLENOL) 325 MG tablet Take 2 tablets (650 mg total) by mouth every 6 (six) hours as needed for mild pain or headache.  [name] amoxicillin-clavulanate (AUGMENTIN) 875-125 MG tablet Take 1 tablet by mouth 2 (two) times daily.  [name] aspirin EC 81 MG tablet Take 1 tablet (81 mg total) by mouth daily.  [name] atorvastatin (LIPITOR) 40 MG tablet   . DULoxetine (CYMBALTA) 30 MG capsule Take 2 capsules (60 mg total) by mouth daily. WITH A FULL STOMACH AT SUPPER TIME  . fluticasone (FLONASE) 50 MCG/ACT nasal spray USE 2 SPRAYS IN EACH NOSTRIL ONCE DAILY.  . fluticasone furoate-vilanterol (BREO ELLIPTA) 100-25 MCG/INH AEPB Inhale 1 puff into the lungs daily.  . isosorbide mononitrate (IMDUR) 30 MG 24 hr tablet   . lisinopril (PRINIVIL,ZESTRIL) 20 MG tablet TAKE 1 TABLET DAILY  . metoprolol tartrate (LOPRESSOR) 25 MG tablet Take 1 tablet (25 mg total) by mouth 2 (two) times daily.  . nitroGLYCERIN (NITROSTAT) 0.4 MG SL tablet Place 1 tablet (0.4 mg total) under the tongue every 5 (five) minutes x 3 doses as needed for chest pain.  . pantoprazole (PROTONIX) 40 MG tablet Take 1 tablet (40 mg total) by mouth 2 (two) times daily.  [name] PROAIR HFA 108 (90 Base) MCG/ACT inhaler 1-2 puffs every 6 hours as needed wheezing or shortness of breath.  . tamsulosin (FLOMAX) 0.4 MG CAPS capsule Take 2 capsules (0.8 mg total) by mouth daily.   No facility-administered encounter medications on file as of 12/13/2017.     Functional Status:  In your present state of health, do you have any difficulty performing the following activities: 07/23/2017 07/19/2017  Hearing? N N  Vision? N N  Difficulty concentrating or making decisions? Y N  Walking or climbing stairs? Y Y  Dressing or  bathing? N N  Doing errands, shopping? N N  Preparing Food and eating ? N N  Using the Toilet? N N  In the past six months, have you accidently leaked urine? N N  Do you have problems with loss of bowel control? N N  Managing your Medications? Y Y  Managing your Finances? [name]  Housekeeping or managing your Housekeeping? [name]  Some recent data might be hidden    Fall/Depression Screening:  PHQ 2/9 Scores 11/25/2017 11/03/2017 07/23/2017 07/19/2017 07/14/2017 04/26/2017 02/23/2017  PHQ - 2 Score 0 0 0 2 0 0 0  PHQ- 9 Score - - - 7 - - -    Assessment:   CSW traveled to home of client on 12/13/17 CSW met with client on 12/13/17 at home of client for a routine home visit. CSW provided client with 6 cans of soup, apple sauce and jello for use. Client had recent dental surgery for removal of 6 teeth. He is eating soft foods, soups and items that are more liquid in form.  Client said he was glad he had dental surgery for removal of teeth.  Client said home health aide was helping him in the home each week as scheduled. He said home health aide prepared soup for client for lunch today.  Client said he was managing well in the home.  He said he is taking pain medication as prescribed. He said he is sleeping well. Client is  working towards care plan goal.  He said he enjoys spending time with his pet dog.  CSW thanked client for allowing CSW to visit client at home of client on 12/13/17.  Client was appreciative of home visit of CSW with client on 12/13/17.   Plan:  CSW to call client as scheduled to assess client needs.  [name].Anwar Sakata MSW, [name] Licensed Clinical Social Worker [facility] Care Management [phone]

## 2017-12-14 ENCOUNTER — Ambulatory Visit: Payer: Self-pay | Admitting: Licensed Clinical Social Worker

## 2017-12-15 ENCOUNTER — Other Ambulatory Visit: Payer: Self-pay

## 2017-12-15 NOTE — Patient Outreach (Signed)
[facility] [facility]) Care Management  12/15/2017  Myson Levi Nading 31-Jan-1956 [id number]   BSW received call from CSW, [name], reporting that Mr. [name] was requesting transportation to an appointment at [facility] on 12/16/17.  BSW contacted them as this appointment was not showing in [place].  He does not have another appointment until 02/04/18.  BSW contacted [facility] to determine if he has a follow up appointment with them.  He was supposed to have an appointment today.  Appointment was rescheduled for 12/23/17 @ 10:00 AM and transportation was arranged via [employer].  BSW informed Mr. Anguiano of this follow up appointment and that transportation has been arranged.   [name], BSW Social Worker [phone]

## 2017-12-16 ENCOUNTER — Telehealth: Payer: Self-pay | Admitting: Family Medicine

## 2017-12-17 MED ORDER — NAPROXEN 500 MG PO TABS: 500.0000 mg | ORAL_TABLET | Freq: Two times a day (BID) | ORAL | 0 refills | Status: DC

## 2017-12-17 NOTE — Telephone Encounter (Signed)
I sent in naproxen for him, have him alternate between Tylenol and naproxen and use both.

## 2017-12-17 NOTE — Telephone Encounter (Signed)
Patient saw Alyse Low the other day for dental pain and was placed on antibiotic.  He has an appointment with the dentist next week.  He is having bad headaches and has been taking Tylenol.  He does not have any transportation, uses public transportation and must give three days notice, so he cannot come in to be seen.  Would like to know if there is anything that can be sent to [facility] to help.

## 2017-12-17 NOTE — Telephone Encounter (Signed)
lmtcb

## 2017-12-21 ENCOUNTER — Other Ambulatory Visit: Payer: Self-pay

## 2017-12-21 NOTE — Patient Outreach (Signed)
[place] [facility]) Care Management  12/21/2017  Milam Allbaugh Zammit 07/30/1955 [id number]   Request received from CSW, [name], to arrange transportation to appointment with Dr. Livia Snellen on 02/04/18 @ 3:00 PM.  Transportation arranged via [employer].   [name], BSW Social Worker [phone]

## 2017-12-22 ENCOUNTER — Other Ambulatory Visit: Payer: Self-pay

## 2017-12-22 NOTE — Patient Outreach (Signed)
[place] [facility]) Care Management  12/22/2017  Edker Punt Schnackenberg 27-Mar-1956 [id number]   BSW called Mr. Pound to remind him of follow up appointment with Dr. Costella Hatcher on 12/23/17 and informed him that [place] will pick him up for transport at 9:00 AM.  [name], [place] Worker [phone]

## 2017-12-27 NOTE — Telephone Encounter (Signed)
Unable to reach patient, encounter over a week old, encounter closed.

## 2017-12-29 ENCOUNTER — Other Ambulatory Visit: Payer: Self-pay | Admitting: Licensed Clinical Social Worker

## 2017-12-29 NOTE — Patient Outreach (Signed)
Assessment:  CSW spoke via phone with client. CSW verified client identity. CSW received verbal permission from client for CSW to speak with client about client needs. Client asked for number for Social Security Administration. CSW gave client the number for the Social Security Administration.  Client said he planned to call Social Security Administration to discuss his monthly Social Security benefit.  Client said he is still eating soft foods as a result of recent dental surgery.  He said he had adequate food supply.  He said he had his prescribed medications and is taking medications as prescribed. CSW spoke with client about client care plan.  CSW encouraged [place] to communicate with CSW in next 30 days to discuss community resources for transportation assistance for client. CSW talked with client about his use of [place] for transport help. CSW described to client that he could call that transport agency to arrange transport for client on a Friday to local food store to obtain food items. Cost for client would be  $ 3.00 each way. CSW shared this information with client. Client has little family support. Client has been attending medical appointments as scheduled. CSW thanked client for phone call with CSW. CSW encouraged client to call CSW as needed to discuss social work needs of client.   Plan:  Client to communicate with CSW in the next 30 days to discuss community resources for transport assistance for client.   CSW to call client in 4 weeks to assess client needs.  [name].Deadrick Stidd MSW, [name] Licensed Clinical Social Worker [facility] Care Management [phone]

## 2017-12-30 ENCOUNTER — Other Ambulatory Visit: Payer: Self-pay

## 2017-12-30 NOTE — Patient Outreach (Signed)
[facility]) Care Management  12/30/2017  [name] [birth year] [id number]   Request received from CSW, [name], to arrange transportation to appointment with Dr. Livia Snellen on 01/05/18 @ 3:55 PM.  Transportation arranged via [employer].   [name], BSW Social Worker [phone]

## 2018-01-05 ENCOUNTER — Ambulatory Visit: Payer: Medicare Other | Admitting: Family Medicine

## 2018-01-05 ENCOUNTER — Encounter: Payer: Self-pay | Admitting: Family Medicine

## 2018-01-05 ENCOUNTER — Ambulatory Visit (INDEPENDENT_AMBULATORY_CARE_PROVIDER_SITE_OTHER): Payer: Medicare Other | Admitting: Family Medicine

## 2018-01-05 VITALS — BP 133/73 | HR 87 | Temp 98.0°F | Ht 72.0 in | Wt 225.8 lb

## 2018-01-05 DIAGNOSIS — J411 Mucopurulent chronic bronchitis: Secondary | ICD-10-CM

## 2018-01-05 DIAGNOSIS — J441 Chronic obstructive pulmonary disease with (acute) exacerbation: Secondary | ICD-10-CM

## 2018-01-05 DIAGNOSIS — F17200 Nicotine dependence, unspecified, uncomplicated: Secondary | ICD-10-CM

## 2018-01-05 MED ORDER — VARENICLINE TARTRATE 0.5 MG X 11 & 1 MG X 42 PO MISC: ORAL | 0 refills | Status: DC

## 2018-01-05 MED ORDER — ALBUTEROL SULFATE HFA 108 (90 BASE) MCG/ACT IN AERS: INHALATION_SPRAY | RESPIRATORY_TRACT | 10 refills | Status: DC

## 2018-01-05 NOTE — Progress Notes (Signed)
Subjective:  Patient ID: [name], male    DOB: 1955/05/15  Age: 62 y.o. MRN: [id number]  CC: discuss smoking cessation   HPI [name] presents for help with smoking cessation.  Based on previous discussions in the past he has opted for Chantix.  He says that he is tried the patch before and it did not help.  He smokes approximately half pack daily.  He has been doing this for many years.  He notes now that he started having a chronic cough with some white sputum.  Depression screen [facility] 2/9 01/05/2018 11/25/2017 11/03/2017  Decreased Interest - 0 0  Down, Depressed, Hopeless 0 0 0  PHQ - 2 Score 0 0 0  Altered sleeping - - -  Tired, decreased energy - - -  Change in appetite - - -  Feeling bad or failure about yourself  - - -  Trouble concentrating - - -  Moving slowly or fidgety/restless - - -  Suicidal thoughts - - -  PHQ-9 Score - - -  Difficult doing work/chores - - -  Some recent data might be hidden    History [name] has a past medical history of Cerebral palsy ([place]), Coronary artery disease, Depression, GERD (gastroesophageal reflux disease), Hypertension, Myocardial infarction ([place]) (2008), Scoliosis, and Seizures ([place]).   He has a past surgical history that includes Coronary angioplasty with stent; Carpal tunnel release (Right, 08/13/2016); and LEFT HEART CATH AND CORONARY ANGIOGRAPHY (N/A, 07/10/2017).   His family history includes Alcohol abuse in his brother; CAD in his brother; CAD (age of onset: 11) in his father; Diabetes in his mother; Heart disease in his father.He reports that he has been smoking cigarettes. He has been smoking about 1.00 pack per day. He has never used smokeless tobacco. He reports that he drinks alcohol. He reports that he does not use drugs.    ROS Review of Systems  Constitutional: Negative for fever.  Respiratory: Negative for shortness of breath.   Cardiovascular: Negative for chest pain.  Musculoskeletal: Negative for  arthralgias.  Skin: Negative for rash.    Objective:  BP 133/73   Pulse 87   Temp 98 F (36.7 C) (Oral)   Ht 6' (1.829 m)   Wt 225 lb 12.8 oz (102.4 kg)   BMI 30.62 kg/m   BP Readings from Last 3 Encounters:  01/05/18 133/73  11/25/17 127/75  11/03/17 130/78    Wt Readings from Last 3 Encounters:  01/05/18 225 lb 12.8 oz (102.4 kg)  11/25/17 225 lb (102.1 kg)  11/03/17 223 lb 8 oz (101.4 kg)     Physical Exam  Constitutional: He is oriented to person, place, and time. He appears well-developed and well-nourished.  HENT:  Head: Normocephalic and atraumatic.  Right Ear: External ear normal.  Left Ear: External ear normal.  Mouth/Throat: No oropharyngeal exudate or posterior oropharyngeal erythema.  Eyes: Pupils are equal, round, and reactive to light.  Neck: Normal range of motion. Neck supple.  Cardiovascular: Normal rate and regular rhythm.  No murmur heard. Pulmonary/Chest: Breath sounds normal. No respiratory distress.  Neurological: He is alert and oriented to person, place, and time.  Vitals reviewed.     Assessment & Plan:   Anthonie was seen today for discuss smoking cessation.  Diagnoses and all orders for this visit:  Smoker  COPD exacerbation ([place]) -     albuterol (PROAIR HFA) 108 (90 Base) MCG/ACT inhaler; 1-2 puffs every 6 hours as needed wheezing or  shortness of breath.  Mucopurulent chronic bronchitis ([place])  Other orders -     varenicline (CHANTIX STARTING MONTH PAK) 0.5 MG X 11 & 1 MG X 42 tablet; Use according to package directions       I have discontinued Christian Mate. Mcjunkins's amoxicillin-clavulanate and naproxen. I have also changed his PROAIR HFA to albuterol. Additionally, I am having him start on varenicline. Lastly, I am having him maintain his aspirin EC, acetaminophen, DULoxetine, fluticasone furoate-vilanterol, nitroGLYCERIN, pantoprazole, tamsulosin, fluticasone, lisinopril, atorvastatin, metoprolol tartrate, and isosorbide  mononitrate.  Allergies as of 01/05/2018   No Known Allergies     Medication List        Accurate as of 01/05/18  5:19 PM. Always use your most recent med list.          acetaminophen 325 MG tablet Commonly known as:  TYLENOL Take 2 tablets (650 mg total) by mouth every 6 (six) hours as needed for mild pain or headache.   albuterol 108 (90 Base) MCG/ACT inhaler Commonly known as:  PROVENTIL HFA;VENTOLIN HFA 1-2 puffs every 6 hours as needed wheezing or shortness of breath.   aspirin EC 81 MG tablet Take 1 tablet (81 mg total) by mouth daily.   atorvastatin 40 MG tablet Commonly known as:  LIPITOR   DULoxetine 30 MG capsule Commonly known as:  CYMBALTA Take 2 capsules (60 mg total) by mouth daily. WITH A FULL STOMACH AT SUPPER TIME   fluticasone 50 MCG/ACT nasal spray Commonly known as:  FLONASE USE 2 SPRAYS IN EACH NOSTRIL ONCE DAILY.   fluticasone furoate-vilanterol 100-25 MCG/INH Aepb Commonly known as:  BREO ELLIPTA Inhale 1 puff into the lungs daily.   isosorbide mononitrate 30 MG 24 hr tablet Commonly known as:  IMDUR   lisinopril 20 MG tablet Commonly known as:  PRINIVIL,ZESTRIL TAKE 1 TABLET DAILY   metoprolol tartrate 25 MG tablet Commonly known as:  LOPRESSOR Take 1 tablet (25 mg total) by mouth 2 (two) times daily.   nitroGLYCERIN 0.4 MG SL tablet Commonly known as:  NITROSTAT Place 1 tablet (0.4 mg total) under the tongue every 5 (five) minutes x 3 doses as needed for chest pain.   pantoprazole 40 MG tablet Commonly known as:  PROTONIX Take 1 tablet (40 mg total) by mouth 2 (two) times daily.   tamsulosin 0.4 MG Caps capsule Commonly known as:  FLOMAX Take 2 capsules (0.8 mg total) by mouth daily.   varenicline 0.5 MG X 11 & 1 MG X 42 tablet Commonly known as:  CHANTIX PAK Use according to package directions        Follow-up: Return in about 1 month (around 02/04/2018).  [name], M.D.

## 2018-01-06 ENCOUNTER — Telehealth: Payer: Self-pay | Admitting: Family Medicine

## 2018-01-06 NOTE — Telephone Encounter (Signed)
PT wants to know if Dr Livia Snellen can fill out a paper to get more hours for his home health nurse to come out to his home

## 2018-01-06 NOTE — Telephone Encounter (Signed)
Cathy-- do you know anything about this?

## 2018-01-07 NOTE — Telephone Encounter (Signed)
Form filled out and placed on providers desk for signature.

## 2018-01-11 NOTE — Telephone Encounter (Signed)
Pt aware form has been faxed 

## 2018-01-21 ENCOUNTER — Telehealth: Payer: Self-pay | Admitting: Family Medicine

## 2018-01-24 NOTE — Telephone Encounter (Signed)
Talked to [facility] & pt on Friday evening. [facility] received paperwork, they have tried to contact pt to setup appt for re-assessment. Called pt gave him [facility]'s number so that he can setup this appt

## 2018-01-25 ENCOUNTER — Other Ambulatory Visit: Payer: Self-pay | Admitting: Family Medicine

## 2018-01-27 ENCOUNTER — Telehealth: Payer: Self-pay | Admitting: Family Medicine

## 2018-01-28 ENCOUNTER — Other Ambulatory Visit: Payer: Self-pay | Admitting: Licensed Clinical Social Worker

## 2018-01-28 ENCOUNTER — Telehealth: Payer: Self-pay | Admitting: Family Medicine

## 2018-01-28 NOTE — Telephone Encounter (Signed)
Spoke with [employer] social worker and he states that he spoke with patient today to check on him and he states that he is not taking his chantix because it made him feel bad and "sick". Social worker thinks that he only tried it a day or so and advised him he should give it more time to work and get in his system but patient refuses. FYI

## 2018-01-28 NOTE — Patient Outreach (Signed)
Assessment:  CSW spoke via phone with client . CSW verified client identity. CSW received verbal permission from client for CSW to speak with client about client needs. Client continues to receive in home aide support weekly as scheduled with home health aide through [facility]. Client said he had his prescribed medications and is taking medications as prescribed. Client did not mention any current pain issues. Client is attending scheduled client medical appointments.  CSW spoke with [name] about client care plan. CSW encouraged [place] to communicate with CSW in next 30 days to discuss community resources for transport assistance for client.  Client had appointment a few weeks ago with Dr. Livia Snellen . Dr. Livia Snellen talked with client about smoking cessation.  Client has said he has smoked for a number of years. Client had requested home health aide be allowed to help him more hours weekly in the home.  Dr. Livia Snellen completed form needed and form was faxed to [facility] to review.  [place] is in process of setting up home visit for assessment with client. CSW has talked previously with [name] about [place] and transport help through that agency. CSW thanked client for phone call with CSW on 01/28/18.  CSW encouraged client to call CSW as needed to discuss social work needs of client.     Plan:  Client to communicate with CSW in next 30 days to discuss community resources for transport assistance for client.   CSW to call client in 4 weeks to assess client needs.  [name].Orpha Dain MSW, [name] Licensed Clinical Social Worker [facility] Care Management [phone]

## 2018-01-31 ENCOUNTER — Other Ambulatory Visit: Payer: Self-pay

## 2018-01-31 NOTE — Patient Outreach (Signed)
[facility] [facility]) Care Management  01/31/2018  [name] [birth year] [id number]   Transportation arranged via [place] for appointment with Dr. [name] on 02/07/18 @ 4:25 PM.  [name], [place] Worker [phone]

## 2018-02-04 ENCOUNTER — Ambulatory Visit: Payer: Medicare Other | Admitting: *Deleted

## 2018-02-07 ENCOUNTER — Ambulatory Visit (INDEPENDENT_AMBULATORY_CARE_PROVIDER_SITE_OTHER): Payer: Medicare Other | Admitting: Family Medicine

## 2018-02-07 ENCOUNTER — Encounter: Payer: Self-pay | Admitting: Family Medicine

## 2018-02-07 VITALS — BP 118/79 | HR 77 | Temp 97.5°F | Ht 72.0 in | Wt 227.2 lb

## 2018-02-07 DIAGNOSIS — Z23 Encounter for immunization: Secondary | ICD-10-CM

## 2018-02-07 DIAGNOSIS — R079 Chest pain, unspecified: Secondary | ICD-10-CM | POA: Diagnosis not present

## 2018-02-07 DIAGNOSIS — G40909 Epilepsy, unspecified, not intractable, without status epilepticus: Secondary | ICD-10-CM | POA: Diagnosis not present

## 2018-02-07 DIAGNOSIS — I252 Old myocardial infarction: Secondary | ICD-10-CM | POA: Diagnosis not present

## 2018-02-07 DIAGNOSIS — F17200 Nicotine dependence, unspecified, uncomplicated: Secondary | ICD-10-CM

## 2018-02-07 MED ORDER — NITROGLYCERIN 0.4 MG SL SUBL: 0.4000 mg | SUBLINGUAL_TABLET | SUBLINGUAL | 11 refills | Status: DC | PRN

## 2018-02-07 MED ORDER — BUPROPION HCL ER (SR) 150 MG PO TB12: 150.0000 mg | ORAL_TABLET | Freq: Two times a day (BID) | ORAL | 2 refills | Status: DC

## 2018-02-07 NOTE — Progress Notes (Signed)
Chief Complaint  Patient presents with  . Nicotine Dependence    Smoking cessation recheck    HPI  Patient presents today for recheck of his smoking cessation treatment.  He tells me today he took 2 pills and the second dose caused him to start vomiting and become nauseous.  He vomited 3 times so he discontinued the medication and he is now smoking again area additionally he brings in documentation and for my review that shows that he has been cut from 61 to 35 hours/month of assistance.  Patient does not understand his thinking that instead that his disability benefit is being cut.  Review of the forearms shows that there is a phone number to call for the delayed.  I advised him to call that number to see if anything can be done to get some of the lost services back.  Patient mentions that he had some atypical chest pain as sharp sensation going from the right chest over to the left.  Occurred last week.  It was not related to exertion.  However he did get relief with nitroglycerin.  PMH: Smoking status noted ROS: Review of Systems  Constitutional: Negative for fever.  Respiratory: Negative for shortness of breath.   Cardiovascular: Positive for chest pain.  Musculoskeletal: Negative for arthralgias.  Skin: Negative for rash.    Objective: BP 118/79   Pulse 77   Temp (!) 97.5 F (36.4 C) (Oral)   Ht 6' (1.829 m)   Wt 227 lb 3.2 oz (103.1 kg)   BMI 30.81 kg/m  Gen: NAD, alert, cooperative with exam HEENT: NCAT, EOMI, PERRL CV: RRR, good S1/S2, no murmur Resp: CTABL, no wheezes, non-labored Abd: SNTND, BS present, no guarding or organomegaly Ext: No edema, warm Neuro: Alert and oriented, No gross deficits  Assessment and plan:  1. Smoker   2. History of MI (myocardial infarction)   3. Chest pain, unspecified type   4. Seizure disorder ([place]) Chronic  5. Need for immunization against influenza     Meds ordered this encounter  Medications  . nitroGLYCERIN (NITROSTAT) 0.4 MG  SL tablet    Sig: Place 1 tablet (0.4 mg total) under the tongue every 5 (five) minutes x 3 doses as needed for chest pain.    Dispense:  25 tablet    Refill:  11  . buPROPion (WELLBUTRIN SR) 150 MG 12 hr tablet    Sig: Take 1 tablet (150 mg total) by mouth 2 (two) times daily.    Dispense:  60 tablet    Refill:  2    Orders Placed This Encounter  Procedures  . Pneumococcal conjugate vaccine 13-valent  . Flu Vaccine QUAD 36+ mos IM  Patient should let me know if his chest pain continues.  For now try the nitroglycerin if it can be relieved with one nitroglycerin than he should be okay.  On the other hand if it gets to be more severe and occurs more often will need to have cardiology intervene.  Follow up as needed.  [name], MD

## 2018-02-14 ENCOUNTER — Telehealth: Payer: Self-pay | Admitting: Family Medicine

## 2018-02-14 ENCOUNTER — Other Ambulatory Visit: Payer: Self-pay

## 2018-02-14 ENCOUNTER — Telehealth: Payer: Self-pay | Admitting: *Deleted

## 2018-02-14 NOTE — Telephone Encounter (Signed)
Incoming call from [facility] clinical social worker with [place] worker was contacted by pt who stated he has been coughing up blood since doing yardwork on Saturday Pt states he was also having some chest pain which was relieved by Ntg Call to pt who states chest pain is better but pt is still coughing up some blood Offered pt appt for evaluation Pt will call back to schedule after arranging transportation Pt instructed to call 911 if symptoms worsen Pt verbalizes understanding

## 2018-02-14 NOTE — Patient Outreach (Signed)
[place] [facility]) Care Management  02/14/2018  Goku Harb Fawbush 1955-08-02 [id number]   Request received from [place], [name], to arrange transportation to appointment at [place] on 02/15/18 @ 9:00.  Transportation arranged via [place] .  [name], BSW Social Worker [phone]

## 2018-02-14 NOTE — Telephone Encounter (Signed)
Zigmund Daniel handled this call

## 2018-02-15 ENCOUNTER — Encounter: Payer: Self-pay | Admitting: Family Medicine

## 2018-02-15 ENCOUNTER — Ambulatory Visit (INDEPENDENT_AMBULATORY_CARE_PROVIDER_SITE_OTHER): Payer: Medicare Other | Admitting: Family Medicine

## 2018-02-15 ENCOUNTER — Ambulatory Visit (INDEPENDENT_AMBULATORY_CARE_PROVIDER_SITE_OTHER): Payer: Medicare Other

## 2018-02-15 VITALS — BP 124/72 | HR 80 | Temp 98.4°F | Ht 72.0 in | Wt 219.0 lb

## 2018-02-15 DIAGNOSIS — R042 Hemoptysis: Secondary | ICD-10-CM

## 2018-02-15 DIAGNOSIS — J411 Mucopurulent chronic bronchitis: Secondary | ICD-10-CM

## 2018-02-15 DIAGNOSIS — R05 Cough: Secondary | ICD-10-CM | POA: Diagnosis not present

## 2018-02-15 LAB — CBC WITH DIFFERENTIAL/PLATELET
BASOS: 1 %
Basophils Absolute: 0.1 10*3/uL (ref 0.0–0.2)
EOS (ABSOLUTE): 2 10*3/uL — ABNORMAL HIGH (ref 0.0–0.4)
Eos: 17 %
HEMATOCRIT: 45.3 % (ref 37.5–51.0)
Hemoglobin: 16.3 g/dL (ref 13.0–17.7)
Immature Grans (Abs): 0 10*3/uL (ref 0.0–0.1)
Immature Granulocytes: 0 %
LYMPHS ABS: 3.3 10*3/uL — AB (ref 0.7–3.1)
Lymphs: 29 %
MCH: 34.2 pg — AB (ref 26.6–33.0)
MCHC: 36 g/dL — AB (ref 31.5–35.7)
MCV: 95 fL (ref 79–97)
MONOS ABS: 0.6 10*3/uL (ref 0.1–0.9)
Monocytes: 5 %
NEUTROS ABS: 5.7 10*3/uL (ref 1.4–7.0)
Neutrophils: 48 %
Platelets: 272 10*3/uL (ref 150–450)
RBC: 4.76 x10E6/uL (ref 4.14–5.80)
RDW: 12.5 % (ref 12.3–15.4)
WBC: 11.7 10*3/uL — AB (ref 3.4–10.8)

## 2018-02-15 MED ORDER — SUCRALFATE 1 G PO TABS: 1.0000 g | ORAL_TABLET | Freq: Three times a day (TID) | ORAL | 1 refills | Status: DC

## 2018-02-15 NOTE — Progress Notes (Signed)
Subjective:  Patient ID: [name], male    DOB: [birth year]  Age: 62 y.o. MRN: [id number]  CC: spitting up blood   HPI [name] presents for spitting up several times a day. About six each day for three days. Small amounts. No cough. No GI distress. Appetite normal. Denies, melena, hematochezia. Pt. Is mentally challenged, but denies hematemesis, but has apparently vomited.   Depression screen [facility] 2/9 02/15/2018 02/07/2018 01/05/2018  Decreased Interest 0 0 -  Down, Depressed, Hopeless 0 0 0  PHQ - 2 Score 0 0 0  Altered sleeping - - -  Tired, decreased energy - - -  Change in appetite - - -  Feeling bad or failure about yourself  - - -  Trouble concentrating - - -  Moving slowly or fidgety/restless - - -  Suicidal thoughts - - -  PHQ-9 Score - - -  Difficult doing work/chores - - -  Some recent data might be hidden    History [name] has a past medical history of Cerebral palsy ([place]), Coronary artery disease, Depression, GERD (gastroesophageal reflux disease), Hypertension, Myocardial infarction ([place]) (2008), Scoliosis, and Seizures ([place]).   He has a past surgical history that includes Coronary angioplasty with stent; Carpal tunnel release (Right, 08/13/2016); and LEFT HEART CATH AND CORONARY ANGIOGRAPHY (N/A, 07/10/2017).   His family history includes Alcohol abuse in his brother; CAD in his brother; CAD (age of onset: 50) in his father; Diabetes in his mother; Heart disease in his father.He reports that he has been smoking cigarettes. He has been smoking about 1.00 pack per day. He has never used smokeless tobacco. He reports that he drinks alcohol. He reports that he does not use drugs.    ROS Review of Systems  Constitutional: Negative.   HENT: Negative.  Negative for nosebleeds and trouble swallowing.   Eyes: Negative for visual disturbance.  Respiratory: Negative for cough and shortness of breath.   Cardiovascular: Negative for chest pain and leg swelling.    Gastrointestinal: Positive for nausea and vomiting. Negative for abdominal pain, blood in stool and diarrhea.  Genitourinary: Negative for difficulty urinating and hematuria.  Musculoskeletal: Negative for arthralgias and myalgias.  Skin: Negative for rash.  Neurological: Negative for headaches.  Psychiatric/Behavioral: Negative for sleep disturbance.    Objective:  BP 124/72   Pulse 80   Temp 98.4 F (36.9 C) (Oral)   Ht 6' (1.829 m)   Wt 219 lb (99.3 kg)   BMI 29.70 kg/m   BP Readings from Last 3 Encounters:  02/15/18 124/72  02/07/18 118/79  01/05/18 133/73    Wt Readings from Last 3 Encounters:  02/15/18 219 lb (99.3 kg)  02/07/18 227 lb 3.2 oz (103.1 kg)  01/05/18 225 lb 12.8 oz (102.4 kg)     Physical Exam  Constitutional: He appears well-developed and well-nourished.  HENT:  Head: Normocephalic and atraumatic.  Right Ear: Tympanic membrane and external ear normal. No decreased hearing is noted.  Left Ear: Tympanic membrane and external ear normal. No decreased hearing is noted.  Mouth/Throat: No oropharyngeal exudate or posterior oropharyngeal erythema.  Eyes: Pupils are equal, round, and reactive to light.  Neck: Normal range of motion. Neck supple.  Cardiovascular: Normal rate and regular rhythm.  No murmur heard. Pulmonary/Chest: Breath sounds normal. No respiratory distress.  Abdominal: Soft. Bowel sounds are normal. He exhibits no mass. There is no tenderness.  Vitals reviewed.     Assessment & Plan:   Parv was  seen today for spitting up blood.  Diagnoses and all orders for this visit:  Mucopurulent chronic bronchitis ([place]) -     CBC with Differential/Platelet -     DG Chest 2 View; Future  Bloody sputum -     CBC with Differential/Platelet -     DG Chest 2 View; Future  Other orders -     sucralfate (CARAFATE) 1 g tablet; Take 1 tablet (1 g total) by mouth 4 (four) times daily -  with meals and at bedtime.       I have  discontinued Kreston Ahrendt. [name]'s aspirin EC. I am also having him start on sucralfate. Additionally, I am having him maintain his acetaminophen, DULoxetine, fluticasone furoate-vilanterol, pantoprazole, fluticasone, lisinopril, atorvastatin, metoprolol tartrate, isosorbide mononitrate, albuterol, tamsulosin, nitroGLYCERIN, and buPROPion.  Allergies as of 02/15/2018      Reactions   Chantix [varenicline Tartrate] Nausea And Vomiting      Medication List        Accurate as of 02/15/18 12:35 PM. Always use your most recent med list.          acetaminophen 325 MG tablet Commonly known as:  TYLENOL Take 2 tablets (650 mg total) by mouth every 6 (six) hours as needed for mild pain or headache.   albuterol 108 (90 Base) MCG/ACT inhaler Commonly known as:  PROVENTIL HFA;VENTOLIN HFA 1-2 puffs every 6 hours as needed wheezing or shortness of breath.   atorvastatin 40 MG tablet Commonly known as:  LIPITOR   buPROPion 150 MG 12 hr tablet Commonly known as:  WELLBUTRIN SR Take 1 tablet (150 mg total) by mouth 2 (two) times daily.   DULoxetine 30 MG capsule Commonly known as:  CYMBALTA Take 2 capsules (60 mg total) by mouth daily. WITH A FULL STOMACH AT SUPPER TIME   fluticasone 50 MCG/ACT nasal spray Commonly known as:  FLONASE USE 2 SPRAYS IN EACH NOSTRIL ONCE DAILY.   fluticasone furoate-vilanterol 100-25 MCG/INH Aepb Commonly known as:  BREO ELLIPTA Inhale 1 puff into the lungs daily.   isosorbide mononitrate 30 MG 24 hr tablet Commonly known as:  IMDUR   lisinopril 20 MG tablet Commonly known as:  PRINIVIL,ZESTRIL TAKE 1 TABLET DAILY   metoprolol tartrate 25 MG tablet Commonly known as:  LOPRESSOR Take 1 tablet (25 mg total) by mouth 2 (two) times daily.   nitroGLYCERIN 0.4 MG SL tablet Commonly known as:  NITROSTAT Place 1 tablet (0.4 mg total) under the tongue every 5 (five) minutes x 3 doses as needed for chest pain.   pantoprazole 40 MG tablet Commonly known as:   PROTONIX Take 1 tablet (40 mg total) by mouth 2 (two) times daily.   sucralfate 1 g tablet Commonly known as:  CARAFATE Take 1 tablet (1 g total) by mouth 4 (four) times daily -  with meals and at bedtime.   tamsulosin 0.4 MG Caps capsule Commonly known as:  FLOMAX TAKE (1) CAPSULE DAILY      Based on the patient's history and exam is difficult to tell where the source of blood is.  It is more likely GI than respiratory but chest x-ray should help with that.  Additionally he is not always reliable with regard to his medicines it is unclear whether he is actually taking the Protonix twice a day as directed.  I reminded him to be sure to take that medicine twice daily and added Carafate before each meal in the meantime.  Follow-up: Return if symptoms worsen or  fail to improve.  [name], M.D.

## 2018-02-16 ENCOUNTER — Other Ambulatory Visit: Payer: Self-pay

## 2018-02-16 NOTE — Patient Outreach (Signed)
[place] [name]) Care Management  02/16/2018  Dewel Lotter Constantin 03-30-1956 [id number]   Transportation arranged via [place] for appointment with Dr. Livia Snellen on 02/22/18 @ 8:10 AM.  [name], [place] Worker [phone]

## 2018-02-17 ENCOUNTER — Other Ambulatory Visit: Payer: Self-pay | Admitting: Family Medicine

## 2018-02-22 ENCOUNTER — Ambulatory Visit (INDEPENDENT_AMBULATORY_CARE_PROVIDER_SITE_OTHER): Payer: Medicare Other | Admitting: Family Medicine

## 2018-02-22 ENCOUNTER — Encounter: Payer: Self-pay | Admitting: Family Medicine

## 2018-02-22 VITALS — BP 123/78 | HR 84 | Temp 96.3°F | Ht 72.0 in | Wt 226.2 lb

## 2018-02-22 DIAGNOSIS — I10 Essential (primary) hypertension: Secondary | ICD-10-CM | POA: Diagnosis not present

## 2018-02-22 DIAGNOSIS — J441 Chronic obstructive pulmonary disease with (acute) exacerbation: Secondary | ICD-10-CM

## 2018-02-22 DIAGNOSIS — J440 Chronic obstructive pulmonary disease with acute lower respiratory infection: Secondary | ICD-10-CM | POA: Diagnosis not present

## 2018-02-22 DIAGNOSIS — I25118 Atherosclerotic heart disease of native coronary artery with other forms of angina pectoris: Secondary | ICD-10-CM

## 2018-02-22 DIAGNOSIS — J209 Acute bronchitis, unspecified: Secondary | ICD-10-CM

## 2018-02-22 DIAGNOSIS — E785 Hyperlipidemia, unspecified: Secondary | ICD-10-CM

## 2018-02-22 MED ORDER — TAMSULOSIN HCL 0.4 MG PO CAPS: ORAL_CAPSULE | ORAL | 1 refills | Status: DC

## 2018-02-22 MED ORDER — BUPROPION HCL ER (SR) 150 MG PO TB12: 150.0000 mg | ORAL_TABLET | Freq: Two times a day (BID) | ORAL | 2 refills | Status: DC

## 2018-02-22 MED ORDER — ALBUTEROL SULFATE HFA 108 (90 BASE) MCG/ACT IN AERS: INHALATION_SPRAY | RESPIRATORY_TRACT | 10 refills | Status: DC

## 2018-02-22 MED ORDER — DULOXETINE HCL 30 MG PO CPEP: 60.0000 mg | ORAL_CAPSULE | Freq: Every day | ORAL | 1 refills | Status: DC

## 2018-02-22 MED ORDER — LISINOPRIL 20 MG PO TABS: 20.0000 mg | ORAL_TABLET | Freq: Every day | ORAL | 1 refills | Status: DC

## 2018-02-22 MED ORDER — PANTOPRAZOLE SODIUM 40 MG PO TBEC: 40.0000 mg | DELAYED_RELEASE_TABLET | Freq: Two times a day (BID) | ORAL | 0 refills | Status: DC

## 2018-02-22 MED ORDER — FLUTICASONE FUROATE-VILANTEROL 100-25 MCG/INH IN AEPB: INHALATION_SPRAY | RESPIRATORY_TRACT | 0 refills | Status: DC

## 2018-02-22 MED ORDER — CELECOXIB 200 MG PO CAPS: 200.0000 mg | ORAL_CAPSULE | Freq: Every day | ORAL | 5 refills | Status: DC

## 2018-02-22 MED ORDER — NITROGLYCERIN 0.4 MG SL SUBL: 0.4000 mg | SUBLINGUAL_TABLET | SUBLINGUAL | 11 refills | Status: DC | PRN

## 2018-02-22 MED ORDER — ISOSORBIDE MONONITRATE ER 30 MG PO TB24: 30.0000 mg | ORAL_TABLET | Freq: Every day | ORAL | 1 refills | Status: DC

## 2018-02-22 MED ORDER — FLUTICASONE PROPIONATE 50 MCG/ACT NA SUSP: 2.0000 | Freq: Every day | NASAL | 2 refills | Status: DC

## 2018-02-22 MED ORDER — METOPROLOL TARTRATE 25 MG PO TABS: 25.0000 mg | ORAL_TABLET | Freq: Two times a day (BID) | ORAL | 1 refills | Status: DC

## 2018-02-22 MED ORDER — SUCRALFATE 1 G PO TABS: 1.0000 g | ORAL_TABLET | Freq: Three times a day (TID) | ORAL | 1 refills | Status: DC

## 2018-02-22 MED ORDER — ATORVASTATIN CALCIUM 40 MG PO TABS: 40.0000 mg | ORAL_TABLET | Freq: Every day | ORAL | 1 refills | Status: DC

## 2018-02-22 NOTE — Progress Notes (Signed)
Subjective:  Patient ID: [name], male    DOB: 06/14/1955  Age: 62 y.o. MRN: [id number]  CC: Medical Management of Chronic Issues   HPI [name] presents for follow-up of hypertension. Patient has no history of headache chest pain or shortness of breath or recent cough. Patient also denies symptoms of TIA such as numbness weakness lateralizing. Patient checks  blood pressure at home and has not had any elevated readings recently. Patient denies side effects from his medication. States taking it regularly. Patient also has a artery disease for which he takes beta-blocker and isosorbide/nitrate.  This seems to keep his angina under good control Patient expresses a great deal of pain due to having had multiple dental extractions.  He like to have pain medicine  Patient in for follow-up of elevated cholesterol. Doing well without complaints on current medication. Denies side effects of statin including myalgia and arthralgia and nausea. Also in today for liver function testing. Currently no chest pain, shortness of breath or other cardiovascular related symptoms noted. Depression screen [facility] 2/9 02/22/2018 02/15/2018 02/07/2018  Decreased Interest 0 0 0  Down, Depressed, Hopeless 0 0 0  PHQ - 2 Score 0 0 0  Altered sleeping - - -  Tired, decreased energy - - -  Change in appetite - - -  Feeling bad or failure about yourself  - - -  Trouble concentrating - - -  Moving slowly or fidgety/restless - - -  Suicidal thoughts - - -  PHQ-9 Score - - -  Difficult doing work/chores - - -  Some recent data might be hidden    History [name] has a past medical history of Cerebral palsy ([place]), Coronary artery disease, Depression, GERD (gastroesophageal reflux disease), Hypertension, Myocardial infarction ([place]) (2008), Scoliosis, and Seizures ([place]).   He has a past surgical history that includes Coronary angioplasty with stent; Carpal tunnel release (Right, 08/13/2016); and LEFT HEART CATH  AND CORONARY ANGIOGRAPHY (N/A, 07/10/2017).   His family history includes Alcohol abuse in his brother; CAD in his brother; CAD (age of onset: 59) in his father; Diabetes in his mother; Heart disease in his father.He reports that he has been smoking cigarettes. He has been smoking about 1.00 pack per day. He has never used smokeless tobacco. He reports that he drinks alcohol. He reports that he does not use drugs.    ROS Review of Systems  Constitutional: Negative.   HENT: Negative.   Eyes: Negative for visual disturbance.  Respiratory: Negative for cough and shortness of breath.   Cardiovascular: Negative for chest pain and leg swelling.  Gastrointestinal: Negative for abdominal pain, diarrhea, nausea and vomiting.  Genitourinary: Negative for difficulty urinating.  Musculoskeletal: Negative for arthralgias and myalgias.  Skin: Negative for rash.  Neurological: Negative for headaches.  Psychiatric/Behavioral: Negative for sleep disturbance.    Objective:  BP 123/78   Pulse 84   Temp (!) 96.3 F (35.7 C) (Oral)   Ht 6' (1.829 m)   Wt 226 lb 3.2 oz (102.6 kg)   BMI 30.68 kg/m   BP Readings from Last 3 Encounters:  02/22/18 123/78  02/15/18 124/72  02/07/18 118/79    Wt Readings from Last 3 Encounters:  02/22/18 226 lb 3.2 oz (102.6 kg)  02/15/18 219 lb (99.3 kg)  02/07/18 227 lb 3.2 oz (103.1 kg)     Physical Exam  Constitutional: He is oriented to person, place, and time. He appears well-developed and well-nourished. No distress.  HENT:  Head: Normocephalic and atraumatic.  Right Ear: External ear normal.  Left Ear: External ear normal.  Nose: Nose normal.  Mouth/Throat: Oropharynx is clear and moist.  Eyes: Pupils are equal, round, and reactive to light. Conjunctivae and EOM are normal.  Neck: Normal range of motion. Neck supple.  Cardiovascular: Normal rate, regular rhythm and normal heart sounds.  No murmur heard. Pulmonary/Chest: Effort normal and breath  sounds normal. No respiratory distress. He has no wheezes. He has no rales.  Abdominal: Soft. There is no tenderness.  Musculoskeletal: Normal range of motion.  Neurological: He is alert and oriented to person, place, and time. He has normal reflexes.  Skin: Skin is warm and dry.  Psychiatric: He has a normal mood and affect. His behavior is normal. Judgment and thought content normal.      Assessment & Plan:   [name] was seen today for medical management of chronic issues.  Diagnoses and all orders for this visit:  Coronary artery disease of native artery of native heart with stable angina pectoris (HCC) -     metoprolol tartrate (LOPRESSOR) 25 MG tablet; Take 1 tablet (25 mg total) by mouth 2 (two) times daily.  COPD exacerbation (HCC) -     albuterol (PROAIR HFA) 108 (90 Base) MCG/ACT inhaler; 1-2 puffs every 6 hours as needed wheezing or shortness of breath. -     fluticasone (FLONASE) 50 MCG/ACT nasal spray; Place 2 sprays into both nostrils daily.  Dyslipidemia, goal LDL below 70 -     atorvastatin (LIPITOR) 40 MG tablet; Take 1 tablet (40 mg total) by mouth daily at 6 PM.  Acute bronchitis with COPD (HCC) -     fluticasone furoate-vilanterol (BREO ELLIPTA) 100-25 MCG/INH AEPB; Inhale 1 puff into the lungs daily.  Essential hypertension -     lisinopril (PRINIVIL,ZESTRIL) 20 MG tablet; Take 1 tablet (20 mg total) by mouth daily. -     metoprolol tartrate (LOPRESSOR) 25 MG tablet; Take 1 tablet (25 mg total) by mouth 2 (two) times daily.  Other orders -     DULoxetine (CYMBALTA) 30 MG capsule; Take 2 capsules (60 mg total) by mouth daily. WITH A FULL STOMACH AT SUPPER TIME -     buPROPion (WELLBUTRIN SR) 150 MG 12 hr tablet; Take 1 tablet (150 mg total) by mouth 2 (two) times daily. -     isosorbide mononitrate (IMDUR) 30 MG 24 hr tablet; Take 1 tablet (30 mg total) by mouth daily. -     nitroGLYCERIN (NITROSTAT) 0.4 MG SL tablet; Place 1 tablet (0.4 mg total) under the  tongue every 5 (five) minutes x 3 doses as needed for chest pain. -     pantoprazole (PROTONIX) 40 MG tablet; Take 1 tablet (40 mg total) by mouth 2 (two) times daily. -     sucralfate (CARAFATE) 1 g tablet; Take 1 tablet (1 g total) by mouth 4 (four) times daily -  with meals and at bedtime. -     tamsulosin (FLOMAX) 0.4 MG CAPS capsule; TAKE (1) CAPSULE DAILY -     celecoxib (CELEBREX) 200 MG capsule; Take 1 capsule (200 mg total) by mouth daily. With food       I have changed [place] atorvastatin, fluticasone, isosorbide mononitrate, and lisinopril. I am also having him start on celecoxib. Additionally, I am having him maintain his acetaminophen, albuterol, DULoxetine, fluticasone furoate-vilanterol, buPROPion, metoprolol tartrate, nitroGLYCERIN, pantoprazole, sucralfate, and tamsulosin.  Allergies as of 02/22/2018  Reactions   Chantix [varenicline Tartrate] Nausea And Vomiting      Medication List        Accurate as of 02/22/18  5:41 PM. Always use your most recent med list.          acetaminophen 325 MG tablet Commonly known as:  TYLENOL Take 2 tablets (650 mg total) by mouth every 6 (six) hours as needed for mild pain or headache.   albuterol 108 (90 Base) MCG/ACT inhaler Commonly known as:  PROVENTIL HFA;VENTOLIN HFA 1-2 puffs every 6 hours as needed wheezing or shortness of breath.   atorvastatin 40 MG tablet Commonly known as:  LIPITOR Take 1 tablet (40 mg total) by mouth daily at 6 PM.   buPROPion 150 MG 12 hr tablet Commonly known as:  WELLBUTRIN SR Take 1 tablet (150 mg total) by mouth 2 (two) times daily.   celecoxib 200 MG capsule Commonly known as:  CELEBREX Take 1 capsule (200 mg total) by mouth daily. With food   DULoxetine 30 MG capsule Commonly known as:  CYMBALTA Take 2 capsules (60 mg total) by mouth daily. WITH A FULL STOMACH AT SUPPER TIME   fluticasone 50 MCG/ACT nasal spray Commonly known as:  FLONASE Place 2 sprays into both  nostrils daily.   fluticasone furoate-vilanterol 100-25 MCG/INH Aepb Commonly known as:  BREO ELLIPTA Inhale 1 puff into the lungs daily.   isosorbide mononitrate 30 MG 24 hr tablet Commonly known as:  IMDUR Take 1 tablet (30 mg total) by mouth daily.   lisinopril 20 MG tablet Commonly known as:  PRINIVIL,ZESTRIL Take 1 tablet (20 mg total) by mouth daily.   metoprolol tartrate 25 MG tablet Commonly known as:  LOPRESSOR Take 1 tablet (25 mg total) by mouth 2 (two) times daily.   nitroGLYCERIN 0.4 MG SL tablet Commonly known as:  NITROSTAT Place 1 tablet (0.4 mg total) under the tongue every 5 (five) minutes x 3 doses as needed for chest pain.   pantoprazole 40 MG tablet Commonly known as:  PROTONIX Take 1 tablet (40 mg total) by mouth 2 (two) times daily.   sucralfate 1 g tablet Commonly known as:  CARAFATE Take 1 tablet (1 g total) by mouth 4 (four) times daily -  with meals and at bedtime.   tamsulosin 0.4 MG Caps capsule Commonly known as:  FLOMAX TAKE (1) CAPSULE DAILY        Follow-up: Return if symptoms worsen or fail to improve.  [name], M.D.

## 2018-02-24 ENCOUNTER — Telehealth: Payer: Self-pay | Admitting: Family Medicine

## 2018-02-24 DIAGNOSIS — M549 Dorsalgia, unspecified: Principal | ICD-10-CM

## 2018-02-24 DIAGNOSIS — G8929 Other chronic pain: Secondary | ICD-10-CM

## 2018-02-24 NOTE — Telephone Encounter (Signed)
Pt called back about his blood pressure medication concerns when talking to the pt he was more concerned about his pain medication and if the referral to the pain clinic had been sent, looking in the chart no referral placed at this time, will send in a referral to the pain clinic,

## 2018-02-28 ENCOUNTER — Other Ambulatory Visit: Payer: Self-pay | Admitting: Licensed Clinical Social Worker

## 2018-02-28 ENCOUNTER — Telehealth: Payer: Self-pay

## 2018-02-28 ENCOUNTER — Telehealth: Payer: Self-pay | Admitting: Family Medicine

## 2018-02-28 NOTE — Patient Outreach (Signed)
Assessment:  CSW spoke via phone with client on 02/28/18.  CSW verified client identity. CSW received verbal permission from client for CSW to speak with client about client needs. Client was angry about care received from nursing assistant with home health agency ([place]).  Client made accusatory statements related to home health aide assisting him last week.. On 02/28/18, CSW called Colletta Maryland, Manufacturing engineer, at [facility] in [place], [place].  [place] verified identity of Colletta Maryland and of her role at [name].  CSW introduced self to [place].  Colletta Maryland reported to [place] that client had called [facility] multiple times last Friday and had called [facility] multiple time over the past weekend. Colletta Maryland reported that client had been verbally abusive to multiple staff members at [facility] during his recent calls to agency.  Colletta Maryland talked with client about in home care care for client and said that she would reassign his current aide .  [facility] will have to determine if they can continue to provide in home care for client.  Colletta Maryland reported that client was accusatory of behavior of home health aide during this past week.  CSW informed Colletta Maryland that CSW would talk with representative at [facility] to inform representative at practice of client's recent behavior.  Colletta Maryland agreed to this plan. CSW thanked [place] for phone call with CSW.  CSW then called [place] on 02/28/18 and spoke via phone with Nira Conn, Physiological scientist. CSW informed Nira Conn of information Colletta Maryland had shared with CSW regarding client behavior.  Nira Conn said she would share this information conveyed by CSW with Dr. Livia Snellen.  CSW thanked [place] for phone call with CSW on 02/28/18.    Plan:  Pertaining to client care plan, client to continue to talk with CSW in next 30 days regarding community resources for transport assistance.  CSW to call client in 2 weeks to  assess client needs.  [name].Nikka Hakimian MSW, [name] Licensed Clinical Social Worker [facility] Care Management [phone]

## 2018-02-28 NOTE — Telephone Encounter (Signed)
Today [facility] social worker  with [facility] called with concerns with Mr. Wanat over the weekend. Mr. Lynelle Doctor stated that Mr. Tilghman called the [facility] staff over 30 times this weekend cussing them and being verbally abusive to the staff, Mr. Jurney was informed that he may be dc from home health if he continued to call and be like that, [facility] called the police to do a well fare check Mr.Byron seemed to be ok with the police, Dr. Livia Snellen was informed with Mr. [facility] concerns and Mr Balandran was called and was informed that Dr. Livia Snellen would like to see he him to see if his medication needed to be adjusted, Mr Made stated he was not coming in because he was broke and that he did not want to come in because of some on calling and telling information on him. Dr. Livia Snellen was informed that Mr.Made refused to come in and be seen .

## 2018-03-01 ENCOUNTER — Telehealth: Payer: Self-pay | Admitting: Family Medicine

## 2018-03-01 NOTE — Telephone Encounter (Signed)
Attempted to contact patient to encourage to come to appt tomorrow. Multiple call made with no answer

## 2018-03-01 NOTE — Telephone Encounter (Signed)
Returned call to Education officer, [employer] [place] [facility]).  Nicki Reaper stated that he received several phone calls from patient yesterday.  Nicki Reaper states that patient has been very belligerent with home health company [facility]).  He as been make accusation towards the company and the most recent CNA.  Scott spoke with the manage of [facility] and at this point [facility] may not be able to continue providing care for patient.  Nicki Reaper states that the patient has been more angry recently and declines taking any medication for mood

## 2018-03-01 NOTE — Telephone Encounter (Signed)
I asked Heather to set him up for appointment. He is on the schedule for tomorrow, but said he wouldn't come. Please call and encourage him to come to the appointment Thanks, WS.

## 2018-03-02 ENCOUNTER — Encounter: Payer: Self-pay | Admitting: Physical Medicine & Rehabilitation

## 2018-03-02 ENCOUNTER — Ambulatory Visit: Payer: Medicare Other | Admitting: Family Medicine

## 2018-03-03 ENCOUNTER — Ambulatory Visit: Payer: Self-pay | Admitting: Licensed Clinical Social Worker

## 2018-03-08 ENCOUNTER — Ambulatory Visit (INDEPENDENT_AMBULATORY_CARE_PROVIDER_SITE_OTHER): Payer: Medicare Other | Admitting: *Deleted

## 2018-03-08 ENCOUNTER — Encounter: Payer: Self-pay | Admitting: *Deleted

## 2018-03-08 VITALS — BP 112/60 | HR 76 | Ht 70.0 in | Wt 226.0 lb

## 2018-03-08 DIAGNOSIS — Z0000 Encounter for general adult medical examination without abnormal findings: Secondary | ICD-10-CM | POA: Diagnosis not present
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
## 2018-03-08 NOTE — Patient Instructions (Addendum)
[name] , Thank you for taking time to come for your Medicare Wellness Visit. I appreciate your ongoing commitment to your health goals. Please review the following plan we discussed and let me know if I can assist you in the future.   These are the goals we discussed: Goals    . Exercise 150 min/wk Moderate Activity    . Have 3 meals a day       This is a list of the screening recommended for you and due dates:  Health Maintenance  Topic Date Due  . Tetanus Vaccine  12/04/2021  . Colon Cancer Screening  04/18/2025  . Flu Shot  Completed  .  Hepatitis C: One time screening is recommended by Center for Disease Control  (CDC) for  adults born from [birth year] through 1963.   Completed  . HIV Screening  Completed     [address] in [place] at [place] in need of groceries can register with [place] for a free box of high quality food. This box will feed a family of four. Boxes are available on the 2nd and last Saturday of each month. The doors open at 9 am and pantry is from 10:00-12:00 at our store in [place], [place]. [facility] [address] - A warm welcome and a hot meal awaits you at [facility]. Free hot lunches are served Wednesday-Saturday 12pm-2pm. We also do an unofficial breakfast on Saturdays from 9-11am. Come join [place] for good food and great fellowship with members of the community who care about you. [employer] - If you are in need of gently used items of clothing for the entire family, housewares, home decor and more, then check out [place]. Ask about participating in our Learn to [employer] to earn points to use towards purchases in the store.  The store is open Tuesday - 12-5pm Wednesday & Thursday - 10-5 pm Friday - 10-7 pm Saturday - 9-5 pm Closed on Sunday & Monday   8 Easy Exercises You Can Do Sitting Down  Got a chair? Then you're ready for this sit-down, total-body  workout!.                            Safety precaution: Pay attention to your body during the movements - if anything hurts or causes pain, stop immediately. And check with your doctor first before beginning this, or any, exercise program.   Sunshine Arm Circles Seated in a chair with good posture, hold a ball in both hands with arms extended above your head and/or in front of you, keeping elbows slightly bent. Visualizing the face of a clock out in front of you, begin by holding arms up overhead at 12 o'clock. Circle the ball around to go all the way around the clock in a controlled, fluid motion. When you've reached 12 o'clock again, reverse directions and circle the opposite way. Keep alternating circle directions for 8 repetitions. Rest. Do another set of 8 repetitions.  Modification: A ball is not required for this exercise. Imagine that you are holding a ball while performing the motion. If it is difficult to bring your arms overhead, extend them out in front of you and move arms as if drawing a circle on the wall with or without the ball.   Tummy Twists Seated in a chair with good posture, hold a ball with both  hands close to the body, with elbows bent and pulled in close to the ribcage. Slowly rotate your torso to the right as far as you comfortably can, being sure to keep the rest of your body still and stable. Rotate back to the center and repeat in the opposite direction. Do this 8 times, with two twists counting as a full set. Rest. Do another 8 sets (two twists each). Modification: A ball is not required for this exercise. Imagine you are holding a ball while performing the motion, or hold a small object such as a can of soup or water bottle to add resistance   Ball Chest Press Seated in a chair with good posture, hold a ball with both hands at chest level, palms facing toward each other and elbows bent. Avoid bending forward by keeping your  shoulders back at all times. Squeeze the ball slightly as you push the ball away from you in a fluid motion, taking about 2 seconds to extend the arms. Squeeze your shoulder blades together as you pull the ball back toward your chest. Repeat the push and pull motion 10 to 15 times. Rest. Do another set of 10 to 15 repetitions.  Modification: For a greater challenge, add a Tai Chi feel by standing with one leg slightly in front of the other (with a chair nearby if needed for extra balance) and slowly rocking the entire body forward and back as you push the ball away and pull back in.     Front Arm Raises In a seated position with good posture, hold a ball in both hands with palms facing each other. Extend the arms out in front of your body, keeping your elbows slightly bent. Starting with the ball lowered toward the knees, slowly raise your arms to lift the ball up to shoulder level (no higher), then lower the ball back to the starting position, taking about 2 to 3 seconds to lift and lower. Repeat 10 to 15 times. Rest. Do another set of 10 to 15 repetitions. Modification: A ball is not required for this exercise. Imagine you are holding a ball as you perform the motion, or hold a small object, such as a can of soup or water bottle for added resistance.        Inner Thigh Squeezes Sitting toward the edge of a chair with good posture and knees bent, place a ball in between your knees; press the knees together to squeeze the ball, taking about 1 to 2 seconds to squeeze. You should feel the resistance in your inner thighs. Slowly release, keeping slight tension on the ball so that it does not fall. Repeat 8 to 10 times. Rest. Do another set of 8 to 10 repetitions. Modification: For a greater challenge, change the count of the squeezes by squeezing the ball and holding for 5 seconds, then releasing again. Or, do short, quick pulsing squeezes.     Knee Extensions Sitting toward  the edge of a chair with good posture and bent knees, hold on to the sides of the chair with your hands. Extend the right knee out so that the toes come up toward the ceiling, being sure to keep the knee slightly bent without locking it through the entire movement. Lower the leg back to a bent position and repeat this movement 8 to 10 times, using about 2 seconds each to lift and lower the leg. Switch to the opposite leg and perform 8 to 10 repetitions. Rest briefly. Do another  set of 8 to 10 repetitions for each leg. Modification: If you are more advanced, sitting in the same position as above, extend one leg out in front of you with toes pointed to the ceiling. Lift and lower the entire leg only as high as you comfortably can, keeping the knee slightly bent. The longer lever adds difficulty to the exercise.   Elbow to Knee Seated toward the edge of a chair with good posture and knees bent, start with your right arm extended up overhead. Slowly lift the left knee up as you lower your right elbow down toward your left knee, taking about 2 seconds to lower down. Try not to bend over at the waist. Release and go back to the starting position. Repeat 8 to 10 times. Switch sides and do 8 to 10 repetitions, pulling one elbow to the opposite knee. Rest. Do another set of 8 to 10 repetitions on each side. Modification: Try this (with a chair nearby for balance) exercise in a standing position for an increased range of motion.     Overhead Arm Extensions Seated in a chair with good posture, hold a ball with both hands and raise it up over your head, with arms extended without locking the elbows. Keeping the elbows pulled in toward the head, slowly bend the elbows to lower the ball down along the back of the neck, using about 2 seconds to go down, then 2 seconds to push the ball back up over your head. Repeat 8 to 10 times. Rest. Do another set of 8 to 10 repetitions. Modification: Try  seated tricep extensions (ball not required for this modification). Bending slightly forward with elbows tucked into your sides, slowly extend the elbows so that your forearms go back behind you, keeping the elbows pulled up and in for the entire movement. Return to the starting position and repeat. Hold soup cans or small weights for added resistance.

## 2018-03-10 ENCOUNTER — Other Ambulatory Visit: Payer: Self-pay | Admitting: Licensed Clinical Social Worker

## 2018-03-10 NOTE — Patient Outreach (Signed)
[facility]) Care Management  [facility] Social Work  03/10/2018  [name] Apr 05, 1956 [id number]  Subjective:    Objective:   Encounter Medications:  Outpatient Encounter Medications as of 03/10/2018  Medication Sig  . acetaminophen (TYLENOL) 325 MG tablet Take 2 tablets (650 mg total) by mouth every 6 (six) hours as needed for mild pain or headache.  . albuterol (PROAIR HFA) 108 (90 Base) MCG/ACT inhaler 1-2 puffs every 6 hours as needed wheezing or shortness of breath.  [name] atorvastatin (LIPITOR) 40 MG tablet Take 1 tablet (40 mg total) by mouth daily at 6 PM.  . buPROPion (WELLBUTRIN SR) 150 MG 12 hr tablet Take 1 tablet (150 mg total) by mouth 2 (two) times daily.  . celecoxib (CELEBREX) 200 MG capsule Take 1 capsule (200 mg total) by mouth daily. With food  . DULoxetine (CYMBALTA) 30 MG capsule Take 2 capsules (60 mg total) by mouth daily. WITH A FULL STOMACH AT SUPPER TIME  . fluticasone (FLONASE) 50 MCG/ACT nasal spray Place 2 sprays into both nostrils daily.  . fluticasone furoate-vilanterol (BREO ELLIPTA) 100-25 MCG/INH AEPB Inhale 1 puff into the lungs daily.  . isosorbide mononitrate (IMDUR) 30 MG 24 hr tablet Take 1 tablet (30 mg total) by mouth daily.  [name] lisinopril (PRINIVIL,ZESTRIL) 20 MG tablet Take 1 tablet (20 mg total) by mouth daily.  . metoprolol tartrate (LOPRESSOR) 25 MG tablet Take 1 tablet (25 mg total) by mouth 2 (two) times daily.  . nitroGLYCERIN (NITROSTAT) 0.4 MG SL tablet Place 1 tablet (0.4 mg total) under the tongue every 5 (five) minutes x 3 doses as needed for chest pain.  . pantoprazole (PROTONIX) 40 MG tablet Take 1 tablet (40 mg total) by mouth 2 (two) times daily.  . sucralfate (CARAFATE) 1 g tablet Take 1 tablet (1 g total) by mouth 4 (four) times daily -  with meals and at bedtime.  . tamsulosin (FLOMAX) 0.4 MG CAPS capsule TAKE (1) CAPSULE DAILY   No facility-administered encounter medications on file as of 03/10/2018.      Functional Status:  In your present state of health, do you have any difficulty performing the following activities: 03/08/2018 07/23/2017  Hearing? N N  Vision? N N  Difficulty concentrating or making decisions? N Y  Walking or climbing stairs? N Y  Dressing or bathing? Y N  Comment some limitations due to cerebral palsy but he is capable of dressing and bathing himself -  Doing errands, shopping? N N  Preparing Food and eating ? N N  Using the Toilet? N N  In the past six months, have you accidently leaked urine? N N  Do you have problems with loss of bowel control? N N  Managing your Medications? N Y  Comment prepacked from pill pack -  Managing your Finances? N Y  Housekeeping or managing your Housekeeping? N Y  Some recent data might be hidden    Fall/Depression Screening:  PHQ 2/9 Scores 02/22/2018 02/15/2018 02/07/2018 01/05/2018 11/25/2017 11/03/2017 07/23/2017  PHQ - 2 Score 0 0 0 0 0 0 0  PHQ- 9 Score - - - - - - -    Assessment:   CSW traveled to home of client in [place], [place] on 03/10/18.  CSW met with client on 03/10/18 at home of client for a routine home visit. Client is attending scheduled medical appointments. Client has prescribed medications and is taking medications as prescribed. Client has been receiving transportation support from [facility] in taking  client to and from client appointments with primary care doctor. Client is in process of communicating with a home health company to obtain home health assistance for client. Client has been receiving in home aide services previously. Client did not mention any pain issues. CSW talked with client about client care plan. CSW encouraged client to communicate with CSW in next 30 days to talk about community resources for transport assistance for client. CSW talked with client about transport help for client through client's Medicaid benefit. CSW helped client complete paperwork for client to take to medical appointment with Dr.  Naaman Plummer on 04/19/18.  CSW helped client call to check on his balance for his food stamp card.  CSW helped client to call Department of Motor Vehicles to inquire about driver's license renewal for client. [name] has informed client that client has been assigned to new home health agency:  [facility] (phone: [phone]). Client has called agency and is waiting on return call from that home health agency.  CSW thanked client for home visit with CSW on 03/10/18   Plan:   Client to communicate with CSW in next 30 days to discuss community resources for transport assistance for client.    CSW to call client in 4 weeks to assess client needs.  [name].Amira Podolak MSW, [name] Licensed Clinical Social Worker [facility] Care Management [phone]

## 2018-03-14 ENCOUNTER — Ambulatory Visit: Payer: Self-pay | Admitting: Licensed Clinical Social Worker

## 2018-03-21 ENCOUNTER — Other Ambulatory Visit: Payer: Self-pay

## 2018-03-21 ENCOUNTER — Ambulatory Visit: Payer: Self-pay | Admitting: Family Medicine

## 2018-03-21 NOTE — Patient Outreach (Signed)
[facility]) Care Management  03/21/2018  [name] Apr 28, 1955 [id number]   Request received from CSW, [name], to arrange transportation to appointment with Dr. Livia Snellen on 03/28/18 @ 9:40 AM.  Transportation arranged via [place], [place] Worker [phone]

## 2018-03-28 ENCOUNTER — Ambulatory Visit (INDEPENDENT_AMBULATORY_CARE_PROVIDER_SITE_OTHER): Payer: Medicare Other | Admitting: Family Medicine

## 2018-03-28 ENCOUNTER — Encounter: Payer: Self-pay | Admitting: Family Medicine

## 2018-03-28 VITALS — BP 123/70 | HR 76 | Temp 97.0°F | Ht 70.0 in | Wt 224.0 lb

## 2018-03-28 DIAGNOSIS — I25118 Atherosclerotic heart disease of native coronary artery with other forms of angina pectoris: Secondary | ICD-10-CM | POA: Diagnosis not present

## 2018-03-28 DIAGNOSIS — E785 Hyperlipidemia, unspecified: Secondary | ICD-10-CM

## 2018-03-28 DIAGNOSIS — I10 Essential (primary) hypertension: Secondary | ICD-10-CM | POA: Diagnosis not present

## 2018-03-28 DIAGNOSIS — M549 Dorsalgia, unspecified: Secondary | ICD-10-CM | POA: Diagnosis not present

## 2018-03-28 DIAGNOSIS — G8929 Other chronic pain: Secondary | ICD-10-CM

## 2018-03-28 DIAGNOSIS — I209 Angina pectoris, unspecified: Secondary | ICD-10-CM | POA: Diagnosis not present

## 2018-03-28 MED ORDER — ISOSORBIDE MONONITRATE ER 30 MG PO TB24: 30.0000 mg | ORAL_TABLET | Freq: Every day | ORAL | 1 refills | Status: DC

## 2018-03-28 MED ORDER — METOPROLOL TARTRATE 25 MG PO TABS: 25.0000 mg | ORAL_TABLET | Freq: Two times a day (BID) | ORAL | 1 refills | Status: DC

## 2018-03-28 MED ORDER — PANTOPRAZOLE SODIUM 40 MG PO TBEC: 40.0000 mg | DELAYED_RELEASE_TABLET | Freq: Two times a day (BID) | ORAL | 1 refills | Status: DC

## 2018-03-28 MED ORDER — LISINOPRIL 20 MG PO TABS: 20.0000 mg | ORAL_TABLET | Freq: Every day | ORAL | 1 refills | Status: DC

## 2018-03-28 MED ORDER — BUPROPION HCL ER (SR) 150 MG PO TB12: 150.0000 mg | ORAL_TABLET | Freq: Two times a day (BID) | ORAL | 5 refills | Status: DC

## 2018-03-28 MED ORDER — DULOXETINE HCL 30 MG PO CPEP: 60.0000 mg | ORAL_CAPSULE | Freq: Every day | ORAL | 1 refills | Status: DC

## 2018-03-28 MED ORDER — ATORVASTATIN CALCIUM 40 MG PO TABS: 40.0000 mg | ORAL_TABLET | Freq: Every day | ORAL | 1 refills | Status: DC

## 2018-03-28 NOTE — Progress Notes (Signed)
Subjective:  Patient ID: [name], male    DOB: 1956-04-11  Age: 62 y.o. MRN: [id number]  CC: Follow-up   HPI [name] presents for patient had to stop the Carafate and Celebrex due to excessive headache and a feeling of dysphoria and being dazed.  Having some issues with spells of chest pain which is substernal but episodic.  Somewhat related to exertion.  No radiation to the jaw or shoulder.  It does seem to go into the precordium.  Of note is patient is a very poor historian due to cerebral palsy.   Follow-up of hypertension. Patient has no history of headache chest pain or shortness of breath or recent cough. Patient also denies symptoms of TIA such as numbness weakness lateralizing. Patient checks  blood pressure at home and has not had any elevated readings recently. Patient denies side effects from his medication. States taking it regularly.   Patient also having some issues with chronic back pain as usual.  No change.  Since [name] could not take the Celebrex there was not the improvement that was anticipated.  Depression screen [facility] 2/9 03/28/2018 02/22/2018 02/15/2018  Decreased Interest 0 0 0  Down, Depressed, Hopeless 0 0 0  PHQ - 2 Score 0 0 0  Altered sleeping - - -  Tired, decreased energy - - -  Change in appetite - - -  Feeling bad or failure about yourself  - - -  Trouble concentrating - - -  Moving slowly or fidgety/restless - - -  Suicidal thoughts - - -  PHQ-9 Score - - -  Difficult doing work/chores - - -  Some recent data might be hidden    History [name] has a past medical history of Cerebral palsy ([place]), Coronary artery disease, Depression, GERD (gastroesophageal reflux disease), Hypertension, Myocardial infarction ([place]) (2008), Scoliosis, and Seizures ([place]).   [name] has a past surgical history that includes Coronary angioplasty with stent; Carpal tunnel release (Right, 08/13/2016); and LEFT HEART CATH AND CORONARY ANGIOGRAPHY (N/A, 07/10/2017).   His  family history includes Alcohol abuse in his brother; CAD in his brother and sister; CAD (age of onset: 62) in his father; Diabetes in his mother; Heart disease in his father; Heart failure in his sister.[name] reports that [name] has been smoking cigarettes. [name] has a 41.00 pack-year smoking history. [name] has never used smokeless tobacco. [name] reports that [name] drinks alcohol. [name] reports that [name] does not use drugs.    ROS Review of Systems  Constitutional: Negative.   HENT: Negative.   Eyes: Negative for visual disturbance.  Respiratory: Negative for cough and shortness of breath.   Cardiovascular: Negative for chest pain and leg swelling.  Gastrointestinal: Negative for abdominal pain, diarrhea, nausea and vomiting.  Genitourinary: Negative for difficulty urinating.  Musculoskeletal: Negative for arthralgias and myalgias.  Skin: Negative for rash.  Neurological: Negative for headaches.  Psychiatric/Behavioral: Negative for sleep disturbance.    Objective:  BP 123/70 (BP Location: Left Arm)   Pulse 76   Temp (!) 97 F (36.1 C) (Oral)   Ht 5' 10" (1.778 m)   Wt 224 lb (101.6 kg)   BMI 32.14 kg/m   BP Readings from Last 3 Encounters:  03/28/18 123/70  03/08/18 112/60  02/22/18 123/78    Wt Readings from Last 3 Encounters:  03/28/18 224 lb (101.6 kg)  03/08/18 226 lb (102.5 kg)  02/22/18 226 lb 3.2 oz (102.6 kg)     Physical Exam  Constitutional: [name]  is oriented to person, place, and time. [name] appears well-developed and well-nourished. No distress.  HENT:  Head: Normocephalic and atraumatic.  Right Ear: External ear normal.  Left Ear: External ear normal.  Nose: Nose normal.  Mouth/Throat: Oropharynx is clear and moist.  Eyes: Pupils are equal, round, and reactive to light. Conjunctivae and EOM are normal.  Neck: Normal range of motion. Neck supple.  Cardiovascular: Normal rate, regular rhythm and normal heart sounds.  No murmur heard. Pulmonary/Chest: Effort normal and breath  sounds normal. No respiratory distress. [name] has no wheezes. [name] has no rales.  Abdominal: Soft. There is no tenderness.  Musculoskeletal: Normal range of motion.  Neurological: [name] is alert and oriented to person, place, and time. [name] has normal reflexes.  Skin: Skin is warm and dry.  Psychiatric: [name] has a normal mood and affect. His behavior is normal. Judgment and thought content normal.      Assessment & Plan:   [name] was seen today for follow-up.  Diagnoses and all orders for this visit:  Chronic back pain, unspecified back location, unspecified back pain laterality -     CMP14+EGFR -     CBC with Differential/Platelet  Essential hypertension -     metoprolol tartrate (LOPRESSOR) 25 MG tablet; Take 1 tablet (25 mg total) by mouth 2 (two) times daily. -     lisinopril (PRINIVIL,ZESTRIL) 20 MG tablet; Take 1 tablet (20 mg total) by mouth daily.  Coronary artery disease of native artery of native heart with stable angina pectoris (HCC) -     metoprolol tartrate (LOPRESSOR) 25 MG tablet; Take 1 tablet (25 mg total) by mouth 2 (two) times daily. -     Ambulatory referral to Cardiology  Dyslipidemia, goal LDL below 70 -     atorvastatin (LIPITOR) 40 MG tablet; Take 1 tablet (40 mg total) by mouth daily at 6 PM.  Angina pectoris (HCC) -     Ambulatory referral to Cardiology  Other orders -     pantoprazole (PROTONIX) 40 MG tablet; Take 1 tablet (40 mg total) by mouth 2 (two) times daily. -     DULoxetine (CYMBALTA) 30 MG capsule; Take 2 capsules (60 mg total) by mouth daily. WITH A FULL STOMACH AT SUPPER TIME -     isosorbide mononitrate (IMDUR) 30 MG 24 hr tablet; Take 1 tablet (30 mg total) by mouth daily. -     buPROPion (WELLBUTRIN SR) 150 MG 12 hr tablet; Take 1 tablet (150 mg total) by mouth 2 (two) times daily.       I am having [name] maintain his acetaminophen, albuterol, fluticasone, fluticasone furoate-vilanterol, nitroGLYCERIN, sucralfate, tamsulosin,  celecoxib, metoprolol tartrate, atorvastatin, lisinopril, pantoprazole, DULoxetine, isosorbide mononitrate, and buPROPion.  Allergies as of 03/28/2018      Reactions   Chantix [varenicline Tartrate] Nausea And Vomiting      Medication List        Accurate as of 03/28/18 10:47 AM. Always use your most recent med list.          acetaminophen 325 MG tablet Commonly known as:  TYLENOL Take 2 tablets (650 mg total) by mouth every 6 (six) hours as needed for mild pain or headache.   albuterol 108 (90 Base) MCG/ACT inhaler Commonly known as:  PROVENTIL HFA;VENTOLIN HFA 1-2 puffs every 6 hours as needed wheezing or shortness of breath.   atorvastatin 40 MG tablet Commonly known as:  LIPITOR Take 1 tablet (40 mg total) by mouth daily at 6   PM.   buPROPion 150 MG 12 hr tablet Commonly known as:  WELLBUTRIN SR Take 1 tablet (150 mg total) by mouth 2 (two) times daily.   celecoxib 200 MG capsule Commonly known as:  CELEBREX Take 1 capsule (200 mg total) by mouth daily. With food   DULoxetine 30 MG capsule Commonly known as:  CYMBALTA Take 2 capsules (60 mg total) by mouth daily. WITH A FULL STOMACH AT SUPPER TIME   fluticasone 50 MCG/ACT nasal spray Commonly known as:  FLONASE Place 2 sprays into both nostrils daily.   fluticasone furoate-vilanterol 100-25 MCG/INH Aepb Commonly known as:  BREO ELLIPTA Inhale 1 puff into the lungs daily.   isosorbide mononitrate 30 MG 24 hr tablet Commonly known as:  IMDUR Take 1 tablet (30 mg total) by mouth daily.   lisinopril 20 MG tablet Commonly known as:  PRINIVIL,ZESTRIL Take 1 tablet (20 mg total) by mouth daily.   metoprolol tartrate 25 MG tablet Commonly known as:  LOPRESSOR Take 1 tablet (25 mg total) by mouth 2 (two) times daily.   nitroGLYCERIN 0.4 MG SL tablet Commonly known as:  NITROSTAT Place 1 tablet (0.4 mg total) under the tongue every 5 (five) minutes x 3 doses as needed for chest pain.   pantoprazole 40 MG  tablet Commonly known as:  PROTONIX Take 1 tablet (40 mg total) by mouth 2 (two) times daily.   sucralfate 1 g tablet Commonly known as:  CARAFATE Take 1 tablet (1 g total) by mouth 4 (four) times daily -  with meals and at bedtime.   tamsulosin 0.4 MG Caps capsule Commonly known as:  FLOMAX TAKE (1) CAPSULE DAILY        Follow-up: Return in about 6 months (around 09/27/2018).  Warren [name], M.D. 

## 2018-03-29 ENCOUNTER — Other Ambulatory Visit: Payer: Self-pay

## 2018-03-29 NOTE — Patient Outreach (Signed)
[facility]) Care Management  03/29/2018  [name] Apr 07, 1956 [id number]   Request received from [place], [name], to arrange transportation for appointment with Dr. Jenkins Rouge on 03/30/18 @ 10:00 AM.  Transportation arranged via [place], [place] Worker [phone]

## 2018-03-31 ENCOUNTER — Ambulatory Visit: Payer: Self-pay | Admitting: Licensed Clinical Social Worker

## 2018-03-31 ENCOUNTER — Encounter: Payer: Self-pay | Admitting: Licensed Clinical Social Worker

## 2018-03-31 ENCOUNTER — Other Ambulatory Visit: Payer: Self-pay | Admitting: Licensed Clinical Social Worker

## 2018-03-31 DIAGNOSIS — R482 Apraxia: Secondary | ICD-10-CM

## 2018-03-31 DIAGNOSIS — Z550 Illiteracy and low-level literacy: Secondary | ICD-10-CM

## 2018-03-31 DIAGNOSIS — F411 Generalized anxiety disorder: Secondary | ICD-10-CM

## 2018-03-31 DIAGNOSIS — G809 Cerebral palsy, unspecified: Secondary | ICD-10-CM

## 2018-03-31 NOTE — Patient Instructions (Signed)
CSW will continue working with you at office of Dr. Livia Snellen.   [name] was given information about Chronic Care Management services today including:  1. CCM service includes personalized support from designated clinical staff supervised by his physician, including individualized plan of care and coordination with other care providers 2. 24/7 contact phone numbers for assistance for urgent and routine care needs. 3. Service will only be billed when office clinical staff spend 20 minutes or more in a month to coordinate care. 4. Only one practitioner may furnish and bill the service in a calendar month. 5. The patient may stop CCM services at any time (effective at the end of the month) by phone call to the office staff. 6. The patient will be responsible for cost sharing (co-pay) of up to 20% of the service fee (after annual deductible is met).  Patient agreed to services and verbal consent obtained.   The patient verbalized understanding of instructions provided today and declined a print copy of patient instruction materials.   [name].Seidy Labreck MSW, [name] Licensed Clinical Social Worker [facility] Care Management [phone]

## 2018-03-31 NOTE — Chronic Care Management (AMB) (Signed)
  Chronic Care Management   Social Work Initial Visit Note  03/31/2018 Name: [name] MRN: [id number] DOB: [birth year]  Referred by: [name], MD Reason for referral : Chronic Care Management (Initial CCM intake)   Subjective: "Yes I still want you to work with me"   Assessment:: [name] is a 62 year old primary care patient of Dr. [name]. [name] has a medical history which includes Infantile Cerebral Palsy with cognitive deficits, GAD, illiteracy, apraxia of speech .  CSW has been following [name] in the community for a number of months through [facility] [place] care management community services. Today, [name] has agreed to [facility] services as outline below.   Goals Addressed            This Visit's Progress   . "I need help with getting food" (pt-stated)       Clinical Goal(s):   Client will express understanding of food resources agencies in area in next 30 days and understand process for applying for food assistance.  Interventions: CSW talked with client about [facility] [employer] [facility] food pantry and about [place] talked with client about his using [place] to transport him to [employer].                             CSW encouraged client to seek family assistance, when possible, in meeting food needs of client.     . : " I need to get through all this legal stuff " (pt-stated)       Clinical Goal(s):  Over the next 30 days, patient will verbalize understanding of upcoming legal proceedings including court date, contact information for attorney, and transport arrangements to court  Interventions:  CSW discussed his communicating with attorney to discuss legal needs of client CSW discussed transport arrangements with client to and from court hearing CSW discussed financial needs of client and plan for paying attorney fees      Plan: CSW will continue to follow  [name] in the CCM clinic.[name] and CSW will continue weekly phone contacts.   [name] was given information about Chronic Care Management services today including:  1. CCM service includes personalized support from designated clinical staff supervised by his physician, including individualized plan of care and coordination with other care providers 2. 24/7 contact phone numbers for assistance for urgent and routine care needs. 3. Service will only be billed when office clinical staff spend 20 minutes or more in a month to coordinate care. 4. Only one practitioner may furnish and bill the service in a calendar month. 5. The patient may stop CCM services at any time (effective at the end of the month) by phone call to the office staff. 6. The patient will be responsible for cost sharing (co-pay) of up to 20% of the service fee (after annual deductible is met).  Patient agreed to services and verbal consent obtained.  [name].Alonnah Lampkins MSW, [name] Licensed Clinical Social Worker [facility] Care Management [phone]

## 2018-03-31 NOTE — Patient Outreach (Signed)
Assessment:  CSW spoke via phone with client. CSW verified client identity and received verbal permission from client for CSW to speak with client about client needs. CSW talked with client about CCM program through [facility].  CSW talked with [name] about discharging client from [facility] services on 03/31/18. CSW asked if he would like to enroll in CCM program through [facility] and patient is considering, having stated that he does wish to continue to work with me as CSW for psychosocial and care coordination needs. Client consented to enrollment in CCM program and to discharging from [facility]. Of note, client is attending scheduled medical appointments. He met this week for appointment with Dr. Jenkins Rouge, cardiologist. He has home health aide who is assisting him weekly in the home as scheduled. He is using [facility] for some of his transport needs. He has prescribed medications and is taking medications as prescribed.     Plan:  Follow up in CCM clinic.  [name].Coburn Knaus MSW, [name] Licensed Clinical Social Worker [facility] Care Management [phone]

## 2018-04-05 ENCOUNTER — Other Ambulatory Visit: Payer: Self-pay | Admitting: Family Medicine

## 2018-04-05 ENCOUNTER — Telehealth: Payer: Self-pay | Admitting: Family Medicine

## 2018-04-05 MED ORDER — FEXOFENADINE HCL 180 MG PO TABS: 180.0000 mg | ORAL_TABLET | Freq: Every day | ORAL | 11 refills | Status: DC

## 2018-04-05 NOTE — Telephone Encounter (Signed)
Pt notified of RX Verbalizes understanding 

## 2018-04-05 NOTE — Telephone Encounter (Signed)
I sent in the requested prescription 

## 2018-04-05 NOTE — Telephone Encounter (Signed)
Pt states when he saw you last you were going to call something in for his "runny nose" do you remember this?

## 2018-04-08 ENCOUNTER — Telehealth: Payer: Self-pay

## 2018-04-11 ENCOUNTER — Telehealth: Payer: Self-pay

## 2018-04-11 ENCOUNTER — Ambulatory Visit: Payer: Self-pay | Admitting: Licensed Clinical Social Worker

## 2018-04-11 ENCOUNTER — Other Ambulatory Visit: Payer: Self-pay

## 2018-04-11 DIAGNOSIS — G809 Cerebral palsy, unspecified: Secondary | ICD-10-CM

## 2018-04-11 DIAGNOSIS — Z550 Illiteracy and low-level literacy: Secondary | ICD-10-CM

## 2018-04-11 DIAGNOSIS — F411 Generalized anxiety disorder: Secondary | ICD-10-CM

## 2018-04-11 NOTE — Patient Outreach (Signed)
[facility]) Care Management  04/11/2018  [name] [birth year] [id number]   Transportation arranged via [place] to appointment with Dr. Alger Simons on 04/19/18 @ 10:20 AM  [name], [place] Worker [phone]

## 2018-04-11 NOTE — Patient Instructions (Signed)
Licensed Clinical Social Worker Visit Information  Goals Addressed            This Visit's Progress   . "I need help with getting food" (pt-stated)       Clinical Goal(s):   Client will express understanding of food resources agencies in area in next 30 days and understand process for applying for food assistance.  Interventions: Discussed impact of dental health needs on nutrition. Collaboration with nurse case manager regarding nutrition management underway.       . : " I need to get through all this legal stuff " (pt-stated)       Clinical Goal(s):  Over the next 30 days, patient will verbalize understanding of upcoming legal proceedings including court date, contact information for attorney, and transport arrangements to court  Interventions:  CSW discussed his communicating with attorney to discuss legal needs of client CSW discussed transport arrangements with client to and from court hearing        Patient Instructions:  1.Have home health aide call CSW with information about next court hearing date and time.   Materials Provided: none-patient is illiterate, phone call today  The patient verbalized understanding of instructions provided today and declined a print copy of patient instruction materials.   [name].Nathanyal Ashmead MSW, [name] Licensed Clinical Social Worker [facility] Care Management [phone]

## 2018-04-11 NOTE — Chronic Care Management (AMB) (Signed)
  Chronic Care Management    Clinical Social Work Follow Up Note  04/11/2018 Name: [name] MRN: [id number] DOB: Nov 28, 1955   Referred by: Dr. Livia Snellen for assessment of psychosocial needs and support. I spoke via phone today with patient.  SUBJECTIVE:  "I am worried about this legal stuff"  OBJECTIVE:  Last CCM Appointment: 03/31/18  Depression screen PHQ 2/9 04/11/2018  Decreased Interest 1  Down, Depressed, Hopeless 1  PHQ - 2 Score 2  Altered sleeping 0  Tired, decreased energy 1  Change in appetite 0  Feeling bad or failure about yourself  1  Trouble concentrating 1  Moving slowly or fidgety/restless 0  Suicidal thoughts 0  PHQ-9 Score 5  Difficult doing work/chores -    GAD:  GAD 7 : Generalized Anxiety Score 05/01/2016 03/31/2016  Nervous, Anxious, on Edge 1 3  Control/stop worrying 1 3  Worry too much - different things 1 3  Trouble relaxing 0 2  Restless 0 2  Easily annoyed or irritable 0 0  Afraid - awful might happen 0 0  Total GAD 7 Score 3 13  Anxiety Difficulty Not difficult at all Not difficult at all   Goals Addressed            This Visit's Progress   . "I need help with getting food" (pt-stated)       [name] has history of community support with client for this stated need.  Clinical Goal(s):   Client will express understanding of food resources agencies in area in next 30 days and understand process for applying for food assistance.  Interventions: Discussed impact of dental health needs on nutrition. Collaboration with nurse case manager regarding nutrition management underway.       . : " I need to get through all this legal stuff " (pt-stated)       [name] has talked for past 30 days with client regarding client legal issues.   Clinical Goal(s):  Over the next 30 days, patient will verbalize understanding of upcoming legal proceedings including court date, contact information for attorney, and transport arrangements to  court  Interventions:  CSW discussed his communicating with attorney to discuss legal needs of client CSW discussed transport arrangements with client to and from court hearing        Plan:   Recommendations:  1. Patient would benefit from continued [name] CCM support either in person at provider office or by phone at least weekly.  2. Patient/caregiver will: ask home health aide to contact me to read legal documents so that I can provide direction regarding next court hearing.   [name].Gurtha Picker MSW, [name] Licensed Clinical Social Worker [facility] Care Management [phone]

## 2018-04-19 ENCOUNTER — Encounter: Payer: Medicare Other | Admitting: Physical Medicine & Rehabilitation

## 2018-04-21 ENCOUNTER — Ambulatory Visit: Payer: Self-pay | Admitting: Licensed Clinical Social Worker

## 2018-04-21 DIAGNOSIS — G809 Cerebral palsy, unspecified: Secondary | ICD-10-CM

## 2018-04-21 DIAGNOSIS — F411 Generalized anxiety disorder: Secondary | ICD-10-CM

## 2018-04-21 NOTE — Patient Instructions (Signed)
Licensed Clinical Social Worker Visit Information  Goals we discussed today:  Goals Addressed            This Visit's Progress   . "I want to know about changes to my medicine" (pt-stated)       Clinical Social Work Clinical Goal(s): Over next 7 days, patient will collaborate with nurse case manager regarding questions about medications.   Interventions: 1. Listened to client's concerns over medication changes. 2/  Referred to nurse case manager for assistance with medicatons.  Patient Self Care Activities:  1. Patient takes medications independently from pre-filled pill packs  Please see past updates related to this goal by clicking on the "Past Updates" button in the selected goal        Patient Instructions:  1 Call the social worker for psychosocial support if needed   The patient verbalized understanding of instructions provided today and declined a print copy of patient instruction materials.    [name].Layla Kesling MSW, [name] Licensed Clinical Social Worker [facility] [phone]

## 2018-04-21 NOTE — Chronic Care Management (AMB) (Signed)
  Chronic Care Management    Clinical Social Work Follow Up Note  04/21/2018 Name: SYLVAN SOOKDEO MRN: [id number] DOB: 07/22/1955   Referred by: Dr. Livia Snellen for assessment of psychosocial needs and support.    Review of patient status, including review of consultants reports, relevant laboratory and other test results, and collaboration with appropriate care team members and the patient's provider was performed as part of comprehensive patient evaluation and provision of chronic care management services.    Last CCM Appointment: telephone appointment CCM 03/31/18. PHQ2/9:    Chronic Care Management from 04/11/2018 in [place]  PHQ-9 Total Score  5     Goals/Interventions  Goals Addressed            This Visit's Progress   . "I want to know about changes to my medicine" (pt-stated)       Clinical Social Work Clinical Goal(s): Over next 7 days, patient will collaborate with nurse case manager regarding questions about medications.   Interventions: 1. Listened to client's concerns over medication changes. 2/  Referred to nurse case manager for assistance with medicatons.  Patient Self Care Activities:  1. Patient takes medications independently from pre-filled pill packs  Please see past updates related to this goal by clicking on the "Past Updates" button in the selected goal        Plan:    1. Recommendations:  . Patient would benefit from continued [name] CCM support either in person at provider office or by phone . Referral to [facility]    2. Patient/caregiver will:   . Call Social Worker as needed for psychosocial suppor  Uriah Philipson.Cady Hafen MSW, [name] Licensed Clinical Social Worker [facility] [phone]

## 2018-04-25 ENCOUNTER — Encounter: Payer: Self-pay | Admitting: Cardiology

## 2018-04-25 ENCOUNTER — Other Ambulatory Visit: Payer: Self-pay

## 2018-04-25 NOTE — Progress Notes (Signed)
I have reviewed and agree with the above AWV documentation.  [name], M.D.

## 2018-04-25 NOTE — Patient Outreach (Signed)
[place] [place] [facility]) Care Management  04/25/2018  [name] 06/21/1955 [id number]   Request received from [place], [name], on 04/22/18 to arrange transportation to appointment with Dr. Percival Spanish on 04/27/18 @ 10:00 AM.  BSW attempted to contact [name] scheduler multiple times to arrange this but had to leave voicemail message.  BSW received return call from scheduler today but transportation for 1/8 has been closed.  BSW arranged transportation via [place], [place] Worker [phone]

## 2018-04-25 NOTE — Progress Notes (Signed)
Cardiology Office Note   Date:  04/27/2018   ID:  [name], DOB January 19, 1956, MRN [id number]  PCP:  James Fraise, MD  Cardiologist:   Minus Breeding, MD   Chief Complaint  Patient presents with  . Coronary Artery Disease      History of Present Illness: [name] is a 63 y.o. male who presents for follow up of CAD.  The patient has a history of CAD. He has has had a remote LHC that showed 30% LAD and 70% diagonal lesion. He has reported having stents in the past, although this was felt to be possibly incorrect per prior cardiology evaluation.  He had chest pain and had cath earlier this year.  The patient underwent LHC that showeda 70% LAD stenosis with other nonobstructive CAD and normal LVgram; no culprit lesion was identified.  He was managed medically.    Since we last saw him he has had occasional need for nitroglycerin.  He might take 2 at a time but this is a couple times a week and seems to be a stable pattern.  Is not changed since his catheterization.  He lives by himself in a trailer.  The cannot read or write.  His sister helps to take care of him.  He has home health that comes a couple times per week.  He has cerebral palsy and has some difficulty with partial paralysis of his left arm and leg but he gets around fairly well.  He is not having any new shortness of breath, PND or orthopnea.  He is not any palpitations, presyncope or syncope.  He unfortunately still smokes about a pack of cigarettes per day despite trying therapies to quit.  Past Medical History:  Diagnosis Date  . Cerebral palsy ([place])   . Coronary artery disease    LAD 70% stenosis, cath 2019  . Depression   . GERD (gastroesophageal reflux disease)   . Hypertension   . Scoliosis   . Seizures ([place])    pt's sister states that patinet has had seizures in the past, pt camr for PAT by himself on last visit and she stst "they misunderstood what he was trying to say. Pt saw neurologist in March who  did CT and states patient does not have seizures. On no meds, had seizures as child.    Past Surgical History:  Procedure Laterality Date  . CARPAL TUNNEL RELEASE Right 08/13/2016   Procedure: CARPAL TUNNEL RELEASE;  Surgeon: Carole Civil, MD;  Location: [facility];  Service: Orthopedics;  Laterality: Right;  . CORONARY ANGIOPLASTY WITH STENT PLACEMENT    . LEFT HEART CATH AND CORONARY ANGIOGRAPHY N/A 07/10/2017   Procedure: LEFT HEART CATH AND CORONARY ANGIOGRAPHY;  Surgeon: Troy Sine, MD;  Location: [facility];  Service: Cardiovascular;  Laterality: N/A;     Current Outpatient Medications  Medication Sig Dispense Refill  . acetaminophen (TYLENOL) 325 MG tablet Take 2 tablets (650 mg total) by mouth every 6 (six) hours as needed for mild pain or headache.    . albuterol (PROAIR HFA) 108 (90 Base) MCG/ACT inhaler 1-2 puffs every 6 hours as needed wheezing or shortness of breath. 17 g 10  . atorvastatin (LIPITOR) 40 MG tablet Take 1 tablet (40 mg total) by mouth daily at 6 PM. 90 tablet 1  . buPROPion (WELLBUTRIN SR) 150 MG 12 hr tablet Take 1 tablet (150 mg total) by mouth 2 (two) times daily. 60 tablet 5  .  DULoxetine (CYMBALTA) 30 MG capsule Take 2 capsules (60 mg total) by mouth daily. WITH A FULL STOMACH AT SUPPER TIME 180 capsule 1  . fexofenadine (ALLEGRA) 180 MG tablet Take 1 tablet (180 mg total) by mouth daily. For allergy symptoms 30 tablet 11  . fluticasone (FLONASE) 50 MCG/ACT nasal spray Place 2 sprays into both nostrils daily. 16 g 2  . fluticasone furoate-vilanterol (BREO ELLIPTA) 100-25 MCG/INH AEPB Inhale 1 puff into the lungs daily. 90 each 0  . isosorbide mononitrate (IMDUR) 30 MG 24 hr tablet Take 1 tablet (30 mg total) by mouth daily. 90 tablet 1  . lisinopril (PRINIVIL,ZESTRIL) 20 MG tablet Take 1 tablet (20 mg total) by mouth daily. 90 tablet 1  . metoprolol tartrate (LOPRESSOR) 25 MG tablet Take 1 tablet (25 mg total) by mouth 2 (two) times daily. 180  tablet 1  . nitroGLYCERIN (NITROSTAT) 0.4 MG SL tablet Place 1 tablet (0.4 mg total) under the tongue every 5 (five) minutes x 3 doses as needed for chest pain. 25 tablet 11  . pantoprazole (PROTONIX) 40 MG tablet Take 1 tablet (40 mg total) by mouth 2 (two) times daily. 180 tablet 1  . tamsulosin (FLOMAX) 0.4 MG CAPS capsule TAKE (1) CAPSULE DAILY 90 capsule 1  . aspirin EC 81 MG tablet Take 1 tablet (81 mg total) by mouth daily. 90 tablet 3   No current facility-administered medications for this visit.     Allergies:   Chantix [varenicline tartrate]    ROS:  Please see the history of present illness.   Otherwise, review of systems are positive for none.   All other systems are reviewed and negative.    PHYSICAL EXAM: VS:  BP 120/80   Pulse 61   Ht 6' (1.829 m)   Wt 232 lb (105.2 kg)   BMI 31.46 kg/m  , BMI Body mass index is 31.46 kg/m. GENERAL:  Well appearing NECK:  No jugular venous distention, waveform within normal limits, carotid upstroke brisk and symmetric, no bruits, no thyromegaly LUNGS:  Clear to auscultation bilaterally BACK:  No CVA tenderness CHEST:  Unremarkable HEART:  PMI not displaced or sustained,S1 and S2 within normal limits, no S3, no S4, no clicks, no rubs, no murmurs ABD:  Flat, positive bowel sounds normal in frequency in pitch, no bruits, no rebound, no guarding, no midline pulsatile mass, no hepatomegaly, no splenomegaly EXT:  2 plus pulses throughout, no edema, no cyanosis no clubbing, partial paralysis and muscle wasting left arm    EKG:  EKG is ordered today. The ekg ordered today demonstrates sinus rhythm, rate 61, axis within normal limits, intervals within normal limits, no acute ST-T wave changes.   Recent Labs: 07/11/2017: Magnesium 2.0 11/03/2017: ALT 12; BUN 13; Creatinine, Ser 0.83; Potassium 4.4; Sodium 144 02/15/2018: Hemoglobin 16.3; Platelets 272    Lipid Panel    Component Value Date/Time   CHOL 104 11/03/2017 1342   TRIG 129  11/03/2017 1342   HDL 34 (L) 11/03/2017 1342   CHOLHDL 3.1 11/03/2017 1342   CHOLHDL 3.2 07/11/2017 0000   VLDL 26 07/11/2017 0000   LDLCALC 44 11/03/2017 1342      Wt Readings from Last 3 Encounters:  04/27/18 232 lb (105.2 kg)  03/28/18 224 lb (101.6 kg)  03/08/18 226 lb (102.5 kg)      Other studies Reviewed: Additional studies/ records that were reviewed today include: Labs. Review of the above records demonstrates:  Please see elsewhere in the note.  ASSESSMENT AND PLAN:  CAD:  The patient has no new sypmtoms.  No further cardiovascular testing is indicated.  We will continue with aggressive risk reduction and meds as listed.  HTN:  The blood pressure is at target. No change in medications is indicated. We will continue with therapeutic lifestyle changes (TLC).  DYSLIPIDEMIA:  He is at target as above.  No change in therapy.   TOBACCO ABUSE:  We talked about this.  He tried therapies.   We talked about other strategies.     Current medicines are reviewed at length with the patient today.  The patient does not have concerns regarding medicines.  The following changes have been made:  no change  Labs/ tests ordered today include: None No orders of the defined types were placed in this encounter.    Disposition:   FU with me in 12 months.      Signed, Minus Breeding, MD  04/27/2018 10:37 AM    [facility]

## 2018-04-26 ENCOUNTER — Ambulatory Visit: Payer: Medicare Other | Admitting: Licensed Clinical Social Worker

## 2018-04-26 ENCOUNTER — Ambulatory Visit: Payer: Self-pay | Admitting: *Deleted

## 2018-04-26 DIAGNOSIS — F411 Generalized anxiety disorder: Secondary | ICD-10-CM

## 2018-04-26 DIAGNOSIS — Z550 Illiteracy and low-level literacy: Secondary | ICD-10-CM

## 2018-04-26 DIAGNOSIS — I25118 Atherosclerotic heart disease of native coronary artery with other forms of angina pectoris: Secondary | ICD-10-CM

## 2018-04-26 DIAGNOSIS — I209 Angina pectoris, unspecified: Secondary | ICD-10-CM

## 2018-04-26 DIAGNOSIS — G809 Cerebral palsy, unspecified: Secondary | ICD-10-CM

## 2018-04-26 NOTE — Patient Instructions (Signed)
Licensed Clinical Social Worker Visit Information  Goals we discussed today:  Goals Addressed            This Visit's Progress   . " I am having pain in my chest more often" (pt-stated)       Clinical Social Work Clinical Goal(s): Over next 72 hours, patient will collaborate with nurse case manager regarding management of chest pain  Interventions: . Asked RN Case Manager to speak with pt on the phone immediately at patient's report of chest pain . Referred to nurse case manager for nursing CCM services  Patient Self Care Activities:  . Patient self administers medications including Ntg . Monitors chest pain and reports       . " I want to make sure I get ride to heart doctor" (pt-stated)       Clinical Social Work Clinical Goal(s): Over the next 30 days, client will verbalize understanding of transport resources.   Interventions: . Collaborated with [facility] BSW to ensure availability of transport resources for client cardiology appointment on 04/27/18. [name] Discussed transport agencies in area for ongoing/long term transport needs.   Patient Self Care Activities: ex.Arranges transportation to appointments (independent), utilizes self calming strategies (in progress) . Patient knows to contact transport Education officer, museum when assistance needed for transport services. . Patient contacts [name] to discuss upcoming appointments.        . COMPLETED: "I want to know about changes to my medicine" (pt-stated)       Clinical Social Work Clinical Goal(s): Over next 7 days, patient will collaborate with nurse case manager regarding questions about medications.   Interventions: 1. Listened to client's concerns over medication changes. 2/  Referred to nurse case manager for assistance with medicatons.  Patient Self Care Activities:  1. Patient takes medications independently from pre-filled pill packs  Please see past updates related to this goal by clicking on the "Past Updates" button in the  selected goal        Patient Instructions:   Call [name] if needed for psychosocial support  Call RN CM [name] [facility]) as needed for nursing support.  Continue to monitor chest pain and follow nurse's instructions.  The patient verbalized understanding of instructions provided today and declined a print copy of patient instruction materials.   [name].[name] MSW, [name] Licensed Clinical Social Worker [facility] [phone]

## 2018-04-26 NOTE — Chronic Care Management (AMB) (Signed)
  Chronic Care Management    Clinical Social Work Follow Up Note  04/26/2018 Name: [name] MRN: [id number] DOB: 01-09-1956   Referred by: Dr. Livia Snellen for assessment of psychosocial needs and support.   Review of patient status, including review of consultants reports, relevant test results, and collaboration with appropriate care team members and the patient's provider was performed as part of comprehensive patient evaluation and provision of chronic care management services.    Last CCM Appointment: 04/11/18 PHQ2/9:    Chronic Care Management from 04/11/2018 in [place]  PHQ-9 Total Score  5     Goals Addressed    . " I am having pain in my chest more often" (pt-stated)       Clinical Social Work Clinical Goal(s): Over next 72 hours, patient will collaborate with nurse case manager regarding management of chest pain  Interventions: . Asked RN Case Manager to speak with pt on the phone immediately at patient's report of chest pain . Referred to nurse case manager for nursing CCM services  Patient Self Care Activities:  . Patient self administers medications including Ntg . Monitors chest pain and reports    . " I want to make sure I get ride to heart doctor" (pt-stated)       Clinical Social Work Clinical Goal(s): Over the next 30 days, client will verbalize understanding of transport resources.   Interventions: . Collaborated with [facility] BSW to ensure availability of transport resources for client cardiology appointment on 04/27/18. [name] Discussed transport agencies in area for ongoing/long term transport needs.   Patient Self Care Activities: ex.Arranges transportation to appointments (independent), utilizes self calming strategies (in progress) . Patient knows to contact transport Education officer, museum when assistance needed for transport services. . Patient contacts [name] to discuss upcoming appointments.     . COMPLETED: "I want to know about changes to my  medicine" (pt-stated)       Clinical Social Work Clinical Goal(s): Over next 7 days, patient will collaborate with nurse case manager regarding questions about medications.   Interventions: 1. Listened to client's concerns over medication changes. 2/  Referred to nurse case manager for assistance with medicatons.  Patient Self Care Activities:  1. Patient takes medications independently from pre-filled pill packs  Please see past updates related to this goal by clicking on the "Past Updates" button in the selected goal      Plan:    1. Recommendations:  . Patient would benefit from continued [name] CCM support either in person at provider office or by phone . Patient would benefit from continued RN CM support either in person or at provider office.     2. Patient/caregiver will:   . Call Social Worker as needed for psychosocial support . Call RN CM as needed related to nursing support.  . Patient will attend scheduled cardiology appointment on 04/27/18 with Dr. Percival Spanish.  [name].Arafat Cocuzza MSW, [name] Licensed Clinical Social Worker [facility] [phone]

## 2018-04-26 NOTE — Patient Instructions (Signed)
Visit Information  Goals we discussed today:  Goals Addressed    . "I need to figure out what is going on with my heart" (pt-stated)       Nurse Case Manager Clinical Goal(s): Over the next 30 days, patient will verbalize understanding of plan of care for management of angina.   Interventions:  . Telephone conversation with patient to include advice re: management of acute angina . Enrollment in CCM follow up with nurse case manager  . Notification of Cardiologist and PCP re: report of angina today  Patient Self Care Activities:  . Patient monitors chest pain and reports . Patient self administers medications      Education or Materials Provided:   Education re: prescribed use of nitroglycerin sublingual tablet for management of angina  Instructions:  Take you ntg as prescribed for chest pain Call 911 if your chest pain doesn't go away after the 2nd dose of Ntg Go to your appointment with Dr. Percival Spanish tomorrow  The patient verbalized understanding of instructions provided today and declined a print copy of patient instruction materials.   [place]  [phone]

## 2018-04-26 NOTE — Progress Notes (Signed)
Scheduled for telephone f/u on 05/02/17. Has cardiology appt tomorrow.

## 2018-04-26 NOTE — Chronic Care Management (AMB) (Signed)
  Chronic Care Management   Initial Visit Note  04/26/2018 Name: [name] MRN: [id number] DOB: [birth year]  Referred by: [name], MD Reason for referral : Chronic Care Management (CCM follow up/Angina Management)  Subjective: "I don't want to take this medicine because I don't want to get addicted to it but my chest is hurting"   Objective:  BP Readings from Last 3 Encounters:  03/28/18 123/70  03/08/18 112/60  02/22/18 123/78   Number of ED visits/Hospital Admissions in previous 12 months: 07/10/17 AMI  Assessment: [name] is a 63 year old male primary care patient of Dr. [name] who has a medical history which includes CAD s/p AMI 07/10/17, Dyslipidemia, HTN, GAD, Sz d/o, and history of infantile cerebral palsy. Mr. Lemay was referred to CCM by his PCP for assistance with chronic disease management, care coordination, and psychosocial support,  Review of patient status, including review of consultants reports, relevant laboratory and other test results, and collaboration with appropriate care team members and the patient's provider was performed as part of comprehensive patient evaluation and provision of chronic care management services.    Goals Addressed    . "I need to figure out what is going on with my heart" (pt-stated)       While speaking with the [name] by phone today, patient reported 10/10 chest pain and said he was unsure about whether to take his Ntg.   [name] asked me to speak with the patient by phone. Assessment of chest pain and symptoms performed. Advised to take Ntg SL x 1 while I waited with him on the phone. Patient reported 10/10 chest pain prior to Ntg and 5 minutes later reported 0/10 chest pain "completely gone".   I provided verbal education to patient re: prescribed use of Ntg should he experience anginal symptoms again.   [name] has arranged with BSW from [facility] for transportation to cardiology appointment tomorrow at 10am.   Nurse Case  Manager Clinical Goal(s): Over the next 30 days, patient will verbalize understanding of plan of care for management of angina.   Interventions:  . Telephone conversation with patient to include advice re: management of acute angina . Enrollment in CCM follow up with nurse case manager  . Notification of Cardiologist and PCP re: report of angina today  Patient Self Care Activities:  . Patient monitors chest pain and reports . Patient self administers medications     Plan: CCM Nurse Case Manager will follow patient closely for ongoing chronic disease management and care coordination.  RNCM and [name] will continue to provide collaborative CCM care to patient.  RNCM will reach out to patient by phone over the next 7 days.   [place]  [phone]

## 2018-04-27 ENCOUNTER — Ambulatory Visit (INDEPENDENT_AMBULATORY_CARE_PROVIDER_SITE_OTHER): Payer: Medicare Other | Admitting: Cardiology

## 2018-04-27 ENCOUNTER — Encounter: Payer: Self-pay | Admitting: Cardiology

## 2018-04-27 VITALS — BP 120/80 | HR 61 | Ht 72.0 in | Wt 232.0 lb

## 2018-04-27 DIAGNOSIS — Z720 Tobacco use: Secondary | ICD-10-CM | POA: Diagnosis not present

## 2018-04-27 DIAGNOSIS — I2510 Atherosclerotic heart disease of native coronary artery without angina pectoris: Secondary | ICD-10-CM | POA: Diagnosis not present

## 2018-04-27 DIAGNOSIS — E785 Hyperlipidemia, unspecified: Secondary | ICD-10-CM

## 2018-04-27 DIAGNOSIS — I252 Old myocardial infarction: Secondary | ICD-10-CM | POA: Diagnosis not present

## 2018-04-27 DIAGNOSIS — I10 Essential (primary) hypertension: Secondary | ICD-10-CM | POA: Diagnosis not present

## 2018-04-27 MED ORDER — ASPIRIN EC 81 MG PO TBEC: 81.0000 mg | DELAYED_RELEASE_TABLET | Freq: Every day | ORAL | 3 refills | Status: DC

## 2018-04-27 NOTE — Patient Instructions (Signed)
Medication Instructions:  Please take Aspirin 81 mg daily.  Continue all other medications as listed.  If you need a refill on your cardiac medications before your next appointment, please call your pharmacy.   Follow-Up: Follow up in 1 year with Dr. Percival Spanish in [place].  You will receive a letter in the mail 2 months before you are due.  Please call us when you receive this letter to schedule your follow up appointment.  Thank you for choosing [facility]!!

## 2018-04-29 NOTE — Addendum Note (Signed)
Addended by: [name] on: 04/29/2018 11:58 AM   Modules accepted: Orders

## 2018-05-02 ENCOUNTER — Other Ambulatory Visit: Payer: Self-pay | Admitting: *Deleted

## 2018-05-02 ENCOUNTER — Ambulatory Visit (INDEPENDENT_AMBULATORY_CARE_PROVIDER_SITE_OTHER): Payer: Medicare Other | Admitting: *Deleted

## 2018-05-02 DIAGNOSIS — I252 Old myocardial infarction: Secondary | ICD-10-CM | POA: Diagnosis not present

## 2018-05-02 DIAGNOSIS — F17200 Nicotine dependence, unspecified, uncomplicated: Secondary | ICD-10-CM

## 2018-05-02 DIAGNOSIS — E785 Hyperlipidemia, unspecified: Secondary | ICD-10-CM | POA: Diagnosis not present

## 2018-05-02 DIAGNOSIS — F411 Generalized anxiety disorder: Secondary | ICD-10-CM

## 2018-05-02 DIAGNOSIS — I10 Essential (primary) hypertension: Secondary | ICD-10-CM | POA: Diagnosis not present

## 2018-05-02 DIAGNOSIS — G809 Cerebral palsy, unspecified: Secondary | ICD-10-CM

## 2018-05-02 MED ORDER — ASPIRIN EC 81 MG PO TBEC: 81.0000 mg | DELAYED_RELEASE_TABLET | Freq: Every day | ORAL | 3 refills | Status: DC

## 2018-05-02 NOTE — Patient Instructions (Addendum)
Visit Information  Goals Addressed    . "I need to figure out what is going on with my heart" (pt-stated)       Nurse Case Manager Clinical Goal(s): Over the next 30 days, patient will verbalize understanding of plan of care for management of angina.   Interventions:  . Discussed frequency of angina. . Asked patient what he does when he has chest pains . Reviewed provider's plan of care for angina with patient . Reviewed cardiology office note and results from 04/27/2018 . CCM follow up Telephone call within the next two weeks.  . Schedule a face to face visit in 6 to 8 weeks since he is being closely followed by his physicians. He saw his cardiologist last week and his PCP visit is this week.    Patient Self Care Activities:  . Patient monitors chest pain and reports . Patient self administers medications      . "I thought Dr Percival Spanish was going to prescribe me something new at my appointment but it wasn't put in my pack." (pt-stated)       Current Barriers:  . Illiteracy . Does not know what each medication is or what it is used for  Nurse Case Manager Clinical Goal(s): Over the next 2 weeks patient will be able to recognize that Aspirin 81mg  has been added to his pill pack.   Interventions:  . Explained that Dr Percival Spanish did prescirbe Aspirin 81 mg but that it is an OTC medication.  . To help Mr Cain take his medications as prescribed, I collaborated with [employer] and reordered Aspirin 81 mg with the instructions to include it daily in his pill pack.    Patient Self Care Activities:  . Self administers medications . Aware of how many medications he takes daily and what they look like   Please see past updates related to this goal by clicking on the "Past Updates" button in the selected goal      . "I want to make sure that I know what all of the pills I am taking are for. There are two new pills that I don't know what they're for." (pt-stated)       Current Barriers:   . Cognitive deficits . illiteracy  Nurse Case Manager Clinical Goal(s): Over the next 2 months patient will be able to identify each of his medications by site and know what they are used for.  Interventions:  . Reviewed current medication list and their prescribed dates . Crossreferenced the prescription dates with the office notes and provided an explanation to the patient . Advised that fexofenadine (Allegra) was prescribed in response to his request for something for his runny nose . Advised that Celebrex was prescribed in response to his complaints of joint pain. [name] with [facility] and had them remove the Celebrex from the pill package and provide it in a sepearate bottle so that Mr Schum can take it daily as needed at his discretion. . Will collaborate with pharmacy to provide patient with a visual resource to tell what each pill does.  Patient Self Care Activities:  . Self administers medications . Aware of the number of pills that he takes and what color they are . Will question someone if he thinks something is wrong  Please see past updates related to this goal by clicking on the "Past Updates" button in the selected goal      . "I've had a heart attack before and I don't  want to have another one." (pt-stated)       Current Barriers:  . Cognitive deficits . Illiteracy . Dependence on others for transportation  Nurse Case Manager Clinical Goal(s): Over the next week patient will verbalize understanding of his provider's plan of care for managing his heart disease.   Interventions:   Reviewed [facility] cardiology office note from 04/27/2018  Reviewed medications with patient  Reviewed previous cardiology tests/studies  Collaborated with [place] to have them include the Aspirin 81mg  prescribed by Dr Percival Spanish, included in the pill packages that they make for him  Reiterated the effects of smoking on the heart and cardiovascular system and  discussed smoking cessation strategies and encouraged him to quit  Reminded him to follow up with Dr Percival Spanish in 1 year  Patient Self Care Activities:  . Self administers medications . Has the ability to exercise . He does some physical labor but not consistently . Aware of s/s of heart attack  Please see past updates related to this goal by clicking on the "Past Updates" button in the selected goal        Education or Materials Provided:  1. We talked about how it's better for your health to stop smoking and that you can use a sucker when you have an urge 2. Call 911 if you ever need to take 3 nitro pills. Call our office or your cardiologist if you need to take 2.  3. We talked about the symptoms of a heart attack and what to do if you have those.  4. I had [place] add an 81mg  aspirin to your daily pill pack  5. I had [place] remove the Celebrex and put it in a separate bottle for you to take as needed.  6. I explained that fexofenadine is for allergies and may help with your runny nose   Plan I will call you to follow up over the next two weeks.  The patient verbalized understanding of instructions provided today and declined a print copy of patient instruction materials.   [name], RN-BC, BSN Nurse Case Manager [facility] Ph: [phone]

## 2018-05-02 NOTE — Chronic Care Management (AMB) (Signed)
Chronic Care Management   Follow Up Note   05/02/2018 Name: [name] MRN: [id number] DOB: Feb 12, 1956  Referred by: [name], MD Reason for referral : Chronic Care Management (Initial CCM telephone Contact)  Subjective: Patient concerned about arranging transportation to PCP visit on 05/04/2018. He also discussed visit with Dr Percival Spanish last week and said that Dr Percival Spanish was going to prescribe a new medicine but that he didn't see it in his pill pack. He also stated concern over two medicines in his pill pack that he isn't familiar with. Once again mentioned his home health nurses "wanting to buy his speakers". These suspicions have been discussed with our [name] and home health management.     Objective:  Current Outpatient Medications on File Prior to Visit  Medication Sig Dispense Refill  . acetaminophen (TYLENOL) 325 MG tablet Take 2 tablets (650 mg total) by mouth every 6 (six) hours as needed for mild pain or headache.    . albuterol (PROAIR HFA) 108 (90 Base) MCG/ACT inhaler 1-2 puffs every 6 hours as needed wheezing or shortness of breath. 17 g 10  . atorvastatin (LIPITOR) 40 MG tablet Take 1 tablet (40 mg total) by mouth daily at 6 PM. 90 tablet 1  . buPROPion (WELLBUTRIN SR) 150 MG 12 hr tablet Take 1 tablet (150 mg total) by mouth 2 (two) times daily. 60 tablet 5  . celecoxib (CELEBREX) 200 MG capsule Take 200 mg by mouth daily.    . DULoxetine (CYMBALTA) 30 MG capsule Take 2 capsules (60 mg total) by mouth daily. WITH A FULL STOMACH AT SUPPER TIME 180 capsule 1  . fexofenadine (ALLEGRA) 180 MG tablet Take 1 tablet (180 mg total) by mouth daily. For allergy symptoms 30 tablet 11  . fluticasone (FLONASE) 50 MCG/ACT nasal spray Place 2 sprays into both nostrils daily. 16 g 2  . fluticasone furoate-vilanterol (BREO ELLIPTA) 100-25 MCG/INH AEPB Inhale 1 puff into the lungs daily. 90 each 0  . isosorbide mononitrate (IMDUR) 30 MG 24 hr tablet Take 1 tablet (30 mg total) by  mouth daily. 90 tablet 1  . lisinopril (PRINIVIL,ZESTRIL) 20 MG tablet Take 1 tablet (20 mg total) by mouth daily. 90 tablet 1  . metoprolol tartrate (LOPRESSOR) 25 MG tablet Take 1 tablet (25 mg total) by mouth 2 (two) times daily. 180 tablet 1  . nitroGLYCERIN (NITROSTAT) 0.4 MG SL tablet Place 1 tablet (0.4 mg total) under the tongue every 5 (five) minutes x 3 doses as needed for chest pain. 25 tablet 11  . pantoprazole (PROTONIX) 40 MG tablet Take 1 tablet (40 mg total) by mouth 2 (two) times daily. 180 tablet 1  . tamsulosin (FLOMAX) 0.4 MG CAPS capsule TAKE (1) CAPSULE DAILY 90 capsule 1   No current facility-administered medications on file prior to visit.     Assessment: [name] is a 63 year old male primary care patient of Dr Livia Snellen who has infantile cerebral palsy, HTN, hx of MI, dyslipidemia, and GAD. He was referred for CCM services to help manage these conditions. Today's call was primarily focused on medication management and following up on his visit with his cardiologist.   Goals Addressed    . "I need to figure out what is going on with my heart" (pt-stated)       Nurse Case Manager Clinical Goal(s): Over the next 30 days, patient will verbalize understanding of plan of care for management of angina.   Interventions:  . Discussed frequency of angina. [name]  Asked patient what he does when he has chest pains . Reviewed provider's plan of care for angina with patient . Reviewed cardiology office note and results from 04/27/2018 . CCM follow up Telephone call within the next two weeks.  . Schedule a face to face visit in 6 to 8 weeks since he is being closely followed by his physicians. He saw his cardiologist last week and his PCP visit is this week.    Patient Self Care Activities:  . Patient monitors chest pain and reports . Patient self administers medications     . "I thought Dr Percival Spanish was going to prescribe me something new at my appointment but it wasn't put in my pack."  (pt-stated)       Current Barriers:  . Illiteracy . Does not know what each medication is or what it is used for  Nurse Case Manager Clinical Goal(s): Over the next 2 weeks patient will be able to recognize that Aspirin 81mg  has been added to his pill pack.   Interventions:  . Explained that Dr Percival Spanish did prescirbe Aspirin 81 mg but that it is an OTC medication.  . To help [name] take his medications as prescribed, I collaborated with [employer] and reordered Aspirin 81 mg with the instructions to include it daily in his pill pack.    Patient Self Care Activities:  . Self administers medications . Aware of how many medications he takes daily and what they look like   Please see past updates related to this goal by clicking on the "Past Updates" button in the selected goal     . "I want to make sure that I know what all of the pills I am taking are for. There are two new pills that I don't know what they're for." (pt-stated)       Current Barriers:  . Cognitive deficits . illiteracy  Nurse Case Manager Clinical Goal(s): Over the next 2 months patient will be able to identify each of his medications by site and know what they are used for.  Interventions:  . Reviewed current medication list and their prescribed dates . Crossreferenced the prescription dates with the office notes and provided an explanation to the patient . Advised that fexofenadine (Allegra) was prescribed in response to his request for something for his runny nose . Advised that Celebrex was prescribed in response to his complaints of joint pain. [name] with [facility] and had them remove the Celebrex from the pill package and provide it in a sepearate bottle so that [name] can take it daily as needed at his discretion. . Will collaborate with pharmacy to provide patient with a visual resource to tell what each pill does  Patient Self Care Activities:  . Self administers medications . Aware  of the number of pills that he takes and what color they are . Will question someone if he thinks something is wrong   Please see past updates related to this goal by clicking on the "Past Updates" button in the selected goal     . "I've had a heart attack before and I don't want to have another one." (pt-stated)       Current Barriers:  . Cognitive deficits . Illiteracy . Dependence on others for transportation  Nurse Case Manager Clinical Goal(s): Over the next week patient will verbalize understanding of his provider's plan of care for managing his heart disease.   Interventions:   Reviewed [facility] cardiology office  note from 04/27/2018  Reviewed medications with patient  Reviewed previous cardiology tests/studies  Collaborated with [place] to have them include the Aspirin 81mg  prescribed by Dr Percival Spanish, included in the pill packages that they make for him  Reiterated the effects of smoking on the heart and cardiovascular system and discussed smoking cessation strategies and encouraged him to quit  Reminded him to follow up with Dr Percival Spanish in 1 year  Patient Self Care Activities:  . Self administers medications . Has the ability to exercise . He does some physical labor but not consistently . Aware of s/s of heart attack  Please see past updates related to this goal by clicking on the "Past Updates" button in the selected goal        Plan: Continue to follow for CCM  Scheduled telephone follow up within the next 2 weeks     [name], RN-BC, BSN Nurse Case Manager [place] Ph: [phone]

## 2018-05-03 ENCOUNTER — Other Ambulatory Visit: Payer: Self-pay

## 2018-05-03 NOTE — Patient Outreach (Signed)
[facility]) Care Management  05/03/2018  [name] Anes 1955-05-23 [id number]   Request received from [place], [name], to arrange transportation to appointment with Dr. [name] on 05/04/18 @ 9:55 AM.  Transportation arranged via [place], [place] Worker [phone]

## 2018-05-04 ENCOUNTER — Encounter: Payer: Self-pay | Admitting: Family Medicine

## 2018-05-04 ENCOUNTER — Ambulatory Visit (INDEPENDENT_AMBULATORY_CARE_PROVIDER_SITE_OTHER): Payer: Medicare Other | Admitting: Family Medicine

## 2018-05-04 ENCOUNTER — Ambulatory Visit: Payer: Self-pay | Admitting: Licensed Clinical Social Worker

## 2018-05-04 VITALS — BP 131/75 | HR 64 | Temp 97.4°F | Ht 72.0 in | Wt 232.2 lb

## 2018-05-04 DIAGNOSIS — J440 Chronic obstructive pulmonary disease with acute lower respiratory infection: Secondary | ICD-10-CM

## 2018-05-04 DIAGNOSIS — I10 Essential (primary) hypertension: Secondary | ICD-10-CM | POA: Diagnosis not present

## 2018-05-04 DIAGNOSIS — E785 Hyperlipidemia, unspecified: Secondary | ICD-10-CM | POA: Diagnosis not present

## 2018-05-04 DIAGNOSIS — Z550 Illiteracy and low-level literacy: Secondary | ICD-10-CM

## 2018-05-04 DIAGNOSIS — J209 Acute bronchitis, unspecified: Secondary | ICD-10-CM | POA: Diagnosis not present

## 2018-05-04 DIAGNOSIS — F411 Generalized anxiety disorder: Secondary | ICD-10-CM

## 2018-05-04 DIAGNOSIS — R482 Apraxia: Secondary | ICD-10-CM

## 2018-05-04 DIAGNOSIS — G809 Cerebral palsy, unspecified: Secondary | ICD-10-CM

## 2018-05-04 LAB — CBC WITH DIFFERENTIAL/PLATELET
Basophils Absolute: 0.1 10*3/uL (ref 0.0–0.2)
Basos: 1 %
EOS (ABSOLUTE): 1.2 10*3/uL — ABNORMAL HIGH (ref 0.0–0.4)
Eos: 12 %
HEMOGLOBIN: 14.8 g/dL (ref 13.0–17.7)
Hematocrit: 41.2 % (ref 37.5–51.0)
IMMATURE GRANS (ABS): 0 10*3/uL (ref 0.0–0.1)
Immature Granulocytes: 0 %
Lymphocytes Absolute: 2.9 10*3/uL (ref 0.7–3.1)
Lymphs: 29 %
MCH: 34.7 pg — AB (ref 26.6–33.0)
MCHC: 35.9 g/dL — ABNORMAL HIGH (ref 31.5–35.7)
MCV: 97 fL (ref 79–97)
Monocytes Absolute: 0.4 10*3/uL (ref 0.1–0.9)
Monocytes: 4 %
Neutrophils Absolute: 5.5 10*3/uL (ref 1.4–7.0)
Neutrophils: 54 %
Platelets: 211 10*3/uL (ref 150–450)
RBC: 4.27 x10E6/uL (ref 4.14–5.80)
RDW: 12.3 % (ref 11.6–15.4)
WBC: 10.1 10*3/uL (ref 3.4–10.8)

## 2018-05-04 LAB — CMP14+EGFR
ALBUMIN: 3.9 g/dL (ref 3.6–4.8)
ALT: 10 IU/L (ref 0–44)
AST: 14 IU/L (ref 0–40)
Albumin/Globulin Ratio: 1.3 (ref 1.2–2.2)
Alkaline Phosphatase: 77 IU/L (ref 39–117)
BUN/Creatinine Ratio: 10 (ref 10–24)
BUN: 8 mg/dL (ref 8–27)
Bilirubin Total: 0.7 mg/dL (ref 0.0–1.2)
CO2: 29 mmol/L (ref 20–29)
Calcium: 9.1 mg/dL (ref 8.6–10.2)
Chloride: 99 mmol/L (ref 96–106)
Creatinine, Ser: 0.8 mg/dL (ref 0.76–1.27)
GFR calc Af Amer: 112 mL/min/{1.73_m2} (ref >59)
GFR calc non Af Amer: 97 mL/min/{1.73_m2} (ref >59)
GLUCOSE: 85 mg/dL (ref 65–99)
Globulin, Total: 3 g/dL (ref 1.5–4.5)
Potassium: 3.8 mmol/L (ref 3.5–5.2)
Sodium: 145 mmol/L — ABNORMAL HIGH (ref 134–144)
Total Protein: 6.9 g/dL (ref 6.0–8.5)

## 2018-05-04 LAB — LIPID PANEL
CHOLESTEROL TOTAL: 93 mg/dL — AB (ref 100–199)
Chol/HDL Ratio: 2.9 ratio (ref 0.0–5.0)
HDL: 32 mg/dL — ABNORMAL LOW (ref >39)
LDL Calculated: 42 mg/dL (ref 0–99)
Triglycerides: 93 mg/dL (ref 0–149)
VLDL Cholesterol Cal: 19 mg/dL (ref 5–40)

## 2018-05-04 MED ORDER — FLUTICASONE FUROATE-VILANTEROL 100-25 MCG/INH IN AEPB: INHALATION_SPRAY | RESPIRATORY_TRACT | 0 refills | Status: DC

## 2018-05-04 MED ORDER — ASPIRIN 325 MG PO TBEC: 325.0000 mg | DELAYED_RELEASE_TABLET | Freq: Every day | ORAL | 6 refills | Status: DC

## 2018-05-04 NOTE — Progress Notes (Signed)
Subjective:  Patient ID: [name], male    DOB: [birth year]  Age: 63 y.o. MRN: [id number]  CC: Medical Management of Chronic Issues   HPI [name] presents for follow-up of hypertension. Patient has no history of headache chest pain or shortness of breath or recent cough. Patient also denies symptoms of TIA such as numbness weakness lateralizing. Patient checks  blood pressure at home and has not had any elevated readings recently. Patient denies side effects from his medication. States taking it regularly. Patient is also been having some cough and congestion.  Some productivity.  This has been purulent on occasion.  Onset 3 to 4 days ago.  Increasing in severity and frequency.  Due to his cerebral palsy he has some trouble with activities of daily living.  He does have an aide coming in to help some.  However their hours were cut recently.  He has been enrolled in chronic care management program and has contact with our representatives.  They will be checking in on him periodically as well.  Depression screen [facility] 2/9 04/11/2018 03/28/2018 02/22/2018  Decreased Interest 1 0 0  Down, Depressed, Hopeless 1 0 0  PHQ - 2 Score 2 0 0  Altered sleeping 0 - -  Tired, decreased energy 1 - -  Change in appetite 0 - -  Feeling bad or failure about yourself  1 - -  Trouble concentrating 1 - -  Moving slowly or fidgety/restless 0 - -  Suicidal thoughts 0 - -  PHQ-9 Score 5 - -  Difficult doing work/chores - - -  Some recent data might be hidden    History [name] has a past medical history of Cerebral palsy ([place]), Coronary artery disease, Depression, GERD (gastroesophageal reflux disease), Hypertension, Scoliosis, and Seizures ([place]).   He has a past surgical history that includes Coronary angioplasty with stent; Carpal tunnel release (Right, 08/13/2016); and LEFT HEART CATH AND CORONARY ANGIOGRAPHY (N/A, 07/10/2017).   His family history includes Alcohol abuse in his brother; CAD in  his brother and sister; CAD (age of onset: 46) in his father; Diabetes in his mother; Heart disease in his father; Heart failure in his sister.He reports that he has been smoking cigarettes. He has a 41.00 pack-year smoking history. He has never used smokeless tobacco. He reports current alcohol use. He reports that he does not use drugs.    ROS Review of Systems  Constitutional: Negative.   HENT: Positive for congestion and rhinorrhea.   Eyes: Negative for visual disturbance.  Respiratory: Positive for cough. Negative for shortness of breath.   Cardiovascular: Negative for chest pain and leg swelling.  Gastrointestinal: Negative for abdominal pain, diarrhea, nausea and vomiting.  Genitourinary: Negative for difficulty urinating.  Musculoskeletal: Negative for arthralgias and myalgias.  Skin: Negative for rash.  Neurological: Negative for headaches.  Psychiatric/Behavioral: Negative for sleep disturbance.    Objective:  BP 131/75   Pulse 64   Temp (!) 97.4 F (36.3 C) (Oral)   Ht 6' (1.829 m)   Wt 232 lb 4 oz (105.3 kg)   BMI 31.50 kg/m   BP Readings from Last 3 Encounters:  05/04/18 131/75  04/27/18 120/80  03/28/18 123/70    Wt Readings from Last 3 Encounters:  05/04/18 232 lb 4 oz (105.3 kg)  04/27/18 232 lb (105.2 kg)  03/28/18 224 lb (101.6 kg)     Physical Exam Constitutional:      General: He is not in acute distress.  Appearance: He is well-developed.  HENT:     Head: Normocephalic and atraumatic.     Right Ear: External ear normal.     Left Ear: External ear normal.     Nose: Nose normal.  Eyes:     Conjunctiva/sclera: Conjunctivae normal.     Pupils: Pupils are equal, round, and reactive to light.  Neck:     Musculoskeletal: Normal range of motion and neck supple.  Cardiovascular:     Rate and Rhythm: Normal rate and regular rhythm.     Heart sounds: Normal heart sounds. No murmur.  Pulmonary:     Effort: Pulmonary effort is normal. No  respiratory distress.     Breath sounds: Wheezing (scattered) present. No rales.  Abdominal:     Palpations: Abdomen is soft.     Tenderness: There is no abdominal tenderness.  Musculoskeletal: Normal range of motion.  Skin:    General: Skin is warm and dry.  Neurological:     Mental Status: He is alert and oriented to person, place, and time.     Deep Tendon Reflexes: Reflexes are normal and symmetric.  Psychiatric:        Behavior: Behavior normal.        Thought Content: Thought content normal.        Judgment: Judgment normal.       Assessment & Plan:   [place] was seen today for medical management of chronic issues.  Diagnoses and all orders for this visit:  Essential hypertension -     CBC with Differential/Platelet -     CMP14+EGFR  Acute bronchitis with COPD ([place]) -     fluticasone furoate-vilanterol (BREO ELLIPTA) 100-25 MCG/INH AEPB; Inhale 1 puff into the lungs daily.  Dyslipidemia, goal LDL below 70 -     CBC with Differential/Platelet -     CMP14+EGFR -     Lipid panel       I am having Christian Mate. [place] maintain his acetaminophen, albuterol, fluticasone, nitroGLYCERIN, tamsulosin, metoprolol tartrate, atorvastatin, lisinopril, pantoprazole, DULoxetine, isosorbide mononitrate, buPROPion, fexofenadine, aspirin EC, celecoxib, and fluticasone furoate-vilanterol.  Allergies as of 05/04/2018      Reactions   Chantix [varenicline Tartrate] Nausea And Vomiting      Medication List       Accurate as of May 04, 2018 11:01 AM. Always use your most recent med list.        acetaminophen 325 MG tablet Commonly known as:  TYLENOL Take 2 tablets (650 mg total) by mouth every 6 (six) hours as needed for mild pain or headache.   albuterol 108 (90 Base) MCG/ACT inhaler Commonly known as:  PROAIR HFA 1-2 puffs every 6 hours as needed wheezing or shortness of breath.   aspirin EC 81 MG tablet Take 1 tablet (81 mg total) by mouth daily.   atorvastatin 40  MG tablet Commonly known as:  LIPITOR Take 1 tablet (40 mg total) by mouth daily at 6 PM.   buPROPion 150 MG 12 hr tablet Commonly known as:  WELLBUTRIN SR Take 1 tablet (150 mg total) by mouth 2 (two) times daily.   celecoxib 200 MG capsule Commonly known as:  CELEBREX Take 200 mg by mouth daily.   DULoxetine 30 MG capsule Commonly known as:  CYMBALTA Take 2 capsules (60 mg total) by mouth daily. WITH A FULL STOMACH AT SUPPER TIME   fexofenadine 180 MG tablet Commonly known as:  ALLEGRA Take 1 tablet (180 mg total) by mouth daily. For allergy  symptoms   fluticasone 50 MCG/ACT nasal spray Commonly known as:  FLONASE Place 2 sprays into both nostrils daily.   fluticasone furoate-vilanterol 100-25 MCG/INH Aepb Commonly known as:  BREO ELLIPTA Inhale 1 puff into the lungs daily.   isosorbide mononitrate 30 MG 24 hr tablet Commonly known as:  IMDUR Take 1 tablet (30 mg total) by mouth daily.   lisinopril 20 MG tablet Commonly known as:  PRINIVIL,ZESTRIL Take 1 tablet (20 mg total) by mouth daily.   metoprolol tartrate 25 MG tablet Commonly known as:  LOPRESSOR Take 1 tablet (25 mg total) by mouth 2 (two) times daily.   nitroGLYCERIN 0.4 MG SL tablet Commonly known as:  NITROSTAT Place 1 tablet (0.4 mg total) under the tongue every 5 (five) minutes x 3 doses as needed for chest pain.   pantoprazole 40 MG tablet Commonly known as:  PROTONIX Take 1 tablet (40 mg total) by mouth 2 (two) times daily.   tamsulosin 0.4 MG Caps capsule Commonly known as:  FLOMAX TAKE (1) CAPSULE DAILY        Follow-up: No follow-ups on file.  [name], M.D.

## 2018-05-04 NOTE — Patient Instructions (Addendum)
Patient to ask home health aide who assists him as scheduled in the home weekly to read for him EMMI materials provided to patient regarding:  "Fatigue"; "Relaxation Techniques;" "Heart Failure: About Alcohol, Smoking;" and "Chronic Pain". Client agreed to this plan on 05/04/2018.  Current Barriers:   Transport needs   Needs help in scheduling transport assistance/support  Goal: (patient stated): "I need to make sure I get a ride to heart doctor"  Clinical Social Work Clinical Goal(s):  Over next 30 days client will verbalize understanding of transport resources for client in the area.   Interventions:  [name] collaborated with [place] to arrange transport assist for client to cardiology appointment recently with Dr. Percival Spanish and to PCP appointment for client with Dr. Livia Snellen on 05/04/2018.  [name] encouraged client to inform [name] when client has new upcoming medical appointments scheduled.  [name] encouraged client to use [facility] for transport help when possible  Patient Self Care Activities:   Patient contacts [name] to inform [name] of upcoming client appointments  Patient contacts [place], when possible, to seek transport help  Plan:   [name] to call client within next 2 weeks to assess client transport needs.  Client to call [name] with information on upcoming client medical appointments  [name] to collaborate with RNCM [name] regarding nursing needs of client  The patient verbalized understanding of instructions provided today and declined a print copy of patient instruction materials.   [name].Clemon Devaul MSW, [name] Licensed Clinical Social Worker [facility] Care Management [phone]

## 2018-05-04 NOTE — Chronic Care Management (AMB) (Signed)
Client had scheduled visit at [place] with Dr. Livia Snellen  on 05/04/2018.  While client was at [place], [name] spoke with client face to face and gave client EMMI education materials on:  "Fatigue" "Relaxation Techniques" "Heart Failure-About Alcohol, Smoking" and "Chronic Pain".  Since client has reading difficulty, [name] asked client to request that home health aide who assists him to  help read these materials to client when possible. Client agreed to this plan.  Client said he would get home health aide to read these educational materials to him.  [name] later called client on 05/04/18. Client reported that he is to see Dr. Livia Snellen again in 3 months for medical appointment.  Client reported he had adequate food supply and was taking his medications as prescribed. He said he had a new home health aide assisting him weekly as scheduled. [name] talked with client about transport needs of client.   Current Barriers:   Transport needs .  Needs help in scheduling transport assistance/support  Goal: (patient stated): "I need to make sure I get a ride to heart doctor"  Clinical Social Work Clinical Goal(s):  Over next 30 days client will verbalize understanding of transport resources for client in the area.   Interventions:  [name] collaborated with [place] to arrange transport assist for client to cardiology appointment recently with Dr. Percival Spanish and to PCP appointment for client with Dr. Livia Snellen on 05/04/2018.  [name] encouraged client to inform [name] when client has new upcoming medical appointments scheduled. [name] [name] encouraged client to use [facility] for transport help when possible  Patient Self Care Activities:  . Patient contacts [name] to inform [name] of upcoming client appointments . Patient contacts [place], when possible, to seek transport help  Plan:  . [name] to call client  within next 2 weeks to assess client transport needs. . Client to call [name] with information on upcoming client medical appointments . [name] to collaborate with RNCM [name] regarding nursing needs of client   Orby Tangen.Maxima Skelton MSW, [name] Licensed Clinical Social Worker [facility] [phone]

## 2018-05-06 ENCOUNTER — Ambulatory Visit: Payer: Medicare Other | Admitting: Family Medicine

## 2018-05-06 NOTE — Progress Notes (Signed)
Hello Lars,  Your lab result is normal.Some minor variations that are not significant are commonly marked abnormal, but do not represent any medical problem for you.  Best regards, Jenea Dake, M.D.

## 2018-05-09 NOTE — Addendum Note (Signed)
Addended by: Shellia Cleverly on: 05/09/2018 09:14 AM   Modules accepted: Orders

## 2018-05-11 ENCOUNTER — Ambulatory Visit: Payer: Self-pay | Admitting: Licensed Clinical Social Worker

## 2018-05-11 DIAGNOSIS — G809 Cerebral palsy, unspecified: Secondary | ICD-10-CM

## 2018-05-11 DIAGNOSIS — Z550 Illiteracy and low-level literacy: Secondary | ICD-10-CM

## 2018-05-11 DIAGNOSIS — R482 Apraxia: Secondary | ICD-10-CM

## 2018-05-11 DIAGNOSIS — F411 Generalized anxiety disorder: Secondary | ICD-10-CM

## 2018-05-11 NOTE — Chronic Care Management (AMB) (Signed)
  Chronic Care Management    Clinical Social Work General Follow Up Note  05/11/2018 Name: [name] MRN: [id number] DOB: 06/20/1955   Referred by: PCP, Dr.[name] for psychosocial needs assessment.   Review of patient status, including review of consultants reports, relevant laboratory and other test results, and collaboration with appropriate care team members and the patient's provider was performed as part of comprehensive patient evaluation and provision of chronic care management services.    Last CCM Appointment: 05/04/18   Depression screen PHQ 2/9 04/11/2018  Decreased Interest 1  Down, Depressed, Hopeless 1  PHQ - 2 Score 2  Altered sleeping 0  Tired, decreased energy 1  Change in appetite 0  Feeling bad or failure about yourself  1  Trouble concentrating 1  Moving slowly or fidgety/restless 0  Suicidal thoughts 0  PHQ-9 Score 5  Difficult doing work/chores -  Some recent data might be hidden     GAD 7 : Generalized Anxiety Score 05/01/2016 03/31/2016  Nervous, Anxious, on Edge 1 3  Control/stop worrying 1 3  Worry too much - different things 1 3  Trouble relaxing 0 2  Restless 0 2  Easily annoyed or irritable 0 0  Afraid - awful might happen 0 0  Total GAD 7 Score 3 13  Anxiety Difficulty Not difficult at all Not difficult at all     Goals Addressed    . COMPLETED: " I want to make sure I get ride to heart doctor" (pt-stated)       Clinical Social Work Clinical Goal(s): Over the next 30 days, client will verbalize understanding of transport resources.   Interventions: . Confirmed that patient attended scheduled cardiology appointment on 04/27/18  Patient Self Care Activities:  . Patient knows to contact transport Education officer, museum when assistance needed for transport services. . Patient contacts [name] to discuss upcoming appointments.     . COMPLETED: "I need help with getting food" (pt-stated)       Clinical Goal(s):   Client will express understanding of  food resources agencies in area in next 30 days and understand process for applying for food assistance.  Interventions: Confirmed that patient has access to adequate food supply and understands resources/agencies available    . : " I need to get through all this legal stuff " (pt-stated)       Clinical Goal(s):  Over the next 30 days, patient will affirm attendance to scheduled court hearing and verbalize understanding of any ongoing legal obligations.   Interventions:  [name] reviewed ongoing needs around legal issues Confirmed that patient remains in contact with his attorney Confirmed that patient has transportation to scheduled court appearance on 2/520        Follow Up Plan: Appointment scheduled for [name] follow up with client by phone on:  05/25/2018    [name].Irean Kendricks MSW, [name] Licensed Clinical Social Worker [facility] [phone]

## 2018-05-11 NOTE — Patient Instructions (Signed)
Licensed Clinical Social Worker Visit Information  Goals we discussed today:  Goals Addressed            This Visit's Progress   . COMPLETED: " I want to make sure I get ride to heart doctor" (pt-stated)       Clinical Social Work Clinical Goal(s): Over the next 30 days, client will verbalize understanding of transport resources.   Interventions: . Confirmed that patient attended scheduled cardiology appointment on 04/27/18   Patient Self Care Activities:  . Patient knows to contact transport Education officer, museum when assistance needed for transport services. . Patient contacts [name] to discuss upcoming appointments.        . COMPLETED: "I need help with getting food" (pt-stated)       Clinical Goal(s):   Client will express understanding of food resources agencies in area in next 30 days and understand process for applying for food assistance.  Interventions: Confirmed that patient has access to adequate food supply and understands resources/agencies available    . : " I need to get through all this legal stuff " (pt-stated)       Clinical Goal(s):  Over the next 30 days, patient will affirm attendance to scheduled court hearing and verbalize understanding of any ongoing legal obligations.   Interventions:  [name] reviewed ongoing needs around legal issues Confirmed that patient remains in contact with his attorney Confirmed that patient has transportation to scheduled court appearance on 2/520       Follow Up Plan: Appointment scheduled for [name] follow up with client by phone on: 05/25/2018  The patient verbalized understanding of instructions provided today and declined a print copy of patient instruction materials.   [name].Shamere Dilworth MSW, [name] Licensed Clinical Social Worker [facility] [phone]   SIGNATURE

## 2018-05-13 ENCOUNTER — Telehealth: Payer: Medicare Other

## 2018-05-24 ENCOUNTER — Ambulatory Visit (INDEPENDENT_AMBULATORY_CARE_PROVIDER_SITE_OTHER): Payer: Medicare Other | Admitting: *Deleted

## 2018-05-24 DIAGNOSIS — I252 Old myocardial infarction: Secondary | ICD-10-CM | POA: Diagnosis not present

## 2018-05-24 DIAGNOSIS — I25118 Atherosclerotic heart disease of native coronary artery with other forms of angina pectoris: Secondary | ICD-10-CM

## 2018-05-24 DIAGNOSIS — I209 Angina pectoris, unspecified: Secondary | ICD-10-CM

## 2018-05-24 NOTE — Patient Instructions (Signed)
Visit Information  Goals    . " I am having pain in my chest more often" (pt-stated)     Clinical Social Work Clinical Goal(s): Over next 72 hours, patient will collaborate with nurse case manager regarding management of chest pain  Interventions: . Asked RN Case Manager to speak with pt on the phone immediately at patient's report of chest pain . Referred to nurse case manager for nursing CCM services  Patient Self Care Activities:  . Patient self administers medications including Ntg . Monitors chest pain and reports       . "I need to figure out what is going on with my heart" (pt-stated)     Nurse Case Manager Clinical Goal(s): Over the next 6 months patient will demonstrate self-management of chest pain and will have no ED for angina   Interventions:  . Reviewed provider's plan of care for angina with patient . Reviewed medications with patient . Asked patient to verbalize plan of care for his typical angina  Patient Self Care Activities:  . Patient monitors chest pain and reports  . Patient self administers medications   *See Past Updates for previous documentation      . "I need to know what this new medicine is in my pill pack" (pt-stated)     Current Barriers:  . illiteracy . Lack of family involvement  Nurse Case Manager Clinical Goal(s): Over the next two months patient will be able to identify what each of his medicines are used to treat   Interventions:  . Chart review completed . Collaborated with pharmacist at [facility]. They verified that Dr Livia Snellen ordered Aspirin 325mg  to replace Aspirin 81mg .  . I will callaborate with Dr Livia Snellen to see what the correct dose of aspirin should be . I will let Elroy know which dose should be included in the pill pack  Patient Self Care Activities:  . Takes prepackaged medication on his own  *initial goal documentation      . "I want to make sure that I know what all of the pills I am taking are  for. There are two new pills that I don't know what they're for." (pt-stated)     Current Barriers:  . Cognitive deficits . illiteracy  Nurse Case Manager Clinical Goal(s): Over the next 2 months patient will be able to identify each of his medications by site and know what they are used for.  Interventions:  . Reviewed current medication list and their prescribed dates . Crossreferenced the prescription dates with the office notes and provided an explanation to the patient . Advised that fexofenadine (Allegra) was prescribed in response to his request for something for his runny nose . Advised that Celebrex was prescribed in response to his complaints of joint pain. [name] with [facility] and had them remove the Celebrex from the pill package and provide it in a sepearate bottle so that Mr Valdes can take it daily as needed at his discretion. . Will collaborate with pharmacy to provide patient with a visual resource to tell what each pill does  Patient Self Care Activities:  . Self administers medications . Aware of the number of pills that he takes and what color they are . Will question someone if he thinks something is wrong   Please see past updates related to this goal by clicking on the "Past Updates" button in the selected goal       . "I've had a heart attack before  and I don't want to have another one." (pt-stated)     Current Barriers:  . Cognitive deficits . Illiteracy . Dependence on others for transportation  Nurse Case Manager Clinical Goal(s): Over the next 2 months patient will demonstrate desire to improve his health as evidenced by reduced cigarette smoking and increased physical activity.  Interventions:   Reviewed medications with patient  Reiterated the effects of smoking on the heart and cardiovascular system and discussed smoking cessation strategies and encouraged him to quit  Reminded him to follow up with Dr Percival Spanish in 1 year  Increase  physical activity as tolerated with a goal of 150 minutes of moderate activity a week  Patient Self Care Activities:  . Self administers medications . Has the ability to exercise . He does some physical labor but not consistently . Aware of s/s of heart attack  Please see past updates related to this goal by clicking on the "Past Updates" button in the selected goal       . : " I need to get through all this legal stuff " (pt-stated)     Clinical Goal(s):  Over the next 30 days, patient will affirm attendance to scheduled court hearing and verbalize understanding of any ongoing legal obligations.   Interventions:  [name] reviewed ongoing needs around legal issues Confirmed that patient remains in contact with his attorney Confirmed that patient has transportation to scheduled court appearance on 2/520    . Exercise 150 min/wk Moderate Activity    . Have 3 meals a day       Education or Materials Provided:  . Explained that the "orange" pill is an aspirin and that it will help to keep his blood thin. I will need to verify that the dose is correct though.   The patient verbalized understanding of instructions provided today and declined a print copy of patient instruction materials.   Face to Face appointment with CCM team member scheduled for: March, 2019 Telephone f/u to patient over the next 3 days  [name], RN-BC, BSN Nurse Case Manager [place] Ph: [phone]

## 2018-05-24 NOTE — Chronic Care Management (AMB) (Signed)
Chronic Care Management   Follow Up Note   05/24/2018 Name: [name] MRN: [id number] DOB: Jul 06, 1955  Referred by: [name], MD Reason for referral : Chronic Care Management (Return telephone call at patient's request to clarify "new orange medicine")  Returned call to [name] per his request after speaking with [name], [name] to clarify what the new medication in his pill pack is.   Subjective: [name] was concerned because there was a new orange pill in his pill pack that he didn't expect.   Objective:   Allergies as of 05/24/2018      Reactions   Chantix [varenicline Tartrate] Nausea And Vomiting    Medication List   Accurate as of May 24, 2018  3:45 PM. Always use your most recent med list.        acetaminophen 325 MG tablet Commonly known as:  TYLENOL Take 2 tablets (650 mg total) by mouth every 6 (six) hours as needed for mild pain or headache.   albuterol 108 (90 Base) MCG/ACT inhaler Commonly known as:  PROAIR HFA 1-2 puffs every 6 hours as needed wheezing or shortness of breath.   aspirin 325 MG EC tablet Take 1 tablet (325 mg total) by mouth daily. (81mg  was ordered on 05/02/18 with a note requesting the pharmacy to include it in his pill pack. That order was based on the order Dr Percival Spanish put in at his visit on 04/27/18 for Aspirin 81mg  OTC. 81mg  was discontinued on 05/04/18 when 325mg  was ordered at his visit with Dr Livia Snellen)  atorvastatin 40 MG tablet Commonly known as:  LIPITOR Take 1 tablet (40 mg total) by mouth daily at 6 PM.   buPROPion 150 MG 12 hr tablet Commonly known as:  WELLBUTRIN SR Take 1 tablet (150 mg total) by mouth 2 (two) times daily.   celecoxib 200 MG capsule Commonly known as:  CELEBREX Take 200 mg by mouth daily.   DULoxetine 30 MG capsule Commonly known as:  CYMBALTA Take 2 capsules (60 mg total) by mouth daily. WITH A FULL STOMACH AT SUPPER TIME   fexofenadine 180 MG tablet Commonly known as:  ALLEGRA Take 1  tablet (180 mg total) by mouth daily. For allergy symptoms   fluticasone 50 MCG/ACT nasal spray Commonly known as:  FLONASE Place 2 sprays into both nostrils daily.   fluticasone furoate-vilanterol 100-25 MCG/INH Aepb Commonly known as:  BREO ELLIPTA Inhale 1 puff into the lungs daily.   isosorbide mononitrate 30 MG 24 hr tablet Commonly known as:  IMDUR Take 1 tablet (30 mg total) by mouth daily.   lisinopril 20 MG tablet Commonly known as:  PRINIVIL,ZESTRIL Take 1 tablet (20 mg total) by mouth daily.   metoprolol tartrate 25 MG tablet Commonly known as:  LOPRESSOR Take 1 tablet (25 mg total) by mouth 2 (two) times daily.   nitroGLYCERIN 0.4 MG SL tablet Commonly known as:  NITROSTAT Place 1 tablet (0.4 mg total) under the tongue every 5 (five) minutes x 3 doses as needed for chest pain.   pantoprazole 40 MG tablet Commonly known as:  PROTONIX Take 1 tablet (40 mg total) by mouth 2 (two) times daily.   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 He has a past history of MI and was referred to CCM due to HTN, CAD,  Cerbral Palsy, angina, Depression.   Goals Addressed            This Visit's Progress   . "I need to figure out what is going on with my heart" (pt-stated)       Nurse Case Manager Clinical Goal(s): Over the next 6 months patient will demonstrate self-management of chest pain and will have no ED for angina   Interventions:  . Reviewed provider's plan of care for angina with patient . Reviewed medications with patient . Asked patient to verbalize plan of care for his typical angina  Patient Self Care Activities:  . Patient monitors chest pain and reports  . Patient self administers medications   *See Past Updates for previous documentation      . "I need to know what this new medicine is  in my pill pack" (pt-stated)       Current Barriers:  . illiteracy . Lack of family involvement  Nurse Case Manager Clinical Goal(s): Over the next two months patient will be able to identify what each of his medicines are used to treat   Interventions:  . Chart review completed . Collaborated with pharmacist at [facility]. They verified that Dr Livia Snellen ordered Aspirin 325mg  to replace Aspirin 81mg .  . Discussed with Dr Livia Snellen and his main goal was to make sure that Tyhir was taking an aspirin daily. He did authorize the change back to 81mg  since Dr Percival Spanish recommended that dose . Interior advised to d/c aspirin 325 and to reorder Aspirin 81mg . They will make this change prior to next pill pack delivery.  . Requested that the remind [place] of the change at the time of delivery . I will call Dhairya today to let him know that there is a change . Request that he bring his pill pack in with him to his CCM appointment in March  Patient Self Care Activities:  . Takes prepackaged medication on his own  Please see past updates related to this goal by clicking on the "Past Updates" button in the selected goal       . COMPLETED: "I thought Dr Percival Spanish was going to prescribe me something new at my appointment but it wasn't put in my pack." (pt-stated)       Current Barriers:  . Illiteracy . Does not know what each medication is or what it is used for  Nurse Case Manager Clinical Goal(s): Over the next 2 weeks patient will be able to recognize that Aspirin 81mg  has been added to his pill pack.   Interventions:  . Explained that Dr Percival Spanish did prescirbe Aspirin 81 mg but that it is an OTC medication.  . To help Mr [name] take his medications as prescribed, I collaborated with [employer] and reordered Aspirin 81 mg with the instructions to include it daily in his pill pack.     Patient Self Care Activities:  . Self administers medications . Aware of how many medications  he takes daily and what they look like   Please see past updates related to this goal by clicking on the "Past Updates" button in the selected goal       . "I've had a heart attack before and I don't want to have another one." (pt-stated)       Current Barriers:  . Cognitive deficits . Illiteracy . Dependence on others for transportation  Nurse Case Manager Clinical Goal(s): Over the next 2 months patient will demonstrate desire to improve his health  as evidenced by reduced cigarette smoking and increased physical activity.  Interventions:   Reviewed medications with patient  Reiterated the effects of smoking on the heart and cardiovascular system and discussed smoking cessation strategies and encouraged him to quit  Reminded him to follow up with Dr Percival Spanish in 1 year  Increase physical activity as tolerated with a goal of 150 minutes of moderate activity a week  Patient Self Care Activities:  . Self administers medications . Has the ability to exercise . He does some physical labor but not consistently . Aware of s/s of heart attack  Please see past updates related to this goal by clicking on the "Past Updates" button in the selected goal             PLAN: Face to Face appointment with CCM team member scheduled for: March 2019 Telephone f/u to patient over the next 7 days    [name], RN-BC, BSN Nurse Case Manager [place] Ph: [phone]

## 2018-05-25 ENCOUNTER — Telehealth: Payer: Self-pay | Admitting: Licensed Clinical Social Worker

## 2018-05-25 ENCOUNTER — Ambulatory Visit: Payer: Self-pay | Admitting: Licensed Clinical Social Worker

## 2018-05-25 DIAGNOSIS — F411 Generalized anxiety disorder: Secondary | ICD-10-CM

## 2018-05-25 DIAGNOSIS — G809 Cerebral palsy, unspecified: Secondary | ICD-10-CM

## 2018-05-25 DIAGNOSIS — R482 Apraxia: Secondary | ICD-10-CM

## 2018-05-25 NOTE — Patient Instructions (Signed)
Licensed Clinical Education officer, museum Visit Information  Material provided:  No  Goals we discussed today:            Goals             . " I am having pain in my chest more often" (pt-stated)      Clinical Social Work Clinical Goal(s): Over next 72 hours, patient will collaborate with nurse case manager regarding management of chest pain  Interventions:  Asked RN Case Manager to speak with pt on the phone immediately at patient's report of chest pain  Referred to nurse case manager for nursing CCM services  Patient Self Care Activities:   Patient self administers medications including Ntg  Monitors chest pain and reports    . "I need to figure out what is going on with my heart" (pt-stated)      Nurse Case Manager Clinical Goal(s): Over the next 6 months patient will demonstrate self-management of chest pain and will have no ED for angina   Interventions:   Reviewed provider's plan of care for angina with patient  Reviewed medications with patient  Asked patient to verbalize plan of care for his typical angina  Patient Self Care Activities:   Patient monitors chest pain and reports   Patient self administers medications   *See Past Updates for previous documentation      . "I need to know what this new medicine is in my pill pack" (pt-stated)      Current Barriers:   illiteracy  Lack of family involvement  Nurse Case Manager Clinical Goal(s): Over the next two months patient will be able to identify what each of his medicines are used to treat   Interventions:   Chart review completed  Collaborated with pharmacist at [facility]. They verified that Dr Livia Snellen ordered Aspirin 325mg  to replace Aspirin 81mg .   I will callaborate with Dr Livia Snellen to see what the correct dose of aspirin should be  I will let [place] know which dose should be included in the pill pack  Patient Self Care Activities:   Takes prepackaged medication  on his own  *initial goal documentation      . "I want to make sure that I know what all of the pills I am taking are for. There are two new pills that I don't know what they're for." (pt-stated)      Current Barriers:   Cognitive deficits  illiteracy  Nurse Case Manager Clinical Goal(s): Over the next 2 months patient will be able to identify each of his medications by site and know what they are used for.  Interventions:   Reviewed current medication list and their prescribed dates  Crossreferenced the prescription dates with the office notes and provided an explanation to the patient  Advised that fexofenadine (Allegra) was prescribed in response to his request for something for his runny nose  Advised that Celebrex was prescribed in response to his complaints of joint pain.  Collaborated with [facility] and had them remove the Celebrex from the pill package and provide it in a sepearate bottle so that Mr Hechler can take it daily as needed at his discretion.  Will collaborate with pharmacy to provide patient with a visual resource to tell what each pill does  Patient Self Care Activities:   Self administers medications  Aware of the number of pills that he takes and what color they are  Will question someone if he thinks something is wrong  Please see past updates related to this goal by clicking on the "Past Updates" button in the selected goal       . "I've had a heart attack before and I don't want to have another one." (pt-stated)      Current Barriers:   Cognitive deficits  Illiteracy  Dependence on others for transportation  Nurse Case Manager Clinical Goal(s): Over the next 2 months patient will demonstrate desire to improve his health as evidenced by reduced cigarette smoking and increased physical activity.  Interventions:   Reviewed medications with patient  Reiterated the effects of smoking on the heart and  cardiovascular system and discussed smoking cessation strategies and encouraged him to quit  Reminded him to follow up with Dr Percival Spanish in 1 year  Increase physical activity as tolerated with a goal of 150 minutes of moderate activity a week  Patient Self Care Activities:   Self administers medications  Has the ability to exercise  He does some physical labor but not consistently  Aware of s/s of heart attack  Please see past updates related to this goal by clicking on the "Past Updates" button in the selected goal       . : " I need to get through all this legal stuff " (pt-stated)       Barriers:  Illiteracy  Transportation challenges  Financial issues  Clinical Goal(s):  Over the next 30 days, patient will affirm attendance to scheduled court hearing and verbalize understanding of any ongoing legal obligations.   Interventions:  [name] reviewed ongoing needs around legal issues Confirmed that patient remains in contact with his attorney Confirmed that patient has transportation to scheduled court appearance on 2/520  Encouraged client to call [name] as needed to discuss client needs and to call RN [name] as needed to discuss nursing needs of client.     Follow Up Plan: [name] to call client in 2 weeks to assess client status regarding legal issues faced by client   The patient verbalized understanding of instructions provided today and declined a print copy of patient instruction materials.    [name].Britten Seyfried MSW, [name] Licensed Clinical Social Worker [facility] Care Management [phone]

## 2018-05-25 NOTE — Chronic Care Management (AMB) (Signed)
Chronic Care Management    Clinical Social Work General Follow Up Note  05/25/2018 Name: [name] MRN: [id number] DOB: 07-03-1955   Referred by: PCP, Dr.Warren [name] for psychosocial assessment  Review of patient status, including review of consultants reports, relevant laboratory and other test results, and collaboration with appropriate care team members and the patient's provider was performed as part of comprehensive patient evaluation and provision of chronic care management services.    Last CCM Appointment: 05/25/2018  Depression screen PHQ 2/9 04/11/2018  Decreased Interest 1  Down, Depressed, Hopeless 1  PHQ - 2 Score 2  Altered sleeping 0  Tired, decreased energy 1  Change in appetite 0  Feeling bad or failure about yourself  1  Trouble concentrating 1  Moving slowly or fidgety/restless 0  Suicidal thoughts 0  PHQ-9 Score 5  Difficult doing work/chores -  Some recent data might be hidden     GAD 7 : Generalized Anxiety Score 05/01/2016 03/31/2016  Nervous, Anxious, on Edge 1 3  Control/stop worrying 1 3  Worry too much - different things 1 3  Trouble relaxing 0 2  Restless 0 2  Easily annoyed or irritable 0 0  Afraid - awful might happen 0 0  Total GAD 7 Score 3 13  Anxiety Difficulty Not difficult at all Not difficult at all    Goals    . " I am having pain in my chest more often" (pt-stated)     Clinical Social Work Clinical Goal(s): Over next 72 hours, patient will collaborate with nurse case manager regarding management of chest pain  Interventions: . Asked RN Case Manager to speak with pt on the phone immediately at patient's report of chest pain . Referred to nurse case manager for nursing CCM services  Patient Self Care Activities:  . Patient self administers medications including Ntg . Monitors chest pain and reports    . "I need to figure out what is going on with my heart" (pt-stated)     Nurse Case Manager Clinical Goal(s): Over the next  6 months patient will demonstrate self-management of chest pain and will have no ED for angina   Interventions:  . Reviewed provider's plan of care for angina with patient . Reviewed medications with patient . Asked patient to verbalize plan of care for his typical angina  Patient Self Care Activities:  . Patient monitors chest pain and reports  . Patient self administers medications   *See Past Updates for previous documentation      . "I need to know what this new medicine is in my pill pack" (pt-stated)     Current Barriers:  . illiteracy . Lack of family involvement  Nurse Case Manager Clinical Goal(s): Over the next two months patient will be able to identify what each of his medicines are used to treat   Interventions:  . Chart review completed . Collaborated with pharmacist at [facility]. They verified that Dr Livia Snellen ordered Aspirin 325mg  to replace Aspirin 81mg .  . I will callaborate with Dr Livia Snellen to see what the correct dose of aspirin should be . I will let [place] know which dose should be included in the pill pack  Patient Self Care Activities:  . Takes prepackaged medication on his own  *initial goal documentation      . "I want to make sure that I know what all of the pills I am taking are for. There are two new pills that I don't know  what they're for." (pt-stated)     Current Barriers:  . Cognitive deficits . illiteracy  Nurse Case Manager Clinical Goal(s): Over the next 2 months patient will be able to identify each of his medications by site and know what they are used for.  Interventions:  . Reviewed current medication list and their prescribed dates . Crossreferenced the prescription dates with the office notes and provided an explanation to the patient . Advised that fexofenadine (Allegra) was prescribed in response to his request for something for his runny nose . Advised that Celebrex was prescribed in response to his complaints of  joint pain. [name] with [facility] and had them remove the Celebrex from the pill package and provide it in a sepearate bottle so that Mr Nanney can take it daily as needed at his discretion. . Will collaborate with pharmacy to provide patient with a visual resource to tell what each pill does  Patient Self Care Activities:  . Self administers medications . Aware of the number of pills that he takes and what color they are . Will question someone if he thinks something is wrong   Please see past updates related to this goal by clicking on the "Past Updates" button in the selected goal       . "I've had a heart attack before and I don't want to have another one." (pt-stated)     Current Barriers:  . Cognitive deficits . Illiteracy . Dependence on others for transportation  Nurse Case Manager Clinical Goal(s): Over the next 2 months patient will demonstrate desire to improve his health as evidenced by reduced cigarette smoking and increased physical activity.  Interventions:   Reviewed medications with patient  Reiterated the effects of smoking on the heart and cardiovascular system and discussed smoking cessation strategies and encouraged him to quit  Reminded him to follow up with Dr Percival Spanish in 1 year  Increase physical activity as tolerated with a goal of 150 minutes of moderate activity a week  Patient Self Care Activities:  . Self administers medications . Has the ability to exercise . He does some physical labor but not consistently . Aware of s/s of heart attack  Please see past updates related to this goal by clicking on the "Past Updates" button in the selected goal       . : " I need to get through all this legal stuff " (pt-stated)      Barriers:  Illiteracy  Transportation challenges  Financial issues  Clinical Goal(s):  Over the next 30 days, patient will affirm attendance to scheduled court hearing and verbalize understanding of any ongoing  legal obligations.   Interventions:  [name] reviewed ongoing needs around legal issues Confirmed that patient remains in contact with his attorney Confirmed that patient has transportation to scheduled court appearance on 2/520  Encouraged client to call [name] as needed to discuss client needs and to call RN [name] as needed to discuss nursing needs of client.     Follow Up Plan: [name] to call client in 2 weeks to assess client status regarding legal issues faced by client    [name].Jyssica Rief MSW, [name] Licensed Clinical Social Worker [facility] Care Management [phone]

## 2018-05-26 ENCOUNTER — Telehealth: Payer: Medicare Other

## 2018-05-30 ENCOUNTER — Ambulatory Visit: Payer: Self-pay | Admitting: Licensed Clinical Social Worker

## 2018-05-30 ENCOUNTER — Other Ambulatory Visit: Payer: Self-pay

## 2018-05-30 DIAGNOSIS — Z550 Illiteracy and low-level literacy: Secondary | ICD-10-CM

## 2018-05-30 DIAGNOSIS — R482 Apraxia: Secondary | ICD-10-CM

## 2018-05-30 DIAGNOSIS — G809 Cerebral palsy, unspecified: Secondary | ICD-10-CM

## 2018-05-30 DIAGNOSIS — F411 Generalized anxiety disorder: Secondary | ICD-10-CM

## 2018-05-30 NOTE — Patient Outreach (Signed)
[facility]) Care Management  05/30/2018  Manoj Enriquez Christner 1956-02-19 [id number]   Transportation arranged via [place] for appointment at [place] on 06/20/18 @ 10:00 AM.  [name], [place] Worker [phone]

## 2018-05-30 NOTE — Chronic Care Management (AMB) (Signed)
Chronic Care Management    Clinical Social Work General Follow Up Note  05/30/2018 Name: [name] MRN: [id number] DOB: 1956/03/07   Referred by: PCP, James.Warren Hale for psychosocial assessment and support regarding community resources  Review of patient status, including review of consultants reports, relevant laboratory and other test results, and collaboration with appropriate care team members and the patient's provider was performed as part of comprehensive patient evaluation and provision of chronic care management services.    Last CCM Appointment: 05/30/2018  Depression screen PHQ 2/9 04/11/2018  Decreased Interest 1  Down, Depressed, Hopeless 1  PHQ - 2 Score 2  Altered sleeping 0  Tired, decreased energy 1  Change in appetite 0  Feeling bad or failure about yourself  1  Trouble concentrating 1  Moving slowly or fidgety/restless 0  Suicidal thoughts 0  PHQ-9 Score 5  Difficult doing work/chores -  Some recent data might be hidden     GAD 7 : Generalized Anxiety Score 05/01/2016 03/31/2016  Nervous, Anxious, on Edge 1 3  Control/stop worrying 1 3  Worry too much - different things 1 3  Trouble relaxing 0 2  Restless 0 2  Easily annoyed or irritable 0 0  Afraid - awful might happen 0 0  Total GAD 7 Score 3 13  Anxiety Difficulty Not difficult at all Not difficult at all    Goals    . " I am having pain in my chest more often" (pt-stated)     Clinical Social Work Clinical Goal(s): Over next 72 hours, patient will collaborate with nurse case manager regarding management of chest pain  Interventions: . Asked RN Case Manager to speak with pt on the phone immediately at patient's report of chest pain . Referred to nurse case manager for nursing CCM services  Patient Self Care Activities:  . Patient self administers medications including Ntg . Monitors chest pain and reports    . "I need to figure out what is going on with my heart" (pt-stated)     Nurse  Case Manager Clinical Goal(s): Over the next 6 months patient will demonstrate self-management of chest pain and will have no ED for angina   Interventions:  . Reviewed provider's plan of care for angina with patient . Reviewed medications with patient . Asked patient to verbalize plan of care for his typical angina  Patient Self Care Activities:  . Patient monitors chest pain and reports  . Patient self administers medications   *See Past Updates for previous documentation      . "I need to know what this new medicine is in my pill pack" (pt-stated)     Current Barriers:  . illiteracy . Lack of family involvement  Nurse Case Manager Clinical Goal(s): Over the next two months patient will be able to identify what each of his medicines are used to treat   Interventions:  . Chart review completed . Collaborated with pharmacist at [facility]. They verified that [name] ordered Aspirin 325mg  to replace Aspirin 81mg .  . Discussed with [name] and his main goal was to make sure that [name] was taking an aspirin daily. He did authorize the change back to 81mg  since [name] recommended that dose . [place] advised to d/c aspirin 325 and to reorder Aspirin 81mg . They will make this change prior to next pill pack delivery.  . Requested that the remind [name] of the change at the time of delivery . I will call [name]  today to let him know that there is a change . Request that he bring his pill pack in with him to his CCM appointment in March  Patient Self Care Activities:  . Takes prepackaged medication on his own  Please see past updates related to this goal by clicking on the "Past Updates" button in the selected goal     . "I want to make sure that I know what all of the pills I am taking are for. There are two new pills that I don't know what they're for." (pt-stated)     Current Barriers:  . Cognitive deficits . illiteracy  Nurse Case Manager Clinical  Goal(s): Over the next 2 months patient will be able to identify each of his medications by site and know what they are used for.  Interventions:  . Reviewed current medication list and their prescribed dates . Crossreferenced the prescription dates with the office notes and provided an explanation to the patient . Advised that fexofenadine (Allegra) was prescribed in response to his request for something for his runny nose . Advised that Celebrex was prescribed in response to his complaints of joint pain. [name] with [facility] and had them remove the Celebrex from the pill package and provide it in a sepearate bottle so that [name] can take it daily as needed at his discretion. . Will collaborate with pharmacy to provide patient with a visual resource to tell what each pill does  Patient Self Care Activities:  . Self administers medications . Aware of the number of pills that he takes and what color they are . Will question someone if he thinks something is wrong   Please see past updates related to this goal by clicking on the "Past Updates" button in the selected goal       . "I've had a heart attack before and I don't want to have another one." (pt-stated)     Current Barriers:  . Cognitive deficits . Illiteracy . Dependence on others for transportation  Nurse Case Manager Clinical Goal(s): Over the next 2 months patient will demonstrate desire to improve his health as evidenced by reduced cigarette smoking and increased physical activity.  Interventions:   Reviewed medications with patient  Reiterated the effects of smoking on the heart and cardiovascular system and discussed smoking cessation strategies and encouraged him to quit  Reminded him to follow up with [name] in 1 year  Increase physical activity as tolerated with a goal of 150 minutes of moderate activity a week  Patient Self Care Activities:  . Self administers medications . Has the  ability to exercise . He does some physical labor but not consistently . Aware of s/s of heart attack  Please see past updates related to this goal by clicking on the "Past Updates" button in the selected goal       Goal : . : " I need to get through all this legal stuff " (pt-stated)     Clinical Goal(s):  Over the next 30 days, patient will affirm attendance to scheduled court hearing and verbalize understanding of any ongoing legal obligations.   Interventions:   [name] reviewed ongoing needs of client around legal issues of client   Confirmed that patient remains in contact with his attorney  [name] talked with client about client's last court hearing appearance with attorney on 05/25/2018  [name] encouraged client to use [place] as needed for attendance at required court hearings.  Patient's Self Care Activities:  Client attends scheduled medical appointments  Client takes medications as prescribed.  Client communicates with [name] to discuss legal events of client and to discuss community resources needs of client.  Client communicates with RN CM to discuss nursing needs of client.     Plan:    [name] to call client in 2 weeks to discuss with client legal issues client is facing and to encourage client to work with attorney of client regarding client's legal issues  Client to call RN CM as needed regarding nursing issues  Client to office of James. Livia Hale to report medical needs of client  Client to attend scheduled medical appointments.    . Exercise 150 min/wk Moderate Activity    . Have 3 meals a day       Follow Up Plan: [name] to call client in 2 weeks to discuss with client legal issues faced by client and to encourage client to talk with attorney of client related to client's legal issues.   [name].Jennise Both MSW, [name] Licensed Clinical Social Worker [facility] Care Management [phone]

## 2018-05-30 NOTE — Patient Instructions (Signed)
Licensed Clinical Water engineer Provided: No  Goals we discussed today.:   Goal : . : " I need to get through all this legal stuff " (pt-stated)       Clinical Goal(s):  Over the next 30 days, patient will affirm attendance to scheduled court hearing and verbalize understanding of any ongoing legal obligations.   Interventions:   [name] reviewed ongoing needs of client around legal issues of client   Confirmed that patient remains in contact with his attorney  [name] talked with client about client's last court hearing appearance with attorney on 05/25/2018  [name] encouraged client to use [place] as needed for attendance at required court hearings.  Patient's Self Care Activities:  Client attends scheduled medical appointments  Client takes medications as prescribed.  Client communicates with [name] to discuss legal events of client and to discuss community resources needs of client.  Client communicates with RN CM to discuss nursing needs of client.     Plan:    [name] to call client in 2 weeks to discuss with client legal issues client is facing and to encourage client to work with attorney of client regarding client's legal issues  Client to call RN CM as needed regarding nursing issues  Client to office of Dr. Livia Snellen to report medical needs of client  Client to attend scheduled medical appointments.       Follow Up Plan: [name] to call client in 2 weeks to discuss with client legal issues faced by client and to encourage client to talk with attorney of client related to client's legal issues.  The patient verbalized understanding of instructions provided today and declined a print copy of patient instruction materials.    [name].Meoshia Billing MSW, [name] Licensed Clinical Social Worker [facility] Care Management [phone]

## 2018-05-31 ENCOUNTER — Telehealth: Payer: Medicare Other

## 2018-05-31 ENCOUNTER — Telehealth: Payer: Self-pay | Admitting: Family Medicine

## 2018-05-31 NOTE — Telephone Encounter (Signed)
Patient is wanting to speak to Nicki Reaper about helping him set up a ride to get to court on March 21st. Patient did ask for [name] number because he had lost it, I gave the patient the phone # and said that he still would like to speak to [place].

## 2018-06-10 ENCOUNTER — Telehealth: Payer: Self-pay | Admitting: Family Medicine

## 2018-06-14 ENCOUNTER — Telehealth: Payer: Self-pay | Admitting: Family Medicine

## 2018-06-14 NOTE — Telephone Encounter (Signed)
PT has called wanting to make sure that [name] Nicki Reaper has him scheduled to be picked up by [name] on 3/2 for his appointment here in the office.

## 2018-06-15 ENCOUNTER — Ambulatory Visit: Payer: Self-pay | Admitting: Licensed Clinical Social Worker

## 2018-06-15 DIAGNOSIS — G809 Cerebral palsy, unspecified: Secondary | ICD-10-CM

## 2018-06-15 DIAGNOSIS — Z550 Illiteracy and low-level literacy: Secondary | ICD-10-CM

## 2018-06-15 DIAGNOSIS — R482 Apraxia: Secondary | ICD-10-CM

## 2018-06-15 DIAGNOSIS — F411 Generalized anxiety disorder: Secondary | ICD-10-CM

## 2018-06-15 NOTE — Chronic Care Management (AMB) (Signed)
Chronic Care Management    Clinical Social Work General Follow Up Note  06/15/2018 Name: [name] MRN: [id number] DOB: 09-10-1955  Referred by: PCP, Dr.Warren Hale for psychosocial assessment and counseling support  Review of patient status, including review of consultants reports, relevant laboratory and other test results, and collaboration with appropriate care team members and the patient's provider was performed as part of comprehensive patient evaluation and provision of chronic care management services.    Last CCM Appointment: 06/15/2018  Depression screen PHQ 2/9 04/11/2018  Decreased Interest 1  Down, Depressed, Hopeless 1  PHQ - 2 Score 2  Altered sleeping 0  Tired, decreased energy 1  Change in appetite 0  Feeling bad or failure about yourself  1  Trouble concentrating 1  Moving slowly or fidgety/restless 0  Suicidal thoughts 0  PHQ-9 Score 5  Difficult doing work/chores -  Some recent data might be hidden     GAD 7 : Generalized Anxiety Score 05/01/2016 03/31/2016  Nervous, Anxious, on Edge 1 3  Control/stop worrying 1 3  Worry too much - different things 1 3  Trouble relaxing 0 2  Restless 0 2  Easily annoyed or irritable 0 0  Afraid - awful might happen 0 0  Total GAD 7 Score 3 13  Anxiety Difficulty Not difficult at all Not difficult at all    [name] has discussed Social Determinants of Health with client.Client has literacy challenges, financial challenges, and difficulty occasionally with medication administration   Goals    . " I am having pain in my chest more often" (pt-stated)     Clinical Social Work Clinical Goal(s): Over next 72 hours, patient will collaborate with nurse case manager regarding management of chest pain  Interventions: . Asked RN Case Manager to speak with pt on the phone immediately at patient's report of chest pain . Referred to nurse case manager for nursing CCM services  Patient Self Care Activities:  . Patient self  administers medications including Ntg . Monitors chest pain and reports       . "I need to figure out what is going on with my heart" (pt-stated)     Nurse Case Manager Clinical Goal(s): Over the next 6 months patient will demonstrate self-management of chest pain and will have no ED for angina   Interventions:  . Reviewed provider's plan of care for angina with patient . Reviewed medications with patient . Asked patient to verbalize plan of care for his typical angina  Patient Self Care Activities:  . Patient monitors chest pain and reports  . Patient self administers medications   *See Past Updates for previous documentation      . "I need to know what this new medicine is in my pill pack" (pt-stated)     Current Barriers:  . illiteracy . Lack of family involvement  Nurse Case Manager Clinical Goal(s): Over the next two months patient will be able to identify what each of his medicines are used to treat   Interventions:  . Chart review completed . Collaborated with pharmacist at [facility] They verified that Dr [name] ordered Aspirin 325mg  to replace Aspirin 81mg .  . Discussed with Dr [name] and his main goal was to make sure that [name] was taking an aspirin daily. He did authorize the change back to 81mg  since Dr [name] recommended that dose . [name] advised to d/c aspirin 325 and to reorder Aspirin 81mg . They will make this change prior to next pill  pack delivery.  . Requested that the remind [name] of the change at the time of delivery . I will call [name] today to let him know that there is a change . Request that he bring his pill pack in with him to his CCM appointment in March  Patient Self Care Activities:  . Takes prepackaged medication on his own  Please see past updates related to this goal by clicking on the "Past Updates" button in the selected goal       . "I want to make sure that I know what all of the pills I am taking are for.  There are two new pills that I don't know what they're for." (pt-stated)     Current Barriers:  . Cognitive deficits . illiteracy  Nurse Case Manager Clinical Goal(s): Over the next 2 months patient will be able to identify each of his medications by site and know what they are used for.  Interventions:  . Reviewed current medication list and their prescribed dates . Crossreferenced the prescription dates with the office notes and provided an explanation to the patient . Advised that fexofenadine (Allegra) was prescribed in response to his request for something for his runny nose . Advised that Celebrex was prescribed in response to his complaints of joint pain. [name] with [facility] and had them remove the Celebrex from the pill package and provide it in a sepearate bottle so that [name] can take it daily as needed at his discretion. . Will collaborate with pharmacy to provide patient with a visual resource to tell what each pill does  Patient Self Care Activities:  . Self administers medications . Aware of the number of pills that he takes and what color they are . Will question someone if he thinks something is wrong   Please see past updates related to this goal by clicking on the "Past Updates" button in the selected goal       . "I've had a heart attack before and I don't want to have another one." (pt-stated)     Current Barriers:  . Cognitive deficits . Illiteracy . Dependence on others for transportation  Nurse Case Manager Clinical Goal(s): Over the next 2 months patient will demonstrate desire to improve his health as evidenced by reduced cigarette smoking and increased physical activity.  Interventions:   Reviewed medications with patient  Reiterated the effects of smoking on the heart and cardiovascular system and discussed smoking cessation strategies and encouraged him to quit  Reminded him to follow up with Dr [name] in 1 year  Increase  physical activity as tolerated with a goal of 150 minutes of moderate activity a week  Patient Self Care Activities:  . Self administers medications . Has the ability to exercise . He does some physical labor but not consistently . Aware of s/s of heart attack  Please see past updates related to this goal by clicking on the "Past Updates" button in the selected goal                       Goal : . : " I need to get through all this legal stuff " (pt-stated)       Clinical Goal(s):  Over the next 30 days, patient will affirm attendance to scheduled court hearing and verbalize understanding of any ongoing legal obligations.   Interventions:   [name] reviewed ongoing needs of client around legal issues of client   Confirmed  that patient remains in contact with his attorney  [name] talked with client about client's last court hearing appearance with attorney on 05/25/2018  [name] encouraged client to use [place] as needed for attendance at required court hearings.  Patient's Self Care Activities:  Client attends scheduled medical appointments  Client takes medications as prescribed.  Client communicates with [name] to discuss legal events of client and to discuss community resources needs of client.  Client communicates with RN CM to discuss nursing needs of client.     Plan:    [name] to call client in 2 weeks to discuss with client legal issues client is facing and to encourage client to work with attorney of client regarding client's legal issues  Client to call RN CM as needed regarding nursing issues  Client to call office of Dr. Livia Hale to report medical needs of client  Client to attend scheduled medical appointments.       Follow Up Plan: [name] to call client in 2 weeks to discuss with client legal issues faced by client and to encourage client to talk with attorney of client related to client's legal issues.   [name].Dorethea Strubel MSW,  [name] Licensed Clinical Social Worker [facility] Care Management [phone]

## 2018-06-15 NOTE — Patient Instructions (Addendum)
Licensed Clinical Water engineer Provided:  No  Goals we discussed today:                      Goal :. : " I need to get through all this legal stuff " (pt-stated)       Clinical Goal(s): Over the next 30 days, patient will affirm attendance to scheduled court hearing and verbalize understanding of any ongoing legal obligations.   Interventions:   [name] reviewed ongoing needsof clientaround legal issues of client  Confirmed that patient remains in contact with his attorney  [name] talked with client about client's last court hearing appearance with attorney on 05/25/2018  [name] encouraged client to use [place] as needed for attendance at required court hearings.  Patient's Self Care Activities:  Client attends scheduled medical appointments  Client takes medications as prescribed.  Client communicates with [name] to discuss legal events of client and to discuss community resources needs of client.  Client communicates with RN CM to discuss nursing needs of client.   Plan:   [name] to call client in 2 weeks to discuss with client legal issues client is facing and to encourage client to work with attorney of client regarding client's legal issues  Client to call RN CM as needed regarding nursing issues  Client to call office of Dr. Livia Snellen to report medical needs of client  Client to attend scheduled medical appointments.       Follow Up Plan:[name] to call client in 2 weeks to discuss with client legal issues faced by client and to encourage client to talk with attorney of client related to client's legal issues.  The patient verbalized understanding of instructions provided today and declined a print copy of patient instruction materials.   [name].Peightyn Roberson MSW, [name] Licensed Clinical Social Worker [facility] [phone]

## 2018-06-20 ENCOUNTER — Ambulatory Visit (INDEPENDENT_AMBULATORY_CARE_PROVIDER_SITE_OTHER): Payer: Medicare Other | Admitting: *Deleted

## 2018-06-20 DIAGNOSIS — E785 Hyperlipidemia, unspecified: Secondary | ICD-10-CM

## 2018-06-20 DIAGNOSIS — I209 Angina pectoris, unspecified: Secondary | ICD-10-CM | POA: Diagnosis not present

## 2018-06-20 DIAGNOSIS — I10 Essential (primary) hypertension: Secondary | ICD-10-CM | POA: Diagnosis not present

## 2018-06-20 DIAGNOSIS — J441 Chronic obstructive pulmonary disease with (acute) exacerbation: Secondary | ICD-10-CM | POA: Diagnosis not present

## 2018-06-20 DIAGNOSIS — I25118 Atherosclerotic heart disease of native coronary artery with other forms of angina pectoris: Secondary | ICD-10-CM | POA: Diagnosis not present

## 2018-06-20 DIAGNOSIS — I252 Old myocardial infarction: Secondary | ICD-10-CM | POA: Diagnosis not present

## 2018-06-20 MED ORDER — AMOXICILLIN-POT CLAVULANATE 875-125 MG PO TABS: 1.0000 | ORAL_TABLET | Freq: Two times a day (BID) | ORAL | 0 refills | Status: DC

## 2018-06-20 MED ORDER — ASPIRIN EC 81 MG PO TBEC: 81.0000 mg | DELAYED_RELEASE_TABLET | Freq: Every day | ORAL | 11 refills | Status: DC

## 2018-06-20 NOTE — Chronic Care Management (AMB) (Signed)
Chronic Care Management   Initial Visit Note  06/20/2018 Name: [name] MRN: [id number] DOB: 07-26-1955  Referred by: [name], MD Reason for visit : Chronic Care Management Initial face to face visit for assessment and goal planning related to [name]'s self management of chronic medical conditions.   SUBJECTIVE:  [name] stated that every morning about two hours after taking his medications, he gets really tired and feels like he needs a nap. He feels better after he lays down for an hour or so. He is concerned about his heart health and is working towards improvement. He stopped smoking two weeks ago without any help and he would like to stay smoke free. He is also wanting to work on weight loss and needs help with his diet and exercise. He also wants to make sure that this blood pressure is under control.  Acutely he has symptoms of sinusitis. He stated, "I've been sick with this cold for a week now. My nose is stuffy and my head hurts. I took some Nyquil and it knocked me out in about 10 minutes but didn't really help." He is also using Flonase and taking Allegra daily as prescribed.   History: Past Medical History:  Diagnosis Date  . Cerebral palsy ([place])   . Coronary artery disease    LAD 70% stenosis, cath 2019  . Depression   . GERD (gastroesophageal reflux disease)   . Hypertension   . Scoliosis   . Seizures ([place])    pt's sister states that patinet has had seizures in the past, pt camr for PAT by himself on last visit and she stst "they misunderstood what he was trying to say. Pt saw neurologist in March who did CT and states patient does not have seizures. On no meds, had seizures as child.   Social History   Socioeconomic History  . Marital status: Single    Spouse name: Not on file  . Number of children: Not on file  . Years of education: 99  . Highest education level: 12th grade   Occupational History  . Occupation: Disabled   Social Needs  . Financial  resource strain: Not very hard  . Food insecurity:    Worry: Sometimes true    Inability: Sometimes true  . Transportation needs:    Medical: Yes-Has transportation arranged by [facility]    Non-medical: Yes-Oldest sister helps with some of his transportation   Tobacco Use  . Smoking status: Former Smoker    Packs/day: 2.00    Years: 41.00    Pack years: 82.00    Types: Cigarettes    Last attempt to quit: 06/06/2018    Years since quitting: 0.0  . Smokeless tobacco: Never Used  . Tobacco comment: Patient quit on his own two weeks ago around 06/06/18   Substance and Sexual Activity  . Alcohol use: Yes    Comment: "once in a blue moon" typically drinks beer  . Drug use: No  . Sexual activity: Never    Birth control/protection: None   Lifestyle  . Physical activity:    Days per week: 0 days    Minutes per session: 0 min  . Stress: To some extent   Relationships  . Social connections:    Talks on phone: More than three times a week with his oldest sister    Gets together: More than three times a week with his oldest sister    Attends religious service: Attends regularly with his friend, [name].  Active member of club or organization: No    Attends meetings of clubs or organizations: Never    Relationship status: Never married   Social History Narrative   Patient lives alone in a trailer. He has a sister that helps provide care for him. He is able to perform most ADLs but is illiterate.    Clinical Intake: Pain Score: 0-No pain  How often do you need to have someone help you when you read instructions, pamphlets, or other written materials from your doctor or pharmacy?: 4 - Often What is the last grade level you completed in school?: 12    Activities of Daily Living In your present state of health, do you have any difficulty performing the following activities: 06/20/2018  Hearing? N  Vision? N  Difficulty concentrating or making decisions? N  Comment cognitive delays due to  CP  Walking or climbing stairs? Y  Comment Has some trouble with walking > 260ft due to left leg weakness  Dressing or bathing? Y  Comment some physical limitations due to CP. Has assistance with bathing.   Doing errands, shopping? Y  Comment -  Preparing Food and eating ? N  Using the Toilet? N  In the past six months, have you accidently leaked urine? N  Do you have problems with loss of bowel control? N  Managing your Medications? N  Comment Prepackaged at the pharmacy  Managing your Finances? N  Comment patient states that he pays his own bills and keeps up with finances  Housekeeping or managing your Housekeeping? Y  Comment Has an aid that comes in two days a week to help with cleaning    Exercise Current Exercise Habits: The patient does not participate in regular exercise at present, Exercise limited by: neurologic condition(s);orthopedic condition(s). He is physically active while working around the house and helping people with tasks.    Depression Screen PHQ 2/9 Scores 04/11/2018 03/28/2018 02/22/2018 02/15/2018 02/07/2018 01/05/2018 11/25/2017  PHQ - 2 Score 2 0 0 0 0 0 0  PHQ- 9 Score 5 - - - - - -   *Under care of [name]  Fall Risk Fall Risk  06/20/2018 05/04/2018 05/02/2018 03/28/2018 02/22/2018  Falls in the past year? 0 0 0 0 0  Number falls in past yr: 0 - - - 0  Injury with Fall? 0 - - - -  Risk for fall due to : Medication side effect - Medication side effect - -  Follow up Falls prevention discussed - Falls prevention discussed - -    OBJECTIVE: Limited URI Physical Exam: Frontal facial pain/tenderness on palpation. Negative for maxillary facial pain/tenderness. Patient sounds like he has nasal congestion. Lungs clear to auscultation throughout. No wheezing or diminished breath sounds. Further exam deferred.  BP Readings from Last 3 Encounters:  05/04/18 131/75  04/27/18 120/80  03/28/18 123/70   Wt Readings from Last 3 Encounters:  05/04/18 232 lb 4 oz (105.3  kg)  04/27/18 232 lb (105.2 kg)  03/28/18 224 lb (101.6 kg)   BMI Readings from Last 3 Encounters:  05/04/18 31.50 kg/m  04/27/18 31.46 kg/m  03/28/18 32.14 kg/m    Medication List    acetaminophen 325 MG tablet Commonly known as:  TYLENOL Take 2 tablets (650 mg total) by mouth every 6 (six) hours as needed for mild pain or headache.   albuterol 108 (90 Base) MCG/ACT inhaler Commonly known as:  PROAIR HFA 1-2 puffs every 6 hours as needed wheezing or shortness of  breath.   amoxicillin-clavulanate 875-125 MG tablet Commonly known as:  AUGMENTIN Take 1 tablet by mouth 2 (two) times daily with a meal. Prescribed today for acute sinusitis   aspirin EC 81 MG tablet Take 1 tablet (81 mg total) by mouth daily. OTC originally prescribed by Dr Percival Spanish but was not ordered through [facility]. During previous telephone visit I consulted with [facility] and they are now including it in his pill pack.  atorvastatin 40 MG tablet Commonly known as:  LIPITOR Take 1 tablet (40 mg total) by mouth daily at 6 PM.   buPROPion 150 MG 12 hr tablet Commonly known as:  WELLBUTRIN SR Take 1 tablet (150 mg total) by mouth 2 (two) times daily.   celecoxib 200 MG capsule Commonly known as:  CELEBREX Take 200 mg by mouth daily. Takes as needed. Not included in prepackaged pill pack from [facility]   DULoxetine 30 MG capsule Commonly known as:  CYMBALTA Take 2 capsules (60 mg total) by mouth daily. WITH A FULL STOMACH AT SUPPER TIME   fexofenadine 180 MG tablet Commonly known as:  ALLEGRA Take 1 tablet (180 mg total) by mouth daily. For allergy symptoms   fluticasone 50 MCG/ACT nasal spray Commonly known as:  FLONASE Place 2 sprays into both nostrils daily.   fluticasone furoate-vilanterol 100-25 MCG/INH Aepb Commonly known as:  BREO ELLIPTA Inhale 1 puff into the lungs daily.   isosorbide mononitrate 30 MG 24 hr tablet Commonly known as:  IMDUR Take 1 tablet (30 mg  total) by mouth daily.   lisinopril 20 MG tablet Commonly known as:  PRINIVIL,ZESTRIL Take 1 tablet (20 mg total) by mouth daily.   metoprolol tartrate 25 MG tablet Commonly known as:  LOPRESSOR Take 1 tablet (25 mg total) by mouth 2 (two) times daily. *May possible be making him drowsy. Plan to consult with Dr Percival Spanish to see if he thinks switching to metoprolol succinate 50mg  qhs is appropriate and may be helpful.    nitroGLYCERIN 0.4 MG SL tablet Commonly known as:  NITROSTAT Place 1 tablet (0.4 mg total) under the tongue every 5 (five) minutes x 3 doses as needed for chest pain. Keeps these with him but has not had to take them over the past month.   pantoprazole 40 MG tablet Commonly known as:  PROTONIX Take 1 tablet (40 mg total) by mouth 2 (two) times daily.   tamsulosin 0.4 MG Caps capsule Commonly known as:  FLOMAX TAKE (1) CAPSULE DAILY        # ED visits/last 6 months:  0 # IP Hospitalizations/ last 6 months:0  Patient Care Team: [name], MD as PCP - General (Family Medicine) Minus Breeding, MD as PCP - Cardiology (Cardiology) Kearney, Amber as [place] Management Forrest, [name], [name] as Education officer, museum (Licensed Clinical Social Worker) Cori Razor, Delice Bison, RN as Case Manager   ASSESSMENT AND PLAN: JOACHIM CARTON is a 63 y.o. year old male who is a primary care patient of [name], [name], MD. The CCM team was consulted for assistance with chronic disease management and care coordination needs related to:  Infantile Cerebral Palsy with cognitive deficits, GAD, illiteracy, apraxia of speech, angina, hx of MI, CAD, dyslipidemia  Review of patient status, including review of consultants reports, relevant laboratory and other test results, and collaboration with appropriate care team members and the patient's provider was performed as part of comprehensive patient evaluation and provision of chronic care management services.  Goals Addressed     . "I feel really sleepy a couple hours after I get up and take my medicine" (pt-stated)        Current Barriers:  [name] Knowledge Deficits related to medication side effects . Lacks caregiver support.  . Literacy barriers . Transportation barriers  Nurse Case Manager Clinical Goal(s):  [name] Over the next 14 days, patient will meet with RN Care Manager to address cause of drowsiness.  Interventions:  . Advised patient to check blood pressures twice weekly and as needed. . Reviewed medications with patient and discussed hypertensive meds. . Collaboration planned with Dr Percival Spanish regarding beta blocker dosage and directions.   Patient Self Care Activities:  . Currently UNABLE TO independently drive and perform all ADLs*  Plan:  . Patient will check blood pressure and record readings twice a week and as needed . RNCM will collaborate with Dr Percival Spanish regarding dose and timing of beta blocker.   [name] RNCM will follow up with patient by phone over the next 14 days   Initial goal documentation 06/20/2018     . "I need to figure out what is going on with my heart" (pt-stated)        Nurse Case Manager Clinical Goal(s): Over the next 6 months patient will demonstrate self-management of chest pain and will have no ED for angina   Interventions:   Questioned patient about frequency and quality of chest pain since last visit . Reviewed provider's plan of care for angina with patient . Reviewed medications with patient . Explained that Imdur was prescribed at his last visit with Dr Percival Spanish to help control frequent angina . Asked patient to verbalize plan of care for his typical angina  Patient Self Care Activities:  . Patient monitors chest pain and reports  . Patient self administers medications . Patient calls the office with questions or concerns   Plan: CCM team will follow up with patient over the next 30 day Patient will call RNCM with questions or concerns Patient will call 911  with any chest pain that is not relieved with nitroglycerin  Patient will call 911 with any chest pain that requires 3 nitroglycerin tablets Patient will keep scheduled follow up appts with PCP and cardio   *See Past Updates for previous documentation 06/20/2018     . "I quit smoking 2 weeks ago and I want to make sure that I don't start back" (pt-stated)       Current Barriers:  [name] Knowledge Deficits related to smoking cessation resources . Lacks caregiver support.  Lauralyn Primes barriers  Nurse Case Manager Clinical Goal(s):  [name] Over the next 14 days days, patient will verbalize understanding of plan for smoking cessation  . Over the next 14 days, patient will be able to explain two benefits that smoking cessation will have on his health . Over the next 30 days patient will not smoke cigarettes  Interventions:  . Evaluation of current treatment plan related to smoking cessation and patient's adherence to plan as established by provider.  . Verbal education provided on negative effects of smoking and the positive health benefits of smoking cessation . Verbal education provided on the physical changes he should expect since he has stopped smoking . Congratulations and encouragement provided . Discussed OTC smoking cessation tools  Patient Self Care Activities:  . Currently UNABLE TO independently perform all ADLs or drive* . Self administers medications as prescribed . Attends church or other social activities . Calls provider office  for new concerns or questions  Plan:  . Patient will reach out to me or [name] if doesn't feel like he might smoke . RNCM will follow up by telephone over the next 14 days   Initial goal documentation 06/20/2018      . "I want to lose some of this weight" (pt-stated)        Current Barriers:  . Literacy barriers  . Knowledge deficit related to proper weight management techniques  Nurse Case Manager Clinical Goal(s):  [name] Over the next 14 days, patient  will verbalize understanding of plan for weight loss  . Over the next 14 days patient will state understanding of need for weight management and it's health benefits . Over the next 30 days patient will experience a weight loss of 4 to 8 pounds.    Interventions:  . Provided patient with verbal and written educational materials related to proper diet and weight management   Patient Self Care Activities:  . Currently UNABLE TO independently perform all ADLs independently . Self administers medications as prescribed (in pill pack) . Calls provider office for new concerns or questions  Plan:  . Patient will follow prescribed plan and f/u if needed . RNCM will follow up by telephone over the next 14 days.    Initial goal documentation 06/20/2018      . "I want to make sure my blood pressure stays under control" (pt-stated)        Current Barriers:  [name] Knowledge Deficits related to blood pressure readings and management . Lacks caregiver support.   Nurse Case Manager Clinical Goal(s):  [name] Over the next 14 days, patient will verbalize understanding of plan for managing hypertension  Interventions:  . Evaluation of current treatment plan related to hypertension and patient's adherence to plan as established by provider. . Advised patient to check blood pressure twice weekly and as needed and record on the sheet that I provided.  . Reviewed medications with patient and discussed which medications are used to treat hypertension.   . Provided verbal and written instructions for patient/home aid to contact me if his blood pressure is greater than 150/90 . DASH diet  Patient Self Care Activities:  . Currently UNABLE TO independently completely bathe, drive, perform housekeeping duties*  . He is able to take his prepackaged medications on his own . Prepares his own meals  Plan:  . Patient will check his blood pressure 2 times a week and record the readings   . Report any readings above  140/90 . RNCM will provide sheet to record readings  Initial goal documentation 06/20/2018      . "I want to make sure that I get a ride to my next appointment with Dr Livia Snellen" (pt-stated)        Current Barriers:  [name] Knowledge Deficits related to ways to arrange transportation . Lacks caregiver support.  . Film/video editor.  . Literacy barriers . Transportation barriers  Nurse Case Manager Clinical Goal(s):  [name] Over the next 14 days, patient will verbalize understanding of plan for arranging transportation to his next visit with Dr Livia Snellen in April.  Interventions:  . Collaborated with [name], [name] regarding [facility] transportation arrangement services . Provided patient and/or caregiver with verbal information about transportation arrangement. He should expect a phone call from [employer] with information about his transportation for that visit.  Advice worker).  Patient Self Care Activities:  . Currently UNABLE TO independently all ADLs independently or drive. * . Self  administers medications as prescribed . Attends church or other social activities . Calls provider office for new concerns or questions  Plan:  . Patient will talk with transportation coordinator when she calls.  [name] RNCM will forward reminder to [name] regarding arrangements . [name] willreach out to [employer] with [facility] to request that she arrange transportation and contact [place] with the information.    Initial goal documentation 06/20/2018     . "I've had a cold for a week and I don't know what else to do for it" (pt-stated)       Current Barriers:  [name] Knowledge Deficits related to self management of sinusitis . Lacks caregiver support.  . Film/video editor.  . Literacy barriers . Transportation barriers  Nurse Case Manager Clinical Goal(s):  [name] Over the next 7 days patient will verbalize improvement or resolution of sinusitis symptoms.   Interventions:  . Advised patient to Take 1 tablet of Augmentin  twice daily with food and a glass of water. Continue to use Flonase and allegra for allergy symptoms and use saline nasal spray as often as needed. Stop taking Nyquil or any other multisymptom cold medications.  . Reviewed medications with patient and discussed frequent side effects of Augmentin.  [name] Collaborated with Dr Livia Snellen regarding frontal facial pain/pressure, headache, and nasal congestion x 1 week.   Patient Self Care Activities:  . Self administers medications as prescribed- [place] will deliver and patient is able to take medications on his after being given instructions.  . Calls for appointments and with problems  Plan:  . Patient will take augmentin as prescribed . Patient will call if symptoms worsen or persist . RNCM will follow up with patient via telephone over the next 7 days to assess improvement.    Initial goal documentation 06/20/2018     . "I've had a heart attack before and I don't want to have another one." (pt-stated)        Current Barriers:  . Cognitive deficits . Illiteracy . Dependence on others for transportation  Nurse Case Manager Clinical Goal(s): Over the next 2 months patient will demonstrate continued desire to improve his health as evidenced by following a heart healthy diet, continuing to be smoke free, and increasing his physical activity.  Interventions:   Reviewed medications with patient  Congratulated patient on 2 weeks of being smoke free and encouraged him to continue to abstain from cigarettes  Reminded him to follow up with Dr Percival Spanish in 1 year and PRN  Reviewed patient's current dietary habits  Education on heart healthy diet and portion sizes discussed  Increase physical activity as tolerated with a goal of 150 minutes of moderate activity a week   Patient Self Care Activities:  . Self administers medications . Has the ability to exercise . He does some physical labor but not consistently . Aware of s/s of heart  attack  Please see past updates related to this goal by clicking on the "Past Updates" button in the selected goal 06/20/2018          CCM Follow-up Plan RNCM will Follow up with patient via telephone over the next 14 days   [name], RN-BC, BSN Nurse Case Manager [place] [phone]

## 2018-06-20 NOTE — Patient Instructions (Addendum)
Visit Information  Goals Addressed            This Visit's Progress   . COMPLETED: " I am having pain in my chest more often" (pt-stated)       Clinical Social Work Clinical Goal(s): Over next 72 hours, patient will collaborate with nurse case manager regarding management of chest pain  Interventions: . Asked RN Case Manager to speak with pt on the phone immediately at patient's report of chest pain . Referred to nurse case manager for nursing CCM services  Patient Self Care Activities:  . Patient self administers medications including Ntg . Monitors chest pain and reports  Current issue resolved. No episodes of chest pain since last visit with cardiology and addition of ASA and Imdur. Will follow up as needed.  [name], RN-BC, BSN Nurse Case Manager [place] [phone]       . "I feel really sleepy a couple hours after I get up and take my medicine" (pt-stated)       Current Barriers:  [name] Knowledge Deficits related to medication side effects . Lacks caregiver support.  . Literacy barriers . Transportation barriers  Nurse Case Manager Clinical Goal(s):  [name] Over the next 14 days, patient will meet with RN Care Manager to address cause of drowsiness.  Interventions:  . Advised patient to check blood pressures twice weekly and as needed. . Reviewed medications with patient and discussed hypertensive meds. . Collaboration planned with Dr Percival Spanish regarding beta blocker dosage and directions.   Patient Self Care Activities:  . Currently UNABLE TO independently drive and perform all ADLs*  Plan:  . Patient will check blood pressure and record readings twice a week and as needed . RNCM will collaborate with Dr Percival Spanish regarding dose and timing of beta blocker.   [name] RNCM will follow up with patient by phone over the next 14 days   Initial goal documentation   [name], RN-BC, BSN Nurse Case Manager [place] [phone]        . "I want to lose some of this weight" (pt-stated)       Current Barriers:  . Literacy barriers  . Knowledge deficit related to proper weight management techniques  Nurse Case Manager Clinical Goal(s):  [name] Over the next 14 days, patient will verbalize understanding of plan for weight loss  . Over the next 14 days patient will state understanding of need for weight management and it's health benefits . Over the next 30 days patient will experience a weight loss of 4 to 8 pounds.    Interventions:  . Provided patient with verbal and written educational materials related to proper diet and weight management   Patient Self Care Activities:  . Currently UNABLE TO independently perform all ADLs independently . Self administers medications as prescribed (in pill pack) . Calls provider office for new concerns or questions  Plan:  . Patient will follow prescribed plan and f/u if needed . RNCM will follow up by telephone over the next 14 days.    Initial goal documentation   [name], RN-BC, BSN  Nurse Case Manager [place] [phone]        . "I want to make sure my blood pressure stays under control" (pt-stated)       Current Barriers:  [name] Knowledge Deficits related to blood pressure readings and management . Lacks caregiver support.   Nurse Case Manager Clinical Goal(s):  .  Over the next 14 days, patient will verbalize understanding of plan for managing hypertension  Interventions:  . Evaluation of current treatment plan related to hypertension and patient's adherence to plan as established by provider.  Patient Self Care Activities:  . Currently UNABLE TO independently completely bathe, drive, perform housekeeping duties*  . He is able to take his prepackaged medications on his own . Prepares his own meals  Plan:  . Patient will check his blood pressure 2 times a week and record the readings   . Report  any readings above 140/90 . RNCM will provide sheet to record readings  Initial goal documentation   [name], RN-BC, BSN Nurse Case Manager [place] [phone]            Education or Materials Provided:  . Verbal education about diet, exercise, and weight management provided face to face  . Handout provided on portion sizes and exercise . Verbal and written materials provided on Blood pressure parameters . Verbal education provided on URI symptoms management:  o Use saline nasal spray often during the day o Keep using Flonase o Continue Keep using Allegra o STOP taking Nyquil o Use a cool mist humidifier o Take Augmentin as prescribed  Print copy of patient instructions provided.   Plan RNCM will reach out to the patient by telephone over the next 14 days.  Augmentin 875mg  1 tablet twice a day for 10 days If your symptoms worsen or do not resolve please call us and let us know   [name], RN-BC, BSN Nurse Case Manager [place] [phone]

## 2018-06-21 ENCOUNTER — Encounter: Payer: Medicare Other | Admitting: *Deleted

## 2018-06-21 ENCOUNTER — Ambulatory Visit: Payer: Medicare Other | Admitting: *Deleted

## 2018-06-21 DIAGNOSIS — G809 Cerebral palsy, unspecified: Secondary | ICD-10-CM

## 2018-06-21 DIAGNOSIS — R531 Weakness: Secondary | ICD-10-CM

## 2018-06-21 DIAGNOSIS — I25118 Atherosclerotic heart disease of native coronary artery with other forms of angina pectoris: Secondary | ICD-10-CM | POA: Diagnosis not present

## 2018-06-21 DIAGNOSIS — I10 Essential (primary) hypertension: Secondary | ICD-10-CM | POA: Diagnosis not present

## 2018-06-21 DIAGNOSIS — E785 Hyperlipidemia, unspecified: Secondary | ICD-10-CM | POA: Diagnosis not present

## 2018-06-21 NOTE — Patient Instructions (Signed)
  Follow up plan: RNCM will complete [employer] form and have PCP review and sign RNCM will fax to [employer] at [phone]  Handicap parking form to be completed, signed, and mailed to patient  The patient verbalized understanding of instructions provided today and declined a print copy of patient instruction materials.    [name], RN-BC, BSN Nurse Case Manager [place] [phone]

## 2018-06-21 NOTE — Chronic Care Management (AMB) (Signed)
  Chronic Care Management   Face to Face Note  06/21/2018 Name: [name] MRN: [id number] DOB: [birth year]  [name] came back by the office today to drop off a form from [facility] for [place] to complete so that he can apply for additional in-home services. He would also like to have a handicap parking form filled out. Form reviewed with patient.   Follow up plan: RNCM will complete [employer] form and have PCP review and sign RNCM will fax to [employer] at [phone]  Handicap parking form to be completed, signed, and mailed to patient   [name], RN-BC, BSN Nurse Case Manager [place] [phone]

## 2018-06-23 ENCOUNTER — Telehealth: Payer: Self-pay | Admitting: Family Medicine

## 2018-06-24 ENCOUNTER — Telehealth: Payer: Medicare Other | Admitting: *Deleted

## 2018-06-27 ENCOUNTER — Emergency Department (HOSPITAL_COMMUNITY): Payer: Medicare Other

## 2018-06-27 ENCOUNTER — Encounter (HOSPITAL_COMMUNITY): Payer: Self-pay | Admitting: Student

## 2018-06-27 ENCOUNTER — Telehealth: Payer: Medicare Other | Admitting: *Deleted

## 2018-06-27 ENCOUNTER — Inpatient Hospital Stay (HOSPITAL_COMMUNITY)
Admission: EM | Admit: 2018-06-27 | Discharge: 2018-06-30 | DRG: 202 | Disposition: A | Discharge status: Home Health | Payer: Medicare Other | Attending: Internal Medicine | Admitting: Internal Medicine

## 2018-06-27 ENCOUNTER — Other Ambulatory Visit: Payer: Self-pay

## 2018-06-27 DIAGNOSIS — N400 Enlarged prostate without lower urinary tract symptoms: Secondary | ICD-10-CM | POA: Diagnosis present

## 2018-06-27 DIAGNOSIS — I10 Essential (primary) hypertension: Secondary | ICD-10-CM | POA: Diagnosis present

## 2018-06-27 DIAGNOSIS — Z8249 Family history of ischemic heart disease and other diseases of the circulatory system: Secondary | ICD-10-CM | POA: Diagnosis not present

## 2018-06-27 DIAGNOSIS — Z833 Family history of diabetes mellitus: Secondary | ICD-10-CM

## 2018-06-27 DIAGNOSIS — R0902 Hypoxemia: Secondary | ICD-10-CM | POA: Diagnosis not present

## 2018-06-27 DIAGNOSIS — Z7982 Long term (current) use of aspirin: Secondary | ICD-10-CM | POA: Diagnosis not present

## 2018-06-27 DIAGNOSIS — K219 Gastro-esophageal reflux disease without esophagitis: Secondary | ICD-10-CM | POA: Diagnosis present

## 2018-06-27 DIAGNOSIS — R0602 Shortness of breath: Secondary | ICD-10-CM | POA: Diagnosis not present

## 2018-06-27 DIAGNOSIS — E876 Hypokalemia: Secondary | ICD-10-CM | POA: Diagnosis present

## 2018-06-27 DIAGNOSIS — J440 Chronic obstructive pulmonary disease with acute lower respiratory infection: Secondary | ICD-10-CM | POA: Diagnosis present

## 2018-06-27 DIAGNOSIS — J209 Acute bronchitis, unspecified: Principal | ICD-10-CM | POA: Diagnosis present

## 2018-06-27 DIAGNOSIS — I252 Old myocardial infarction: Secondary | ICD-10-CM | POA: Diagnosis not present

## 2018-06-27 DIAGNOSIS — R062 Wheezing: Secondary | ICD-10-CM | POA: Diagnosis not present

## 2018-06-27 DIAGNOSIS — Z7951 Long term (current) use of inhaled steroids: Secondary | ICD-10-CM | POA: Diagnosis not present

## 2018-06-27 DIAGNOSIS — J441 Chronic obstructive pulmonary disease with (acute) exacerbation: Secondary | ICD-10-CM

## 2018-06-27 DIAGNOSIS — Z811 Family history of alcohol abuse and dependence: Secondary | ICD-10-CM | POA: Diagnosis not present

## 2018-06-27 DIAGNOSIS — F329 Major depressive disorder, single episode, unspecified: Secondary | ICD-10-CM | POA: Diagnosis present

## 2018-06-27 DIAGNOSIS — Z888 Allergy status to other drugs, medicaments and biological substances status: Secondary | ICD-10-CM

## 2018-06-27 DIAGNOSIS — Z87891 Personal history of nicotine dependence: Secondary | ICD-10-CM | POA: Diagnosis not present

## 2018-06-27 DIAGNOSIS — I2510 Atherosclerotic heart disease of native coronary artery without angina pectoris: Secondary | ICD-10-CM | POA: Diagnosis present

## 2018-06-27 DIAGNOSIS — G809 Cerebral palsy, unspecified: Secondary | ICD-10-CM | POA: Diagnosis not present

## 2018-06-27 DIAGNOSIS — M419 Scoliosis, unspecified: Secondary | ICD-10-CM | POA: Diagnosis not present

## 2018-06-27 DIAGNOSIS — I209 Angina pectoris, unspecified: Secondary | ICD-10-CM | POA: Diagnosis not present

## 2018-06-27 DIAGNOSIS — E785 Hyperlipidemia, unspecified: Secondary | ICD-10-CM | POA: Diagnosis present

## 2018-06-27 DIAGNOSIS — R0689 Other abnormalities of breathing: Secondary | ICD-10-CM | POA: Diagnosis not present

## 2018-06-27 DIAGNOSIS — I25118 Atherosclerotic heart disease of native coronary artery with other forms of angina pectoris: Secondary | ICD-10-CM | POA: Diagnosis present

## 2018-06-27 DIAGNOSIS — R069 Unspecified abnormalities of breathing: Secondary | ICD-10-CM | POA: Diagnosis not present

## 2018-06-27 LAB — URINALYSIS, ROUTINE W REFLEX MICROSCOPIC
Bacteria, UA: NONE SEEN
Bilirubin Urine: NEGATIVE
Glucose, UA: NEGATIVE mg/dL
Hgb urine dipstick: NEGATIVE
Ketones, ur: NEGATIVE mg/dL
Leukocytes,Ua: NEGATIVE
Nitrite: NEGATIVE
Protein, ur: 100 mg/dL — AB
Specific Gravity, Urine: 1.017 (ref 1.005–1.030)
pH: 5 (ref 5.0–8.0)

## 2018-06-27 LAB — INFLUENZA PANEL BY PCR (TYPE A & B)
Influenza A By PCR: NEGATIVE
Influenza B By PCR: NEGATIVE

## 2018-06-27 LAB — COMPREHENSIVE METABOLIC PANEL
ALK PHOS: 67 U/L (ref 38–126)
ALT: 21 U/L (ref 0–44)
ANION GAP: 12 (ref 5–15)
AST: 32 U/L (ref 15–41)
Albumin: 3.9 g/dL (ref 3.5–5.0)
BUN: 15 mg/dL (ref 6–20)
CALCIUM: 8.4 mg/dL — AB (ref 8.9–10.3)
CO2: 30 mmol/L (ref 22–32)
Chloride: 94 mmol/L — ABNORMAL LOW (ref 98–111)
Creatinine, Ser: 0.89 mg/dL (ref 0.61–1.24)
GFR calc Af Amer: >60 mL/min (ref >60)
GFR calc non Af Amer: >60 mL/min (ref >60)
Glucose, Bld: 110 mg/dL — ABNORMAL HIGH (ref 70–99)
Potassium: 3.2 mmol/L — ABNORMAL LOW (ref 3.5–5.1)
Sodium: 136 mmol/L (ref 135–145)
Total Bilirubin: 1 mg/dL (ref 0.3–1.2)
Total Protein: 7.5 g/dL (ref 6.5–8.1)

## 2018-06-27 LAB — CBC WITH DIFFERENTIAL/PLATELET
Abs Immature Granulocytes: 0.03 10*3/uL (ref 0.00–0.07)
Basophils Absolute: 0 10*3/uL (ref 0.0–0.1)
Basophils Relative: 0 %
EOS ABS: 0.2 10*3/uL (ref 0.0–0.5)
Eosinophils Relative: 3 %
HCT: 46.7 % (ref 39.0–52.0)
Hemoglobin: 16.3 g/dL (ref 13.0–17.0)
IMMATURE GRANULOCYTES: 0 %
Lymphocytes Relative: 36 %
Lymphs Abs: 2.7 10*3/uL (ref 0.7–4.0)
MCH: 34 pg (ref 26.0–34.0)
MCHC: 34.9 g/dL (ref 30.0–36.0)
MCV: 97.3 fL (ref 80.0–100.0)
Monocytes Absolute: 0.3 10*3/uL (ref 0.1–1.0)
Monocytes Relative: 3 %
Neutro Abs: 4.3 10*3/uL (ref 1.7–7.7)
Neutrophils Relative %: 58 %
Platelets: 168 10*3/uL (ref 150–400)
RBC: 4.8 MIL/uL (ref 4.22–5.81)
RDW: 13 % (ref 11.5–15.5)
WBC: 7.5 10*3/uL (ref 4.0–10.5)
nRBC: 0 % (ref 0.0–0.2)

## 2018-06-27 LAB — LACTIC ACID, PLASMA
Lactic Acid, Venous: 1.7 mmol/L (ref 0.5–1.9)
Lactic Acid, Venous: 1.3 mmol/L (ref 0.5–1.9)

## 2018-06-27 LAB — MAGNESIUM: Magnesium: 1.9 mg/dL (ref 1.7–2.4)

## 2018-06-27 MED ORDER — LEVOFLOXACIN IN D5W 500 MG/100ML IV SOLN
500.0000 mg | INTRAVENOUS | Status: DC
  Administered 2018-06-28 – 2018-06-29 (×2): 500 mg via INTRAVENOUS
  Filled 2018-06-27 (×2): qty 100

## 2018-06-27 MED ORDER — ENOXAPARIN SODIUM 40 MG/0.4ML ~~LOC~~ SOLN
40.0000 mg | SUBCUTANEOUS | Status: DC
  Administered 2018-06-28 – 2018-06-30 (×3): 40 mg via SUBCUTANEOUS
  Filled 2018-06-27 (×3): qty 0.4

## 2018-06-27 MED ORDER — POTASSIUM CHLORIDE CRYS ER 20 MEQ PO TBCR
40.0000 meq | EXTENDED_RELEASE_TABLET | Freq: Once | ORAL | Status: AC
  Administered 2018-06-27: 40 meq via ORAL
  Filled 2018-06-27: qty 2

## 2018-06-27 MED ORDER — MAGNESIUM SULFATE 2 GM/50ML IV SOLN
2.0000 g | Freq: Once | INTRAVENOUS | Status: AC
  Administered 2018-06-27: 2 g via INTRAVENOUS
  Filled 2018-06-27: qty 50

## 2018-06-27 MED ORDER — LACTATED RINGERS IV SOLN
INTRAVENOUS | Status: DC
  Administered 2018-06-27 – 2018-06-28 (×4): via INTRAVENOUS

## 2018-06-27 MED ORDER — METHYLPREDNISOLONE SODIUM SUCC 40 MG IJ SOLR
40.0000 mg | Freq: Four times a day (QID) | INTRAMUSCULAR | Status: DC
  Administered 2018-06-27 – 2018-06-29 (×7): 40 mg via INTRAVENOUS
  Filled 2018-06-27 (×7): qty 1

## 2018-06-27 MED ORDER — METOPROLOL TARTRATE 25 MG PO TABS
25.0000 mg | ORAL_TABLET | Freq: Two times a day (BID) | ORAL | Status: DC
  Administered 2018-06-28 – 2018-06-30 (×5): 25 mg via ORAL
  Filled 2018-06-27 (×5): qty 1

## 2018-06-27 MED ORDER — IPRATROPIUM-ALBUTEROL 0.5-2.5 (3) MG/3ML IN SOLN
3.0000 mL | Freq: Four times a day (QID) | RESPIRATORY_TRACT | Status: DC
  Administered 2018-06-27 – 2018-06-29 (×7): 3 mL via RESPIRATORY_TRACT
  Filled 2018-06-27 (×7): qty 3

## 2018-06-27 MED ORDER — ACETAMINOPHEN 325 MG PO TABS
650.0000 mg | ORAL_TABLET | Freq: Four times a day (QID) | ORAL | Status: DC | PRN
  Filled 2018-06-27: qty 2

## 2018-06-27 MED ORDER — DULOXETINE HCL 60 MG PO CPEP
60.0000 mg | ORAL_CAPSULE | Freq: Every day | ORAL | Status: DC
  Administered 2018-06-28 – 2018-06-30 (×3): 60 mg via ORAL
  Filled 2018-06-27 (×3): qty 1

## 2018-06-27 MED ORDER — ONDANSETRON HCL 4 MG PO TABS: 4.0000 mg | ORAL_TABLET | Freq: Four times a day (QID) | ORAL | Status: DC | PRN

## 2018-06-27 MED ORDER — GUAIFENESIN ER 600 MG PO TB12
600.0000 mg | ORAL_TABLET | Freq: Two times a day (BID) | ORAL | Status: DC
  Administered 2018-06-27 – 2018-06-30 (×6): 600 mg via ORAL
  Filled 2018-06-27 (×6): qty 1

## 2018-06-27 MED ORDER — ATORVASTATIN CALCIUM 40 MG PO TABS
40.0000 mg | ORAL_TABLET | Freq: Every day | ORAL | Status: DC
  Administered 2018-06-27 – 2018-06-29 (×3): 40 mg via ORAL
  Filled 2018-06-27 (×3): qty 1

## 2018-06-27 MED ORDER — PANTOPRAZOLE SODIUM 40 MG PO TBEC
40.0000 mg | DELAYED_RELEASE_TABLET | Freq: Two times a day (BID) | ORAL | Status: DC
  Administered 2018-06-27 – 2018-06-30 (×6): 40 mg via ORAL
  Filled 2018-06-27 (×6): qty 1

## 2018-06-27 MED ORDER — ONDANSETRON HCL 4 MG/2ML IJ SOLN: 4.0000 mg | Freq: Four times a day (QID) | INTRAMUSCULAR | Status: DC | PRN

## 2018-06-27 MED ORDER — SODIUM CHLORIDE 0.9 % IV BOLUS (SEPSIS)
2000.0000 mL | Freq: Once | INTRAVENOUS | Status: AC
  Administered 2018-06-27: 2000 mL via INTRAVENOUS

## 2018-06-27 MED ORDER — LEVOFLOXACIN IN D5W 500 MG/100ML IV SOLN
500.0000 mg | Freq: Once | INTRAVENOUS | Status: AC
  Administered 2018-06-27: 500 mg via INTRAVENOUS
  Filled 2018-06-27: qty 100

## 2018-06-27 MED ORDER — BUPROPION HCL ER (SR) 150 MG PO TB12
150.0000 mg | ORAL_TABLET | Freq: Two times a day (BID) | ORAL | Status: DC
  Administered 2018-06-27 – 2018-06-30 (×6): 150 mg via ORAL
  Filled 2018-06-27 (×10): qty 1

## 2018-06-27 MED ORDER — IPRATROPIUM-ALBUTEROL 0.5-2.5 (3) MG/3ML IN SOLN: 3.0000 mL | Freq: Four times a day (QID) | RESPIRATORY_TRACT | Status: DC

## 2018-06-27 MED ORDER — MENTHOL 3 MG MT LOZG: 1.0000 | LOZENGE | OROMUCOSAL | Status: DC | PRN

## 2018-06-27 MED ORDER — ACETAMINOPHEN 650 MG RE SUPP: 650.0000 mg | Freq: Four times a day (QID) | RECTAL | Status: DC | PRN

## 2018-06-27 MED ORDER — ALBUTEROL SULFATE (2.5 MG/3ML) 0.083% IN NEBU
2.5000 mg | INHALATION_SOLUTION | RESPIRATORY_TRACT | Status: DC | PRN
  Administered 2018-06-28: 2.5 mg via RESPIRATORY_TRACT
  Filled 2018-06-27: qty 3

## 2018-06-27 MED ORDER — ASPIRIN EC 81 MG PO TBEC
81.0000 mg | DELAYED_RELEASE_TABLET | Freq: Every day | ORAL | Status: DC
  Administered 2018-06-28 – 2018-06-30 (×3): 81 mg via ORAL
  Filled 2018-06-27 (×3): qty 1

## 2018-06-27 MED ORDER — IPRATROPIUM BROMIDE 0.02 % IN SOLN
0.5000 mg | Freq: Once | RESPIRATORY_TRACT | Status: AC
  Administered 2018-06-27: 0.5 mg via RESPIRATORY_TRACT
  Filled 2018-06-27: qty 2.5

## 2018-06-27 MED ORDER — IPRATROPIUM-ALBUTEROL 0.5-2.5 (3) MG/3ML IN SOLN
3.0000 mL | Freq: Once | RESPIRATORY_TRACT | Status: AC
  Administered 2018-06-27: 3 mL via RESPIRATORY_TRACT
  Filled 2018-06-27: qty 3

## 2018-06-27 NOTE — ED Notes (Signed)
Pt O2 was at 83% on room air, pt placed on 2L [place]

## 2018-06-27 NOTE — ED Triage Notes (Signed)
Pt arrived via [facility] with SOB and productive cough with white sputum, wheezing, given 5 mg albuterol, .5 of atrovent, 125 of solumedrol.

## 2018-06-27 NOTE — H&P (Signed)
History and Physical    PLEASE NOTE THAT DRAGON DICTATION SOFTWARE WAS USED IN THE CONSTRUCTION OF THIS NOTE.   [name] IAX:[id number] DOB: 1955-06-12 DOA: 06/27/2018  PCP: [name], MD Patient coming from: home  I have personally briefly reviewed patient's old medical records in [place]  Chief Complaint: cough  HPI: [name] is a 63 y.o. male with medical history significant for coronary artery disease status post left heart catheterization in March 2019 revealing 70% stenosis of proximal LAD, hypertension, tobacco abuse, allergic rhinitis, who is [facility]27[facility]2018 with acute bronchitis with bronchospasm after presenting from home to [name] ED complaining of cough.   The patient reports 1 week of productive cough associated with mild shortness of breath and wheezing, all of which she reports are new for him relative to baseline respiratory status.  He also notes subjective fever over that time in the absence of rigors or generalized myalgias.  Shortly after onset of the above, the patient completed a course of Augmentin prescribed by his PCP on 06/20/2018, but noted no significant improvement in the aforementioned symptoms following completion of this antibiotic.  The shortness of breath has not been associated with any orthopnea, PND, or peripheral edema. Of note, over the 3 to 4 days preceding the onset of productive cough and shortness of breath, the patient reports experiencing rhinitis and rhinorrhea.  Denies any known recent sick contacts, and denies any recent traveling.  He reports that he completely quit smoking approximately 3 weeks ago, after having smoked half pack per day for approximately 40 years.  He denies any known underlying chronic pulmonary conditions and reports that he has not previously undergone formal PFT evaluation.  Denies any baseline supplemental oxygen requirements, but is on Breo-Ellipta and prn albuteorl  as an outpatient.   Denies any associated headache, neck stiffness, rash, nausea, vomiting, diarrhea, or abdominal pain he also denies any associated chest pain, palpitations, or diaphoresis.      ED Course: Vital signs in the ED were notable for the following: Temperature max 98.2; heart rate 68-80; initial blood pressure 90/59, which improved to 107/68 following interval IV fluids, as further described below; respiratory rate 23-28; initial oxygen saturation 86% on room air, which improved to 94% on 2 L nasal cannula.  Labs performed in the ED were notable for the following: CMP notable for potassium 3.2, bicarbonate 30, creatinine 0.89.  CBC notable for white blood cell count of 7500 with 58% neutrophils.  Initial lactic acid noted to be 1.7, which decreased to 1.3 upon recheck following interval IV fluid administration.  Urinalysis showed no white blood cells, no bacteria, and was nitrite and leukocyte Estrace negative.  Influenza PCR was negative for influenza a and B.  Blood cultures x2 were collected prior to initiation of any antibiotics.  Two-view chest x-ray, per final radiology report showed no evidence of acute cardiopulmonary process, including no evidence of infiltrate, edema, or effusion.  While in the ED, the following were administered: Duo nebulizer treatment x1, Levaquin 500 mg IV x1, potassium chloride 40 mEq p.o. x1, magnesium sulfate 2 g IV over 2 hours, and a 2 L IV normal saline bolus.  Subsequently, the patient was admitted to the med telemetry floor for further evaluation and management of presenting acute bronchitis with bronchospasm and associated acute hypoxic respiratory distress.    Review of Systems: As per HPI otherwise 10 point review of systems negative.   Past  Medical History:  Diagnosis Date  . Cerebral palsy ([place])   . Coronary artery disease    LAD 70% stenosis, cath 2019  . Depression   . GERD (gastroesophageal reflux disease)   . Hypertension   .  Scoliosis   . Seizures ([place])    pt's sister states that patinet has had seizures in the past, pt camr for PAT by himself on last visit and she stst "they misunderstood what he was trying to say. Pt saw neurologist in March who did CT and states patient does not have seizures. On no meds, had seizures as child.    Past Surgical History:  Procedure Laterality Date  . CARPAL TUNNEL RELEASE Right 08/13/2016   Procedure: CARPAL TUNNEL RELEASE;  Surgeon: Carole Civil, MD;  Location: [facility];  Service: Orthopedics;  Laterality: Right;  . CORONARY ANGIOPLASTY WITH STENT PLACEMENT    . LEFT HEART CATH AND CORONARY ANGIOGRAPHY N/A 07/10/2017   Procedure: LEFT HEART CATH AND CORONARY ANGIOGRAPHY;  Surgeon: Troy Sine, MD;  Location: [place] CV LAB;  Service: Cardiovascular;  Laterality: N/A;    Social History:  reports that he quit smoking about 3 weeks ago. His smoking use included cigarettes. He has a 20.50 pack-year smoking history. He has never used smokeless tobacco. He reports current alcohol use. He reports that he does not use drugs.   Allergies  Allergen Reactions  . Chantix [Varenicline Tartrate] Nausea And Vomiting    Family History  Problem Relation Age of Onset  . CAD Father 24       CABG  . Heart disease Father   . CAD Brother   . Diabetes Mother   . Alcohol abuse Brother   . CAD Sister   . Heart failure Sister      Prior to Admission medications   Medication Sig Start Date End Date Taking? Authorizing Provider  aspirin 81 MG tablet Take 1 tablet (81 mg total) by mouth daily. 06/20/18  Yes [name], MD  atorvastatin (LIPITOR) 40 MG tablet Take 1 tablet (40 mg total) by mouth daily at 6 PM. 03/28/18  Yes [name], [name], MD  buPROPion [facility] SR) 150 MG 12 hr tablet Take 1 tablet (150 mg total) by mouth 2 (two) times daily. 03/28/18  Yes [name], [name], MD  DULoxetine (CYMBALTA) 30 MG capsule Take 2 capsules (60 mg total) by mouth daily. WITH A FULL STOMACH  AT SUPPER TIME 03/28/18  Yes [name], MD  isosorbide mononitrate (IMDUR) 30 MG 24 hr tablet Take 1 tablet (30 mg total) by mouth daily. 03/28/18  Yes [name], [name], MD  lisinopril (PRINIVIL,ZESTRIL) 20 MG tablet Take 1 tablet (20 mg total) by mouth daily. 03/28/18  Yes [name], MD  metoprolol tartrate (LOPRESSOR) 25 MG tablet Take 1 tablet (25 mg total) by mouth 2 (two) times daily. 03/28/18  Yes [name], MD  pantoprazole (PROTONIX) 40 MG tablet Take 1 tablet (40 mg total) by mouth 2 (two) times daily. 03/28/18  Yes [name], [name], MD  tamsulosin (FLOMAX) 0.4 MG CAPS capsule TAKE (1) CAPSULE DAILY Patient taking differently: Take 0.4 mg by mouth every morning. TAKE (1) CAPSULE DAILY 02/22/18  Yes [name], MD  acetaminophen (TYLENOL) 325 MG tablet Take 2 tablets (650 mg total) by mouth every 6 (six) hours as needed for mild pain or headache. 07/11/17   Erlene Quan, PA-C  albuterol (PROAIR HFA) 108 (90 Base) MCG/ACT inhaler 1-2 puffs every 6 hours as needed wheezing or shortness  of breath. 02/22/18   [name], MD  amoxicillin-clavulanate (AUGMENTIN) 875-125 MG tablet Take 1 tablet by mouth 2 (two) times daily with a meal. 06/20/18   [name], MD  celecoxib (CELEBREX) 200 MG capsule Take 200 mg by mouth daily.    [name], MD  fexofenadine (ALLEGRA) 180 MG tablet Take 1 tablet (180 mg total) by mouth daily. For allergy symptoms 04/05/18   [name], MD  fluticasone [facility]) 50 MCG/ACT nasal spray Place 2 sprays into both nostrils daily. 02/22/18   [name], MD  fluticasone furoate-vilanterol (BREO ELLIPTA) 100-25 MCG/INH AEPB Inhale 1 puff into the lungs daily. 05/04/18   [name], MD  nitroGLYCERIN (NITROSTAT) 0.4 MG SL tablet Place 1 tablet (0.4 mg total) under the tongue every 5 (five) minutes x 3 doses as needed for chest pain. 02/22/18   [name], MD     Objective     Physical Exam: Vitals:   06/27/18 1330 06/27/18 1345 06/27/18  1400 06/27/18 1415  BP: 107/66  107/68   Pulse: (!) 59 68 80 70  Resp: (!) '22 18 20 ' (!) 24  SpO2: 91% 95% 94% 98%  Weight:      Height:        General: appears to be stated age; alert, oriented Skin: warm, dry Head:  AT/[place] Mouth:  Oral mucosa membranes appear dry, normal dentition Neck: supple; trachea midline Heart:  RRR; did not appreciate any M/R/G Lungs: Expiratory wheezes noted in all lung fields bilaterally; did not appreciate rales or rhonchi. Abdomen: + BS; soft, ND, NT Extremities: no peripheral edema, no muscle wasting   Labs on Admission: I have personally reviewed following labs and imaging studies  CBC: Recent Labs  Lab 06/27/18 1245  WBC 7.5  NEUTROABS 4.3  HGB 16.3  HCT 46.7  MCV 97.3  PLT 096   Basic Metabolic Panel: Recent Labs  Lab 06/27/18 1245  NA 136  K 3.2*  CL 94*  CO2 30  GLUCOSE 110*  BUN 15  CREATININE 0.89  CALCIUM 8.4*   GFR: Estimated Creatinine Clearance: 96.9 mL/min (by C-G formula based on SCr of 0.89 mg/dL). Liver Function Tests: Recent Labs  Lab 06/27/18 1245  AST 32  ALT 21  ALKPHOS 67  BILITOT 1.0  PROT 7.5  ALBUMIN 3.9   No results for input(s): LIPASE, AMYLASE in the last 168 hours. No results for input(s): AMMONIA in the last 168 hours. Coagulation Profile: No results for input(s): INR, PROTIME in the last 168 hours. Cardiac Enzymes: No results for input(s): CKTOTAL, CKMB, CKMBINDEX, TROPONINI in the last 168 hours. BNP (last 3 results) No results for input(s): PROBNP in the last 8760 hours. HbA1C: No results for input(s): HGBA1C in the last 72 hours. CBG: No results for input(s): GLUCAP in the last 168 hours. Lipid Profile: No results for input(s): CHOL, HDL, LDLCALC, TRIG, CHOLHDL, LDLDIRECT in the last 72 hours. Thyroid Function Tests: No results for input(s): TSH, T4TOTAL, FREET4, T3FREE, THYROIDAB in the last 72 hours. Anemia Panel: No results for input(s): VITAMINB12, FOLATE, FERRITIN, TIBC, IRON,  RETICCTPCT in the last 72 hours. Urine analysis:    Component Value Date/Time   COLORURINE YELLOW 06/27/2018 1215   APPEARANCEUR CLEAR 06/27/2018 1215   LABSPEC 1.017 06/27/2018 1215   PHURINE 5.0 06/27/2018 1215   GLUCOSEU NEGATIVE 06/27/2018 1215   HGBUR NEGATIVE 06/27/2018 1215   BILIRUBINUR NEGATIVE 06/27/2018 1215   [place] 06/27/2018 1215   PROTEINUR 100 (A) 06/27/2018 1215  UROBILINOGEN 0.2 09/01/2009 0534   NITRITE NEGATIVE 06/27/2018 1215   LEUKOCYTESUR NEGATIVE 06/27/2018 1215    Radiological Exams on Admission: Dg Chest 2 View  Result Date: 06/27/2018 CLINICAL DATA:  Shortness of breath EXAM: CHEST - 2 VIEW COMPARISON:  02/15/2018 FINDINGS: The heart size and mediastinal contours are within normal limits. Both lungs are clear. The visualized skeletal structures are unremarkable. IMPRESSION: No active cardiopulmonary disease. Electronically Signed   By: Ulyses Jarred M.D.   On: 06/27/2018 14:10     Assessment/Plan   CARROLL LINGELBACH is a 63 y.o. male with medical history significant for coronary artery disease status post left heart catheterization in March 2019 revealing 70% stenosis of proximal LAD, hypertension, tobacco abuse, allergic rhinitis, who is admitted to [facility] on 06/27/2018 with acute bronchitis with bronchospasm after presenting from home to [facility] ED complaining of cough.    Principal Problem:   Acute bronchitis with bronchospasm Active Problems:   Essential hypertension   CAD (coronary artery disease), native coronary artery   Hypokalemia   Shortness of breath   #) Acute bronchitis with bronchospasm: Diagnosis on the basis of presenting 1 week of productive cough, shortness of breath, subjective fever, wheezing, and increased work of breathing, with presenting chest x-ray demonstrating no evidence of acute cardiopulmonary process, including no evidence of infiltrate.  Presentation is also associated with acute hypoxic  respiratory distress, with initial oxygen saturations noted to be in the mid 80s on room air in the context of no baseline supplemental oxygen requirements.  Subsequent oxygen saturations have improved into the mid 90s on 2 L nasal cannula.  Given preceding increase in rhinorrhea and rhinitis, suspect underlying viral pathology.  Of note, influenza PCR for was found to be negative for flu A and B.  While he does present with mild tachypnea, no additional sirs criteria are met at this time, and therefore criteria are not met for sepsis at the present time.  No recent sick contacts or traveling.  The patient denies any known underlying chronic pulmonary conditions, although he also acknowledges a long smoking history in which he smoked half pack per day over 40 years, having quit approximately 3 weeks ago.  He specifically denies any known underlying dose diagnosis of COPD, but also reports that he has not previously undergone formal pulmonary function testing. Interestingly, he on scheduled Breo-Ellipta and prn albuterol as an outpatient.  While the patient denies underlying COPD, there is suspicion for underlying obstructive pulmonary disease, and therefore will continue the Levaquin that was initiated in the emergency department this evening.  Plan: Scheduled duo nebulizer treatments every 6 hours while awake.  PRN albuterol nebulizers.  In the context of concomitant bronchospasm, I have ordered Solu-Medrol 40 mg IV every 6 hours.  Levaquin, as above.  Check sputum culture.  Will monitor for results of blood cultures x2 collected in the ED today.  Repeat CBC with differential in the morning.  PRN supplemental oxygen in order to maintain oxygen saturations greater than or equal to 92%.  Add on serum magnesium level to labs collected in the ED.  Droplet precautions.  Guaifenesin twice daily. Check VBG.  Will attempt to confirm no PFTs on file.     #) Hypokalemia: Presenting labs reflect serum potassium of  3.2.  Patient received KCl 40 equivalents p.o. x1 in the ED.  Of note, in the context of presenting bronchospasm, the patient empirically received 2 g of IV magnesium sulfate in the emergency  department today.  Plan: Add on serum magnesium level labs collected in the ED. recheck BMP in the morning.    #) History of Hypertension: On the following medications as an outpatient with antihypertensive ramifications: Imdur, lisinopril, Lopressor, and tamsulosin.  Borderline hypotension noted at time of presentation, which is subsequently improved with interval IV fluids.  Patient is unsure as to where his blood pressure typically runs as an outpatient.   Plan:  In the setting of borderline hypotension at time of presentation, will hold home Imdur, lisinopril, and tamsulosin.  Continue home Lopressor, with hold parameters.  Close monitoring of ensuing BP via routine monitoring of vital signs.     #) CAD: Coronary angiography in March 2019 reportedly revealed 70% stenosis of the proximal LAD, for which medical management was pursued.  Patient's home cardiac medications include daily baby aspirin, atorvastatin, lisinopril, Lopressor, and Imdur.  Of note, echocardiogram from March 2019 showed LVEF 55 to 60% with no focal wall motion abnormalities and normal diastolic function.  Patient denies any recent chest pain, and presenting EKG, relative to EKG from January 2020, shows sinus rhythm with nonspecific intraventricular conduction delay, and no interval T wave or ST changes.  All, no evidence to suggest ACS at this time.   Plan: Continue home aspirin, atorvastatin, and Lopressor.  Will hold lisinopril and Imdur for now in the setting of presenting borderline hypotension, with plan to reevaluate timing of resumption in the morning upon review of interval BP's.     #) GERD: On Protonix as an outpatient.  Plan: Continue home PPI.    #) Benign prostatic hyperplasia: On Flomax as an outpatient.  Plan:  Given presenting borderline hypotension, will hold this evening's dose of Flomax.     DVT prophylaxis: Lovenox 4 mg subcu daily Code Status: Full Family Communication: None Disposition Plan: *Per Rounding Team Consults called: none Admission status: inpatient; med-tele.     PLEASE NOTE THAT DRAGON DICTATION SOFTWARE WAS USED IN THE CONSTRUCTION OF THIS NOTE.   [place] [facility] Pager [phone] From 3PM- 11PM.   Otherwise, please contact night-coverage  [email or web address] Password [facility]  06/27/2018, 3:55 PM

## 2018-06-27 NOTE — ED Provider Notes (Signed)
[facility] EMERGENCY DEPARTMENT Provider Note   CSN: [id number] Arrival date & time: 06/27/18  1149    History   Chief Complaint Chief Complaint  Patient presents with  . Shortness of Breath    HPI [name] is a 63 y.o. male.     Patient's had a cough and shortness of breath for couple weeks.  Was put on Augmentin without help came in here complaining of continued cough and shortness of breath  The history is provided by the patient. No language interpreter was used.  Shortness of Breath  Severity:  Moderate Onset quality:  Sudden Timing:  Constant Progression:  Worsening Chronicity:  New Context: not activity   Relieved by:  Nothing Worsened by:  Nothing Ineffective treatments:  None tried Associated symptoms: wheezing   Associated symptoms: no abdominal pain, no chest pain, no cough, no headaches and no rash   Risk factors: no recent alcohol use     Past Medical History:  Diagnosis Date  . Cerebral palsy ([place])   . Coronary artery disease    LAD 70% stenosis, cath 2019  . Depression   . GERD (gastroesophageal reflux disease)   . Hypertension   . Scoliosis   . Seizures ([place])    pt's sister states that patinet has had seizures in the past, pt camr for PAT by himself on last visit and she stst "they misunderstood what he was trying to say. Pt saw neurologist in March who did CT and states patient does not have seizures. On no meds, had seizures as child.    Patient Active Problem List   Diagnosis Date Noted  . Acute bronchitis with bronchospasm 06/27/2018  . Dyslipidemia 04/27/2018  . Seizure disorder ([place]) 02/07/2018  . Apraxia of speech 10/13/2017  . Left-sided weakness 10/13/2017  . CAD (coronary artery disease), native coronary artery   . Smoker   . Illiteracy 10/23/2016  . Carpal tunnel syndrome of right wrist 06/15/2016  . GAD (generalized anxiety disorder) 05/01/2016  . History of MI (myocardial infarction) 02/26/2015  . Dyslipidemia, goal  LDL below 70 10/08/2009  . Infantile cerebral palsy ([place]) 10/08/2009  . Essential hypertension 10/08/2009    Past Surgical History:  Procedure Laterality Date  . CARPAL TUNNEL RELEASE Right 08/13/2016   Procedure: CARPAL TUNNEL RELEASE;  Surgeon: Carole Civil, MD;  Location: [facility];  Service: Orthopedics;  Laterality: Right;  . CORONARY ANGIOPLASTY WITH STENT PLACEMENT    . LEFT HEART CATH AND CORONARY ANGIOGRAPHY N/A 07/10/2017   Procedure: LEFT HEART CATH AND CORONARY ANGIOGRAPHY;  Surgeon: Troy Sine, MD;  Location: [facility];  Service: Cardiovascular;  Laterality: N/A;        Home Medications    Prior to Admission medications   Medication Sig Start Date End Date Taking? Authorizing Provider  acetaminophen (TYLENOL) 325 MG tablet Take 2 tablets (650 mg total) by mouth every 6 (six) hours as needed for mild pain or headache. 07/11/17  Yes Kilroy, Luke K, PA-C  albuterol (PROAIR HFA) 108 (90 Base) MCG/ACT inhaler 1-2 puffs every 6 hours as needed wheezing or shortness of breath. 02/22/18  Yes [name], MD  amoxicillin-clavulanate (AUGMENTIN) 875-125 MG tablet Take 1 tablet by mouth 2 (two) times daily with a meal. Patient taking differently: Take 1 tablet by mouth 2 (two) times daily with a meal. 20 day course starting on 06/20/2018 06/20/18  Yes [name], MD  aspirin 81 MG tablet Take 1 tablet (81 mg total) by  mouth daily. 06/20/18  Yes [name], MD  atorvastatin (LIPITOR) 40 MG tablet Take 1 tablet (40 mg total) by mouth daily at 6 PM. 03/28/18  Yes [name], [name], MD  buPROPion [facility] SR) 150 MG 12 hr tablet Take 1 tablet (150 mg total) by mouth 2 (two) times daily. 03/28/18  Yes [name], [name], MD  DULoxetine (CYMBALTA) 30 MG capsule Take 2 capsules (60 mg total) by mouth daily. WITH A FULL STOMACH AT SUPPER TIME 03/28/18  Yes [name], [name], MD  fluticasone [facility]) 50 MCG/ACT nasal spray Place 2 sprays into both nostrils daily. Patient taking  differently: Place 2 sprays into both nostrils daily as needed for allergies or rhinitis.  02/22/18  Yes [name], [name], MD  fluticasone furoate-vilanterol (BREO ELLIPTA) 100-25 MCG/INH AEPB Inhale 1 puff into the lungs daily. Patient taking differently: Inhale 1 puff into the lungs daily. Inhale 1 puff into the lungs daily. 05/04/18  Yes [name], MD  isosorbide mononitrate (IMDUR) 30 MG 24 hr tablet Take 1 tablet (30 mg total) by mouth daily. 03/28/18  Yes [name], [name], MD  lisinopril (PRINIVIL,ZESTRIL) 20 MG tablet Take 1 tablet (20 mg total) by mouth daily. 03/28/18  Yes [name], MD  metoprolol tartrate (LOPRESSOR) 25 MG tablet Take 1 tablet (25 mg total) by mouth 2 (two) times daily. 03/28/18  Yes [name], MD  nitroGLYCERIN (NITROSTAT) 0.4 MG SL tablet Place 1 tablet (0.4 mg total) under the tongue every 5 (five) minutes x 3 doses as needed for chest pain. 02/22/18  Yes [name], MD  pantoprazole (PROTONIX) 40 MG tablet Take 1 tablet (40 mg total) by mouth 2 (two) times daily. 03/28/18  Yes [name], [name], MD  tamsulosin (FLOMAX) 0.4 MG CAPS capsule TAKE (1) CAPSULE DAILY Patient taking differently: Take 0.4 mg by mouth every morning. TAKE (1) CAPSULE DAILY 02/22/18  Yes [name], [name], MD  fexofenadine (ALLEGRA) 180 MG tablet Take 1 tablet (180 mg total) by mouth daily. For allergy symptoms Patient not taking: Reported on 06/27/2018 04/05/18   [name], MD    Family History Family History  Problem Relation Age of Onset  . CAD Father 48       CABG  . Heart disease Father   . CAD Brother   . Diabetes Mother   . Alcohol abuse Brother   . CAD Sister   . Heart failure Sister     Social History Social History   Tobacco Use  . Smoking status: Former Smoker    Packs/day: 0.50    Years: 41.00    Pack years: 20.50    Types: Cigarettes    Last attempt to quit: 06/06/2018    Years since quitting: 0.0  . Smokeless tobacco: Never Used  . Tobacco comment: Patient  quit on his own two weeks ago  Substance Use Topics  . Alcohol use: Yes    Comment: "once in a blue moon" typically drinks beer 2 weeks or longer  . Drug use: No     Allergies   Chantix [varenicline tartrate]   Review of Systems Review of Systems  Constitutional: Negative for appetite change and fatigue.  HENT: Negative for congestion, ear discharge and sinus pressure.   Eyes: Negative for discharge.  Respiratory: Positive for shortness of breath and wheezing. Negative for cough.   Cardiovascular: Negative for chest pain.  Gastrointestinal: Negative for abdominal pain and diarrhea.  Genitourinary: Negative for frequency and hematuria.  Musculoskeletal: Negative for back pain.  Skin: Negative for rash.  Neurological: Negative for seizures and headaches.  Psychiatric/Behavioral: Negative for hallucinations.     Physical Exam Updated Vital Signs BP 107/68   Pulse 70   Resp (!) 24   Ht 6' (1.829 m)   Wt 90.7 kg   SpO2 98%   BMI 27.12 kg/m   Physical Exam Vitals signs and nursing note reviewed.  Constitutional:      Appearance: He is well-developed.  HENT:     Head: Normocephalic.     Nose: Nose normal.  Eyes:     General: No scleral icterus.    Conjunctiva/sclera: Conjunctivae normal.  Neck:     Musculoskeletal: Neck supple.     Thyroid: No thyromegaly.  Cardiovascular:     Rate and Rhythm: Normal rate and regular rhythm.     Heart sounds: No murmur. No friction rub. No gallop.   Pulmonary:     Breath sounds: No stridor. Wheezing present. No rales.  Chest:     Chest wall: No tenderness.  Abdominal:     General: There is no distension.     Tenderness: There is no abdominal tenderness. There is no rebound.  Musculoskeletal: Normal range of motion.  Lymphadenopathy:     Cervical: No cervical adenopathy.  Skin:    Findings: No erythema or rash.  Neurological:     Mental Status: He is oriented to person, place, and time.     Motor: No abnormal muscle tone.       Coordination: Coordination normal.  Psychiatric:        Behavior: Behavior normal.      ED Treatments / Results  Labs (all labs ordered are listed, but only abnormal results are displayed) Labs Reviewed  COMPREHENSIVE METABOLIC PANEL - Abnormal; Notable for the following components:      Result Value   Potassium 3.2 (*)    Chloride 94 (*)    Glucose, Bld 110 (*)    Calcium 8.4 (*)    All other components within normal limits  URINALYSIS, ROUTINE W REFLEX MICROSCOPIC - Abnormal; Notable for the following components:   Protein, ur 100 (*)    All other components within normal limits  CULTURE, BLOOD (ROUTINE X 2)  CULTURE, BLOOD (ROUTINE X 2)  LACTIC ACID, PLASMA  LACTIC ACID, PLASMA  CBC WITH DIFFERENTIAL/PLATELET  INFLUENZA PANEL BY PCR (TYPE A & B)    EKG None  Radiology Dg Chest 2 View  Result Date: 06/27/2018 CLINICAL DATA:  Shortness of breath EXAM: CHEST - 2 VIEW COMPARISON:  02/15/2018 FINDINGS: The heart size and mediastinal contours are within normal limits. Both lungs are clear. The visualized skeletal structures are unremarkable. IMPRESSION: No active cardiopulmonary disease. Electronically Signed   By: Ulyses Jarred M.D.   On: 06/27/2018 14:10    Procedures Procedures (including critical care time)  Medications Ordered in ED Medications  magnesium sulfate IVPB 2 g 50 mL (0 g Intravenous Stopped 06/27/18 1411)  sodium chloride 0.9 % bolus 2,000 mL (0 mLs Intravenous Stopped 06/27/18 1507)  levofloxacin (LEVAQUIN) IVPB 500 mg (0 mg Intravenous Stopped 06/27/18 1508)  ipratropium-albuterol (DUONEB) 0.5-2.5 (3) MG/3ML nebulizer solution 3 mL (3 mLs Nebulization Given 06/27/18 1311)  ipratropium (ATROVENT) nebulizer solution 0.5 mg (0.5 mg Nebulization Given 06/27/18 1312)  potassium chloride SA (K-DUR,KLOR-CON) CR tablet 40 mEq (40 mEq Oral Given 06/27/18 1415)     Initial Impression / Assessment and Plan / ED Course  I have reviewed the triage vital signs and the  nursing notes.  Pertinent  labs & imaging results that were available during my care of the patient were reviewed by me and considered in my medical decision making (see chart for details).    CRITICAL CARE Performed by: Milton Ferguson Total critical care time:35 minutes Critical care time was exclusive of separately billable procedures and treating other patients. Critical care was necessary to treat or prevent imminent or life-threatening deterioration. Critical care was time spent personally by me on the following activities: development of treatment plan with patient and/or surrogate as well as nursing, discussions with consultants, evaluation of patient's response to treatment, examination of patient, obtaining history from patient or surrogate, ordering and performing treatments and interventions, ordering and review of laboratory studies, ordering and review of radiographic studies, pulse oximetry and re-evaluation of patient's condition.    Patient wheezing improved some with neb treatments but he was still hypoxic on room air at around 87%.  Patient will be admitted for of respiratory infection hypoxia and bronchospasm   Final Clinical Impressions(s) / ED Diagnoses   Final diagnoses:  COPD exacerbation [facility])    ED Discharge Orders    None       Milton Ferguson, MD 06/27/18 1622

## 2018-06-27 NOTE — ED Notes (Signed)
Documentation by RN student reviewed and verified.

## 2018-06-28 ENCOUNTER — Ambulatory Visit: Payer: Medicare Other | Admitting: *Deleted

## 2018-06-28 DIAGNOSIS — J441 Chronic obstructive pulmonary disease with (acute) exacerbation: Secondary | ICD-10-CM

## 2018-06-28 DIAGNOSIS — F411 Generalized anxiety disorder: Secondary | ICD-10-CM

## 2018-06-28 DIAGNOSIS — G809 Cerebral palsy, unspecified: Secondary | ICD-10-CM

## 2018-06-28 LAB — CBC
HCT: 43.4 % (ref 39.0–52.0)
Hemoglobin: 14.9 g/dL (ref 13.0–17.0)
MCH: 33.4 pg (ref 26.0–34.0)
MCHC: 34.3 g/dL (ref 30.0–36.0)
MCV: 97.3 fL (ref 80.0–100.0)
Platelets: 160 10*3/uL (ref 150–400)
RBC: 4.46 MIL/uL (ref 4.22–5.81)
RDW: 13 % (ref 11.5–15.5)
WBC: 4.9 10*3/uL (ref 4.0–10.5)
nRBC: 0 % (ref 0.0–0.2)

## 2018-06-28 LAB — EXPECTORATED SPUTUM ASSESSMENT W GRAM STAIN, RFLX TO RESP C

## 2018-06-28 LAB — BASIC METABOLIC PANEL
Anion gap: 9 (ref 5–15)
BUN: 12 mg/dL (ref 6–20)
CO2: 29 mmol/L (ref 22–32)
Calcium: 8.7 mg/dL — ABNORMAL LOW (ref 8.9–10.3)
Chloride: 100 mmol/L (ref 98–111)
Creatinine, Ser: 0.67 mg/dL (ref 0.61–1.24)
GFR calc Af Amer: >60 mL/min (ref >60)
Glucose, Bld: 119 mg/dL — ABNORMAL HIGH (ref 70–99)
Potassium: 4 mmol/L (ref 3.5–5.1)
Sodium: 138 mmol/L (ref 135–145)

## 2018-06-28 LAB — BLOOD GAS, VENOUS
Acid-Base Excess: 6.8 mmol/L — ABNORMAL HIGH (ref 0.0–2.0)
Bicarbonate: 29.4 mmol/L — ABNORMAL HIGH (ref 20.0–28.0)
FIO2: 30
O2 Saturation: 93.1 %
Patient temperature: 36.8
pCO2, Ven: 52 mmHg (ref 44.0–60.0)
pH, Ven: 7.401 (ref 7.250–7.430)
pO2, Ven: 70.4 mmHg — ABNORMAL HIGH (ref 32.0–45.0)

## 2018-06-28 LAB — MAGNESIUM: Magnesium: 2 mg/dL (ref 1.7–2.4)

## 2018-06-28 LAB — EXPECTORATED SPUTUM ASSESSMENT W REFEX TO RESP CULTURE: SPECIAL REQUESTS: NORMAL

## 2018-06-28 MED ORDER — ISOSORBIDE MONONITRATE ER 60 MG PO TB24
30.0000 mg | ORAL_TABLET | Freq: Every day | ORAL | Status: DC
  Administered 2018-06-28 – 2018-06-30 (×3): 30 mg via ORAL
  Filled 2018-06-28 (×3): qty 1

## 2018-06-28 MED ORDER — LISINOPRIL 10 MG PO TABS
20.0000 mg | ORAL_TABLET | Freq: Every day | ORAL | Status: DC
  Administered 2018-06-28 – 2018-06-30 (×3): 20 mg via ORAL
  Filled 2018-06-28 (×4): qty 2

## 2018-06-28 MED ORDER — BUDESONIDE 0.25 MG/2ML IN SUSP
0.2500 mg | Freq: Two times a day (BID) | RESPIRATORY_TRACT | Status: DC
  Administered 2018-06-28 – 2018-06-30 (×4): 0.25 mg via RESPIRATORY_TRACT
  Filled 2018-06-28 (×4): qty 2

## 2018-06-28 MED ORDER — TAMSULOSIN HCL 0.4 MG PO CAPS
0.4000 mg | ORAL_CAPSULE | Freq: Every day | ORAL | Status: DC
  Administered 2018-06-28 – 2018-06-30 (×3): 0.4 mg via ORAL
  Filled 2018-06-28 (×3): qty 1

## 2018-06-28 NOTE — Patient Instructions (Addendum)
Plan RNCM will follow up via telephone within 5 days of discharge RNCM will work with patient after discharge to develop a COPD action plan to minimize exacerbations and prevent ED visits and hospitalizations  [name], RN-BC, BSN Nurse Case Manager [place] [phone]

## 2018-06-28 NOTE — Progress Notes (Signed)
Patient agitated at change of shift.  Patient very concerned about pet that is at home.  Patient threatened to leave AMA.  Explained to patient that he still was not safe for discharge, no safe to walk the distance he needed to walk to get home.  Patient very irate. Patient family did come to floor and calm patient.  Patient at this time agreeable to stay due to family saying they will take care of pet.

## 2018-06-28 NOTE — Progress Notes (Signed)
PROGRESS NOTE    [name]  KZS:[id number] DOB: 01-27-1956 DOA: 06/27/2018 PCP: [name], MD    Brief Narrative:  63 year old male with a history of coronary artery disease, hypertension, tobacco use, COPD, admitted to the hospital with shortness of breath, cough and wheezing.  Found to have COPD exacerbation likely precipitated by underlying bronchitis.  Admitted for IV steroids, bronchodilators and antibiotics.   Assessment & Plan:   Principal Problem:   Acute bronchitis with bronchospasm Active Problems:   Essential hypertension   CAD (coronary artery disease), native coronary artery   Hypokalemia   Shortness of breath   1. COPD exacerbation, likely precipitated by acute bronchitis.  Since patient is still short of breath and wheezing, will continue on steroids, bronchodilators and antibiotics.  Continue pulmonary hygiene.  Influenza panel negative. 2. Hypokalemia.  Replaced.  Magnesium normal 3. Hypertension.  Stable on Lopressor.  Resume home dose of lisinopril 4. Coronary artery disease.  No complaints of chest pain at this time.  No acute EKG changes.  Resume home dose of Imdur 5. Tobacco use.  Counseled importance of tobacco cessation. 6. BPH.  Continue Flomax 7. GERD.  Continue PPI   DVT prophylaxis: Lovenox Code Status: Full code Family Communication: No family present Disposition Plan: Discharge home once respiratory status has improved   Consultants:     Procedures:     Antimicrobials:   Levaquin 3/9 >   Subjective: Continues to feel short of breath, has cough and wheezing.  Objective: Vitals:   06/28/18 1429 06/28/18 1830 06/28/18 1900 06/28/18 1951  BP:      Pulse:      Resp:      Temp:      TempSrc:      SpO2: 98% 94% 91% 93%  Weight:      Height:        Intake/Output Summary (Last 24 hours) at 06/28/2018 2044 Last data filed at 06/28/2018 2000 Gross per 24 hour  Intake 3028.86 ml  Output 4400 ml  Net -1371.14 ml   Filed  Weights   06/27/18 1153 06/27/18 1654 06/28/18 0500  Weight: 90.7 kg 99.7 kg 99.4 kg    Examination:  General exam: Appears calm and comfortable  Respiratory system: Bilateral rhonchi and wheezing. Respiratory effort normal. Cardiovascular system: S1 & S2 heard, RRR. No JVD, murmurs, rubs, gallops or clicks. No pedal edema. Gastrointestinal system: Abdomen is nondistended, soft and nontender. No organomegaly or masses felt. Normal bowel sounds heard. Central nervous system: Alert and oriented. No focal neurological deficits. Extremities: Symmetric 5 x 5 power. Skin: No rashes, lesions or ulcers Psychiatry: Judgement and insight appear normal. Mood & affect appropriate.     Data Reviewed: I have personally reviewed following labs and imaging studies  CBC: Recent Labs  Lab 06/27/18 1245 06/28/18 0433  WBC 7.5 4.9  NEUTROABS 4.3  --   HGB 16.3 14.9  HCT 46.7 43.4  MCV 97.3 97.3  PLT 168 732   Basic Metabolic Panel: Recent Labs  Lab 06/27/18 1245 06/28/18 0433  NA 136 138  K 3.2* 4.0  CL 94* 100  CO2 30 29  GLUCOSE 110* 119*  BUN 15 12  CREATININE 0.89 0.67  CALCIUM 8.4* 8.7*  MG 1.9 2.0   GFR: Estimated Creatinine Clearance: 119.9 mL/min (by C-G formula based on SCr of 0.67 mg/dL). Liver Function Tests: Recent Labs  Lab 06/27/18 1245  AST 32  ALT 21  ALKPHOS 67  BILITOT 1.0  PROT 7.5  ALBUMIN 3.9   No results for input(s): LIPASE, AMYLASE in the last 168 hours. No results for input(s): AMMONIA in the last 168 hours. Coagulation Profile: No results for input(s): INR, PROTIME in the last 168 hours. Cardiac Enzymes: No results for input(s): CKTOTAL, CKMB, CKMBINDEX, TROPONINI in the last 168 hours. BNP (last 3 results) No results for input(s): PROBNP in the last 8760 hours. HbA1C: No results for input(s): HGBA1C in the last 72 hours. CBG: No results for input(s): GLUCAP in the last 168 hours. Lipid Profile: No results for input(s): CHOL, HDL,  LDLCALC, TRIG, CHOLHDL, LDLDIRECT in the last 72 hours. Thyroid Function Tests: No results for input(s): TSH, T4TOTAL, FREET4, T3FREE, THYROIDAB in the last 72 hours. Anemia Panel: No results for input(s): VITAMINB12, FOLATE, FERRITIN, TIBC, IRON, RETICCTPCT in the last 72 hours. Sepsis Labs: Recent Labs  Lab 06/27/18 1245 06/27/18 1435  LATICACIDVEN 1.7 1.3    Recent Results (from the past 240 hour(s))  Blood Culture (routine x 2)     Status: None (Preliminary result)   Collection Time: 06/27/18 12:35 PM  Result Value Ref Range Status   Specimen Description BLOOD RIGHT HAND  Final   Special Requests   Final    BOTTLES DRAWN AEROBIC AND ANAEROBIC Blood Culture adequate volume   Culture   Final    NO GROWTH < 24 HOURS Performed at [facility], [address], [place], [place] [address]    Report Status PENDING  Incomplete  Blood Culture (routine x 2)     Status: None (Preliminary result)   Collection Time: 06/27/18 12:39 PM  Result Value Ref Range Status   Specimen Description BLOOD LEFT ANTECUBITAL  Final   Special Requests   Final    BOTTLES DRAWN AEROBIC AND ANAEROBIC Blood Culture adequate volume   Culture   Final    NO GROWTH < 24 HOURS Performed at [facility], [address] [place], [place] [address]    Report Status PENDING  Incomplete  Expectorated sputum assessment w rflx to resp cult     Status: None   Collection Time: 06/28/18  5:07 AM  Result Value Ref Range Status   Specimen Description SPU  Final   Special Requests Normal  Final   Sputum evaluation   Final    THIS SPECIMEN IS ACCEPTABLE FOR SPUTUM CULTURE Performed at [facility], [address] [place], [place] [address]    Report Status 06/28/2018 FINAL  Final  Culture, respiratory     Status: None (Preliminary result)   Collection Time: 06/28/18  5:07 AM  Result Value Ref Range Status   Specimen Description   Final    SPU Performed at [facility], [address], [place], [place] [address]     Special Requests   Final    Normal Reflexed from [phone] Performed at [facility], [address], [place], [place] [address]    Gram Stain   Final    FEW SQUAMOUS EPITHELIAL CELLS PRESENT FEW WBC PRESENT,BOTH PMN AND MONONUCLEAR MODERATE GRAM POSITIVE COCCI IN PAIRS AND CHAINS FEW GRAM NEGATIVE RODS FEW GRAM POSITIVE COCCI IN CLUSTERS FEW GRAM VARIABLE ROD Performed at [facility], [place] [address], [place],  [address]    Culture PENDING  Incomplete   Report Status PENDING  Incomplete         Radiology Studies: Dg Chest 2 View  Result Date: 06/27/2018 CLINICAL DATA:  Shortness of breath EXAM: CHEST - 2 VIEW COMPARISON:  02/15/2018 FINDINGS: The  heart size and mediastinal contours are within normal limits. Both lungs are clear. The visualized skeletal structures are unremarkable. IMPRESSION: No active cardiopulmonary disease. Electronically Signed   By: Ulyses Jarred M.D.   On: 06/27/2018 14:10        Scheduled Meds: . aspirin EC  81 mg Oral Daily  . atorvastatin  40 mg Oral q1800  . budesonide (PULMICORT) nebulizer solution  0.25 mg Nebulization BID  . buPROPion  150 mg Oral BID  . DULoxetine  60 mg Oral Daily  . enoxaparin (LOVENOX) injection  40 mg Subcutaneous Q24H  . guaiFENesin  600 mg Oral BID  . ipratropium-albuterol  3 mL Nebulization Q6H WA  . methylPREDNISolone (SOLU-MEDROL) injection  40 mg Intravenous Q6H  . metoprolol tartrate  25 mg Oral BID  . pantoprazole  40 mg Oral BID   Continuous Infusions: . lactated ringers 100 mL/hr at 06/28/18 1158  . levofloxacin (LEVAQUIN) IV 500 mg (06/28/18 1439)     LOS: 1 day    Time spent: 30 minutes    Kathie Dike, MD [facility]   If 7PM-7AM, please contact night-coverage [email or web address]  06/28/2018, 8:44 PM

## 2018-06-28 NOTE — Chronic Care Management (AMB) (Signed)
  Chronic Care Management   Telephone Follow Up Note  06/28/2018 Name: [name] MRN: [id number] DOB: Jan 04, 1956  Subjective [name] is a 63 year old male primary care patient of Dr Livia Snellen. He was seen in the office last week for a CCM face to face visit and complained of acute sinusitis symptoms. He was given Augmentin and asked to follow up if symptoms worsened or did not improve. At the time he did not have any chest congestion, cough, or wheezing. Symptoms were primary nasal congestion and head pain/pressure.   Yesterday he felt it necessary to call 911 because his symptoms had worsened. He had cough and wheezing. Sinusitis has resolved. He was admitted for COPD exacerbation and was inpatient when I spoke with him this afternoon.   Objective In patient CXR negative on 06/27/2018 Sputum culture pending Flu negative CBC WNL Potassium low yesterday but corrected today  Patient became agitated during the conversation because he is concerned about his dog being home alone without anyone to provide food and water.  Assessment and Plan Current hospitalization for COPD exacerbation RNCM will follow up via telephone within 5 days of discharge RNCM will work with patient after discharge to develop a COPD action plan to minimize exacerbations and prevent ED visits and hospitalizations  I called his sister, [name], who is listed as his emergency contact and left a message on her phone to return my call regarding his concern for his dog being home alone.   [name], RN-BC, BSN Nurse Case Manager [place] [phone]

## 2018-06-29 ENCOUNTER — Ambulatory Visit: Payer: Self-pay | Admitting: Licensed Clinical Social Worker

## 2018-06-29 DIAGNOSIS — Z550 Illiteracy and low-level literacy: Secondary | ICD-10-CM

## 2018-06-29 DIAGNOSIS — I209 Angina pectoris, unspecified: Secondary | ICD-10-CM

## 2018-06-29 DIAGNOSIS — F411 Generalized anxiety disorder: Secondary | ICD-10-CM

## 2018-06-29 DIAGNOSIS — R482 Apraxia: Secondary | ICD-10-CM

## 2018-06-29 DIAGNOSIS — G809 Cerebral palsy, unspecified: Secondary | ICD-10-CM

## 2018-06-29 DIAGNOSIS — I25118 Atherosclerotic heart disease of native coronary artery with other forms of angina pectoris: Secondary | ICD-10-CM

## 2018-06-29 DIAGNOSIS — I10 Essential (primary) hypertension: Secondary | ICD-10-CM | POA: Diagnosis not present

## 2018-06-29 DIAGNOSIS — I252 Old myocardial infarction: Secondary | ICD-10-CM

## 2018-06-29 DIAGNOSIS — E785 Hyperlipidemia, unspecified: Secondary | ICD-10-CM

## 2018-06-29 DIAGNOSIS — F17200 Nicotine dependence, unspecified, uncomplicated: Secondary | ICD-10-CM

## 2018-06-29 MED ORDER — IPRATROPIUM-ALBUTEROL 0.5-2.5 (3) MG/3ML IN SOLN
3.0000 mL | Freq: Three times a day (TID) | RESPIRATORY_TRACT | Status: DC
  Administered 2018-06-30: 3 mL via RESPIRATORY_TRACT
  Filled 2018-06-29: qty 3

## 2018-06-29 MED ORDER — METHYLPREDNISOLONE SODIUM SUCC 40 MG IJ SOLR
40.0000 mg | Freq: Two times a day (BID) | INTRAMUSCULAR | Status: DC
  Administered 2018-06-29 – 2018-06-30 (×2): 40 mg via INTRAVENOUS
  Filled 2018-06-29 (×2): qty 1

## 2018-06-29 NOTE — Patient Instructions (Signed)
Licensed Clinical Social Worker Visit Information  Goals we discussed today:  Goals Addressed            This Visit's Progress   . : " I need to get through all this legal stuff " (pt-stated)       Clinical Goal(s):  Over the next 30 days, patient will affirm attendance to scheduled court hearing and verbalize understanding of any ongoing legal obligations.   Interventions:  [name] reviewed ongoing needs around legal issues Confirmed that patient remains in contact with his attorney [name] talked with client about client's last court hearing and date and time of client's next court hearing [name] again reminded client that he could schedule [facility] area [facility] as needed for attendance at required court hearings.  Patient Self Care Activities:   Attends scheduled medical appointments  Takes medications as prescribed  Communicates with attorney to discuss client's legal issues  Communicates with RNCM to discuss nursing needs of client  Plan:  [name] to call client in 3 weeks to discuss with client legal issues client is facing and to encourage client to work with attorney of client regarding client's legal issues.   Client to call RN CM as needed to discuss nursing needs of client  Client to attend scheduled medical appointments  Client to call office of Dr. Livia Snellen to report medical needs of client      Materials Provided: No  Follow Up Plan: [name] to call client in next 3 weeks to talk with client about legal issues faced and to encourage [place] to talk with his attorney regarding client's legal issues  The patient verbalized understanding of instructions provided today and declined a print copy of patient instruction materials.    [name].Dezaree Tracey MSW, [name] Licensed Clinical Social Worker [facility] [phone]

## 2018-06-29 NOTE — Progress Notes (Signed)
PROGRESS NOTE    [name]  EYC:[id number] DOB: [birth year] DOA: 06/27/2018 PCP: [name], MD   Brief Narrative:   63 year old male with a history of coronary artery disease, hypertension, tobacco use, COPD, admitted to the hospital with shortness of breath, cough and wheezing.  Found to have COPD exacerbation likely precipitated by underlying bronchitis.  Admitted for IV steroids, bronchodilators and antibiotics.  Assessment & Plan:   Principal Problem:   Acute bronchitis with bronchospasm Active Problems:   Essential hypertension   CAD (coronary artery disease), native coronary artery   Hypokalemia   Shortness of breath   COPD with acute exacerbation ([place])  1. COPD exacerbation, likely precipitated by acute bronchitis-improving.  Since patient is still short of breath and wheezing, will continue on steroids, bronchodilators and antibiotics.  Steroids will be weaned down today. Continue pulmonary hygiene.  Influenza panel negative. 2. Hypokalemia.  Replaced.  Magnesium normal 3. Hypertension.  Stable on Lopressor.  Resume home dose of lisinopril 4. Coronary artery disease.  No complaints of chest pain at this time.  No acute EKG changes.  Resume home dose of Imdur 5. Tobacco use.  Counseled importance of tobacco cessation. 6. BPH.  Continue Flomax 7. GERD.  Continue PPI   DVT prophylaxis: Lovenox Code Status: Full code Family Communication: No family present Disposition Plan: Discharge home once respiratory status has improved likely in the next 24 to 48 hours.   Consultants:   None  Procedures:   None  Antimicrobials:   Levaquin 3/9 >  Subjective: Patient seen and evaluated today with no new acute complaints or concerns. No acute concerns or events noted overnight.  He did try to leave AMA so that he can take care of his pet dog, but family members have agreed to care for the dog.  He continues to have ongoing shortness of breath and wheezing and is  still not back to baseline as of yet, but is improving.  Objective: Vitals:   06/28/18 2158 06/29/18 0540 06/29/18 0736 06/29/18 0741  BP:  (!) 163/94    Pulse:  67    Resp:  18    Temp:  98.2 F (36.8 C)    TempSrc:  Oral    SpO2: 92% 94% 97% 97%  Weight:  99 kg    Height:        Intake/Output Summary (Last 24 hours) at 06/29/2018 0855 Last data filed at 06/29/2018 0500 Gross per 24 hour  Intake 2040.52 ml  Output 3650 ml  Net -1609.48 ml   Filed Weights   06/27/18 1654 06/28/18 0500 06/29/18 0540  Weight: 99.7 kg 99.4 kg 99 kg    Examination:  General exam: Appears calm and comfortable  Respiratory system: Clear to auscultation. Respiratory effort normal.  Currently on breathing treatment.  Ongoing wheezing noted at bases. Cardiovascular system: S1 & S2 heard, RRR. No JVD, murmurs, rubs, gallops or clicks. No pedal edema. Gastrointestinal system: Abdomen is nondistended, soft and nontender. No organomegaly or masses felt. Normal bowel sounds heard. Central nervous system: Alert and oriented. No focal neurological deficits. Extremities: Symmetric 5 x 5 power. Skin: No rashes, lesions or ulcers Psychiatry: Judgement and insight appear normal. Mood & affect appropriate.     Data Reviewed: I have personally reviewed following labs and imaging studies  CBC: Recent Labs  Lab 06/27/18 1245 06/28/18 0433  WBC 7.5 4.9  NEUTROABS 4.3  --   HGB 16.3 14.9  HCT 46.7 43.4  MCV 97.3 97.3  PLT  168 751   Basic Metabolic Panel: Recent Labs  Lab 06/27/18 1245 06/28/18 0433  NA 136 138  K 3.2* 4.0  CL 94* 100  CO2 30 29  GLUCOSE 110* 119*  BUN 15 12  CREATININE 0.89 0.67  CALCIUM 8.4* 8.7*  MG 1.9 2.0   GFR: Estimated Creatinine Clearance: 119.7 mL/min (by C-G formula based on SCr of 0.67 mg/dL). Liver Function Tests: Recent Labs  Lab 06/27/18 1245  AST 32  ALT 21  ALKPHOS 67  BILITOT 1.0  PROT 7.5  ALBUMIN 3.9   No results for input(s): LIPASE, AMYLASE  in the last 168 hours. No results for input(s): AMMONIA in the last 168 hours. Coagulation Profile: No results for input(s): INR, PROTIME in the last 168 hours. Cardiac Enzymes: No results for input(s): CKTOTAL, CKMB, CKMBINDEX, TROPONINI in the last 168 hours. BNP (last 3 results) No results for input(s): PROBNP in the last 8760 hours. HbA1C: No results for input(s): HGBA1C in the last 72 hours. CBG: No results for input(s): GLUCAP in the last 168 hours. Lipid Profile: No results for input(s): CHOL, HDL, LDLCALC, TRIG, CHOLHDL, LDLDIRECT in the last 72 hours. Thyroid Function Tests: No results for input(s): TSH, T4TOTAL, FREET4, T3FREE, THYROIDAB in the last 72 hours. Anemia Panel: No results for input(s): VITAMINB12, FOLATE, FERRITIN, TIBC, IRON, RETICCTPCT in the last 72 hours. Sepsis Labs: Recent Labs  Lab 06/27/18 1245 06/27/18 1435  LATICACIDVEN 1.7 1.3    Recent Results (from the past 240 hour(s))  Blood Culture (routine x 2)     Status: None (Preliminary result)   Collection Time: 06/27/18 12:35 PM  Result Value Ref Range Status   Specimen Description BLOOD RIGHT HAND  Final   Special Requests   Final    BOTTLES DRAWN AEROBIC AND ANAEROBIC Blood Culture adequate volume   Culture   Final    NO GROWTH 2 DAYS Performed at [facility], [address] [place], [place] [address]    Report Status PENDING  Incomplete  Blood Culture (routine x 2)     Status: None (Preliminary result)   Collection Time: 06/27/18 12:39 PM  Result Value Ref Range Status   Specimen Description BLOOD LEFT ANTECUBITAL  Final   Special Requests   Final    BOTTLES DRAWN AEROBIC AND ANAEROBIC Blood Culture adequate volume   Culture   Final    NO GROWTH 2 DAYS Performed at [facility], [address] [place], [place] [address]    Report Status PENDING  Incomplete  Expectorated sputum assessment w rflx to resp cult     Status: None   Collection Time: 06/28/18  5:07 AM  Result Value Ref Range  Status   Specimen Description SPU  Final   Special Requests Normal  Final   Sputum evaluation   Final    THIS SPECIMEN IS ACCEPTABLE FOR SPUTUM CULTURE Performed at [facility], [address], [name], [place] [address]    Report Status 06/28/2018 FINAL  Final  Culture, respiratory     Status: None (Preliminary result)   Collection Time: 06/28/18  5:07 AM  Result Value Ref Range Status   Specimen Description   Final    SPU Performed at [facility], [address] [place], [place] [address]    Special Requests   Final    Normal Reflexed from [id number] Performed at [facility], [address], [place], [place] [address]    Gram Stain   Final    FEW SQUAMOUS EPITHELIAL  CELLS PRESENT FEW WBC PRESENT,BOTH PMN AND MONONUCLEAR MODERATE GRAM POSITIVE COCCI IN PAIRS AND CHAINS FEW GRAM NEGATIVE RODS FEW GRAM POSITIVE COCCI IN CLUSTERS FEW GRAM VARIABLE ROD Performed at [facility], [place] [address] [place], [place] [address]    Culture PENDING  Incomplete   Report Status PENDING  Incomplete         Radiology Studies: Dg Chest 2 View  Result Date: 06/27/2018 CLINICAL DATA:  Shortness of breath EXAM: CHEST - 2 VIEW COMPARISON:  02/15/2018 FINDINGS: The heart size and mediastinal contours are within normal limits. Both lungs are clear. The visualized skeletal structures are unremarkable. IMPRESSION: No active cardiopulmonary disease. Electronically Signed   By: Ulyses Jarred M.D.   On: 06/27/2018 14:10        Scheduled Meds: . aspirin EC  81 mg Oral Daily  . atorvastatin  40 mg Oral q1800  . budesonide (PULMICORT) nebulizer solution  0.25 mg Nebulization BID  . buPROPion  150 mg Oral BID  . DULoxetine  60 mg Oral Daily  . enoxaparin (LOVENOX) injection  40 mg Subcutaneous Q24H  . guaiFENesin  600 mg Oral BID  . ipratropium-albuterol  3 mL Nebulization Q6H WA  . isosorbide mononitrate  30 mg Oral Daily  . lisinopril  20 mg Oral Daily  . methylPREDNISolone (SOLU-MEDROL)  injection  40 mg Intravenous Q12H  . metoprolol tartrate  25 mg Oral BID  . pantoprazole  40 mg Oral BID  . tamsulosin  0.4 mg Oral Daily   Continuous Infusions: . lactated ringers 100 mL/hr at 06/28/18 2333  . levofloxacin (LEVAQUIN) IV 500 mg (06/28/18 1439)     LOS: 2 days    Time spent: 30 minutes    Estephania Licciardi Darleen Crocker, DO [facility] Pager [phone]  If 7PM-7AM, please contact night-coverage [email or web address] Password [facility] 06/29/2018, 8:55 AM

## 2018-06-29 NOTE — Chronic Care Management (AMB) (Addendum)
Chronic Care Management    Clinical Social Work General Follow Up Note  06/29/2018 Name: [name] MRN: [id number] DOB: [birth year]  [name] is a 63 y.o. year old male who is a primary care patient of [name], [name], MD. Dr. Livia Snellen  asked the CCM team to consult the patient for assistance with psychosocial assessment, community resources, and counseling support.   Review of patient status, including review of consultants reports, relevant laboratory and other test results, and collaboration with appropriate care team members and the patient's provider was performed as part of comprehensive patient evaluation and provision of chronic care management services.    Depression screen PHQ 2/9 04/11/2018  Decreased Interest 1  Down, Depressed, Hopeless 1  PHQ - 2 Score 2  Altered sleeping 0  Tired, decreased energy 1  Change in appetite 0  Feeling bad or failure about yourself  1  Trouble concentrating 1  Moving slowly or fidgety/restless 0  Suicidal thoughts 0  PHQ-9 Score 5  Difficult doing work/chores -  Some recent data might be hidden     GAD 7 : Generalized Anxiety Score 05/01/2016 03/31/2016  Nervous, Anxious, on Edge 1 3  Control/stop worrying 1 3  Worry too much - different things 1 3  Trouble relaxing 0 2  Restless 0 2  Easily annoyed or irritable 0 0  Afraid - awful might happen 0 0  Total GAD 7 Score 3 13  Anxiety Difficulty Not difficult at all Not difficult at all    Social Determinants of Health:  Decreased social support; little family support; financial challenges, transportation challenges; literacy issues; difficulty understanding his medications and use of medications as prescribed  Goals Addressed            This Visit's Progress   . : " I need to get through all this legal stuff " (pt-stated)       Clinical Goal(s):  Over the next 30 days, patient will affirm attendance to scheduled court hearing and verbalize understanding of any ongoing legal  obligations.   Interventions:  [name] talked with client regarding ongoing client needs around legal issues Confirmed that patient remains in contact with his attorney [name] talked with client about client's last court hearing and date and time of client's next court hearing [name] again reminded client that he could schedule [facility] as needed for attendance at required court hearings.  Patient Self Care Activities:   Attends scheduled medical appointments  Takes medications as prescribed  Communicates with attorney to discuss client's legal issues  Communicates with RNCM to discuss nursing needs of client  Plan:  [name] to call client in 3 weeks to discuss with client legal issues client is facing and to encourage client to work with attorney of client regarding client's legal issues.   Client to call RN CM as needed to discuss nursing needs of client  Client to attend scheduled medical appointments  Client to call office of Dr. Livia Snellen to report medical needs of client       Note: Client had called practice on 06/24/2018 to talk with RN about medical symptoms. [name] took phone to [name] RN on 06/24/2018 and she talked with client about his symptoms. Client shared symptoms with RN [name] on 06/24/2018.  Amy suggested that [facility] call practice on Saturday morning, 06/25/2018 to schedule appointment to be seen.  [name] also talked with [name] on 06/24/2018 and confirmed that he understood that he had to  call on 06/25/2018 to schedule appointment to be seen on 06/25/2018.  Keith Felten said he understood instructions for care from [facility] on 06/24/2018.  On [name] call with client on 06/29/2018, client said he had not called for appointment at practice on 06/25/2018. He said he called 911 later on that weekend and was transported to [facility] for care  Follow Up Plan: [name] to call client in 3 weeks to talk with client about legal issues client is facing and to  encourage client to communicate with his attorney regarding legal issues faced.   [name].Marianna Cid MSW, [name] Licensed Clinical Social Worker [facility] Care Management [phone]

## 2018-06-29 NOTE — Care Management Important Message (Signed)
Important Message  Patient Details  Name: [name] MRN: [id number] Date of Birth: April 08, 1956   Medicare Important Message Given:  Yes    Tommy Medal 06/29/2018, 1:14 PM

## 2018-06-30 LAB — BASIC METABOLIC PANEL
Anion gap: 9 (ref 5–15)
BUN: 15 mg/dL (ref 6–20)
CHLORIDE: 95 mmol/L — AB (ref 98–111)
CO2: 35 mmol/L — AB (ref 22–32)
Calcium: 9.1 mg/dL (ref 8.9–10.3)
Creatinine, Ser: 0.64 mg/dL (ref 0.61–1.24)
GFR calc Af Amer: >60 mL/min (ref >60)
GFR calc non Af Amer: >60 mL/min (ref >60)
Glucose, Bld: 110 mg/dL — ABNORMAL HIGH (ref 70–99)
POTASSIUM: 3.4 mmol/L — AB (ref 3.5–5.1)
Sodium: 139 mmol/L (ref 135–145)

## 2018-06-30 LAB — CBC
HEMATOCRIT: 44.8 % (ref 39.0–52.0)
HEMOGLOBIN: 15.3 g/dL (ref 13.0–17.0)
MCH: 34.3 pg — ABNORMAL HIGH (ref 26.0–34.0)
MCHC: 34.2 g/dL (ref 30.0–36.0)
MCV: 100.4 fL — AB (ref 80.0–100.0)
Platelets: 157 10*3/uL (ref 150–400)
RBC: 4.46 MIL/uL (ref 4.22–5.81)
RDW: 13 % (ref 11.5–15.5)
WBC: 8.9 10*3/uL (ref 4.0–10.5)
nRBC: 0 % (ref 0.0–0.2)

## 2018-06-30 LAB — CULTURE, RESPIRATORY W GRAM STAIN
Culture: NORMAL
Special Requests: NORMAL

## 2018-06-30 MED ORDER — PREDNISONE 20 MG PO TABS: 40.0000 mg | ORAL_TABLET | Freq: Every day | ORAL | 0 refills | Status: AC

## 2018-06-30 MED ORDER — IPRATROPIUM-ALBUTEROL 20-100 MCG/ACT IN AERS: 1.0000 | INHALATION_SPRAY | Freq: Four times a day (QID) | RESPIRATORY_TRACT | 0 refills | Status: DC

## 2018-06-30 MED ORDER — LEVOFLOXACIN 500 MG PO TABS: 500.0000 mg | ORAL_TABLET | Freq: Every day | ORAL | 0 refills | Status: AC

## 2018-06-30 MED ORDER — GUAIFENESIN ER 600 MG PO TB12: 600.0000 mg | ORAL_TABLET | Freq: Two times a day (BID) | ORAL | 0 refills | Status: AC

## 2018-06-30 NOTE — Progress Notes (Signed)
SATURATION QUALIFICATIONS: (This note is used to comply with regulatory documentation for home oxygen)  Patient Saturations on Room Air at Rest = 94%  Patient Saturations on Room Air while Ambulating = 95%  Patient Saturations on 0 Liters of oxygen while Ambulating = N/A  Please briefly explain why patient needs home oxygen:

## 2018-06-30 NOTE — Discharge Summary (Signed)
Physician Discharge Summary  [name] YIR:[id number] DOB: April 15, 1956 DOA: 06/27/2018  PCP: [name], MD  Admit date: 06/27/2018  Discharge date: 06/30/2018  Admitted From:Home  Disposition:  Home  Recommendations for Outpatient Follow-up:  1. Follow up with PCP in 1-2 weeks 2. Continue on prednisone and Levaquin as prescribed to finish course of treatment  Home Health: Yes with PT  Equipment/Devices: Rolling walker  Discharge Condition: Stable  CODE STATUS: Full  Diet recommendation: Heart Healthy  Brief/Interim Summary: Per HPI: 63 year old male with a history of coronary artery disease, hypertension, tobacco use, COPD, admitted to the hospital with shortness of breath, cough and wheezing. Found to have COPD exacerbation likely precipitated by underlying bronchitis. Admitted for IV steroids, bronchodilators and antibiotics.  Patient has done quite well during the course of this treatment and his COPD exacerbation is resolved.  He will continue further treatment at home with prednisone and Levaquin to finish course of treatment.  He has been evaluated by PT and is noted to need some home health physical therapy as well as rolling walker.  He does not appear to need any oxygen at home.  No other acute events noted during this course of admission.  Stable for discharge at this time.  Discharge Diagnoses:  Principal Problem:   Acute bronchitis with bronchospasm Active Problems:   Essential hypertension   CAD (coronary artery disease), native coronary artery   Hypokalemia   Shortness of breath   COPD with acute exacerbation ([place])  Principal discharge diagnosis: Acute COPD exacerbation likely secondary to bronchitis.  Discharge Instructions  Discharge Instructions    Diet - low sodium heart healthy   Complete by:  As directed    Increase activity slowly   Complete by:  As directed      Allergies as of 06/30/2018      Reactions   Chantix [varenicline Tartrate]  Nausea And Vomiting      Medication List    STOP taking these medications   amoxicillin-clavulanate 875-125 MG tablet Commonly known as:  AUGMENTIN     TAKE these medications   acetaminophen 325 MG tablet Commonly known as:  TYLENOL Take 2 tablets (650 mg total) by mouth every 6 (six) hours as needed for mild pain or headache.   albuterol 108 (90 Base) MCG/ACT inhaler Commonly known as:  ProAir HFA 1-2 puffs every 6 hours as needed wheezing or shortness of breath.   aspirin EC 81 MG tablet Take 1 tablet (81 mg total) by mouth daily.   atorvastatin 40 MG tablet Commonly known as:  LIPITOR Take 1 tablet (40 mg total) by mouth daily at 6 PM.   buPROPion 150 MG 12 hr tablet Commonly known as:  Wellbutrin SR Take 1 tablet (150 mg total) by mouth 2 (two) times daily.   DULoxetine 30 MG capsule Commonly known as:  CYMBALTA Take 2 capsules (60 mg total) by mouth daily. WITH A FULL STOMACH AT SUPPER TIME   fexofenadine 180 MG tablet Commonly known as:  ALLEGRA Take 1 tablet (180 mg total) by mouth daily. For allergy symptoms   fluticasone 50 MCG/ACT nasal spray Commonly known as:  FLONASE Place 2 sprays into both nostrils daily. What changed:    when to take this  reasons to take this   fluticasone furoate-vilanterol 100-25 MCG/INH Aepb Commonly known as:  Breo Ellipta Inhale 1 puff into the lungs daily. What changed:    how much to take  how to take this  when to take  this   guaiFENesin 600 MG 12 hr tablet Commonly known as:  MUCINEX Take 1 tablet (600 mg total) by mouth 2 (two) times daily for 10 days.   Ipratropium-Albuterol 20-100 MCG/ACT Aers respimat Commonly known as:  Combivent Respimat Inhale 1 puff into the lungs every 6 (six) hours for 30 days.   isosorbide mononitrate 30 MG 24 hr tablet Commonly known as:  IMDUR Take 1 tablet (30 mg total) by mouth daily.   levofloxacin 500 MG tablet Commonly known as:  Levaquin Take 1 tablet (500 mg total)  by mouth daily for 4 days.   lisinopril 20 MG tablet Commonly known as:  PRINIVIL,ZESTRIL Take 1 tablet (20 mg total) by mouth daily.   metoprolol tartrate 25 MG tablet Commonly known as:  LOPRESSOR Take 1 tablet (25 mg total) by mouth 2 (two) times daily.   nitroGLYCERIN 0.4 MG SL tablet Commonly known as:  NITROSTAT Place 1 tablet (0.4 mg total) under the tongue every 5 (five) minutes x 3 doses as needed for chest pain.   pantoprazole 40 MG tablet Commonly known as:  PROTONIX Take 1 tablet (40 mg total) by mouth 2 (two) times daily.   predniSONE 20 MG tablet Commonly known as:  Deltasone Take 2 tablets (40 mg total) by mouth daily for 5 days.   tamsulosin 0.4 MG Caps capsule Commonly known as:  FLOMAX TAKE (1) CAPSULE DAILY What changed:    how much to take  how to take this  when to take this            Durable Medical Equipment  (From admission, onward)         Start     Ordered   06/30/18 1303  For home use only DME Walker rolling  [facility]  Once    Comments:  5 in wheels.  Question:  Patient needs a walker to treat with the following condition  Answer:  Weakness   06/30/18 1303         Follow-up Information    [name], MD Follow up in 1 week(s).   Specialty:  Family Medicine Contact information: [place] [address] [phone]        Minus Breeding, MD .   Specialty:  Cardiology Contact information: [address] [place] [address] [phone]          Allergies  Allergen Reactions  . Chantix [Varenicline Tartrate] Nausea And Vomiting    Consultations:  None   Procedures/Studies: Dg Chest 2 View  Result Date: 06/27/2018 CLINICAL DATA:  Shortness of breath EXAM: CHEST - 2 VIEW COMPARISON:  02/15/2018 FINDINGS: The heart size and mediastinal contours are within normal limits. Both lungs are clear. The visualized skeletal structures are unremarkable. IMPRESSION: No active cardiopulmonary  disease. Electronically Signed   By: Ulyses Jarred M.D.   On: 06/27/2018 14:10     Discharge Exam: Vitals:   06/30/18 0544 06/30/18 0734  BP: (!) 166/99   Pulse: (!) 57   Resp: 18   Temp: 98.2 F (36.8 C)   SpO2: 92% 92%   Vitals:   06/29/18 2111 06/30/18 0500 06/30/18 0544 06/30/18 0734  BP: (!) 143/87  (!) 166/99   Pulse: 72  (!) 57   Resp: 18  18   Temp: 98.3 F (36.8 C)  98.2 F (36.8 C)   TempSrc: Oral  Oral   SpO2: 93%  92% 92%  Weight:  98.4 kg    Height:  General: Pt is alert, awake, not in acute distress Cardiovascular: RRR, S1/S2 +, no rubs, no gallops Respiratory: CTA bilaterally, no wheezing, no rhonchi Abdominal: Soft, NT, ND, bowel sounds + Extremities: no edema, no cyanosis    The results of significant diagnostics from this hospitalization (including imaging, microbiology, ancillary and laboratory) are listed below for reference.     Microbiology: Recent Results (from the past 240 hour(s))  Blood Culture (routine x 2)     Status: None (Preliminary result)   Collection Time: 06/27/18 12:35 PM  Result Value Ref Range Status   Specimen Description BLOOD RIGHT HAND  Final   Special Requests   Final    BOTTLES DRAWN AEROBIC AND ANAEROBIC Blood Culture adequate volume   Culture   Final    NO GROWTH 3 DAYS Performed at [facility], [address], [place], [place] [address]    Report Status PENDING  Incomplete  Blood Culture (routine x 2)     Status: None (Preliminary result)   Collection Time: 06/27/18 12:39 PM  Result Value Ref Range Status   Specimen Description BLOOD LEFT ANTECUBITAL  Final   Special Requests   Final    BOTTLES DRAWN AEROBIC AND ANAEROBIC Blood Culture adequate volume   Culture   Final    NO GROWTH 3 DAYS Performed at [facility], [address] [place], [place] [address]    Report Status PENDING  Incomplete  Expectorated sputum assessment w rflx to resp cult     Status: None   Collection Time: 06/28/18  5:07 AM   Result Value Ref Range Status   Specimen Description SPU  Final   Special Requests Normal  Final   Sputum evaluation   Final    THIS SPECIMEN IS ACCEPTABLE FOR SPUTUM CULTURE Performed at [facility], [address], [place], [place] [address]    Report Status 06/28/2018 FINAL  Final  Culture, respiratory     Status: None (Preliminary result)   Collection Time: 06/28/18  5:07 AM  Result Value Ref Range Status   Specimen Description   Final    SPU Performed at [facility], [address], [place], [place] [address]    Special Requests   Final    Normal Reflexed from [id number] Performed at [facility], [address] [place], [place] [address]    Gram Stain   Final    FEW SQUAMOUS EPITHELIAL CELLS PRESENT FEW WBC PRESENT,BOTH PMN AND MONONUCLEAR MODERATE GRAM POSITIVE COCCI IN PAIRS AND CHAINS FEW GRAM NEGATIVE RODS FEW GRAM POSITIVE COCCI IN CLUSTERS FEW GRAM VARIABLE ROD    Culture   Final    MODERATE Consistent with normal respiratory flora. Performed at [facility], [place] [address], [place], [place] [address]    Report Status PENDING  Incomplete     Labs: BNP (last 3 results) No results for input(s): BNP in the last 8760 hours. Basic Metabolic Panel: Recent Labs  Lab 06/27/18 1245 06/28/18 0433 06/30/18 0429  NA 136 138 139  K 3.2* 4.0 3.4*  CL 94* 100 95*  CO2 30 29 35*  GLUCOSE 110* 119* 110*  BUN 15 12 15   CREATININE 0.89 0.67 0.64  CALCIUM 8.4* 8.7* 9.1  MG 1.9 2.0  --    Liver Function Tests: Recent Labs  Lab 06/27/18 1245  AST 32  ALT 21  ALKPHOS 67  BILITOT 1.0  PROT 7.5  ALBUMIN 3.9   No results for input(s): LIPASE, AMYLASE in the last 168 hours.  No results for input(s): AMMONIA in the last 168 hours. CBC: Recent Labs  Lab 06/27/18 1245 06/28/18 0433 06/30/18 0429  WBC 7.5 4.9 8.9  NEUTROABS 4.3  --   --   HGB 16.3 14.9 15.3  HCT 46.7 43.4 44.8  MCV 97.3 97.3 100.4*  PLT 168 160 157   Cardiac Enzymes: No results for  input(s): CKTOTAL, CKMB, CKMBINDEX, TROPONINI in the last 168 hours. BNP: Invalid input(s): POCBNP CBG: No results for input(s): GLUCAP in the last 168 hours. D-Dimer No results for input(s): DDIMER in the last 72 hours. Hgb A1c No results for input(s): HGBA1C in the last 72 hours. Lipid Profile No results for input(s): CHOL, HDL, LDLCALC, TRIG, CHOLHDL, LDLDIRECT in the last 72 hours. Thyroid function studies No results for input(s): TSH, T4TOTAL, T3FREE, THYROIDAB in the last 72 hours.  Invalid input(s): FREET3 Anemia work up No results for input(s): VITAMINB12, FOLATE, FERRITIN, TIBC, IRON, RETICCTPCT in the last 72 hours. Urinalysis    Component Value Date/Time   COLORURINE YELLOW 06/27/2018 1215   APPEARANCEUR CLEAR 06/27/2018 1215   LABSPEC 1.017 06/27/2018 1215   PHURINE 5.0 06/27/2018 1215   GLUCOSEU NEGATIVE 06/27/2018 1215   HGBUR NEGATIVE 06/27/2018 1215   Colony 06/27/2018 1215   KETONESUR NEGATIVE 06/27/2018 1215   PROTEINUR 100 (A) 06/27/2018 1215   UROBILINOGEN 0.2 09/01/2009 0534   NITRITE NEGATIVE 06/27/2018 1215   LEUKOCYTESUR NEGATIVE 06/27/2018 1215   Sepsis Labs Invalid input(s): PROCALCITONIN,  WBC,  LACTICIDVEN Microbiology Recent Results (from the past 240 hour(s))  Blood Culture (routine x 2)     Status: None (Preliminary result)   Collection Time: 06/27/18 12:35 PM  Result Value Ref Range Status   Specimen Description BLOOD RIGHT HAND  Final   Special Requests   Final    BOTTLES DRAWN AEROBIC AND ANAEROBIC Blood Culture adequate volume   Culture   Final    NO GROWTH 3 DAYS Performed at [facility], [address] [place], [place] [address]    Report Status PENDING  Incomplete  Blood Culture (routine x 2)     Status: None (Preliminary result)   Collection Time: 06/27/18 12:39 PM  Result Value Ref Range Status   Specimen Description BLOOD LEFT ANTECUBITAL  Final   Special Requests   Final    BOTTLES DRAWN AEROBIC AND  ANAEROBIC Blood Culture adequate volume   Culture   Final    NO GROWTH 3 DAYS Performed at [facility], [address] [place], [place] [address]    Report Status PENDING  Incomplete  Expectorated sputum assessment w rflx to resp cult     Status: None   Collection Time: 06/28/18  5:07 AM  Result Value Ref Range Status   Specimen Description SPU  Final   Special Requests Normal  Final   Sputum evaluation   Final    THIS SPECIMEN IS ACCEPTABLE FOR SPUTUM CULTURE Performed at [facility], [address] [place], [place] [address]    Report Status 06/28/2018 FINAL  Final  Culture, respiratory     Status: None (Preliminary result)   Collection Time: 06/28/18  5:07 AM  Result Value Ref Range Status   Specimen Description   Final    SPU Performed at [facility], [address], [place], [place] [address]    Special Requests   Final    Normal Reflexed from [id number] Performed at [facility], [address], [place],  [address]    Gram Stain  Final    FEW SQUAMOUS EPITHELIAL CELLS PRESENT FEW WBC PRESENT,BOTH PMN AND MONONUCLEAR MODERATE GRAM POSITIVE COCCI IN PAIRS AND CHAINS FEW GRAM NEGATIVE RODS FEW GRAM POSITIVE COCCI IN CLUSTERS FEW GRAM VARIABLE ROD    Culture   Final    MODERATE Consistent with normal respiratory flora. Performed at [facility], [place] [address] [place], [place] [address]    Report Status PENDING  Incomplete     Time coordinating discharge: 35 minutes  SIGNED:   Rodena Goldmann, DO [facility] 06/30/2018, 1:04 PM  If 7PM-7AM, please contact night-coverage [email or web address] Password [email or web address]

## 2018-06-30 NOTE — Evaluation (Signed)
Physical Therapy Evaluation Patient Details Name: [name] MRN: [id number] DOB: April 30, 1955 Today's Date: 06/30/2018   History of Present Illness  [name] is a 63 y.o. male with medical history significant for coronary artery disease status post left heart catheterization in March 2019 revealing 70% stenosis of proximal LAD, hypertension, tobacco abuse, allergic rhinitis, who is admitted to [facility] on 06/27/2018 with acute bronchitis with bronchospasm after presenting from home to [facility] ED complaining of cough.     Clinical Impression  Patient presents supine in bed with RN in room and is agreeable to therapy. Patient near baseline for functional mobility and gait, slightly limited secondary to generalized weakness and fatigue with activity. Pt requires min guard for bed mobility due to unsteadiness and assistance to remove blanket from BLE. Pt with initial unsteadiness upon standing requiring assistance to steady self using RW. Pt requiring verbal cues with transfers for hand placement and RW usage. Pt ambulates with RW on room air and O2 sat improves to 92% from 89% initially at rest. Pt denies home O2 use, but baseline is 89% when supine in bed, improves to 91% with verbal cues for pursed lip breathing with 1-2 breaths and improves to 92% with gait training. Pt limited secondary to fatigue. Pt left in bed with call bell in reach and bed alarm on. RN notified of pt's O2 sat on room air and reports to maintain pt on room air and end of session so nursing can assess pt's O2 sat again. Patient will benefit from continued physical therapy in hospital and recommended venue below to increase strength, balance, endurance for safe ADLs and gait.     Follow Up Recommendations Home health PT;Supervision - Intermittent    Equipment Recommendations  Rolling walker with 5" wheels    Recommendations for Other Services       Precautions / Restrictions  Precautions Precautions: Fall Restrictions Weight Bearing Restrictions: No      Mobility  Bed Mobility Overal bed mobility: Needs Assistance Bed Mobility: Supine to Sit;Sit to Supine     Supine to sit: Min guard Sit to supine: Min guard   General bed mobility comments: increased time, use of bed rail  Transfers Overall transfer level: Needs assistance Equipment used: Rolling walker (2 wheeled) Transfers: Sit to/from [facility] Sit to Stand: Min guard Stand pivot transfers: Min guard       General transfer comment: initial unsteadiness upon standing that resolves with time, verbal cues for hand placement, rocking momentum to rise  Ambulation/Gait Ambulation/Gait assistance: Min guard Gait Distance (Feet): 40 Feet Assistive device: Rolling walker (2 wheeled) Gait Pattern/deviations: Step-through pattern;Decreased step length - right;Decreased step length - left;Decreased stride length Gait velocity: decreased   General Gait Details: decreased step length, initial unsteadiness requiring min assist to prevent loss of balance, flexed knees throughout gait cycle, limited secondary to c/o fatigue, on room air and O2 sat 92%  Stairs            Wheelchair Mobility    Modified Rankin (Stroke Patients Only)       Balance Overall balance assessment: Needs assistance Sitting-balance support: Feet supported;No upper extremity supported Sitting balance-Leahy Scale: Fair Sitting balance - Comments: seated EOB   Standing balance support: During functional activity;Bilateral upper extremity supported Standing balance-Leahy Scale: Fair Standing balance comment: with RW  Pertinent Vitals/Pain Pain Assessment: No/denies pain    Home Living Family/patient expects to be discharged to:: Private residence Living Arrangements: Alone Available Help at Discharge: Family Type of Home: Mobile home Home Access: Stairs to  enter Entrance Stairs-Rails: None Entrance Stairs-Number of Steps: 1 Home Layout: One level Home Equipment: Cane - single point      Prior Function Level of Independence: Independent with assistive device(s)         Comments: Pt reports being Ind with all ADLs, ambulates community distances with SPC, and sister provides transportation into community     Hand Dominance        Extremity/Trunk Assessment        Lower Extremity Assessment Lower Extremity Assessment: Generalized weakness    Cervical / Trunk Assessment Cervical / Trunk Assessment: Normal  Communication   Communication: No difficulties  Cognition Arousal/Alertness: Awake/alert Behavior During Therapy: WFL for tasks assessed/performed Overall Cognitive Status: Within Functional Limits for tasks assessed                                        General Comments      Exercises     Assessment/Plan    PT Assessment Patient needs continued PT services  PT Problem List Decreased strength;Decreased activity tolerance;Decreased balance;Decreased mobility       PT Treatment Interventions Gait training;Stair training;Functional mobility training;Therapeutic activities;Therapeutic exercise;Balance training;Patient/family education    PT Goals (Current goals can be found in the Care Plan section)  Acute Rehab PT Goals Patient Stated Goal: return home with sister to assist PT Goal Formulation: With patient Time For Goal Achievement: 07/07/18 Potential to Achieve Goals: Good    Frequency Min 2X/week   Barriers to discharge        Co-evaluation               AM-PAC PT "6 Clicks" Mobility  Outcome Measure Help needed turning from your back to your side while in a flat bed without using bedrails?: A Little Help needed moving from lying on your back to sitting on the side of a flat bed without using bedrails?: A Little Help needed moving to and from a bed to a chair (including a  wheelchair)?: A Little Help needed standing up from a chair using your arms (e.g., wheelchair or bedside chair)?: A Little Help needed to walk in hospital room?: A Little Help needed climbing 3-5 steps with a railing? : A Little 6 Click Score: 18    End of Session   Activity Tolerance: Patient tolerated treatment well;Patient limited by fatigue Patient left: in bed;with call bell/phone within reach;with bed alarm set Nurse Communication: Mobility status PT Visit Diagnosis: Unsteadiness on feet (R26.81);Other abnormalities of gait and mobility (R26.89);Muscle weakness (generalized) (M62.81)    Time: 2119-4174 PT Time Calculation (min) (ACUTE ONLY): 15 min   Charges:   PT Evaluation $PT Eval Moderate Complexity: 1 Mod PT Treatments $Therapeutic Activity: 8-22 mins        12:16 PM, 06/30/18 Talbot Grumbling, DPT Physical Therapist with [facility] [phone] office

## 2018-06-30 NOTE — Plan of Care (Signed)
  Problem: Acute Rehab PT Goals(only PT should resolve) Goal: Pt Will Go Supine/Side To Sit Flowsheets (Taken 06/30/2018 1217) Pt will go Supine/Side to Sit: with modified independence Goal: Pt Will Go Sit To Supine/Side Flowsheets (Taken 06/30/2018 1217) Pt will go Sit to Supine/Side: with modified independence Goal: Patient Will Transfer Sit To/From Stand Flowsheets (Taken 06/30/2018 1217) Patient will transfer sit to/from stand: with modified independence Note:  With [facility] or RW Goal: Pt Will Transfer Bed To Chair/Chair To Bed Flowsheets (Taken 06/30/2018 1217) Pt will Transfer Bed to Chair/Chair to Bed: with modified independence Note:  With SPC or RW Goal: Pt Will Ambulate Flowsheets (Taken 06/30/2018 1217) Pt will Ambulate: > 125 feet; with supervision; with least restrictive assistive device   12:18 PM, 06/30/18 Talbot Grumbling, DPT Physical Therapist with [facility] [phone] office

## 2018-06-30 NOTE — TOC Transition Note (Addendum)
Transition of Care [facility]) - CM/SW Discharge Note   Patient Details  Name: [name] MRN: [id number] Date of Birth: Aug 06, 1955  Transition of Care [facility]) CM/SW Contact:  Sharika Mosquera, Chauncey Reading, RN Phone Number: 06/30/2018, 1:04 PM   Clinical Narrative:    Patient from home, mostly independent. Has an aide at home. Walks with cane. Recommended for home health PT. Went over [employer] of agencies. Patient elects [facility]. Georgina Snell of [place] notified. Declines RW. States it will not fit in his mobile home, prefers to use his cane.    Final next level of care: [place] Barriers to Discharge: No Barriers Identified   Patient Goals and CMS Choice Patient states their goals for this hospitalization and ongoing recovery are:: wants to go home with home health and get back to his normal "get my strenght back" CMS Medicare.gov Compare Post Acute Care list provided to:: Patient Choice offered to / list presented to : Patient             Discharge Plan and Services Discharge Planning Services: CM Consult Post Acute Care Choice: Home Health              HH Arranged: PT [place]: [place]        Readmission Risk Interventions No flowsheet data found.

## 2018-06-30 NOTE — Progress Notes (Signed)
Removed both IVs-clean, dry, intact. Reviewed d/c paperwork with patient and friend.Reviewed new medications and where to pick up. Answered all questions. Patient was waiting for Home health to deliver walker but he asked that the walker be brought to his house because his ride was leaving

## 2018-07-02 LAB — CULTURE, BLOOD (ROUTINE X 2)
Culture: NO GROWTH
Culture: NO GROWTH
SPECIAL REQUESTS: ADEQUATE
Special Requests: ADEQUATE

## 2018-07-03 DIAGNOSIS — I2510 Atherosclerotic heart disease of native coronary artery without angina pectoris: Secondary | ICD-10-CM | POA: Diagnosis not present

## 2018-07-03 DIAGNOSIS — I119 Hypertensive heart disease without heart failure: Secondary | ICD-10-CM | POA: Diagnosis not present

## 2018-07-03 DIAGNOSIS — J449 Chronic obstructive pulmonary disease, unspecified: Secondary | ICD-10-CM | POA: Diagnosis not present

## 2018-07-03 DIAGNOSIS — Z7951 Long term (current) use of inhaled steroids: Secondary | ICD-10-CM | POA: Diagnosis not present

## 2018-07-03 DIAGNOSIS — J209 Acute bronchitis, unspecified: Secondary | ICD-10-CM | POA: Diagnosis not present

## 2018-07-04 ENCOUNTER — Telehealth: Payer: Self-pay | Admitting: *Deleted

## 2018-07-04 ENCOUNTER — Telehealth: Payer: Self-pay | Admitting: Family Medicine

## 2018-07-04 ENCOUNTER — Ambulatory Visit: Payer: Medicare Other | Admitting: *Deleted

## 2018-07-04 ENCOUNTER — Other Ambulatory Visit: Payer: Self-pay

## 2018-07-04 DIAGNOSIS — J441 Chronic obstructive pulmonary disease with (acute) exacerbation: Secondary | ICD-10-CM | POA: Diagnosis not present

## 2018-07-04 DIAGNOSIS — F411 Generalized anxiety disorder: Secondary | ICD-10-CM

## 2018-07-04 DIAGNOSIS — I10 Essential (primary) hypertension: Secondary | ICD-10-CM | POA: Diagnosis not present

## 2018-07-04 DIAGNOSIS — E785 Hyperlipidemia, unspecified: Secondary | ICD-10-CM | POA: Diagnosis not present

## 2018-07-04 DIAGNOSIS — I25118 Atherosclerotic heart disease of native coronary artery with other forms of angina pectoris: Secondary | ICD-10-CM

## 2018-07-04 DIAGNOSIS — G809 Cerebral palsy, unspecified: Secondary | ICD-10-CM

## 2018-07-04 NOTE — Chronic Care Management (AMB) (Signed)
  Chronic Care Management   Follow Up Note   07/04/2018 Name: [name] MRN: [id number] DOB: 02-May-1955  Referred by: James Fraise, MD Reason for referral : Chronic Care Management (telephone follow up s/p hosp discharge)   [name] is a 63 y.o. year old male who is a primary care patient of [name], [name], MD. The CCM team was consulted for assistance with chronic disease management and care coordination needs.    [name] was discharged from the hospital last week with a diagnosis of COPD exacerbation likely secondary to bronchitis. Telephone call today was made in conjunction with James Cobbs, [name]. [name] primary concern is that he has transportation arranged for his visit with [name] on 07/06/2018.  [name] sounded more excited/agitated that normal. His speech was fast and he repeated the same thought/phrase several times throughout the conversation. He was not happy with being hospitalized and needs more education regarding COPD and COPD exacerbations.     Goals Addressed    . "I need to get a ride to my doctor's appointment on 07/06/2018" (pt-stated)       Current Barriers:  . Lacks caregiver support.  . Film/video editor.  . Literacy barriers . Transportation barriers . Cognitive Deficits  Nurse Case Manager Clinical Goal(s):  [name] Over the next 2 days, patient will work with CM clinical social worker to arrange transportation to hospital follow up appointment . Over the next 2 days, patient will work with Museum/gallery conservator with [facility] (community agency) to arrange transportation with [name]   Interventions:  . Collaborated with James [name], [name] regarding transportation  Patient Self Care Activities:  . Currently UNABLE TO independently drive, read, or write . Self administers medications as prescribed     Plan  The CM team will reach out to the patient again over the next 30 days.    RNCM will review patient goals at next visit and together will form a  COPD action plan.    James Sicilian, RN-BC, BSN Nurse Case Manager [place] [phone]

## 2018-07-04 NOTE — Patient Outreach (Signed)
[facility]) Care Management  07/04/2018  Tavita Eastham Bramblett [birth year] [id number]   Request received from CSW, [name], to arrange transportation to appointment with Dr. [name] on 07/06/18 @ 1:40 PM.  Transportation arranged via [place], [place] Worker [phone]

## 2018-07-04 NOTE — Patient Instructions (Signed)
Visit Information  Goals Addressed    . "I need to get a ride to my doctor's appointment on 07/06/2018" (pt-stated)       Current Barriers:  . Lacks caregiver support.  . Film/video editor.  . Literacy barriers . Transportation barriers . Cognitive Deficits  Nurse Case Manager Clinical Goal(s):  [name] Over the next 2 days, patient will work with CM clinical social worker to arrange transportation to hospital follow up appointment . Over the next 2 days, patient will work with Museum/gallery conservator with [facility] (community agency) to arrange transportation with [facility]   Interventions:  . Collaborated with [name], [name] regarding transportation  Patient Self Care Activities:  . Currently UNABLE TO independently drive, read, or write . Self administers medications as prescribed        The patient verbalized understanding of instructions provided today and declined a print copy of patient instruction materials.   The CM team will reach out to the patient again over the next 30 days.    [name], RN-BC, BSN Nurse Case Manager [place] [phone]

## 2018-07-05 ENCOUNTER — Telehealth: Payer: Self-pay | Admitting: *Deleted

## 2018-07-05 NOTE — Telephone Encounter (Signed)
[facility] BSW Amber Chrismon is working with [facility] to arrange transport for client for 3/18 appointments. Thanks scott Jazzie Trampe

## 2018-07-05 NOTE — Telephone Encounter (Signed)
FYI from PT w/ [facility] Pt reported falling in his living room, just has soreness on his left side, no injury

## 2018-07-06 ENCOUNTER — Other Ambulatory Visit: Payer: Self-pay

## 2018-07-06 ENCOUNTER — Encounter: Payer: Self-pay | Admitting: Family Medicine

## 2018-07-06 ENCOUNTER — Ambulatory Visit (INDEPENDENT_AMBULATORY_CARE_PROVIDER_SITE_OTHER): Payer: Medicare Other | Admitting: Family Medicine

## 2018-07-06 VITALS — BP 92/59 | HR 89 | Temp 98.8°F | Ht 72.0 in | Wt 220.0 lb

## 2018-07-06 DIAGNOSIS — J439 Emphysema, unspecified: Secondary | ICD-10-CM

## 2018-07-06 DIAGNOSIS — J449 Chronic obstructive pulmonary disease, unspecified: Secondary | ICD-10-CM | POA: Diagnosis not present

## 2018-07-06 DIAGNOSIS — I119 Hypertensive heart disease without heart failure: Secondary | ICD-10-CM | POA: Diagnosis not present

## 2018-07-06 DIAGNOSIS — J209 Acute bronchitis, unspecified: Secondary | ICD-10-CM | POA: Diagnosis not present

## 2018-07-06 DIAGNOSIS — I10 Essential (primary) hypertension: Secondary | ICD-10-CM | POA: Diagnosis not present

## 2018-07-06 DIAGNOSIS — Z7951 Long term (current) use of inhaled steroids: Secondary | ICD-10-CM | POA: Diagnosis not present

## 2018-07-06 DIAGNOSIS — I2510 Atherosclerotic heart disease of native coronary artery without angina pectoris: Secondary | ICD-10-CM | POA: Diagnosis not present

## 2018-07-06 DIAGNOSIS — I25118 Atherosclerotic heart disease of native coronary artery with other forms of angina pectoris: Secondary | ICD-10-CM | POA: Diagnosis not present

## 2018-07-06 NOTE — Telephone Encounter (Signed)
Transitional Care Management  Telephone Note  07/04/2018  [name] is a primary care patient of [name], MD who was discharged from a hospital or long-term care facility over the past two business days.    An outgoing telephone call was made and I spoke with the patient,  An appointment for Transitional Care Management is scheduled on 07/06/2018 with Dr Livia Snellen.  The patient or caregiver is knowledgeable of his/her condition(s) and treatment: Yes but he has some cognitive deficits that affect his memory and processing.    DISCHARGE INFORMATION Date of Discharge:06/30/2018  Discharge Facility: [name]  Principal Discharge Diagnosis: COPD Exacerbation  Outpatient Follow Up Recommendations (from discharge summary) 1. Follow up with PCP in 1-2 weeks 2. Continue on prednisone and Levaquin as prescribed to finish course of treatment  MEDICATION RECONCILIATION All of the medications prescribed at discharge have been picked up.   A post discharge medication reconciliation was performed and the complete medication list was reviewed with the patient/caregiver and is current as of 07/06/2018.   Current Medication List Outpatient Encounter Medications as of 07/04/2018  Medication Sig  . acetaminophen (TYLENOL) 325 MG tablet Take 2 tablets (650 mg total) by mouth every 6 (six) hours as needed for mild pain or headache.  . albuterol (PROAIR HFA) 108 (90 Base) MCG/ACT inhaler 1-2 puffs every 6 hours as needed wheezing or shortness of breath.  [name] aspirin 81 MG tablet Take 1 tablet (81 mg total) by mouth daily.  [name] atorvastatin (LIPITOR) 40 MG tablet Take 1 tablet (40 mg total) by mouth daily at 6 PM.  . buPROPion (WELLBUTRIN SR) 150 MG 12 hr tablet Take 1 tablet (150 mg total) by mouth 2 (two) times daily.  . DULoxetine (CYMBALTA) 30 MG capsule Take 2 capsules (60 mg total) by mouth daily. WITH A FULL STOMACH AT SUPPER TIME  . fexofenadine (ALLEGRA) 180 MG tablet Take 1 tablet (180 mg  total) by mouth daily. For allergy symptoms  . fluticasone (FLONASE) 50 MCG/ACT nasal spray Place 2 sprays into both nostrils daily. (Patient taking differently: Place 2 sprays into both nostrils daily as needed for allergies or rhinitis. )  . fluticasone furoate-vilanterol (BREO ELLIPTA) 100-25 MCG/INH AEPB Inhale 1 puff into the lungs daily. (Patient taking differently: Inhale 1 puff into the lungs daily. Inhale 1 puff into the lungs daily.)  . guaiFENesin (MUCINEX) 600 MG 12 hr tablet Take 1 tablet (600 mg total) by mouth 2 (two) times daily for 10 days.  . Ipratropium-Albuterol (COMBIVENT RESPIMAT) 20-100 MCG/ACT AERS respimat Inhale 1 puff into the lungs every 6 (six) hours for 30 days.  . isosorbide mononitrate (IMDUR) 30 MG 24 hr tablet Take 1 tablet (30 mg total) by mouth daily.  . [EXPIRED] levofloxacin (LEVAQUIN) 500 MG tablet Take 1 tablet (500 mg total) by mouth daily for 4 days.  [name] lisinopril (PRINIVIL,ZESTRIL) 20 MG tablet Take 1 tablet (20 mg total) by mouth daily.  . metoprolol tartrate (LOPRESSOR) 25 MG tablet Take 1 tablet (25 mg total) by mouth 2 (two) times daily.  . nitroGLYCERIN (NITROSTAT) 0.4 MG SL tablet Place 1 tablet (0.4 mg total) under the tongue every 5 (five) minutes x 3 doses as needed for chest pain.  . pantoprazole (PROTONIX) 40 MG tablet Take 1 tablet (40 mg total) by mouth 2 (two) times daily.  . [EXPIRED] predniSONE (DELTASONE) 20 MG tablet Take 2 tablets (40 mg total) by mouth daily for 5 days.  . tamsulosin (FLOMAX) 0.4 MG CAPS  capsule TAKE (1) CAPSULE DAILY (Patient taking differently: Take 0.4 mg by mouth every morning. TAKE (1) CAPSULE DAILY)   No facility-administered encounter medications on file as of 07/04/2018.       ACTIVITIES OF DAILY LIVING  Patient is not able to perform ADLs independently and this is is not a change from baseline.  The primary caregiver is: self and has someone come in daily to provide [place].   Patient is  receiving home health services: Yes . PT with [facility]   PATIENT EDUCATION . Take all medications as prescribed . Contact our office sooner than your scheduled appointment if you have any questions or concerns    [name], RN-BC, BSN Nurse Case Manager [place] Ph: [phone]

## 2018-07-06 NOTE — Progress Notes (Signed)
Subjective:  Patient ID: [name], male    DOB: 1955-12-18  Age: 63 y.o. MRN: [id number]  CC: Hospitalization Follow-up   HPI [name] presents for transition of care from hospital admission of March 9 through March 12.  He lost his balance yesterday and fell in his home.  We were notified about that by phone yesterday.  Today he tells me that his left side feels a little sore but he has full range of motion.  He has had no further falling or loss of balance.  Physical therapy has been ordered to work with him on strengthening and balance.  Patient brings in his bottle of prednisone which is empty and a bottle of levofloxacin which only has 2 pills left.  It appears that he is taking them appropriately.  He says he is no longer coughing.  He is not short of breath.  He does continue to smoke.  Depression screen [facility] 2/9 07/06/2018 04/11/2018 03/28/2018  Decreased Interest 0 1 0  Down, Depressed, Hopeless 0 1 0  PHQ - 2 Score 0 2 0  Altered sleeping - 0 -  Tired, decreased energy - 1 -  Change in appetite - 0 -  Feeling bad or failure about yourself  - 1 -  Trouble concentrating - 1 -  Moving slowly or fidgety/restless - 0 -  Suicidal thoughts - 0 -  PHQ-9 Score - 5 -  Difficult doing work/chores - - -  Some recent data might be hidden    History [name] has a past medical history of Cerebral palsy ([place]), Coronary artery disease, Depression, GERD (gastroesophageal reflux disease), Hypertension, Scoliosis, and Seizures ([place]).   He has a past surgical history that includes Coronary angioplasty with stent; Carpal tunnel release (Right, 08/13/2016); and LEFT HEART CATH AND CORONARY ANGIOGRAPHY (N/A, 07/10/2017).   His family history includes Alcohol abuse in his brother; CAD in his brother and sister; CAD (age of onset: 92) in his father; Diabetes in his mother; Heart disease in his father; Heart failure in his sister.He reports that he quit smoking about 4 weeks ago. His smoking  use included cigarettes. He has a 20.50 pack-year smoking history. He has never used smokeless tobacco. He reports current alcohol use. He reports that he does not use drugs.    ROS Review of Systems  Constitutional: Negative.   HENT: Negative.   Eyes: Negative for visual disturbance.  Respiratory: Negative for cough and shortness of breath.   Cardiovascular: Negative for chest pain and leg swelling.  Gastrointestinal: Negative for abdominal pain, diarrhea, nausea and vomiting.  Genitourinary: Negative for difficulty urinating.  Musculoskeletal: Negative for arthralgias and myalgias.  Skin: Negative for rash.  Neurological: Negative for headaches.  Psychiatric/Behavioral: Negative for sleep disturbance.    Objective:  BP (!) 92/59   Pulse 89   Temp 98.8 F (37.1 C) (Oral)   Ht 6' (1.829 m)   Wt 220 lb (99.8 kg)   SpO2 93%   BMI 29.84 kg/m   BP Readings from Last 3 Encounters:  07/06/18 (!) 92/59  06/30/18 106/74  05/04/18 131/75    Wt Readings from Last 3 Encounters:  07/06/18 220 lb (99.8 kg)  06/30/18 216 lb 14.9 oz (98.4 kg)  05/04/18 232 lb 4 oz (105.3 kg)     Physical Exam Constitutional:      General: He is not in acute distress.    Appearance: He is well-developed.  HENT:     Head: Normocephalic  and atraumatic.     Right Ear: External ear normal.     Left Ear: External ear normal.     Nose: Nose normal.  Eyes:     Conjunctiva/sclera: Conjunctivae normal.     Pupils: Pupils are equal, round, and reactive to light.  Neck:     Musculoskeletal: Normal range of motion and neck supple.  Cardiovascular:     Rate and Rhythm: Normal rate and regular rhythm.     Heart sounds: Normal heart sounds. No murmur.  Pulmonary:     Effort: Pulmonary effort is normal. No respiratory distress.     Breath sounds: Normal breath sounds. No wheezing or rales.  Abdominal:     Palpations: Abdomen is soft.     Tenderness: There is no abdominal tenderness.   Musculoskeletal: Normal range of motion.  Skin:    General: Skin is warm and dry.  Neurological:     Mental Status: He is alert and oriented to person, place, and time.     Deep Tendon Reflexes: Reflexes are normal and symmetric.  Psychiatric:        Behavior: Behavior normal.        Thought Content: Thought content normal.        Judgment: Judgment normal.       Assessment & Plan:   [name] was seen today for hospitalization follow-up.  Diagnoses and all orders for this visit:  Pulmonary emphysema, unspecified emphysema type ([place]) -     CBC with Differential/Platelet -     CMP14+EGFR  Coronary artery disease of native artery of native heart with stable angina pectoris ([place])  Essential hypertension       I am having [name] maintain his acetaminophen, albuterol, fluticasone, nitroGLYCERIN, tamsulosin, metoprolol tartrate, atorvastatin, lisinopril, pantoprazole, DULoxetine, isosorbide mononitrate, buPROPion, fexofenadine, fluticasone furoate-vilanterol, aspirin EC, guaiFENesin, and Ipratropium-Albuterol.  Allergies as of 07/06/2018      Reactions   Chantix [varenicline Tartrate] Nausea And Vomiting      Medication List       Accurate as of July 06, 2018  2:22 PM. Always use your most recent med list.        acetaminophen 325 MG tablet Commonly known as:  TYLENOL Take 2 tablets (650 mg total) by mouth every 6 (six) hours as needed for mild pain or headache.   albuterol 108 (90 Base) MCG/ACT inhaler Commonly known as:  ProAir HFA 1-2 puffs every 6 hours as needed wheezing or shortness of breath.   aspirin EC 81 MG tablet Take 1 tablet (81 mg total) by mouth daily.   atorvastatin 40 MG tablet Commonly known as:  LIPITOR Take 1 tablet (40 mg total) by mouth daily at 6 PM.   buPROPion 150 MG 12 hr tablet Commonly known as:  Wellbutrin SR Take 1 tablet (150 mg total) by mouth 2 (two) times daily.   DULoxetine 30 MG capsule Commonly known as:   CYMBALTA Take 2 capsules (60 mg total) by mouth daily. WITH A FULL STOMACH AT SUPPER TIME   fexofenadine 180 MG tablet Commonly known as:  ALLEGRA Take 1 tablet (180 mg total) by mouth daily. For allergy symptoms   fluticasone 50 MCG/ACT nasal spray Commonly known as:  FLONASE Place 2 sprays into both nostrils daily.   fluticasone furoate-vilanterol 100-25 MCG/INH Aepb Commonly known as:  Breo Ellipta Inhale 1 puff into the lungs daily.   guaiFENesin 600 MG 12 hr tablet Commonly known as:  MUCINEX Take 1 tablet (600 mg  total) by mouth 2 (two) times daily for 10 days.   Ipratropium-Albuterol 20-100 MCG/ACT Aers respimat Commonly known as:  Combivent Respimat Inhale 1 puff into the lungs every 6 (six) hours for 30 days.   isosorbide mononitrate 30 MG 24 hr tablet Commonly known as:  IMDUR Take 1 tablet (30 mg total) by mouth daily.   lisinopril 20 MG tablet Commonly known as:  PRINIVIL,ZESTRIL Take 1 tablet (20 mg total) by mouth daily.   metoprolol tartrate 25 MG tablet Commonly known as:  LOPRESSOR Take 1 tablet (25 mg total) by mouth 2 (two) times daily.   nitroGLYCERIN 0.4 MG SL tablet Commonly known as:  NITROSTAT Place 1 tablet (0.4 mg total) under the tongue every 5 (five) minutes x 3 doses as needed for chest pain.   pantoprazole 40 MG tablet Commonly known as:  PROTONIX Take 1 tablet (40 mg total) by mouth 2 (two) times daily.   tamsulosin 0.4 MG Caps capsule Commonly known as:  FLOMAX TAKE (1) CAPSULE DAILY        Follow-up: Return in about 3 months (around 10/06/2018), or if symptoms worsen or fail to improve.  [name], M.D.

## 2018-07-07 DIAGNOSIS — J449 Chronic obstructive pulmonary disease, unspecified: Secondary | ICD-10-CM | POA: Diagnosis not present

## 2018-07-07 DIAGNOSIS — Z7951 Long term (current) use of inhaled steroids: Secondary | ICD-10-CM | POA: Diagnosis not present

## 2018-07-07 DIAGNOSIS — I2510 Atherosclerotic heart disease of native coronary artery without angina pectoris: Secondary | ICD-10-CM | POA: Diagnosis not present

## 2018-07-07 DIAGNOSIS — J209 Acute bronchitis, unspecified: Secondary | ICD-10-CM | POA: Diagnosis not present

## 2018-07-07 DIAGNOSIS — I119 Hypertensive heart disease without heart failure: Secondary | ICD-10-CM | POA: Diagnosis not present

## 2018-07-07 LAB — CBC WITH DIFFERENTIAL/PLATELET
Basophils Absolute: 0 10*3/uL (ref 0.0–0.2)
Basos: 0 %
EOS (ABSOLUTE): 0.1 10*3/uL (ref 0.0–0.4)
Eos: 1 %
Hematocrit: 48.1 % (ref 37.5–51.0)
Hemoglobin: 17.1 g/dL (ref 13.0–17.7)
IMMATURE GRANULOCYTES: 1 %
Immature Grans (Abs): 0.1 10*3/uL (ref 0.0–0.1)
LYMPHS ABS: 5.4 10*3/uL — AB (ref 0.7–3.1)
Lymphs: 35 %
MCH: 35.1 pg — ABNORMAL HIGH (ref 26.6–33.0)
MCHC: 35.6 g/dL (ref 31.5–35.7)
MCV: 99 fL — ABNORMAL HIGH (ref 79–97)
Monocytes Absolute: 1.3 10*3/uL — ABNORMAL HIGH (ref 0.1–0.9)
Monocytes: 8 %
Neutrophils Absolute: 8.6 10*3/uL — ABNORMAL HIGH (ref 1.4–7.0)
Neutrophils: 55 %
Platelets: 244 10*3/uL (ref 150–450)
RBC: 4.87 x10E6/uL (ref 4.14–5.80)
RDW: 13.3 % (ref 11.6–15.4)
WBC: 15.5 10*3/uL — ABNORMAL HIGH (ref 3.4–10.8)

## 2018-07-07 LAB — CMP14+EGFR
ALT: 19 IU/L (ref 0–44)
AST: 11 IU/L (ref 0–40)
Albumin/Globulin Ratio: 1.5 (ref 1.2–2.2)
Albumin: 4.1 g/dL (ref 3.8–4.9)
Alkaline Phosphatase: 75 IU/L (ref 39–117)
BUN/Creatinine Ratio: 15 (ref 10–24)
BUN: 17 mg/dL (ref 8–27)
Bilirubin Total: 1 mg/dL (ref 0.0–1.2)
CALCIUM: 9.3 mg/dL (ref 8.6–10.2)
CO2: 29 mmol/L (ref 20–29)
Chloride: 95 mmol/L — ABNORMAL LOW (ref 96–106)
Creatinine, Ser: 1.13 mg/dL (ref 0.76–1.27)
GFR calc Af Amer: 81 mL/min/{1.73_m2} (ref >59)
GFR calc non Af Amer: 70 mL/min/{1.73_m2} (ref >59)
Globulin, Total: 2.8 g/dL (ref 1.5–4.5)
Glucose: 86 mg/dL (ref 65–99)
Potassium: 4.3 mmol/L (ref 3.5–5.2)
Sodium: 144 mmol/L (ref 134–144)
Total Protein: 6.9 g/dL (ref 6.0–8.5)

## 2018-07-08 ENCOUNTER — Ambulatory Visit: Payer: Self-pay | Admitting: Licensed Clinical Social Worker

## 2018-07-08 ENCOUNTER — Telehealth: Payer: Self-pay | Admitting: Licensed Clinical Social Worker

## 2018-07-08 DIAGNOSIS — Z550 Illiteracy and low-level literacy: Secondary | ICD-10-CM

## 2018-07-08 DIAGNOSIS — E785 Hyperlipidemia, unspecified: Secondary | ICD-10-CM

## 2018-07-08 DIAGNOSIS — G809 Cerebral palsy, unspecified: Secondary | ICD-10-CM

## 2018-07-08 DIAGNOSIS — R482 Apraxia: Secondary | ICD-10-CM

## 2018-07-08 DIAGNOSIS — I25118 Atherosclerotic heart disease of native coronary artery with other forms of angina pectoris: Secondary | ICD-10-CM

## 2018-07-08 DIAGNOSIS — F411 Generalized anxiety disorder: Secondary | ICD-10-CM

## 2018-07-08 NOTE — Patient Instructions (Signed)
Licensed Clinical Social Worker Visit Information  Goals we discussed today:  Goals Addressed            This Visit's Progress   . : " I need to get through all this legal stuff " (pt-stated)       Clinical Goal(s):  Over the next 30 days, patient will affirm attendance to scheduled court hearing and verbalize understanding of any ongoing legal obligations.   Interventions:  [name] reviewed ongoing needs of client around legal issues Confirmed that patient remains in contact with his attorney [name] talked with client about client's last court hearing and date and time of client's next court hearing [name] again reminded client that he could schedule [facility] area [facility] as needed for attendance at required court hearings.  Patient Self Care Activities:   Attends scheduled medical appointments  Takes medications as prescribed  Communicates with attorney to discuss client's legal issues  Communicates with RNCM to discuss nursing needs of client  Plan:  [name] to call client in 3 weeks to discuss with client legal issues client is facing and to encourage client to work with attorney of client regarding client's legal issues.   Client to call RN CM as needed to discuss nursing needs of client  Client to attend scheduled medical appointments    Materials Provided: No  Follow Up Plan: [name] to call client in 3 weeks to discuss with client legal issues client is facing and to encourage client to work with attorney of client regarding client's legal issues  The patient verbalized understanding of instructions provided today and declined a print copy of patient instruction materials.   [name].Jamauri Kruzel MSW, [name] Licensed Clinical Social Worker [facility] Care Management [phone]

## 2018-07-08 NOTE — Chronic Care Management (AMB) (Signed)
  Care Management Note   [name] is a 63 y.o. year old male who is a primary care patient of [name], [name], MD. The CM team was consulted for assistance with chronic disease management and care coordination.   I reached out to [place] by phone today.  . Review of patient status, including review of consultants reports, relevant laboratory and other test results, and collaboration with appropriate care team members and the patient's provider was performed as part of comprehensive patient evaluation and provision of chronic care management services.   Social Determinants of Health:  Risk for food insecurity; Risk for Stress issues  Goals Addressed            This Visit's Progress   . : " I need to get through all this legal stuff " (pt-stated)       Clinical Goal(s):  Over the next 30 days, patient will affirm attendance to scheduled court hearing and verbalize understanding of any ongoing legal obligations.   Interventions:  [name] reviewed ongoing needs of client around legal issues Confirmed that patient remains in contact with his attorney [name] talked with client about client's last court hearing and date and time of client's next court hearing  Patient Self Care Activities:   Attends scheduled medical appointments  Takes medications as prescribed  Communicates with attorney to discuss client's legal issues  Communicates with RNCM to discuss nursing needs of client  Plan:  [name] to call client in 3 weeks to discuss with client legal issues client is facing and to encouraged client to work with attorney of client regarding client's legal issues.   Client to call RN CM as needed to discuss nursing needs of client  Client to attend scheduled medical appointments     Follow Up Plan: [name] to call client in next three weeks to discuss with client legal issues faced by client and to encourage client to communicate with his attorney regarding legal issues faced.   [name].Greidys Deland MSW, [name] Licensed Clinical Social Worker [facility] Care Management [phone]

## 2018-07-08 NOTE — Telephone Encounter (Signed)
Additional note entered in error  Joshual Terrio.Ishita Mcnerney MSW, [name] Licensed Clinical Social Worker [facility] Care Management [phone]

## 2018-07-12 DIAGNOSIS — I119 Hypertensive heart disease without heart failure: Secondary | ICD-10-CM | POA: Diagnosis not present

## 2018-07-12 DIAGNOSIS — Z7951 Long term (current) use of inhaled steroids: Secondary | ICD-10-CM | POA: Diagnosis not present

## 2018-07-12 DIAGNOSIS — I2510 Atherosclerotic heart disease of native coronary artery without angina pectoris: Secondary | ICD-10-CM | POA: Diagnosis not present

## 2018-07-12 DIAGNOSIS — J209 Acute bronchitis, unspecified: Secondary | ICD-10-CM | POA: Diagnosis not present

## 2018-07-12 DIAGNOSIS — J449 Chronic obstructive pulmonary disease, unspecified: Secondary | ICD-10-CM | POA: Diagnosis not present

## 2018-07-14 DIAGNOSIS — J449 Chronic obstructive pulmonary disease, unspecified: Secondary | ICD-10-CM | POA: Diagnosis not present

## 2018-07-14 DIAGNOSIS — J209 Acute bronchitis, unspecified: Secondary | ICD-10-CM | POA: Diagnosis not present

## 2018-07-14 DIAGNOSIS — I2510 Atherosclerotic heart disease of native coronary artery without angina pectoris: Secondary | ICD-10-CM | POA: Diagnosis not present

## 2018-07-14 DIAGNOSIS — I119 Hypertensive heart disease without heart failure: Secondary | ICD-10-CM | POA: Diagnosis not present

## 2018-07-14 DIAGNOSIS — Z7951 Long term (current) use of inhaled steroids: Secondary | ICD-10-CM | POA: Diagnosis not present

## 2018-07-19 ENCOUNTER — Other Ambulatory Visit: Payer: Self-pay

## 2018-07-19 ENCOUNTER — Ambulatory Visit (INDEPENDENT_AMBULATORY_CARE_PROVIDER_SITE_OTHER): Payer: Medicare Other

## 2018-07-19 DIAGNOSIS — J209 Acute bronchitis, unspecified: Secondary | ICD-10-CM | POA: Diagnosis not present

## 2018-07-19 DIAGNOSIS — Z720 Tobacco use: Secondary | ICD-10-CM

## 2018-07-19 DIAGNOSIS — Z7982 Long term (current) use of aspirin: Secondary | ICD-10-CM

## 2018-07-19 DIAGNOSIS — I2510 Atherosclerotic heart disease of native coronary artery without angina pectoris: Secondary | ICD-10-CM

## 2018-07-19 DIAGNOSIS — Z7951 Long term (current) use of inhaled steroids: Secondary | ICD-10-CM | POA: Diagnosis not present

## 2018-07-19 DIAGNOSIS — Z9181 History of falling: Secondary | ICD-10-CM

## 2018-07-19 DIAGNOSIS — J449 Chronic obstructive pulmonary disease, unspecified: Secondary | ICD-10-CM | POA: Diagnosis not present

## 2018-07-19 DIAGNOSIS — I119 Hypertensive heart disease without heart failure: Secondary | ICD-10-CM

## 2018-07-20 ENCOUNTER — Ambulatory Visit: Payer: Self-pay | Admitting: Licensed Clinical Social Worker

## 2018-07-20 DIAGNOSIS — R482 Apraxia: Secondary | ICD-10-CM

## 2018-07-20 DIAGNOSIS — G809 Cerebral palsy, unspecified: Secondary | ICD-10-CM

## 2018-07-20 DIAGNOSIS — F411 Generalized anxiety disorder: Secondary | ICD-10-CM

## 2018-07-20 DIAGNOSIS — E785 Hyperlipidemia, unspecified: Secondary | ICD-10-CM

## 2018-07-20 DIAGNOSIS — Z550 Illiteracy and low-level literacy: Secondary | ICD-10-CM

## 2018-07-20 DIAGNOSIS — F17200 Nicotine dependence, unspecified, uncomplicated: Secondary | ICD-10-CM

## 2018-07-20 DIAGNOSIS — I25118 Atherosclerotic heart disease of native coronary artery with other forms of angina pectoris: Secondary | ICD-10-CM

## 2018-07-20 DIAGNOSIS — I209 Angina pectoris, unspecified: Secondary | ICD-10-CM

## 2018-07-20 DIAGNOSIS — I252 Old myocardial infarction: Secondary | ICD-10-CM

## 2018-07-20 NOTE — Chronic Care Management (AMB) (Signed)
  Care Management Note   [name] is a 63 y.o. year old male who is a primary care patient of [name], James Gash, MD. The CM team was consulted for assistance with chronic disease management and care coordination.   I reached out to [place] by phone today.    Review of patient status, including review of consultants reports, relevant laboratory and other test results, and collaboration with appropriate care team members and the patient's provider was performed as part of comprehensive patient evaluation and provision of chronic care management services.   Social Determinants of Health:  At risk for Stress; At risk for food insecurity  Goals Addressed            This Visit's Progress   . : " I need to get through all this legal stuff " (pt-stated)       Clinical Goal(s):  Over the next 30 days, patient will affirm attendance to scheduled court hearing/mediation session and verbalize understanding of any ongoing legal obligations.   Interventions:  [name] reviewed ongoing needs of client around legal issues Confirmed that patient remains in contact with his attorney [name] talked with client about client's last court hearing and date and time of client's next court hearing (Next hearing, mediation session is 07/28/2018 at 9:00 AM in [place], [place]) [place] again reminded client that he could schedule [facility] area Transient [facility] as needed for attendance at required court hearings or mediation sessions [name] collaborated with [employer] regarding nursing needs of client  Patient Self Care Activities:   Attends scheduled medical appointments  Takes medications as prescribed  Communicates with attorney to discuss client's legal issues  Communicates with RNCM to discuss nursing needs of client  Plan:  [name] to call client in 3 weeks to discuss with client legal issues client is facing and to encourage client to work with attorney of client regarding client's legal  issues.   Client to call RN CM as needed to discuss nursing needs of client   Client to attend scheduled medical appointments   Client to attend scheduled mediation session for client on 07/28/2018 in [place], [place].     Follow Up Plan: [name] to call client in 3 weeks to discuss client's legal needs and to encourage client to communicate with his attorney regarding legal issues faced by client  [name].Harvis Mabus MSW, [name] Licensed Clinical Social Worker [facility] Care Management [phone]

## 2018-07-20 NOTE — Patient Instructions (Signed)
Licensed Clinical Social Worker Visit Information  Goals we discussed today:  Goals Addressed            This Visit's Progress   . : " I need to get through all this legal stuff " (pt-stated)       Clinical Goal(s):  Over the next 30 days, patient will affirm attendance to scheduled court hearing/mediation session and verbalize understanding of any ongoing legal obligations.   Interventions:  [name] reviewed ongoing needs of client around legal issues Confirmed that patient remains in contact with his attorney [name] talked with client about client's last court hearing and date and time of client's next court hearing (Next hearing, mediation session is 07/28/2018 at 9:00 AM in [place], [place]) [place] again reminded client that he could schedule [facility] as needed for transport assistance to required court hearings or mediation sessions [name] collaborated with [employer] regarding nursing needs of client  Patient Self Care Activities:   Attends scheduled medical appointments  Takes medications as prescribed  Communicates with attorney to discuss client's legal issues  Communicates with RNCM to discuss nursing needs of client  Plan:  [name] to call client in 3 weeks to discuss with client legal issues client is facing and to encourage client to work with attorney of client regarding client's legal issues.   Client to call RN CM as needed to discuss nursing needs of client   Client to attend scheduled medical appointments   Client to attend scheduled mediation session for client on 07/28/2018 in [place], [place].    Materials Provided: No  Follow Up Plan: [name] to call client in next 3 weeks to discuss with client legal issues client is facing and to encourage client to work with attorney of client regarding client's legal issues  The patient verbalized understanding of instructions provided today and declined a print copy of patient instruction  materials.   [name].Rin Gorton MSW, [name] Licensed Clinical Social Worker [facility] [phone]

## 2018-07-21 ENCOUNTER — Telehealth: Payer: Medicare Other

## 2018-07-21 ENCOUNTER — Telehealth: Payer: Self-pay | Admitting: *Deleted

## 2018-07-21 DIAGNOSIS — I25118 Atherosclerotic heart disease of native coronary artery with other forms of angina pectoris: Secondary | ICD-10-CM

## 2018-07-21 DIAGNOSIS — J441 Chronic obstructive pulmonary disease with (acute) exacerbation: Secondary | ICD-10-CM

## 2018-07-21 DIAGNOSIS — J209 Acute bronchitis, unspecified: Secondary | ICD-10-CM

## 2018-07-21 DIAGNOSIS — I10 Essential (primary) hypertension: Secondary | ICD-10-CM

## 2018-07-21 DIAGNOSIS — I2510 Atherosclerotic heart disease of native coronary artery without angina pectoris: Secondary | ICD-10-CM | POA: Diagnosis not present

## 2018-07-21 DIAGNOSIS — J440 Chronic obstructive pulmonary disease with acute lower respiratory infection: Secondary | ICD-10-CM

## 2018-07-21 DIAGNOSIS — J449 Chronic obstructive pulmonary disease, unspecified: Secondary | ICD-10-CM | POA: Diagnosis not present

## 2018-07-21 DIAGNOSIS — Z7951 Long term (current) use of inhaled steroids: Secondary | ICD-10-CM | POA: Diagnosis not present

## 2018-07-21 DIAGNOSIS — I119 Hypertensive heart disease without heart failure: Secondary | ICD-10-CM | POA: Diagnosis not present

## 2018-07-21 DIAGNOSIS — E785 Hyperlipidemia, unspecified: Secondary | ICD-10-CM

## 2018-07-21 MED ORDER — TAMSULOSIN HCL 0.4 MG PO CAPS: ORAL_CAPSULE | ORAL | 0 refills | Status: DC

## 2018-07-21 MED ORDER — LISINOPRIL 20 MG PO TABS: 20.0000 mg | ORAL_TABLET | Freq: Every day | ORAL | 1 refills | Status: DC

## 2018-07-21 MED ORDER — FLUTICASONE FUROATE-VILANTEROL 100-25 MCG/INH IN AEPB: INHALATION_SPRAY | RESPIRATORY_TRACT | 0 refills | Status: DC

## 2018-07-21 MED ORDER — ALBUTEROL SULFATE HFA 108 (90 BASE) MCG/ACT IN AERS: INHALATION_SPRAY | RESPIRATORY_TRACT | 10 refills | Status: DC

## 2018-07-21 MED ORDER — METOPROLOL TARTRATE 25 MG PO TABS: 25.0000 mg | ORAL_TABLET | Freq: Two times a day (BID) | ORAL | 1 refills | Status: DC

## 2018-07-21 MED ORDER — FLUTICASONE PROPIONATE 50 MCG/ACT NA SUSP: 2.0000 | Freq: Every day | NASAL | 1 refills | Status: DC | PRN

## 2018-07-21 MED ORDER — BUPROPION HCL ER (SR) 150 MG PO TB12: 150.0000 mg | ORAL_TABLET | Freq: Two times a day (BID) | ORAL | 0 refills | Status: DC

## 2018-07-21 MED ORDER — NITROGLYCERIN 0.4 MG SL SUBL: 0.4000 mg | SUBLINGUAL_TABLET | SUBLINGUAL | 11 refills | Status: DC | PRN

## 2018-07-21 MED ORDER — PANTOPRAZOLE SODIUM 40 MG PO TBEC: 40.0000 mg | DELAYED_RELEASE_TABLET | Freq: Two times a day (BID) | ORAL | 0 refills | Status: DC

## 2018-07-21 MED ORDER — FEXOFENADINE HCL 180 MG PO TABS: 180.0000 mg | ORAL_TABLET | Freq: Every day | ORAL | 2 refills | Status: DC

## 2018-07-21 MED ORDER — DULOXETINE HCL 30 MG PO CPEP: 60.0000 mg | ORAL_CAPSULE | Freq: Every day | ORAL | 0 refills | Status: DC

## 2018-07-21 MED ORDER — IPRATROPIUM-ALBUTEROL 20-100 MCG/ACT IN AERS: 1.0000 | INHALATION_SPRAY | Freq: Four times a day (QID) | RESPIRATORY_TRACT | 0 refills | Status: DC

## 2018-07-21 MED ORDER — ATORVASTATIN CALCIUM 40 MG PO TABS: 40.0000 mg | ORAL_TABLET | Freq: Every day | ORAL | 0 refills | Status: DC

## 2018-07-21 MED ORDER — ISOSORBIDE MONONITRATE ER 30 MG PO TB24: 30.0000 mg | ORAL_TABLET | Freq: Every day | ORAL | 1 refills | Status: DC

## 2018-07-21 MED ORDER — ASPIRIN EC 81 MG PO TBEC: 81.0000 mg | DELAYED_RELEASE_TABLET | Freq: Every day | ORAL | 11 refills | Status: DC

## 2018-07-21 NOTE — Telephone Encounter (Signed)
TC from [facility], pt's medications are changing to their pharmacy for easy of blister packaging. Sending all medications

## 2018-07-22 ENCOUNTER — Ambulatory Visit: Payer: Self-pay | Admitting: Licensed Clinical Social Worker

## 2018-07-22 ENCOUNTER — Telehealth: Payer: Self-pay | Admitting: *Deleted

## 2018-07-22 ENCOUNTER — Encounter: Payer: Self-pay | Admitting: Family Medicine

## 2018-07-22 ENCOUNTER — Ambulatory Visit (INDEPENDENT_AMBULATORY_CARE_PROVIDER_SITE_OTHER): Payer: Medicare Other | Admitting: Family Medicine

## 2018-07-22 ENCOUNTER — Other Ambulatory Visit: Payer: Self-pay

## 2018-07-22 DIAGNOSIS — I10 Essential (primary) hypertension: Secondary | ICD-10-CM

## 2018-07-22 DIAGNOSIS — G809 Cerebral palsy, unspecified: Secondary | ICD-10-CM

## 2018-07-22 DIAGNOSIS — R42 Dizziness and giddiness: Secondary | ICD-10-CM

## 2018-07-22 DIAGNOSIS — F411 Generalized anxiety disorder: Secondary | ICD-10-CM

## 2018-07-22 DIAGNOSIS — R482 Apraxia: Secondary | ICD-10-CM

## 2018-07-22 DIAGNOSIS — Z550 Illiteracy and low-level literacy: Secondary | ICD-10-CM

## 2018-07-22 DIAGNOSIS — I25118 Atherosclerotic heart disease of native coronary artery with other forms of angina pectoris: Secondary | ICD-10-CM | POA: Diagnosis not present

## 2018-07-22 DIAGNOSIS — I209 Angina pectoris, unspecified: Secondary | ICD-10-CM

## 2018-07-22 DIAGNOSIS — I252 Old myocardial infarction: Secondary | ICD-10-CM

## 2018-07-22 MED ORDER — METOPROLOL TARTRATE 25 MG PO TABS: 12.5000 mg | ORAL_TABLET | Freq: Two times a day (BID) | ORAL | 0 refills | Status: DC

## 2018-07-22 NOTE — Chronic Care Management (AMB) (Signed)
  Care Management Note   [name] is a 63 y.o. year old male who is a primary care patient of [name], [name], MD. The CM team was consulted for assistance with chronic disease management and care coordination.   I reached out to [place] by phone today.   Review of patient status, including review of consultants reports, relevant laboratory and other test results, and collaboration with appropriate care team members and the patient's provider was performed as part of comprehensive patient evaluation and provision of chronic care management services.  Goals Addressed            This Visit's Progress   . : " I need to get through all this legal stuff " (pt-stated)       Clinical Goal(s):  Over the next 30 days, patient will affirm attendance to scheduled court hearing/mediation session and verbalize understanding of any ongoing legal obligations.   Interventions:  Confirmed that patient remains in contact with his attorney [name] talked with client about client's last court hearing and date and time of client's next court hearing (Next hearing, mediation session is 07/28/2018 at 9:00 AM in [place], [place]) [place] again reminded client that he could schedule [facility] as needed for attendance at required court hearings or mediation sessions [name] collaborated with [employer] regarding nursing needs of client  Patient Self Care Activities:   Attends scheduled medical appointments  Takes medications as prescribed  Communicates with attorney to discuss client's legal issues  Communicates with RNCM to discuss nursing needs of client  Plan:  [name] to call client in 3 weeks to discuss with client legal issues client is facing and to encourage client to work with attorney of client regarding client's legal issues.   Client to call RN CM as needed to discuss nursing needs of client   Client to attend scheduled medical appointments   Client to  attend scheduled mediation session for client on 07/28/2018 in [place], [place].(sister of client is scheduled to transport client to and from mediation session on 07/28/2018)    Related to medical/nursing needs of client:  Client said he was feeling occasionally dizzy, unbalanced when walking (occasionally), has headaches and some shaking when sleeping. He said he had a cough. [name] collaborated with RN CM [name] on 07/22/2018 related to medical nursing needs of client.  [name] then spoke via phone with LPN Otilio Connors on 07/22/2018 and described client needs and symptoms. Otilio Connors LPN set up client phone visit with Dr. Livia Snellen for today, 07/22/2018 at 3:10 PM.     Follow Up Plan: [name] to call client in 3 weeks to discuss with client legal issues client is facing and to encourage client to communicate with attorney for client related to legal issues faced by client.   [name].Mariaha Ellington MSW, [name] Licensed Clinical Social Worker [facility] [phone]

## 2018-07-22 NOTE — Telephone Encounter (Signed)
Pt having some dizzy spells, scheduled televisit with Dr Livia Snellen today at 3:10.

## 2018-07-22 NOTE — Patient Instructions (Signed)
Licensed Clinical Social Worker Visit Information  Goals we discussed today:  Goals Addressed            This Visit's Progress   . : " I need to get through all this legal stuff " (pt-stated)       Clinical Goal(s):  Over the next 30 days, patient will affirm attendance to scheduled court hearing/mediation session and verbalize understanding of any ongoing legal obligations.   Interventions:  Confirmed that patient remains in contact with his attorney [name] talked with client about client's last court hearing and date and time of client's next court hearing (Next hearing, mediation session is 07/28/2018 at 9:00 AM in [place], [place]) [place] again reminded client that he could schedule [facility] area [facility] as needed for attendance at required court hearings or mediation sessions [name] collaborated with [employer] regarding nursing needs of client  Patient Self Care Activities:   Attends scheduled medical appointments  Takes medications as prescribed  Communicates with attorney to discuss client's legal issues  Communicates with RNCM to discuss nursing needs of client  Plan:  [name] to call client in 3 weeks to discuss with client legal issues client is facing and to encourage client to work with attorney of client regarding client's legal issues.   Client to call RN CM as needed to discuss nursing needs of client   Client to attend scheduled medical appointments   Client to attend scheduled mediation session for client on 07/28/2018 in [place], [place].(sister of client is scheduled to transport client to and from mediation session on 07/28/2018)    Materials Provided: No  Follow Up Plan: [name] to call client in 3 weeks to discuss with client legal issues client is facing and to encourage client  to communicate with attorney of client regarding client's legal issues  The patient verbalized understanding of instructions provided today and declined a print copy of  patient instruction materials.   [name].Ollen Rao MSW, [name] Licensed Clinical Social Worker [facility] [phone]

## 2018-07-22 NOTE — Progress Notes (Signed)
Subjective:  Patient ID: [name], male    DOB: [birth year]  Age: 63 y.o. MRN: [id number]  CC: No chief complaint on file.   HPI [name] presents for feeling lightheaded and weak having to lay down.  When he stands up he gets a bit weak also.  He notes that his blood pressures been low on checks over the recent several weeks.  Recently seen at hospital and told that his blood pressure was too low.  Wants to see if we can decrease his blood pressure medication and see if his symptoms go away.  He denies passing out, syncope.  The patient is illiterate and cannot tell which medicine is which.. Depression screen [facility] 2/9 07/06/2018 04/11/2018 03/28/2018  Decreased Interest 0 1 0  Down, Depressed, Hopeless 0 1 0  PHQ - 2 Score 0 2 0  Altered sleeping - 0 -  Tired, decreased energy - 1 -  Change in appetite - 0 -  Feeling bad or failure about yourself  - 1 -  Trouble concentrating - 1 -  Moving slowly or fidgety/restless - 0 -  Suicidal thoughts - 0 -  PHQ-9 Score - 5 -  Difficult doing work/chores - - -  Some recent data might be hidden    History [name] has a past medical history of Cerebral palsy ([place]), Coronary artery disease, Depression, GERD (gastroesophageal reflux disease), Hypertension, Scoliosis, and Seizures ([place]).   He has a past surgical history that includes Coronary angioplasty with stent; Carpal tunnel release (Right, 08/13/2016); and LEFT HEART CATH AND CORONARY ANGIOGRAPHY (N/A, 07/10/2017).   His family history includes Alcohol abuse in his brother; CAD in his brother and sister; CAD (age of onset: 22) in his father; Diabetes in his mother; Heart disease in his father; Heart failure in his sister.He reports that he quit smoking about 6 weeks ago. His smoking use included cigarettes. He has a 20.50 pack-year smoking history. He has never used smokeless tobacco. He reports current alcohol use. He reports that he does not use drugs.    ROS Review of Systems   Constitutional: Negative for fever.  Respiratory: Negative for shortness of breath.   Cardiovascular: Negative for chest pain.  Musculoskeletal: Negative for arthralgias.  Skin: Negative for rash.    Objective:  There were no vitals taken for this visit.  BP Readings from Last 3 Encounters:  07/06/18 (!) 92/59  06/30/18 106/74  05/04/18 131/75    Wt Readings from Last 3 Encounters:  07/06/18 220 lb (99.8 kg)  06/30/18 216 lb 14.9 oz (98.4 kg)  05/04/18 232 lb 4 oz (105.3 kg)     Physical Exam  Deferred, phone visit  Assessment & Plan:   Diagnoses and all orders for this visit:  Dizziness  Essential hypertension -     Discontinue: metoprolol tartrate (LOPRESSOR) 25 MG tablet; Take 0.5 tablets (12.5 mg total) by mouth 2 (two) times daily. Do this for 1 week then DC the medication -     metoprolol tartrate (LOPRESSOR) 25 MG tablet; Take 0.5 tablets (12.5 mg total) by mouth 2 (two) times daily. Do this for 1 week then DC the medication  Coronary artery disease of native artery of native heart with stable angina pectoris ([place]) -     Discontinue: metoprolol tartrate (LOPRESSOR) 25 MG tablet; Take 0.5 tablets (12.5 mg total) by mouth 2 (two) times daily. Do this for 1 week then DC the medication -     metoprolol tartrate (  LOPRESSOR) 25 MG tablet; Take 0.5 tablets (12.5 mg total) by mouth 2 (two) times daily. Do this for 1 week then DC the medication       I have discontinued [place] lisinopril. I am also having him maintain his acetaminophen, albuterol, aspirin EC, atorvastatin, buPROPion, DULoxetine, fexofenadine, fluticasone, fluticasone furoate-vilanterol, Ipratropium-Albuterol, isosorbide mononitrate, nitroGLYCERIN, pantoprazole, tamsulosin, and metoprolol tartrate.  Allergies as of 07/22/2018      Reactions   Chantix [varenicline Tartrate] Nausea And Vomiting      Medication List       Accurate as of July 22, 2018  5:40 PM. Always use your most recent  med list.        acetaminophen 325 MG tablet Commonly known as:  TYLENOL Take 2 tablets (650 mg total) by mouth every 6 (six) hours as needed for mild pain or headache.   albuterol 108 (90 Base) MCG/ACT inhaler Commonly known as:  ProAir HFA 1-2 puffs every 6 hours as needed wheezing or shortness of breath.   aspirin EC 81 MG tablet Take 1 tablet (81 mg total) by mouth daily.   atorvastatin 40 MG tablet Commonly known as:  LIPITOR Take 1 tablet (40 mg total) by mouth daily at 6 PM.   buPROPion 150 MG 12 hr tablet Commonly known as:  Wellbutrin SR Take 1 tablet (150 mg total) by mouth 2 (two) times daily.   DULoxetine 30 MG capsule Commonly known as:  CYMBALTA Take 2 capsules (60 mg total) by mouth daily. WITH A FULL STOMACH AT SUPPER TIME   fexofenadine 180 MG tablet Commonly known as:  ALLEGRA Take 1 tablet (180 mg total) by mouth daily. For allergy symptoms   fluticasone 50 MCG/ACT nasal spray Commonly known as:  FLONASE Place 2 sprays into both nostrils daily as needed for allergies or rhinitis.   fluticasone furoate-vilanterol 100-25 MCG/INH Aepb Commonly known as:  Breo Ellipta Inhale 1 puff into the lungs daily.   Ipratropium-Albuterol 20-100 MCG/ACT Aers respimat Commonly known as:  Combivent Respimat Inhale 1 puff into the lungs every 6 (six) hours for 30 days.   isosorbide mononitrate 30 MG 24 hr tablet Commonly known as:  IMDUR Take 1 tablet (30 mg total) by mouth daily.   metoprolol tartrate 25 MG tablet Commonly known as:  LOPRESSOR Take 0.5 tablets (12.5 mg total) by mouth 2 (two) times daily. Do this for 1 week then DC the medication   nitroGLYCERIN 0.4 MG SL tablet Commonly known as:  NITROSTAT Place 1 tablet (0.4 mg total) under the tongue every 5 (five) minutes x 3 doses as needed for chest pain.   pantoprazole 40 MG tablet Commonly known as:  PROTONIX Take 1 tablet (40 mg total) by mouth 2 (two) times daily.   tamsulosin 0.4 MG Caps capsule  Commonly known as:  FLOMAX TAKE (1) CAPSULE DAILY     Virtual Visit via telephone Note  I discussed the limitations, risks, security and privacy concerns of performing an evaluation and management service by telephone and the availability of in person appointments. I also discussed with the patient that there may be a patient responsible charge related to this service. The patient expressed understanding and agreed to proceed. Pt. Is at home. Dr. Livia Snellen is in his office.  Follow Up Instructions:   I discussed the assessment and treatment plan with the patient. The patient was provided an opportunity to ask questions and all were answered. The patient agreed with the plan and demonstrated an understanding  of the instructions.   The patient was advised to call back or seek an in-person evaluation if the symptoms worsen or if the condition fails to improve as anticipated.  Visit started: 3:30 Call ended:  3:48 Total minutes including chart review and phone contact time: 18    Follow-up: Return in about 6 weeks (around 09/02/2018), or if symptoms worsen or fail to improve.  [name], M.D.

## 2018-07-25 ENCOUNTER — Ambulatory Visit: Payer: Medicare Other | Admitting: *Deleted

## 2018-07-25 ENCOUNTER — Telehealth: Payer: Self-pay | Admitting: *Deleted

## 2018-07-25 DIAGNOSIS — I2510 Atherosclerotic heart disease of native coronary artery without angina pectoris: Secondary | ICD-10-CM | POA: Diagnosis not present

## 2018-07-25 DIAGNOSIS — I119 Hypertensive heart disease without heart failure: Secondary | ICD-10-CM | POA: Diagnosis not present

## 2018-07-25 DIAGNOSIS — Z7951 Long term (current) use of inhaled steroids: Secondary | ICD-10-CM | POA: Diagnosis not present

## 2018-07-25 DIAGNOSIS — J449 Chronic obstructive pulmonary disease, unspecified: Secondary | ICD-10-CM | POA: Diagnosis not present

## 2018-07-25 DIAGNOSIS — J209 Acute bronchitis, unspecified: Secondary | ICD-10-CM | POA: Diagnosis not present

## 2018-07-25 NOTE — Telephone Encounter (Signed)
Incoming call from pt BP [phone] Pt states he is feeling better

## 2018-07-26 ENCOUNTER — Telehealth: Payer: Self-pay | Admitting: Family Medicine

## 2018-07-26 ENCOUNTER — Other Ambulatory Visit: Payer: Self-pay

## 2018-07-26 NOTE — Telephone Encounter (Signed)
Pt is wanting to know why dr stacks sent the pt another in hal[place] out and why he has changed some of his medicines

## 2018-07-26 NOTE — Telephone Encounter (Signed)
Patient states that he will call us back when his sister's house.

## 2018-07-26 NOTE — Chronic Care Management (AMB) (Signed)
  Chronic Care Management    Follow Up Note   07/26/2018 Name: [name] MRN: [id number] DOB: [birth year]  Referred by: [name], MD Reason for referral : Chronic Care Management (RNCM and [name] telephone follow up)   [name] is a 63 y.o. year old male who is a primary care patient of [name], [name], MD. The CCM team was consulted for assistance with chronic disease management and care coordination needs.    Review of patient status, including review of consultants reports, relevant laboratory and other test results, and collaboration with appropriate care team members and the patient's provider was performed as part of comprehensive patient evaluation and provision of chronic care management services.    Today's visit was via telephone and in conjunction with [employer]" Forrest, [name]. Our primary focus was on the recent changes to his blood pressure medications and management of hypotension.   Goals Addressed    . "I want to make sure my blood pressure stays under control" (pt-stated)       Current Barriers:  [name] Knowledge Deficits related to blood pressure readings and management . Lacks caregiver support.   . Cognitive deficits . illiterate  Nurse Case Manager Clinical Goal(s):  [name] Over the next 7 days, Patient will verbalize understanding of plan to manage his blood pressure . Over the next 7 days, patient will verbalize understanding of the need to check and record his blood pressure twice daily . Over the next 7 days, patient will verbalize understanding of blood pressure medication changes  Interventions:  . Chart review and discussion with [name] . Advised patient to check and record blood pressure twice daily . Advised to drink plenty of fluids (avoid alcohol) . Advised to continue to avoid cigarettes/nicotine . Call our office at [phone] if he experiences persistent dizziness or lightheadedness . Call our office if blood pressure readings are below  100/70 . Collaborated with other office nursing staff to understand order for meds to be sent to a new pharmacy for pill packaging . Verified that medications will still be filled and packed at [facility] . Verified that [facility] delivered a new pill pack on 4/420 and they they removed lisinopril and removed one metoprolol.  . Verified upcoming follow up appt with PCP . Planned collaboration with Dr Rosezella [place] office (cardio) regarding change in hypertensive meds  Patient Self Care Activities:  . Currently UNABLE TO independently completely bathe, drive, perform housekeeping duties*  . He is able to take his prepackaged medications on his own . Prepares his own meals  Please see past updates related to this goal by clicking on the "Past Updates" button in the selected goal          Follow Up Plan:  The CM team will reach out to the patient again over the next 7 days.    [name], RN-BC, BSN Nurse Case Manager [place] [phone]

## 2018-07-27 ENCOUNTER — Other Ambulatory Visit: Payer: Self-pay | Admitting: *Deleted

## 2018-07-27 ENCOUNTER — Ambulatory Visit: Payer: Medicare Other | Admitting: *Deleted

## 2018-07-27 DIAGNOSIS — I10 Essential (primary) hypertension: Secondary | ICD-10-CM

## 2018-07-27 DIAGNOSIS — Z550 Illiteracy and low-level literacy: Secondary | ICD-10-CM

## 2018-07-27 DIAGNOSIS — G809 Cerebral palsy, unspecified: Secondary | ICD-10-CM

## 2018-07-27 MED ORDER — IPRATROPIUM-ALBUTEROL 20-100 MCG/ACT IN AERS: 1.0000 | INHALATION_SPRAY | Freq: Four times a day (QID) | RESPIRATORY_TRACT | 0 refills | Status: DC

## 2018-07-28 NOTE — Chronic Care Management (AMB) (Signed)
  Chronic Care Management   Follow Up Note   07/27/2018 Name: [name] MRN: [id number] DOB: [birth year]  Referred by: [name], MD Reason for referral : Chronic Care Management (RNCM telephone follow up)   KIANDRE SPAGNOLO is a 63 y.o. year old male who is a primary care patient of [name], [name], MD. The CCM team was consulted for assistance with chronic disease management and care coordination needs.    Review of patient status, including review of consultants reports, relevant laboratory and other test results, and collaboration with appropriate care team members and the patient's provider was performed as part of comprehensive patient evaluation and provision of chronic care management services.    I spoke with [name] via telephone regarding his home blood pressure readings as well as to verify that the bottle of medicine that he received from [facility] is Celebrex. He takes that as needed and requested in the past to not have it included in his pill pack.   Goals Addressed    . "I want to make sure my blood pressure stays under control" (pt-stated)       Current Barriers:  [name] Knowledge Deficits related to blood pressure readings and management . Lacks caregiver support.   . Cognitive deficits . illiterate  Nurse Case Manager Clinical Goal(s):  [name] Over the next 7 days, Patient will verbalize understanding of plan to manage his blood pressure . Over the next 7 days, patient will verbalize understanding of the need to check and record his blood pressure twice daily . Over the next 7 days, patient will verbalize understanding of blood pressure medication changes  Interventions:  . Spoke with patient via telephone . Questioned about recent symptoms. Denies any headaches or dizziness.  [name] about home blood pressure readings. Patient reports readings of: Date AM BP PM BP  07/26/18  117/72  07/27/18 154/97 153/82   Reviewed medications. Patient has prepackaged  medications from [facility]. Recent changes were d/c lisinopril and cutting metoprolol to daily instead of twice daily with a plan to d/c completley after 1 week.  Will collaborate with PCP regarding elevated home readings and see if he would like to adjust the change in medications  Advised to continue checking and recording blood pressure readings twice daily  Patient Self Care Activities:  . Currently UNABLE TO independently completely bathe, drive, perform housekeeping duties*  . He is able to take his prepackaged medications on his own . Prepares his own meals     Follow up Plan The CM team will reach out to the patient again over the next 7 days.    [name], RN-BC, BSN Nurse Case Manager [place] [phone]

## 2018-07-28 NOTE — Patient Instructions (Signed)
Visit Information  Goals Addressed            This Visit's Progress   . COMPLETED: " I am having pain in my chest more often" (pt-stated)       Clinical Social Work Clinical Goal(s): Over next 72 hours, patient will collaborate with nurse case manager regarding management of chest pain  Interventions: . Asked RN Case Manager to speak with pt on the phone immediately at patient's report of chest pain . Referred to nurse case manager for nursing CCM services  Patient Self Care Activities:  . Patient self administers medications including Ntg . Monitors chest pain and reports  Current issue resolved. No episodes of chest pain since last visit with cardiology and addition of ASA and Imdur. Will follow up as needed.  [name], RN-BC, BSN Nurse Case Manager [place] [phone]       . COMPLETED: " I want to make sure I get ride to heart doctor" (pt-stated)       Clinical Social Work Clinical Goal(s): Over the next 30 days, client will verbalize understanding of transport resources.   Interventions: . Confirmed that patient attended scheduled cardiology appointment on 04/27/18   Patient Self Care Activities:  . Patient knows to contact transport Education officer, museum when assistance needed for transport services. . Patient contacts [name] to discuss upcoming appointments.        . COMPLETED: "I need help with getting food" (pt-stated)       Clinical Goal(s):   Client will express understanding of food resources agencies in area in next 30 days and understand process for applying for food assistance.  Interventions: Confirmed that patient has access to adequate food supply and understands resources/agencies available    . "I need to figure out what is going on with my heart" (pt-stated)       Nurse Case Manager Clinical Goal(s): Over the next 6 months patient will demonstrate self-management of chest pain and will have no ED for angina   Interventions:    Discussed with patient. No recent issues with chest pain.   RNCM will continue to remain available as needed.  Patient Self Care Activities:  . Patient monitors chest pain and reports  . Patient self administers medications . Patient calls the office with questions or concerns   *See Past Updates for previous documentation    . COMPLETED: "I thought Dr Percival Spanish was going to prescribe me something new at my appointment but it wasn't put in my pack." (pt-stated)       Current Barriers:  . Illiteracy . Does not know what each medication is or what it is used for  Nurse Case Manager Clinical Goal(s): Over the next 2 weeks patient will be able to recognize that Aspirin 81mg  has been added to his pill pack.   Interventions:  . Explained that Dr Percival Spanish did prescirbe Aspirin 81 mg but that it is an OTC medication.  . To help Mr Coykendall take his medications as prescribed, I collaborated with [employer] and reordered Aspirin 81 mg with the instructions to include it daily in his pill pack.     Patient Self Care Activities:  . Self administers medications . Aware of how many medications he takes daily and what they look like   Please see past updates related to this goal by clicking on the "Past Updates" button in the selected goal       . COMPLETED: "I want to know about  changes to my medicine" (pt-stated)       Clinical Social Work Clinical Goal(s): Over next 7 days, patient will collaborate with nurse case manager regarding questions about medications.   Interventions: 1. Listened to client's concerns over medication changes. 2/  Referred to nurse case manager for assistance with medicatons.  Patient Self Care Activities:  1. Patient takes medications independently from pre-filled pill packs  Please see past updates related to this goal by clicking on the "Past Updates" button in the selected goal      . "I want to make sure my blood pressure stays under control"  (pt-stated)       Current Barriers:  [name] Knowledge Deficits related to blood pressure readings and management . Lacks caregiver support.   . Cognitive deficits . illiterate  Nurse Case Manager Clinical Goal(s):  [name] Over the next 7 days, Patient will verbalize understanding of plan to manage his blood pressure . Over the next 7 days, patient will verbalize understanding of the need to check and record his blood pressure twice daily . Over the next 7 days, patient will verbalize understanding of blood pressure medication changes  Interventions:  . Spoke with patient via telephone . Questioned about recent symptoms. Denies any headaches or dizziness.  [name] about home blood pressure readings. Patient reports readings of: Date AM BP PM BP  07/26/18  117/72  07/27/18 154/97 153/82   Reviewed medications. Patient has prepackaged medications from [facility]. Recent changes were d/c lisinopril and cutting metoprolol to daily instead of twice daily with a plan to d/c completley after 1 week.  Will collaborate with PCP regarding elevated home readings and see if he would like to adjust the change in   Advised to continue checking and recording blood pressure readings twice daily  Patient Self Care Activities:  . Currently UNABLE TO independently completely bathe, drive, perform housekeeping duties*  . He is able to take his prepackaged medications on his own . Prepares his own meals  Please see past updates related to this goal by clicking on the "Past Updates" button in the selected goal       . COMPLETED: "I want to make sure that I get a ride to my next appointment with Dr Livia Snellen" (pt-stated)       Current Barriers:  [name] Knowledge Deficits related to ways to arrange transportation . Lacks caregiver support.  . Film/video editor.  . Literacy barriers . Transportation barriers  Nurse Case Manager Clinical Goal(s):  [name] Over the next 14 days, patient will verbalize  understanding of plan for arranging transportation to his next visit with Dr Livia Snellen in April.  Interventions:  . Collaborated with [name], [name] regarding [facility] transportation arrangement services . Provided patient and/or caregiver with verbal information about transportation arrangement. He should expect a phone call from [employer] with information about his transportation for that visit.  Advice worker).  Patient Self Care Activities:  . Currently UNABLE TO independently all ADLs independently or drive. * . Self administers medications as prescribed . Attends church or other social activities . Calls provider office for new concerns or questions  Plan:  . Patient will talk with transportatoin coordinator when she calls.  [name] RNCM will forward reminder to [name] regarding arrangements . [name] willreach out to [employer] with [facility] to request that she arrange transportation and contact [place] with the information.    Initial goal documentation   [name], RN-BC, BSN Nurse Case Manager [facility] (  [phone]        . COMPLETED: "I want to make sure that I know what all of the pills I am taking are for. There are two new pills that I don't know what they're for." (pt-stated)       Current Barriers:  . Cognitive deficits . illiteracy  Nurse Case Manager Clinical Goal(s): Over the next 2 months patient will be able to identify each of his medications by site and know what they are used for.  Interventions:  . Reviewed current medication list and their prescribed dates . Crossreferenced the prescription dates with the office notes and provided an explanation to the patient . Advised that fexofenadine (Allegra) was prescribed in response to his request for something for his runny nose . Advised that Celebrex was prescribed in response to his complaints of joint pain. [name] with [facility] and had them remove the Celebrex from the pill  package and provide it in a sepearate bottle so that Mr Jaquith can take it daily as needed at his discretion. . Will collaborate with pharmacy to provide patient with a visual resource to tell what each pill does  Patient Self Care Activities:  . Self administers medications . Aware of the number of pills that he takes and what color they are . Will question someone if he thinks something is wrong   Please see past updates related to this goal by clicking on the "Past Updates" button in the selected goal       . COMPLETED: "I've had a cold for a week and I don't know what else to do for it" (pt-stated)       Current Barriers:  [name] Knowledge Deficits related to self management of sinusitis . Lacks caregiver support.  . Film/video editor.  . Literacy barriers . Transportation barriers  Nurse Case Manager Clinical Goal(s):  [name] Over the next 7 days patient will verbalize improvement or resolution of sinusitis symptoms.   Interventions:  . Advised patient to Take 1 tablet of Augmentin twice daily with food and a glass of water. Continue to use Flonase and allegra for allergy symptoms and use saline nasal spray as often as needed. Stop taking Nyquil or any other multisymptom cold medications.  . Reviewed medications with patient and discussed frequent side effects of Augmentin.  [name] Collaborated with Dr Livia Snellen regarding frontal facial pain/pressure, headache, and nasal congestion x 1 week.   Patient Self Care Activities:  . Self administers medications as prescribed- Gamaliel will deliver and patient is able to take medications on his after being given instructions.  . Calls for appointments and with problems  Plan:  . Patient will take augmentin as prescribed . Patient will call if symptoms worsen or persist . RNCM will follow up with patient via telephone over the next 7 days to assess improvement.    Initial goal documentation   [name], RN-BC, BSN Nurse Case  Manager [facility] [phone]           The patient verbalized understanding of instructions provided today and declined a print copy of patient instruction materials.   The CM team will reach out to the patient again over the next 7 days.    [name], RN-BC, BSN Nurse Case Manager [place] [phone]

## 2018-07-29 DIAGNOSIS — J449 Chronic obstructive pulmonary disease, unspecified: Secondary | ICD-10-CM | POA: Diagnosis not present

## 2018-07-29 DIAGNOSIS — Z7951 Long term (current) use of inhaled steroids: Secondary | ICD-10-CM | POA: Diagnosis not present

## 2018-07-29 DIAGNOSIS — I2510 Atherosclerotic heart disease of native coronary artery without angina pectoris: Secondary | ICD-10-CM | POA: Diagnosis not present

## 2018-07-29 DIAGNOSIS — I119 Hypertensive heart disease without heart failure: Secondary | ICD-10-CM | POA: Diagnosis not present

## 2018-07-29 DIAGNOSIS — J209 Acute bronchitis, unspecified: Secondary | ICD-10-CM | POA: Diagnosis not present

## 2018-08-01 ENCOUNTER — Ambulatory Visit: Payer: Self-pay | Admitting: Licensed Clinical Social Worker

## 2018-08-01 ENCOUNTER — Ambulatory Visit (INDEPENDENT_AMBULATORY_CARE_PROVIDER_SITE_OTHER): Payer: Medicare Other | Admitting: *Deleted

## 2018-08-01 DIAGNOSIS — R482 Apraxia: Secondary | ICD-10-CM

## 2018-08-01 DIAGNOSIS — I25118 Atherosclerotic heart disease of native coronary artery with other forms of angina pectoris: Secondary | ICD-10-CM

## 2018-08-01 DIAGNOSIS — G809 Cerebral palsy, unspecified: Secondary | ICD-10-CM

## 2018-08-01 DIAGNOSIS — Z550 Illiteracy and low-level literacy: Secondary | ICD-10-CM

## 2018-08-01 DIAGNOSIS — I2510 Atherosclerotic heart disease of native coronary artery without angina pectoris: Secondary | ICD-10-CM | POA: Diagnosis not present

## 2018-08-01 DIAGNOSIS — F411 Generalized anxiety disorder: Secondary | ICD-10-CM

## 2018-08-01 DIAGNOSIS — I209 Angina pectoris, unspecified: Secondary | ICD-10-CM | POA: Diagnosis not present

## 2018-08-01 DIAGNOSIS — Z7951 Long term (current) use of inhaled steroids: Secondary | ICD-10-CM | POA: Diagnosis not present

## 2018-08-01 DIAGNOSIS — J209 Acute bronchitis, unspecified: Secondary | ICD-10-CM | POA: Diagnosis not present

## 2018-08-01 DIAGNOSIS — J449 Chronic obstructive pulmonary disease, unspecified: Secondary | ICD-10-CM | POA: Diagnosis not present

## 2018-08-01 DIAGNOSIS — I252 Old myocardial infarction: Secondary | ICD-10-CM | POA: Diagnosis not present

## 2018-08-01 DIAGNOSIS — I119 Hypertensive heart disease without heart failure: Secondary | ICD-10-CM | POA: Diagnosis not present

## 2018-08-01 DIAGNOSIS — E785 Hyperlipidemia, unspecified: Secondary | ICD-10-CM | POA: Diagnosis not present

## 2018-08-01 DIAGNOSIS — I10 Essential (primary) hypertension: Secondary | ICD-10-CM

## 2018-08-01 MED ORDER — METOPROLOL TARTRATE 25 MG PO TABS: ORAL_TABLET | ORAL | 0 refills | Status: DC

## 2018-08-01 NOTE — Patient Instructions (Signed)
Licensed Clinical Social Worker Visit Information  Goals we discussed today:  Goals Addressed            This Visit's Progress   . "I want to keep my medications straight" (pt-stated)       Current Barriers:  [name] Knowledge Deficits related to medication management . Lacks caregiver support.  . Film/video editor.  . Literacy barriers . Transportation barriers . Cognitive Deficits  Nurse Case Manager Clinical Goal(s):  Over the next 30 days, patient will meet with RN Care Manager to address medication management   Interventions:    RN CM contacted [name] regarding client transport need for client appointment with RN CM on 08/05/2018 at 10:00 AM at [place].  [name] collaborated with RN CM and informed RN that [name] would contact [employer], BSW to ask [employer] to set up transport assist for client appointment  [name] sent secure email request to [employer] on 08/01/2018 requesting assistance in arranging transport for client to and from 08/05/2018 appointment with RN CM  Patient Self Care Activities:  . Currently UNABLE TO independently drive. . Attends all scheduled provider appointments with arranged transportation.   Plan: Amber Chrismon, BSW to arrange transport for client to attend 08/05/2018 10:00 AM appointment with RN CM at [place] to attend 08/05/2018 10:00 AM appointment with RN CM at [facility]  Initial goal documentation     Materials Provided: No  Follow Up Plan: Client to attend 08/05/2018 10:00 AM appointment with [facility] at [facility]  The patient verbalized understanding of instructions provided today and declined a print copy of patient instruction materials.   [name].Amairany Schumpert MSW, [name] Licensed Clinical Social Worker [facility] Care Management [phone]

## 2018-08-01 NOTE — Patient Instructions (Signed)
Visit Information  Goals Addressed            This Visit's Progress   . "I quit smoking around 06/06/2018 and I want to make sure that I don't start back" (pt-stated)       Current Barriers:  [name] Knowledge Deficits related to smoking cessation resources . Lacks caregiver support.  Lauralyn Primes barriers  Nurse Case Manager Clinical Goal(s):  [name] Over the next 60 days patient continue smoking cessation  Interventions:  . Provided encouragement and praise . Explained how the body changes at different intervals after smoking cessation  Patient Self Care Activities:  . Currently UNABLE TO independently perform all ADLs or drive . self administers medications as prescribed . Attends church or other social activities . Calls provider office for new concerns or questions  Plan:  . Patient will reach out to me or [name] if doesn't feel like he might smoke . RNCM will remain available as needed and will follow-up over the next 30 days   Please see past updates related to this goal by clicking on the "Past Updates" button in the selected goal    [name], RN-BC, BSN Nurse Case Manager [place] [phone]        . "I want to keep my medications straight" (pt-stated)       Current Barriers:  [name] Knowledge Deficits related to medication management . Lacks caregiver support.  . Film/video editor.  . Literacy barriers . Transportation barriers . Cognitive Deficits  Nurse Case Manager Clinical Goal(s):  [name] Over the next 30 days, patient will meet with RN Care Manager to address medication management .  Interventions:  . Advised patient to ask to speak with me directly when he calls into the office with any questions or concerns regarding his medications or his health. We will be able to manage things better if we limit the number of staff thta are involved in taking messages and making changes to his care.  . Reviewed medications with patient and  discussed metoprolol dosage change.  . Discussed plans with patient for ongoing care management follow up and provided patient with direct contact information for care management team  Patient Self Care Activities:  . Currently UNABLE TO independently drive. . Attends all scheduled provider appointments with arranged transportation.   Initial goal documentation      . "I want to make sure my blood pressure stays under control" (pt-stated)       Current Barriers:  [name] Knowledge Deficits related to blood pressure readings and management . Lacks caregiver support.   . Cognitive deficits . illiterate  Nurse Case Manager Clinical Goal(s):  [name] Over the next 7 days, Patient will verbalize understanding of plan to manage his blood pressure . Over the next 7 days, patient will verbalize understanding of the need to check and record his blood pressure twice daily . Over the next 7 days, patient will verbalize understanding of blood pressure medication changes  Interventions:  . Spoke with patient via telephone . Questioned about recent symptoms. Denies any headaches or dizziness.  [name] about home blood pressure readings. Patient reports readings of: Date AM BP PM BP  07/26/18  117/72  07/27/18 154/97 153/82  (He has more recent readings but the way he recorded them was confusing)   Reviewed medications. Patient has prepackaged medications from [facility]. Recent changes were d/c lisinopril and cutting metoprolol to daily instead of twice daily with a plan to d/c  completley after 1 week.  Consulted with Dr Warrick Parisian, Dr Verlin Grills covering provider, regarding blood pressure management. For short-term management, Dr Dettinger recommended to continue Metoprolol 25mg  1/2 tablet BID and to continue to check blood pressure daily  Because he has prepackaged pill packs, he is not able to adjust his dosage himself and does not have extra metoprolol on hand. I will order Metoprolol 25mg  1/2 tab  BID # 5 to last until his new pack pill pack comes out on Saturday.  Spoke with patient by telephone. Advised to continue checking blood pressure twice daily and to write it down.  Scheduled face to face appt with me for 08/05/2018. Advised to bring ALL medications, including the package from [place].  We will arrange for transportation   Explained that he will be getting a bottle of metoprolol with 5 pills and that he should take one twice a day with his other medications.   Patient Self Care Activities:  . Currently UNABLE TO independently completely bathe, drive, perform housekeeping duties*  . He is able to take his prepackaged medications on his own . Prepares his own meals  Please see past updates related to this goal by clicking on the "Past Updates" button in the selected goal          The patient verbalized understanding of instructions provided today and declined a print copy of patient instruction materials.   Face to Face appointment with CCM team member scheduled foR: 08/05/2018   [name], RN-BC, BSN Nurse Case Manager [place] [phone]

## 2018-08-01 NOTE — Chronic Care Management (AMB) (Signed)
  Care Management Note   [name] is a 63 y.o. year old male who is a primary care patient of [name], [name], MD. The CM team was consulted for assistance with chronic disease management and care coordination.   I reached out to [place] by phone today.   Review of patient status, including review of consultants reports, relevant laboratory and other test results, and collaboration with appropriate care team members and the patient's provider was performed as part of comprehensive patient evaluation and provision of chronic care management services.   Social Determinants of Health; Client is illiterate; client has difficulty understanding the administration of his prescribed medications.   Goals Addressed            This Visit's Progress   . "I want to keep my medications straight" (pt-stated)       Current Barriers:  [name] Knowledge Deficits related to medication management . Lacks caregiver support.  . Film/video editor.  . Literacy barriers . Transportation barriers . Cognitive Deficits  Nurse Case Manager Clinical Goal(s):  [name] Over the next 30 days, patient will meet with RN Care Manager to address medication management . Interventions:    RN CM contacted [name] regarding client transport need for client appointment with RN CM on 08/05/2018 at 10:00 AM at [facility].  [name] collaborated with RN CM and informed RN that [name] would contact [employer], BSW to ask [employer] to set up transport assist for client appointment  [name] sent secure email request to [employer] on 08/01/2018 requesting assistance in arranging transport for client to and from 08/05/2018 appointment with RN CM  Patient Self Care Activities:  . Currently UNABLE TO independently drive. . Attends all scheduled provider appointments with arranged transportation.   Plan:  Amber Chrismon, BSW to arrange transport for client to attend 08/05/2018 10:00 AM appointment with RN CM at [place] to attend  08/05/2018 10:00 AM appointment with RN CM at [facility]  Initial goal documentation    Follow Up Plan: Client to attend 08/05/2018 10:00 AM client appointment with RN CM at [place].Nasira Janusz MSW, [name] Licensed Clinical Social Worker [facility] Care Management [phone]

## 2018-08-01 NOTE — Chronic Care Management (AMB) (Signed)
Chronic Care Management   Follow Up Note   08/01/2018 Name: [name] MRN: [id number] DOB: [birth year]  Referred by: James Fraise, MD Reason for referral : Chronic Care Management (Medication management)   [name] is a 63 y.o. year old male who is a primary care patient of [name], [name], MD. The CCM team was consulted for assistance with chronic disease management and care coordination needs.    Review of patient status, including review of consultants reports, relevant laboratory and other test results, and collaboration with appropriate care team members and the patient's provider was performed as part of comprehensive patient evaluation and provision of chronic care management services.    Goals Addressed    . "I want to make sure my blood pressure stays under control" (pt-stated)       Current Barriers:  [name] Knowledge Deficits related to blood pressure readings and management . Lacks caregiver support.   . Cognitive deficits . illiterate  Nurse Case Manager Clinical Goal(s):  [name] Over the next 7 days, Patient will verbalize understanding of plan to manage his blood pressure . Over the next 7 days, patient will verbalize understanding of the need to check and record his blood pressure twice daily . Over the next 7 days, patient will verbalize understanding of blood pressure medication changes  Interventions:  . Spoke with patient via telephone . Questioned about recent symptoms. Denies any headaches or dizziness.  [name] about home blood pressure readings. Patient reports readings of: Date AM BP PM BP  07/26/18  117/72  07/27/18 154/97 153/82  (He has more recent readings but the way he recorded them was confusing)   Reviewed medications. Patient has prepackaged medications from [facility]. Recent changes were d/c lisinopril and cutting metoprolol to daily instead of twice daily with a plan to d/c completley after 1 week.  Consulted with [name], James  Verlin Hale covering provider, regarding blood pressure management. For short-term management, [name] recommended to continue Metoprolol 25mg  1/2 tablet BID and to continue to check blood pressure daily  Because he has prepackaged pill packs, he is not able to adjust his dosage himself and does not have extra metoprolol on hand. I will order Metoprolol 25mg  1/2 tab BID # 5 to last until his new pack pill pack comes out on Saturday.  Spoke with patient by telephone. Advised to continue checking blood pressure twice daily and to write it down.  Scheduled face to face appt with me for 08/05/2018. Advised to bring ALL medications, including the package from [name].  We will arrange for transportation   Explained that he will be getting a bottle of metoprolol with 5 pills split in half and that he should take one twice a day with his other medications.   Patient Self Care Activities:  . Currently UNABLE TO independently completely bathe, drive, perform housekeeping duties*  . He is able to take his prepackaged medications on his own . Prepares his own meals  Please see past updates related to this goal by clicking on the "Past Updates" button in the selected goal           . "I quit smoking around 06/06/2018 and I want to make sure that I don't start back" (pt-stated)       Current Barriers:  [name] Knowledge Deficits related to smoking cessation resources . Lacks caregiver support.  [name] barriers  Nurse Case Manager Clinical Goal(s):  [name] Over the next 60 days patient continue  smoking cessation  Interventions:  . Provided encouragement and praise . Explained how the body changes at different intervals after smoking cessation  Patient Self Care Activities:  . Currently UNABLE TO independently perform all ADLs or drive . self administers medications as prescribed . Attends church or other social activities . Calls provider office for new concerns or questions  Plan:  . Patient  will reach out to me or [name] if doesn't feel like he might smoke . RNCM will remain available as needed and will follow-up over the next 30 days   Please see past updates related to this goal by clicking on the "Past Updates" button in the selected goal       . "I want to keep my medications straight" (pt-stated)       Current Barriers:  [name] Knowledge Deficits related to medication management . Lacks caregiver support.  . Film/video editor.  . Literacy barriers . Transportation barriers . Cognitive Deficits  Nurse Case Manager Clinical Goal(s):  [name] Over the next 30 days, patient will meet with RN Care Manager to address medication management .  Interventions:  . Advised patient to ask to speak with me directly when he calls into the office with any questions or concerns regarding his medications or his health. We will be able to manage things better if we limit the number of staff thta are involved in taking messages and making changes to his care.  . Reviewed medications with patient and discussed metoprolol dosage change.  . Discussed plans with patient for ongoing care management follow up and provided patient with direct contact information for care management team  Patient Self Care Activities:  . Currently UNABLE TO independently drive. . Attends all scheduled provider appointments with arranged transportation.        Meds ordered this encounter  Medications  . metoprolol tartrate (LOPRESSOR) 25 MG tablet    Sig: Take 1/2 tablet by mouth two times a day for the next 5 days    Dispense:  5 tablet    Refill:  0    Can you split these in half and deliver today? He's been instructed to take 1/2 tablet twice a day until Saturday when his new pack is delivered. I'll place a new order once I see his BP readings.    Follow Up Plan Face to Face appointment with CCM team member scheduled foR: 08/05/2018   James Sicilian, RN-BC, BSN Nurse Case Manager [name] [phone]

## 2018-08-02 ENCOUNTER — Telehealth: Payer: Medicare Other

## 2018-08-02 ENCOUNTER — Other Ambulatory Visit: Payer: Self-pay

## 2018-08-02 NOTE — Patient Outreach (Signed)
[place] [facility]) Care Management  08/02/2018  [name] 1955-05-11 [id number]   Transportation arranged via [place] for appointment at [facility] on 08/05/18 @ 10:00 AM.  [name], [place] Worker [phone]

## 2018-08-03 DIAGNOSIS — Z7951 Long term (current) use of inhaled steroids: Secondary | ICD-10-CM | POA: Diagnosis not present

## 2018-08-03 DIAGNOSIS — J449 Chronic obstructive pulmonary disease, unspecified: Secondary | ICD-10-CM | POA: Diagnosis not present

## 2018-08-03 DIAGNOSIS — I2510 Atherosclerotic heart disease of native coronary artery without angina pectoris: Secondary | ICD-10-CM | POA: Diagnosis not present

## 2018-08-03 DIAGNOSIS — J209 Acute bronchitis, unspecified: Secondary | ICD-10-CM | POA: Diagnosis not present

## 2018-08-03 DIAGNOSIS — I119 Hypertensive heart disease without heart failure: Secondary | ICD-10-CM | POA: Diagnosis not present

## 2018-08-04 ENCOUNTER — Telehealth: Payer: Medicare Other

## 2018-08-05 ENCOUNTER — Ambulatory Visit: Payer: Medicare Other | Admitting: *Deleted

## 2018-08-05 ENCOUNTER — Other Ambulatory Visit: Payer: Self-pay

## 2018-08-05 ENCOUNTER — Ambulatory Visit: Payer: Medicare Other | Admitting: Family Medicine

## 2018-08-05 VITALS — BP 129/77 | HR 100

## 2018-08-05 DIAGNOSIS — I119 Hypertensive heart disease without heart failure: Secondary | ICD-10-CM | POA: Diagnosis not present

## 2018-08-05 DIAGNOSIS — Z7951 Long term (current) use of inhaled steroids: Secondary | ICD-10-CM | POA: Diagnosis not present

## 2018-08-05 DIAGNOSIS — I10 Essential (primary) hypertension: Secondary | ICD-10-CM

## 2018-08-05 DIAGNOSIS — J209 Acute bronchitis, unspecified: Secondary | ICD-10-CM | POA: Diagnosis not present

## 2018-08-05 DIAGNOSIS — I25118 Atherosclerotic heart disease of native coronary artery with other forms of angina pectoris: Secondary | ICD-10-CM | POA: Diagnosis not present

## 2018-08-05 DIAGNOSIS — I209 Angina pectoris, unspecified: Secondary | ICD-10-CM | POA: Diagnosis not present

## 2018-08-05 DIAGNOSIS — Z550 Illiteracy and low-level literacy: Secondary | ICD-10-CM

## 2018-08-05 DIAGNOSIS — I2510 Atherosclerotic heart disease of native coronary artery without angina pectoris: Secondary | ICD-10-CM | POA: Diagnosis not present

## 2018-08-05 DIAGNOSIS — J449 Chronic obstructive pulmonary disease, unspecified: Secondary | ICD-10-CM | POA: Diagnosis not present

## 2018-08-05 MED ORDER — METOPROLOL TARTRATE 25 MG PO TABS: ORAL_TABLET | ORAL | 0 refills | Status: DC

## 2018-08-05 NOTE — Patient Instructions (Signed)
Visit [facility] is going to bring our your medication today.   Take 1/2 tablet of metoprolol two times a day until Monday.   Your pill pack will start on Monday and has the metoprolol in it already.   Check you blood pressure twice a day and write it down.  Example: 122/85  Pulse 89  Print copy of patient instructions provided.   I will follow up with you be telephone in one week.   [name], RN-BC, BSN Nurse Case Manager [place] [phone]

## 2018-08-05 NOTE — Chronic Care Management (AMB) (Addendum)
Chronic Care Management   Face to Face Consult Note  08/05/2018 Name: [name] MRN: [id number] DOB: 1955-09-04  Referred by: [name], MD Reason for referral : Chronic Care Management (Face to Face to help with medications)   [name] is a 63 y.o. year old male who sees [name], MD for primary care. The CM team was consulted for assistance with care coordination and chronic disease management. I met with [name] face to face today. The primary goal of today's meeting was to discuss blood pressure management, to review his current medications, and to sort out the confusion with his pharmacy. Other areas of his health and healthcare were reviewed for changes as well.    SUBJECTIVE [name] received a box of medication from [facility]. They are prepackaged for 30 days but are not separated by morning and evening doses. [name] states that he wants to continue using [facility] because they deliver his medication once a week and that it is easier to understand because they separate the medication by day and by time of day. He asked that we dispose of the medication from [facility] because he doesn't want to get it mixed up. States that the pills look different than what he is used to and he doesn't trust it.   Review of Systems Cardiac: denies recent chest pain and no episodes of being lightheaded or dizzy since visit with PCP on 07/22/2018 Respiratory: Some SOB with activity and mild unproductive cough at times Other Systems: negative    OBJECTIVE [name] is alert, oriented, and cooperative. He is well groomed and neatly dressed today and his speech and affect are normal.   In Office BP:  BP 129/77 (BP Location: Right Arm, Patient Position: Sitting, Cuff Size: Large)   Pulse 100    Chart review indicates that Lisinopril was d/c at office visit on 07/22/18 and metoprolol was decreased from 25 mg BID to 1/2 tablet BID for one week and then discontinued. Home  home blood pressure readings provided since 07/22/18 have been elevated with most being above 140/85. See previous CCM notes. Order to continue Metoprolol 20m 1/2 tablet BID received from covering provider, Dr Hale earlier in the week.   Current medications, allergies, medical history, and social history were  reviewed.   Medication Sig Dispense  . acetaminophen (TYLENOL) 325 MG tablet Take 2 tablets (650 mg total) by mouth every 6 (six) hours as needed for mild pain or headache.   . albuterol (PROAIR HFA) 108 (90 Base) MCG/ACT inhaler 1-2 puffs every 6 hours as needed wheezing or shortness of breath. 17 g  . aspirin EC 81 MG tablet Take 1 tablet (81 mg total) by mouth daily. 30 tablet  . atorvastatin (LIPITOR) 40 MG tablet Take 1 tablet (40 mg total) by mouth daily at 6 PM. 90 tablet  . buPROPion (WELLBUTRIN SR) 150 MG 12 hr tablet Take 1 tablet (150 mg total) by mouth 2 (two) times daily. 180 tablet  . DULoxetine (CYMBALTA) 30 MG capsule Take 2 capsules (60 mg total) by mouth daily. WITH A FULL STOMACH AT SUPPER TIME 180 capsule  . fexofenadine (ALLEGRA) 180 MG tablet Take 1 tablet (180 mg total) by mouth daily. For allergy symptoms 90 tablet  . fluticasone (FLONASE) 50 MCG/ACT nasal spray Place 2 sprays into both nostrils daily as needed for allergies or rhinitis. 48 g  . fluticasone furoate-vilanterol (BREO ELLIPTA) 100-25 MCG/INH AEPB Inhale 1 puff into the lungs daily.  90 each  . Ipratropium-Albuterol (COMBIVENT RESPIMAT) 20-100 MCG/ACT AERS respimat Inhale 1 puff into the lungs every 6 (six) hours for 30 days. 4 g  . isosorbide mononitrate (IMDUR) 30 MG 24 hr tablet Take 1 tablet (30 mg total) by mouth daily. 90 tablet  . nitroGLYCERIN (NITROSTAT) 0.4 MG SL tablet Place 1 tablet (0.4 mg total) under the tongue every 5 (five) minutes x 3 doses as needed for chest pain. 25 tablet  . pantoprazole (PROTONIX) 40 MG tablet Take 1 tablet (40 mg total) by mouth 2 (two) times daily. 180 tablet  .  tamsulosin (FLOMAX) 0.4 MG CAPS capsule TAKE (1) CAPSULE DAILY 90 capsule     ASSESSMENT & PLAN  General Assessment SDOH reviewed and updated. Patient is at risk for social isolation and has stress concerns. He is connected with the [name] in the [place].   Patient Goals Addressed    . "I want to keep my medications straight" (pt-stated)       Current Barriers:  [name] Knowledge Deficits related to medication management . Lacks caregiver support.  . Film/video editor.  . Literacy barriers . Transportation barriers . Cognitive Deficits  Nurse Case Manager Clinical Goal(s):  [name] Over the next 30 days, patient will continue to work with [facility], PCP, and [place] to manage his medications.  . Over the next 30 days, patient will reach out to [facility] directly with any questions or concerns regarding his medications.   Interventions:   RNCM met with [name] face to face in the office to review current medications  RNCM collaborated with covering provider and pharmacy regarding new orders (see goal regardig blood pressure management for details)  RNCM faxed order to discontinue all prescriptions on file and to suspend mailorder (see letter in communications)  RNCM made created care coordination note advising to only use [facility] for medication orders unless patient specifically requests a change  Provided patient with drawing of how to take metoprolol for the next 3 days  Patient Self Care Activities:  . Currently UNABLE TO independently drive. . Attends all scheduled provider appointments with arranged transportation.      [name] "I want to make sure my blood pressure stays under control" (pt-stated)       Current Barriers:  [name] Knowledge Deficits related to blood pressure readings and management . Lacks caregiver support.   . Cognitive deficits . illiterate  Nurse Case Manager Clinical Goal(s):  [name] Over the next 7 days, Patient will continue to take medication as  prescribed o Medication is prepackaged by [facility] and he will have a bottle of loose Metoprolol pills to last until the new pack starts on Monday . Over the next 7 days, patient will verbalize understanding of the need to check and record his blood pressure twice daily . Over the next 7 days, patient will verbalize understanding of blood pressure medication changes . Over the next 7 days, patient will report any side effects (lightheadnedness, dizziness, etc)  Interventions:  [name] Met with [name] face to face in the office . Reviewed his medications and looked over the medicines that he brought in with him.  . Verified that he would like to continue using [facility] and that he does not want to use [facility]. . Unable to speak with a [place] representative by phone due to extended wait times. I did fax an order to discontinue his prescriptions and to suspend mailorder (see letter in communications) . Blood pressure  checked in office and was 129/77, P 100 . Questioned about recent symptoms. Denies any headaches or dizziness.   Questioned about home blood pressure readings. Patient reports readings. He has been checking and recording them twice a day but left the log at home.   Ordered Metoprolol 12m 1/2 tablet twice daily per Dr Hale  [facility].  Today they will deliver a loose bottle of split pills to last until he starts his new pack on Monday  Starting Monday the pill pack will contain 1/2 tablet of metoprolol 23mtwice daily  Patient Self Care Activities:  . Currently UNABLE TO independently completely bathe, drive, perform housekeeping duties*  . He is able to take his prepackaged medications on his own . Prepares his own meals    . "I quit smoking around 06/06/2018 and I want to make sure that I don't start back" (pt-stated)      Current Barriers:  . Marland Kitchennowledge Deficits related to smoking cessation resources  . Lacks caregiver support.  . Lauralyn Primesarriers  Nurse Case Manager Clinical Goal(s):  . Marland Kitchenver the next 60 days patient continue smoking cessation  Interventions:  . Continued to provide encouragement and praise . Explained how the body changes at different intervals after smoking cessation . Discussed the short-term and long-term role that cigarettes and nicotine play in vasoconstriction and suggested that smoking cessation may play a role in lowering his blood pressure.   Patient Self Care Activities:  . Currently UNABLE TO independently perform all ADLs or drive . self administers medications as prescribed . Attends church or other social activities . Calls provider office for new concerns or questions     . "I want to lose some of this weight" (pt-stated)      Current Barriers:  . Literacy barriers  . Knowledge deficit related to proper weight management techniques  Nurse Case Manager Clinical Goal(s):  . Marland Kitchenver the next 30 days, patient will verbalize understanding of plan for weight loss  . Over the next 30 days patient will state understanding of need for weight management and it's health benefits . Over the next 60 days patient will experience a weight loss of 4 to 8 pounds.    Interventions:  . Discussed current diet . Plan to discuss further at future visit   Patient Self Care Activities:  . Currently UNABLE TO independently perform all ADLs independently . Self administers medications as prescribed (in pill pack) . Calls provider office for new concerns or questions     "I need to figure out what is going on with my heart" (pt-stated)     Nurse Case Manager Clinical Goal(s): Over the next 6 months patient will demonstrate self-management of chest pain and will have no ED for angina   Interventions:   Discussed with patient. No recent issues with chest pain.   RNCM will continue to remain available as needed.  Advised to continue taking medications as  prescribed  [facility] or [facility]  Call 911 if he does have chest pain that is unrelieved with nitro or if he has to take 3 nitro tablets  Patient Self Care Activities:  . Patient monitors chest pain and reports  . Patient self administers medications . Patient calls the office with questions or concerns          Meds ordered this encounter  . metoprolol tartrate (LOPRESSOR) 25 MG tablet  Sig: Take 1/2 tablet by mouth two times a day and include in pill packs    Dispense:  30 tablet    Refill:  0    Follow Up Plan RNCM will reach out to patient by telephone over the next two weeks Patient will reach out to CCM team as needed   [name], RN-BC, BSN Nurse Case Manager [place] [phone]

## 2018-08-09 ENCOUNTER — Ambulatory Visit: Payer: Self-pay | Admitting: Licensed Clinical Social Worker

## 2018-08-09 DIAGNOSIS — I252 Old myocardial infarction: Secondary | ICD-10-CM | POA: Diagnosis not present

## 2018-08-09 DIAGNOSIS — R482 Apraxia: Secondary | ICD-10-CM

## 2018-08-09 DIAGNOSIS — G809 Cerebral palsy, unspecified: Secondary | ICD-10-CM

## 2018-08-09 DIAGNOSIS — E785 Hyperlipidemia, unspecified: Secondary | ICD-10-CM

## 2018-08-09 DIAGNOSIS — Z550 Illiteracy and low-level literacy: Secondary | ICD-10-CM

## 2018-08-09 DIAGNOSIS — I10 Essential (primary) hypertension: Secondary | ICD-10-CM | POA: Diagnosis not present

## 2018-08-09 DIAGNOSIS — F411 Generalized anxiety disorder: Secondary | ICD-10-CM

## 2018-08-09 DIAGNOSIS — I209 Angina pectoris, unspecified: Secondary | ICD-10-CM

## 2018-08-09 DIAGNOSIS — I25118 Atherosclerotic heart disease of native coronary artery with other forms of angina pectoris: Secondary | ICD-10-CM | POA: Diagnosis not present

## 2018-08-09 NOTE — Chronic Care Management (AMB) (Signed)
  Care Management Note   [name] is a 63 y.o. year old male who is a primary care patient of [name], James Gash, MD. The CM team was consulted for assistance with chronic disease management and care coordination.   I reached out to [name] by phone today.   Review of patient status, including review of consultants reports, relevant laboratory and other test results, and collaboration with appropriate care team members and the patient's provider was performed as part of comprehensive patient evaluation and provision of chronic care management services.   Social Determinants of Health:Risk for Stress; Risk for food insecurity. Client is illiterate and has difficulty understanding the administration schedule for his prescribed medications. He has been talking with RNCM about his medication questions.  GAD 7 : Generalized Anxiety Score 05/01/2016 03/31/2016  Nervous, Anxious, on Edge 1 3  Control/stop worrying 1 3  Worry too much - different things 1 3  Trouble relaxing 0 2  Restless 0 2  Easily annoyed or irritable 0 0  Afraid - awful might happen 0 0  Total GAD 7 Score 3 13  Anxiety Difficulty Not difficult at all Not difficult at all   Goals Addressed            This Visit's Progress   . : " I need to get through all this legal stuff " (pt-stated)       Clinical Goal(s):  Over the next 30 days, patient will affirm attendance to scheduled court hearing/mediation session and verbalize understanding of any ongoing legal obligations.   Interventions:  Encouraged patient to stay in contact with his attorney [name] again reminded client that he could schedule [facility] area [employer] as needed for attendance at required court hearings or mediation sessions [name] collaborated with [place] regarding nursing needs of client [name] encouraged client to talk with RN CM regarding medications questions of client  Patient Self Care Activities:   Attends  scheduled medical appointments  Takes medications as prescribed  Communicates with attorney to discuss client's legal issues  Communicates with RNCM to discuss nursing needs of client  Plan:  [name] to call client in 3 weeks to discuss with client legal issues client is facing and to encourage client to work with attorney of client regarding client's legal issues.   Client to call RN CM as needed to discuss nursing needs of client     Client to attend scheduled medical appointments    Client to attend next  scheduled client mediation session in [place], [place]  Client to communicate, as needed, with RN CM regarding medication questions of client     Follow Up Plan: [name] to call client in next 3 weeks to discuss with client legal issues client is facing and to encourage client to communicate with his attorney regarding client's legal issues faced.  [name].[name] MSW, [name] Licensed Clinical Social Worker [facility] Care Management [phone]

## 2018-08-09 NOTE — Patient Instructions (Signed)
Licensed Clinical Social Worker Visit Information  Goals we discussed today:  Goals Addressed            This Visit's Progress   . : " I need to get through all this legal stuff " (pt-stated)       Clinical Goal(s):  Over the next 30 days, patient will affirm attendance to scheduled court hearing/mediation session and verbalize understanding of any ongoing legal obligations.   Interventions:  Confirmed that patient remains in contact with his attorney [name] again reminded client that he could schedule [facility] area Trans[facility] as needed for attendance at required court hearings or mediation sessions [name] collaborated with [place] regarding nursing needs of client [name] encouraged client to talk with RN CM regarding medications questions of client  Patient Self Care Activities:   Attends scheduled medical appointments  Takes medications as prescribed  Communicates with attorney to discuss client's legal issues  Communicates with RNCM to discuss nursing needs of client  Plan:  [name] to call client in 3 weeks to discuss with client legal issues client is facing and to encourage client to work with attorney of client regarding client's legal issues.   Client to call RN CM as needed to discuss nursing needs of client     Client to attend scheduled medical appointments    Client to attend next  scheduled client mediation session in [place], [place]  Client to communicate, as needed, with RN CM regarding medication questions of client     Materials Provided: No  Follow Up Plan: [name] to call client in next 3 weeks to discuss with client legal issues client is facing and to encourage client to work with attorney of client regarding client's legal issues.  The patient verbalized understanding of instructions provided today and declined a print copy of patient instruction materials.   [name].Raylei Losurdo MSW, [name] Licensed Clinical Social Worker [facility] Care Management [phone]

## 2018-08-10 DIAGNOSIS — J449 Chronic obstructive pulmonary disease, unspecified: Secondary | ICD-10-CM | POA: Diagnosis not present

## 2018-08-10 DIAGNOSIS — I119 Hypertensive heart disease without heart failure: Secondary | ICD-10-CM | POA: Diagnosis not present

## 2018-08-10 DIAGNOSIS — Z7951 Long term (current) use of inhaled steroids: Secondary | ICD-10-CM | POA: Diagnosis not present

## 2018-08-10 DIAGNOSIS — I2510 Atherosclerotic heart disease of native coronary artery without angina pectoris: Secondary | ICD-10-CM | POA: Diagnosis not present

## 2018-08-10 DIAGNOSIS — J209 Acute bronchitis, unspecified: Secondary | ICD-10-CM | POA: Diagnosis not present

## 2018-08-12 DIAGNOSIS — I2510 Atherosclerotic heart disease of native coronary artery without angina pectoris: Secondary | ICD-10-CM | POA: Diagnosis not present

## 2018-08-12 DIAGNOSIS — I119 Hypertensive heart disease without heart failure: Secondary | ICD-10-CM | POA: Diagnosis not present

## 2018-08-12 DIAGNOSIS — J449 Chronic obstructive pulmonary disease, unspecified: Secondary | ICD-10-CM | POA: Diagnosis not present

## 2018-08-12 DIAGNOSIS — J209 Acute bronchitis, unspecified: Secondary | ICD-10-CM | POA: Diagnosis not present

## 2018-08-12 DIAGNOSIS — Z7951 Long term (current) use of inhaled steroids: Secondary | ICD-10-CM | POA: Diagnosis not present

## 2018-08-16 ENCOUNTER — Ambulatory Visit: Payer: Medicare Other | Admitting: *Deleted

## 2018-08-16 ENCOUNTER — Other Ambulatory Visit: Payer: Self-pay

## 2018-08-17 ENCOUNTER — Other Ambulatory Visit: Payer: Self-pay

## 2018-08-17 ENCOUNTER — Ambulatory Visit: Payer: Self-pay | Admitting: Licensed Clinical Social Worker

## 2018-08-17 DIAGNOSIS — J449 Chronic obstructive pulmonary disease, unspecified: Secondary | ICD-10-CM | POA: Diagnosis not present

## 2018-08-17 DIAGNOSIS — R482 Apraxia: Secondary | ICD-10-CM

## 2018-08-17 DIAGNOSIS — Z7951 Long term (current) use of inhaled steroids: Secondary | ICD-10-CM | POA: Diagnosis not present

## 2018-08-17 DIAGNOSIS — I252 Old myocardial infarction: Secondary | ICD-10-CM

## 2018-08-17 DIAGNOSIS — Z550 Illiteracy and low-level literacy: Secondary | ICD-10-CM

## 2018-08-17 DIAGNOSIS — J209 Acute bronchitis, unspecified: Secondary | ICD-10-CM | POA: Diagnosis not present

## 2018-08-17 DIAGNOSIS — G809 Cerebral palsy, unspecified: Secondary | ICD-10-CM

## 2018-08-17 DIAGNOSIS — E785 Hyperlipidemia, unspecified: Secondary | ICD-10-CM

## 2018-08-17 DIAGNOSIS — F411 Generalized anxiety disorder: Secondary | ICD-10-CM

## 2018-08-17 DIAGNOSIS — I25118 Atherosclerotic heart disease of native coronary artery with other forms of angina pectoris: Secondary | ICD-10-CM

## 2018-08-17 DIAGNOSIS — I119 Hypertensive heart disease without heart failure: Secondary | ICD-10-CM | POA: Diagnosis not present

## 2018-08-17 DIAGNOSIS — I2510 Atherosclerotic heart disease of native coronary artery without angina pectoris: Secondary | ICD-10-CM | POA: Diagnosis not present

## 2018-08-17 NOTE — Patient Outreach (Signed)
[place] [facility]) Care Management  08/17/2018  Jantzen Pilger Barca Nov 25, 1955 [id number]  Request received from [place], [name], to arrange transportation to appointment with Dr. [name] on 08/18/18 @ 9:10 AM.  Transportation arranged via [place], [place] Worker [phone]

## 2018-08-17 NOTE — Patient Instructions (Signed)
Licensed Clinical Water engineer Provided: No  Client had called [name] today to inform [name] that client had experienced a fall recently and was having knee pain. Client has contacted office of Dr. Livia Snellen and client is scheduled for an appointment with Dr. Livia Snellen on 08/18/2018 at 9:10 AM. [name] contacted Amber Chrismon., BSW, on 08/17/2018 and requested Amber make transport arrangements for client to go to and from appointment with Dr. Livia Snellen on 08/18/2018. Amber said she would schedule transport assistance for client and would call client to inform client when transport plans had been made  Follow Up Plan: [name] to continue to collaborate with RN CM to monitor client needs.  The patient verbalized understanding of instructions provided today and declined a print copy of patient instruction materials.   [name].Miakoda Mcmillion MSW, [name] Licensed Clinical Social Worker [facility] Care Management [phone]

## 2018-08-17 NOTE — Chronic Care Management (AMB) (Signed)
  Care Management Note   [name] is a 63 y.o. year old male who is a primary care patient of [name], [name], MD. The CM team was consulted for assistance with chronic disease management and care coordination.   I reached out to [name] by phone today.   Review of patient status, including review of consultants reports, relevant laboratory and other test results, and collaboration with appropriate care team members and the patient's provider was performed as part of comprehensive patient evaluation and provision of chronic care management services.   Client had called [name] today to inform [name] that client had experienced a fall recently and was having knee pain. Client has contacted office of Dr. Livia Snellen and client is scheduled for an appointment with Dr. Livia Snellen on 08/18/2018 at 9:10 AM. [name] contacted Amber Chrismon., BSW, on 08/17/2018 and requested Amber make transport arrangements for client to go to and from appointment with Dr. Livia Snellen on 08/18/2018. Amber said she would schedule transport assistance for client and would call client to inform client when transport plans had been made  Follow Up Plan: [name] to continue to collaborate with RN CM to monitor ongoing needs of client.  [name].Cayleigh Paull MSW, [name] Licensed Clinical Social Worker [facility] Care Management [phone]

## 2018-08-18 ENCOUNTER — Other Ambulatory Visit: Payer: Self-pay

## 2018-08-18 ENCOUNTER — Encounter: Payer: Self-pay | Admitting: Family Medicine

## 2018-08-18 ENCOUNTER — Ambulatory Visit (INDEPENDENT_AMBULATORY_CARE_PROVIDER_SITE_OTHER): Payer: Medicare Other

## 2018-08-18 ENCOUNTER — Ambulatory Visit: Payer: Medicare Other | Admitting: Family Medicine

## 2018-08-18 ENCOUNTER — Ambulatory Visit (INDEPENDENT_AMBULATORY_CARE_PROVIDER_SITE_OTHER): Payer: Medicare Other | Admitting: Family Medicine

## 2018-08-18 VITALS — BP 123/74 | HR 86 | Temp 99.0°F | Ht 72.0 in | Wt 224.0 lb

## 2018-08-18 DIAGNOSIS — M25561 Pain in right knee: Secondary | ICD-10-CM | POA: Diagnosis not present

## 2018-08-18 DIAGNOSIS — S8991XA Unspecified injury of right lower leg, initial encounter: Secondary | ICD-10-CM | POA: Diagnosis not present

## 2018-08-18 DIAGNOSIS — M25562 Pain in left knee: Secondary | ICD-10-CM

## 2018-08-18 DIAGNOSIS — M25552 Pain in left hip: Secondary | ICD-10-CM | POA: Diagnosis not present

## 2018-08-18 DIAGNOSIS — S8992XA Unspecified injury of left lower leg, initial encounter: Secondary | ICD-10-CM | POA: Diagnosis not present

## 2018-08-18 DIAGNOSIS — M25551 Pain in right hip: Secondary | ICD-10-CM | POA: Diagnosis not present

## 2018-08-18 MED ORDER — PREDNISONE 20 MG PO TABS: 20.0000 mg | ORAL_TABLET | Freq: Two times a day (BID) | ORAL | 0 refills | Status: DC

## 2018-08-18 NOTE — Progress Notes (Signed)
Chief Complaint  Patient presents with  . Knee Pain    Bilateral. States he fell 4 days ago    HPI  Patient presents today for evaluation of knees. He fell four days ago and now is limping equally in both legs as a result. Pain is severe.   PMH: Smoking status noted ROS: Per HPI  Objective: BP 123/74   Pulse 86   Temp 99 F (37.2 C) (Oral)   Ht 6' (1.829 m)   Wt 224 lb (101.6 kg)   BMI 30.38 kg/m  Gen: NAD, alert, cooperative with exam HEENT: NCAT, EOMI, PERRL CV: RRR, good S1/S2, no murmur Resp: CTABL, no wheezes, non-labored Abd: SNTND, BS present, no guarding or organomegaly Ext: No edema, warm Neuro: Alert and oriented, No gross deficits XR - bilateral medial compartment arthritis of knees Assessment and plan:  1. Acute pain of both knees     Meds ordered this encounter  Medications  . predniSONE (DELTASONE) 20 MG tablet    Sig: Take 1 tablet (20 mg total) by mouth 2 (two) times daily with a meal. For 2weeks    Dispense:  28 tablet    Refill:  0    Orders Placed This Encounter  Procedures  . DG Knee Bilateral Standing AP    Standing Status:   Future    Number of Occurrences:   1    Standing Expiration Date:   10/18/2019    Order Specific Question:   Reason for Exam (SYMPTOM  OR DIAGNOSIS REQUIRED)    Answer:   fall    Order Specific Question:   Preferred imaging location?    Answer:   Internal    Order Specific Question:   Call Results- Best Contact Number?    Answer:   [phone]    Order Specific Question:   Radiology Contrast Protocol - do NOT remove file path    Answer:   [email or web address]    Follow up as needed.  [name], MD

## 2018-08-19 ENCOUNTER — Other Ambulatory Visit: Payer: Self-pay | Admitting: *Deleted

## 2018-08-19 DIAGNOSIS — J209 Acute bronchitis, unspecified: Secondary | ICD-10-CM

## 2018-08-19 DIAGNOSIS — J440 Chronic obstructive pulmonary disease with acute lower respiratory infection: Principal | ICD-10-CM

## 2018-08-21 ENCOUNTER — Encounter: Payer: Self-pay | Admitting: Family Medicine

## 2018-08-22 ENCOUNTER — Ambulatory Visit (INDEPENDENT_AMBULATORY_CARE_PROVIDER_SITE_OTHER): Payer: Medicare Other | Admitting: *Deleted

## 2018-08-22 ENCOUNTER — Ambulatory Visit: Payer: Self-pay | Admitting: Licensed Clinical Social Worker

## 2018-08-22 ENCOUNTER — Encounter: Payer: Self-pay | Admitting: *Deleted

## 2018-08-22 ENCOUNTER — Other Ambulatory Visit: Payer: Self-pay

## 2018-08-22 VITALS — BP 123/71 | HR 84 | Ht 72.0 in | Wt 224.0 lb

## 2018-08-22 DIAGNOSIS — E785 Hyperlipidemia, unspecified: Secondary | ICD-10-CM

## 2018-08-22 DIAGNOSIS — I209 Angina pectoris, unspecified: Secondary | ICD-10-CM

## 2018-08-22 DIAGNOSIS — Z0000 Encounter for general adult medical examination without abnormal findings: Secondary | ICD-10-CM

## 2018-08-22 DIAGNOSIS — I25118 Atherosclerotic heart disease of native coronary artery with other forms of angina pectoris: Secondary | ICD-10-CM

## 2018-08-22 DIAGNOSIS — F411 Generalized anxiety disorder: Secondary | ICD-10-CM

## 2018-08-22 DIAGNOSIS — G809 Cerebral palsy, unspecified: Secondary | ICD-10-CM

## 2018-08-22 DIAGNOSIS — Z550 Illiteracy and low-level literacy: Secondary | ICD-10-CM

## 2018-08-22 DIAGNOSIS — R482 Apraxia: Secondary | ICD-10-CM

## 2018-08-22 DIAGNOSIS — I252 Old myocardial infarction: Secondary | ICD-10-CM

## 2018-08-22 NOTE — Chronic Care Management (AMB) (Signed)
  Care Management Note   [name] is a 63 y.o. year old male who is a primary care patient of [name], [name], MD. The CM team was consulted for assistance with chronic disease management and care coordination.   I reached out to [place] by phone today.   Review of patient status, including review of consultants reports, relevant laboratory and other test results, and collaboration with appropriate care team members and the patient's provider was performed as part of comprehensive patient evaluation and provision of chronic care management services.   Client called [name] to talk with [name] about client's appointment schedule. Client had appointment with [name]  RN today at [facility]. Client said he was taking medications as prescribed . He said his knees were feeling better now from his recent fall. He said he spends time with his pet dog to help him relax. He said he has adequate food supply. He is familiar with [name] services and is able to call [name] for transport assistance as needed. He has some support from his sister.   [name] encouraged client to call RNCM [name] as needed to discuss nursing needs of client.  Follow Up Plan: [name] to call client as scheduled to assess psychosocial needs of client  [name].Bita Cartwright MSW, [name] Licensed Clinical Social Worker [facility] Care [facility] [phone]

## 2018-08-22 NOTE — Progress Notes (Signed)
Medicare Annual Wellness Visit  Subjective:   [name] is a 63 y.o. male who presents for a subsequent Medicare Annual Wellness Visit.  [name] is disabled and lives alone with his small dog.  He lives very close to his sister who checks on him daily.  He enjoys being outside with his dog and working in his yard.  He has 3 sisters living, and they check on him frequently.  He attends church regularly, but has not been able to recently due to [name] 21 restrictions.   Patient Care Team: James Fraise, MD as PCP - General (Family Medicine) James Breeding, MD as PCP - Cardiology (Cardiology) [name], [name] as [place] [facility] [name], James Riffle, [name] as Social Worker (Licensed Clinical Social Worker) James China, RN as Case Manager  Hospitalizations, surgeries, and ER visits in previous 12 months One ER visit resulting in one hospitalization for COPD in the past year. No surgeries reported.  Review of Systems    Patient reports that his overall health is unchanged compared to last year.  Cardiac Risk Factors include: advanced age (>87mn, >>16women);dyslipidemia;hypertension;male gender;obesity (BMI >30kg/m2);sedentary lifestyle;smoking/ tobacco exposure    All other systems negative       Current Medications (verified) Outpatient Encounter Medications as of 08/22/2018  Medication Sig  . acetaminophen (TYLENOL) 325 MG tablet Take 2 tablets (650 mg total) by mouth every 6 (six) hours as needed for mild pain or headache.  . albuterol (PROAIR HFA) 108 (90 Base) MCG/ACT inhaler 1-2 puffs every 6 hours as needed wheezing or shortness of breath.  .Marland Kitchenaspirin EC 81 MG tablet Take 1 tablet (81 mg total) by mouth daily.  .Marland Kitchenatorvastatin (LIPITOR) 40 MG tablet Take 1 tablet (40 mg total) by mouth daily at 6 PM.  . buPROPion (WELLBUTRIN SR) 150 MG 12 hr tablet Take 1 tablet (150 mg total) by mouth 2 (two) times daily.  . DULoxetine (CYMBALTA) 30 MG capsule  Take 2 capsules (60 mg total) by mouth daily. WITH A FULL STOMACH AT SUPPER TIME  . fexofenadine (ALLEGRA) 180 MG tablet Take 1 tablet (180 mg total) by mouth daily. For allergy symptoms  . fluticasone (FLONASE) 50 MCG/ACT nasal spray Place 2 sprays into both nostrils daily as needed for allergies or rhinitis.  . fluticasone furoate-vilanterol (BREO ELLIPTA) 100-25 MCG/INH AEPB Inhale 1 puff into the lungs daily.  . Ipratropium-Albuterol (COMBIVENT RESPIMAT) 20-100 MCG/ACT AERS respimat Inhale 1 puff into the lungs every 6 (six) hours for 30 days.  . isosorbide mononitrate (IMDUR) 30 MG 24 hr tablet Take 1 tablet (30 mg total) by mouth daily.  . metoprolol tartrate (LOPRESSOR) 25 MG tablet Take 1/2 tablet by mouth two times a day and include in pill packs  . nitroGLYCERIN (NITROSTAT) 0.4 MG SL tablet Place 1 tablet (0.4 mg total) under the tongue every 5 (five) minutes x 3 doses as needed for chest pain.  . pantoprazole (PROTONIX) 40 MG tablet Take 1 tablet (40 mg total) by mouth 2 (two) times daily.  . predniSONE (DELTASONE) 20 MG tablet Take 1 tablet (20 mg total) by mouth 2 (two) times daily with a meal. For 2weeks  . tamsulosin (FLOMAX) 0.4 MG CAPS capsule TAKE (1) CAPSULE DAILY   No facility-administered encounter medications on file as of 08/22/2018.     Allergies (verified) [name] [varenicline tartrate]   History: Past Medical History:  Diagnosis Date  . Cerebral palsy ([place]   . Coronary artery disease  LAD 70% stenosis, cath 2019  . Depression   . GERD (gastroesophageal reflux disease)   . Hypertension   . Scoliosis   . Seizures ([place])    pt's sister states that patinet has had seizures in the past, pt camr for PAT by himself on last visit and she stst "they misunderstood what he was trying to say. Pt saw neurologist in March who did CT and states patient does not have seizures. On no meds, had seizures as child.   Past Surgical History:  Procedure Laterality Date  . CARPAL  TUNNEL RELEASE Right 08/13/2016   Procedure: CARPAL TUNNEL RELEASE;  Surgeon: James Civil, MD;  Location: [facility];  Service: Orthopedics;  Laterality: Right;  . CORONARY ANGIOPLASTY WITH STENT PLACEMENT    . LEFT HEART CATH AND CORONARY ANGIOGRAPHY N/A 07/10/2017   Procedure: LEFT HEART CATH AND CORONARY ANGIOGRAPHY;  Surgeon: James Sine, MD;  Location: [facility];  Service: Cardiovascular;  Laterality: N/A;   Family History  Problem Relation Age of Onset  . CAD Father 70       CABG  . Heart disease Father   . CAD Brother   . Diabetes Mother   . Alcohol abuse Brother   . CAD Sister   . Heart failure Sister    Social History   Socioeconomic History  . Marital status: Single    Spouse name: Not on file  . Number of children: Not on file  . Years of education: 7  . Highest education level: High school graduate  Occupational History  . Occupation: disabled  Social Needs  . Financial resource strain: Not hard at all  . Food insecurity:    Worry: Never true    Inability: Never true  . Transportation needs:    Medical: No    Non-medical: No  Tobacco Use  . Smoking status: Former Smoker    Packs/day: 0.50    Years: 41.00    Pack years: 20.50    Types: Cigarettes    Last attempt to quit: 06/06/2018    Years since quitting: 0.2  . Smokeless tobacco: Never Used  . Tobacco comment: Patient quit on his own in February 2020  Substance and Sexual Activity  . Alcohol use: Yes    Comment: "once in a blue moon" typically drinks beer 2 weeks or longer  . Drug use: No  . Sexual activity: Never    Birth control/protection: None  Lifestyle  . Physical activity:    Days per week: 5 days    Minutes per session: 30 min  . Stress: Only a little  Relationships  . Social connections:    Talks on phone: More than three times a week    Gets together: More than three times a week    Attends religious service: More than 4 times per year    Active member of club or  organization: No    Attends meetings of clubs or organizations: Never    Relationship status: Never married  Other Topics Concern  . Not on file  Social History Narrative   08/05/2018   Patient lives alone in a trailer. His sister [name] will take take him to the grocery store or bring things by for him. He usually sees her twice a week. His "baby sister" also checks in on him. He is able to perform most ADLs but is illiterate. He does not drive and depends on family or arranged transportation. States that his landlord and  his wife are nice and lookout for him.      Clinical Intake:     Pain Score: 0-No pain                  Activities of Daily Living In your present state of health, do you have any difficulty performing the following activities: 08/22/2018 08/22/2018  Hearing? - N  Vision? - N  Difficulty concentrating or making decisions? (No Data) Y  Comment Patient states he has trouble concentrating -  Walking or climbing stairs? - N  Comment - -  Dressing or bathing? - N  Comment - -  Doing errands, shopping? - Y  Comment - Does not drive, family or [name] provides Copywriter, advertising and eating ? - N  Using the Toilet? - N  In the past six months, have you accidently leaked urine? - N  Do you have problems with loss of bowel control? - N  Managing your Medications? - N  Comment - -  Managing your Finances? - N  Comment - -  Housekeeping or managing your Housekeeping? - N  Comment - -  Some recent data might be hidden     Exercise Current Exercise Habits: Home exercise routine, Time (Minutes): 30, Frequency (Times/Week): 2, Weekly Exercise (Minutes/Week): 60, Intensity: Mild, Exercise limited by: cardiac condition(s);respiratory conditions(s);orthopedic condition(s);neurologic condition(s)  Diet Consumes 3 meals a day and 1 snacks a day.  The patient feels that he mostly follow a Regular diet.  Diet History Patient states he has access to all  the food he needs.  He reports eating mostly vegetables, whole grains, and some sweets for snacks.  He states he does not eat a significant amount of meat as he feels it is not good for him.  Advised eating chicken, [place], fish, eggs - baked, grilled, boiled or broiled in moderation would be healthy additions to his diet. He states he drinks at least 4 servings of Dr. Malachi Bonds per day, and plans to work on decreasing to 1 serving per day.    Depression Screen PHQ 2/9 Scores 08/22/2018 08/18/2018 08/05/2018 07/06/2018 04/11/2018 03/28/2018 02/22/2018  PHQ - 2 Score 0 0 0 0 2 0 0  PHQ- 9 Score - - - - 5 - -     Fall Risk Fall Risk  08/22/2018 08/18/2018 06/20/2018 05/04/2018 05/02/2018  Falls in the past year? 1 1 0 0 0  Number falls in past yr: 0 0 0 - -  Injury with Fall? 0 1 0 - -  Comment - Bilateral knee pain - - -  Risk for fall due to : - - Medication side effect - Medication side effect  Follow up - - Falls prevention discussed - Falls prevention discussed     Objective:    Today's Vitals   08/22/18 1003  BP: 123/71  Pulse: 84  Weight: 224 lb (101.6 kg)  Height: 6' (1.829 m)  PainSc: 0-No pain   Body mass index is 30.38 kg/m.  Advanced Directives 08/22/2018 06/27/2018 06/27/2018 03/08/2018 07/23/2017 07/19/2017 07/11/2017  Does Patient Have a Medical Advance Directive? No No No No No No No  Type of Advance Directive - - - - - - -  Would patient like information on creating a medical advance directive? Yes (MAU/Ambulatory/Procedural Areas - Information given) No - Patient declined - No - Patient declined Yes (MAU/Ambulatory/Procedural Areas - Information given) Yes (Inpatient - patient requests chaplain consult to create a medical advance directive) Yes (Inpatient -  patient requests chaplain consult to create a medical advance directive)    Hearing/Vision  No hearing or vision deficits noted during visit.  Cognitive Function: MMSE - Mini Mental State Exam 03/08/2018  Not completed: Unable to  complete     6CIT Screen 08/22/2018  What Year? 0 points  What month? 3 points  What time? 0 points  Count back from 20 4 points  Months in reverse 4 points  Repeat phrase 2 points  Total Score 13   Normal Cognitive Function Screening: No: did not attempt counting backwards and saying month of year in reverse.     Immunizations and Health Maintenance Immunization History  Administered Date(s) Administered  . Influenza,inj,Quad PF,6+ Mos 02/12/2014, 02/26/2015, 05/01/2016, 02/09/2017, 02/07/2018  . Pneumococcal Conjugate-13 02/07/2018  . Tdap 12/05/2011   There are no preventive care reminders to display for this patient. Health Maintenance  Topic Date Due  . INFLUENZA VACCINE  11/19/2018  . TETANUS/TDAP  12/04/2021  . COLONOSCOPY  04/18/2025  . Hepatitis C Screening  Completed  . HIV Screening  Completed        Assessment:   This is a routine wellness examination for [name].    Plan:    Goals    . "I need to figure out what is going on with my heart" (pt-stated)     Nurse Case Manager Clinical Goal(s): Over the next 6 months patient will demonstrate self-management of chest pain and will have no ED for angina   Interventions:   Discussed with patient. No recent issues with chest pain.   RNCM will continue to remain available as needed.  Advised to continue taking medications as prescribed  Advised to reach out to [facility] with any questions or concerns  Call 911 if he does have chest pain that is unrelieved with nitro or if he has to take 3 nitro tablets  Patient Self Care Activities:  . Patient monitors chest pain and reports  . Patient self administers medications . Patient calls the office with questions or concerns   *See Past Updates for previous documentation    . "I quit smoking around 06/06/2018 and I want to make sure that I don't start back" (pt-stated)     Current Barriers:  [name] Knowledge Deficits related to smoking cessation resources . Lacks  caregiver support.  Lauralyn Primes barriers  Nurse Case Manager Clinical Goal(s):  [name] Over the next 60 days patient continue smoking cessation  Interventions:  . Continued to provide encouragement and praise . Explained how the body changes at different intervals after smoking cessation . Discussed the short-term and long-term role that cigarettes and nicotine play in vasoconstriction and suggested that smoking cessation may play a role in lowering his blood pressure.   Patient Self Care Activities:  . Currently UNABLE TO independently perform all ADLs or drive . self administers medications as prescribed . Attends church or other social activities . Calls provider office for new concerns or questions  Plan:  . Patient will reach out to me or [name] if doesn't feel like he might smoke . RNCM will remain available as needed and will follow-up over the next 30 days   Please see past updates related to this goal by clicking on the "Past Updates" button in the selected goal    [name], RN-BC, BSN Nurse Case Manager [place] [phone]        . "I want to keep my medications straight" (pt-stated)  Current Barriers:  [name] Knowledge Deficits related to medication management . Lacks caregiver support.  . Film/video editor.  . Literacy barriers . Transportation barriers . Cognitive Deficits  Nurse Case Manager Clinical Goal(s):  [name] Over the next 30 days, patient will continue to work with [facility], PCP, and [facility] to manage his medications.  . Over the next 30 days, patient will reach out to [facility] directly with any questions or concerns regarding his medications.   Interventions:   RNCM met with [name] face to face in the office to review current medications  RNCM collaborated with covering provider and pharmacy regarding new orders (see goal regardig blood pressure management for details)  RNCM faxed order to discontinue all  prescriptions on file and to suspend mailorder (see letter in communications)  RNCM made created care coordination note advising to only use James for medication orders unless patient specifically requests a change  Patient Self Care Activities:  . Currently UNABLE TO independently drive. . Attends all scheduled provider appointments with arranged transportation.   Plan: RNCM to follow up by telephone over the next 7 days Patient will contact RNCM with any Questions or concerns  Please see past updates related to this goal by clicking on the "Past Updates" button in the selected goal       . "I want to lose some of this weight" (pt-stated)     Current Barriers:  . Literacy barriers  . Knowledge deficit related to proper weight management techniques  Nurse Case Manager Clinical Goal(s):  [name] Over the next 30 days, patient will verbalize understanding of plan for weight loss  . Over the next 30 days patient will state understanding of need for weight management and it's health benefits . Over the next 60 days patient will experience a weight loss of 4 to 8 pounds.    Interventions:   Discussed current diet  Plan to discuss further at future visit   Patient Self Care Activities:  . Currently UNABLE TO independently perform all ADLs independently . Self administers medications as prescribed (in pill pack) . Calls provider office for new concerns or questions  Plan:  . Patient will follow prescribed plan and f/u if needed . RNCM will follow up by telephone over the next 14 days.    Please see past updates related to this goal by clicking on the "Past Updates" button in the selected goal    [name], RN-BC, BSN  Nurse Case Manager [place] [phone]        . "I want to make sure my blood pressure stays under control" (pt-stated)     Current Barriers:  [name] Knowledge Deficits related to blood pressure readings and management .  Lacks caregiver support.   . Cognitive deficits . illiterate  Nurse Case Manager Clinical Goal(s):  [name] Over the next 7 days, Patient will continue to take medication as prescribed o Medication is prepackaged by [facility] and he will have a bottle of loose Metoprolol pills to last until the new pack starts on Monday . Over the next 7 days, patient will verbalize understanding of the need to check and record his blood pressure twice daily . Over the next 7 days, patient will verbalize understanding of blood pressure medication changes . Over the next 7 days, patient will report any side effects (lightheadnedness, dizziness, etc)  Interventions:  [name] Met with [name] face to face in the office . Reviewed his medications and looked  over the medicines that he brought in with him.  . Verified that he would like to continue using [facility] and that he does not want to use [facility]. . Unable to speak with a [facility] representative by phone due to extended wait times. I did fax an order to discontinue his prescriptions and to suspend mailorder (see letter in communications) . Blood pressure checked in office and was 129/77, P 100 . Questioned about recent symptoms. Denies any headaches or dizziness.   Questioned about home blood pressure readings. Patient reports readings. He has been checking and recording them twice a day but left the log at home.   Ordered Metoprolol 72m 1/2 tablet twice daily per Dr Dettinger  [facility]  Today they will deliver a loose bottle of split pills to last until he starts his new pack on Monday  Starting Monday the pill pack will contain 1/2 tablet of metoprolol 23mtwice daily Patient Self Care Activities:  . Currently UNABLE TO independently completely bathe, drive, perform housekeeping duties*  . He is able to take his prepackaged medications on his own . Prepares his own meals  Please see  past updates related to this goal by clicking on the "Past Updates" button in the selected goal       . "I've had a heart attack before and I don't want to have another one." (pt-stated)     Current Barriers:  . Cognitive deficits . Illiteracy . Dependence on others for transportation  Nurse Case Manager Clinical Goal(s): Over the next 2 months patient will demonstrate continued desire to improve his health as evidenced by following a heart healthy diet, continuing to be smoke free, and increasing his physical activity.  Interventions:   Reviewed medications with patient  Congratulated patient on 2 weeks of being smoke free and encouraged him to continue to abstain from cigarettes  Reminded him to follow up with Dr HoPercival Spanishn 1 year and PRN  Reviewed patient's current dietary habits  Education on heart healthy diet and portion sizes discussed  Increase physical activity as tolerated with a goal of 150 minutes of moderate activity a week   Patient Self Care Activities:  . Self administers medications . Has the ability to exercise . He does some physical labor but not consistently . Aware of s/s of heart attack  Please see past updates related to this goal by clicking on the "Past Updates" button in the selected goal       . : " I need to get through all this legal stuff " (pt-stated)     Clinical Goal(s):  Over the next 30 days, patient will affirm attendance to scheduled court hearing/mediation session and verbalize understanding of any ongoing legal obligations.   Interventions:  Confirmed that patient remains in contact with his attorney [name] again reminded client that he could schedule [facility] as needed for attendance at required court hearings or mediation sessions [name] collaborated with [place] of client [name] encouraged client to talk with RN CM regarding medications questions of client  Patient  Self Care Activities:   Attends scheduled medical appointments  Takes medications as prescribed  Communicates with attorney to discuss client's legal issues  Communicates with RNCM to discuss nursing needs of client  Plan:  [name] to call client in 3 weeks to discuss with client legal issues client is facing and to encourage client to  work with attorney of client regarding client's legal issues.   Client to call RN CM as needed to discuss nursing needs of client     Client to attend scheduled medical appointments    Client to attend next  scheduled client mediation session in [place], [place]  Client to communicate, as needed, with RN CM regarding medication questions of client      . DIET - DECREASE SODA OR JUICE INTAKE     Reduce soda intake to 1 serving per day.     . Exercise 150 min/wk Moderate Activity    . Have 3 meals a day        Health Maintenance & Additional Screening Recommendations: Advanced directives: has NO advanced directive  - add't info requested. Referral to SW: no  Lung: Low Dose CT Chest recommended if Age 104-80 years, 30 pack-year currently smoking OR have quit w/in 15years. Patient does qualify. Hepatitis C Screening recommended: completed 02/26/2015 HIV screening recommended: completed 07/11/2017  Today's Orders N/A  Keep f/u with James Fraise, MD and any other specialty appointments you may have Continue current medications Move carefully to avoid falls. Use assistive devices like a cane or walker if needed. Aim for at least 150 minutes of moderate activity a week. This can be done with chair exercises if necessary. Read or work on puzzles daily Stay connected with friends and family  I have personally reviewed and noted the following in the patient's chart:   . Medical and social history . Use of alcohol, tobacco or illicit drugs  . Current medications and supplements . Functional ability and status . Nutritional status . Physical  activity . Advanced directives . List of other physicians . Hospitalizations, surgeries, and ER visits in previous 12 months . Vitals . Screenings to include cognitive, depression, and falls . Referrals and appointments  In addition, I have reviewed and discussed with patient certain preventive protocols, quality metrics, and best practice recommendations. A written personalized care plan for preventive services as well as general preventive health recommendations were provided to patient.     [name], RN  08/22/2018

## 2018-08-22 NOTE — Patient Instructions (Signed)
Please work on your goal of cutting back on your soda to 1 serving per day, and drinking more water.  Please keep your follow up appointment with Dr. Livia Hale.   Please continue being careful to avoid falls.  Please continue exercising and making healthy food choices.   Please review the information given on Advance Directives.  If you complete the paperwork, please bring a copy to our office to be filed in your medical record.   Please consider getting the Shingrix (Shingles) vaccine in the future.  Thank you for coming in for your Annual Wellness Visit today!    Preventive Care 40-64 Years, Male Preventive care refers to lifestyle choices and visits with your health care provider that can promote health and wellness. What does preventive care include?   A yearly physical exam. This is also called an annual well check.  Dental exams once or twice a year.  Routine eye exams. Ask your health care provider how often you should have your eyes checked.  Personal lifestyle choices, including: ? Daily care of your teeth and gums. ? Regular physical activity. ? Eating a healthy diet. ? Avoiding tobacco and drug use. ? Limiting alcohol use. ? Practicing safe sex. ? Taking low-dose aspirin every day starting at age 73. What happens during an annual well check? The services and screenings done by your health care provider during your annual well check will depend on your age, overall health, lifestyle risk factors, and family history of disease. Counseling Your health care provider may ask you questions about your:  Alcohol use.  Tobacco use.  Drug use.  Emotional well-being.  Home and relationship well-being.  Sexual activity.  Eating habits.  Work and work Statistician. Screening You may have the following tests or measurements:  Height, weight, and BMI.  Blood pressure.  Lipid and cholesterol levels. These may be checked every 5 years, or more frequently if you are  over 52 years old.  Skin check.  Lung cancer screening. You may have this screening every year starting at age 97 if you have a 30-pack-year history of smoking and currently smoke or have quit within the past 15 years.  Colorectal cancer screening. All adults should have this screening starting at age 3 and continuing until age 79. Your health care provider may recommend screening at age 8. You will have tests every 1-10 years, depending on your results and the type of screening test. People at increased risk should start screening at an earlier age. Screening tests may include: ? Guaiac-based fecal occult blood testing. ? Fecal immunochemical test (FIT). ? Stool DNA test. ? Virtual colonoscopy. ? Sigmoidoscopy. During this test, a flexible tube with a tiny camera (sigmoidoscope) is used to examine your rectum and lower colon. The sigmoidoscope is inserted through your anus into your rectum and lower colon. ? Colonoscopy. During this test, a long, thin, flexible tube with a tiny camera (colonoscope) is used to examine your entire colon and rectum.  Prostate cancer screening. Recommendations will vary depending on your family history and other risks.  Hepatitis C blood test.  Hepatitis B blood test.  Sexually transmitted disease (STD) testing.  Diabetes screening. This is done by checking your blood sugar (glucose) after you have not eaten for a while (fasting). You may have this done every 1-3 years. Discuss your test results, treatment options, and if necessary, the need for more tests with your health care provider. Vaccines Your health care provider may recommend certain vaccines, such as:  Influenza vaccine. This is recommended every year.  Tetanus, diphtheria, and acellular pertussis (Tdap, Td) vaccine. You may need a Td booster every 10 years.  Varicella vaccine. You may need this if you have not been vaccinated.  Zoster vaccine. You may need this after age 68.  Measles,  mumps, and rubella (MMR) vaccine. You may need at least one dose of MMR if you were born in 1957 or later. You may also need a second dose.  Pneumococcal 13-valent conjugate (PCV13) vaccine. You may need this if you have certain conditions and have not been vaccinated.  Pneumococcal polysaccharide (PPSV23) vaccine. You may need one or two doses if you smoke cigarettes or if you have certain conditions.  Meningococcal vaccine. You may need this if you have certain conditions.  Hepatitis A vaccine. You may need this if you have certain conditions or if you travel or work in places where you may be exposed to hepatitis A.  Hepatitis B vaccine. You may need this if you have certain conditions or if you travel or work in places where you may be exposed to hepatitis B.  Haemophilus influenzae type b (Hib) vaccine. You may need this if you have certain risk factors. Talk to your health care provider about which screenings and vaccines you need and how often you need them. This information is not intended to replace advice given to you by your health care provider. Make sure you discuss any questions you have with your health care provider. Document Released: 05/03/2015 Document Revised: 05/27/2017 Document Reviewed: 02/05/2015 Elsevier Interactive Patient Education  2019 [employer].

## 2018-08-22 NOTE — Patient Instructions (Signed)
Licensed Clinical Armed forces training and education officer Provided: No   Client called [name] to talk with [name] about client's appointment schedule. Client had appointment with [name]  RN today at [facility]. Client said he was taking medications as prescribed . He said his knees were feeling better now from his recent fall. He said he spends time with his pet dog to help him relax. He said he has adequate food supply. He is familiar with [name] services and is able to call [name] for transport assistance as needed. He has some support from his sister.   [name] encouraged client to call RNCM [name] as needed to discuss nursing needs of client.   Follow Up Plan: [name] to call client as scheduled to assess psychosocial needs of client.  The patient verbalized understanding of instructions provided today and declined a print copy of patient instruction materials.    [name].Donnavin Vandenbrink MSW, [name] Licensed Clinical Social Worker [facility] [phone]

## 2018-08-24 ENCOUNTER — Other Ambulatory Visit: Payer: Self-pay | Admitting: Family Medicine

## 2018-08-25 ENCOUNTER — Other Ambulatory Visit: Payer: Self-pay

## 2018-08-25 ENCOUNTER — Ambulatory Visit (INDEPENDENT_AMBULATORY_CARE_PROVIDER_SITE_OTHER): Payer: Medicare Other | Admitting: Family Medicine

## 2018-08-25 ENCOUNTER — Encounter: Payer: Self-pay | Admitting: Family Medicine

## 2018-08-25 DIAGNOSIS — H66001 Acute suppurative otitis media without spontaneous rupture of ear drum, right ear: Secondary | ICD-10-CM | POA: Diagnosis not present

## 2018-08-25 DIAGNOSIS — J029 Acute pharyngitis, unspecified: Secondary | ICD-10-CM

## 2018-08-25 MED ORDER — AMOXICILLIN-POT CLAVULANATE 875-125 MG PO TABS: 1.0000 | ORAL_TABLET | Freq: Two times a day (BID) | ORAL | 0 refills | Status: AC

## 2018-08-26 DIAGNOSIS — Z9181 History of falling: Secondary | ICD-10-CM | POA: Diagnosis not present

## 2018-08-26 DIAGNOSIS — I119 Hypertensive heart disease without heart failure: Secondary | ICD-10-CM | POA: Diagnosis not present

## 2018-08-26 DIAGNOSIS — Z7982 Long term (current) use of aspirin: Secondary | ICD-10-CM | POA: Diagnosis not present

## 2018-08-26 DIAGNOSIS — Z7951 Long term (current) use of inhaled steroids: Secondary | ICD-10-CM | POA: Diagnosis not present

## 2018-08-26 DIAGNOSIS — J449 Chronic obstructive pulmonary disease, unspecified: Secondary | ICD-10-CM | POA: Diagnosis not present

## 2018-08-26 DIAGNOSIS — I2510 Atherosclerotic heart disease of native coronary artery without angina pectoris: Secondary | ICD-10-CM | POA: Diagnosis not present

## 2018-08-26 DIAGNOSIS — J209 Acute bronchitis, unspecified: Secondary | ICD-10-CM | POA: Diagnosis not present

## 2018-08-26 DIAGNOSIS — Z720 Tobacco use: Secondary | ICD-10-CM | POA: Diagnosis not present

## 2018-08-26 NOTE — Patient Instructions (Signed)
Sore Throat  When you have a sore throat, your throat may feel:  · Tender.  · Burning.  · Irritated.  · Scratchy.  · Painful when you swallow.  · Painful when you talk.  Many things can cause a sore throat, such as:  · An infection.  · Allergies.  · Dry air.  · Smoke or pollution.  · Radiation treatment.  · Gastroesophageal reflux disease (GERD).  · A tumor.  A sore throat can be the first sign of another sickness. It can happen with other problems, like:  · Coughing.  · Sneezing.  · Fever.  · Swelling in the neck.  Most sore throats go away without treatment.  Follow these instructions at home:         · Take over-the-counter medicines only as told by your doctor.  ? If your child has a sore throat, do not give your child aspirin.  · Drink enough fluids to keep your pee (urine) pale yellow.  · Rest when you feel you need to.  · To help with pain:  ? Sip warm liquids, such as broth, herbal tea, or warm water.  ? Eat or drink cold or frozen liquids, such as frozen ice pops.  ? Gargle with a salt-water mixture 3-4 times a day or as needed. To make a salt-water mixture, add ½-1 tsp (3-6 g) of salt to 1 cup (237 mL) of warm water. Mix it until you cannot see the salt anymore.  ? Suck on hard candy or throat lozenges.  ? Put a cool-mist humidifier in your bedroom at night.  ? Sit in the bathroom with the door closed for 5-10 minutes while you run hot water in the shower.  · Do not use any products that contain nicotine or tobacco, such as cigarettes, e-cigarettes, and chewing tobacco. If you need help quitting, ask your doctor.  · Wash your hands well and often with soap and water. If soap and water are not available, use hand sanitizer.  Contact a doctor if:  · You have a fever for more than 2-3 days.  · You keep having symptoms for more than 2-3 days.  · Your throat does not get better in 7 days.  · You have a fever and your symptoms suddenly get worse.  · Your child who is 3 months to 3 years old has a temperature of  102.2°F (39°C) or higher.  Get help right away if:  · You have trouble breathing.  · You cannot swallow fluids, soft foods, or your saliva.  · You have swelling in your throat or neck that gets worse.  · You keep feeling sick to your stomach (nauseous).  · You keep throwing up (vomiting).  Summary  · A sore throat is pain, burning, irritation, or scratchiness in the throat. Many things can cause a sore throat.  · Take over-the-counter medicines only as told by your doctor. Do not give your child aspirin.  · Drink plenty of fluids, and rest as needed.  · Contact a doctor if your symptoms get worse or your sore throat does not get better within 7 days.  This information is not intended to replace advice given to you by your health care provider. Make sure you discuss any questions you have with your health care provider.  Document Released: 01/14/2008 Document Revised: 09/06/2017 Document Reviewed: 09/06/2017  Elsevier Interactive Patient Education © 2019 Elsevier Inc.

## 2018-08-26 NOTE — Progress Notes (Signed)
Virtual Visit via telephone Note Due to COVID-19, visit is conducted virtually and was requested by patient. This visit type was conducted due to national recommendations for restrictions regarding the COVID-19 Pandemic (e.g. social distancing) in an effort to limit this patient's exposure and mitigate transmission in our community. All issues noted in this document were discussed and addressed.  A physical exam was not performed with this format.   I connected with [name] on 08/26/18 at 1535 by telephone and verified that I am speaking with the correct person using two identifiers. [name] is currently located at home and no one is currently with them during visit. The provider, Monia Pouch, FNP is located in their office at time of visit.  I discussed the limitations, risks, security and privacy concerns of performing an evaluation and management service by telephone and the availability of in person appointments. I also discussed with the patient that there may be a patient responsible charge related to this service. The patient expressed understanding and agreed to proceed.  Subjective:  Patient ID: [name], male    DOB: 01-16-1956, 63 y.o.   MRN: [id number]  Chief Complaint:  Sore Throat   HPI: [name] is a 63 y.o. male presenting on 08/25/2018 for Sore Throat   Pt reports sore throat and right ear pain. Pt states the sore throat started around 3 days ago and is getting worse. States the pain is sharp and burning, 6/10, and worse with eating and drinking. He states he has had a fever and chills with the pain. He also reports right ear throbbing and swollen lymph nodes on the right side of his neck. He states this started last night and is getting worse. No drainage from the ear. No trouble hearing.   Sore Throat   This is a new problem. The current episode started in the past 7 days. The problem has been gradually worsening. The pain is worse on the right  side. The maximum temperature recorded prior to his arrival was 101 - 101.9 F. The fever has been present for 1 to 2 days. The pain is at a severity of 6/10. The pain is moderate. Associated symptoms include coughing, ear pain, headaches, swollen glands and trouble swallowing. Pertinent negatives include no abdominal pain, congestion, diarrhea, drooling, ear discharge, hoarse voice, plugged ear sensation, neck pain, shortness of breath, stridor or vomiting. He has tried nothing for the symptoms.     Relevant past medical, surgical, family, and social history reviewed and updated as indicated.  Allergies and medications reviewed and updated.   Past Medical History:  Diagnosis Date  . Cerebral palsy ([place])   . Coronary artery disease    LAD 70% stenosis, cath 2019  . Depression   . GERD (gastroesophageal reflux disease)   . Hypertension   . Scoliosis   . Seizures ([place])    pt's sister states that patinet has had seizures in the past, pt camr for PAT by himself on last visit and she stst "they misunderstood what he was trying to say. Pt saw neurologist in March who did CT and states patient does not have seizures. On no meds, had seizures as child.    Past Surgical History:  Procedure Laterality Date  . CARPAL TUNNEL RELEASE Right 08/13/2016   Procedure: CARPAL TUNNEL RELEASE;  Surgeon: Carole Civil, MD;  Location: [facility];  Service: Orthopedics;  Laterality: Right;  . CORONARY ANGIOPLASTY WITH STENT PLACEMENT    .  LEFT HEART CATH AND CORONARY ANGIOGRAPHY N/A 07/10/2017   Procedure: LEFT HEART CATH AND CORONARY ANGIOGRAPHY;  Surgeon: Troy Sine, MD;  Location: [facility];  Service: Cardiovascular;  Laterality: N/A;    Social History   Socioeconomic History  . Marital status: Single    Spouse name: Not on file  . Number of children: Not on file  . Years of education: 46  . Highest education level: High school graduate  Occupational History  . Occupation: disabled   Social Needs  . Financial resource strain: Not hard at all  . Food insecurity:    Worry: Never true    Inability: Never true  . Transportation needs:    Medical: No    Non-medical: No  Tobacco Use  . Smoking status: Former Smoker    Packs/day: 0.50    Years: 41.00    Pack years: 20.50    Types: Cigarettes    Last attempt to quit: 06/06/2018    Years since quitting: 0.2  . Smokeless tobacco: Never Used  . Tobacco comment: Patient quit on his own in February 2020  Substance and Sexual Activity  . Alcohol use: Yes    Comment: "once in a blue moon" typically drinks beer 2 weeks or longer  . Drug use: No  . Sexual activity: Never    Birth control/protection: None  Lifestyle  . Physical activity:    Days per week: 5 days    Minutes per session: 30 min  . Stress: Only a little  Relationships  . Social connections:    Talks on phone: More than three times a week    Gets together: More than three times a week    Attends religious service: More than 4 times per year    Active member of club or organization: No    Attends meetings of clubs or organizations: Never    Relationship status: Never married  . Intimate partner violence:    Fear of current or ex partner: No    Emotionally abused: No    Physically abused: No    Forced sexual activity: No  Other Topics Concern  . Not on file  Social History Narrative   08/05/2018   Patient lives alone in a trailer. His sister [name] will take take him to the grocery store or bring things by for him. He usually sees her twice a week. His "baby sister" also checks in on him. He is able to perform most ADLs but is illiterate. He does not drive and depends on family or arranged transportation. States that his landlord and his wife are nice and lookout for him.     Outpatient Encounter Medications as of 08/25/2018  Medication Sig  . acetaminophen (TYLENOL) 325 MG tablet Take 2 tablets (650 mg total) by mouth every 6 (six) hours as needed for  mild pain or headache.  . albuterol (PROAIR HFA) 108 (90 Base) MCG/ACT inhaler 1-2 puffs every 6 hours as needed wheezing or shortness of breath.  [name] amoxicillin-clavulanate (AUGMENTIN) 875-125 MG tablet Take 1 tablet by mouth 2 (two) times daily for 10 days.  [name] aspirin EC 81 MG tablet Take 1 tablet (81 mg total) by mouth daily.  [name] atorvastatin (LIPITOR) 40 MG tablet Take 1 tablet (40 mg total) by mouth daily at 6 PM.  . buPROPion (WELLBUTRIN SR) 150 MG 12 hr tablet Take 1 tablet (150 mg total) by mouth 2 (two) times daily.  . COMBIVENT RESPIMAT 20-100 MCG/ACT AERS respimat  Inhale 1 puff into the lungs every 6 (six) hours  . DULoxetine (CYMBALTA) 30 MG capsule Take 2 capsules (60 mg total) by mouth daily. WITH A FULL STOMACH AT SUPPER TIME  . fexofenadine (ALLEGRA) 180 MG tablet Take 1 tablet (180 mg total) by mouth daily. For allergy symptoms  . fluticasone (FLONASE) 50 MCG/ACT nasal spray Place 2 sprays into both nostrils daily as needed for allergies or rhinitis.  . fluticasone furoate-vilanterol (BREO ELLIPTA) 100-25 MCG/INH AEPB Inhale 1 puff into the lungs daily.  . isosorbide mononitrate (IMDUR) 30 MG 24 hr tablet Take 1 tablet (30 mg total) by mouth daily.  . metoprolol tartrate (LOPRESSOR) 25 MG tablet Take 1/2 tablet by mouth two times a day and include in pill packs  . nitroGLYCERIN (NITROSTAT) 0.4 MG SL tablet Place 1 tablet (0.4 mg total) under the tongue every 5 (five) minutes x 3 doses as needed for chest pain.  . pantoprazole (PROTONIX) 40 MG tablet Take 1 tablet (40 mg total) by mouth 2 (two) times daily.  . predniSONE (DELTASONE) 20 MG tablet Take 1 tablet (20 mg total) by mouth 2 (two) times daily with a meal. For 2weeks  . tamsulosin (FLOMAX) 0.4 MG CAPS capsule TAKE (1) CAPSULE DAILY   No facility-administered encounter medications on file as of 08/25/2018.     Allergies  Allergen Reactions  . Chantix [Varenicline Tartrate] Nausea And Vomiting    Review of Systems   Constitutional: Positive for chills, fatigue and fever. Negative for activity change and appetite change.  HENT: Positive for ear pain, sore throat and trouble swallowing. Negative for congestion, dental problem, drooling, ear discharge, hearing loss, hoarse voice, postnasal drip, rhinorrhea, sinus pressure, sinus pain, sneezing and voice change.   Respiratory: Positive for cough. Negative for choking, chest tightness, shortness of breath, wheezing and stridor.   Cardiovascular: Negative for palpitations and leg swelling.  Gastrointestinal: Negative for abdominal pain, diarrhea, nausea and vomiting.  Musculoskeletal: Positive for myalgias. Negative for neck pain and neck stiffness.  Neurological: Positive for headaches. Negative for dizziness, weakness and light-headedness.  Psychiatric/Behavioral: Negative for confusion.  All other systems reviewed and are negative.        Observations/Objective: No vital signs or physical exam, this was a telephone or virtual health encounter.  Pt alert and oriented, answers all questions appropriately, and able to speak in full sentences.    Assessment and Plan: Dvante was seen today for sore throat.  Diagnoses and all orders for this visit:  Pharyngitis, unspecified etiology Reported symptoms consistent with bacterial pharyngitis. Symptomatic care discussed. Medications as prescribed. Report any new or worsening symptoms.  -     amoxicillin-clavulanate (AUGMENTIN) 875-125 MG tablet; Take 1 tablet by mouth 2 (two) times daily for 10 days.  Non-recurrent acute suppurative otitis Hale of right ear without spontaneous rupture of tympanic membrane Reported symptoms consistent with acute otitis. Will treat with below. Symptomatic care discussed. Report any new or worsening symptoms.  -     amoxicillin-clavulanate (AUGMENTIN) 875-125 MG tablet; Take 1 tablet by mouth 2 (two) times daily for 10 days.     Follow Up Instructions: Return if symptoms  worsen or fail to improve.    I discussed the assessment and treatment plan with the patient. The patient was provided an opportunity to ask questions and all were answered. The patient agreed with the plan and demonstrated an understanding of the instructions.   The patient was advised to call back or seek an in-person evaluation if  the symptoms worsen or if the condition fails to improve as anticipated.  The above assessment and management plan was discussed with the patient. The patient verbalized understanding of and has agreed to the management plan. Patient is aware to call the clinic if symptoms persist or worsen. Patient is aware when to return to the clinic for a follow-up visit. Patient educated on when it is appropriate to go to the emergency department.    I provided 15 minutes of non-face-to-face time during this encounter. The call started at 1535. The call ended at 1550. The other time was used for coordination of care.    Monia Pouch, FNP-C [facility] [address] [place], [place] [address] [phone]

## 2018-08-29 ENCOUNTER — Ambulatory Visit: Payer: Self-pay | Admitting: Licensed Clinical Social Worker

## 2018-08-29 DIAGNOSIS — R482 Apraxia: Secondary | ICD-10-CM

## 2018-08-29 DIAGNOSIS — Z550 Illiteracy and low-level literacy: Secondary | ICD-10-CM

## 2018-08-29 DIAGNOSIS — I209 Angina pectoris, unspecified: Secondary | ICD-10-CM

## 2018-08-29 DIAGNOSIS — F411 Generalized anxiety disorder: Secondary | ICD-10-CM

## 2018-08-29 DIAGNOSIS — I252 Old myocardial infarction: Secondary | ICD-10-CM

## 2018-08-29 DIAGNOSIS — G809 Cerebral palsy, unspecified: Secondary | ICD-10-CM

## 2018-08-29 DIAGNOSIS — I25118 Atherosclerotic heart disease of native coronary artery with other forms of angina pectoris: Secondary | ICD-10-CM

## 2018-08-29 NOTE — Chronic Care Management (AMB) (Signed)
  Care Management Note   [name] is a 63 y.o. year old male who is a primary care patient of [name], [name], MD. The CM team was consulted for assistance with chronic disease management and care coordination.   I reached out to [name] by phone today.   Review of patient status, including review of consultants reports, relevant laboratory and other test results, and collaboration with appropriate care team members and the patient's provider was performed as part of comprehensive patient evaluation and provision of chronic care management services.   Social Determinants of Health:Risk for Social isolation; risk for tobacco exposure    Chronic Care Management from 04/11/2018 in [place]  PHQ-9 Total Score  5     [name] contacted client today via phone.  Client reported that he was sleeping adequately and eating adequately. He said he had spoken to his 3 sisters yesterday and he was glad to have spoken to all of his sisters. He said he has one sister who helps him go to grocery store to obtain needed food items. He said his knees were starting to feel better. He and [name] spoke of his in home support with [facility] agency.  He said he has prescribed medications and is taking medications as prescribed. Client and [name] spoke of social support network of client. Client enjoys spending time also caring for his pet dog. [name] also talked with client about current financial needs of client  Follow Up Plan: [name] to call client as scheduled in next 2 weeks to assess client needs at that time  [name].[name] MSW, [name] Licensed Clinical Social Worker [facility] [phone]

## 2018-08-29 NOTE — Patient Instructions (Addendum)
Licensed Clinical Social Worker Visit Information  Materials Provided: No    [name] contacted client today via phone.  Client reported that he was sleeping adequately and eating adequately. He said he had spoken to his 3 sisters yesterday and he was glad to have spoken to all of his sisters. He said he has one sister who helps him go to grocery store to obtain needed food items. He said his knees were starting to feel better. He and [name] spoke of his in home support with [facility] agency.  He said he has prescribed medications and is taking medications as prescribed. Client and [name] spoke of social support network of client. Client enjoys spending time also caring for his pet dog. [name] also talked with client about current financial needs of client  Follow Up Plan: [name] to call client as scheduled in next 2 weeks to assess client needs at that time   The patient verbalized understanding of instructions provided today and declined a print copy of patient instruction materials.   [name].Delos Klich MSW, [name] Licensed Clinical Social Worker [facility] [phone]

## 2018-08-30 ENCOUNTER — Other Ambulatory Visit: Payer: Self-pay

## 2018-08-30 ENCOUNTER — Ambulatory Visit: Payer: Medicare Other | Admitting: Licensed Clinical Social Worker

## 2018-08-30 DIAGNOSIS — R482 Apraxia: Secondary | ICD-10-CM

## 2018-08-30 DIAGNOSIS — F411 Generalized anxiety disorder: Secondary | ICD-10-CM

## 2018-08-30 DIAGNOSIS — I25118 Atherosclerotic heart disease of native coronary artery with other forms of angina pectoris: Secondary | ICD-10-CM

## 2018-08-30 DIAGNOSIS — I209 Angina pectoris, unspecified: Secondary | ICD-10-CM

## 2018-08-30 DIAGNOSIS — Z550 Illiteracy and low-level literacy: Secondary | ICD-10-CM

## 2018-08-30 DIAGNOSIS — I252 Old myocardial infarction: Secondary | ICD-10-CM

## 2018-08-30 DIAGNOSIS — G809 Cerebral palsy, unspecified: Secondary | ICD-10-CM

## 2018-08-30 NOTE — Chronic Care Management (AMB) (Signed)
  Care Management Note   [name] is a 63 y.o. year old male who is a primary care patient of [name], [name], MD. The CM team was consulted for assistance with chronic disease management and care coordination.   I reached out to [place] by phone today.   Review of patient status, including review of consultants reports, relevant laboratory and other test results, and collaboration with appropriate care team members and the patient's provider was performed as part of comprehensive patient evaluation and provision of chronic care management services.   Social Determinants of Health:risk for tobacco exposure; risk for social isolation     Chronic Care Management from 04/11/2018 in [place]  PHQ-9 Total Score  5     [name] spoke via phone with client to discuss client needs. Client said he has his prescribed medications and is taking medications as prescribed.  Client said that his knees are feeling better at present.  [name] talked with client about client support from home health aide with [facility].  Client said he has adequate food supply. [name] talked with client about client communication with [facility] regarding client's medications. Client said he is sleeping adequately.  Client said he has adequate supply of heating oil.  Client said he receives his prescribed medications delivered each Saturday through [facility].  Client said he had appointment recently with [name] RN at [facility] for annual wellness visit. [name] encouraged client to call RNCM as needed to discuss nursing needs of client.   Follow Up Plan: [name] to call client in the next 3 weeks to discuss psychosocial needs of client.  [name].Timur Nibert MSW, [name] Licensed Clinical Social Worker [facility] Care Management [phone]

## 2018-08-30 NOTE — Patient Instructions (Addendum)
Licensed Clinical Social Worker Visit Information  Materials Provided: No   [name] spoke via phone with client to discuss client needs. Client said he has his prescribed medications and is taking medications as prescribed.  Client said that his knees are feeling better at present.  [name] talked with client about client support from home health aide with [facility].  Client said he has adequate food supply. [name] talked with client about client communication with [facility] regarding client's medications. Client said he is sleeping adequately.  Client said he has adequate supply of heating oil.  Client said he receives his prescribed medications delivered each Saturday through [facility].  Client said he had appointment recently with [name] RN at [facility] for annual wellness visit. [name] encouraged client to call RNCM as needed to discuss nursing needs of client.    Follow Up Plan: [name] to call client in next 3 weeks to talk with client about psychosocial needs of client  The patient verbalized understanding of instructions provided today and declined a print copy of patient instruction materials.   [name].Maximiliano Cromartie MSW, [name] Licensed Clinical Social Worker [facility] Care Management [phone]

## 2018-08-31 ENCOUNTER — Telehealth: Payer: Medicare Other

## 2018-09-01 ENCOUNTER — Ambulatory Visit: Payer: Medicare Other | Admitting: *Deleted

## 2018-09-01 DIAGNOSIS — I10 Essential (primary) hypertension: Secondary | ICD-10-CM

## 2018-09-01 DIAGNOSIS — G809 Cerebral palsy, unspecified: Secondary | ICD-10-CM

## 2018-09-01 NOTE — Chronic Care Management (AMB) (Signed)
  Chronic Care Management   Telephone Follow Up Note   09/01/2018 Name: [name] MRN: [id number] DOB: [birth year]  Referred by: [name], MD Reason for referral : Chronic Care Management (RN f/u on medications)   [name] is a 63 y.o. year old male who is a primary care patient of [name], [name], MD. The CCM team was consulted for assistance with chronic disease management and care coordination needs.    I placed a follow-up telephone call to [name] today but I was unable to speak with him.   Follow Up Plan The CM team will reach out to the patient again over the next 2 days.    [name], RN-BC, BSN Nurse Care Manager [facility] [phone]

## 2018-09-02 ENCOUNTER — Ambulatory Visit (INDEPENDENT_AMBULATORY_CARE_PROVIDER_SITE_OTHER): Payer: Medicare Other | Admitting: *Deleted

## 2018-09-02 DIAGNOSIS — I10 Essential (primary) hypertension: Secondary | ICD-10-CM | POA: Diagnosis not present

## 2018-09-02 DIAGNOSIS — I252 Old myocardial infarction: Secondary | ICD-10-CM | POA: Diagnosis not present

## 2018-09-02 DIAGNOSIS — G809 Cerebral palsy, unspecified: Secondary | ICD-10-CM

## 2018-09-02 NOTE — Chronic Care Management (AMB) (Signed)
Chronic Care Management   Follow Up Note   09/02/2018 Name: BEACHER EVERY MRN: [id number] DOB: 1956/04/09  Referred by: [name], MD Reason for referral : Chronic Care Management (RN follow up on medications)   KAIKOA MAGRO is a 63 y.o. year old male who is a primary care patient of [name], [name], MD. The CCM team was consulted for assistance with chronic disease management and care coordination needs.    Review of patient status, including review of consultants reports, relevant laboratory and other test results, and collaboration with appropriate care team members and the patient's provider was performed as part of comprehensive patient evaluation and provision of chronic care management services.    I spoke with [name] by telephone today.   Goals Addressed      Patient Stated   . "I need to figure out what is going on with my heart" (pt-stated)       Nurse Case Manager Clinical Goal(s):   Over the next 6 months patient will demonstrate self-management of chest pain and will have no ED for angina   Interventions:   RNCM will reamain available for any new needs related to chest pain   Patient Self Care Activities:  . Patient monitors chest pain and reports  . Patient self administers medications . Patient calls the office with questions or concerns     . "I quit smoking around 06/06/2018 and I want to make sure that I don't start back" (pt-stated)       Current Barriers:  [name] Knowledge Deficits related to smoking cessation resources . Lacks caregiver support.  Lauralyn Primes barriers  Nurse Case Manager Clinical Goal(s):  [name] Over the next 90 days patient continue smoking cessation  Interventions:  . Continued to provide encouragement and praise . Explained how the body changes at different intervals after smoking cessation . Discussed the short-term and long-term role that cigarettes and nicotine play in vasoconstriction and suggested that smoking cessation may play  a role in lowering his blood pressure.   Patient Self Care Activities:  . Currently UNABLE TO independently perform all ADLs or drive . self administers medications as prescribed . Attends church or other social activities . Calls provider office for new concerns or questions     . "I want to keep my medications straight" (pt-stated)       Current Barriers:  [name] Knowledge Deficits related to medication management . Lacks caregiver support.  . Film/video editor.  . Literacy barriers . Transportation barriers . Cognitive Deficits  Nurse Case Manager Clinical Goal(s):  [name] Over the next 90 days, patient will continue to receive prepackaged weekly medications from [facility] . Over the next 90 days, patient will call RNCM at [phone] with any medication related concerns  Interventions:   Reviewed current medication  List  Verified that no changes have been made since our last visit  Advised patient to contact me at [phone] if he has any questions or concerns or if any changes are made to his medications  Patient Self Care Activities:  . Currently UNABLE TO independently drive. . Attends all scheduled provider appointments with arranged transportation.       [name] "I want to lose some of this weight" (pt-stated)       Current Barriers:  . Literacy barriers  . Knowledge deficit related to proper weight management techniques  Nurse Case Manager Clinical Goal(s):  [name] Over the next 30 days, patient will verbalize understanding of plan for weight  loss  . Over the next 30 days patient will state understanding of need for weight management and it's health benefits . Over the next 60 days patient will experience a weight loss of 4 to 8 pounds.    Interventions:   Discussed current diet  Recommended portion control  Recommended lean protein, complex carbs (fruits, vegetables, whole grains, nuts)  Decrease simple carbs like white sugar, white bread, candy, soda, etc   Patient Self Care Activities:  . Currently UNABLE TO independently perform all ADLs independently . Self administers medications as prescribed (in pill pack) . Calls provider office for new concerns or questions     . "I want to make sure my blood pressure stays under control" (pt-stated)       Current Barriers:  [name] Knowledge Deficits related to blood pressure readings and management . Lacks caregiver support.   . Cognitive deficits . illiterate  Nurse Case Manager Clinical Goal(s):   Over the next 30 days, patient will continue to take medications as prescribed  Over the next 30 days, patient will continue to receive prepackaged weekly medications from [facility]  Interventions:   Reviewed chart and previous notes  Discussed home blood pressure readings with patient. He did not have those readily available during call but reports that they have been normal  Discussed hypotensive symptoms with patient and he denies having any since most recent change in medication  Asked patient to continue checking and recording blood pressure daily.  Asked patient to cal our office at [phone] to report any readings outside of the provider recommended ranges  Patient Self Care Activities:  . Currently UNABLE TO independently completely bathe, drive, perform housekeeping duties*  . He is able to take his prepackaged medications on his own . Prepares his own meals       Follow up Plan The CM team will reach out to the patient again over the next 30 days.   [name], RN-BC, BSN Nurse Care Manager [facility] [phone]

## 2018-09-02 NOTE — Patient Instructions (Signed)
Visit Information  Goals Addressed            This Visit's Progress     Patient Stated   . "I need to figure out what is going on with my heart" (pt-stated)       Nurse Case Manager Clinical Goal(s):   Over the next 6 months patient will demonstrate self-management of chest pain and will have no ED for angina   Interventions:   RNCM will reamain available for any new needs related to chest pain   Patient Self Care Activities:  . Patient monitors chest pain and reports  . Patient self administers medications . Patient calls the office with questions or concerns   *See Past Updates for previous documentation    . "I quit smoking around 06/06/2018 and I want to make sure that I don't start back" (pt-stated)       Current Barriers:  [name] Knowledge Deficits related to smoking cessation resources . Lacks caregiver support.  Lauralyn Primes barriers  Nurse Case Manager Clinical Goal(s):  [name] Over the next 90 days patient continue smoking cessation  Interventions:  . Continued to provide encouragement and praise . Explained how the body changes at different intervals after smoking cessation . Discussed the short-term and long-term role that cigarettes and nicotine play in vasoconstriction and suggested that smoking cessation may play a role in lowering his blood pressure.   Patient Self Care Activities:  . Currently UNABLE TO independently perform all ADLs or drive . self administers medications as prescribed . Attends church or other social activities . Calls provider office for new concerns or questions  Plan:  . Patient will reach out to me or [name] if doesn't feel like he might smoke . RNCM will remain available as needed and will follow-up over the next 30 days   Please see past updates related to this goal by clicking on the "Past Updates" button in the selected goal    [name], RN-BC, BSN Nurse Case Manager [place] [phone]         . "I want to keep my medications straight" (pt-stated)       Current Barriers:  [name] Knowledge Deficits related to medication management . Lacks caregiver support.  . Film/video editor.  . Literacy barriers . Transportation barriers . Cognitive Deficits  Nurse Case Manager Clinical Goal(s):  [name] Over the next 90 days, patient will continue to receive prepackaged weekly medications from [facility] . Over the next 90 days, patient will call RNCM at [phone] with any medication related concerns  Interventions:   Reviewed current medication  List  Verified that no changes have been made since our last visit  Advised patient to contact me at [phone] if he has any questions or concerns or if any changes are made to his medications  Patient Self Care Activities:  . Currently UNABLE TO independently drive. . Attends all scheduled provider appointments with arranged transportation. .   Please see past updates related to this goal by clicking on the "Past Updates" button in the selected goal       . "I want to lose some of this weight" (pt-stated)       Current Barriers:  . Literacy barriers  . Knowledge deficit related to proper weight management techniques  Nurse Case Manager Clinical Goal(s):  [name] Over the next 30 days, patient will verbalize understanding of plan for weight loss  . Over the next 30 days patient will state  understanding of need for weight management and it's health benefits . Over the next 60 days patient will experience a weight loss of 4 to 8 pounds.    Interventions:   Discussed current diet  Recommended portion control  Recommended lean protein, complex carbs (fruits, vegetables, whole grains, nuts)  Decrease simple carbs like white sugar, white bread, candy, soda, etc  Patient Self Care Activities:  . Currently UNABLE TO independently perform all ADLs independently . Self administers medications as prescribed (in pill pack) . Calls  provider office for new concerns or questions    Please see past updates related to this goal by clicking on the "Past Updates" button in the selected goal        . "I want to make sure my blood pressure stays under control" (pt-stated)       Current Barriers:  [name] Knowledge Deficits related to blood pressure readings and management . Lacks caregiver support.   . Cognitive deficits . illiterate  Nurse Case Manager Clinical Goal(s):   Over the next 30 days, patient will continue to take medications as prescribed  Over the next 30 days, patient will continue to receive prepackaged weekly medications from [facility]  Interventions:   Reviewed chart and previous notes  Discussed home blood pressure readings with patient. He did not have those readily available during call but reports that they have been normal  Discussed hypotensive symptoms with patient and he denies having any since most recent change in medication  Asked patient to continue checking and recording blood pressure daily.  Asked patient to cal our office at [phone] to report any readings outside of the provider recommended ranges  Patient Self Care Activities:  . Currently UNABLE TO independently completely bathe, drive, perform housekeeping duties*  . He is able to take his prepackaged medications on his own . Prepares his own meals  Please see past updates related to this goal by clicking on the "Past Updates" button in the selected goal          The patient verbalized understanding of instructions provided today and declined a print copy of patient instruction materials.   The CM team will reach out to the patient again over the next 30 days.   [name], RN-BC, BSN Nurse Care Manager [facility] [phone]

## 2018-09-08 ENCOUNTER — Telehealth: Payer: Self-pay | Admitting: Family Medicine

## 2018-09-14 ENCOUNTER — Ambulatory Visit: Payer: Self-pay | Admitting: Licensed Clinical Social Worker

## 2018-09-14 ENCOUNTER — Telehealth: Payer: Self-pay | Admitting: Family Medicine

## 2018-09-14 DIAGNOSIS — I252 Old myocardial infarction: Secondary | ICD-10-CM

## 2018-09-14 DIAGNOSIS — I209 Angina pectoris, unspecified: Secondary | ICD-10-CM

## 2018-09-14 DIAGNOSIS — F411 Generalized anxiety disorder: Secondary | ICD-10-CM

## 2018-09-14 DIAGNOSIS — Z550 Illiteracy and low-level literacy: Secondary | ICD-10-CM

## 2018-09-14 DIAGNOSIS — G809 Cerebral palsy, unspecified: Secondary | ICD-10-CM

## 2018-09-14 DIAGNOSIS — I25118 Atherosclerotic heart disease of native coronary artery with other forms of angina pectoris: Secondary | ICD-10-CM

## 2018-09-14 DIAGNOSIS — R482 Apraxia: Secondary | ICD-10-CM

## 2018-09-14 NOTE — Telephone Encounter (Signed)
Pt is scheduled for 6/19 with Dr Livia Snellen, pt would like to know if you could help set him up for a ride here.

## 2018-09-14 NOTE — Chronic Care Management (AMB) (Signed)
  Care Management Note   [name] is a 63 y.o. year old male who is a primary care patient of [name], [name], MD. The CM team was consulted for assistance with chronic disease management and care coordination.   Client contacted [facility] on 09/14/2018 to speak with [name] regarding transport need for client to go to and from client appointment with Dr. Livia Snellen at [facility] on 10/07/2018.  [name] contacted [employer], BSW with [facility] to talk with her about transport arrangements for client to and from 10/07/2018 appointment. Amber will contact transport agency to inquire about availability for patient transport support on 10/07/2018 for patient appointment. [name] also called client on 09/14/2018 and informed client that [name] had requested that [employer], [place] arrange transport assistance for client to and from client appointment with Dr. Livia Snellen on 10/07/2018. Client appreciated call from [name] on 09/14/18.  Client reported that he had his prescribed medications and he is taking medications as prescribed He said he is eating adequately. Client spoke of legal issues faced by client. Client is represented by an attorney related to client's legal issues. Client and [name] spoke of client's financial needs.Client said he sleeps well. Client said he is not smoking.  He said he is wheezing occasionally.  He said he has inhaler and is using inhaler as prescribed.    Follow Up Plan: [name] to call client as scheduled to assess psychosocial needs of client at that time  [name].Lynetta Tomczak MSW, [name] Licensed Clinical Social Worker [facility] Care Management [phone]

## 2018-09-14 NOTE — Patient Instructions (Addendum)
Licensed Clinical Social Worker Visit Information  Materials Provided: No  Client contacted [facility] on 09/14/2018 to speak with [name] regarding transport need for client to go to and from client appointment with Dr. Livia Snellen at [facility] on 10/07/2018.  [name] contacted [employer], BSW with [facility] to talk with her about transport arrangements for client to and from 10/07/2018 appointment. Amber will contact transport agency to inquire about availability for patient transport support on 10/07/2018 for patient appointment. [name] also called client on 09/14/2018 and informed client that [name] had requested that [employer], [place] arrange transport assistance for client to and from client appointment with Dr. Livia Snellen on 10/07/2018. Client appreciated call from [name] on 09/14/18.  Client reported that he had his prescribed medications and he is taking medications as prescribed He said he is eating adequately. Client spoke of legal issues faced by client. Client is represented by an attorney related to client's legal issues. Client and [name] spoke of client's financial needs.Client said he sleeps well. Client said he is not smoking.  He said he is wheezing occasionally.  He said he has inhaler and is using inhaler as prescribed.    Follow Up Plan: [name] to call client as scheduled to assess psychosocial needs of client.  The patient verbalized understanding of instructions provided today and declined a print copy of patient instruction materials.   [name].Vashon Riordan MSW, [name] Licensed Clinical Social Worker [facility] Care Management [phone]

## 2018-09-19 ENCOUNTER — Ambulatory Visit: Payer: Self-pay | Admitting: Licensed Clinical Social Worker

## 2018-09-19 DIAGNOSIS — I25118 Atherosclerotic heart disease of native coronary artery with other forms of angina pectoris: Secondary | ICD-10-CM

## 2018-09-19 DIAGNOSIS — I209 Angina pectoris, unspecified: Secondary | ICD-10-CM

## 2018-09-19 DIAGNOSIS — F411 Generalized anxiety disorder: Secondary | ICD-10-CM

## 2018-09-19 DIAGNOSIS — G809 Cerebral palsy, unspecified: Secondary | ICD-10-CM

## 2018-09-19 DIAGNOSIS — Z550 Illiteracy and low-level literacy: Secondary | ICD-10-CM

## 2018-09-19 DIAGNOSIS — R482 Apraxia: Secondary | ICD-10-CM

## 2018-09-19 DIAGNOSIS — I252 Old myocardial infarction: Secondary | ICD-10-CM

## 2018-09-19 NOTE — Patient Instructions (Signed)
Licensed Clinical Social Worker Visit Information    Materials Provided: No  Client reported that he had his prescribed medications and was taking medications as prescribed. He said home health aide assisted him as scheduled today at his home.  He said home health aide is scheduled also to assist him tomorrow in the home. He said he had purchased a Moped recently to help him with some transport needs. He said he has a helmet to wear when riding Moped, has insurance on [employer], has tag on Moped and has lock and security cable for Moped. He said he visited his sister recently. His sister helps him procure needed food items.  He is scheduled for an appointment with Dr. Livia Snellen at [facility] on 10/07/2018.  Amber Chrismon, BSW with [facility] is making transport arrangements for client to transport to and from 10/07/2018 medical appointment with Dr. Livia Snellen. Client said he received a letter recently related to heart monitor. He was unsure of what letter stated due to literacy issues he faces. [name] encouraged Cordney to get assistance from home health aide when she visits to see if Jani and home health aide could call RN or [name] to read letter to RN or [name]. [place] agreed to this plan.   He has previously seen Dr. Lavonna Monarch as cardiologist. [name] and RN [name] have collaborated about client needs. Client also said he is very tired in the morning and it takes him a while to wake up. He wondered if this fatigue in the morning was a side affect of any medication he was prescribed. Client was in good mood, happy he has Moped. He hopes also to purchase a small window air conditioner to help in cooling mobile home this Summer. [name] encouraged client to call [name] or RN CM as needed to discuss needs of client. Client has phone number for [name] and has phone number for RNCM  Follow Up Plan: [name] to call client as scheduled to assess needs of client  The patient verbalized understanding of instructions provided today and declined a  print copy of patient instruction materials.   [name].Pamula Luther MSW, [name] Licensed Clinical Social Worker [facility] Care Management [phone]

## 2018-09-19 NOTE — Chronic Care Management (AMB) (Signed)
  Care Management Note   [name] is a 63 y.o. year old male who is a primary care patient of [name], James Gash, MD. The CM team was consulted for assistance with chronic disease management and care coordination.   I reached out to [name] by phone today.   Review of patient status, including review of consultants reports, relevant laboratory and other test results, and collaboration with appropriate care team members and the patient's provider was performed as part of comprehensive patient evaluation and provision of chronic care management services.     Chronic Care Management from 04/11/2018 in [place] [facility]  PHQ-9 Total Score  5     Client reported that he had his prescribed medications and was taking medications as prescribed. He said home health aide assisted him as scheduled today at his home.  He said home health aide is scheduled also to assist him tomorrow in the home. He said he had purchased a Moped recently to help him with some transport needs. He said he has a helmet to wear when riding Moped, has insurance on [place], has tag on Moped and has lock and security cable for Moped. He said he visited his sister recently. His sister helps him procure needed food items.  He is scheduled for an appointment with Dr. Livia Hale at [facility] on 10/07/2018.  [name], BSW with [name] is making transport arrangements for client to transport to and from 10/07/2018 medical appointment with Dr. Livia Hale. Client said he received a letter recently related to heart monitor. He was unsure of what letter stated due to literacy issues he faces. [name] encouraged [name] to get assistance from home health aide when she visits to see if [name] and home health aide could call RN or [name] to read letter to RN or [name]. [name].   He has previously seen [name] as cardiologist. [name] have collaborated about client needs. Client also said he is very  tired in the morning and it takes him a while to wake up. He wondered if this fatigue in the morning was a side affect of any medication he was prescribed. Client was in good mood, happy he has Moped. He hopes also to purchase a small window air conditioner to help in cooling mobile home this Summer. [name] encouraged client to call [name] or RN CM as needed to discuss needs of client. Client has phone number for [name] and has phone number for RNCM  Follow Up Hale: [name] to call client as scheduled to assess needs of client.   [name].Lestat Golob MSW, [name] Licensed Clinical Social Worker [facility] Care Management [phone]

## 2018-09-20 ENCOUNTER — Ambulatory Visit: Payer: Self-pay | Admitting: Licensed Clinical Social Worker

## 2018-09-20 DIAGNOSIS — G809 Cerebral palsy, unspecified: Secondary | ICD-10-CM

## 2018-09-20 DIAGNOSIS — I252 Old myocardial infarction: Secondary | ICD-10-CM

## 2018-09-20 DIAGNOSIS — R482 Apraxia: Secondary | ICD-10-CM

## 2018-09-20 DIAGNOSIS — Z550 Illiteracy and low-level literacy: Secondary | ICD-10-CM

## 2018-09-20 DIAGNOSIS — I209 Angina pectoris, unspecified: Secondary | ICD-10-CM

## 2018-09-20 DIAGNOSIS — I25118 Atherosclerotic heart disease of native coronary artery with other forms of angina pectoris: Secondary | ICD-10-CM

## 2018-09-20 DIAGNOSIS — F411 Generalized anxiety disorder: Secondary | ICD-10-CM

## 2018-09-20 NOTE — Patient Instructions (Signed)
Licensed Clinical Social Worker Visit Information  Materials Provided: No  I reached out to [employer] by phone today.   [name] again encouraged [name] to seek assistance from home health aide in calling RNCM or [name] when home health aide is present to read to [facility] or [name] letter client received recently regarding his medical needs. Cristoval said that when aide makes her next visit with him he will call RNCM or [name] and he and aide will convey information to RN or [name] about letter he received recently on his medical needs. Client said he had adequate food supply and said his sister helped him recently get needed food items.  Follow Up Plan: [name] to call client as scheduled to assess client needs   The patient verbalized understanding of instructions provided today and declined a print copy of patient instruction materials.   [name].Braelyn Jenson MSW, [name] Licensed Clinical Social Worker [facility] Care Management [phone]

## 2018-09-20 NOTE — Chronic Care Management (AMB) (Signed)
  Care Management Note   [name] is a 63 y.o. year old male who is a primary care patient of [name], [name], MD. The CM team was consulted for assistance with chronic disease management and care coordination.   I reached out to [place] by phone today.   Review of patient status, including review of consultants reports, relevant laboratory and other test results, and collaboration with appropriate care team members and the patient's provider was performed as part of comprehensive patient evaluation and provision of chronic care management services.    Social Determinants of Health:Risk of tobacco exposure; occasional social isolation  [name] again encouraged [name] to seek assistance from home health aide in calling RNCM or [name] when home health aide is present to read to [name] or [name] letter [name] received recently regarding his medical needs. [name] said that when aide makes her next visit with him he will call RNCM or [name] and he and aide will convey information to RN or [name] about letter he received recently on his medical needs. [name] said he had adequate food supply and said his sister helped him recently get needed food items.  Follow Up Plan: [name] to call [name] as scheduled to assess [name] needs   Carrington Olazabal.Trachelle Low MSW, [name] Licensed Clinical Social Worker [facility] Care [facility] [phone]

## 2018-09-23 ENCOUNTER — Other Ambulatory Visit: Payer: Self-pay | Admitting: Family Medicine

## 2018-09-26 ENCOUNTER — Ambulatory Visit (INDEPENDENT_AMBULATORY_CARE_PROVIDER_SITE_OTHER): Payer: Medicare Other | Admitting: *Deleted

## 2018-09-26 ENCOUNTER — Other Ambulatory Visit: Payer: Self-pay

## 2018-09-26 ENCOUNTER — Telehealth: Payer: Self-pay | Admitting: Family Medicine

## 2018-09-26 ENCOUNTER — Ambulatory Visit: Payer: Medicare Other | Admitting: *Deleted

## 2018-09-26 VITALS — BP 113/68 | HR 94

## 2018-09-26 DIAGNOSIS — I10 Essential (primary) hypertension: Secondary | ICD-10-CM

## 2018-09-26 DIAGNOSIS — E785 Hyperlipidemia, unspecified: Secondary | ICD-10-CM

## 2018-09-26 DIAGNOSIS — G809 Cerebral palsy, unspecified: Secondary | ICD-10-CM

## 2018-09-26 NOTE — Telephone Encounter (Signed)
Appt to discuss has been scheduled

## 2018-09-26 NOTE — Telephone Encounter (Signed)
Pt is wanting to know if he is sleeping to much because his cholesterol is up and wants to know if dr stacks can help with him sleeping so much

## 2018-09-27 ENCOUNTER — Ambulatory Visit: Payer: Medicare Other | Admitting: Family Medicine

## 2018-09-27 ENCOUNTER — Ambulatory Visit: Payer: Medicare Other | Admitting: *Deleted

## 2018-09-27 DIAGNOSIS — G809 Cerebral palsy, unspecified: Secondary | ICD-10-CM

## 2018-09-27 DIAGNOSIS — I10 Essential (primary) hypertension: Secondary | ICD-10-CM

## 2018-09-27 DIAGNOSIS — F411 Generalized anxiety disorder: Secondary | ICD-10-CM

## 2018-09-28 NOTE — Patient Instructions (Signed)
Visit Information  Goals Addressed            This Visit's Progress     Patient Stated   . "I want to keep my medications straight" (pt-stated)       Current Barriers:  [name] Knowledge Deficits related to medication management . Lacks caregiver support.  . Film/video editor.  . Literacy barriers . Transportation barriers . Cognitive Deficits  Nurse Case Manager Clinical Goal(s):  [name] Over the next 90 days, patient will continue to receive prepackaged weekly medications from [facility] . Over the next 90 days, patient will call RNCM at [phone] with any medication related concerns  Interventions:   Discussed current medication  Patient has pill packs delivered by [facility] . He received a separate bottle of atorvastatin 40mg  this week.  Talked with [facility] and there was a mixup. The atorvastatin is in the pill packet and he does not need the separate bottle. She will send someone out to pick it up.  I will notify patient  Patient Self Care Activities:  . Currently UNABLE TO independently drive. . Attends all scheduled provider appointments with arranged transportation. .   Please see past updates related to this goal by clicking on the "Past Updates" button in the selected goal       . "I want to know why I'm feeling so tired during the day" (pt-stated)       Current Barriers:  [name] Knowledge Deficits related to cause of fatigue. . Film/video editor.  . Literacy barriers . Transportation barriers . Cognitive Deficits  Nurse Case Manager Clinical Goal(s):  [name] Over the next 7 days, patient will work with Consulting civil engineer to address needs related to fatigue.  Interventions:  . Advised patient to check and record daily blood pressures at home.  . Reviewed medications with patient and discussed metoprolol, lisinopril, and celebrex. [name] with triage nurse, [name],  regarding in office BP check for today at 2:30  Patient Self Care  Activities:  . Performs some ADLs independently. He does have an aid that comes in for a few hours each day. . Drives scooter to do some AIDLs . Calls to make appointments or ask questions  Initial goal documentation      . "I want to make sure my blood pressure stays under control" (pt-stated)       Current Barriers:  [name] Knowledge Deficits related to blood pressure readings and management . Lacks caregiver support.   . Cognitive deficits . illiterate  Nurse Case Manager Clinical Goal(s):   Over the next 30 days, patient will continue to take medications as prescribed  Over the next 30 days, patient will continue to receive prepackaged weekly medications from [facility]  Over the next 30 days, Patient will check and record his blood pressure daily  Over the next 30 days, patient will work with Consulting civil engineer and PCP top maintain BP within provider recommended parameters  Interventions:   Reviewed chart and previous notes  Discussed home blood pressure readings with patient. He did not have those readily available during call but reports that they have been normal  Discussed hypotensive symptoms with patient  States that he feels more tired during the day than normal and he's wanting to sleep more  Scheduled patient for in office BP check with nurse  Planned collaboration with PCP regarding BP reading and increased fatigue and daytime sleepiness  Asked patient to continue checking and recording blood pressure daily.  Asked patient to call our office at [phone] to report any readings outside of the provider recommended ranges  Asked patient to call our office at [phone] with any new or worsening symptoms  Patient Self Care Activities:   . He is able to take his prepackaged medications on his own . Prepares his own meals  Please see past updates related to this goal by clicking on the "Past Updates" button in the selected goal       . 'I want to know  what this new bottle of medicine is" (pt-stated)       Current Barriers:  [name] Knowledge Deficits related to medications . Lacks caregiver support.  . Film/video editor.  . Literacy barriers . Transportation barriers . Cognitive Deficits  Nurse Case Manager Clinical Goal(s):  [name] Over the next 7 days, patient will work with Consulting civil engineer to address needs related to medication Management  Interventions:  . Reviewed medications with patient and discussed atoravastatin 40mg . #90 came in a seperate green bottle from [name] instead of in his pill pack. [name] with [place] regarding atoravastatin and confirmed that it was not included in his pill pack as well as in a seperate bottle.   Patient Self Care Activities:  . Self administers medications as prescribed. Medications are prepacked for morning an nighttime use.   Initial goal documentation         The patient verbalized understanding of instructions provided today and declined a print copy of patient instruction materials.   A HIPPA compliant phone message was left for the patient providing contact information and requesting a return call.  The care management team will reach out to the patient again over the next 2 days.   [name], RN-BC, BSN Nurse Care Manager [facility] [phone]

## 2018-09-28 NOTE — Chronic Care Management (AMB) (Signed)
Chronic Care Management   Follow Up Note   09/26/2018 Name: [name] MRN: [id number] DOB: 01-16-1956  Referred by: James Fraise, MD Reason for referral : Chronic Care Management (RNCM follow up on patient's medication question)   [name] is a 63 y.o. year old male who is a primary care patient of [name], James Gash, MD. The CCM team was consulted for assistance with chronic disease management and care coordination needs.    Review of patient status, including review of consultants reports, relevant laboratory and other test results, and collaboration with appropriate care team members and the patient's provider was performed as part of comprehensive patient evaluation and provision of chronic care management services.    I reached out to [name] by telephone because during his talk with James [name], [name] he relayed that the pharmacy delivered an extra bottle of medicine and that his personal care assistant told him that it was for cholesterol.   Goals Addressed      Patient Stated   . "I want to keep my medications straight" (pt-stated)       Current Barriers:  [name] Knowledge Deficits related to medication management . Lacks caregiver support.  . Film/video editor.  . Literacy barriers . Transportation barriers . Cognitive Deficits  Nurse Case Manager Clinical Goal(s):  [name] Over the next 90 days, patient will continue to receive prepackaged weekly medications from [facility] . Over the next 90 days, patient will call RNCM at [phone] with any medication related concerns  Interventions:   Discussed current medication  Patient has pill packs delivered by [facility] . He received a separate bottle of atorvastatin 40mg  this week.  Talked with [facility] and there was a mixup. The atorvastatin is in the pill packet and he does not need the separate bottle. She will send someone out to pick it up.  Notified patient  Patient Self Care Activities:  .  Currently UNABLE TO independently drive. . Attends all scheduled provider appointments with arranged transportation. .   Please see past updates related to this goal by clicking on the "Past Updates" button in the selected goal       . "I want to know why I'm feeling so tired during the day" (pt-stated)       Current Barriers:  [name] Knowledge Deficits related to cause of fatigue. . Film/video editor.  . Literacy barriers . Transportation barriers . Cognitive Deficits  Nurse Case Manager Clinical Goal(s):  [name] Over the next 7 days, patient will work with Consulting civil engineer to address needs related to fatigue.  Interventions:  . Advised patient to check and record daily blood pressures at home.  . Reviewed medications with patient and discussed metoprolol, lisinopril, and celebrex. [name] with triage nurse, [name],  regarding in office BP check for today at 2:30  Patient Self Care Activities:  . Performs some ADLs independently. He does have an aid that comes in for a few hours each day. . Drives scooter to do some AIDLs . Calls to make appointments or ask questions  Initial goal documentation      . "I want to make sure my blood pressure stays under control" (pt-stated)       Current Barriers:  [name] Knowledge Deficits related to blood pressure readings and management . Lacks caregiver support.   . Cognitive deficits . illiterate  Nurse Case Manager Clinical Goal(s):   Over the next 30 days, patient will continue to take medications as prescribed  Over the next 30 days, patient will continue to receive prepackaged weekly medications from [facility]  Over the next 30 days, Patient will check and record his blood pressure daily  Over the next 30 days, patient will work with Consulting civil engineer and PCP top maintain BP within provider recommended parameters  Interventions:   Reviewed chart and previous notes  Discussed home blood pressure readings with  patient. He did not have those readily available during call but reports that they have been normal  Discussed hypotensive symptoms with patient  States that he feels more tired during the day than normal and he's wanting to sleep more  Scheduled patient for in office BP check with nurse  Planned collaboration with PCP regarding BP reading and increased fatigue and daytime sleepiness  Asked patient to continue checking and recording blood pressure daily.  Asked patient to call our office at [phone] to report any readings outside of the provider recommended ranges  Asked patient to call our office at [phone] with any new or worsening symptoms  Patient Self Care Activities:   . He is able to take his prepackaged medications on his own . Prepares his own meals  Please see past updates related to this goal by clicking on the "Past Updates" button in the selected goal       . 'I want to know what this new bottle of medicine is" (pt-stated)       Current Barriers:  [name] Knowledge Deficits related to medications . Lacks caregiver support.  . Film/video editor.  . Literacy barriers . Transportation barriers . Cognitive Deficits  Nurse Case Manager Clinical Goal(s):  [name] Over the next 7 days, patient will work with Consulting civil engineer to address needs related to medication Management  Interventions:  . Reviewed medications with patient and discussed atoravastatin 40mg . #90 came in a seperate green bottle from [facility] instead of in his pill pack. [name] with [facility] regarding atoravastatin and confirmed that it was not included in his pill pack as well as in a seperate bottle.   Patient Self Care Activities:  . Self administers medications as prescribed. Medications are prepacked for morning an nighttime use.   Initial goal documentation         Follow Up Plan A HIPPA compliant phone message was left for the patient providing contact information  and requesting a return call.  The care management team will reach out to the patient again over the next 2 days.    [name], RN-BC, BSN Nurse Care Manager [facility] [phone]

## 2018-09-29 ENCOUNTER — Ambulatory Visit (INDEPENDENT_AMBULATORY_CARE_PROVIDER_SITE_OTHER): Payer: Medicare Other | Admitting: Licensed Clinical Social Worker

## 2018-09-29 DIAGNOSIS — I252 Old myocardial infarction: Secondary | ICD-10-CM

## 2018-09-29 DIAGNOSIS — F411 Generalized anxiety disorder: Secondary | ICD-10-CM

## 2018-09-29 DIAGNOSIS — I209 Angina pectoris, unspecified: Secondary | ICD-10-CM

## 2018-09-29 DIAGNOSIS — R482 Apraxia: Secondary | ICD-10-CM

## 2018-09-29 DIAGNOSIS — G809 Cerebral palsy, unspecified: Secondary | ICD-10-CM

## 2018-09-29 DIAGNOSIS — Z550 Illiteracy and low-level literacy: Secondary | ICD-10-CM

## 2018-09-29 DIAGNOSIS — I25118 Atherosclerotic heart disease of native coronary artery with other forms of angina pectoris: Secondary | ICD-10-CM

## 2018-09-29 NOTE — Patient Instructions (Addendum)
Licensed Clinical Social Worker Visit Information  Materials Provided: No    [name] was contacted via phone by [name] today. Client had called [name] recently to talk about client's in home care support. Client was concerned about the amount of in home care support he receives weekly.  Client has phone number of [facility] [phone]) and has called [facility] to talk about his in home care support.  Client has received in home care from several home health agencies in the area. [name] encouraged client via phone to call [facility] and talk with agency about his in home care concerns/questions. [name] said he would call [facility] to talk about his in home care support services.[name] was contacted via phone by [name] today. Client had called [name] recently to talk about client's in home care support. Client was concerned about the amount of in home care support he receives weekly.  Client has phone number of [facility] [phone]) and has called [facility] to talk about his in home care support.  Client has received in home care from several home health agencies in the area. [name] encouraged client via phone to call [facility] and talk with agency about his in home care concerns/questions. [name] said he would call [facility] to talk about his in home care support services.    Follow Up Plan: [name] to call client as scheduled to assess client needs  The patient verbalized understanding of instructions provided today and declined a print copy of patient instruction materials.   [name].Danni Shima MSW, [name] Licensed Clinical Social Worker [facility] Care Management [phone]

## 2018-09-29 NOTE — Chronic Care Management (AMB) (Signed)
Chronic Care Management   Follow Up Note   09/27/2018 Name: [name] MRN: [id number] DOB: 20-Feb-1956  Referred by: [name], MD Reason for referral : Chronic Care Management (RNCM follow up)   [name] is a 63 y.o. year old male who is a primary care patient of [name], [name], MD. The CCM team was consulted for assistance with chronic disease management and care coordination needs.    Review of patient status, including review of consultants reports, relevant laboratory and other test results, and collaboration with appropriate care team members and the patient's provider was performed as part of comprehensive patient evaluation and provision of chronic care management services.    I spoke with [name] by telephone.   Goals Addressed      Patient Stated   . "I want to make sure my blood pressure stays under control" (pt-stated)       Current Barriers:  [name] Knowledge Deficits related to blood pressure readings and management . Lacks caregiver support.   . Cognitive deficits . illiterate  Nurse Case Manager Clinical Goal(s):   Over the next 30 days, patient will continue to take medications as prescribed  Over the next 30 days, patient will continue to receive prepackaged weekly medications from [facility]  Over the next 30 days, Patient will check and record his blood pressure daily  Over the next 30 days, patient will work with Consulting civil engineer and PCP top maintain BP within provider recommended parameters  Interventions:   Reviewed chart and previous notes  Discussed home blood pressure readings with patient. He did not have those readily available during call but reports that they have been normal  In office blood pressure reviewed  BP Readings from Last 1 Encounters:  09/26/18  113/68   Asked patient to continue checking and recording blood pressure daily.  Asked patient to call our office at [phone] to report any readings outside of the  provider recommended ranges  Asked patient to call our office at [phone] with any new or worsening symptoms  Patient Self Care Activities:   . He is able to take his prepackaged medications on his own . Prepares his own meals  Please see past updates related to this goal by clicking on the "Past Updates" button in the selected goal       . COMPLETED: 'I want to know what this new bottle of medicine is" (pt-stated)       Current Barriers:  [name] Knowledge Deficits related to medications . Lacks caregiver support.  . Film/video editor.  . Literacy barriers . Transportation barriers . Cognitive Deficits  Nurse Case Manager Clinical Goal(s):  [name] Over the next 7 days, patient will work with Consulting civil engineer to address needs related to medication Management  Interventions:  . Reviewed medications with patient and discussed atoravastatin 40mg . #90 came in a seperate green bottle from [facility] instead of in his pill pack. [name] with [place] regarding atoravastatin and confirmed that it was not included in his pill pack as well as in a seperate bottle.  [place] to send someone out to pick up the extra atorvastatin bottle  Patient Self Care Activities:  . Self administers medications as prescribed. Medications are prepacked for morning an nighttime use.   Please see past updates related to this goal by clicking on the "Past Updates" button in the selected goal          Follow Up Plan The  care management team will reach out to the patient again over the next 30 days.     [name], RN-BC, BSN Nurse Care Manager [facility] [phone]

## 2018-09-29 NOTE — Patient Instructions (Signed)
Visit Information  Goals Addressed            This Visit's Progress     Patient Stated   . "I want to make sure my blood pressure stays under control" (pt-stated)       Current Barriers:  [name] Knowledge Deficits related to blood pressure readings and management . Lacks caregiver support.   . Cognitive deficits . illiterate  Nurse Case Manager Clinical Goal(s):   Over the next 30 days, patient will continue to take medications as prescribed  Over the next 30 days, patient will continue to receive prepackaged weekly medications from [facility]  Over the next 30 days, Patient will check and record his blood pressure daily  Over the next 30 days, patient will work with Consulting civil engineer and PCP top maintain BP within provider recommended parameters  Interventions:   Reviewed chart and previous notes  Discussed home blood pressure readings with patient. He did not have those readily available during call but reports that they have been normal  In office blood pressure reviewed  BP Readings from Last 1 Encounters:   09/26/18  113/68   Asked patient to continue checking and recording blood pressure daily.  Asked patient to call our office at [phone] to report any readings outside of the provider recommended ranges  Asked patient to call our office at [phone] with any new or worsening symptoms  Patient Self Care Activities:   . He is able to take his prepackaged medications on his own . Prepares his own meals  Please see past updates related to this goal by clicking on the "Past Updates" button in the selected goal       . COMPLETED: 'I want to know what this new bottle of medicine is" (pt-stated)       Current Barriers:  [name] Knowledge Deficits related to medications . Lacks caregiver support.  . Film/video editor.  . Literacy barriers . Transportation barriers . Cognitive Deficits  Nurse Case Manager Clinical Goal(s):  [name] Over the next 7 days,  patient will work with Consulting civil engineer to address needs related to medication Management  Interventions:  . Reviewed medications with patient and discussed atoravastatin 40mg . #90 came in a seperate green bottle from [facility] instead of in his pill pack. [name] with [place] regarding atoravastatin and confirmed that it was not included in his pill pack as well as in a seperate bottle.  [place] to send someone out to pick up the extra atorvastatin bottle  Patient Self Care Activities:  . Self administers medications as prescribed. Medications are prepacked for morning an nighttime use.   Please see past updates related to this goal by clicking on the "Past Updates" button in the selected goal          The patient verbalized understanding of instructions provided today and declined a print copy of patient instruction materials.   The care management team will reach out to the patient again over the next 30 days.   [name], RN-BC, BSN Nurse Care Manager [facility] [phone]

## 2018-09-29 NOTE — Chronic Care Management (AMB) (Signed)
  Chronic Care Management    Clinical Social Work CCM Outreach Note  09/29/2018 Name: [name] MRN: [id number] DOB: [birth year]  [name] is a 63 y.o. year old male who is a primary care patient of [name], [name], MD . The CCM team was consulted for assistance with psychosocial needs of client.   [name] was contacted via phone by [name] today. Client had called [name] recently to talk about client's in home care support. Client was concerned about the amount of in home care support he receives weekly.  Client has phone number of [facility] [phone]) and has called [facility] to talk about his in home care support.  Client has received in home care from several home health agencies in the area. [name] encouraged client via phone to call [facility] and talk with agency about his in home care concerns/questions. [name] said he would call [facility] to talk about his in home care support services.   Follow Up Plan: [name] to call client as scheduled to assess client needs.  [name].Ellorie Kindall MSW, [name] Licensed Clinical Social Worker [facility] Care Management [phone]

## 2018-10-03 ENCOUNTER — Ambulatory Visit: Payer: Self-pay | Admitting: Licensed Clinical Social Worker

## 2018-10-03 ENCOUNTER — Other Ambulatory Visit: Payer: Self-pay

## 2018-10-03 DIAGNOSIS — E785 Hyperlipidemia, unspecified: Secondary | ICD-10-CM

## 2018-10-03 DIAGNOSIS — R482 Apraxia: Secondary | ICD-10-CM

## 2018-10-03 DIAGNOSIS — Z550 Illiteracy and low-level literacy: Secondary | ICD-10-CM

## 2018-10-03 DIAGNOSIS — I25118 Atherosclerotic heart disease of native coronary artery with other forms of angina pectoris: Secondary | ICD-10-CM

## 2018-10-03 DIAGNOSIS — I252 Old myocardial infarction: Secondary | ICD-10-CM

## 2018-10-03 DIAGNOSIS — F411 Generalized anxiety disorder: Secondary | ICD-10-CM

## 2018-10-03 DIAGNOSIS — I209 Angina pectoris, unspecified: Secondary | ICD-10-CM

## 2018-10-03 DIAGNOSIS — G809 Cerebral palsy, unspecified: Secondary | ICD-10-CM

## 2018-10-03 NOTE — Patient Outreach (Signed)
[facility]) Care Management  10/03/2018  [name] 1955/06/28 [id number]   Transportation arranged via [name] for appointment with Dr. Livia Snellen on 10/07/18.  Contacted patient to inform him of pick up time for 12:10 p.m.  [name], BSW Social Worker [phone]

## 2018-10-03 NOTE — Patient Instructions (Addendum)
Licensed Clinical Water engineer Provided: No  [name] reached out to [employer] today by phone .   Client has a scheduled appointment with Dr. Livia Snellen at [facility] on 10/07/2018.  Amber Chrismon, BSW with [facility] has made transport arrangements for client with [name] to travel to and from 10/07/2018 client appointment. Amber Chrismon BSW has notified client of transport arrangements. [name] called client today. [name] talked with client about client's upcoming appointment on 10/07/2018. [name] talked with client about home health aide support for client. Client said he had received a call from supervisor with home health agency. He said he talked with this supervisor about in home care aide support he had been receiving. [name] encouraged client to call [name] to discuss social work needs of client. [name] encouraged client to call RNCM to discuss nursing needs of client. Client was appreciative of call from [name] on 10/03/2018.   Follow Up Plan: [name] to call client as scheduled to assess client needs at that time.  The patient verbalized understanding of instructions provided today and declined a print copy of patient instruction materials.   [name].Doniesha Landau MSW, [name] Licensed Clinical Social Worker [facility] Care Management [phone]

## 2018-10-03 NOTE — Chronic Care Management (AMB) (Signed)
  Chronic Care Management    Clinical Social Work CCM Outreach Note  10/03/2018 Name: [name] MRN: [id number] DOB: 1955/10/31  DEAUNTE [name] is a 63 y.o. year old male who is a primary care patient of [name], [name], MD . The CCM team was consulted for assistance with assessment of psychosocial needs.   [name] reached out to [name] today by phone .   Client has a scheduled appointment with Dr. Livia Snellen at [facility] on 10/07/2018.  Amber Chrismon, BSW with [facility] has made transport arrangements for client with [name] to travel to and from 10/07/2018 client appointment. Amber Chrismon BSW has notified client of transport arrangements. [name] called client today. [name] talked with client about client's upcoming appointment on 10/07/2018. [name] talked with client about home health aide support for client. Client said he had received a call from supervisor with home health agency. He said he talked with this supervisor about in home care aide support he had been receiving. [name] encouraged client to call [name] to discuss social work needs of client. [name] encouraged client to call RNCM to discuss nursing needs of client. Client was appreciative of call from [name] on 10/03/2018.   Follow Up Plan: [name] to call client as scheduled to assess needs of client at that time.  [name].Geet Hosking MSW, [name] Licensed Clinical Social Worker [facility] Care Management [phone]

## 2018-10-06 ENCOUNTER — Other Ambulatory Visit: Payer: Self-pay

## 2018-10-07 ENCOUNTER — Ambulatory Visit: Payer: Self-pay | Admitting: Licensed Clinical Social Worker

## 2018-10-07 ENCOUNTER — Ambulatory Visit (INDEPENDENT_AMBULATORY_CARE_PROVIDER_SITE_OTHER): Payer: Medicare Other | Admitting: Family Medicine

## 2018-10-07 ENCOUNTER — Encounter: Payer: Self-pay | Admitting: Family Medicine

## 2018-10-07 VITALS — BP 117/72 | HR 77 | Temp 98.6°F | Ht 72.0 in | Wt 228.6 lb

## 2018-10-07 DIAGNOSIS — R6889 Other general symptoms and signs: Secondary | ICD-10-CM | POA: Diagnosis not present

## 2018-10-07 DIAGNOSIS — I25118 Atherosclerotic heart disease of native coronary artery with other forms of angina pectoris: Secondary | ICD-10-CM

## 2018-10-07 DIAGNOSIS — I209 Angina pectoris, unspecified: Secondary | ICD-10-CM

## 2018-10-07 DIAGNOSIS — E785 Hyperlipidemia, unspecified: Secondary | ICD-10-CM | POA: Diagnosis not present

## 2018-10-07 DIAGNOSIS — I10 Essential (primary) hypertension: Secondary | ICD-10-CM

## 2018-10-07 DIAGNOSIS — R482 Apraxia: Secondary | ICD-10-CM

## 2018-10-07 DIAGNOSIS — I252 Old myocardial infarction: Secondary | ICD-10-CM

## 2018-10-07 DIAGNOSIS — G809 Cerebral palsy, unspecified: Secondary | ICD-10-CM

## 2018-10-07 DIAGNOSIS — F411 Generalized anxiety disorder: Secondary | ICD-10-CM

## 2018-10-07 DIAGNOSIS — Z550 Illiteracy and low-level literacy: Secondary | ICD-10-CM

## 2018-10-07 DIAGNOSIS — Z7689 Persons encountering health services in other specified circumstances: Secondary | ICD-10-CM | POA: Diagnosis not present

## 2018-10-07 NOTE — Patient Instructions (Addendum)
Licensed Clinical Social Worker Visit Information  Materials Provided: No   [name] met in person with [name] on 10/07/2018 at [facility] office. Client had a scheduled appointment today with Dr. Livia Snellen; [name] met with client at the office following client's appointment with Dr. Livia Snellen. Client reported that he had his prescribed medications and is taking medications as prescribed. Client said he was eating adequately. [name] and client have spoken recently about in home care support for client. [name] has given client the phone number for [facility] and encouraged him to call [facility] to inquire about his home health services. Client said he has called [facility] to talk about his in home services. Client said home health aide is helping client in the home about 3 times weekly. Aide comes to home of client for at least 2 hours 3 times weekly. Client said he is sleeping better now. Client said he is walking adequately. Client enjoys visiting with his sister.   Follow Up Plan: [name] to call client as scheduled to assess client needs at that time   The patient verbalized understanding of instructions provided today and declined a print copy of patient instruction materials.   [name].[name] MSW, [name] Licensed Clinical Social Worker [facility] Care Management [phone]

## 2018-10-07 NOTE — Chronic Care Management (AMB) (Signed)
  Chronic Care Management    Clinical Social Work CCM Outreach Note  10/07/2018 Name: [name] MRN: [id number] DOB: [birth year]  [name] is a 63 y.o. year old male who is a primary care patient of [name], [name], MD . The CCM team was consulted for assistance with assessment of psychosocial needs.   [name] met in person with [name] on 10/07/2018 at [facility] office.  Client had a scheduled appointment today with Dr. Livia Snellen; [name] met with client at the office following client's appointment with Dr. Livia Snellen. Client reported that he had his prescribed medications and is taking medications as prescribed. Client said he was eating adequately.  [name] and client have spoken recently about in home care support for client. [name] has given client the phone number for [facility] and encouraged him to call [facility] to inquire about his home health services. Client said he has called [facility] to talk about his in home services. Client said home health aide is helping client in the home about 3 times weekly.  Aide comes to home of client for at least 2 hours 3 times weekly. Client said he is sleeping better now.  Client said he is walking adequately.  Client enjoys visiting with his sister.    Follow Up Plan: [name] to call client as scheduled to assess client needs at that time  Nickoles Gregori.Nasrin Lanzo MSW, [name] Licensed Clinical Social Worker [facility] Care Management [phone]

## 2018-10-07 NOTE — Progress Notes (Signed)
Subjective:  Patient ID: [name], male    DOB: 12-09-1955  Age: 63 y.o. MRN: [id number]  CC: Medical Management of Chronic Issues (3 month ), Hypertension, and COPD   HPI [name] presents for  follow-up of hypertension. Patient has no history of headache chest pain or shortness of breath or recent cough. Patient also denies symptoms of TIA such as focal numbness or weakness. Patient denies side effects from medication. States taking it regularly.  Pt. Also takes statin for elevated cholesterol due to history of ASCVD. Denies any side effect of myalgia. Not having any chest pain or dyspnea.   Pt. Using inhalers as directed. Not having any whezing. Some chronic cough is at baseline.  History [name] has a past medical history of Cerebral palsy ([name]), Coronary artery disease, Depression, GERD (gastroesophageal reflux disease), Hypertension, Scoliosis, and Seizures ([name]).   He has a past surgical history that includes Coronary angioplasty with stent; Carpal tunnel release (Right, 08/13/2016); and LEFT HEART CATH AND CORONARY ANGIOGRAPHY (N/A, 07/10/2017).   His family history includes Alcohol abuse in his brother; CAD in his brother and sister; CAD (age of onset: 2) in his father; Diabetes in his mother; Heart disease in his father; Heart failure in his sister.He reports that he quit smoking about 4 months ago. His smoking use included cigarettes. He has a 20.50 pack-year smoking history. He has never used smokeless tobacco. He reports current alcohol use. He reports that he does not use drugs.  Current Outpatient Medications on File Prior to Visit  Medication Sig Dispense Refill  . acetaminophen (TYLENOL) 325 MG tablet Take 2 tablets (650 mg total) by mouth every 6 (six) hours as needed for mild pain or headache.    . albuterol (PROAIR HFA) 108 (90 Base) MCG/ACT inhaler 1-2 puffs every 6 hours as needed wheezing or shortness of breath. 17 g 10  . aspirin EC 81 MG tablet Take 1  tablet (81 mg total) by mouth daily. 30 tablet 11  . atorvastatin (LIPITOR) 40 MG tablet Take 1 capsule by mouth at bedtime.    [name] buPROPion (WELLBUTRIN SR) 150 MG 12 hr tablet Take 1 tablet (150 mg total) by mouth 2 (two) times daily. 180 tablet 0  . celecoxib (CELEBREX) 200 MG capsule Take 200 mg by mouth daily as needed for pain.    . COMBIVENT RESPIMAT 20-100 MCG/ACT AERS respimat Inhale 1 puff into the lungs every 6 (six) hours 4 g 0  . DULoxetine (CYMBALTA) 30 MG capsule Take 2 capsules (60 mg total) by mouth daily. WITH A FULL STOMACH AT SUPPER TIME 180 capsule 0  . fexofenadine (ALLEGRA) 180 MG tablet Take 1 tablet (180 mg total) by mouth daily. For allergy symptoms 90 tablet 2  . fluticasone (FLONASE) 50 MCG/ACT nasal spray Place 2 sprays into both nostrils daily as needed for allergies or rhinitis. 48 g 1  . fluticasone furoate-vilanterol (BREO ELLIPTA) 100-25 MCG/INH AEPB Inhale 1 puff into the lungs daily. 90 each 0  . isosorbide mononitrate (IMDUR) 30 MG 24 hr tablet Take 1 tablet (30 mg total) by mouth daily. 90 tablet 1  . metoprolol tartrate (LOPRESSOR) 25 MG tablet Take 1/2 tablet by mouth two times a day and include in pill packs 30 tablet 0  . nitroGLYCERIN (NITROSTAT) 0.4 MG SL tablet Place 1 tablet (0.4 mg total) under the tongue every 5 (five) minutes x 3 doses as needed for chest pain. 25 tablet 11  . pantoprazole (PROTONIX) 40  MG tablet Take 1 tablet (40 mg total) by mouth 2 (two) times daily. 180 tablet 0  . tamsulosin (FLOMAX) 0.4 MG CAPS capsule TAKE (1) CAPSULE DAILY 90 capsule 0   No current facility-administered medications on file prior to visit.     ROS Review of Systems  Constitutional: Negative.   HENT: Negative.   Eyes: Negative for visual disturbance.  Respiratory: Negative for cough and shortness of breath.   Cardiovascular: Negative for chest pain and leg swelling.  Gastrointestinal: Negative for abdominal pain, diarrhea, nausea and vomiting.   Genitourinary: Negative for difficulty urinating.  Musculoskeletal: Negative for arthralgias and myalgias.  Skin: Negative for rash.  Neurological: Negative for headaches.  Psychiatric/Behavioral: Negative for sleep disturbance.    Objective:  BP 117/72   Pulse 77   Temp 98.6 F (37 C) (Oral)   Ht 6' (1.829 m)   Wt 228 lb 9.6 oz (103.7 kg)   SpO2 94%   BMI 31.00 kg/m   BP Readings from Last 3 Encounters:  10/07/18 117/72  09/26/18 113/68  08/22/18 123/71    Wt Readings from Last 3 Encounters:  10/07/18 228 lb 9.6 oz (103.7 kg)  08/22/18 224 lb (101.6 kg)  08/18/18 224 lb (101.6 kg)     Physical Exam Constitutional:      General: He is not in acute distress.    Appearance: He is well-developed.  HENT:     Head: Normocephalic and atraumatic.     Right Ear: External ear normal.     Left Ear: External ear normal.     Nose: Nose normal.  Eyes:     Conjunctiva/sclera: Conjunctivae normal.     Pupils: Pupils are equal, round, and reactive to light.  Neck:     Musculoskeletal: Normal range of motion and neck supple.  Cardiovascular:     Rate and Rhythm: Normal rate and regular rhythm.     Heart sounds: Normal heart sounds. No murmur.  Pulmonary:     Effort: Pulmonary effort is normal. No respiratory distress.     Breath sounds: Normal breath sounds. No wheezing or rales.  Abdominal:     Palpations: Abdomen is soft.     Tenderness: There is no abdominal tenderness.  Musculoskeletal: Normal range of motion.  Skin:    General: Skin is warm and dry.  Neurological:     Mental Status: He is alert and oriented to person, place, and time.     Deep Tendon Reflexes: Reflexes are normal and symmetric.  Psychiatric:        Behavior: Behavior normal.        Thought Content: Thought content normal.        Judgment: Judgment normal.       Assessment & Plan:   [name] was seen today for medical management of chronic issues, hypertension and copd.  Diagnoses and all  orders for this visit:  Dyslipidemia, goal LDL below 70 -     Lipid panel  Essential hypertension -     CBC with Differential/Platelet -     CMP14+EGFR  Coronary artery disease of native artery of native heart with stable angina pectoris (HCC)   Allergies as of 10/07/2018      Reactions   Chantix [varenicline Tartrate] Nausea And Vomiting      Medication List       Accurate as of October 07, 2018 11:59 PM. If you have any questions, ask your nurse or doctor.        STOP  taking these medications   predniSONE 20 MG tablet Commonly known as: DELTASONE Stopped by: [name], MD     TAKE these medications   acetaminophen 325 MG tablet Commonly known as: TYLENOL Take 2 tablets (650 mg total) by mouth every 6 (six) hours as needed for mild pain or headache.   albuterol 108 (90 Base) MCG/ACT inhaler Commonly known as: ProAir HFA 1-2 puffs every 6 hours as needed wheezing or shortness of breath.   aspirin EC 81 MG tablet Take 1 tablet (81 mg total) by mouth daily.   atorvastatin 40 MG tablet Commonly known as: LIPITOR Take 1 capsule by mouth at bedtime.   buPROPion 150 MG 12 hr tablet Commonly known as: Wellbutrin SR Take 1 tablet (150 mg total) by mouth 2 (two) times daily.   celecoxib 200 MG capsule Commonly known as: CELEBREX Take 200 mg by mouth daily as needed for pain.   Combivent Respimat 20-100 MCG/ACT Aers respimat Generic drug: Ipratropium-Albuterol Inhale 1 puff into the lungs every 6 (six) hours   DULoxetine 30 MG capsule Commonly known as: CYMBALTA Take 2 capsules (60 mg total) by mouth daily. WITH A FULL STOMACH AT SUPPER TIME   fexofenadine 180 MG tablet Commonly known as: ALLEGRA Take 1 tablet (180 mg total) by mouth daily. For allergy symptoms   fluticasone 50 MCG/ACT nasal spray Commonly known as: FLONASE Place 2 sprays into both nostrils daily as needed for allergies or rhinitis.   fluticasone furoate-vilanterol 100-25 MCG/INH Aepb  Commonly known as: Breo Ellipta Inhale 1 puff into the lungs daily.   isosorbide mononitrate 30 MG 24 hr tablet Commonly known as: IMDUR Take 1 tablet (30 mg total) by mouth daily.   metoprolol tartrate 25 MG tablet Commonly known as: LOPRESSOR Take 1/2 tablet by mouth two times a day and include in pill packs   nitroGLYCERIN 0.4 MG SL tablet Commonly known as: NITROSTAT Place 1 tablet (0.4 mg total) under the tongue every 5 (five) minutes x 3 doses as needed for chest pain.   pantoprazole 40 MG tablet Commonly known as: PROTONIX Take 1 tablet (40 mg total) by mouth 2 (two) times daily.   tamsulosin 0.4 MG Caps capsule Commonly known as: FLOMAX TAKE (1) CAPSULE DAILY       No orders of the defined types were placed in this encounter.    Follow-up: Return in about 6 months (around 04/08/2019) for 6 month follow up .  [name], M.D.

## 2018-10-08 LAB — CMP14+EGFR
ALT: 14 IU/L (ref 0–44)
AST: 18 IU/L (ref 0–40)
Albumin/Globulin Ratio: 1.6 (ref 1.2–2.2)
Albumin: 4 g/dL (ref 3.8–4.9)
Alkaline Phosphatase: 78 IU/L (ref 39–117)
BUN/Creatinine Ratio: 16 (ref 10–24)
BUN: 10 mg/dL (ref 8–27)
Bilirubin Total: 0.4 mg/dL (ref 0.0–1.2)
CO2: 31 mmol/L — ABNORMAL HIGH (ref 20–29)
Calcium: 9 mg/dL (ref 8.6–10.2)
Chloride: 98 mmol/L (ref 96–106)
Creatinine, Ser: 0.61 mg/dL — ABNORMAL LOW (ref 0.76–1.27)
GFR calc Af Amer: 125 mL/min/{1.73_m2} (ref >59)
GFR calc non Af Amer: 109 mL/min/{1.73_m2} (ref >59)
Globulin, Total: 2.5 g/dL (ref 1.5–4.5)
Glucose: 83 mg/dL (ref 65–99)
Potassium: 4 mmol/L (ref 3.5–5.2)
Sodium: 142 mmol/L (ref 134–144)
Total Protein: 6.5 g/dL (ref 6.0–8.5)

## 2018-10-08 LAB — CBC WITH DIFFERENTIAL/PLATELET
Basophils Absolute: 0.1 10*3/uL (ref 0.0–0.2)
Basos: 1 %
EOS (ABSOLUTE): 1.3 10*3/uL — ABNORMAL HIGH (ref 0.0–0.4)
Eos: 11 %
Hematocrit: 42.8 % (ref 37.5–51.0)
Hemoglobin: 14.6 g/dL (ref 13.0–17.7)
Immature Grans (Abs): 0 10*3/uL (ref 0.0–0.1)
Immature Granulocytes: 0 %
Lymphocytes Absolute: 3.6 10*3/uL — ABNORMAL HIGH (ref 0.7–3.1)
Lymphs: 30 %
MCH: 35 pg — ABNORMAL HIGH (ref 26.6–33.0)
MCHC: 34.1 g/dL (ref 31.5–35.7)
MCV: 103 fL — ABNORMAL HIGH (ref 79–97)
Monocytes Absolute: 0.6 10*3/uL (ref 0.1–0.9)
Monocytes: 5 %
Neutrophils Absolute: 6.5 10*3/uL (ref 1.4–7.0)
Neutrophils: 53 %
Platelets: 235 10*3/uL (ref 150–450)
RBC: 4.17 x10E6/uL (ref 4.14–5.80)
RDW: 13.4 % (ref 11.6–15.4)
WBC: 12.2 10*3/uL — ABNORMAL HIGH (ref 3.4–10.8)

## 2018-10-08 LAB — LIPID PANEL
Chol/HDL Ratio: 2.7 ratio (ref 0.0–5.0)
Cholesterol, Total: 99 mg/dL — ABNORMAL LOW (ref 100–199)
HDL: 37 mg/dL — ABNORMAL LOW (ref >39)
LDL Calculated: 38 mg/dL (ref 0–99)
Triglycerides: 120 mg/dL (ref 0–149)
VLDL Cholesterol Cal: 24 mg/dL (ref 5–40)

## 2018-10-09 ENCOUNTER — Encounter: Payer: Self-pay | Admitting: Family Medicine

## 2018-10-12 ENCOUNTER — Other Ambulatory Visit: Payer: Self-pay | Admitting: *Deleted

## 2018-10-12 DIAGNOSIS — D7589 Other specified diseases of blood and blood-forming organs: Secondary | ICD-10-CM

## 2018-10-14 ENCOUNTER — Ambulatory Visit: Payer: Self-pay | Admitting: *Deleted

## 2018-10-14 DIAGNOSIS — F411 Generalized anxiety disorder: Secondary | ICD-10-CM

## 2018-10-14 DIAGNOSIS — D7589 Other specified diseases of blood and blood-forming organs: Secondary | ICD-10-CM

## 2018-10-14 NOTE — Patient Instructions (Signed)
Visit Information  Goals Addressed            This Visit's Progress     Patient Stated   . "I want to make sure my bloodwork is ok" (pt-stated)        Current Barriers:  . Cognitive Deficits  Nurse Case Manager Clinical Goal(s):  [name] Over the next 7 days, patient will verbalize understanding of plan for follow up on macrocytosis . Over the next 7 days, patient will work with Consulting civil engineer to address needs related to macrocytosis  Interventions:  . Advised patient to come in to the office next week for blood draw to check his B12 and Folate levels  Patient Self Care Activities:  . Currently UNABLE TO independently perform all ADLs. He manages most of them on his own but he does have an aid that comes in three days a week   Initial goal documentation        The patient verbalized understanding of instructions provided today and declined a print copy of patient instruction materials.   The care management team will reach out to the patient again over the next 7 days.    [name], RN-BC, BSN Nurse Care Manager [facility] [phone]

## 2018-10-14 NOTE — Chronic Care Management (AMB) (Signed)
  Chronic Care Management   Follow Up Note   10/14/2018 Name: [name] MRN: [id number] DOB: 14-Apr-1956  Referred by: [name], MD Reason for referral : Chronic Care Management (RN follow up)   [name] is a 63 y.o. year old male who is a primary care patient of [name], [name], MD. The CCM team was consulted for assistance with chronic disease management and care coordination needs.    Review of patient status, including review of consultants reports, relevant laboratory and other test results, and collaboration with appropriate care team members and the patient's provider was performed as part of comprehensive patient evaluation and provision of chronic care management services.    Goals Addressed      Patient Stated   . "I want to make sure my bloodwork is ok" (pt-stated)        Current Barriers:  . Cognitive Deficits  Nurse Case Manager Clinical Goal(s):  [name] Over the next 7 days, patient will verbalize understanding of plan for follow up on macrocytosis . Over the next 7 days, patient will work with Consulting civil engineer to address needs related to macrocytosis  Interventions:   Advised patient to come in to the office next week for blood draw to check his B12 and Folate levels (orders are in [facility])  Explained macrocytosis and provided reassurance in response to his concern that this may indicate cancer. Typically asymptomatic and just an incidental finding. Not likely r/t to cancer but Dr Livia Snellen has ordered additional tests to see if he can pinpoint the cause.    Patient Self Care Activities:  . Currently UNABLE TO independently perform all ADLs. He manages most of them on his own but he does have an aid that comes in three days a week   Initial goal documentation        Follow Up Plan The care management team will reach out to the patient again over the next 7 days.   The care management team will review lab results and recommendations with patient.     [name], RN-BC, BSN Nurse Care Manager [facility] [phone]

## 2018-10-15 LAB — FOLATE: Folate: 4.1 ng/mL (ref >3.0)

## 2018-10-15 LAB — VITAMIN B12: Vitamin B-12: 155 pg/mL — ABNORMAL LOW (ref 232–1245)

## 2018-10-15 LAB — SPECIMEN STATUS REPORT

## 2018-10-17 ENCOUNTER — Other Ambulatory Visit: Payer: Self-pay | Admitting: Family Medicine

## 2018-10-17 ENCOUNTER — Other Ambulatory Visit: Payer: Self-pay | Admitting: *Deleted

## 2018-10-17 DIAGNOSIS — E538 Deficiency of other specified B group vitamins: Secondary | ICD-10-CM

## 2018-10-17 MED ORDER — CYANOCOBALAMIN 1000 MCG/ML IJ SOLN
1000.0000 ug | INTRAMUSCULAR | Status: AC
  Administered 2018-11-11 – 2019-10-02 (×11): 1000 ug via INTRAMUSCULAR

## 2018-10-17 MED ORDER — CYANOCOBALAMIN 1000 MCG/ML IJ SOLN
1000.0000 ug | INTRAMUSCULAR | Status: AC
  Administered 2018-10-18 – 2018-11-01 (×3): 1000 ug via INTRAMUSCULAR

## 2018-10-17 MED ORDER — CYANOCOBALAMIN 1000 MCG/ML IJ SOLN: INTRAMUSCULAR | 0 refills | Status: DC

## 2018-10-18 ENCOUNTER — Other Ambulatory Visit: Payer: Self-pay

## 2018-10-18 ENCOUNTER — Ambulatory Visit (INDEPENDENT_AMBULATORY_CARE_PROVIDER_SITE_OTHER): Payer: Medicare Other | Admitting: *Deleted

## 2018-10-18 DIAGNOSIS — E538 Deficiency of other specified B group vitamins: Secondary | ICD-10-CM

## 2018-10-18 NOTE — Progress Notes (Signed)
Pt given Cyanocobalamin inj Tolerated well 

## 2018-10-20 ENCOUNTER — Other Ambulatory Visit: Payer: Self-pay | Admitting: Family Medicine

## 2018-10-20 ENCOUNTER — Ambulatory Visit: Payer: Self-pay | Admitting: Licensed Clinical Social Worker

## 2018-10-20 DIAGNOSIS — F411 Generalized anxiety disorder: Secondary | ICD-10-CM

## 2018-10-20 DIAGNOSIS — I25118 Atherosclerotic heart disease of native coronary artery with other forms of angina pectoris: Secondary | ICD-10-CM

## 2018-10-20 DIAGNOSIS — R482 Apraxia: Secondary | ICD-10-CM

## 2018-10-20 DIAGNOSIS — I252 Old myocardial infarction: Secondary | ICD-10-CM

## 2018-10-20 DIAGNOSIS — E785 Hyperlipidemia, unspecified: Secondary | ICD-10-CM

## 2018-10-20 DIAGNOSIS — G809 Cerebral palsy, unspecified: Secondary | ICD-10-CM

## 2018-10-20 DIAGNOSIS — Z550 Illiteracy and low-level literacy: Secondary | ICD-10-CM

## 2018-10-20 DIAGNOSIS — I209 Angina pectoris, unspecified: Secondary | ICD-10-CM
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
## 2018-10-25 ENCOUNTER — Other Ambulatory Visit: Payer: Self-pay

## 2018-10-25 ENCOUNTER — Telehealth: Payer: Self-pay | Admitting: Family Medicine

## 2018-10-25 ENCOUNTER — Ambulatory Visit (INDEPENDENT_AMBULATORY_CARE_PROVIDER_SITE_OTHER): Payer: Medicare Other | Admitting: *Deleted

## 2018-10-25 ENCOUNTER — Ambulatory Visit: Payer: Self-pay | Admitting: Licensed Clinical Social Worker

## 2018-10-25 DIAGNOSIS — G809 Cerebral palsy, unspecified: Secondary | ICD-10-CM

## 2018-10-25 DIAGNOSIS — I25118 Atherosclerotic heart disease of native coronary artery with other forms of angina pectoris: Secondary | ICD-10-CM

## 2018-10-25 DIAGNOSIS — E538 Deficiency of other specified B group vitamins: Secondary | ICD-10-CM

## 2018-10-25 DIAGNOSIS — R482 Apraxia: Secondary | ICD-10-CM

## 2018-10-25 DIAGNOSIS — F411 Generalized anxiety disorder: Secondary | ICD-10-CM

## 2018-10-25 DIAGNOSIS — I252 Old myocardial infarction: Secondary | ICD-10-CM

## 2018-10-25 DIAGNOSIS — E785 Hyperlipidemia, unspecified: Secondary | ICD-10-CM

## 2018-10-25 DIAGNOSIS — I209 Angina pectoris, unspecified: Secondary | ICD-10-CM

## 2018-10-25 DIAGNOSIS — Z550 Illiteracy and low-level literacy: Secondary | ICD-10-CM
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
## 2018-10-25 NOTE — Progress Notes (Signed)
Pt given Cyanocobalamin inj Tolerated well 

## 2018-10-25 NOTE — Chronic Care Management (AMB) (Signed)
  Care Management Note   [name] is a 63 y.o. year old male who is a primary care patient of [name], [name], MD. The CM team was consulted for assistance with chronic disease management and care coordination.   I reached out to [place] by phone today.   Review of patient status, including review of consultants reports, relevant laboratory and other test results, and collaboration with appropriate care team members and the patient's provider was performed as part of comprehensive patient evaluation and provision of chronic care management services.   Social Determinants of Health :Risk of tobacco exposure; risk of social isolation    Chronic Care Management from 04/11/2018 in [place]  PHQ-9 Total Score  5     [name] and client have spoken frequently about social work needs of client. Client has been calling [name] 2-3 times weekly in recently weeks. Client seems concerned over his beginning series of B12 injections. [name] has encouraged client to talk with nurse at [facility] when he receives scheduled B12 injections. Client has been asking [name] questions that should be directed to nursing staff at practice.  [name] has informed RNCM [name] of client questions and [name] has also been talking with [name] about his nursing questions. Client told [name] recently that two of his sisters have been hospitalized. Thus, client may be feeling more anxious at present. His sisters were his main source of family help. Client has prescribed medications and is taking medications as prescribed. He no longer has any legal issues of concern at present and is glad his legal issues have been resolved.  He also has been asking CCM staff about cancer possibility for client. It is possible that one of his parents may have had cancer and he is fearful of a cancer diagnosis. He has been asking if recent blood tests show any evidence of cancer. Client has phone number of RNCM and [name] to  call as needed.  Follow Up Plan: [name] to call client as scheduled to assess client needs at that time.  [name].Sheamus Hasting MSW, [name] Licensed Clinical Social Worker [facility] Care Management [phone]

## 2018-10-25 NOTE — Patient Instructions (Addendum)
Licensed Clinical Social Worker Visit Information  Materials Provided:  No    [name] and client have spoken frequently about social work needs of client. Client has been calling [name] 2-3 times weekly in recently weeks. Client seems concerned over his beginning series of B12 injections. [name] has encouraged client to talk with nurse at [facility] when he receives scheduled B12 injections. Client has been asking [name] questions that should be directed to nursing staff at practice.  [name] has informed RNCM [name] of client questions and [name] has also been talking with [name] about his nursing questions. Client told [name] recently that two of his sisters have been hospitalized. Thus, client may be feeling more anxious at present. His sisters were his main source of family help. Client has prescribed medications and is taking medications as prescribed. He no longer has any legal issues of concern at present and is glad his legal issues have been resolved.  He also has been asking CCM staff about cancer possibility for client. It is possible that one of his parents may have had cancer and he is fearful of a cancer diagnosis. He has been asking if recent blood tests show any evidence of cancer. Client has phone number of RNCM and [name] to call as needed.  Follow Up Plan: [name] to call client as scheduled to assess client needs at that time  The patient verbalized understanding of instructions provided today and declined a print copy of patient instruction materials.   [name].La Shehan MSW, [name] Licensed Clinical Social Worker [facility] Care Management [phone]

## 2018-10-26 ENCOUNTER — Ambulatory Visit: Payer: Self-pay | Admitting: Licensed Clinical Social Worker

## 2018-10-26 ENCOUNTER — Ambulatory Visit: Payer: Self-pay | Admitting: Physician Assistant

## 2018-10-26 ENCOUNTER — Ambulatory Visit: Payer: Medicare Other | Admitting: *Deleted

## 2018-10-26 DIAGNOSIS — Z550 Illiteracy and low-level literacy: Secondary | ICD-10-CM

## 2018-10-26 DIAGNOSIS — G809 Cerebral palsy, unspecified: Secondary | ICD-10-CM

## 2018-10-26 DIAGNOSIS — I252 Old myocardial infarction: Secondary | ICD-10-CM

## 2018-10-26 DIAGNOSIS — E785 Hyperlipidemia, unspecified: Secondary | ICD-10-CM

## 2018-10-26 DIAGNOSIS — E538 Deficiency of other specified B group vitamins: Secondary | ICD-10-CM

## 2018-10-26 DIAGNOSIS — I25118 Atherosclerotic heart disease of native coronary artery with other forms of angina pectoris: Secondary | ICD-10-CM

## 2018-10-26 DIAGNOSIS — F411 Generalized anxiety disorder: Secondary | ICD-10-CM

## 2018-10-26 DIAGNOSIS — R482 Apraxia: Secondary | ICD-10-CM

## 2018-10-26 DIAGNOSIS — I209 Angina pectoris, unspecified: Secondary | ICD-10-CM

## 2018-10-26 NOTE — Telephone Encounter (Signed)
The patient did not show for his appointment.
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
## 2018-10-28 ENCOUNTER — Ambulatory Visit: Payer: Medicare Other | Admitting: *Deleted

## 2018-10-28 ENCOUNTER — Telehealth: Payer: Medicare Other

## 2018-10-28 DIAGNOSIS — F411 Generalized anxiety disorder: Secondary | ICD-10-CM

## 2018-10-28 DIAGNOSIS — G809 Cerebral palsy, unspecified: Secondary | ICD-10-CM

## 2018-10-28 DIAGNOSIS — Z550 Illiteracy and low-level literacy: Secondary | ICD-10-CM
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
## 2018-10-28 NOTE — Patient Instructions (Signed)
Visit Information  Goals Addressed            This Visit's Progress     Patient Stated   . "I don't want to get hooked on pain medicine" (pt-stated)            . Care Manager Goal: Patient will begin to identify acute care needs vs chronic care needs and will start to identify appropriate first steps in treatment. (pt-stated)       Current Barriers:  . Financial Constraints.  . Literacy barriers . Transportation barriers . Cognitive Deficits  Nurse Case Manager Clinical Goal(s):  [name] Over the next 60 days, patient will work with Consulting civil engineer to address needs related to acute and chronic medical conditions.  . Over the next 7 days, patient will attend all scheduled medical appointments: Dr Livia Snellen, 10/31/18 . Over the next 14 days, patient will experience relief from wrist pain and will verbalize understanding of acute pain management strategies  Interventions:  . Provided praise for arranging [name] transportation to his upcoming appointment on his own . Discussed with [name], [name] who did verify with [name] that Mr Inclan is scheduled for pickup at 2:00 on 7/13  Patient Self Care Activities:  . Currently UNABLE TO independently drive  Please see past updates related to this goal by clicking on the "Past Updates" button in the selected goal        Follow up: Keep app with Dr Livia Snellen on Tues 7/13 Care management team to follow up over the next 7 days  The patient verbalized understanding of instructions provided today and declined a print copy of patient instruction materials.    [name], RN-BC, BSN Nurse Care Manager [facility] [phone]

## 2018-10-31 ENCOUNTER — Other Ambulatory Visit: Payer: Self-pay

## 2018-10-31 ENCOUNTER — Telehealth: Payer: Self-pay | Admitting: Family Medicine

## 2018-11-01 ENCOUNTER — Encounter: Payer: Self-pay | Admitting: Family Medicine

## 2018-11-01 ENCOUNTER — Ambulatory Visit: Payer: Medicare Other | Admitting: Family Medicine

## 2018-11-01 ENCOUNTER — Ambulatory Visit: Payer: Medicare Other

## 2018-11-01 ENCOUNTER — Ambulatory Visit (INDEPENDENT_AMBULATORY_CARE_PROVIDER_SITE_OTHER): Payer: Medicare Other | Admitting: Family Medicine

## 2018-11-01 ENCOUNTER — Ambulatory Visit (INDEPENDENT_AMBULATORY_CARE_PROVIDER_SITE_OTHER): Payer: Medicare Other | Admitting: *Deleted

## 2018-11-01 ENCOUNTER — Ambulatory Visit: Payer: Self-pay | Admitting: Licensed Clinical Social Worker

## 2018-11-01 VITALS — BP 139/85 | HR 93 | Temp 97.8°F | Ht 73.0 in | Wt 223.6 lb

## 2018-11-01 DIAGNOSIS — E538 Deficiency of other specified B group vitamins: Secondary | ICD-10-CM

## 2018-11-01 DIAGNOSIS — S60211A Contusion of right wrist, initial encounter: Secondary | ICD-10-CM | POA: Diagnosis not present

## 2018-11-01 DIAGNOSIS — R482 Apraxia: Secondary | ICD-10-CM

## 2018-11-01 DIAGNOSIS — S300XXA Contusion of lower back and pelvis, initial encounter: Secondary | ICD-10-CM

## 2018-11-01 DIAGNOSIS — S7011XA Contusion of right thigh, initial encounter: Secondary | ICD-10-CM

## 2018-11-01 DIAGNOSIS — Z550 Illiteracy and low-level literacy: Secondary | ICD-10-CM

## 2018-11-01 DIAGNOSIS — I25118 Atherosclerotic heart disease of native coronary artery with other forms of angina pectoris: Secondary | ICD-10-CM

## 2018-11-01 DIAGNOSIS — I209 Angina pectoris, unspecified: Secondary | ICD-10-CM

## 2018-11-01 DIAGNOSIS — G809 Cerebral palsy, unspecified: Secondary | ICD-10-CM

## 2018-11-01 DIAGNOSIS — I252 Old myocardial infarction: Secondary | ICD-10-CM

## 2018-11-01 DIAGNOSIS — F411 Generalized anxiety disorder: Secondary | ICD-10-CM

## 2018-11-01 MED ORDER — TRAMADOL HCL 50 MG PO TABS: 50.0000 mg | ORAL_TABLET | Freq: Four times a day (QID) | ORAL | 0 refills | Status: AC

## 2018-11-01 NOTE — Progress Notes (Signed)
Pt given Cyanocobalamin inj Tolerated well 
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
## 2018-11-02 ENCOUNTER — Other Ambulatory Visit: Payer: Self-pay

## 2018-11-02 NOTE — Patient Outreach (Signed)
[facility]) Care Management  11/02/2018  [name] 1955/06/14 [id number]   Transportation arranged via [place] for appointment with Dr. [name] on 11/03/18.  Attempted to call patient x2 to confirm pick up time but he did not answer.  Left voicemail message.   [name], BSW Social Worker [phone]

## 2018-11-03 ENCOUNTER — Ambulatory Visit (INDEPENDENT_AMBULATORY_CARE_PROVIDER_SITE_OTHER): Payer: Medicare Other

## 2018-11-03 ENCOUNTER — Other Ambulatory Visit: Payer: Medicare Other

## 2018-11-03 DIAGNOSIS — M545 Low back pain: Secondary | ICD-10-CM | POA: Diagnosis not present

## 2018-11-03 DIAGNOSIS — S6991XA Unspecified injury of right wrist, hand and finger(s), initial encounter: Secondary | ICD-10-CM | POA: Diagnosis not present

## 2018-11-03 DIAGNOSIS — S7011XA Contusion of right thigh, initial encounter: Secondary | ICD-10-CM | POA: Diagnosis not present

## 2018-11-03 DIAGNOSIS — M25531 Pain in right wrist: Secondary | ICD-10-CM | POA: Diagnosis not present

## 2018-11-03 DIAGNOSIS — S300XXA Contusion of lower back and pelvis, initial encounter: Secondary | ICD-10-CM

## 2018-11-03 DIAGNOSIS — S79921A Unspecified injury of right thigh, initial encounter: Secondary | ICD-10-CM | POA: Diagnosis not present

## 2018-11-03 DIAGNOSIS — M25552 Pain in left hip: Secondary | ICD-10-CM | POA: Diagnosis not present

## 2018-11-03 DIAGNOSIS — M79651 Pain in right thigh: Secondary | ICD-10-CM | POA: Diagnosis not present

## 2018-11-03 DIAGNOSIS — S60211A Contusion of right wrist, initial encounter: Secondary | ICD-10-CM

## 2018-11-04 ENCOUNTER — Telehealth: Payer: Self-pay | Admitting: Family Medicine

## 2018-11-04 NOTE — Telephone Encounter (Signed)
No fractures.  Degenerative arthritis in the back noted though.  Follow up with Dr Livia Snellen for ongoing care.

## 2018-11-04 NOTE — Telephone Encounter (Signed)
Please review all x-rays and advise on results. Thanks.

## 2018-11-04 NOTE — Telephone Encounter (Signed)
Aware of all results.

## 2018-11-07 ENCOUNTER — Telehealth: Payer: Self-pay | Admitting: Family Medicine

## 2018-11-07 ENCOUNTER — Other Ambulatory Visit: Payer: Self-pay

## 2018-11-07 NOTE — Patient Outreach (Signed)
[facility]) Care Management  11/07/2018  Vincen Bejar [place] [birth year] [id number]   Transportation arranged via [place] for appointment at [facility] on 11/11/18.  [name], BSW Social Worker [phone]

## 2018-11-07 NOTE — Telephone Encounter (Signed)
Patient aware to call [facility] and schedule pick up

## 2018-11-08 ENCOUNTER — Ambulatory Visit: Payer: Self-pay | Admitting: Licensed Clinical Social Worker

## 2018-11-08 DIAGNOSIS — E538 Deficiency of other specified B group vitamins: Secondary | ICD-10-CM

## 2018-11-08 DIAGNOSIS — R482 Apraxia: Secondary | ICD-10-CM

## 2018-11-08 DIAGNOSIS — F411 Generalized anxiety disorder: Secondary | ICD-10-CM

## 2018-11-08 DIAGNOSIS — Z550 Illiteracy and low-level literacy: Secondary | ICD-10-CM

## 2018-11-08 DIAGNOSIS — R0602 Shortness of breath: Secondary | ICD-10-CM

## 2018-11-08 NOTE — Chronic Care Management (AMB) (Signed)
  Care Management Note   [name] is a 63 y.o. year old male who is a primary care patient of [name], [name], MD. The CM team was consulted for assistance with chronic disease management and care coordination.   I reached out to [place] by phone today.   Review of patient status, including review of consultants reports, relevant laboratory and other test results, and collaboration with appropriate care team members and the patient's provider was performed as part of comprehensive patient evaluation and provision of chronic care management services.   Social Determinants of Health: risk of tobacco use; risk of social isolation.    Chronic Care Management from 04/11/2018 in [place]  PHQ-9 Total Score  5     [name] spoke with client about client needs. Client said he needed ride set up to take him to and from [facility] on Friday of this week for B12 shot. [name] contacted [employer], BSW regarding client transport needs. Amber Chrismon contacted [place] and [employer] made transport arrangements with [name] to transport client to and from [facility] on this Friday for B12 shot for client. Amber notified client of transport pick up time for client for this Friday. [name] also helped client look up telephone number for insurance company client wished to contact.  Follow Up Plan: [name] to call client as scheduled to assess client needs  Eduardo Honor.Paisly Fingerhut MSW, [name] Licensed Clinical Social Worker [facility] Care Management [phone]

## 2018-11-08 NOTE — Patient Instructions (Addendum)
Licensed Clinical Social Worker Visit Information    [name] spoke with client about client needs. Client said he needed ride set up to take him to and from [facility] on Friday of this week for B12 shot. [name] contacted [employer], BSW regarding client transport needs. Amber Chrismon contacted [place] and [employer] made transport arrangements with [name] to transport client to and from [facility] on this Friday for B12 shot for client. Amber notified client of transport pick up time for client for this Friday. [name] also helped client look up telephone number for insurance company client wished to contact.  Materials Provided: No  Follow Up Plan: [name] to call client as scheduled to assess client needs at that time  The patient verbalized understanding of instructions provided today and declined a print copy of patient instruction materials.   [name].Tijuan Dantes MSW, [name] Licensed Clinical Social Worker [facility] Care Management [phone]

## 2018-11-10 ENCOUNTER — Ambulatory Visit (INDEPENDENT_AMBULATORY_CARE_PROVIDER_SITE_OTHER): Payer: Medicare Other | Admitting: *Deleted

## 2018-11-10 ENCOUNTER — Other Ambulatory Visit: Payer: Self-pay

## 2018-11-10 DIAGNOSIS — I252 Old myocardial infarction: Secondary | ICD-10-CM | POA: Diagnosis not present

## 2018-11-10 DIAGNOSIS — I10 Essential (primary) hypertension: Secondary | ICD-10-CM

## 2018-11-10 DIAGNOSIS — Z550 Illiteracy and low-level literacy: Secondary | ICD-10-CM

## 2018-11-10 DIAGNOSIS — F329 Major depressive disorder, single episode, unspecified: Secondary | ICD-10-CM

## 2018-11-10 DIAGNOSIS — G809 Cerebral palsy, unspecified: Secondary | ICD-10-CM

## 2018-11-10 DIAGNOSIS — F32A Depression, unspecified: Secondary | ICD-10-CM

## 2018-11-11 ENCOUNTER — Ambulatory Visit (INDEPENDENT_AMBULATORY_CARE_PROVIDER_SITE_OTHER): Payer: Medicare Other | Admitting: *Deleted

## 2018-11-11 ENCOUNTER — Ambulatory Visit: Payer: Self-pay | Admitting: Licensed Clinical Social Worker

## 2018-11-11 DIAGNOSIS — I209 Angina pectoris, unspecified: Secondary | ICD-10-CM

## 2018-11-11 DIAGNOSIS — F411 Generalized anxiety disorder: Secondary | ICD-10-CM

## 2018-11-11 DIAGNOSIS — E538 Deficiency of other specified B group vitamins: Secondary | ICD-10-CM | POA: Diagnosis not present

## 2018-11-11 DIAGNOSIS — G809 Cerebral palsy, unspecified: Secondary | ICD-10-CM

## 2018-11-11 DIAGNOSIS — R482 Apraxia: Secondary | ICD-10-CM

## 2018-11-11 DIAGNOSIS — Z550 Illiteracy and low-level literacy: Secondary | ICD-10-CM

## 2018-11-11 DIAGNOSIS — I25118 Atherosclerotic heart disease of native coronary artery with other forms of angina pectoris: Secondary | ICD-10-CM
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
## 2018-11-11 NOTE — Progress Notes (Signed)
Pt given Cyanocobalamin inj Tolerated well 

## 2018-11-14 ENCOUNTER — Other Ambulatory Visit: Payer: Self-pay

## 2018-11-14 NOTE — Patient Outreach (Signed)
[name] [facility]) Care Management  11/14/2018  [name] [birth year] [id number]   Transportation arranged via [name] to appointment at [place] on 11/18/18.  Contacted patient today to confirm pick up time for 10:00a.m.    [name], BSW Social Worker [phone]

## 2018-11-17 ENCOUNTER — Other Ambulatory Visit: Payer: Self-pay

## 2018-11-17 ENCOUNTER — Ambulatory Visit: Payer: Self-pay | Admitting: Licensed Clinical Social Worker

## 2018-11-17 DIAGNOSIS — I25118 Atherosclerotic heart disease of native coronary artery with other forms of angina pectoris: Secondary | ICD-10-CM

## 2018-11-17 DIAGNOSIS — I209 Angina pectoris, unspecified: Secondary | ICD-10-CM

## 2018-11-17 DIAGNOSIS — Z550 Illiteracy and low-level literacy: Secondary | ICD-10-CM

## 2018-11-17 DIAGNOSIS — I252 Old myocardial infarction: Secondary | ICD-10-CM

## 2018-11-17 DIAGNOSIS — F411 Generalized anxiety disorder: Secondary | ICD-10-CM

## 2018-11-17 DIAGNOSIS — E785 Hyperlipidemia, unspecified: Secondary | ICD-10-CM

## 2018-11-17 DIAGNOSIS — E538 Deficiency of other specified B group vitamins: Secondary | ICD-10-CM

## 2018-11-17 DIAGNOSIS — G809 Cerebral palsy, unspecified: Secondary | ICD-10-CM

## 2018-11-17 DIAGNOSIS — R482 Apraxia: Secondary | ICD-10-CM
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
## 2018-11-18 ENCOUNTER — Ambulatory Visit: Payer: Self-pay | Admitting: Licensed Clinical Social Worker

## 2018-11-18 ENCOUNTER — Other Ambulatory Visit: Payer: Self-pay | Admitting: Family Medicine

## 2018-11-18 ENCOUNTER — Ambulatory Visit (INDEPENDENT_AMBULATORY_CARE_PROVIDER_SITE_OTHER): Payer: Medicare Other | Admitting: *Deleted

## 2018-11-18 DIAGNOSIS — E538 Deficiency of other specified B group vitamins: Secondary | ICD-10-CM

## 2018-11-18 DIAGNOSIS — I209 Angina pectoris, unspecified: Secondary | ICD-10-CM

## 2018-11-18 DIAGNOSIS — E785 Hyperlipidemia, unspecified: Secondary | ICD-10-CM

## 2018-11-18 DIAGNOSIS — R482 Apraxia: Secondary | ICD-10-CM

## 2018-11-18 DIAGNOSIS — I252 Old myocardial infarction: Secondary | ICD-10-CM

## 2018-11-18 DIAGNOSIS — Z550 Illiteracy and low-level literacy: Secondary | ICD-10-CM

## 2018-11-18 DIAGNOSIS — G809 Cerebral palsy, unspecified: Secondary | ICD-10-CM

## 2018-11-18 DIAGNOSIS — F411 Generalized anxiety disorder: Secondary | ICD-10-CM

## 2018-11-18 DIAGNOSIS — I25118 Atherosclerotic heart disease of native coronary artery with other forms of angina pectoris: Secondary | ICD-10-CM

## 2018-11-18 MED ORDER — CYANOCOBALAMIN 1000 MCG/ML IJ SOLN
1000.0000 ug | Freq: Once | INTRAMUSCULAR | Status: AC
  Administered 2018-11-18: 1000 ug via INTRAMUSCULAR

## 2018-11-18 NOTE — Chronic Care Management (AMB) (Signed)
  Care Management Note   [name] is a 63 y.o. year old male who is a primary care patient of [name], [name], MD. The CM team was consulted for assistance with chronic disease management and care coordination.  Review of patient status, including review of consultants reports, relevant laboratory and other test results, and collaboration with appropriate care team members and the patient's provider was performed as part of comprehensive patient evaluation and provision of chronic care management services.   Social Determinants of Health: risk of tobacco use; risk of social isolation    Chronic Care Management from 04/11/2018 in [place]  PHQ-9 Total Score  5     GAD 7 : Generalized Anxiety Score 05/01/2016 03/31/2016  Nervous, Anxious, on Edge 1 3  Control/stop worrying 1 3  Worry too much - different things 1 3  Trouble relaxing 0 2  Restless 0 2  Easily annoyed or irritable 0 0  Afraid - awful might happen 0 0  Total GAD 7 Score 3 13  Anxiety Difficulty Not difficult at all Not difficult at all   [name] and client met on 11/18/2018 at [facility]. Client had come to office and had received B12 injection. [name] asked [name] to help him with changing name of PCP on his [facility] insurance card. [name] called [facility] customer services with [name] present on 11/18/2018. [name] informed [employer] representative that he wanted to remove name of Dr. Redge Gainer as PCP on his [facility] insurance card and wanted to list his PCP on this insurance card as Dr. [name]. Representative understood information and said she would list Dr. [name] as PCP on client's insurance card. She said she would mail client new updated insurance card and he should get updated insurance card in 7-10 business days. [name] and [name] thanked [facility] representative for assistance.  Follow Up Plan: [name] to call client in next 3 weeks to talk with client about social work needs of client  [name].Forrest MSW, [name]  Licensed Clinical Social Worker [facility] Care Management [phone]

## 2018-11-18 NOTE — Patient Instructions (Addendum)
Licensed Clinical Social Worker Visit Information  Materials Provided: No  [name] and client met on 11/18/2018 at [facility]. Client had come to office and had received B12 injection. [name] asked [name] to help him with changing name of PCP on his [facility] insurance card. [name] called [facility] customer services with [place] present on 11/18/2018. Kayvan informed [employer] representative that he wanted to remove name of Dr. Redge Gainer as PCP on his [facility] insurance card and wanted to list his PCP on this insurance card as Dr. [name]. Representative understood information and said she would list Dr. [name] as PCP on client's insurance card. She said she would mail client new updated insurance card and he should get updated insurance card in 7-10 business days. [name] and [name] thanked [facility] representative for assistance.  Follow Up Plan: [name] to call client in next 3 weeks to talk with client about social work needs  The patient verbalized understanding of instructions provided today and declined a print copy of patient instruction materials.   [name].Marshon Bangs MSW, [name] Licensed Clinical Social Worker [facility] Care Management [phone]

## 2018-11-21 ENCOUNTER — Telehealth: Payer: Self-pay

## 2018-11-29 ENCOUNTER — Ambulatory Visit: Payer: Self-pay | Admitting: Licensed Clinical Social Worker

## 2018-11-29 DIAGNOSIS — I25118 Atherosclerotic heart disease of native coronary artery with other forms of angina pectoris: Secondary | ICD-10-CM

## 2018-11-29 DIAGNOSIS — Z550 Illiteracy and low-level literacy: Secondary | ICD-10-CM

## 2018-11-29 DIAGNOSIS — I209 Angina pectoris, unspecified: Secondary | ICD-10-CM

## 2018-11-29 DIAGNOSIS — F411 Generalized anxiety disorder: Secondary | ICD-10-CM

## 2018-11-29 DIAGNOSIS — E538 Deficiency of other specified B group vitamins: Secondary | ICD-10-CM

## 2018-11-29 DIAGNOSIS — G809 Cerebral palsy, unspecified: Secondary | ICD-10-CM

## 2018-11-29 DIAGNOSIS — R482 Apraxia: Secondary | ICD-10-CM

## 2018-11-29 DIAGNOSIS — I252 Old myocardial infarction: Secondary | ICD-10-CM

## 2018-11-29 NOTE — Chronic Care Management (AMB) (Signed)
  Care Management Note   [name] is a 63 y.o. year old male who is a primary care patient of [name], [name], MD. The CM team was consulted for assistance with chronic disease management and care coordination.   I reached out to [place] by phone today.   Review of patient status, including review of consultants reports, relevant laboratory and other test results, and collaboration with appropriate care team members and the patient's provider was performed as part of comprehensive patient evaluation and provision of chronic care management services.   Social Determinants of Health risk of social isolation; risk of tobacco use    Chronic Care Management from 04/11/2018 in [place]  PHQ-9 Total Score  5     GAD 7 : Generalized Anxiety Score 05/01/2016 03/31/2016  Nervous, Anxious, on Edge 1 3  Control/stop worrying 1 3  Worry too much - different things 1 3  Trouble relaxing 0 2  Restless 0 2  Easily annoyed or irritable 0 0  Afraid - awful might happen 0 0  Total GAD 7 Score 3 13  Anxiety Difficulty Not difficult at all Not difficult at all   [name] spoke via phone with client.  [name] and client spoke of client needs. Client said his wrist was sore; he said that his right hand sometimes is numb when he wakes up.  He is walking well.  He said he is eating adequately. Client said he thinks he has an insect bite on his left arm; he said it may be a spider bite but he is not sure what type of insect bite it is.  He said it is red around bite site when a spot in the middle.  [name] informed client that [name] would call RNCM today to talk with [facility] about client needs.  Client agreed to this plan  Follow Up Plan: [name] to call client in next 3 weeks to assess social work needs of client at that time.  [name].Elfida Shimada MSW, [name] Licensed Clinical Social Worker [facility] Care Management [phone]

## 2018-11-29 NOTE — Patient Instructions (Addendum)
Licensed Clinical Social Worker Visit Information   Materials Provided: No  [name] spoke via phone with client.  [name] and client spoke of client needs. Client said his wrist was sore; he said that his right hand sometimes is numb when he wakes up.  He is walking well.  He said he is eating adequately. Client said he thinks he has an insect bite on his left arm; he said it may be a spider bite but he is not sure what type of insect bite it is.  He said it is red around bite site when a spot in the middle.  [name] informed client that [name] would call RNCM today to talk with [facility] about client needs.  Client agreed to this plan  Follow Up Plan: [name] to call client in next 3 weeks to assess social work needs of client  The patient verbalized understanding of instructions provided today and declined a print copy of patient instruction materials.   [name].Jamen Loiseau MSW, [name] Licensed Clinical Social Worker [facility] Care Management [phone]

## 2018-11-30 ENCOUNTER — Other Ambulatory Visit: Payer: Self-pay | Admitting: Family Medicine

## 2018-11-30 ENCOUNTER — Ambulatory Visit: Payer: Medicare Other | Admitting: *Deleted

## 2018-11-30 ENCOUNTER — Ambulatory Visit: Payer: Self-pay | Admitting: Licensed Clinical Social Worker

## 2018-11-30 DIAGNOSIS — R531 Weakness: Secondary | ICD-10-CM

## 2018-11-30 DIAGNOSIS — G809 Cerebral palsy, unspecified: Secondary | ICD-10-CM

## 2018-11-30 DIAGNOSIS — R482 Apraxia: Secondary | ICD-10-CM

## 2018-11-30 DIAGNOSIS — F411 Generalized anxiety disorder: Secondary | ICD-10-CM

## 2018-11-30 DIAGNOSIS — I209 Angina pectoris, unspecified: Secondary | ICD-10-CM

## 2018-11-30 DIAGNOSIS — I25118 Atherosclerotic heart disease of native coronary artery with other forms of angina pectoris: Secondary | ICD-10-CM

## 2018-11-30 DIAGNOSIS — E538 Deficiency of other specified B group vitamins: Secondary | ICD-10-CM

## 2018-11-30 DIAGNOSIS — Z550 Illiteracy and low-level literacy: Secondary | ICD-10-CM

## 2018-11-30 MED ORDER — MUPIROCIN 2 % EX OINT: TOPICAL_OINTMENT | CUTANEOUS | 1 refills | Status: AC

## 2018-11-30 MED ORDER — DAPSONE 100 MG PO TABS: 100.0000 mg | ORAL_TABLET | Freq: Every day | ORAL | 0 refills | Status: DC

## 2018-11-30 NOTE — Patient Instructions (Addendum)
Licensed Clinical Social Worker Visit Information  Client has called [name]  3 times about insect bite issue. [name] has conveyed information to [facility] about client insect bite.  RNCM called client today and talked with client about insect bite issue.Client often calls [name] multiple times during the week when client has an issue on his mind.   Materials Provided: No  Follow Up Plan: [name] to call client in next 3 weeks to assess the psychosocial needs of client   The patient verbalized understanding of instructions provided today and declined a print copy of patient instruction materials.   [name].Tyannah Sane MSW, [name] Licensed Clinical Social Worker [facility] Care Management [phone]

## 2018-11-30 NOTE — Chronic Care Management (AMB) (Signed)
Chronic Care Management   Follow Up Note   11/30/2018 Name: [name] MRN: [id number] DOB: [birth year]  Referred by: [name], MD Reason for referral : Chronic Care Management (RNCM Follow Up on [name] call)   TIERRE NETTO is a 63 y.o. year old male who is a primary care patient of [name], [name], MD. The CCM team has been working with [name] regarding his chronic care needs.   I spoke with [name] by telephone today at the request of [name], [name] who spoke with him yesterday and earlier today. Kristopher complains of a "spider bite" on his "bad arm near where it bends". He recalls feeling something 3 days ago while working outdoors. He doesn't remember seeing a spider but he said he felt something running down his arm and noticed a clear liquid with blood in it. He has been cleaning the area with peroxide but is worried about the "hole".  Goals Addressed            This Visit's Progress     Patient Stated   . "I need something for this spider bite" (pt-stated)       Current Barriers:  . Chronic Disease Management support and education needs related to wound care . Transportation . Lack of caregiver support  Nurse Case Manager Clinical Goal(s):  [name] Over the next 24 hours, patient will send a picture of the "spider bite" to my company cell phone . Over the next 3 days, patient will follow RNCM wound care advice . Over the next 7 days, patient will see resolution or marked improvement in wound  Interventions:  . Discussed HPI o Red area with a "hole the size of a pencil eraser" on inside of left elbow for 3 days o Noticed it while working outdoors o Clear/bloody drainage o No complaint of pain or itching o Has been cleaning with peroxide . Asked patient to take a picture of the bite and have someone help him send it to me . Alternative is to arrange an appointment for Monday at the earliest due to transportation arrangement requirement of 3 days notice .  Collaborated with Dr Livia Snellen. He will send in bactroban to fight infection and dapsone to prevent potential toxin damage . Advised to d/c peroxide because it is caustic. Clean with soap and water. Pat dry.  Patient Self Care Activities:  . Self administers medications as prescribed . Calls pharmacy for medication refills  Initial goal documentation     . "I want to make sure my aid is coming like she's supposed to" (pt-stated)       Current Barriers:  [name] Knowledge Deficits related to in home aid company . Lacks caregiver support.  . Literacy barriers . Transportation barriers  Nurse Case Manager Clinical Goal(s):  [name] Over the next 7 days, patient will talk with in-home aide employer regarding the number of hours he is supposed to have help  Interventions:  . [employer] at [phone] and left a message requesting a return phone call with the name of the agency that provides in home services to [name].  . Will contact agency once I have that information  Patient Self Care Activities:  . Calls provider office for new concerns or questions  Initial goal documentation         Plan Medication sent to [place] to be delivered to patient's home RNCM will f/u with patient over the next 2 days   [facility] BSN, RN-BC Embedded  Chronic Care Manager [place] [facility] [place] Management Direct Dial: [phone]

## 2018-11-30 NOTE — Chronic Care Management (AMB) (Signed)
  Care Management Note   [name] is a 63 y.o. year old male who is a primary care patient of [name], [name], MD. The CM team was consulted for assistance with chronic disease management and care coordination.   I reached out to [place] by phone today.   Review of patient status, including review of consultants reports, relevant laboratory and other test results, and collaboration with appropriate care team members and the patient's provider was performed as part of comprehensive patient evaluation and provision of chronic care management services.   Client has called [name]  3 times about insect bite issue. [name] has conveyed information to [facility] about client insect bite.  RNCM called client today and talked with client about insect bite issue.Client often calls [name] multiple times during the week when client has an issue on his mind.   Follow Up Plan: [name] to call client in next 3 weeks to assess psychosocial needs of client   [name].Orlo Brickle MSW, [name] Licensed Clinical Social Worker [facility] Care Management [phone]

## 2018-11-30 NOTE — Patient Instructions (Addendum)
Visit Information  Goals Addressed            This Visit's Progress     Patient Stated   . "I need something for this spider bite" (pt-stated)       Current Barriers:  . Chronic Disease Management support and education needs related to wound care . Transportation . Lack of caregiver support  Nurse Case Manager Clinical Goal(s):  [name] Over the next 24 hours, patient will send a picture of the "spider bite" to my company cell phone . Over the next 3 days, patient will follow RNCM wound care advice . Over the next 7 days, patient will see resolution or marked improvement in wound  Interventions:  . Discussed HPI o Red area with a "hole the size of a pencil eraser" on inside of left elbow for 3 days o Noticed it while working outdoors o Clear/bloody drainage o No complaint of pain or itching o Has been cleaning with peroxide . Asked patient to take a picture of the bite and have someone help him send it to me . Alternative is to arrange an appointment for Monday at the earliest due to transportation arrangement requirement of 3 days notice . Collaborated with Dr Livia Snellen. He will send in bactroban to fight infection and dapsone to prevent potential toxin damage . Advised to d/c peroxide because it is caustic. Clean with soap and water. Pat dry.  Patient Self Care Activities:  . Self administers medications as prescribed . Calls pharmacy for medication refills  Initial goal documentation     . "I want to make sure my aid is coming like she's supposed to" (pt-stated)       Current Barriers:  [name] Knowledge Deficits related to in home aid company . Lacks caregiver support.  . Literacy barriers . Transportation barriers  Nurse Case Manager Clinical Goal(s):  [name] Over the next 7 days, patient will talk with in-home aide employer regarding the number of hours he is supposed to have help  Interventions:  . [employer] at [phone] and left a message requesting a return  phone call with the name of the agency that provides in home services to [name].  . Will contact agency once I have that information  Patient Self Care Activities:  . Calls provider office for new concerns or questions  Initial goal documentation         The patient verbalized understanding of instructions provided today and declined a print copy of patient instruction materials.   Plan The care management team will reach out to the patient again over the next 2 days.    [name] BSN, RN-BC Embedded Chronic Care Manager [facility] / [place] Management Direct Dial: [phone]

## 2018-12-02 ENCOUNTER — Ambulatory Visit: Payer: Medicare Other | Admitting: *Deleted

## 2018-12-02 DIAGNOSIS — Z550 Illiteracy and low-level literacy: Secondary | ICD-10-CM

## 2018-12-02 DIAGNOSIS — R531 Weakness: Secondary | ICD-10-CM

## 2018-12-02 DIAGNOSIS — G809 Cerebral palsy, unspecified: Secondary | ICD-10-CM

## 2018-12-02 DIAGNOSIS — F411 Generalized anxiety disorder: Secondary | ICD-10-CM

## 2018-12-02 NOTE — Chronic Care Management (AMB) (Signed)
Chronic Care Management   Follow Up Note   12/02/2018 Name: ZAKIAH BECKERMAN MRN: [id number] DOB: 02/25/1956  Referred by: [name], MD Reason for referral : Chronic Care Management (RNCM follow up)   DUSAN LIPFORD is a 63 y.o. year old male who is a primary care patient of [name], [name], MD. The CCM team was consulted for assistance with chronic disease management and care coordination needs.    Review of patient status, including review of consultants reports, relevant laboratory and other test results, and collaboration with appropriate care team members and the patient's provider was performed as part of comprehensive patient evaluation and provision of chronic care management services.    I talked with [name] by telephone today.   Outpatient Encounter Medications as of 12/02/2018  Medication Sig  . acetaminophen (TYLENOL) 325 MG tablet Take 2 tablets (650 mg total) by mouth every 6 (six) hours as needed for mild pain or headache.  . albuterol (PROAIR HFA) 108 (90 Base) MCG/ACT inhaler 1-2 puffs every 6 hours as needed wheezing or shortness of breath.  [name] aspirin EC 81 MG tablet Take 1 tablet (81 mg total) by mouth daily.  [name] atorvastatin (LIPITOR) 40 MG tablet Take 1 capsule by mouth at bedtime.  [name] buPROPion (WELLBUTRIN SR) 150 MG 12 hr tablet TAKE  (1)  TABLET TWICE A DAY.  . celecoxib (CELEBREX) 200 MG capsule Take 200 mg by mouth daily as needed for pain.  . COMBIVENT RESPIMAT 20-100 MCG/ACT AERS respimat Inhale 1 puff into the lungs every 6 (six) hours  . dapsone 100 MG tablet Take 1 tablet (100 mg total) by mouth daily.  . DULoxetine (CYMBALTA) 30 MG capsule Take 2 capsules (60 mg total) by mouth daily. WITH A FULL STOMACH AT SUPPER TIME  . fexofenadine (ALLEGRA) 180 MG tablet Take 1 tablet (180 mg total) by mouth daily. For allergy symptoms  . fluticasone (FLONASE) 50 MCG/ACT nasal spray Place 2 sprays into both nostrils daily as needed for allergies or rhinitis.  .  fluticasone furoate-vilanterol (BREO ELLIPTA) 100-25 MCG/INH AEPB Inhale 1 puff into the lungs daily.  . isosorbide mononitrate (IMDUR) 30 MG 24 hr tablet Take 1 tablet (30 mg total) by mouth daily.  . metoprolol tartrate (LOPRESSOR) 25 MG tablet Take 1/2 tablet by mouth two times a day and include in pill packs  . mupirocin ointment (BACTROBAN) 2 % Apply and cover with bandage twice daily  . nitroGLYCERIN (NITROSTAT) 0.4 MG SL tablet Place 1 tablet (0.4 mg total) under the tongue every 5 (five) minutes x 3 doses as needed for chest pain.  . pantoprazole (PROTONIX) 40 MG tablet Take 1 tablet (40 mg total) by mouth 2 (two) times daily.  . tamsulosin (FLOMAX) 0.4 MG CAPS capsule TAKE (1) CAPSULE DAILY   Facility-Administered Encounter Medications as of 12/02/2018  Medication  . cyanocobalamin ((VITAMIN B-12)) injection 1,000 mcg    Goals Addressed            This Visit's Progress     Patient Stated   . "I need something for this spider bite" (pt-stated)       Current Barriers:  . Chronic Disease Management support and education needs related to wound care . Transportation . Lack of caregiver support  Nurse Case Manager Clinical Goal(s):  [name] Over the next 24 hours, patient will send a picture of the "spider bite" to my company cell phone . Over the next 3 days, patient will follow RNCM wound care advice .  Over the next 7 days, patient will see resolution or marked improvement in wound  Interventions:  . Discussed HPI o No systemic symptoms o No worsening redness or swelling o Still localized to "bite" area . Confirmed with [name] that they will deliver medicine today. Dapsone had to be ordered.  . Advised patient on how to use medications . Patient will see if pharmacy delivery person can send me a picture of his bite . Reminded to d/c peroxide because it is caustic. Clean with soap and water. Pat dry. . Collaborated with [name], [name] regarding current status  since I will be out of the office for the next week . Kobee Medlen to seek medical care or call [facility] at [phone] if he has any new symptoms or symptoms get worse . Confirmed patient's understanding of this plan  Patient Self Care Activities:  . Self administers medications as prescribed . Calls pharmacy for medication refills  Please see past updates related to this goal by clicking on the "Past Updates" button in the selected goal      . "I want to make sure my aid is coming like she's supposed to" (pt-stated)       Current Barriers:  . Film/video editor.  . Literacy barriers . Cognitive Deficits  Nurse Case Manager Clinical Goal(s):  [name] Over the next 30 days, patient will verify that he is receiving the appropriate amount of in-home service hours  Interventions:  . Talked with patient about his concerns o Feels that his aid is not coming as often or staying as long as they are supposed to . Verified that he signs off on a timesheet for his aid.  o He has very limited literacy and the timesheet may not be accurate . Left a message for [place] to have them call back with information about which agency is providing care for [place] . Plan to conference call with [name] and Janeece Riggers if I do not receive a call back  Patient Self Care Activities:  . Currently UNABLE TO independently perform all ADLs or IADls . Calls office with questions  Initial goal documentation        Plan  The care management team will reach out to the patient again over the next 14 days.    Provided patient with alternate contact information because I will be out of the office next week  [name] BSN, RN-BC [place] / [place]: [phone]

## 2018-12-02 NOTE — Patient Instructions (Signed)
Visit Information  Goals Addressed            This Visit's Progress     Patient Stated   . "I need something for this spider bite" (pt-stated)       Current Barriers:  . Chronic Disease Management support and education needs related to wound care . Transportation . Lack of caregiver support  Nurse Case Manager Clinical Goal(s):  [name] Over the next 24 hours, patient will send a picture of the "spider bite" to my company cell phone . Over the next 3 days, patient will follow RNCM wound care advice . Over the next 7 days, patient will see resolution or marked improvement in wound  Interventions:  . Discussed HPI o No systemic symptoms o No worsening redness or swelling o Still localized to "bite" area . Confirmed with [facility] [facility] that they will deliver medicine today. Dapsone had to be ordered.  . Advised patient on how to use medications . Patient will see if pharmacy delivery person can send me a picture of his bite . Reminded to d/c peroxide because it is caustic. Clean with soap and water. Pat dry. . Collaborated with [name], [name] regarding current status since I will be out of the office for the next week . Cristo Ausburn to seek medical care or call [facility] at [phone] if he has any new symptoms or symptoms get worse . Confirmed patient's understanding of this plan  Patient Self Care Activities:  . Self administers medications as prescribed . Calls pharmacy for medication refills  Please see past updates related to this goal by clicking on the "Past Updates" button in the selected goal      . "I want to make sure my aid is coming like she's supposed to" (pt-stated)       Current Barriers:  . Film/video editor.  . Literacy barriers . Cognitive Deficits  Nurse Case Manager Clinical Goal(s):  [name] Over the next 30 days, patient will verify that he is receiving the appropriate amount of in-home service hours  Interventions:  . Talked with patient about  his concerns o Feels that his aid is not coming as often or staying as long as they are supposed to . Verified that he signs off on a timesheet for his aid.  o He has very limited literacy and the timesheet may not be accurate . Left a message for [place] to have them call back with information about which agency is providing care for [place] . Plan to conference call with [name] and Janeece Riggers if I do not receive a call back  Patient Self Care Activities:  . Currently UNABLE TO independently perform all ADLs or IADls . Calls office with questions  Initial goal documentation        The patient verbalized understanding of instructions provided today and declined a print copy of patient instruction materials.   The care management team will reach out to the patient again over the next 14 days.    [name] BSN, RN-BC Embedded Chronic Care Manager [facility] / [place] Management Direct Dial: [phone]

## 2018-12-06 ENCOUNTER — Other Ambulatory Visit (INDEPENDENT_AMBULATORY_CARE_PROVIDER_SITE_OTHER): Payer: Medicare Other | Admitting: Family Medicine

## 2018-12-06 DIAGNOSIS — I25118 Atherosclerotic heart disease of native coronary artery with other forms of angina pectoris: Secondary | ICD-10-CM | POA: Diagnosis not present

## 2018-12-06 DIAGNOSIS — I10 Essential (primary) hypertension: Secondary | ICD-10-CM

## 2018-12-08 ENCOUNTER — Ambulatory Visit: Payer: Self-pay | Admitting: Licensed Clinical Social Worker

## 2018-12-08 DIAGNOSIS — R482 Apraxia: Secondary | ICD-10-CM

## 2018-12-08 DIAGNOSIS — I25118 Atherosclerotic heart disease of native coronary artery with other forms of angina pectoris: Secondary | ICD-10-CM

## 2018-12-08 DIAGNOSIS — E785 Hyperlipidemia, unspecified: Secondary | ICD-10-CM

## 2018-12-08 DIAGNOSIS — E538 Deficiency of other specified B group vitamins: Secondary | ICD-10-CM

## 2018-12-08 DIAGNOSIS — I209 Angina pectoris, unspecified: Secondary | ICD-10-CM

## 2018-12-08 DIAGNOSIS — G809 Cerebral palsy, unspecified: Secondary | ICD-10-CM

## 2018-12-08 DIAGNOSIS — Z550 Illiteracy and low-level literacy: Secondary | ICD-10-CM

## 2018-12-08 DIAGNOSIS — F411 Generalized anxiety disorder: Secondary | ICD-10-CM
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
## 2018-12-13 ENCOUNTER — Other Ambulatory Visit: Payer: Self-pay

## 2018-12-13 NOTE — Patient Outreach (Signed)
[facility]) Care Management  12/13/2018  Jarrin Paeth [place] [birth year] IJ:[id number]   Transportation arranged via [place] for appointment at [facility] on 12/20/18 @ 9:30.   [name], BSW Social Worker [phone]

## 2018-12-19 ENCOUNTER — Other Ambulatory Visit: Payer: Self-pay

## 2018-12-19 ENCOUNTER — Other Ambulatory Visit: Payer: Self-pay | Admitting: Family Medicine

## 2018-12-20 ENCOUNTER — Ambulatory Visit (INDEPENDENT_AMBULATORY_CARE_PROVIDER_SITE_OTHER): Payer: Medicare Other | Admitting: *Deleted

## 2018-12-20 ENCOUNTER — Ambulatory Visit: Payer: Medicare Other | Admitting: *Deleted

## 2018-12-20 ENCOUNTER — Ambulatory Visit: Payer: Self-pay | Admitting: Licensed Clinical Social Worker

## 2018-12-20 DIAGNOSIS — F411 Generalized anxiety disorder: Secondary | ICD-10-CM

## 2018-12-20 DIAGNOSIS — E538 Deficiency of other specified B group vitamins: Secondary | ICD-10-CM

## 2018-12-20 DIAGNOSIS — I209 Angina pectoris, unspecified: Secondary | ICD-10-CM

## 2018-12-20 DIAGNOSIS — G809 Cerebral palsy, unspecified: Secondary | ICD-10-CM

## 2018-12-20 DIAGNOSIS — I252 Old myocardial infarction: Secondary | ICD-10-CM

## 2018-12-20 DIAGNOSIS — R482 Apraxia: Secondary | ICD-10-CM

## 2018-12-20 DIAGNOSIS — Z550 Illiteracy and low-level literacy: Secondary | ICD-10-CM

## 2018-12-20 DIAGNOSIS — E785 Hyperlipidemia, unspecified: Secondary | ICD-10-CM

## 2018-12-20 DIAGNOSIS — I25118 Atherosclerotic heart disease of native coronary artery with other forms of angina pectoris: Secondary | ICD-10-CM

## 2018-12-20 NOTE — Patient Instructions (Signed)
Visit Information  Goals Addressed            This Visit's Progress     Patient Stated   . "I need something for this spider bite" (pt-stated)       Current Barriers:  . Chronic Disease Management support and education needs related to wound care . Transportation . Lack of caregiver support  Nurse Case Manager Clinical Goal(s):  [name] Patient will be free of insect/spider bites or other skin wounds for the indefinite future  Interventions:  . Discussed with patient that he completed treatment with oral dapsone and topical antibiotic ointment . Discussed current state o Patient reports that the spider bites have healed up . Discussed objective findings with triage nurse who saw him this morning for his B12 injection o States that areas are completely healed . Advised patient to notify his PCP office at [phone] of any recurrence   Patient Self Care Activities:  . Self administers medications as prescribed . Calls pharmacy for medication refills  Please see past updates related to this goal by clicking on the "Past Updates" button in the selected goal      . "I want to keep my medications straight" (pt-stated)       Current Barriers:  [name] Knowledge Deficits related to medication management . Lacks caregiver support.  . Film/video editor.  . Literacy barriers . Transportation barriers . Cognitive Deficits  Nurse Case Manager Clinical Goal(s):  [name] Over the next 90 days, patient will continue to demonstrate appropriate self management of his medical conditions as evidenced by his ability to explain what his medications are for  Interventions:   Discussed current medications with patient  Verified that he is still receiving deliveries from [place] recent inhaler deliveries and clarified what each inhaler is for  Patient Self Care Activities:  . Currently UNABLE TO independently drive. . Attends all scheduled provider appointments with arranged  transportation.   Please see past updates related to this goal by clicking on the "Past Updates" button in the selected goal       . "I want to make sure my aid is coming like she's supposed to" (pt-stated)       Current Barriers:  . Film/video editor.  . Literacy barriers . Cognitive Deficits  Nurse Case Manager Clinical Goal(s):  [name] Over the next 30 days, patient will verify that personal care aid is coming to his home as often as she is supposed to  Interventions:  . Talked with patient about his concerns o Feels that his aid is not coming as often or staying as long as they are supposed to . Patient advised that he spoke with [facility] regarding his aids hours and that he requested a new care provider. He says that they told him they wouldn't be able to switch his care at this time  Patient Self Care Activities:  . Currently UNABLE TO independently perform all ADLs or IADls . Calls office with questions  Please see past updates related to this goal by clicking on the "Past Updates" button in the selected goal         The patient verbalized understanding of instructions provided today and declined a print copy of patient instruction materials.   The care management team will reach out to the patient again over the next 30 days.   [name] BSN, RN-BC Embedded Chronic Care Manager [facility] / [place] Management Direct Dial: [phone]

## 2018-12-20 NOTE — Chronic Care Management (AMB) (Signed)
  Care Management Note   ARAM CADA is a 63 y.o. year old male who is a primary care patient of [name], [name], MD. The CM team was consulted for assistance with chronic disease management and care coordination.   I reached out to [place] by phone today.   Review of patient status, including review of consultants reports, relevant laboratory and other test results, and collaboration with appropriate care team members and the patient's provider was performed as part of comprehensive patient evaluation and provision  Social Determinants of Health: risk of social isolation; risk of tobacco use    Chronic Care Management from 04/11/2018 in [place]  PHQ-9 Total Score  5     GAD 7 : Generalized Anxiety Score 05/01/2016 03/31/2016  Nervous, Anxious, on Edge 1 3  Control/stop worrying 1 3  Worry too much - different things 1 3  Trouble relaxing 0 2  Restless 0 2  Easily annoyed or irritable 0 0  Afraid - awful might happen 0 0  Total GAD 7 Score 3 13  Anxiety Difficulty Not difficult at all Not difficult at all    Medications   Outpatient Medications   acetaminophen (TYLENOL) 325 MG tablet    albuterol (PROAIR HFA) 108 (90 Base) MCG/ACT inhaler    aspirin EC 81 MG tablet    atorvastatin (LIPITOR) 40 MG tablet    buPROPion (WELLBUTRIN SR) 150 MG 12 hr tablet    celecoxib (CELEBREX) 200 MG capsule    COMBIVENT RESPIMAT 20-100 MCG/ACT AERS respimat    dapsone 100 MG tablet    DULoxetine (CYMBALTA) 30 MG capsule    fexofenadine (ALLEGRA) 180 MG tablet    fluticasone (FLONASE) 50 MCG/ACT nasal spray    fluticasone furoate-vilanterol (BREO ELLIPTA) 100-25 MCG/INH AEPB    isosorbide mononitrate (IMDUR) 30 MG 24 hr tablet    metoprolol tartrate (LOPRESSOR) 25 MG tablet    mupirocin ointment (BACTROBAN) 2 %    nitroGLYCERIN (NITROSTAT) 0.4 MG SL tablet    pantoprazole (PROTONIX) 40 MG tablet    tamsulosin (FLOMAX) 0.4 MG CAPS capsule    Clinic-Administered Medications   cyanocobalamin ((VITAMIN B-12)) injection 1,000 mcg    [name] spoke with client via phone regarding client transport challenges.  [name] has talked with [name] about his use of [name] transport services to attend scheduled medical appointments for client. [name] encouraged client to talk with RNCM to discuss medication questions of client. [name] also talked with client about home health services client receives weekly.  Follow Up Plan: [name] to call client in next 3 weeks to talk with client about social work needs of client at that time  Alexsis Maurer.Janelli Welling MSW, [name] Licensed Clinical Social Worker [facility] Care Management [phone]

## 2018-12-20 NOTE — Chronic Care Management (AMB) (Signed)
.  Chronic Care Management   Follow Up Note   12/20/2018 Name: [name] MRN: [id number] DOB: May 05, 1955  Referred by: James Fraise, MD Reason for referral : Chronic Care Management (RNCM follow up)   [name] is a 63 y.o. year old male who is a primary care patient of [name], James Gash, MD. The CCM team was consulted for assistance with chronic disease management and care coordination needs.    Review of patient status, including review of consultants reports, relevant laboratory and other test results, and collaboration with appropriate care team members and the patient's provider was performed as part of comprehensive patient evaluation and provision of chronic care management services.    SDOH (Social Determinants of Health) screening performed today: Social Connections. See Care Plan for related entries.   I spoke with [name] by telephone today regarding medication management and his recent "spider bite".  Outpatient Encounter Medications as of 12/20/2018  Medication Sig  . acetaminophen (TYLENOL) 325 MG tablet Take 2 tablets (650 mg total) by mouth every 6 (six) hours as needed for mild pain or headache.  . albuterol (PROAIR HFA) 108 (90 Base) MCG/ACT inhaler 1-2 puffs every 6 hours as needed wheezing or shortness of breath.  [name] aspirin EC 81 MG tablet Take 1 tablet (81 mg total) by mouth daily.  [name] atorvastatin (LIPITOR) 40 MG tablet Take 1 capsule by mouth at bedtime.  [name] buPROPion (WELLBUTRIN SR) 150 MG 12 hr tablet TAKE  (1)  TABLET TWICE A DAY.  . celecoxib (CELEBREX) 200 MG capsule Take 200 mg by mouth daily as needed for pain.  . COMBIVENT RESPIMAT 20-100 MCG/ACT AERS respimat Inhale 1 puff into the lungs every 6 (six) hours  . dapsone 100 MG tablet Take 1 tablet (100 mg total) by mouth daily.  . DULoxetine (CYMBALTA) 30 MG capsule Take 2 capsules (60 mg total) by mouth daily. WITH A FULL STOMACH AT SUPPER TIME  . fexofenadine (ALLEGRA) 180 MG tablet Take 1 tablet (180  mg total) by mouth daily. For allergy symptoms  . fluticasone (FLONASE) 50 MCG/ACT nasal spray Place 2 sprays into both nostrils daily as needed for allergies or rhinitis.  . fluticasone furoate-vilanterol (BREO ELLIPTA) 100-25 MCG/INH AEPB Inhale 1 puff into the lungs daily.  . isosorbide mononitrate (IMDUR) 30 MG 24 hr tablet Take 1 tablet (30 mg total) by mouth daily.  . metoprolol tartrate (LOPRESSOR) 25 MG tablet Take 1/2 tablet by mouth two times a day and include in pill packs  . mupirocin ointment (BACTROBAN) 2 % Apply and cover with bandage twice daily  . nitroGLYCERIN (NITROSTAT) 0.4 MG SL tablet Place 1 tablet (0.4 mg total) under the tongue every 5 (five) minutes x 3 doses as needed for chest pain.  . pantoprazole (PROTONIX) 40 MG tablet Take 1 tablet (40 mg total) by mouth 2 (two) times daily.  . tamsulosin (FLOMAX) 0.4 MG CAPS capsule TAKE (1) CAPSULE DAILY   Facility-Administered Encounter Medications as of 12/20/2018  Medication  . cyanocobalamin ((VITAMIN B-12)) injection 1,000 mcg     Goals Addressed            This Visit's Progress     Patient Stated   . "I need something for this spider bite" (pt-stated)       Current Barriers:  . Chronic Disease Management support and education needs related to wound care . Transportation . Lack of caregiver support  Nurse Case Manager Clinical Goal(s):  [name] Patient will be  free of insect/spider bites or other skin wounds for the indefinite future  Interventions:  . Discussed with patient that he completed treatment with oral dapsone and topical antibiotic ointment . Discussed current state o Patient reports that the spider bites have healed up . Discussed objective findings with triage nurse who saw him this morning for his B12 injection o States that areas are completely healed . Advised patient to notify his PCP office at [phone] of any recurrence   Patient Self Care Activities:  . Self administers medications as  prescribed . Calls pharmacy for medication refills  Please see past updates related to this goal by clicking on the "Past Updates" button in the selected goal      . "I want to keep my medications straight" (pt-stated)       Current Barriers:  [name] Knowledge Deficits related to medication management . Lacks caregiver support.  . Film/video editor.  . Literacy barriers . Transportation barriers . Cognitive Deficits  Nurse Case Manager Clinical Goal(s):  [name] Over the next 90 days, patient will continue to demonstrate appropriate self management of his medical conditions as evidenced by his ability to explain what his medications are for  Interventions:   Discussed current medications with patient  Verified that he is still receiving deliveries from [facility] recent inhaler deliveries and clarified what each inhaler is for  Patient Self Care Activities:  . Currently UNABLE TO independently drive. . Attends all scheduled provider appointments with arranged transportation.   Please see past updates related to this goal by clicking on the "Past Updates" button in the selected goal       . "I want to make sure my aid is coming like she's supposed to" (pt-stated)       Current Barriers:  . Film/video editor.  . Literacy barriers . Cognitive Deficits  Nurse Case Manager Clinical Goal(s):  [name] Over the next 30 days, patient will verify that personal care aid is coming to his home as often as she is supposed to  Interventions:  . Talked with patient about his concerns o Feels that his aid is not coming as often or staying as long as they are supposed to . Patient advised that he spoke with [facility] regarding his aids hours and that he requested a new care provider. He says that they told him they wouldn't be able to switch his care at this time  Patient Self Care Activities:  . Currently UNABLE TO independently perform all ADLs or IADls . Calls  office with questions  Please see past updates related to this goal by clicking on the "Past Updates" button in the selected goal         Follow Up Plan The care management team will reach out to the patient again over the next 30 days.    [name] BSN, RN-BC Embedded Chronic Care Manager [facility] / [facility] Direct Dial: [phone]

## 2018-12-20 NOTE — Progress Notes (Signed)
Pt given Cyanocobalamin inj Tolerated well 

## 2018-12-20 NOTE — Patient Instructions (Addendum)
Licensed Clinical Social Worker Visit Information  Materials Provided: No  [name] spoke with client via phone regarding client transport challenges.  [name] has talked with [name] about his use of [name] transport services to attend scheduled medical appointments for client. [name] encouraged client to talk with RNCM to discuss medication questions of client. [name] also talked with client about home health services client receives weekly.  Follow Up Plan: [name] to call client in next 3 weeks to talk with client about social work needs of client at that time  The patient verbalized understanding of instructions provided today and declined a print copy of patient instruction materials.   [name].Bradey Luzier MSW, [name] Licensed Clinical Social Worker [facility] Care Management [phone]

## 2018-12-27 ENCOUNTER — Ambulatory Visit (INDEPENDENT_AMBULATORY_CARE_PROVIDER_SITE_OTHER): Payer: Medicare Other | Admitting: Licensed Clinical Social Worker

## 2018-12-27 DIAGNOSIS — Z550 Illiteracy and low-level literacy: Secondary | ICD-10-CM

## 2018-12-27 DIAGNOSIS — F411 Generalized anxiety disorder: Secondary | ICD-10-CM

## 2018-12-27 DIAGNOSIS — G809 Cerebral palsy, unspecified: Secondary | ICD-10-CM

## 2018-12-27 DIAGNOSIS — I25118 Atherosclerotic heart disease of native coronary artery with other forms of angina pectoris: Secondary | ICD-10-CM

## 2018-12-27 DIAGNOSIS — E785 Hyperlipidemia, unspecified: Secondary | ICD-10-CM

## 2018-12-27 DIAGNOSIS — E538 Deficiency of other specified B group vitamins: Secondary | ICD-10-CM

## 2018-12-27 DIAGNOSIS — I209 Angina pectoris, unspecified: Secondary | ICD-10-CM

## 2018-12-27 DIAGNOSIS — I252 Old myocardial infarction: Secondary | ICD-10-CM

## 2018-12-27 DIAGNOSIS — R482 Apraxia: Secondary | ICD-10-CM

## 2018-12-27 NOTE — Chronic Care Management (AMB) (Signed)
  Care Management Note   [name] is a 63 y.o. year old male who is a primary care patient of [name], [name], MD. The CM team was consulted for assistance with chronic disease management and care coordination.   I reached out to [place] by phone today.   Review of patient status, including review of consultants reports, relevant laboratory and other test results, and collaboration with appropriate care team members and the patient's provider was performed as part of comprehensive patient evaluation and provision of chronic care management services.   Social Determinants of Health: risk of social isolation; risk of tobacco use    Chronic Care Management from 04/11/2018 in [place]  PHQ-9 Total Score  5     GAD 7 : Generalized Anxiety Score 05/01/2016 03/31/2016  Nervous, Anxious, on Edge 1 3  Control/stop worrying 1 3  Worry too much - different things 1 3  Trouble relaxing 0 2  Restless 0 2  Easily annoyed or irritable 0 0  Afraid - awful might happen 0 0  Total GAD 7 Score 3 13  Anxiety Difficulty Not difficult at all Not difficult at all   Medications   Outpatient Medications   acetaminophen (TYLENOL) 325 MG tablet    albuterol (PROAIR HFA) 108 (90 Base) MCG/ACT inhaler    aspirin EC 81 MG tablet    atorvastatin (LIPITOR) 40 MG tablet    buPROPion (WELLBUTRIN SR) 150 MG 12 hr tablet    celecoxib (CELEBREX) 200 MG capsule    COMBIVENT RESPIMAT 20-100 MCG/ACT AERS respimat    dapsone 100 MG tablet    DULoxetine (CYMBALTA) 30 MG capsule    fexofenadine (ALLEGRA) 180 MG tablet    fluticasone (FLONASE) 50 MCG/ACT nasal spray    fluticasone furoate-vilanterol (BREO ELLIPTA) 100-25 MCG/INH AEPB    isosorbide mononitrate (IMDUR) 30 MG 24 hr tablet    metoprolol tartrate (LOPRESSOR) 25 MG tablet    mupirocin ointment (BACTROBAN) 2 %    nitroGLYCERIN (NITROSTAT) 0.4 MG SL tablet    pantoprazole (PROTONIX) 40 MG tablet    tamsulosin (FLOMAX)  0.4 MG CAPS capsule   Clinic-Administered Medications   cyanocobalamin ((VITAMIN B-12)) injection 1,000 mcg      [name] spoke via phone with client today regarding client status and needs. Client said he had some difficulty with recent in home aide and he planned to call home health agency to see if agency can switch his home health aide workers to provide another worker to help him weekly as scheduled. Client has prescribed medications and is taking medications as prescribed; He is scheduled for his next B12 injection at [facility] on 01/19/2019.  He said he has occasional numbness in his right hands. This numbness in right hand has been occurring for about 2 weeks per patient.  [name] encouraged client to call [name] as needed for social work support. [name] encouraged [place] to call RNCM as needed to discuss nursing needs of client.   Follow Up Plan: [name] to call client in next 3 weeks to assess social work needs of client at that time.  [name].Ciria Bernardini MSW, [name] Licensed Clinical Social Worker [facility] Care Management [phone]

## 2018-12-27 NOTE — Patient Instructions (Addendum)
Licensed Clinical Social Worker Visit Information  Materials Provided: No      [name] spoke via phone with client today regarding client status and needs. Client said he had some difficulty with recent in home aide and he planned to call home health agency to see if agency can switch his home health aide workers to provide another worker to help him weekly as scheduled. Client has prescribed medications and is taking medications as prescribed; He is scheduled for his next B12 injection at [facility] on 01/19/2019.  He said he has occasional numbness in his right hands. This numbness in right hand has been occurring for about 2 weeks per patient.  [name] encouraged client to call [name] as needed for social work support. [name] encouraged [place] to call RNCM as needed to discuss nursing needs of client.   Follow Up Plan: [name] to call client in next 3 weeks to assess the social work needs of client at that time  The patient verbalized understanding of instructions provided today and declined a print copy of patient instruction materials.   [name].Zev Blue MSW, [name] Licensed Clinical Social Worker  [facility] Care Management [phone]

## 2019-01-05 ENCOUNTER — Ambulatory Visit (INDEPENDENT_AMBULATORY_CARE_PROVIDER_SITE_OTHER): Payer: Medicare Other | Admitting: Family Medicine

## 2019-01-05 DIAGNOSIS — J301 Allergic rhinitis due to pollen: Secondary | ICD-10-CM | POA: Diagnosis not present

## 2019-01-05 DIAGNOSIS — G44229 Chronic tension-type headache, not intractable: Secondary | ICD-10-CM

## 2019-01-07 ENCOUNTER — Encounter: Payer: Self-pay | Admitting: Family Medicine

## 2019-01-07 NOTE — Progress Notes (Signed)
Subjective:    Patient ID: KWAMI WINKELMAN, male    DOB: [birth year], 63 y.o.   MRN: [id number]   HPI: ALDIE SOISSON is a 63 y.o. male presenting for aching in head daily for several days. Denies any focal neuro changes. Pain is a dull ache usually in the temples and frontal area. No fever, chills or cough. Some nasal congestion. Good relief taking Tylenol. Wants to know if tylenol is safe for him.   Depression screen [facility] 2/9 11/01/2018 08/26/2018 08/22/2018 08/18/2018 08/05/2018  Decreased Interest 0 0 0 0 0  Down, Depressed, Hopeless 0 0 0 0 0  PHQ - 2 Score 0 0 0 0 0  Altered sleeping - - - - -  Tired, decreased energy - - - - -  Change in appetite - - - - -  Feeling bad or failure about yourself  - - - - -  Trouble concentrating - - - - -  Moving slowly or fidgety/restless - - - - -  Suicidal thoughts - - - - -  PHQ-9 Score - - - - -  Difficult doing work/chores - - - - -  Some recent data might be hidden     Relevant past medical, surgical, family and social history reviewed and updated as indicated.  Interim medical history since our last visit reviewed. Allergies and medications reviewed and updated.  ROS:  Review of Systems  Constitutional: Negative for fever.  HENT: Positive for congestion and sneezing.   Respiratory: Negative for shortness of breath.   Cardiovascular: Negative for chest pain.  Musculoskeletal: Negative for arthralgias.  Skin: Negative for rash.  Neurological: Positive for headaches. Negative for dizziness, syncope, weakness and numbness.     Social History   Tobacco Use  Smoking Status Former Smoker  . Packs/day: 0.50  . Years: 41.00  . Pack years: 20.50  . Types: Cigarettes  . Quit date: 06/06/2018  . Years since quitting: 0.5  Smokeless Tobacco Never Used  Tobacco Comment   Patient quit on his own in February 2020       Objective:     Wt Readings from Last 3 Encounters:  11/01/18 223 lb 9.6 oz (101.4 kg)  10/07/18 228 lb 9.6 oz  (103.7 kg)  08/22/18 224 lb (101.6 kg)     Exam deferred. Pt. Harboring due to COVID 19. Phone visit performed.   Assessment & Plan:   1. Chronic tension-type headache, not intractable   2. Seasonal allergic rhinitis due to pollen     No orders of the defined types were placed in this encounter.   No orders of the defined types were placed in this encounter.     Diagnoses and all orders for this visit:  Chronic tension-type headache, not intractable  Seasonal allergic rhinitis due to pollen   Discussed OTC remedies including Tylenol, antihistamines Virtual Visit via telephone Note  I discussed the limitations, risks, security and privacy concerns of performing an evaluation and management service by telephone and the availability of in person appointments. The patient was identified with two identifiers. Pt.expressed understanding and agreed to proceed. Pt. Is at home. Dr. Livia Snellen is in his office.  Follow Up Instructions:   I discussed the assessment and treatment plan with the patient. The patient was provided an opportunity to ask questions and all were answered. The patient agreed with the plan and demonstrated an understanding of the instructions.   The patient was advised to call back or seek  an in-person evaluation if the symptoms worsen or if the condition fails to improve as anticipated.   Total minutes including chart review and phone contact time: 16   Follow up plan: Return if symptoms worsen or fail to improve.  [name], MD [place]

## 2019-01-12 ENCOUNTER — Ambulatory Visit: Payer: Medicare Other | Admitting: *Deleted

## 2019-01-12 DIAGNOSIS — E538 Deficiency of other specified B group vitamins: Secondary | ICD-10-CM

## 2019-01-12 DIAGNOSIS — F329 Major depressive disorder, single episode, unspecified: Secondary | ICD-10-CM

## 2019-01-12 DIAGNOSIS — G809 Cerebral palsy, unspecified: Secondary | ICD-10-CM

## 2019-01-12 DIAGNOSIS — F32A Depression, unspecified: Secondary | ICD-10-CM

## 2019-01-12 DIAGNOSIS — M5136 Other intervertebral disc degeneration, lumbar region: Secondary | ICD-10-CM

## 2019-01-13 ENCOUNTER — Other Ambulatory Visit: Payer: Self-pay

## 2019-01-13 NOTE — Patient Outreach (Signed)
[facility]) Care Management  01/13/2019  Anterio Demlow Stoneman 1955-11-24 [id number]  Medication Adherence call to Mr. Rody Prokosch [place] Identifiers Verify spoke with patient he is past due on Lisinopril 20 mg patient explain he is only taking 1/2 tablet daily patient receives a pill pack every week. Mr. Magiera is showing past due under [place].   [place] Management Direct Dial [phone]  Fax [phone] [email or web address]

## 2019-01-16 ENCOUNTER — Ambulatory Visit: Payer: Self-pay | Admitting: *Deleted

## 2019-01-16 DIAGNOSIS — F329 Major depressive disorder, single episode, unspecified: Secondary | ICD-10-CM | POA: Insufficient documentation

## 2019-01-16 DIAGNOSIS — M5136 Other intervertebral disc degeneration, lumbar region: Secondary | ICD-10-CM | POA: Insufficient documentation

## 2019-01-16 DIAGNOSIS — F32A Depression, unspecified: Secondary | ICD-10-CM | POA: Insufficient documentation

## 2019-01-16 DIAGNOSIS — E538 Deficiency of other specified B group vitamins: Secondary | ICD-10-CM

## 2019-01-16 DIAGNOSIS — G809 Cerebral palsy, unspecified: Secondary | ICD-10-CM

## 2019-01-16 NOTE — Chronic Care Management (AMB) (Signed)
Chronic Care Management   Follow Up Note   01/12/2019 Name: [name] MRN: [id number] DOB: Feb 11, 1956  Referred by: James Fraise, MD Reason for referral : Chronic Care Management (RN CCM Follow Up)   [name] is a 63 y.o. year old male who is a primary care patient of [name], [name], MD. The CCM team was consulted for assistance with chronic disease management and care coordination needs.    Review of patient status, including review of consultants reports, relevant laboratory and other test results, and collaboration with appropriate care team members and the patient's provider was performed as part of comprehensive patient evaluation and provision of chronic care management services.    I spoke with [name] by telephone today regarding the recent death of his sister as well as his lower back pain and need for transportation arrangement.   Outpatient Encounter Medications as of 01/12/2019  Medication Sig  . acetaminophen (TYLENOL) 325 MG tablet Take 2 tablets (650 mg total) by mouth every 6 (six) hours as needed for mild pain or headache.  . albuterol (PROAIR HFA) 108 (90 Base) MCG/ACT inhaler 1-2 puffs every 6 hours as needed wheezing or shortness of breath.  [name] aspirin EC 81 MG tablet Take 1 tablet (81 mg total) by mouth daily.  [name] atorvastatin (LIPITOR) 40 MG tablet Take 1 capsule by mouth at bedtime.  [name] buPROPion (WELLBUTRIN SR) 150 MG 12 hr tablet TAKE  (1)  TABLET TWICE A DAY.  . celecoxib (CELEBREX) 200 MG capsule Take 200 mg by mouth daily as needed for pain.  . COMBIVENT RESPIMAT 20-100 MCG/ACT AERS respimat Inhale 1 puff into the lungs every 6 (six) hours  . DULoxetine (CYMBALTA) 30 MG capsule Take 2 capsules (60 mg total) by mouth daily. WITH A FULL STOMACH AT SUPPER TIME  . fexofenadine (ALLEGRA) 180 MG tablet Take 1 tablet (180 mg total) by mouth daily. For allergy symptoms  . fluticasone (FLONASE) 50 MCG/ACT nasal spray Place 2 sprays into both nostrils daily as  needed for allergies or rhinitis.  . fluticasone furoate-vilanterol (BREO ELLIPTA) 100-25 MCG/INH AEPB Inhale 1 puff into the lungs daily.  . isosorbide mononitrate (IMDUR) 30 MG 24 hr tablet Take 1 tablet (30 mg total) by mouth daily.  . metoprolol tartrate (LOPRESSOR) 25 MG tablet Take 1/2 tablet by mouth two times a day and include in pill packs  . nitroGLYCERIN (NITROSTAT) 0.4 MG SL tablet Place 1 tablet (0.4 mg total) under the tongue every 5 (five) minutes x 3 doses as needed for chest pain.  . pantoprazole (PROTONIX) 40 MG tablet Take 1 tablet (40 mg total) by mouth 2 (two) times daily.  . tamsulosin (FLOMAX) 0.4 MG CAPS capsule TAKE (1) CAPSULE DAILY  . [DISCONTINUED] dapsone 100 MG tablet Take 1 tablet (100 mg total) by mouth daily.   Facility-Administered Encounter Medications as of 01/12/2019  Medication  . cyanocobalamin ((VITAMIN B-12)) injection 1,000 mcg     Goals Addressed            This Visit's Progress     Patient Stated   . "I need a ride to get my B12 shot" (pt-stated)       Current Barriers:  . Film/video editor.  . Literacy barriers . Transportation barriers . Cognitive Deficits  Nurse Case Manager Clinical Goal(s):  [name] Over the next 3 days, patient will have transportation arranged for B12 injection that is scheduled for 01/19/2019  Interventions:  . Discussed with [name] previously but  I am unsure if this has been arranged . Patient advised that he can use [name] . RN CCM will check with [name] to arrange pickup  Patient Self Care Activities:  . Currently UNABLE TO independently drive to medical appointments.   Please see past updates related to this goal by clicking on the "Past Updates" button in the selected goal      . "I want my back to stop hurting" (pt-stated)       Current Barriers:  . Film/video editor.  . Literacy barriers . Transportation barriers . Cognitive Deficits . Chronic Disease Management support and education needs related  to lower back pain  Nurse Case Manager Clinical Goal(s):  [name] Over the next 30 days, patient will verbalize basic understanding of lumbar degenerative disc disease process and self health management plan as evidenced by verbalization of pain improvement.  Interventions:  . Evaluation of current treatment plan related to degenerative disc disease and patient's adherence to plan as established by provider. . Advised patient to continue taking celebrex 200mg  as prescribed . Discussed plans with patient for ongoing care management follow up and provided patient with direct contact information for care management team . Medications reviewed. Patient states Celebrex helps the pain.  . Recommended stretches for lower back . Engineer, civil (consulting) with lower back stretches to be mailed . Chart reviewed and discussed most recent lumbar xray . Consider physical therapy. Will collaborate with PCP regarding this.   Patient Self Care Activities:  . Calls provider office for new concerns or questions  Initial goal documentation     . CCM Goal: Patient will move through the stages of grief related to the recent death of his sister, [name]. (pt-stated)       Current Barriers:  [name] Grief   Nurse Case Manager Clinical Goal(s):  [name] Over the next 14 days, patient will talk with [name] regarding grief reaction to the death of his sister  Interventions:  . Listened to patient talk about the recent death of his sister . Provided comfort . Recommended that he talk with James [name], [name] regarding coping strategies . Encouraged him to maintain contact with other family members and not to isolate himself . Encouraged him to reach out to the CCM team or other care providers as needed  Patient Self Care Activities:  . Able to call with questions or concerns . Does not drive and needs assistance with some daily care activities  Initial goal documentation         Follow Up Plan CCM team  will reach out over the next 14 days Patient will f/u with CCM team member or other providers as needed B12 shot arranged for 01/19/2019  James Sicilian, BSN, RN-BC [facility] Direct Dial: [phone]

## 2019-01-16 NOTE — Patient Instructions (Signed)
Visit Information  Goals Addressed            This Visit's Progress     Patient Stated   . "I need a ride to get my B12 shot" (pt-stated)       Current Barriers:  . Film/video editor.  . Literacy barriers . Transportation barriers . Cognitive Deficits  Nurse Case Manager Clinical Goal(s):  [name] Over the next 3 days, patient will have transportation arranged for B12 injection that is scheduled for 01/19/2019  Interventions:  . Discussed with [name] previously but I am unsure if this has been arranged . Patient advised that he can use [name] . RN CCM will check with [name] to arrange pickup  Patient Self Care Activities:  . Currently UNABLE TO independently drive to medical appointments.   Please see past updates related to this goal by clicking on the "Past Updates" button in the selected goal      . COMPLETED: "I need something for this spider bite" (pt-stated)       Current Barriers:  . Chronic Disease Management support and education needs related to wound care . Transportation . Lack of caregiver support  Nurse Case Manager Clinical Goal(s):  [name] Patient will be free of insect/spider bites or other skin wounds for the indefinite future  Interventions:  . Discussed with patient that he completed treatment with oral dapsone and topical antibiotic ointment . Discussed current state o Patient reports that the spider bites have healed up . Discussed objective findings with triage nurse who saw him this morning for his B12 injection o States that areas are completely healed . Advised patient to notify his PCP office at [phone] of any recurrence   Patient Self Care Activities:  . Self administers medications as prescribed . Calls pharmacy for medication refills  Please see past updates related to this goal by clicking on the "Past Updates" button in the selected goal   Issue Resolved. Goal Completed.      [name] "I want my back to stop hurting" (pt-stated)        Current Barriers:  . Film/video editor.  . Literacy barriers . Transportation barriers . Cognitive Deficits . Chronic Disease Management support and education needs related to lower back pain  Nurse Case Manager Clinical Goal(s):  [name] Over the next 30 days, patient will verbalize basic understanding of lumbar degenerative disc disease process and self health management plan as evidenced by verbalization of pain improvement.  Interventions:  . Evaluation of current treatment plan related to degenerative disc disease and patient's adherence to plan as established by provider. . Advised patient to continue taking celebrex 200mg  as prescribed . Discussed plans with patient for ongoing care management follow up and provided patient with direct contact information for care management team . Medications reviewed. Patient states Celebrex helps the pain.  . Recommended stretches for lower back . Engineer, civil (consulting) with lower back stretches to be mailed . Chart reviewed and discussed most recent lumbar xray . Consider physical therapy. Will collaborate with PCP regarding this.   Patient Self Care Activities:  . Calls provider office for new concerns or questions  Initial goal documentation     . CCM Goal: Patient will move through the stages of grief related to the recent death of his sister, [name]. (pt-stated)       Current Barriers:  [name] Grief   Nurse Case Manager Clinical Goal(s):  [name] Over the next 14 days, patient will talk with [name] regarding  grief reaction to the death of his sister  Interventions:  . Listened to patient talk about the recent death of his sister . Provided comfort . Recommended that he talk with [name], [name] regarding coping strategies . Encouraged him to maintain contact with other family members and not to isolate himself . Encouraged him to reach out to the CCM team or other care providers as needed  Patient Self Care Activities:   . Able to call with questions or concerns . Does not drive and needs assistance with some daily care activities  Initial goal documentation         The patient verbalized understanding of instructions provided today and declined a print copy of patient instruction materials.   Follow Up Plan CCM team will reach out over the next 14 days Patient will f/u with CCM team member or other providers as needed B12 shot arranged for 01/19/2019  [name], BSN, RN-BC [place] [facility] [place] Management Direct Dial: [phone]

## 2019-01-17 ENCOUNTER — Ambulatory Visit: Payer: Self-pay | Admitting: *Deleted

## 2019-01-17 ENCOUNTER — Ambulatory Visit: Payer: Self-pay | Admitting: Licensed Clinical Social Worker

## 2019-01-17 ENCOUNTER — Other Ambulatory Visit: Payer: Self-pay

## 2019-01-17 DIAGNOSIS — F329 Major depressive disorder, single episode, unspecified: Secondary | ICD-10-CM

## 2019-01-17 DIAGNOSIS — G809 Cerebral palsy, unspecified: Secondary | ICD-10-CM

## 2019-01-17 DIAGNOSIS — R482 Apraxia: Secondary | ICD-10-CM

## 2019-01-17 DIAGNOSIS — F32A Depression, unspecified: Secondary | ICD-10-CM

## 2019-01-17 DIAGNOSIS — I252 Old myocardial infarction: Secondary | ICD-10-CM | POA: Diagnosis not present

## 2019-01-17 DIAGNOSIS — F411 Generalized anxiety disorder: Secondary | ICD-10-CM

## 2019-01-17 DIAGNOSIS — E538 Deficiency of other specified B group vitamins: Secondary | ICD-10-CM

## 2019-01-17 DIAGNOSIS — Z550 Illiteracy and low-level literacy: Secondary | ICD-10-CM

## 2019-01-17 DIAGNOSIS — I25118 Atherosclerotic heart disease of native coronary artery with other forms of angina pectoris: Secondary | ICD-10-CM | POA: Diagnosis not present

## 2019-01-17 NOTE — Patient Instructions (Addendum)
Licensed Clinical Social Worker Visit Information  Goals we discussed today:    Goals                   . CCM Goal: Patient will move through the stages of grief related to the recent death of his sister, [name]. (pt-stated)       Current Barriers:   Grief   Nurse Case Manager Clinical Goal(s):   Over the next 14 days, patient will talk with [name] regarding grief reaction to the death of his sister  Interventions:   Talked with client about food supply of client  Talked with client about transport needs of client  [name] provided grief counseling for client related to recent death of his older sister. Encouraged him to maintain contact with other family members and not to isolate himself  Encouraged him to reach out to the CCM team or other care providers as needed  [place] to ask her to arrange transport for client appointment at [facility] on 01/24/2019  Patient Self Care Activities:   Able to call with questions or concerns  Does not drive and needs assistance with some daily care activities  Initial goal documentation    Materials Provided: No  Follow Up Plan: [name] to call client in next 3 weeks to talk with client about social work needs of client at that time  The patient verbalized understanding of instructions provided today and declined a print copy of patient instruction materials.   [name].Alejandria Wessells MSW, [name] Licensed Clinical Social Worker [facility] Care Management [phone]

## 2019-01-17 NOTE — Patient Outreach (Signed)
[facility]) Care Management  01/17/2019  [name] September 23, 1955 [id number]   Transportation arranged via [name] to appointment at [facility] on 01/24/19 @ 11:30 AM.   [name], [place] Worker [phone]

## 2019-01-17 NOTE — Chronic Care Management (AMB) (Signed)
  Chronic Care Management   Care Coordination 01/16/2019 Name: [name] MRN: [id number] DOB: 10/01/1955   Patient in need of transportation arrangement for B12 injection that is scheduled for 01/19/2019. I reached out to [name] by telephone but was unable to talk with him. I left a HIPAA compliant voicemail requesting a return call. He has arranged his own transportation with [name] in the past but I'm unsure if he has arranged transportation for this visit.   Goals Addressed            This Visit's Progress     Patient Stated   . "I need a ride to get my B12 shot" (pt-stated)       Current Barriers:  . Film/video editor.  . Literacy barriers . Transportation barriers . Cognitive Deficits  Nurse Case Manager Clinical Goal(s):  [name] Over the next 3 days, patient will have transportation arranged for B12 injection that is scheduled for 01/19/2019  Interventions:  . Discussed with [name] previously but I am unsure if this has been arranged . Patient advised that he can use [name] . RN CCM will check with [name] to arrange pickup . Left message for patient to return my call  Patient Self Care Activities:  . Currently UNABLE TO independently drive to medical appointments.   Please see past updates related to this goal by clicking on the "Past Updates" button in the selected goal          Follow up plan: The care management team will reach out to the patient again over the next 2 days.   B12 can be rescheduled if necessary  [name], BSN, RN-BC [place] / [place] Management Direct Dial: [phone]

## 2019-01-17 NOTE — Chronic Care Management (AMB) (Signed)
  Care Management Note   [name] is a 63 y.o. year old male who is a primary care patient of [name], James Gash, MD. The CM team was consulted for assistance with chronic disease management and care coordination.   I reached out to [place] by phone today.     Review of patient status, including review of consultants reports, relevant laboratory and other test results, and collaboration with appropriate care team members and the patient's provider was performed as part of comprehensive patient evaluation and provision of chronic care management services.   Social Determinants of Health: risk of social isolation; risk of tobacco use    Chronic Care Management from 04/11/2018 in [place]  PHQ-9 Total Score  5     GAD 7 : Generalized Anxiety Score 05/01/2016 03/31/2016  Nervous, Anxious, on Edge 1 3  Control/stop worrying 1 3  Worry too much - different things 1 3  Trouble relaxing 0 2  Restless 0 2  Easily annoyed or irritable 0 0  Afraid - awful might happen 0 0  Total GAD 7 Score 3 13  Anxiety Difficulty Not difficult at all Not difficult at all   Medications   Outpatient Medications   acetaminophen (TYLENOL) 325 MG tablet    albuterol (PROAIR HFA) 108 (90 Base) MCG/ACT inhaler    aspirin EC 81 MG tablet    atorvastatin (LIPITOR) 40 MG tablet    buPROPion (WELLBUTRIN SR) 150 MG 12 hr tablet    celecoxib (CELEBREX) 200 MG capsule    COMBIVENT RESPIMAT 20-100 MCG/ACT AERS respimat    DULoxetine (CYMBALTA) 30 MG capsule    fexofenadine (ALLEGRA) 180 MG tablet    fluticasone (FLONASE) 50 MCG/ACT nasal spray    fluticasone furoate-vilanterol (BREO ELLIPTA) 100-25 MCG/INH AEPB    isosorbide mononitrate (IMDUR) 30 MG 24 hr tablet    metoprolol tartrate (LOPRESSOR) 25 MG tablet    nitroGLYCERIN (NITROSTAT) 0.4 MG SL tablet    pantoprazole (PROTONIX) 40 MG tablet    tamsulosin (FLOMAX) 0.4 MG CAPS capsule   Clinic-Administered Medications   cyanocobalamin ((VITAMIN B-12)) injection 1,000 mcg    Goals        . CCM Goal: Patient will move through the stages of grief related to the recent death of his sister, [name]. (pt-stated)     Current Barriers:  [name] Grief   Nurse Case Manager Clinical Goal(s):  [name] Over the next 14 days, patient will talk with [name] regarding grief reaction to the death of his sister  Interventions:  . Talked with client about food supply of client . Talked with client about transport needs of client . [name] provided grief counseling for client related to recent death of his older sister. Encouraged him to maintain contact with other family members and not to isolate himself . Encouraged him to reach out to the CCM team or other care providers as needed . Contacted Amber Chrismon BSW to ask her to arrange transport for client appointment at [facility] on 01/24/2019  Patient Self Care Activities:  . Able to call with questions or concerns . Does not drive and needs assistance with some daily care activities  Initial goal documentation         Follow Up Plan: [name] to call client in next 3 weeks to assess the social work needs of client at that time  Roshad Tillema.Oletta Buehring MSW, [name] Licensed Clinical Social Worker [facility] Care Management [phone]

## 2019-01-19 ENCOUNTER — Ambulatory Visit: Payer: Medicare Other

## 2019-01-19 ENCOUNTER — Other Ambulatory Visit: Payer: Self-pay | Admitting: Family Medicine

## 2019-01-20 ENCOUNTER — Ambulatory Visit: Payer: Medicare Other | Admitting: *Deleted

## 2019-01-23 ENCOUNTER — Other Ambulatory Visit: Payer: Self-pay

## 2019-01-24 ENCOUNTER — Ambulatory Visit (INDEPENDENT_AMBULATORY_CARE_PROVIDER_SITE_OTHER): Payer: Medicare Other

## 2019-01-24 ENCOUNTER — Ambulatory Visit: Payer: Medicare Other | Admitting: *Deleted

## 2019-01-24 DIAGNOSIS — E538 Deficiency of other specified B group vitamins: Secondary | ICD-10-CM | POA: Diagnosis not present

## 2019-01-24 DIAGNOSIS — G809 Cerebral palsy, unspecified: Secondary | ICD-10-CM

## 2019-01-24 DIAGNOSIS — F32A Depression, unspecified: Secondary | ICD-10-CM

## 2019-01-24 DIAGNOSIS — Z23 Encounter for immunization: Secondary | ICD-10-CM | POA: Diagnosis not present

## 2019-01-24 DIAGNOSIS — F411 Generalized anxiety disorder: Secondary | ICD-10-CM

## 2019-01-24 DIAGNOSIS — F329 Major depressive disorder, single episode, unspecified: Secondary | ICD-10-CM

## 2019-01-24 NOTE — Progress Notes (Signed)
Cyanocobalamin injection given to left deltoid.  Patient tolerated well. 

## 2019-01-25 NOTE — Patient Instructions (Signed)
  Goals Addressed            This Visit's Progress     Patient Stated   . "I need a ride to get my B12 shot" (pt-stated)       Current Barriers:  . Film/video editor.  . Literacy barriers . Transportation barriers . Cognitive Deficits  Nurse Case Manager Clinical Goal(s):  [name] Over the next 20 days, patient will have transportation arranged for B12 injection that is scheduled for 02/21/19  Interventions:  . CCM team will arrange transportation at least 3 days before appointment . CCM team with communicate arrangement to patient   Patient Self Care Activities:  . Currently UNABLE TO independently drive to medical appointments.   Please see past updates related to this goal by clicking on the "Past Updates" button in the selected goal      . CCM Goal: Patient will move through the stages of grief related to the recent death of his sister, [name]. (pt-stated)       Current Barriers:  [name] Grief   Nurse Case Manager Clinical Goal(s):  [name] Over the next 14 days, patient will talk with [name] regarding grief reaction to the death of his sister  Interventions:  . Listened to patient talk about the recent death of his sister . Discussed recent family/social interactions . Provided comfort . Recommended that he talk with [name], [name] regarding coping strategies . Encouraged him to maintain contact with other family members and not to isolate himself . Encouraged him to reach out to the CCM team or other care providers as needed  Patient Self Care Activities:  . Able to call with questions or concerns . Does not drive and needs assistance with some daily care activities  Please see past updates related to this goal by clicking on the "Past Updates" button in the selected goal          Follow-Up Plan The care management team will reach out to the patient again over the next 30 days.  Patient has been provided with CCM contact information and will reach out as  needed.  [name], BSN, RN-BC Embedded Chronic Care Manager [facility] / [place] Management Direct Dial: [phone]

## 2019-01-25 NOTE — Chronic Care Management (AMB) (Signed)
  Chronic Care Management   Follow Up Note   01/24/2019 Name: [name] MRN: [id number] DOB: [birth year]  Referred by: [name], MD Reason for referral : Chronic Care Management   [name] is a 63 y.o. year old male who is a primary care patient of [name], [name], MD. The CCM team was consulted for assistance with chronic disease management and care coordination needs.    Review of patient status, including review of consultants reports, relevant laboratory and other test results, and collaboration with appropriate care team members and the patient's provider was performed as part of comprehensive patient evaluation and provision of chronic care management services.    I spoke with [name] by telephone today.   Goals Addressed            This Visit's Progress     Patient Stated   . "I need a ride to get my B12 shot" (pt-stated)       Current Barriers:  . Film/video editor.  . Literacy barriers . Transportation barriers . Cognitive Deficits  Nurse Case Manager Clinical Goal(s):  [name] Over the next 20 days, patient will have transportation arranged for B12 injection that is scheduled for 02/21/19  Interventions:  . CCM team will arrange transportation at least 3 days before appointment . CCM team with communicate arrangement to patient   Patient Self Care Activities:  . Currently UNABLE TO independently drive to medical appointments.   Please see past updates related to this goal by clicking on the "Past Updates" button in the selected goal      . CCM Goal: Patient will move through the stages of grief related to the recent death of his sister, [name]. (pt-stated)       Current Barriers:  [name] Grief   Nurse Case Manager Clinical Goal(s):  [name] Over the next 14 days, patient will talk with [name] regarding grief reaction to the death of his sister  Interventions:  . Listened to patient talk about the recent death of his sister . Discussed recent  family/social interactions . Provided comfort . Recommended that he talk with [name], [name] regarding coping strategies . Encouraged him to maintain contact with other family members and not to isolate himself . Encouraged him to reach out to the CCM team or other care providers as needed  Patient Self Care Activities:  . Able to call with questions or concerns . Does not drive and needs assistance with some daily care activities  Please see past updates related to this goal by clicking on the "Past Updates" button in the selected goal          Follow-Up Plan The care management team will reach out to the patient again over the next 30 days.  Patient has been provided with CCM contact information and will reach out as needed.  [name], BSN, RN-BC Embedded Chronic Care Manager [facility] / [place] Management Direct Dial: [phone]

## 2019-01-31 ENCOUNTER — Ambulatory Visit: Payer: Medicare Other | Admitting: *Deleted

## 2019-01-31 DIAGNOSIS — G809 Cerebral palsy, unspecified: Secondary | ICD-10-CM

## 2019-01-31 DIAGNOSIS — I10 Essential (primary) hypertension: Secondary | ICD-10-CM

## 2019-02-06 ENCOUNTER — Ambulatory Visit (INDEPENDENT_AMBULATORY_CARE_PROVIDER_SITE_OTHER): Payer: Medicare Other | Admitting: Licensed Clinical Social Worker

## 2019-02-06 DIAGNOSIS — R482 Apraxia: Secondary | ICD-10-CM

## 2019-02-06 DIAGNOSIS — G809 Cerebral palsy, unspecified: Secondary | ICD-10-CM

## 2019-02-06 DIAGNOSIS — I252 Old myocardial infarction: Secondary | ICD-10-CM | POA: Diagnosis not present

## 2019-02-06 DIAGNOSIS — E538 Deficiency of other specified B group vitamins: Secondary | ICD-10-CM

## 2019-02-06 DIAGNOSIS — I25118 Atherosclerotic heart disease of native coronary artery with other forms of angina pectoris: Secondary | ICD-10-CM | POA: Diagnosis not present

## 2019-02-06 DIAGNOSIS — Z550 Illiteracy and low-level literacy: Secondary | ICD-10-CM

## 2019-02-06 DIAGNOSIS — I209 Angina pectoris, unspecified: Secondary | ICD-10-CM

## 2019-02-06 DIAGNOSIS — F411 Generalized anxiety disorder: Secondary | ICD-10-CM

## 2019-02-06 NOTE — Patient Instructions (Addendum)
Licensed Clinical Education officer, museum Visit Information   Goals    Current Barriers:   Grief   Nurse Case Manager Clinical Goal(s):   Over the next 14 days, patient will talk with [name] regarding grief reaction to the death of his sister  Interventions:   Discussed recent family/social interactions (has support from two sisters)  [name] provided counseling support for client  Encouraged him to maintain contact with other family members and not to isolate himself  Encouraged him to reach out to the CCM team or other care providers as needed  Encouraged client to call his sisters for emotional support  Talked with client about in home aide support for client    Talked with client about upcoming client appointment  Patient Self Care Activities:   Able to call with questions or concerns  Does not drive and needs assistance with some daily care activities  Plan:    Client to talk with [name] about grief issues experienced by client   [name] to call client in next 3 weeks to talk with client about grief issues experienced by client Client to attend scheduled medical appointments   Materials Provided: No  Follow Up Plan: [name] to call client in next 3 weeks to talk with client about grief issues experienced by client  The patient verbalized understanding of instructions provided today and declined a print copy of patient instruction materials.   [name].Anyra Kaufman MSW, [name] Licensed Clinical Social Worker [facility] Care Management [phone]

## 2019-02-06 NOTE — Chronic Care Management (AMB) (Signed)
Care Management Note   [name] is a 63 y.o. year old male who is a primary care patient of [name], [name], MD. The CM team was consulted for assistance with chronic disease management and care coordination.   I reached out to [name] by phone today.    Review of patient status, including review of consultants reports, relevant laboratory and other test results, and collaboration with appropriate care team members and the patient's provider was performed as part of comprehensive patient evaluation and provision of chronic care management services.   Social Determinants of Health:  Risk of social isolation; risk of tobacco use    Chronic Care Management from 04/11/2018 in [place]  PHQ-9 Total Score  5     GAD 7 : Generalized Anxiety Score 05/01/2016 03/31/2016  Nervous, Anxious, on Edge 1 3  Control/stop worrying 1 3  Worry too much - different things 1 3  Trouble relaxing 0 2  Restless 0 2  Easily annoyed or irritable 0 0  Afraid - awful might happen 0 0  Total GAD 7 Score 3 13  Anxiety Difficulty Not difficult at all Not difficult at all    Medications   Outpatient Medications   acetaminophen (TYLENOL) 325 MG tablet    albuterol (PROAIR HFA) 108 (90 Base) MCG/ACT inhaler    aspirin EC 81 MG tablet    atorvastatin (LIPITOR) 40 MG tablet    buPROPion (WELLBUTRIN SR) 150 MG 12 hr tablet    celecoxib (CELEBREX) 200 MG capsule    COMBIVENT RESPIMAT 20-100 MCG/ACT AERS respimat    DULoxetine (CYMBALTA) 30 MG capsule    fexofenadine (ALLEGRA) 180 MG tablet    fluticasone (FLONASE) 50 MCG/ACT nasal spray    fluticasone furoate-vilanterol (BREO ELLIPTA) 100-25 MCG/INH AEPB    isosorbide mononitrate (IMDUR) 30 MG 24 hr tablet    metoprolol tartrate (LOPRESSOR) 25 MG tablet    nitroGLYCERIN (NITROSTAT) 0.4 MG SL tablet    pantoprazole (PROTONIX) 40 MG tablet    tamsulosin (FLOMAX) 0.4 MG CAPS capsule   Clinic-Administered Medications   cyanocobalamin ((VITAMIN B-12)) injection 1,000 mcg    Goals    Current Barriers:  [name] Grief   Nurse Case Manager Clinical Goal(s):  [name] Over the next 14 days, patient will talk with [name] regarding grief reaction to the death of his sister  Interventions:  . Discussed recent family/social interactions (has support from two sisters) . [name] provided counseling support for client . Encouraged him to maintain contact with other family members and not to isolate himself . Encouraged him to reach out to the CCM team or other care providers as needed . Encouraged client to call his sisters for emotional support . Talked with client about in home aide support for client  .  Talked with client about upcoming client appointment  Patient Self Care Activities:  . Able to call with questions or concerns . Does not drive and needs assistance with some daily care activities  Plan:    Client to talk with [name] about grief issues experienced by client   [name] to call client in next 3 weeks to talk with client about grief issues experienced by client Client to attend scheduled medical appointments  Please see past updates related to this goal by clicking on the "Past Updates" button in the selected goal   Follow Up Plan: [name] to call client in next 3 weeks to talk with client about grief issues experienced by client  [name].Angeleena Dueitt MSW, [name] Licensed Clinical Social Worker [facility] Care Management [phone]

## 2019-02-13 ENCOUNTER — Other Ambulatory Visit: Payer: Self-pay | Admitting: Family Medicine

## 2019-02-14 ENCOUNTER — Other Ambulatory Visit: Payer: Self-pay

## 2019-02-14 ENCOUNTER — Encounter: Payer: Self-pay | Admitting: Family Medicine

## 2019-02-14 ENCOUNTER — Ambulatory Visit (INDEPENDENT_AMBULATORY_CARE_PROVIDER_SITE_OTHER): Payer: Medicare Other | Admitting: Family Medicine

## 2019-02-14 DIAGNOSIS — F5104 Psychophysiologic insomnia: Secondary | ICD-10-CM | POA: Diagnosis not present

## 2019-02-14 DIAGNOSIS — F4321 Adjustment disorder with depressed mood: Secondary | ICD-10-CM

## 2019-02-14 MED ORDER — MIRTAZAPINE 15 MG PO TABS: 15.0000 mg | ORAL_TABLET | Freq: Every day | ORAL | 5 refills | Status: DC

## 2019-02-14 MED ORDER — BUPROPION HCL ER (SR) 150 MG PO TB12: 150.0000 mg | ORAL_TABLET | Freq: Every day | ORAL | 1 refills | Status: DC

## 2019-02-14 NOTE — Progress Notes (Signed)
Subjective:    Patient ID: [name], male    DOB: [birth year], 63 y.o.   MRN: [id number]   HPI: [name] is a 63 y.o. male presenting for poor sleep since his sister, [name], passed away three weeks ago. Not sleeping over a few minutes at a time.Denies increase in depression at this time. Closely followed by our CCM team as well.    Depression screen [facility] 2/9 11/01/2018 08/26/2018 08/22/2018 08/18/2018 08/05/2018  Decreased Interest 0 0 0 0 0  Down, Depressed, Hopeless 0 0 0 0 0  PHQ - 2 Score 0 0 0 0 0  Altered sleeping - - - - -  Tired, decreased energy - - - - -  Change in appetite - - - - -  Feeling bad or failure about yourself  - - - - -  Trouble concentrating - - - - -  Moving slowly or fidgety/restless - - - - -  Suicidal thoughts - - - - -  PHQ-9 Score - - - - -  Difficult doing work/chores - - - - -  Some recent data might be hidden     Relevant past medical, surgical, family and social history reviewed and updated as indicated.  Interim medical history since our last visit reviewed. Allergies and medications reviewed and updated.  ROS:  Review of Systems  Constitutional: Negative for activity change, appetite change and fatigue.  Respiratory: Negative for chest tightness and shortness of breath.   Cardiovascular: Negative for chest pain.  Gastrointestinal: Negative for abdominal pain.  Psychiatric/Behavioral: Negative for dysphoric mood. The patient is nervous/anxious.      Social History   Tobacco Use  Smoking Status Former Smoker  . Packs/day: 0.50  . Years: 41.00  . Pack years: 20.50  . Types: Cigarettes  . Quit date: 06/06/2018  . Years since quitting: 0.6  Smokeless Tobacco Never Used  Tobacco Comment   Patient quit on his own in February 2020       Objective:     Wt Readings from Last 3 Encounters:  11/01/18 223 lb 9.6 oz (101.4 kg)  10/07/18 228 lb 9.6 oz (103.7 kg)  08/22/18 224 lb (101.6 kg)     Exam deferred. Pt. Harboring due  to COVID 19. Phone visit performed.   Assessment & Plan:  No diagnosis found.  Meds ordered this encounter  Medications  . buPROPion (WELLBUTRIN SR) 150 MG 12 hr tablet    Sig: Take 1 tablet (150 mg total) by mouth daily with breakfast.    Dispense:  90 tablet    Refill:  1  . mirtazapine (REMERON) 15 MG tablet    Sig: Take 1 tablet (15 mg total) by mouth at bedtime. For sleep    Dispense:  30 tablet    Refill:  5    No orders of the defined types were placed in this encounter.     There are no diagnoses linked to this encounter.  Virtual Visit via telephone Note  I discussed the limitations, risks, security and privacy concerns of performing an evaluation and management service by telephone and the availability of in person appointments. The patient was identified with two identifiers. Pt.expressed understanding and agreed to proceed. Pt. Is at home. Dr. Livia Snellen is in his office.  Follow Up Instructions:   I discussed the assessment and treatment plan with the patient. The patient was provided an opportunity to ask questions and all were answered. The patient agreed with  the plan and demonstrated an understanding of the instructions.   The patient was advised to call back or seek an in-person evaluation if the symptoms worsen or if the condition fails to improve as anticipated.   Total minutes including chart review and phone contact time: 12   Follow up plan: Return if symptoms worsen or fail to improve.  [name], MD [place]

## 2019-02-14 NOTE — Chronic Care Management (AMB) (Signed)
  Chronic Care Management   Follow-up Note  01/31/2019 Name: ILAN KEHRES MRN: [id number] DOB: Jun 23, 1955  An unsucessful telephone follow-up attempt was made today.   Follow up plan: RN to talk with patient at B12 injection appointment on 02/21/19   [name], BSN, RN-BC [place] / [place] Management Direct Dial: [phone]

## 2019-02-14 NOTE — Patient Outreach (Signed)
[facility]) Care Management  02/14/2019  [name] 1955/06/01 [id number]   Transportation arranged via [place] for appointment at [place] on 02/21/19 @ 11:15 AM.  [name], [place] Worker [phone]

## 2019-02-14 NOTE — Patient Instructions (Signed)
  Follow up plan: RN to talk with patient at B12 injection appointment on 02/21/19   [name], BSN, RN-BC [place] / [place] Management Direct Dial: [phone]

## 2019-02-15 ENCOUNTER — Telehealth: Payer: Self-pay

## 2019-02-15 ENCOUNTER — Ambulatory Visit: Payer: Self-pay | Admitting: Licensed Clinical Social Worker

## 2019-02-15 DIAGNOSIS — I25118 Atherosclerotic heart disease of native coronary artery with other forms of angina pectoris: Secondary | ICD-10-CM

## 2019-02-15 DIAGNOSIS — I209 Angina pectoris, unspecified: Secondary | ICD-10-CM

## 2019-02-15 DIAGNOSIS — G809 Cerebral palsy, unspecified: Secondary | ICD-10-CM

## 2019-02-15 DIAGNOSIS — F4321 Adjustment disorder with depressed mood: Secondary | ICD-10-CM

## 2019-02-15 DIAGNOSIS — Z550 Illiteracy and low-level literacy: Secondary | ICD-10-CM

## 2019-02-15 DIAGNOSIS — F411 Generalized anxiety disorder: Secondary | ICD-10-CM

## 2019-02-15 DIAGNOSIS — R482 Apraxia: Secondary | ICD-10-CM

## 2019-02-15 DIAGNOSIS — E538 Deficiency of other specified B group vitamins: Secondary | ICD-10-CM

## 2019-02-15 DIAGNOSIS — I252 Old myocardial infarction: Secondary | ICD-10-CM

## 2019-02-15 DIAGNOSIS — E785 Hyperlipidemia, unspecified: Secondary | ICD-10-CM

## 2019-02-15 NOTE — Patient Instructions (Addendum)
Licensed Clinical Education officer, museum Visit Information  Goals we discussed today:      CCM Goal: Patient will move through the stages of grief related to the recent death of his sister, [name]. (pt-stated)       Current Barriers:   Grief   Nurse Case Manager Clinical Goal(s):   Over the next 14 days, patient will talk with [name] regarding grief reaction to the death of his sister  Interventions:   Listened to patient talk about the recent death of his sister  Discussed recent family/social interactions (client has support from his younger sister)        [name] provided counseling support for client  Encouraged him to maintain contact with other family members and not to isolate himself  Encouraged him to reach out to the CCM team or other care providers as needed  Patient Self Care Activities:   Able to call with questions or concerns  Does not drive and needs assistance with some daily care activities  Please see past updates related to this goal by clicking on the "Past Updates" button in the selected goal       Materials Provided: No  Follow Up Plan:  [name] to call client in next 3 weeks to talk with client about grief symptoms of client and client management of grief symptoms  The patient verbalized understanding of instructions provided today and declined a print copy of patient instruction materials.   [name].Ural Acree MSW, [name] Licensed Clinical Social Worker [facility] Care Management [phone]

## 2019-02-15 NOTE — Chronic Care Management (AMB) (Addendum)
Care Management Note   [name] is a 63 y.o. year old male who is a primary care patient of [name], [name], MD. The CM team was consulted for assistance with chronic disease management and care coordination.   I reached out to [place] by phone today.    Review of patient status, including review of consultants reports, relevant laboratory and other test results, and collaboration with appropriate care team members and the patient's provider was performed as part of comprehensive patient evaluation and provision of chronic care management services.   Social determinants of health: risk of social isolation; risk of tobacco use    Chronic Care Management from 04/11/2018 in [place]  PHQ-9 Total Score  5     GAD 7 : Generalized Anxiety Score 05/01/2016 03/31/2016  Nervous, Anxious, on Edge 1 3  Control/stop worrying 1 3  Worry too much - different things 1 3  Trouble relaxing 0 2  Restless 0 2  Easily annoyed or irritable 0 0  Afraid - awful might happen 0 0  Total GAD 7 Score 3 13  Anxiety Difficulty Not difficult at all Not difficult at all   Medications   Outpatient Medications   acetaminophen (TYLENOL) 325 MG tablet    albuterol (PROAIR HFA) 108 (90 Base) MCG/ACT inhaler    aspirin EC 81 MG tablet    atorvastatin (LIPITOR) 40 MG tablet    buPROPion (WELLBUTRIN SR) 150 MG 12 hr tablet    celecoxib (CELEBREX) 200 MG capsule    COMBIVENT RESPIMAT 20-100 MCG/ACT AERS respimat    DULoxetine (CYMBALTA) 30 MG capsule    fexofenadine (ALLEGRA) 180 MG tablet    fluticasone (FLONASE) 50 MCG/ACT nasal spray    fluticasone furoate-vilanterol (BREO ELLIPTA) 100-25 MCG/INH AEPB    isosorbide mononitrate (IMDUR) 30 MG 24 hr tablet    metoprolol tartrate (LOPRESSOR) 25 MG tablet    mirtazapine (REMERON) 15 MG tablet    nitroGLYCERIN (NITROSTAT) 0.4 MG SL tablet    pantoprazole (PROTONIX) 40 MG tablet    tamsulosin (FLOMAX) 0.4 MG CAPS capsule    Clinic-Administered Medications   cyanocobalamin ((VITAMIN B-12)) injection 1,000 mcg     Goals        . CCM Goal: Patient will move through the stages of grief related to the recent death of his sister, [name]. (pt-stated)     Current Barriers:  [name] Grief   Nurse Case Manager Clinical Goal(s):  [name] Over the next 14 days, patient will talk with [name] regarding grief reaction to the death of his sister  Interventions:  . Listened to patient talk about the recent death of his sister . Discussed recent family/social interactions (client has support from his younger sister)        [name] provided counseling support for client . Encouraged him to maintain contact with other family members and not to isolate himself . Encouraged him to reach out to the CCM team or other care providers as needed  Patient Self Care Activities:  . Able to call with questions or concerns . Does not drive and needs assistance with some daily care activities  Please see past updates related to this goal by clicking on the "Past Updates" button in the selected goal       .     Follow Up Plan: [name] to call client in next 3 weeks to talk with client about grief symptoms of client and client management of grief symptoms  [name].Rashaud Ybarbo MSW, [name] Licensed Clinical Social Worker [facility] Care Management [phone]

## 2019-02-20 ENCOUNTER — Other Ambulatory Visit: Payer: Self-pay

## 2019-02-21 ENCOUNTER — Ambulatory Visit (INDEPENDENT_AMBULATORY_CARE_PROVIDER_SITE_OTHER): Payer: Medicare Other | Admitting: *Deleted

## 2019-02-21 VITALS — Wt 241.0 lb

## 2019-02-21 DIAGNOSIS — F329 Major depressive disorder, single episode, unspecified: Secondary | ICD-10-CM | POA: Diagnosis not present

## 2019-02-21 DIAGNOSIS — F32A Depression, unspecified: Secondary | ICD-10-CM

## 2019-02-21 DIAGNOSIS — G809 Cerebral palsy, unspecified: Secondary | ICD-10-CM

## 2019-02-21 DIAGNOSIS — E538 Deficiency of other specified B group vitamins: Secondary | ICD-10-CM

## 2019-02-22 ENCOUNTER — Telehealth: Payer: Self-pay | Admitting: Family Medicine

## 2019-02-22 NOTE — Telephone Encounter (Signed)
FYI

## 2019-02-23 NOTE — Chronic Care Management (AMB) (Addendum)
Chronic Care Management   Follow Up Note   02/21/2019 Name: [name] MRN: [id number] DOB: Mar 18, 1956  Referred by: [name], MD Reason for referral : Chronic Care Management (Face to Face follow-up)   [name] is a 63 y.o. year old male who is a primary care patient of [name], [name], MD. The CCM team was consulted for assistance with chronic disease management and care coordination needs.    Review of patient status, including review of consultants reports, relevant laboratory and other test results, and collaboration with appropriate care team members and the patient's provider was performed as part of comprehensive patient evaluation and provision of chronic care management services.    SDOH (Social Determinants of Health) screening performed today: Depression  . See Care Plan for related entries.   Subjective:  I spoke with [name] in person today when he came into the office for his B12 injection. He is still struggling with grief related to the recent death of his sister. He is spending more time with his other two sisters but he's finding it difficult to stay motivated and busy during the day. He doesn't enjoy doing things that he used to enjoy. He has been talking with [name], [name] regarding this and had a recent visit with PCP where medications were adjusted to hopefully help with the depression component. He also stated that his sister thought he has lost weight. He is not eating regularly because he doesn't have an appetite.   Objective: Outpatient Encounter Medications as of 02/21/2019  Medication Sig  . acetaminophen (TYLENOL) 325 MG tablet Take 2 tablets (650 mg total) by mouth every 6 (six) hours as needed for mild pain or headache.  . albuterol (PROAIR HFA) 108 (90 Base) MCG/ACT inhaler 1-2 puffs every 6 hours as needed wheezing or shortness of breath.  [name] aspirin EC 81 MG tablet Take 1 tablet (81 mg total) by mouth daily.  [name] atorvastatin (LIPITOR) 40  MG tablet Take 1 capsule by mouth at bedtime.  [name] buPROPion (WELLBUTRIN SR) 150 MG 12 hr tablet Take 1 tablet (150 mg total) by mouth daily with breakfast.  . celecoxib (CELEBREX) 200 MG capsule Take 200 mg by mouth daily as needed for pain.  . COMBIVENT RESPIMAT 20-100 MCG/ACT AERS respimat Inhale 1 puff into the lungs every 6 (six) hours  . DULoxetine (CYMBALTA) 30 MG capsule Take 2 capsules (60 mg total) by mouth daily. WITH A FULL STOMACH AT SUPPER TIME  . fexofenadine (ALLEGRA) 180 MG tablet Take 1 tablet (180 mg total) by mouth daily. For allergy symptoms  . fluticasone (FLONASE) 50 MCG/ACT nasal spray Place 2 sprays into both nostrils daily as needed for allergies or rhinitis.  . fluticasone furoate-vilanterol (BREO ELLIPTA) 100-25 MCG/INH AEPB Inhale 1 puff into the lungs daily.  . isosorbide mononitrate (IMDUR) 30 MG 24 hr tablet Take 1 tablet (30 mg total) by mouth daily.  . metoprolol tartrate (LOPRESSOR) 25 MG tablet Take 1/2 tablet by mouth two times a day and include in pill packs  . mirtazapine (REMERON) 15 MG tablet Take 1 tablet (15 mg total) by mouth at bedtime. For sleep  . nitroGLYCERIN (NITROSTAT) 0.4 MG SL tablet Place 1 tablet (0.4 mg total) under the tongue every 5 (five) minutes x 3 doses as needed for chest pain.  . pantoprazole (PROTONIX) 40 MG tablet TAKE  (1)  TABLET TWICE A DAY.  . tamsulosin (FLOMAX) 0.4 MG CAPS capsule TAKE (1) CAPSULE DAILY   Facility-Administered  Encounter Medications as of 02/21/2019  Medication  . cyanocobalamin ((VITAMIN B-12)) injection 1,000 mcg    Wt Readings from Last 3 Encounters:  02/21/19 241 lb (109.3 kg)  11/01/18 223 lb 9.6 oz (101.4 kg)  10/07/18 228 lb 9.6 oz (103.7 kg)  *weight is up despite patient's decreased appetite  Assessment & Care Plan     . "I want to keep my medications straight" (pt-stated)       Duloxetine was changed from morning to evening at last visit with PCP.   Current Barriers:  [name] Knowledge Deficits  related to medication management . Lacks caregiver support.  . Film/video editor.  . Literacy barriers . Transportation barriers . Cognitive Deficits  Nurse Case Manager Clinical Goal(s):  [name] Over the next 90 days, patient will reach out to PCP, pharmacist, or RN CCM with any questions about his medications  Interventions:   Discussed current medications and most recent changes with patient  Verified that he is still receiving deliveries from [place] with [facility] that schedule changes made at last office visit have been implemented in their packaging system  Advised patient of medication schedule changes and showed patient a picture of each medication that has been affected.  Verified that he did notice that change with his new pill pack  Patient Self Care Activities:  . Currently UNABLE TO independently drive. . Attends all scheduled provider appointments with arranged transportation.   Please see past updates related to this goal by clicking on the "Past Updates" button in the selected goal        . Improve Insufficient Self Care Secondary to Grief       Patient reports decreased appetite and decreased enjoyment in normal activities.   Current Barriers:  [name] Grief reaction to sister's death . depression  Nurse Case Manager Clinical Goal(s):  [name] Over the next 30 days, patient will continue to talk with [name] regarding grief secondary to sister's death . Over the next 30 days, patient will work with RN to improve oral intake in presence of decreased appetite secondary to grief process  Interventions:  . Discussed meal planning . Weighed patient today: 241 lb . Discussed sleep & daily activities . Encouraged patient to continue talking with [name] . Encouraged patient to continue spending time with sisters . Encouraged patient to recognize that healing will take time . Encouraged patient to get back into doing regularly activities at a pace that  is comfortable for him . Advised to reach out to [facility] team as needed  Patient Self Care Activities:  . Performs ADL's independently . Unable to perform IADLs independently. Has not have transportation arranged.   Initial goal documentation      Follow Up Plan The care management team will reach out to the patient again over the next 30 days.  Patient will continue to talk with [name] regarding grief over sister's death Patient has CCM contact information and will reach out as needed   [name], BSN, RN-BC [place] / [place] Management Direct Dial: [phone]

## 2019-02-23 NOTE — Patient Instructions (Signed)
. "  I want to keep my medications straight" (pt-stated)       Duloxetine was changed from morning to evening at last visit with PCP.   Current Barriers:  [name] Knowledge Deficits related to medication management . Lacks caregiver support.  . Film/video editor.  . Literacy barriers . Transportation barriers . Cognitive Deficits  Nurse Case Manager Clinical Goal(s):  [name] Over the next 90 days, patient will reach out to PCP, pharmacist, or RN CCM with any questions about his medications  Interventions:   Discussed current medications and most recent changes with patient  Verified that he is still receiving deliveries from [place] with [facility] that schedule changes made at last office visit have been implemented in their packaging system  Advised patient of medication schedule changes and showed patient a picture of each medication that has been affected.  Verified that he did notice that change with his new pill pack  Patient Self Care Activities:  . Currently UNABLE TO independently drive. . Attends all scheduled provider appointments with arranged transportation.   Please see past updates related to this goal by clicking on the "Past Updates" button in the selected goal        . Improve Insufficient Self Care Secondary to Grief       Patient reports decreased appetite and decreased enjoyment in normal activities.   Current Barriers:  [name] Grief reaction to sister's death . depression  Nurse Case Manager Clinical Goal(s):  [name] Over the next 30 days, patient will continue to talk with [name] regarding grief secondary to sister's death . Over the next 30 days, patient will work with RN to improve oral intake in presence of decreased appetite secondary to grief process  Interventions:  . Discussed meal planning . Weighed patient today: 241 lb . Discussed sleep & daily activities . Encouraged patient to continue talking with [name] . Encouraged patient to  continue spending time with sisters . Encouraged patient to recognize that healing will take time . Encouraged patient to get back into doing regularly activities at a pace that is comfortable for him . Advised to reach out to [facility] team as needed  Patient Self Care Activities:  . Performs ADL's independently . Unable to perform IADLs independently. Has not have transportation arranged.   Initial goal documentation      Follow Up Plan The care management team will reach out to the patient again over the next 30 days.  Patient will continue to talk with [name] regarding grief over sister's death Patient has CCM contact information and will reach out as needed   [name], BSN, RN-BC [place] [facility] [place] Management Direct Dial: [phone]

## 2019-02-28 ENCOUNTER — Ambulatory Visit: Payer: Self-pay | Admitting: Licensed Clinical Social Worker

## 2019-02-28 ENCOUNTER — Ambulatory Visit (INDEPENDENT_AMBULATORY_CARE_PROVIDER_SITE_OTHER): Payer: Medicare Other | Admitting: Family Medicine

## 2019-02-28 ENCOUNTER — Encounter: Payer: Self-pay | Admitting: Family Medicine

## 2019-02-28 DIAGNOSIS — Z550 Illiteracy and low-level literacy: Secondary | ICD-10-CM

## 2019-02-28 DIAGNOSIS — I209 Angina pectoris, unspecified: Secondary | ICD-10-CM

## 2019-02-28 DIAGNOSIS — F32A Depression, unspecified: Secondary | ICD-10-CM

## 2019-02-28 DIAGNOSIS — F329 Major depressive disorder, single episode, unspecified: Secondary | ICD-10-CM

## 2019-02-28 DIAGNOSIS — G809 Cerebral palsy, unspecified: Secondary | ICD-10-CM

## 2019-02-28 DIAGNOSIS — I25118 Atherosclerotic heart disease of native coronary artery with other forms of angina pectoris: Secondary | ICD-10-CM

## 2019-02-28 DIAGNOSIS — F411 Generalized anxiety disorder: Secondary | ICD-10-CM

## 2019-02-28 DIAGNOSIS — R482 Apraxia: Secondary | ICD-10-CM

## 2019-02-28 DIAGNOSIS — F4321 Adjustment disorder with depressed mood: Secondary | ICD-10-CM

## 2019-02-28 DIAGNOSIS — E785 Hyperlipidemia, unspecified: Secondary | ICD-10-CM

## 2019-02-28 DIAGNOSIS — E538 Deficiency of other specified B group vitamins: Secondary | ICD-10-CM

## 2019-02-28 DIAGNOSIS — I252 Old myocardial infarction: Secondary | ICD-10-CM

## 2019-02-28 NOTE — Progress Notes (Signed)
Subjective:    Patient ID: [name], male    DOB: 1955-06-09, 63 y.o.   MRN: [id number]   HPI: [name] is a 63 y.o. male presenting for loss of energy. Went to help someone rake leaves 3 days ago. Been laying around the house since then. May have a small fever. Sleepin well since adding new medicine.    Depression screen [facility] 2/9 02/23/2019 11/01/2018 08/26/2018 08/22/2018 08/18/2018  Decreased Interest 2 0 0 0 0  Down, Depressed, Hopeless 2 0 0 0 0  PHQ - 2 Score 4 0 0 0 0  Altered sleeping 0 - - - -  Tired, decreased energy 1 - - - -  Change in appetite 2 - - - -  Feeling bad or failure about yourself  1 - - - -  Trouble concentrating 1 - - - -  Moving slowly or fidgety/restless 0 - - - -  Suicidal thoughts 0 - - - -  PHQ-9 Score 9 - - - -  Difficult doing work/chores Very difficult - - - -  Some recent data might be hidden     Relevant past medical, surgical, family and social history reviewed and updated as indicated.  Interim medical history since our last visit reviewed. Allergies and medications reviewed and updated.  ROS:  Review of Systems  Constitutional: Positive for activity change and fatigue.  HENT: Negative.   Eyes: Negative for visual disturbance.  Respiratory: Negative for cough and shortness of breath.   Cardiovascular: Negative for chest pain and leg swelling.  Gastrointestinal: Negative for abdominal pain, diarrhea, nausea and vomiting.  Genitourinary: Negative for difficulty urinating.  Musculoskeletal: Negative for arthralgias and myalgias.  Skin: Negative for rash.  Neurological: Negative for headaches.  Psychiatric/Behavioral: Negative for sleep disturbance.     Social History   Tobacco Use  Smoking Status Former Smoker  . Packs/day: 0.50  . Years: 41.00  . Pack years: 20.50  . Types: Cigarettes  . Quit date: 06/06/2018  . Years since quitting: 0.7  Smokeless Tobacco Never Used  Tobacco Comment   Patient quit on his own in  February 2020       Objective:     Wt Readings from Last 3 Encounters:  02/21/19 241 lb (109.3 kg)  11/01/18 223 lb 9.6 oz (101.4 kg)  10/07/18 228 lb 9.6 oz (103.7 kg)     Exam deferred. Pt. Harboring due to COVID 19. Phone visit performed.   Assessment & Plan:   1. Depression, unspecified depression type     No orders of the defined types were placed in this encounter.   No orders of the defined types were placed in this encounter.     Diagnoses and all orders for this visit:  Depression, unspecified depression type    Virtual Visit via telephone Note  I discussed the limitations, risks, security and privacy concerns of performing an evaluation and management service by telephone and the availability of in person appointments. The patient was identified with two identifiers. Pt.expressed understanding and agreed to proceed. Pt. Is at home. Dr. Livia Snellen is in his office.  Follow Up Instructions:   I discussed the assessment and treatment plan with the patient. The patient was provided an opportunity to ask questions and all were answered. The patient agreed with the plan and demonstrated an understanding of the instructions.   The patient was advised to call back or seek an in-person evaluation if the symptoms worsen or if  the condition fails to improve as anticipated.   Total minutes including chart review and phone contact time: 9   Follow up plan: No follow-ups on file.  [name], MD [place]

## 2019-02-28 NOTE — Chronic Care Management (AMB) (Signed)
  Care Management Note   [name] is a 62 y.o. year old male who is a primary care patient of [name], [name], MD. The CM team was consulted for assistance with chronic disease management and care coordination.   I reached out to [place] by phone today.    Review of patient status, including review of consultants reports, relevant laboratory and other test results, and collaboration with appropriate care team members and the patient's provider was performed as part of comprehensive patient evaluation and provision of chronic care management services.   Social Determinants of Health: risk of social isolation; risk of tobacco use    Chronic Care Management from 02/21/2019 in [place]  PHQ-9 Total Score  9     GAD 7 : Generalized Anxiety Score 05/01/2016 03/31/2016  Nervous, Anxious, on Edge 1 3  Control/stop worrying 1 3  Worry too much - different things 1 3  Trouble relaxing 0 2  Restless 0 2  Easily annoyed or irritable 0 0  Afraid - awful might happen 0 0  Total GAD 7 Score 3 13  Anxiety Difficulty Not difficult at all Not difficult at all   Medications   Outpatient Medications   acetaminophen (TYLENOL) 325 MG tablet    albuterol (PROAIR HFA) 108 (90 Base) MCG/ACT inhaler    aspirin EC 81 MG tablet    atorvastatin (LIPITOR) 40 MG tablet    buPROPion (WELLBUTRIN SR) 150 MG 12 hr tablet    celecoxib (CELEBREX) 200 MG capsule    COMBIVENT RESPIMAT 20-100 MCG/ACT AERS respimat    DULoxetine (CYMBALTA) 30 MG capsule    fexofenadine (ALLEGRA) 180 MG tablet    fluticasone (FLONASE) 50 MCG/ACT nasal spray    fluticasone furoate-vilanterol (BREO ELLIPTA) 100-25 MCG/INH AEPB    isosorbide mononitrate (IMDUR) 30 MG 24 hr tablet    metoprolol tartrate (LOPRESSOR) 25 MG tablet    mirtazapine (REMERON) 15 MG tablet    nitroGLYCERIN (NITROSTAT) 0.4 MG SL tablet    pantoprazole (PROTONIX) 40 MG tablet    tamsulosin (FLOMAX) 0.4 MG CAPS capsule    Clinic-Administered Medications   cyanocobalamin ((VITAMIN B-12)) injection 1,000 mcg    Goals    Current Barriers:   Grief   Nurse Case Manager Clinical Goal(s):   Over the next 14 days, patient will talk with [name] regarding grief reaction to the death of his sister  Interventions:   Previously discussed recent family/social interactions (has support from his younger sister)  [name] provided counseling support for client  Previously encouraged him to maintain contact with other family members and not to isolate himself  Talked with client about current client needs   Talked with client about his decreased energy level and reduced sleeping  Patient Self Care Activities:   Able to call with questions or concerns  Does not drive and needs assistance with some daily care activities  Plan:    Client to talk with [name] about grief issues experienced by client   [name] to call client in next 3 weeks to talk with client about grief issues experienced by client Client to attend scheduled medical appointments   Follow Up Plan: [name] to call client in next 3 weeks to talk with client about grief issues experienced by client  [name].Raeley Gilmore MSW, [name] Licensed Clinical Social Worker [facility] Care Management [phone]

## 2019-02-28 NOTE — Patient Instructions (Addendum)
Licensed Clinical Education officer, museum Visit Information  Goals we discussed today:      Current Barriers:   Grief   Nurse Case Manager Clinical Goal(s):  Over the next 14 days, patient will talk with [name] regarding grief reaction to the death of his sister  Interventions:   Previously discussed recent family/social interactions(has support from his younger sister)  [name] providedcounseling support for client  Previously encouraged him to maintain contact with other family members and not to isolate himself  Talked with client about current client needs   Talked with client about his decreased energy level and reduced sleeping  Patient Self Care Activities:   Able to call with questions or concerns  Does not drive and needs assistance with some daily care activities  Plan:   Client to talk with [name] about grief issues experienced by client  [name] to call client in next 3 weeks to talk with client about grief issues experienced by client Client to attend scheduled medical appointments   Follow Up Plan:[name] to call client in next 3 weeks to talk with client about grief issues experienced by client   Materials Provided: No  The patient verbalized understanding of instructions provided today and declined a print copy of patient instruction materials.   [name].Linet Brash MSW, [name] Licensed Clinical Social Worker [facility] Care Management [phone]

## 2019-03-06 ENCOUNTER — Ambulatory Visit: Payer: Self-pay | Admitting: *Deleted

## 2019-03-06 DIAGNOSIS — E538 Deficiency of other specified B group vitamins: Secondary | ICD-10-CM

## 2019-03-06 DIAGNOSIS — G809 Cerebral palsy, unspecified: Secondary | ICD-10-CM

## 2019-03-06 NOTE — Chronic Care Management (AMB) (Signed)
  Chronic Care Management   Care Coordination Note  03/06/2019 Name: [name] MRN: [id number] DOB: 17-Apr-1956  Consulted by [name], [name] regarding [place]'s complaint of a red, itchy rash on his right arm that has some clear fluid in it. He was concerned that it might be related to his b12 injections. [place] is scheduled for a televisit with Dr Livia Snellen for tomorrow. He has infantile cerebral palsy, cognitive deficits, and illiteracy that make it difficult for him to understand directions sometimes.   Follow up plan: RN will f/u with patient by telephone on 03/09/2019 regarding rash and to makes sure he understands the directions Dr Livia Snellen gives him tomorrow.  Patient is aware to call the CCM team with any chronic care concerns   [name], BSN, RN-BC [place] / [place] Management Direct Dial: [phone]

## 2019-03-07 ENCOUNTER — Other Ambulatory Visit: Payer: Self-pay | Admitting: Family Medicine

## 2019-03-07 ENCOUNTER — Ambulatory Visit (INDEPENDENT_AMBULATORY_CARE_PROVIDER_SITE_OTHER): Payer: Medicare Other | Admitting: Family Medicine

## 2019-03-07 ENCOUNTER — Encounter: Payer: Self-pay | Admitting: Family Medicine

## 2019-03-07 DIAGNOSIS — L249 Irritant contact dermatitis, unspecified cause: Secondary | ICD-10-CM

## 2019-03-07 MED ORDER — PREDNISONE 10 MG PO TABS: ORAL_TABLET | ORAL | 0 refills | Status: DC

## 2019-03-07 NOTE — Progress Notes (Signed)
Subjective:    Patient ID: [name], male    DOB: 05/09/1955, 63 y.o.   MRN: [id number]   HPI: [name] is a 63 y.o. male presenting for rash on the forearms. Started after he helped a friend get up some leaves. Also had a B12 shot the day before that. It is red and itches. No dyspnea.    Depression screen [facility] 2 2/9 02/23/2019 11/01/2018 08/26/2018 08/22/2018 08/18/2018  Decreased Interest 2 0 0 0 0  Down, Depressed, Hopeless 2 0 0 0 0  PHQ - 2 Score 4 0 0 0 0  Altered sleeping 0 - - - -  Tired, decreased energy 1 - - - -  Change in appetite 2 - - - -  Feeling bad or failure about yourself  1 - - - -  Trouble concentrating 1 - - - -  Moving slowly or fidgety/restless 0 - - - -  Suicidal thoughts 0 - - - -  PHQ-9 Score 9 - - - -  Difficult doing work/chores Very difficult - - - -  Some recent data might be hidden     Relevant past medical, surgical, family and social history reviewed and updated as indicated.  Interim medical history since our last visit reviewed. Allergies and medications reviewed and updated.  ROS:  Review of Systems  noncontributory Social History   Tobacco Use  Smoking Status Former Smoker  . Packs/day: 0.50  . Years: 41.00  . Pack years: 20.50  . Types: Cigarettes  . Quit date: 06/06/2018  . Years since quitting: 0.7  Smokeless Tobacco Never Used  Tobacco Comment   Patient quit on his own in February 2020       Objective:     Wt Readings from Last 3 Encounters:  02/21/19 241 lb (109.3 kg)  11/01/18 223 lb 9.6 oz (101.4 kg)  10/07/18 228 lb 9.6 oz (103.7 kg)     Exam deferred. Pt. Harboring due to COVID 19. Phone visit performed.   Assessment & Plan:   1. Irritant contact dermatitis, unspecified trigger     Meds ordered this encounter  Medications  . predniSONE (DELTASONE) 10 MG tablet    Sig: Take 5 daily for 2 days followed by 4,3,2 and 1 for 2 days each.    Dispense:  30 tablet    Refill:  0    No orders of the  defined types were placed in this encounter.     Diagnoses and all orders for this visit:  Irritant contact dermatitis, unspecified trigger  Other orders -     predniSONE (DELTASONE) 10 MG tablet; Take 5 daily for 2 days followed by 4,3,2 and 1 for 2 days each.    Virtual Visit via telephone Note  I discussed the limitations, risks, security and privacy concerns of performing an evaluation and management service by telephone and the availability of in person appointments. The patient was identified with two identifiers. Pt.expressed understanding and agreed to proceed. Pt. Is at home. Dr. Livia Snellen is in his office.  Follow Up Instructions:   I discussed the assessment and treatment plan with the patient. The patient was provided an opportunity to ask questions and all were answered. The patient agreed with the plan and demonstrated an understanding of the instructions.   The patient was advised to call back or seek an in-person evaluation if the symptoms worsen or if the condition fails to improve as anticipated.   Total minutes including chart  review and phone contact time: 8   Follow up plan: Return if symptoms worsen or fail to improve.  [name], MD [place]

## 2019-03-07 NOTE — Progress Notes (Signed)
Only number given is not a working number. Three attempts each one reaching a recording stating not a valid number. WS

## 2019-03-08 ENCOUNTER — Telehealth: Payer: Self-pay | Admitting: Family Medicine

## 2019-03-08 NOTE — Telephone Encounter (Signed)
Pt was delivered Prednisone today by [facility]

## 2019-03-09 ENCOUNTER — Ambulatory Visit: Payer: Medicare Other | Admitting: *Deleted

## 2019-03-09 DIAGNOSIS — G809 Cerebral palsy, unspecified: Secondary | ICD-10-CM

## 2019-03-09 DIAGNOSIS — L249 Irritant contact dermatitis, unspecified cause: Secondary | ICD-10-CM

## 2019-03-09 MED ORDER — TRIAMCINOLONE ACETONIDE 0.1 % EX CREA: 1.0000 "application " | TOPICAL_CREAM | Freq: Three times a day (TID) | CUTANEOUS | 0 refills | Status: DC

## 2019-03-09 NOTE — Patient Instructions (Signed)
Visit Information  Goals Addressed            This Visit's Progress     Patient Stated   . "I want this rash to go away" (pt-stated)       Current Barriers:  [name] Knowledge Deficits related to cause of rash and proper treatment  Nurse Case Manager Clinical Goal(s):  [name] Over the next 7 days, patient will see marked improvement or resolution in rash on arm  Interventions:  . Discussed HPI with patient . Chart reviewed including office note from visit with Dr Livia Snellen yesterday . Reviewed and discussed medications with patient which included prednisone directions o Had patient repeat prednisone directions to me so that I could verify understanding . Collaborated with Dr Livia Snellen to order a topical steroid cream, triamcinolone, to help with itching discomfort . [place] to deliver to patient . Advised patient to apply cream to affected area only 2 to 3 times a day until resolution. Do not apply to face or genitals. Patient stated understanding.  . Recommended cool showers as hot can increase itching . Recommended cool compresses for symptoms management . Advised patient to call me at [phone] or PCP at [phone] with any new or worsening symptoms or if rash does not improve/resolve over the next week  Patient Self Care Activities:  . Self administers medications as prescribed . Calls provider office for new concerns or questions . [place] and delivers medications . He has help from his sisters with IADLs  Initial goal documentation     . "I want to make sure my aid is coming like she's supposed to" (pt-stated)       Current Barriers:  [name] Knowledge Deficits related to in home aid company . Lacks caregiver support.  . Literacy barriers . Transportation barriers  Nurse Case Manager Clinical Goal(s):  [name] Over the next 7 days, patient will talk with in-home aide employer regarding the number of hours he is supposed to have help  Interventions:   . [employer] at [phone] and left a message requesting a return phone call with the name of the agency that provides in home services to [name].  o I did not receive a return call from [place] . Provided patient/sister with telephone number for [facility] and asked that they call to find out the agency that is providing Tyshan's Aide. They will need to call the agency directly to discuss their concerns about hours . Parth agreed that he and his sister will call and will f/u with me as needed  Patient Self Care Activities:  . Calls provider office for new concerns or questions  Please see past updates related to this goal by clicking on the "Past Updates" button in the selected goal         The patient verbalized understanding of instructions provided today and declined a print copy of patient instruction materials.   The care management team will reach out to the patient again over the next 14 days.   [name], BSN, RN-BC Embedded Chronic Care Manager [facility] / [place] Management Direct Dial: [phone]

## 2019-03-09 NOTE — Addendum Note (Signed)
Addended by: [name] on: 03/09/2019 02:02 PM   Modules accepted: Orders

## 2019-03-09 NOTE — Chronic Care Management (AMB) (Signed)
Chronic Care Management   Follow Up Note   03/09/2019 Name: [name] MRN: [id number] DOB: February 19, 1956  Referred by: [name], MD Reason for referral : Chronic Care Management (RN Follow up)   [name] is a 63 y.o. year old male who is a primary care patient of [name], [name], MD. The CCM team was consulted for assistance with chronic disease management and care coordination needs.    Review of patient status, including review of consultants reports, relevant laboratory and other test results, and collaboration with appropriate care team members and the patient's provider was performed as part of comprehensive patient evaluation and provision of chronic care management services.    Subjective: I spoke with [name] by telephone today. He has concerns that his [place] is not coming as often as she is supposed to. She is contracted through [employer] who partners with Medicaid but we're unsure of which agency she works for directly. [place] receives in home services due to disability related to infantile cerebral palsy.   I also followed up with [name] regarding the contact dermatitis rash on his arm that he had a telephone visit for yesterday. He did receive the Prednisone and was able to explain the directions to me. He complains that the area itches a lot and he's trying not to scratch it.   Objective:  Outpatient Encounter Medications as of 03/09/2019  Medication Sig  . acetaminophen (TYLENOL) 325 MG tablet Take 2 tablets (650 mg total) by mouth every 6 (six) hours as needed for mild pain or headache.  . albuterol (PROAIR HFA) 108 (90 Base) MCG/ACT inhaler 1-2 puffs every 6 hours as needed wheezing or shortness of breath.  [name] aspirin EC 81 MG tablet Take 1 tablet (81 mg total) by mouth daily.  [name] atorvastatin (LIPITOR) 40 MG tablet Take 1 capsule by mouth at bedtime.  [name] buPROPion (WELLBUTRIN SR) 150 MG 12 hr tablet Take 1 tablet (150 mg total) by mouth  daily with breakfast.  . celecoxib (CELEBREX) 200 MG capsule Take 200 mg by mouth daily as needed for pain.  . COMBIVENT RESPIMAT 20-100 MCG/ACT AERS respimat Inhale 1 puff into the lungs every 6 (six) hours  . DULoxetine (CYMBALTA) 30 MG capsule Take 2 capsules (60 mg total) by mouth daily. WITH A FULL STOMACH AT SUPPER TIME  . fexofenadine (ALLEGRA) 180 MG tablet Take 1 tablet (180 mg total) by mouth daily. For allergy symptoms  . fluticasone (FLONASE) 50 MCG/ACT nasal spray Place 2 sprays into both nostrils daily as needed for allergies or rhinitis.  . fluticasone furoate-vilanterol (BREO ELLIPTA) 100-25 MCG/INH AEPB Inhale 1 puff into the lungs daily.  . isosorbide mononitrate (IMDUR) 30 MG 24 hr tablet Take 1 tablet (30 mg total) by mouth daily.  . metoprolol tartrate (LOPRESSOR) 25 MG tablet Take 1/2 tablet by mouth two times a day and include in pill packs  . mirtazapine (REMERON) 15 MG tablet Take 1 tablet (15 mg total) by mouth at bedtime. For sleep  . nitroGLYCERIN (NITROSTAT) 0.4 MG SL tablet Place 1 tablet (0.4 mg total) under the tongue every 5 (five) minutes x 3 doses as needed for chest pain.  . pantoprazole (PROTONIX) 40 MG tablet TAKE  (1)  TABLET TWICE A DAY.  [name] predniSONE (DELTASONE) 10 MG tablet Take 5 daily for 2 days followed by 4,3,2 and 1 for 2 days each.  . tamsulosin (FLOMAX) 0.4 MG CAPS capsule TAKE (1) CAPSULE DAILY   Facility-Administered  Encounter Medications as of 03/09/2019  Medication  . cyanocobalamin ((VITAMIN B-12)) injection 1,000 mcg    RN Assessment & Care Plan   . "I want this rash to go away" (pt-stated)       Current Barriers:  [name] Knowledge Deficits related to cause of rash and proper treatment  Nurse Case Manager Clinical Goal(s):  [name] Over the next 7 days, patient will see marked improvement or resolution in rash on arm  Interventions:  . Discussed HPI with patient . Chart reviewed including office note from visit with Dr Livia Snellen yesterday .  Reviewed and discussed medications with patient which included prednisone directions o Had patient repeat prednisone directions to me so that I could verify understanding . Collaborated with Dr Livia Snellen to order a topical steroid cream, triamcinolone, to help with itching discomfort . [place] to deliver to patient . Advised patient to apply cream to affected area only 2 to 3 times a day until resolution. Do not apply to face or genitals. Patient stated understanding.  . Recommended cool showers as hot can increase itching . Recommended cool compresses for symptoms management . Advised patient to call me at [phone] or PCP at [phone] with any new or worsening symptoms or if rash does not improve/resolve over the next week  Patient Self Care Activities:  . Self administers medications as prescribed . Calls provider office for new concerns or questions . [place] and delivers medications . He has help from his sisters with IADLs  Initial goal documentation     . "I want to make sure my aid is coming like she's supposed to" (pt-stated)       Current Barriers:  [name] Knowledge Deficits related to in home aid company . Lacks caregiver support.  . Literacy barriers . Transportation barriers  Nurse Case Manager Clinical Goal(s):  [name] Over the next 7 days, patient will talk with in-home aide employer regarding the number of hours he is supposed to have help  Interventions:  . [employer] at [phone] and left a message requesting a return phone call with the name of the agency that provides in home services to [name].  o I did not receive a return call from [place] . Provided patient/sister with telephone number for [facility] and asked that they call to find out the agency that is providing [name]'s Aide. They will need to call the agency directly to discuss their concerns about hours . [place] agreed that he and his sister will call and will f/u  with me as needed  Patient Self Care Activities:  . Calls provider office for new concerns or questions  Please see past updates related to this goal by clicking on the "Past Updates" button in the selected goal        Follow-up Plan The care management team will reach out to the patient again over the next 14 days.    [name], BSN, RN-BC Embedded Chronic Care Manager [facility] / [place] [facility] Direct Dial: [phone]

## 2019-03-14 ENCOUNTER — Ambulatory Visit: Payer: Medicare Other | Admitting: *Deleted

## 2019-03-14 DIAGNOSIS — L249 Irritant contact dermatitis, unspecified cause: Secondary | ICD-10-CM

## 2019-03-14 DIAGNOSIS — E538 Deficiency of other specified B group vitamins: Secondary | ICD-10-CM

## 2019-03-14 DIAGNOSIS — G809 Cerebral palsy, unspecified: Secondary | ICD-10-CM

## 2019-03-14 NOTE — Chronic Care Management (AMB) (Signed)
  Chronic Care Management   Follow Up Note   03/14/2019 Name: [name] MRN: [id number] DOB: 01-18-1956  Referred by: [name], MD Reason for referral : Chronic Care Management (Follow up on rash)   [name] is a 63 y.o. year old male who is a primary care patient of [name], [name], MD. The CCM team was consulted for assistance with chronic disease management and care coordination needs.    Review of patient status, including review of consultants reports, relevant laboratory and other test results, and collaboration with appropriate care team members and the patient's provider was performed as part of comprehensive patient evaluation and provision of chronic care management services.     RN Care Plan  . "I need transportation to come in and get my B12 shot" (pt-stated)       Current Barriers:  . Film/video editor.  . Literacy barriers . Transportation barriers  Nurse Case Manager Clinical Goal(s):  [name] Over the next 7 days, patient will have transportation arranged for B12 injection appointment  Interventions:  . Reviewed appointment information with patient . Collaborated with [name] to arrange transportation  Patient Self Care Activities:  . Performs most ADLs but does have in home Aide and has help form his sisters for most all IADLs   Initial goal documentation     . "I want this rash to go away" (pt-stated)       Current Barriers:  [name] Knowledge Deficits related to cause of rash and proper treatment  Nurse Case Manager Clinical Goal(s):  [name] Over the next 7 days, patient will see marked improvement or resolution in rash on arm  Interventions:  . Discussed HPI with patient o He's finished prednisone o Denies pain but states that it itches really bad.Kenalog helps for brief period.  o Has been applying kenalog cream as directed o States that some areas have scabbed over but others look like "holes"in his skin . Recommended that he continue kenalog as  directed and start using mupirocin, that he was prescribed for prior cellulitis, twice daily as well . Recommended cool showers as hot can increase itching . Recommended cool compresses for symptoms management . Advised patient to call me at [phone] or PCP at [phone] with any new or worsening symptoms or if rash does not improve/resolve over the next week . Questioning viral vs bacterial etiology since rash did not improve with steroid and he has what sounds like blisters, crusting, and ulcerations. No pain though.  Patient Self Care Activities:  . Self administers medications as prescribed . Calls provider office for new concerns or questions . [place] and delivers medications . He has help from his sisters with IADLs  Please see past updates related to this goal by clicking on the "Past Updates" button in the selected goal         Follow-up RN will follow-up over the next 7 days Patient will seek medical attention as needed Patient will keep B12 appt for 03/24/2019 CCM team will arrange transportation for [place], BSN, RN-BC [place] / [place] Management Direct Dial: [phone]

## 2019-03-14 NOTE — Patient Instructions (Signed)
RN Care Plan  . "I need transportation to come in and get my B12 shot" (pt-stated)       Current Barriers:  . Film/video editor.  . Literacy barriers . Transportation barriers  Nurse Case Manager Clinical Goal(s):  [name] Over the next 7 days, patient will have transportation arranged for B12 injection appointment  Interventions:  . Reviewed appointment information with patient . Collaborated with [name] to arrange transportation  Patient Self Care Activities:  . Performs most ADLs but does have in home Aide and has help form his sisters for most all IADLs   Initial goal documentation     . "I want this rash to go away" (pt-stated)       Current Barriers:  [name] Knowledge Deficits related to cause of rash and proper treatment  Nurse Case Manager Clinical Goal(s):  [name] Over the next 7 days, patient will see marked improvement or resolution in rash on arm  Interventions:  . Discussed HPI with patient o He's finished prednisone o Denies pain but states that it itches really bad.Kenalog helps for brief period.  o Has been applying kenalog cream as directed o States that some areas have scabbed over but others look like "holes"in his skin . Recommended that he continue kenalog as directed and start using mupirocin, that he was prescribed for prior cellulitis, twice daily as well . Recommended cool showers as hot can increase itching . Recommended cool compresses for symptoms management . Advised patient to call me at [phone] or PCP at [phone] with any new or worsening symptoms or if rash does not improve/resolve over the next week . Questioning viral vs bacterial etiology since rash did not improve with steroid and he has what sounds like blisters, crusting, and ulcerations. No pain though.  Patient Self Care Activities:  . Self administers medications as prescribed . Calls provider office for new concerns or questions . [place] and delivers  medications . He has help from his sisters with IADLs  Please see past updates related to this goal by clicking on the "Past Updates" button in the selected goal       The patient verbalized understanding of instructions provided today and declined a print copy of patient instruction materials.     Follow-up RN will follow-up over the next 7 days Patient will seek medical attention as needed Patient will keep B12 appt for 03/24/2019 CCM team will arrange transportation for B12 Appt  [name], BSN, RN-BC [place] [facility] [place] [facility] Direct Dial: [phone]

## 2019-03-15 ENCOUNTER — Telehealth: Payer: Medicare Other

## 2019-03-20 ENCOUNTER — Ambulatory Visit: Payer: Medicare Other | Admitting: *Deleted

## 2019-03-20 ENCOUNTER — Other Ambulatory Visit: Payer: Self-pay

## 2019-03-20 DIAGNOSIS — G809 Cerebral palsy, unspecified: Secondary | ICD-10-CM

## 2019-03-20 DIAGNOSIS — L249 Irritant contact dermatitis, unspecified cause: Secondary | ICD-10-CM

## 2019-03-20 DIAGNOSIS — E538 Deficiency of other specified B group vitamins: Secondary | ICD-10-CM

## 2019-03-20 NOTE — Patient Instructions (Signed)
Visit Information  Goals Addressed            This Visit's Progress     Patient Stated   . "I need transportation to come in and get my B12 shot" (pt-stated)       Current Barriers:  . Film/video editor.  . Literacy barriers . Transportation barriers  Nurse Case Manager Clinical Goal(s):  [name] Over the next 7 days, patient will have transportation arranged for B12 injection appointment  Interventions:  . Reviewed appointment information with patient . Confirmed with [name] that transportation has been arranged for Friday 03/24/2019  Patient Self Care Activities:  . Performs most ADLs but does have in home Aide and has help form his sisters for most all IADLs   Please see past updates related to this goal by clicking on the "Past Updates" button in the selected goal      . "I want this rash to go away" (pt-stated)       Current Barriers:  [name] Knowledge Deficits related to cause of rash and proper treatment  Nurse Case Manager Clinical Goal(s):  [name] Over the next 7 days, patient will keep appointment with Dr Livia Snellen for evaluation of his rash.  Interventions:  . Discussed HPI with patient o Denies pain but states that it itches really bad. Kenalog helps for brief period.  o Has been applying kenalog cream as directed o Has been applying mupirocin ointment twice daily as directed o States that some areas have scabbed over but others look like "holes"in his skin . Recommended that he continue kenalog as directed and start using mupirocin, that he was prescribed for prior cellulitis, twice daily as well . Recommended cool showers as hot can increase itching . Recommended cool compresses for symptoms management . Consulted by [name] after he spoke with Mr Gandy earlier today . Scheduled appointment with PCP for 03/24/19 after his B12 injection . Advised patient to call me at [phone] or PCP at [phone] with any new or worsening symptoms or if rash does not improve/resolve over  the next week . Questioning viral etiology since rash did not improve with steroid and antibiotic ointment and he has what sounds like blisters, crusting, and ulcerations. No pain though.  Patient Self Care Activities:  . Self administers medications as prescribed . Calls provider office for new concerns or questions . [place] and delivers medications . He has help from his sisters with IADLs  Please see past updates related to this goal by clicking on the "Past Updates" button in the selected goal      . "I want to make sure my aid is coming like she's supposed to" (pt-stated)       Current Barriers:  [name] Knowledge Deficits related to in home aid company . Lacks caregiver support.  . Literacy barriers . Transportation barriers  Nurse Case Manager Clinical Goal(s):  [name] Over the next 7 days, patient will talk with in-home aide employer regarding the number of hours he is supposed to have help  Interventions:  . Patient called [employer] at [phone] and left a message requesting a return phone call with the name of the agency that provides in home services but he has not received a return call. [name] Encouraged [place] to reach out to [employer] again  Patient Self Care Activities:  . Calls provider office for new concerns or questions  Please see past updates related to this goal by clicking on the "Past Updates" button in  the selected goal         The patient verbalized understanding of instructions provided today and declined a print copy of patient instruction materials.   The care management team will reach out to the patient again over the next 14 days.   [name], BSN, RN-BC Embedded Chronic Care Manager [facility] [place] [facility] Direct Dial: [phone]

## 2019-03-20 NOTE — Chronic Care Management (AMB) (Signed)
Chronic Care Management   Follow Up Note   03/20/2019 Name: [name] MRN: [id number] DOB: 12-04-1955  Referred by: [name], MD Reason for referral : Chronic Care Management (RN Follow Up)   [name] is a 63 y.o. year old male who is a primary care patient of [name], [name], MD. The CCM team was consulted for assistance with chronic disease management and care coordination needs.    Review of patient status, including review of consultants reports, relevant laboratory and other test results, and collaboration with appropriate care team members and the patient's provider was performed as part of comprehensive patient evaluation and provision of chronic care management services.    I spoke with [name] by telephone today regarding his ongoing rash, transportation to his B12 injection appointment, and his in-home aide services.  RN ASSESSMENT & CARE PLAN     . "I need transportation to come in and get my B12 shot" (pt-stated)       Current Barriers:  . Film/video editor.  . Literacy barriers . Transportation barriers  Nurse Case Manager Clinical Goal(s):  [name] Over the next 7 days, patient will have transportation arranged for B12 injection appointment  Interventions:  . Reviewed appointment information with patient . Confirmed with [name] that transportation has been arranged for Friday 03/24/2019  Patient Self Care Activities:  . Performs most ADLs but does have in home Aide and has help form his sisters for most all IADLs   Please see past updates related to this goal by clicking on the "Past Updates" button in the selected goal      . "I want this rash to go away" (pt-stated)       Current Barriers:  [name] Knowledge Deficits related to cause of rash and proper treatment  Nurse Case Manager Clinical Goal(s):  [name] Over the next 7 days, patient will keep appointment with Dr Livia Hale for evaluation of his rash.  Interventions:  . Discussed HPI with patient o  Denies pain but states that it itches really bad. Kenalog helps for brief period.  o Has been applying kenalog cream as directed o Has been applying mupirocin ointment twice daily as directed o States that some areas have scabbed over but others look like "holes"in his skin . Recommended that he continue kenalog as directed and start using mupirocin, that he was prescribed for prior cellulitis, twice daily as well . Recommended cool showers as hot can increase itching . Recommended cool compresses for symptoms management . Consulted by [name] after he spoke with [name] earlier today . Scheduled appointment with PCP for 03/24/19 after his B12 injection . Advised patient to call me at [phone] or PCP at [phone] with any new or worsening symptoms or if rash does not improve/resolve over the next week . Questioning viral etiology since rash did not improve with steroid and antibiotic ointment and he has what sounds like blisters, crusting, and ulcerations. No pain though.  Patient Self Care Activities:  . Self administers medications as prescribed . Calls provider office for new concerns or questions . [place] and delivers medications . He has help from his sisters with IADLs  Please see past updates related to this goal by clicking on the "Past Updates" button in the selected goal      . "I want to make sure my aid is coming like she's supposed to" (pt-stated)       Current Barriers:  [name] Knowledge Deficits related to in  home aid company . Lacks caregiver support.  . Literacy barriers . Transportation barriers  Nurse Case Manager Clinical Goal(s):  [name] Over the next 7 days, patient will talk with in-home aide employer regarding the number of hours he is supposed to have help  Interventions:  . Patient called [employer] at [phone] and left a message requesting a return phone call with the name of the agency that provides in home services but he has not  received a return call. [name] Encouraged [name] to reach out to [employer] again  Patient Self Care Activities:  . Calls provider office for new concerns or questions  Please see past updates related to this goal by clicking on the "Past Updates" button in the selected goal         Follow-up Plan The care management team will reach out to the patient again over the next 14 days.    [name], BSN, RN-BC Embedded Chronic Care Manager [facility] [place] [facility] Direct Dial: [phone]

## 2019-03-20 NOTE — Patient Outreach (Signed)
[facility]) Care Management  03/20/2019  [name] 13-Dec-1955 [id number]   Transportation arranged via [place] for appointment at [place] on 03/24/19 @ 10:00.  Left message with patient informing him that pick up time is 9:30AM.  [name], [place] Worker [phone]

## 2019-03-24 ENCOUNTER — Ambulatory Visit (INDEPENDENT_AMBULATORY_CARE_PROVIDER_SITE_OTHER): Payer: Medicare Other

## 2019-03-24 ENCOUNTER — Ambulatory Visit: Payer: Medicare Other | Admitting: Family Medicine

## 2019-03-24 ENCOUNTER — Ambulatory Visit (INDEPENDENT_AMBULATORY_CARE_PROVIDER_SITE_OTHER): Payer: Medicare Other | Admitting: Family Medicine

## 2019-03-24 ENCOUNTER — Encounter: Payer: Self-pay | Admitting: Family Medicine

## 2019-03-24 ENCOUNTER — Other Ambulatory Visit: Payer: Self-pay

## 2019-03-24 VITALS — BP 135/75 | HR 99 | Temp 97.7°F | Ht 73.0 in | Wt 243.4 lb

## 2019-03-24 DIAGNOSIS — R21 Rash and other nonspecific skin eruption: Secondary | ICD-10-CM

## 2019-03-24 DIAGNOSIS — L308 Other specified dermatitis: Secondary | ICD-10-CM

## 2019-03-24 DIAGNOSIS — E538 Deficiency of other specified B group vitamins: Secondary | ICD-10-CM | POA: Diagnosis not present

## 2019-03-24 MED ORDER — FLUOCINONIDE-E 0.05 % EX CREA: 1.0000 "application " | TOPICAL_CREAM | Freq: Two times a day (BID) | CUTANEOUS | 5 refills | Status: DC

## 2019-03-24 MED ORDER — BETAMETHASONE SOD PHOS & ACET 6 (3-3) MG/ML IJ SUSP
30.0000 mg | Freq: Once | INTRAMUSCULAR | Status: AC
  Administered 2019-03-24: 30 mg via INTRAMUSCULAR

## 2019-03-24 NOTE — Progress Notes (Signed)
Cyanocobalamin injection given to left deltoid, patient tolerated well.

## 2019-03-24 NOTE — Progress Notes (Signed)
Chief Complaint  Patient presents with  . Rash    been going on for a week itches.    HPI  Patient presents today for rash on right forearm not respondinto the triamcinolone cream he has been using. It itches. It is dry and scaly. He has no known exxposure.   PMH: Smoking status noted ROS: Per HPI  Objective: BP 135/75   Pulse 99   Temp 97.7 F (36.5 C) (Temporal)   Ht 6\' 1"  (1.854 m)   Wt 243 lb 6.4 oz (110.4 kg)   SpO2 95%   BMI 32.11 kg/m  Gen: NAD, alert, cooperative with exam Skin: white scale with a few papules that appear excoriated. Covers much of the dorsal right forearm.  Ext: No edema, warm Neuro: Alert and oriented, No gross deficits  Assessment and plan:  1. Rash   2. Other eczema     Meds ordered this encounter  Medications  . fluocinonide-emollient (LIDEX-E) 0.05 % cream    Sig: Apply 1 application topically 2 (two) times daily. To affected areas    Dispense:  60 g    Refill:  5  . betamethasone acetate-betamethasone sodium phosphate (CELESTONE) injection 30 mg    No orders of the defined types were placed in this encounter.   Follow up as needed.  [name], MD

## 2019-03-27 ENCOUNTER — Ambulatory Visit: Payer: Medicare Other | Admitting: Family Medicine

## 2019-03-28 ENCOUNTER — Other Ambulatory Visit: Payer: Self-pay | Admitting: Family Medicine

## 2019-03-28 DIAGNOSIS — I10 Essential (primary) hypertension: Secondary | ICD-10-CM

## 2019-03-28 DIAGNOSIS — I25118 Atherosclerotic heart disease of native coronary artery with other forms of angina pectoris: Secondary | ICD-10-CM

## 2019-04-05 ENCOUNTER — Ambulatory Visit (INDEPENDENT_AMBULATORY_CARE_PROVIDER_SITE_OTHER): Payer: Medicare Other | Admitting: Family Medicine

## 2019-04-05 ENCOUNTER — Other Ambulatory Visit: Payer: Self-pay

## 2019-04-05 ENCOUNTER — Encounter: Payer: Self-pay | Admitting: Family Medicine

## 2019-04-05 DIAGNOSIS — L308 Other specified dermatitis: Secondary | ICD-10-CM | POA: Diagnosis not present

## 2019-04-05 MED ORDER — CETAPHIL MOISTURIZING EX LOTN: 1.0000 "application " | TOPICAL_LOTION | Freq: Two times a day (BID) | CUTANEOUS | 0 refills | Status: AC

## 2019-04-05 NOTE — Progress Notes (Signed)
Subjective:    Patient ID: [name], male    DOB: 01-29-1956, 63 y.o.   MRN: [id number]   HPI: [name] is a 63 y.o. male presenting for rash on right arm. No better. Two sores popped up.Itches. Swelling. Denies scratching. Using alcohol on it. Tells me that the body wash he is using is making it worse. Thinks he is allergic to it.   Depression screen [facility] 2/9 02/23/2019 11/01/2018 08/26/2018 08/22/2018 08/18/2018  Decreased Interest 2 0 0 0 0  Down, Depressed, Hopeless 2 0 0 0 0  PHQ - 2 Score 4 0 0 0 0  Altered sleeping 0 - - - -  Tired, decreased energy 1 - - - -  Change in appetite 2 - - - -  Feeling bad or failure about yourself  1 - - - -  Trouble concentrating 1 - - - -  Moving slowly or fidgety/restless 0 - - - -  Suicidal thoughts 0 - - - -  PHQ-9 Score 9 - - - -  Difficult doing work/chores Very difficult - - - -  Some recent data might be hidden     Relevant past medical, surgical, family and social history reviewed and updated as indicated.  Interim medical history since our last visit reviewed. Allergies and medications reviewed and updated.  ROS:  Review of Systems  Constitutional: Negative for fever.  Skin: Positive for rash.     Social History   Tobacco Use  Smoking Status Former Smoker  . Packs/day: 0.50  . Years: 41.00  . Pack years: 20.50  . Types: Cigarettes  . Quit date: 06/06/2018  . Years since quitting: 0.8  Smokeless Tobacco Never Used  Tobacco Comment   Patient quit on his own in February 2020       Objective:     Wt Readings from Last 3 Encounters:  03/24/19 243 lb 6.4 oz (110.4 kg)  02/21/19 241 lb (109.3 kg)  11/01/18 223 lb 9.6 oz (101.4 kg)     Exam deferred. Pt. [place] due to COVID 19. Phone visit performed.   Assessment & Plan:   1. Other eczema     Pt. Instructed to DC the body wash and alcohol.   Wash with [facility], rinse with clear water. Apply cetaphil lotion twice a day. Apply fluocinonide BID,  but not less than one hour apart from the cetaphil.  Diagnoses and all orders for this visit:  Other eczema  Other orders -     cetaphil (CETAPHIL) lotion; Apply 1 application topically 2 (two) times daily. For rash. Apply after washing with [place] shower gel and rinsing with clear warm water.   DC alcohol rub!  Virtual Visit via telephone Note  I discussed the limitations, risks, security and privacy concerns of performing an evaluation and management service by telephone and the availability of in person appointments. The patient was identified with two identifiers. Pt.expressed understanding and agreed to proceed. Pt. Is at home. Dr. Livia Snellen is in his office.  Follow Up Instructions:   I discussed the assessment and treatment plan with the patient. The patient was provided an opportunity to ask questions and all were answered. The patient agreed with the plan and demonstrated an understanding of the instructions.   The patient was advised to call back or seek an in-person evaluation if the symptoms worsen or if the condition fails to improve as anticipated.   Total minutes including chart review and phone  contact time: 9   Follow up plan: No follow-ups on file.  [name], MD [place]

## 2019-04-06 ENCOUNTER — Other Ambulatory Visit: Payer: Self-pay

## 2019-04-06 ENCOUNTER — Ambulatory Visit: Payer: Medicare Other | Admitting: *Deleted

## 2019-04-06 DIAGNOSIS — L308 Other specified dermatitis: Secondary | ICD-10-CM

## 2019-04-06 DIAGNOSIS — G809 Cerebral palsy, unspecified: Secondary | ICD-10-CM

## 2019-04-06 NOTE — Patient Outreach (Signed)
[facility]) Care Management  04/06/2019  Jahleel Whittington Francesconi 19-Mar-1956 [id number]   Transportation arranged via [place] for appointment at [place] on 04/10/19 @ 1:55PM.  [name], [place] Social Worker [phone]

## 2019-04-07 ENCOUNTER — Other Ambulatory Visit: Payer: Self-pay

## 2019-04-07 NOTE — Patient Instructions (Signed)
Visit Information  Goals Addressed            This Visit's Progress     Patient Stated   . "I want this rash to go away" (pt-stated)       Current Barriers:  [name] Knowledge Deficits related to cause of rash and proper treatment  Nurse Case Manager Clinical Goal(s):  [name] Over the next 7 days, patient will keep appointment with Dr Livia Snellen for evaluation of his rash.  Interventions:  . Discussed HPI . Chart reviewed . Upcoming appointments reviewed . Discussed arranging transportation to appointment with Dr Livia Snellen on 04/10/2019 . Contacted [name] to help with transportation assistance  Patient Self Care Activities:  . Self administers medications as prescribed . Calls provider office for new concerns or questions . [place] and delivers medications . He has help from his sisters with IADLs  Please see past updates related to this goal by clicking on the "Past Updates" button in the selected goal       The care management team will reach out to the patient again over the next 30- days.   The patient verbalized understanding of instructions provided today and declined a print copy of patient instruction materials.    [name], BSN, RN-BC Embedded Chronic Care Manager [facility] / [place] Management Direct Dial: [phone]

## 2019-04-07 NOTE — Chronic Care Management (AMB) (Signed)
Chronic Care Management   Follow Up Note   04/06/2019 Name: [name] MRN: [id number] DOB: [birth year]  Referred by: [name], MD Reason for referral : Chronic Care Management (Care Coordination)   [name] is a 63 y.o. year old male who is a primary care patient of [name], [name], MD. The CCM team was consulted for assistance with chronic disease management and care coordination needs.    Review of patient status, including review of consultants reports, relevant laboratory and other test results, and collaboration with appropriate care team members and the patient's provider was performed as part of comprehensive patient evaluation and provision of chronic care management services.  I spoke with patient by telephone today. He would like transportation arranged for his f/u visit with Dr Livia Snellen on 04/10/2019.    Outpatient Encounter Medications as of 04/06/2019  Medication Sig  . acetaminophen (TYLENOL) 325 MG tablet Take 2 tablets (650 mg total) by mouth every 6 (six) hours as needed for mild pain or headache.  . albuterol (PROAIR HFA) 108 (90 Base) MCG/ACT inhaler 1-2 puffs every 6 hours as needed wheezing or shortness of breath.  [name] aspirin EC 81 MG tablet Take 1 tablet (81 mg total) by mouth daily.  [name] atorvastatin (LIPITOR) 40 MG tablet TAKE 1 TABLET ONCE DAILY AT 6PM  . buPROPion (WELLBUTRIN SR) 150 MG 12 hr tablet Take 1 tablet (150 mg total) by mouth daily with breakfast.  . celecoxib (CELEBREX) 200 MG capsule Take 200 mg by mouth daily as needed for pain.  . cetaphil (CETAPHIL) lotion Apply 1 application topically 2 (two) times daily. For rash. Apply after washing with [facility] shower gel and rinsing with clear warm water.  . COMBIVENT RESPIMAT 20-100 MCG/ACT AERS respimat Inhale 1 puff into the lungs every 6 (six) hours  . DULoxetine (CYMBALTA) 30 MG capsule Take 2 capsules (60 mg total) by mouth daily. WITH A FULL STOMACH AT SUPPER TIME  . fexofenadine  (ALLEGRA) 180 MG tablet Take 1 tablet (180 mg total) by mouth daily. For allergy symptoms  . fluocinonide-emollient (LIDEX-E) 0.05 % cream Apply 1 application topically 2 (two) times daily. To affected areas  . fluticasone (FLONASE) 50 MCG/ACT nasal spray Place 2 sprays into both nostrils daily as needed for allergies or rhinitis.  . fluticasone furoate-vilanterol (BREO ELLIPTA) 100-25 MCG/INH AEPB Inhale 1 puff into the lungs daily.  . isosorbide mononitrate (IMDUR) 30 MG 24 hr tablet Take 1 tablet (30 mg total) by mouth daily.  . metoprolol tartrate (LOPRESSOR) 25 MG tablet Take 1/2 tablet by mouth two times a day and include in pill packs  . mirtazapine (REMERON) 15 MG tablet Take 1 tablet (15 mg total) by mouth at bedtime. For sleep  . nitroGLYCERIN (NITROSTAT) 0.4 MG SL tablet Place 1 tablet (0.4 mg total) under the tongue every 5 (five) minutes x 3 doses as needed for chest pain.  . pantoprazole (PROTONIX) 40 MG tablet TAKE  (1)  TABLET TWICE A DAY.  [name] predniSONE (DELTASONE) 10 MG tablet Take 5 daily for 2 days followed by 4,3,2 and 1 for 2 days each.  . tamsulosin (FLOMAX) 0.4 MG CAPS capsule TAKE (1) CAPSULE DAILY  . triamcinolone cream (KENALOG) 0.1 % Apply 1 application topically 3 (three) times daily. Avoid face and genitalia   Facility-Administered Encounter Medications as of 04/06/2019  Medication  . cyanocobalamin ((VITAMIN B-12)) injection 1,000 mcg     RN Care Plan     Patient Stated   . "  I want this rash to go away" (pt-stated)       Current Barriers:  [name] Knowledge Deficits related to cause of rash and proper treatment  Nurse Case Manager Clinical Goal(s):  [name] Over the next 7 days, patient will keep appointment with Dr Livia Snellen for evaluation of his rash.  Interventions:  . Discussed HPI . Chart reviewed . Upcoming appointments reviewed . Discussed arranging transportation to appointment with Dr Livia Snellen on 04/10/2019 . Contacted [name] to help with transportation assistance  for patient who has cerebral palsy  Patient Self Care Activities:  . Self administers medications as prescribed . Calls provider office for new concerns or questions . 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 He has help from his sisters with IADLs  Please see past updates related to this goal by clicking on the "Past Updates" button in the selected goal          The care management team will reach out to the patient again over the next 30 days.   [name], BSN, RN-BC Embedded Chronic Care Manager [facility] / [place] [facility] Direct Dial: [phone]

## 2019-04-10 ENCOUNTER — Other Ambulatory Visit: Payer: Self-pay

## 2019-04-10 ENCOUNTER — Other Ambulatory Visit: Payer: Self-pay | Admitting: Family Medicine

## 2019-04-10 ENCOUNTER — Ambulatory Visit (INDEPENDENT_AMBULATORY_CARE_PROVIDER_SITE_OTHER): Payer: Medicare Other | Admitting: Family Medicine

## 2019-04-10 ENCOUNTER — Encounter: Payer: Self-pay | Admitting: Family Medicine

## 2019-04-10 VITALS — BP 126/73 | HR 90 | Temp 98.0°F | Resp 20 | Ht 73.0 in | Wt 244.0 lb

## 2019-04-10 DIAGNOSIS — J441 Chronic obstructive pulmonary disease with (acute) exacerbation: Secondary | ICD-10-CM

## 2019-04-10 DIAGNOSIS — E538 Deficiency of other specified B group vitamins: Secondary | ICD-10-CM | POA: Diagnosis not present

## 2019-04-10 DIAGNOSIS — G809 Cerebral palsy, unspecified: Secondary | ICD-10-CM

## 2019-04-10 DIAGNOSIS — F411 Generalized anxiety disorder: Secondary | ICD-10-CM

## 2019-04-10 DIAGNOSIS — N401 Enlarged prostate with lower urinary tract symptoms: Secondary | ICD-10-CM

## 2019-04-10 DIAGNOSIS — F3341 Major depressive disorder, recurrent, in partial remission: Secondary | ICD-10-CM

## 2019-04-10 DIAGNOSIS — J431 Panlobular emphysema: Secondary | ICD-10-CM

## 2019-04-10 DIAGNOSIS — I10 Essential (primary) hypertension: Secondary | ICD-10-CM

## 2019-04-10 DIAGNOSIS — E785 Hyperlipidemia, unspecified: Secondary | ICD-10-CM

## 2019-04-10 DIAGNOSIS — J440 Chronic obstructive pulmonary disease with acute lower respiratory infection: Secondary | ICD-10-CM | POA: Diagnosis not present

## 2019-04-10 DIAGNOSIS — R351 Nocturia: Secondary | ICD-10-CM

## 2019-04-10 DIAGNOSIS — I25118 Atherosclerotic heart disease of native coronary artery with other forms of angina pectoris: Secondary | ICD-10-CM

## 2019-04-10 MED ORDER — MIRTAZAPINE 15 MG PO TABS: 15.0000 mg | ORAL_TABLET | Freq: Every day | ORAL | 5 refills | Status: DC

## 2019-04-10 MED ORDER — TAMSULOSIN HCL 0.4 MG PO CAPS: 0.8000 mg | ORAL_CAPSULE | Freq: Every day | ORAL | 3 refills | Status: DC

## 2019-04-10 MED ORDER — FEXOFENADINE HCL 180 MG PO TABS: 180.0000 mg | ORAL_TABLET | Freq: Every day | ORAL | 2 refills | Status: DC

## 2019-04-10 MED ORDER — BREO ELLIPTA 100-25 MCG/INH IN AEPB: INHALATION_SPRAY | RESPIRATORY_TRACT | 0 refills | Status: DC

## 2019-04-10 MED ORDER — DULOXETINE HCL 30 MG PO CPEP: 60.0000 mg | ORAL_CAPSULE | Freq: Every day | ORAL | 0 refills | Status: DC

## 2019-04-10 MED ORDER — CELECOXIB 200 MG PO CAPS: 200.0000 mg | ORAL_CAPSULE | Freq: Every day | ORAL | 1 refills | Status: DC | PRN

## 2019-04-10 MED ORDER — PANTOPRAZOLE SODIUM 40 MG PO TBEC: DELAYED_RELEASE_TABLET | ORAL | 0 refills | Status: DC

## 2019-04-10 MED ORDER — FLUTICASONE PROPIONATE 50 MCG/ACT NA SUSP: 2.0000 | Freq: Every day | NASAL | 1 refills | Status: DC | PRN

## 2019-04-10 MED ORDER — METOPROLOL TARTRATE 25 MG PO TABS: 25.0000 mg | ORAL_TABLET | Freq: Two times a day (BID) | ORAL | 5 refills | Status: DC

## 2019-04-10 MED ORDER — BUPROPION HCL ER (SR) 150 MG PO TB12: 150.0000 mg | ORAL_TABLET | Freq: Every day | ORAL | 1 refills | Status: DC

## 2019-04-10 MED ORDER — ALBUTEROL SULFATE HFA 108 (90 BASE) MCG/ACT IN AERS: INHALATION_SPRAY | RESPIRATORY_TRACT | 10 refills | Status: DC

## 2019-04-10 MED ORDER — ATORVASTATIN CALCIUM 40 MG PO TABS: ORAL_TABLET | ORAL | 1 refills | Status: DC

## 2019-04-10 NOTE — Progress Notes (Signed)
Subjective:  Patient ID: [name], male    DOB: 1956/01/06  Age: 63 y.o. MRN: [id number]  CC: Medical Management of Chronic Issues   HPI Shehab Browne Cullom presents for  presents for  follow-up of hypertension. Patient has no history of headache chest pain or shortness of breath or recent cough. Patient also denies symptoms of TIA such as focal numbness or weakness. Patient denies side effects from medication. States taking it regularly.   in for follow-up of elevated cholesterol. Doing well without complaints on current medication. Denies side effects of statin including myalgia and arthralgia and nausea. Currently no chest pain, shortness of breath or other cardiovascular related symptoms noted.  Continues to get B12 injections monthly. States they help him feel better.  For COPD has albuterol and combivent plus using breo daily. Denies recent dyspnea atack.  Sleeping adequate with the mirtazapine  Patient in for follow-up of GERD. Currently asymptomatic taking  PPI daily. There is no chest pain or heartburn. No hematemesis and no melena. No dysphagia or choking. Onset is remote. Progression is stable. Complicating factors, none.    GAD 7 : Generalized Anxiety Score 04/10/2019 05/01/2016 03/31/2016  Nervous, Anxious, on Edge 0 1 3  Control/stop worrying 0 1 3  Worry too much - different things 0 1 3  Trouble relaxing 0 0 2  Restless 0 0 2  Easily annoyed or irritable 0 0 0  Afraid - awful might happen 0 0 0  Total GAD 7 Score 0 3 13  Anxiety Difficulty Not difficult at all Not difficult at all Not difficult at all     Depression screen [facility] 2/9 04/10/2019 02/23/2019 11/01/2018  Decreased Interest 0 2 0  Down, Depressed, Hopeless 0 2 0  PHQ - 2 Score 0 4 0  Altered sleeping - 0 -  Tired, decreased energy - 1 -  Change in appetite - 2 -  Feeling bad or failure about yourself  - 1 -  Trouble concentrating - 1 -  Moving slowly or fidgety/restless - 0 -  Suicidal thoughts - 0  -  PHQ-9 Score - 9 -  Difficult doing work/chores - Very difficult -  Some recent data might be hidden    History Kaigen has a past medical history of Cerebral palsy ([place]), Coronary artery disease, Depression, GERD (gastroesophageal reflux disease), Hypertension, Scoliosis, and Seizures ([place]).   He has a past surgical history that includes Coronary angioplasty with stent; Carpal tunnel release (Right, 08/13/2016); and LEFT HEART CATH AND CORONARY ANGIOGRAPHY (N/A, 07/10/2017).   His family history includes Alcohol abuse in his brother; CAD in his brother and sister; CAD (age of onset: 12) in his father; Diabetes in his mother; Heart disease in his father; Heart failure in his sister.He reports that he quit smoking about 10 months ago. His smoking use included cigarettes. He has a 20.50 pack-year smoking history. He has never used smokeless tobacco. He reports current alcohol use. He reports that he does not use drugs.    ROS Review of Systems  Constitutional: Negative.   HENT: Negative.   Eyes: Negative for visual disturbance.  Respiratory: Negative for cough and shortness of breath.   Cardiovascular: Negative for chest pain and leg swelling.  Gastrointestinal: Negative for abdominal pain, diarrhea, nausea and vomiting.  Genitourinary: Positive for frequency (with nocturia). Negative for difficulty urinating.  Musculoskeletal: Negative for arthralgias and myalgias.  Skin: Negative for rash.  Neurological: Negative for headaches.  Psychiatric/Behavioral: Negative for sleep disturbance.  Objective:  BP 126/73   Pulse 90   Temp 98 F (36.7 C) (Temporal)   Resp 20   Ht 6\' 1"  (1.854 m)   Wt 244 lb (110.7 kg)   SpO2 96%   BMI 32.19 kg/m   BP Readings from Last 3 Encounters:  04/10/19 126/73  03/24/19 135/75  11/01/18 139/85    Wt Readings from Last 3 Encounters:  04/10/19 244 lb (110.7 kg)  03/24/19 243 lb 6.4 oz (110.4 kg)  02/21/19 241 lb (109.3 kg)     Physical  Exam Vitals reviewed.  Constitutional:      Appearance: He is well-developed.  HENT:     Head: Normocephalic and atraumatic.     Right Ear: Tympanic membrane and external ear normal. No decreased hearing noted.     Left Ear: Tympanic membrane and external ear normal. No decreased hearing noted.     Mouth/Throat:     Pharynx: No oropharyngeal exudate or posterior oropharyngeal erythema.  Eyes:     Pupils: Pupils are equal, round, and reactive to light.  Cardiovascular:     Rate and Rhythm: Normal rate and regular rhythm.     Heart sounds: No murmur.  Pulmonary:     Effort: No respiratory distress.     Breath sounds: Normal breath sounds.  Abdominal:     General: Bowel sounds are normal.     Palpations: Abdomen is soft. There is no mass.     Tenderness: There is no abdominal tenderness.  Musculoskeletal:     Cervical back: Normal range of motion and neck supple.  Neurological:     Motor: Weakness (left arm, baseline & stable) present.       Assessment & Plan:   Pedro was seen today for medical management of chronic issues.  Diagnoses and all orders for this visit:  B12 deficiency -     CBC -     CMP -     Vitamin B12 -     Folate  Essential hypertension -     metoprolol tartrate (LOPRESSOR) 25 MG tablet; Take 1 tablet (25 mg total) by mouth 2 (two) times daily. -     CBC -     CMP  Coronary artery disease of native artery of native heart with stable angina pectoris (HCC) -     atorvastatin (LIPITOR) 40 MG tablet; TAKE 1 TABLET ONCE DAILY AT 6PM -     metoprolol tartrate (LOPRESSOR) 25 MG tablet; Take 1 tablet (25 mg total) by mouth 2 (two) times daily. -     CBC -     CMP  COPD exacerbation (HCC) -     CBC -     CMP  Panlobular emphysema (HCC) -     albuterol (PROAIR HFA) 108 (90 Base) MCG/ACT inhaler; 1-2 puffs every 6 hours as needed wheezing or shortness of breath. -     fluticasone (FLONASE) 50 MCG/ACT nasal spray; Place 2 sprays into both nostrils  daily as needed for allergies or rhinitis. -     fluticasone furoate-vilanterol (BREO ELLIPTA) 100-25 MCG/INH AEPB; Inhale 1 puff into the lungs daily. -     CBC -     CMP  Infantile cerebral palsy (HCC) -     CBC -     CMP  Dyslipidemia -     CBC -     CMP -     Lipid  Recurrent major depressive disorder, in partial remission ([place]) -  buPROPion (WELLBUTRIN SR) 150 MG 12 hr tablet; Take 1 tablet (150 mg total) by mouth daily with breakfast. -     DULoxetine (CYMBALTA) 30 MG capsule; Take 2 capsules (60 mg total) by mouth daily. WITH A FULL STOMACH AT SUPPER TIME -     CBC -     CMP  GAD (generalized anxiety disorder) -     DULoxetine (CYMBALTA) 30 MG capsule; Take 2 capsules (60 mg total) by mouth daily. WITH A FULL STOMACH AT SUPPER TIME -     CBC -     CMP  Benign prostatic hyperplasia with nocturia -     tamsulosin (FLOMAX) 0.4 MG CAPS capsule; Take 2 capsules (0.8 mg total) by mouth daily after supper. -     CBC -     CMP -     PSA Total (Reflex To Free)  Other orders -     celecoxib (CELEBREX) 200 MG capsule; Take 1 capsule (200 mg total) by mouth daily as needed. -     fexofenadine (ALLEGRA) 180 MG tablet; Take 1 tablet (180 mg total) by mouth daily. For allergy symptoms -     mirtazapine (REMERON) 15 MG tablet; Take 1 tablet (15 mg total) by mouth at bedtime. For sleep -     pantoprazole (PROTONIX) 40 MG tablet; TAKE  (1)  TABLET TWICE A DAY.       I have discontinued Rosetta Vieth. Carriveau's predniSONE and triamcinolone cream. I have also changed his celecoxib, metoprolol tartrate, and tamsulosin. Additionally, I am having him maintain his acetaminophen, aspirin EC, isosorbide mononitrate, nitroGLYCERIN, Combivent Respimat, fluocinonide-emollient, cetaphil, albuterol, atorvastatin, buPROPion, DULoxetine, fexofenadine, fluticasone, Breo Ellipta, mirtazapine, and pantoprazole. We will continue to administer cyanocobalamin.  Allergies as of 04/10/2019      Reactions    Chantix [varenicline Tartrate] Nausea And Vomiting      Medication List       Accurate as of April 10, 2019  5:46 PM. If you have any questions, ask your nurse or doctor.        STOP taking these medications   predniSONE 10 MG tablet Commonly known as: DELTASONE Stopped by: [name], MD   triamcinolone cream 0.1 % Commonly known as: KENALOG Stopped by: [name], MD     TAKE these medications   acetaminophen 325 MG tablet Commonly known as: TYLENOL Take 2 tablets (650 mg total) by mouth every 6 (six) hours as needed for mild pain or headache.   albuterol 108 (90 Base) MCG/ACT inhaler Commonly known as: ProAir HFA 1-2 puffs every 6 hours as needed wheezing or shortness of breath.   aspirin EC 81 MG tablet Take 1 tablet (81 mg total) by mouth daily.   atorvastatin 40 MG tablet Commonly known as: LIPITOR TAKE 1 TABLET ONCE DAILY AT 6PM   Breo Ellipta 100-25 MCG/INH Aepb Generic drug: fluticasone furoate-vilanterol Inhale 1 puff into the lungs daily.   buPROPion 150 MG 12 hr tablet Commonly known as: WELLBUTRIN SR Take 1 tablet (150 mg total) by mouth daily with breakfast.   celecoxib 200 MG capsule Commonly known as: CELEBREX Take 1 capsule (200 mg total) by mouth daily as needed. What changed: reasons to take this Changed by: [name], MD   cetaphil lotion Apply 1 application topically 2 (two) times daily. For rash. Apply after washing with [facility] shower gel and rinsing with clear warm water.   Combivent Respimat 20-100 MCG/ACT Aers respimat Generic drug: Ipratropium-Albuterol Inhale 1 puff into  the lungs every 6 (six) hours   DULoxetine 30 MG capsule Commonly known as: CYMBALTA Take 2 capsules (60 mg total) by mouth daily. WITH A FULL STOMACH AT SUPPER TIME   fexofenadine 180 MG tablet Commonly known as: ALLEGRA Take 1 tablet (180 mg total) by mouth daily. For allergy symptoms   fluocinonide-emollient 0.05 % cream Commonly known  as: LIDEX-E Apply 1 application topically 2 (two) times daily. To affected areas   fluticasone 50 MCG/ACT nasal spray Commonly known as: FLONASE Place 2 sprays into both nostrils daily as needed for allergies or rhinitis.   isosorbide mononitrate 30 MG 24 hr tablet Commonly known as: IMDUR Take 1 tablet (30 mg total) by mouth daily.   metoprolol tartrate 25 MG tablet Commonly known as: LOPRESSOR Take 1 tablet (25 mg total) by mouth 2 (two) times daily. What changed: See the new instructions. Changed by: [name], MD   mirtazapine 15 MG tablet Commonly known as: Remeron Take 1 tablet (15 mg total) by mouth at bedtime. For sleep   nitroGLYCERIN 0.4 MG SL tablet Commonly known as: NITROSTAT Place 1 tablet (0.4 mg total) under the tongue every 5 (five) minutes x 3 doses as needed for chest pain.   pantoprazole 40 MG tablet Commonly known as: PROTONIX TAKE  (1)  TABLET TWICE A DAY.   tamsulosin 0.4 MG Caps capsule Commonly known as: FLOMAX Take 2 capsules (0.8 mg total) by mouth daily after supper. What changed: See the new instructions. Changed by: [name], MD        Follow-up: Return in about 3 months (around 07/09/2019).  [name], M.D.

## 2019-04-12 LAB — CMP14+EGFR
ALT: 15 IU/L (ref 0–44)
AST: 17 IU/L (ref 0–40)
Albumin/Globulin Ratio: 1.5 (ref 1.2–2.2)
Albumin: 4.1 g/dL (ref 3.8–4.8)
Alkaline Phosphatase: 88 IU/L (ref 39–117)
BUN/Creatinine Ratio: 10 (ref 10–24)
BUN: 9 mg/dL (ref 8–27)
Bilirubin Total: 0.5 mg/dL (ref 0.0–1.2)
CO2: 29 mmol/L (ref 20–29)
Calcium: 9.5 mg/dL (ref 8.6–10.2)
Chloride: 100 mmol/L (ref 96–106)
Creatinine, Ser: 0.86 mg/dL (ref 0.76–1.27)
GFR calc Af Amer: 108 mL/min/{1.73_m2} (ref >59)
GFR calc non Af Amer: 94 mL/min/{1.73_m2} (ref >59)
Globulin, Total: 2.8 g/dL (ref 1.5–4.5)
Glucose: 84 mg/dL (ref 65–99)
Potassium: 4.7 mmol/L (ref 3.5–5.2)
Sodium: 144 mmol/L (ref 134–144)
Total Protein: 6.9 g/dL (ref 6.0–8.5)

## 2019-04-12 LAB — FOLATE: Folate: 3.4 ng/mL (ref >3.0)

## 2019-04-12 LAB — CBC WITH DIFFERENTIAL/PLATELET
Basophils Absolute: 0.1 10*3/uL (ref 0.0–0.2)
Basos: 1 %
EOS (ABSOLUTE): 1 10*3/uL — ABNORMAL HIGH (ref 0.0–0.4)
Eos: 9 %
Hematocrit: 45.2 % (ref 37.5–51.0)
Hemoglobin: 15.9 g/dL (ref 13.0–17.7)
Immature Grans (Abs): 0 10*3/uL (ref 0.0–0.1)
Immature Granulocytes: 0 %
Lymphocytes Absolute: 3.8 10*3/uL — ABNORMAL HIGH (ref 0.7–3.1)
Lymphs: 34 %
MCH: 35.5 pg — ABNORMAL HIGH (ref 26.6–33.0)
MCHC: 35.2 g/dL (ref 31.5–35.7)
MCV: 101 fL — ABNORMAL HIGH (ref 79–97)
Monocytes Absolute: 0.7 10*3/uL (ref 0.1–0.9)
Monocytes: 7 %
Neutrophils Absolute: 5.4 10*3/uL (ref 1.4–7.0)
Neutrophils: 49 %
Platelets: 226 10*3/uL (ref 150–450)
RBC: 4.48 x10E6/uL (ref 4.14–5.80)
RDW: 12.5 % (ref 11.6–15.4)
WBC: 11.2 10*3/uL — ABNORMAL HIGH (ref 3.4–10.8)

## 2019-04-12 LAB — LIPID PANEL
Chol/HDL Ratio: 3.5 ratio (ref 0.0–5.0)
Cholesterol, Total: 109 mg/dL (ref 100–199)
HDL: 31 mg/dL — ABNORMAL LOW (ref >39)
LDL Chol Calc (NIH): 44 mg/dL (ref 0–99)
Triglycerides: 212 mg/dL — ABNORMAL HIGH (ref 0–149)
VLDL Cholesterol Cal: 34 mg/dL (ref 5–40)

## 2019-04-12 LAB — VITAMIN B12: Vitamin B-12: 380 pg/mL (ref 232–1245)

## 2019-04-12 LAB — PSA TOTAL (REFLEX TO FREE): Prostate Specific Ag, Serum: 0.5 ng/mL (ref 0.0–4.0)

## 2019-04-19 ENCOUNTER — Other Ambulatory Visit: Payer: Self-pay

## 2019-04-19 NOTE — Patient Outreach (Signed)
[facility]) Care Management  04/19/2019  Timberland Zehrung [place] April 25, 1955 [id number]   Transportation arranged via [place] for appointment at [facility] [place] on 04/25/19 @ [place], [place] Worker [phone]

## 2019-04-24 ENCOUNTER — Telehealth: Payer: Self-pay | Admitting: Family Medicine

## 2019-04-24 ENCOUNTER — Ambulatory Visit (INDEPENDENT_AMBULATORY_CARE_PROVIDER_SITE_OTHER): Payer: Medicare Other | Admitting: Licensed Clinical Social Worker

## 2019-04-24 DIAGNOSIS — F411 Generalized anxiety disorder: Secondary | ICD-10-CM

## 2019-04-24 DIAGNOSIS — M5136 Other intervertebral disc degeneration, lumbar region: Secondary | ICD-10-CM

## 2019-04-24 DIAGNOSIS — F32A Depression, unspecified: Secondary | ICD-10-CM

## 2019-04-24 DIAGNOSIS — F329 Major depressive disorder, single episode, unspecified: Secondary | ICD-10-CM

## 2019-04-24 DIAGNOSIS — I25118 Atherosclerotic heart disease of native coronary artery with other forms of angina pectoris: Secondary | ICD-10-CM

## 2019-04-24 DIAGNOSIS — J441 Chronic obstructive pulmonary disease with (acute) exacerbation: Secondary | ICD-10-CM

## 2019-04-24 DIAGNOSIS — I10 Essential (primary) hypertension: Secondary | ICD-10-CM

## 2019-04-24 DIAGNOSIS — R482 Apraxia: Secondary | ICD-10-CM

## 2019-04-24 NOTE — Patient Instructions (Addendum)
Licensed Clinical Social Worker Visit Information  Goals we discussed today:  Goals Addressed            This Visit's Progress   . Improve Insufficient Self Care Secondary to Grief (pt-stated)       Current Barriers:  [name] Grief reaction to sister's death in patient with Chronic Diagnoses of COPD, Apaxia of Speech, CAD, HTN, GAD, DDD, Depression . depression  Nurse Case Manager Clinical Goal(s):  [name] Over the next 30 days, patient will continue to talk with [name] regarding grief secondary to sister's death . Over the next 30 days, patient will work with RN to improve oral intake in presence of decreased appetite secondary to grief process  Interventions:   RNCM has encouraged patient to continue talking with [name] [name] talked with client about grief issues related to death of his older sister [name] provided counseling support for client [name] talked with client about social support network (sister)  Patient Self Care Activities:  . Performs ADL's independently . Unable to perform IADLs independently. Has not have transportation arranged.   Initial goal documentation        Materials Provided:  No  Follow Up Plan: [name] to call client in next 4 weeks to talk with client about client's management of grief issues experienced by client  The patient verbalized understanding of instructions provided today and declined a print copy of patient instruction materials.   [name].Layan Zalenski MSW, [name] Licensed Clinical Social Worker [facility] Care Management [phone]

## 2019-04-24 NOTE — Chronic Care Management (AMB) (Signed)
  Care Management Note   [name] is a 64 y.o. year old male who is a primary care patient of [name], [name], MD. The CM team was consulted for assistance with chronic disease management and care coordination.   I reached out to [name] by phone today.     Review of patient status, including review of consultants reports, relevant laboratory and other test results, and collaboration with appropriate care team members and the patient's provider was performed as part of comprehensive patient evaluation and provision of chronic care management services.   Social determinants of health: risk of social isolation; risk of tobacco use; risk of depression    Chronic Care Management from 02/21/2019 in [place]  PHQ-9 Total Score  9     GAD 7 : Generalized Anxiety Score 04/10/2019 05/01/2016 03/31/2016  Nervous, Anxious, on Edge 0 1 3  Control/stop worrying 0 1 3  Worry too much - different things 0 1 3  Trouble relaxing 0 0 2  Restless 0 0 2  Easily annoyed or irritable 0 0 0  Afraid - awful might happen 0 0 0  Total GAD 7 Score 0 3 13  Anxiety Difficulty Not difficult at all Not difficult at all Not difficult at all   Medications   Outpatient Medications acetaminophen (TYLENOL) 325 MG tablet albuterol (PROAIR HFA) 108 (90 Base) MCG/ACT inhaler aspirin EC 81 MG tablet atorvastatin (LIPITOR) 40 MG tablet buPROPion (WELLBUTRIN SR) 150 MG 12 hr tablet celecoxib (CELEBREX) 200 MG capsule cetaphil (CETAPHIL) lotion COMBIVENT RESPIMAT 20-100 MCG/ACT AERS respimat DULoxetine (CYMBALTA) 30 MG capsule fexofenadine (ALLEGRA) 180 MG tablet fluocinonide-emollient (LIDEX-E) 0.05 % cream fluticasone (FLONASE) 50 MCG/ACT nasal spray fluticasone furoate-vilanterol (BREO ELLIPTA) 100-25 MCG/INH AEPB isosorbide mononitrate (IMDUR) 30 MG 24 hr tablet metoprolol tartrate (LOPRESSOR) 25 MG tablet mirtazapine (REMERON) 15 MG tablet nitroGLYCERIN (NITROSTAT) 0.4 MG  SL tablet pantoprazole (PROTONIX) 40 MG tablet tamsulosin (FLOMAX) 0.4 MG CAPS capsule  Clinic-Administered Medications cyanocobalamin ((VITAMIN B-12)) injection 1,000 mcg  Goals Addressed            This Visit's Progress   . Improve Insufficient Self Care Secondary to Grief (pt-stated)       Current Barriers:  [name] Grief reaction to sister's death in patient with Chronic Diagnoses of COPD, Apaxia of Speech, CAD, HTN, GAD, DDD, Depression . depression  Nurse Case Manager Clinical Goal(s):  [name] Over the next 30 days, patient will continue to talk with [name] regarding grief secondary to sister's death . Over the next 30 days, patient will work with RN to improve oral intake in presence of decreased appetite secondary to grief process  Interventions:  RNCM has encouraged patient to continue talking with [name] [name] talked with client about grief issues related to death of his older sister [name] provided counseling support for client [name] talked with client about social support network (sister)  Patient Self Care Activities:  . Performs ADL's independently . Unable to perform IADLs independently. Has not have transportation arranged.   Initial goal documentation       Follow Up Plan: [name] to call client in next 4 weeks to talk with client about client's management of grief symptoms faced  Senna Stotz.Kaysi Ourada MSW, [name] Licensed Clinical Social Worker [facility] Care Management [phone]

## 2019-04-25 ENCOUNTER — Ambulatory Visit: Payer: Medicare Other | Admitting: *Deleted

## 2019-04-25 ENCOUNTER — Other Ambulatory Visit: Payer: Self-pay

## 2019-04-25 ENCOUNTER — Ambulatory Visit (INDEPENDENT_AMBULATORY_CARE_PROVIDER_SITE_OTHER): Payer: Medicare Other | Admitting: *Deleted

## 2019-04-25 DIAGNOSIS — E538 Deficiency of other specified B group vitamins: Secondary | ICD-10-CM

## 2019-04-25 DIAGNOSIS — G809 Cerebral palsy, unspecified: Secondary | ICD-10-CM

## 2019-04-25 DIAGNOSIS — L309 Dermatitis, unspecified: Secondary | ICD-10-CM

## 2019-04-25 DIAGNOSIS — I10 Essential (primary) hypertension: Secondary | ICD-10-CM

## 2019-04-25 NOTE — Chronic Care Management (AMB) (Signed)
Chronic Care Management   Follow Up Note   04/25/2019 Name: DEMARIEN SERPE MRN: [id number] DOB: [birth year]  Referred by: [name], MD Reason for referral : Chronic Care Management (follow-up)   [name] is a 64 y.o. year old male who is a primary care patient of [name], [name], MD. The CCM team was consulted for assistance with chronic disease management and care coordination needs.    Review of patient status, including review of consultants reports, relevant laboratory and other test results, and collaboration with appropriate care team members and the patient's provider was performed as part of comprehensive patient evaluation and provision of chronic care management services.     Outpatient Encounter Medications as of 04/25/2019  Medication Sig  . acetaminophen (TYLENOL) 325 MG tablet Take 2 tablets (650 mg total) by mouth every 6 (six) hours as needed for mild pain or headache.  . albuterol (PROAIR HFA) 108 (90 Base) MCG/ACT inhaler 1-2 puffs every 6 hours as needed wheezing or shortness of breath.  [name] aspirin EC 81 MG tablet Take 1 tablet (81 mg total) by mouth daily.  [name] atorvastatin (LIPITOR) 40 MG tablet TAKE 1 TABLET ONCE DAILY AT 6PM  . buPROPion (WELLBUTRIN SR) 150 MG 12 hr tablet Take 1 tablet (150 mg total) by mouth daily with breakfast.  . celecoxib (CELEBREX) 200 MG capsule Take 1 capsule (200 mg total) by mouth daily as needed.  . cetaphil (CETAPHIL) lotion Apply 1 application topically 2 (two) times daily. For rash. Apply after washing with [facility] shower gel and rinsing with clear warm water.  . COMBIVENT RESPIMAT 20-100 MCG/ACT AERS respimat Inhale 1 puff into the lungs every 6 (six) hours  . DULoxetine (CYMBALTA) 30 MG capsule Take 2 capsules (60 mg total) by mouth daily. WITH A FULL STOMACH AT SUPPER TIME  . fexofenadine (ALLEGRA) 180 MG tablet Take 1 tablet (180 mg total) by mouth daily. For allergy symptoms  . fluocinonide-emollient (LIDEX-E) 0.05 %  cream Apply 1 application topically 2 (two) times daily. To affected areas  . fluticasone (FLONASE) 50 MCG/ACT nasal spray Place 2 sprays into both nostrils daily as needed for allergies or rhinitis.  . fluticasone furoate-vilanterol (BREO ELLIPTA) 100-25 MCG/INH AEPB Inhale 1 puff into the lungs daily.  . isosorbide mononitrate (IMDUR) 30 MG 24 hr tablet TAKE 1 TABLET DAILY  . metoprolol tartrate (LOPRESSOR) 25 MG tablet Take 1 tablet (25 mg total) by mouth 2 (two) times daily.  . mirtazapine (REMERON) 15 MG tablet Take 1 tablet (15 mg total) by mouth at bedtime. For sleep  . nitroGLYCERIN (NITROSTAT) 0.4 MG SL tablet Place 1 tablet (0.4 mg total) under the tongue every 5 (five) minutes x 3 doses as needed for chest pain.  . pantoprazole (PROTONIX) 40 MG tablet TAKE  (1)  TABLET TWICE A DAY.  . tamsulosin (FLOMAX) 0.4 MG CAPS capsule Take 2 capsules (0.8 mg total) by mouth daily after supper.   Facility-Administered Encounter Medications as of 04/25/2019  Medication  . cyanocobalamin ((VITAMIN B-12)) injection 1,000 mcg    RN Assessment & Care Plan           This Visit's Progress     Patient Stated   . "I need transportation to come in and get my B12 shot" (pt-stated)       Transportation needs in patient with infantile cerebral palsy, hypertension, and hx of MI  Current Barriers:  . Film/video editor.  . Literacy barriers . Transportation barriers  Nurse  Case Manager Clinical Goal(s):  [name] Over the next 14 days, patient will have transportation arranged for appointment with Dr Livia Snellen on 05/16/19 at 1:55  Interventions:  . Reviewed appointment information with patient . Outreach to [employer] to arrange transportation to appointment  Patient Self Care Activities:  . Performs most ADLs but does have in home Aide and has help form his sisters for most all IADLs   Please see past updates related to this goal by clicking on the "Past Updates" button in the selected goal        . "I want this rash to go away" (pt-stated)       Current Barriers:  . Chronic Disease Management support and education needs related to eczema  Nurse Case Manager Clinical Goal(s):  [name] Over the next 30 days, patient will continue to apply Rx cream . Over the next 30 days, patient will continue to see improvement in eczema rash  Interventions:  . Discussed HPI . Chart reviewed . Encouraged patient to continue using Rx cream . Advised patient to reach out to RN or PCP if rash worsens or does not resolve  Patient Self Care Activities:  . Self administers medications as prescribed . Calls provider office for new concerns or questions . Kim and delivers medications . He has help from his sisters with IADLs  Please see past updates related to this goal by clicking on the "Past Updates" button in the selected goal          Follow-up Plan The care management team will reach out to the patient again over the next 30 days.    [name], BSN, RN-BC Embedded Chronic Care Manager [facility] [place] Management Direct Dial: [phone]

## 2019-04-25 NOTE — Addendum Note (Signed)
Addended by: [name] on: 04/25/2019 04:24 PM   Modules accepted: Orders

## 2019-04-25 NOTE — Patient Instructions (Signed)
Visit Information  Goals Addressed            This Visit's Progress     Patient Stated   . "I want this rash to go away" (pt-stated)       Current Barriers:  . Chronic Disease Management support and education needs related to eczema  Nurse Case Manager Clinical Goal(s):  [name] Over the next 30 days, patient will continue to apply Rx cream . Over the next 30 days, patient will continue to see improvement in eczema rash  Interventions:  . Discussed HPI . Chart reviewed . Encouraged patient to continue using Rx cream . Advised patient to reach out to RN or PCP if rash worsens or does not resolve  Patient Self Care Activities:  . Self administers medications as prescribed . Calls provider office for new concerns or questions . [place] and delivers medications . He has help from his sisters with IADLs  Please see past updates related to this goal by clicking on the "Past Updates" button in the selected goal         The care management team will reach out to the patient again over the next 30 days.   [name], BSN, RN-BC Embedded Chronic Care Manager [facility] [place] Management Direct Dial: [phone]   The patient verbalized understanding of instructions provided today and declined a print copy of patient instruction materials.

## 2019-04-28 ENCOUNTER — Telehealth: Payer: Self-pay

## 2019-04-28 NOTE — Telephone Encounter (Signed)
Copied from [place] [phone]. Topic: Referral - Status >> Apr 28, 2019  99991111 PM Simone Curia D wrote: [id number] Spoke with patient to let him know I had  contacted [employer] transportation and Medicaid In-home aide.  I will call him as soon as they contact me. Ambrose Mantle [phone]

## 2019-05-01 ENCOUNTER — Telehealth: Payer: Self-pay

## 2019-05-01 NOTE — Telephone Encounter (Signed)
Copied from [place] [phone]. Topic: Referral - Status >> May 01, 2019 123456 AM Simone Curia D wrote: [id number] Left a message on voicemail for patient to return my call regarding a referral being made to [facility] for Medicaid transportation and in-home aide.  Ambrose Mantle [phone]

## 2019-05-02 ENCOUNTER — Telehealth: Payer: Self-pay | Admitting: *Deleted

## 2019-05-02 NOTE — Telephone Encounter (Signed)
A message was left, re: his follow up visit. 

## 2019-05-12 ENCOUNTER — Other Ambulatory Visit: Payer: Self-pay

## 2019-05-12 ENCOUNTER — Ambulatory Visit: Payer: Self-pay | Admitting: Licensed Clinical Social Worker

## 2019-05-12 DIAGNOSIS — F329 Major depressive disorder, single episode, unspecified: Secondary | ICD-10-CM

## 2019-05-12 DIAGNOSIS — I10 Essential (primary) hypertension: Secondary | ICD-10-CM

## 2019-05-12 DIAGNOSIS — F411 Generalized anxiety disorder: Secondary | ICD-10-CM

## 2019-05-12 DIAGNOSIS — I25118 Atherosclerotic heart disease of native coronary artery with other forms of angina pectoris: Secondary | ICD-10-CM

## 2019-05-12 DIAGNOSIS — J441 Chronic obstructive pulmonary disease with (acute) exacerbation: Secondary | ICD-10-CM

## 2019-05-12 DIAGNOSIS — F32A Depression, unspecified: Secondary | ICD-10-CM

## 2019-05-12 DIAGNOSIS — M5136 Other intervertebral disc degeneration, lumbar region: Secondary | ICD-10-CM

## 2019-05-12 DIAGNOSIS — R482 Apraxia: Secondary | ICD-10-CM

## 2019-05-12 NOTE — Patient Outreach (Signed)
[facility]) Care Management  05/12/2019  Urbain Eiler Rufus [birth year] [id number]   Transportation arranged via [place] services to appointments at [facility] on 05/16/19 @ 1:55PM and 05/29/19 @ [place], [place] Worker [phone]

## 2019-05-12 NOTE — Chronic Care Management (AMB) (Signed)
  Care Management Note   [name] is a 64 y.o. year old male who is a primary care patient of [name], [name], MD. The CM team was consulted for assistance with chronic disease management and care coordination.   I reached out to [place] by phone today.   Review of patient status, including review of consultants reports, relevant laboratory and other test results, and collaboration with appropriate care team members and the patient's provider was performed as part of comprehensive patient evaluation and provision of chronic care management services.   Social determinants of health: risk of social isolation; risk of tobacco use; risk of depression    Chronic Care Management from 02/21/2019 in [facility]  PHQ-9 Total Score  9      GAD 7 : Generalized Anxiety Score 04/10/2019 05/01/2016 03/31/2016  Nervous, Anxious, on Edge 0 1 3  Control/stop worrying 0 1 3  Worry too much - different things 0 1 3  Trouble relaxing 0 0 2  Restless 0 0 2  Easily annoyed or irritable 0 0 0  Afraid - awful might happen 0 0 0  Total GAD 7 Score 0 3 13  Anxiety Difficulty Not difficult at all Not difficult at all Not difficult at all   Medications   Outpatient Medications acetaminophen (TYLENOL) 325 MG tablet albuterol (PROAIR HFA) 108 (90 Base) MCG/ACT inhaler aspirin EC 81 MG tablet atorvastatin (LIPITOR) 40 MG tablet buPROPion (WELLBUTRIN SR) 150 MG 12 hr tablet celecoxib (CELEBREX) 200 MG capsule cetaphil (CETAPHIL) lotion COMBIVENT RESPIMAT 20-100 MCG/ACT AERS respimat DULoxetine (CYMBALTA) 30 MG capsule fexofenadine (ALLEGRA) 180 MG tablet fluocinonide-emollient (LIDEX-E) 0.05 % cream fluticasone (FLONASE) 50 MCG/ACT nasal spray fluticasone furoate-vilanterol (BREO ELLIPTA) 100-25 MCG/INH AEPB isosorbide mononitrate (IMDUR) 30 MG 24 hr tablet metoprolol tartrate (LOPRESSOR) 25 MG tablet mirtazapine (REMERON) 15 MG tablet nitroGLYCERIN (NITROSTAT) 0.4 MG  SL tablet pantoprazole (PROTONIX) 40 MG tablet tamsulosin (FLOMAX) 0.4 MG CAPS capsule  Clinic-Administered Medications cyanocobalamin ((VITAMIN B-12)) injection 1,000 mcg  GOAL:      . Improve Insufficient Self Care Secondary to Grief (pt-stated)     Current Barriers:  [name] Grief reaction to sister's death in patient with Chronic Diagnoses of COPD, Apaxia of Speech, CAD, HTN, GAD, DDD, Depression . depression  Nurse Case Manager Clinical Goal(s):  [name] Over the next 30 days, patient will continue to talk with [name] regarding grief secondary to sister's death . Over the next 30 days, patient will work with RN to improve oral intake in presence of decreased appetite secondary to grief process  Interventions:   RNCM has encouraged patient to continue talking with [name] [name] talked with client about grief issues related to death of his older sister [name] provided counseling support for client [name] talked with client about social support network (sister) Talked with Almo about his upcoming medical appointments Talked with [place] about transport arrangements to his upcoming appointments   Patient Self Care Activities:  . Performs ADL's independently . Unable to perform IADLs independently. Has not have transportation arranged.   Initial goal documentation      Follow Up Plan: [name] to call client in next 4 weeks to talk with client about client's management of grief symptoms faced  Dain Josh.Cassandra Harbold MSW, [name] Licensed Clinical Social Worker [facility] [phone]

## 2019-05-12 NOTE — Patient Instructions (Addendum)
Licensed Clinical Social Worker Visit Information  Goals we discussed today:   .     [name] Improve Insufficient Self Care Secondary to Grief (pt-stated)     Current Barriers:  [name] Grief reaction to sister's death in patient with Chronic Diagnoses of COPD, Apaxia of Speech, CAD, HTN, GAD, DDD, Depression . depression  Nurse Case Manager Clinical Goal(s):  [name] Over the next 30 days, patient will continue to talk with [name] regarding grief secondary to sister's death . Over the next 30 days, patient will work with RN to improve oral intake in presence of decreased appetite secondary to grief process  Interventions:   RNCM has encouraged patient to continue talking with [name]  [name] talked with client about grief issues related to death of his older sister  [name] provided counseling support for client  [name] talked with client about social support network (sister)  Talked with Dragan about his upcoming medical appointments  Talked with Aime about transport arrangements to his upcoming appointments  Patient Self Care Activities:  . Performs ADL's independently . Unable to perform IADLs independently. Has not have transportation arranged.   Initial goal documentation        Materials Provided: No  Follow Up Plan:[name] to call client in next 4 weeks to talk with client about client's management of grief symptoms faced  The patient verbalized understanding of instructions provided today and declined a print copy of patient instruction materials.   [name].Jennfer Gassen MSW, [name] Licensed Clinical Social Worker [facility] [phone]

## 2019-05-15 ENCOUNTER — Other Ambulatory Visit: Payer: Self-pay

## 2019-05-15 ENCOUNTER — Ambulatory Visit: Payer: Self-pay | Admitting: Licensed Clinical Social Worker

## 2019-05-15 DIAGNOSIS — R482 Apraxia: Secondary | ICD-10-CM

## 2019-05-15 DIAGNOSIS — F32A Depression, unspecified: Secondary | ICD-10-CM

## 2019-05-15 DIAGNOSIS — I25118 Atherosclerotic heart disease of native coronary artery with other forms of angina pectoris: Secondary | ICD-10-CM

## 2019-05-15 DIAGNOSIS — M5136 Other intervertebral disc degeneration, lumbar region: Secondary | ICD-10-CM

## 2019-05-15 DIAGNOSIS — F329 Major depressive disorder, single episode, unspecified: Secondary | ICD-10-CM

## 2019-05-15 DIAGNOSIS — F411 Generalized anxiety disorder: Secondary | ICD-10-CM

## 2019-05-15 DIAGNOSIS — J441 Chronic obstructive pulmonary disease with (acute) exacerbation: Secondary | ICD-10-CM

## 2019-05-15 DIAGNOSIS — I10 Essential (primary) hypertension: Secondary | ICD-10-CM

## 2019-05-15 NOTE — Patient Instructions (Addendum)
Licensed Clinical Social Worker Visit Information  Materials Provided: No  [name] called client 2 times today to discuss transport arrangements for client appointment with Dr. Livia Snellen on 05/16/19. [name] called Amber Chrismon BSW two times today to work out arrangements for client transport to 1//26/21 appointment with Dr. Livia Snellen.  Follow Up Plan: [name] to call client as scheduled in next 4 weeks to assess client needs at that time  The patient verbalized understanding of instructions provided today and declined a print copy of patient instruction materials.   [name].Jimie Kuwahara MSW, [name] Licensed Clinical Social Worker [facility] [phone]

## 2019-05-15 NOTE — Chronic Care Management (AMB) (Signed)
  Care Management Note   [name] is a 64 y.o. year old male who is a primary care patient of [name], [name], MD. The CM team was consulted for assistance with chronic disease management and care coordination.   I reached out to [place] by phone today.   Review of patient status, including review of consultants reports, relevant laboratory and other test results, and collaboration with appropriate care team members and the patient's provider was performed as part of comprehensive patient evaluation and provision of chronic care management services.   Social determinants of health: risk of social isolation; risk of tobacco use; risk of depression    Chronic Care Management from 02/21/2019 in [facility]  PHQ-9 Total Score  9     GAD 7 : Generalized Anxiety Score 04/10/2019 05/01/2016 03/31/2016  Nervous, Anxious, on Edge 0 1 3  Control/stop worrying 0 1 3  Worry too much - different things 0 1 3  Trouble relaxing 0 0 2  Restless 0 0 2  Easily annoyed or irritable 0 0 0  Afraid - awful might happen 0 0 0  Total GAD 7 Score 0 3 13  Anxiety Difficulty Not difficult at all Not difficult at all Not difficult at all   Medications   Outpatient Medications acetaminophen (TYLENOL) 325 MG tablet albuterol (PROAIR HFA) 108 (90 Base) MCG/ACT inhaler aspirin EC 81 MG tablet atorvastatin (LIPITOR) 40 MG tablet buPROPion (WELLBUTRIN SR) 150 MG 12 hr tablet celecoxib (CELEBREX) 200 MG capsule cetaphil (CETAPHIL) lotion COMBIVENT RESPIMAT 20-100 MCG/ACT AERS respimat DULoxetine (CYMBALTA) 30 MG capsule fexofenadine (ALLEGRA) 180 MG tablet fluocinonide-emollient (LIDEX-E) 0.05 % cream fluticasone (FLONASE) 50 MCG/ACT nasal spray fluticasone furoate-vilanterol (BREO ELLIPTA) 100-25 MCG/INH AEPB isosorbide mononitrate (IMDUR) 30 MG 24 hr tablet metoprolol tartrate (LOPRESSOR) 25 MG tablet mirtazapine (REMERON) 15 MG tablet nitroGLYCERIN (NITROSTAT) 0.4 MG SL  tablet pantoprazole (PROTONIX) 40 MG tablet tamsulosin (FLOMAX) 0.4 MG CAPS capsule  Clinic-Administered Medications cyanocobalamin ((VITAMIN B-12)) injection 1,000 mcg  [name] called client 2 times today to discuss transport arrangements for client appointment with Dr. Livia Snellen on 05/16/19. [name] called Amber Chrismon BSW two times today to work out arrangements for client transport to 1//26/21 appointment with Dr. Livia Snellen.  Follow Up Plan: [name] to call client as scheduled in next 4 weeks to assess needs of client at that time  Lesley Spargo.Arna Luis MSW, [name] Licensed Clinical Social Worker [facility] [phone]

## 2019-05-16 ENCOUNTER — Encounter: Payer: Self-pay | Admitting: Family Medicine

## 2019-05-16 ENCOUNTER — Ambulatory Visit (INDEPENDENT_AMBULATORY_CARE_PROVIDER_SITE_OTHER): Payer: Medicare Other | Admitting: Family Medicine

## 2019-05-16 VITALS — BP 135/76 | HR 80 | Temp 98.2°F | Ht 73.0 in | Wt 246.0 lb

## 2019-05-16 DIAGNOSIS — I10 Essential (primary) hypertension: Secondary | ICD-10-CM

## 2019-05-16 DIAGNOSIS — M5136 Other intervertebral disc degeneration, lumbar region: Secondary | ICD-10-CM

## 2019-05-16 DIAGNOSIS — F3341 Major depressive disorder, recurrent, in partial remission: Secondary | ICD-10-CM

## 2019-05-16 DIAGNOSIS — N401 Enlarged prostate with lower urinary tract symptoms: Secondary | ICD-10-CM

## 2019-05-16 DIAGNOSIS — G809 Cerebral palsy, unspecified: Secondary | ICD-10-CM | POA: Diagnosis not present

## 2019-05-16 DIAGNOSIS — I25118 Atherosclerotic heart disease of native coronary artery with other forms of angina pectoris: Secondary | ICD-10-CM | POA: Diagnosis not present

## 2019-05-16 DIAGNOSIS — K219 Gastro-esophageal reflux disease without esophagitis: Secondary | ICD-10-CM

## 2019-05-16 DIAGNOSIS — F411 Generalized anxiety disorder: Secondary | ICD-10-CM | POA: Diagnosis not present

## 2019-05-16 DIAGNOSIS — J431 Panlobular emphysema: Secondary | ICD-10-CM

## 2019-05-16 DIAGNOSIS — E785 Hyperlipidemia, unspecified: Secondary | ICD-10-CM

## 2019-05-16 DIAGNOSIS — G40909 Epilepsy, unspecified, not intractable, without status epilepticus: Secondary | ICD-10-CM

## 2019-05-16 DIAGNOSIS — J301 Allergic rhinitis due to pollen: Secondary | ICD-10-CM

## 2019-05-16 DIAGNOSIS — R351 Nocturia: Secondary | ICD-10-CM

## 2019-05-16 DIAGNOSIS — G4701 Insomnia due to medical condition: Secondary | ICD-10-CM

## 2019-05-16 DIAGNOSIS — I209 Angina pectoris, unspecified: Secondary | ICD-10-CM

## 2019-05-16 MED ORDER — BREO ELLIPTA 100-25 MCG/INH IN AEPB: INHALATION_SPRAY | RESPIRATORY_TRACT | 3 refills | Status: DC

## 2019-05-16 MED ORDER — ASPIRIN EC 81 MG PO TBEC: 81.0000 mg | DELAYED_RELEASE_TABLET | Freq: Every day | ORAL | 11 refills | Status: AC

## 2019-05-16 MED ORDER — NITROGLYCERIN 0.4 MG SL SUBL: 0.4000 mg | SUBLINGUAL_TABLET | SUBLINGUAL | 11 refills | Status: DC | PRN

## 2019-05-16 MED ORDER — TAMSULOSIN HCL 0.4 MG PO CAPS: 0.8000 mg | ORAL_CAPSULE | Freq: Every day | ORAL | 3 refills | Status: DC

## 2019-05-16 MED ORDER — PANTOPRAZOLE SODIUM 40 MG PO TBEC: DELAYED_RELEASE_TABLET | ORAL | 3 refills | Status: DC

## 2019-05-16 MED ORDER — COMBIVENT RESPIMAT 20-100 MCG/ACT IN AERS: INHALATION_SPRAY | RESPIRATORY_TRACT | 11 refills | Status: DC

## 2019-05-16 MED ORDER — METOPROLOL TARTRATE 25 MG PO TABS: 25.0000 mg | ORAL_TABLET | Freq: Two times a day (BID) | ORAL | 3 refills | Status: DC

## 2019-05-16 MED ORDER — ISOSORBIDE MONONITRATE ER 30 MG PO TB24: 30.0000 mg | ORAL_TABLET | Freq: Every day | ORAL | 3 refills | Status: DC

## 2019-05-16 MED ORDER — DULOXETINE HCL 60 MG PO CPEP: 60.0000 mg | ORAL_CAPSULE | Freq: Every day | ORAL | 1 refills | Status: DC

## 2019-05-16 MED ORDER — BUPROPION HCL ER (SR) 150 MG PO TB12: 150.0000 mg | ORAL_TABLET | Freq: Every day | ORAL | 1 refills | Status: DC

## 2019-05-16 MED ORDER — ATORVASTATIN CALCIUM 40 MG PO TABS: ORAL_TABLET | ORAL | 1 refills | Status: DC

## 2019-05-16 MED ORDER — MIRTAZAPINE 15 MG PO TABS: 15.0000 mg | ORAL_TABLET | Freq: Every day | ORAL | 3 refills | Status: DC

## 2019-05-16 MED ORDER — CELECOXIB 200 MG PO CAPS: 200.0000 mg | ORAL_CAPSULE | Freq: Every day | ORAL | 1 refills | Status: DC | PRN

## 2019-05-16 NOTE — Progress Notes (Signed)
Subjective:  Patient ID: [name], male    DOB: [birth year]  Age: 64 y.o. MRN: [id number]  CC: Follow-up (1 month recheck)   HPI [name] presents for  follow-up of hypertension. Patient has no history of headache chest pain or shortness of breath or recent cough. Patient also denies symptoms of TIA such as focal numbness or weakness. Patient denies side effects from medication. States taking it regularly.  Patient in for follow-up of elevated cholesterol. Doing well without complaints on current medication. Denies side effects of statin including myalgia and arthralgia and nausea. Also in today for liver function testing. Currently no chest pain, shortness of breath or other cardiovascular related symptoms noted.  Patient continues his medications for his anxiety and depression.  He says he still gets upset and tearful at times over the recent death of his sister.  He continues to use his Cymbalta, bupropion and mirtazapine to help with his anxiety and depression symptoms  He has arthritis in the right hand that is bothering him some today.  Overall this is well covered by the Celebrex.  Denies side effects from medication.  With regard to his coronary artery disease he continues to take the metoprolol for angina and has not had to use the as needed nitroglycerin recently.  Follow-up prostate: Follow-up BPH patient continues to use tamsulosin daily.  He is getting a reasonably good night sleep.  Not getting up at night for urination  Patient in for follow-up of GERD. Currently asymptomatic taking  PPI daily. There is no chest pain or heartburn. No hematemesis and no melena. No dysphagia or choking. Onset is remote. Progression is stable. Complicating factors, none.    History [name] has a past medical history of Cerebral palsy ([place]), Coronary artery disease, Depression, GERD (gastroesophageal reflux disease), Hypertension, Scoliosis, and Seizures ([place]).   He has a past  surgical history that includes Coronary angioplasty with stent; Carpal tunnel release (Right, 08/13/2016); and LEFT HEART CATH AND CORONARY ANGIOGRAPHY (N/A, 07/10/2017).   His family history includes Alcohol abuse in his brother; CAD in his brother and sister; CAD (age of onset: 65) in his father; Diabetes in his mother; Heart disease in his father; Heart failure in his sister.He reports that he quit smoking about 11 months ago. His smoking use included cigarettes. He has a 20.50 pack-year smoking history. He has never used smokeless tobacco. He reports current alcohol use. He reports that he does not use drugs.  Current Outpatient Medications on File Prior to Visit  Medication Sig Dispense Refill  . acetaminophen (TYLENOL) 325 MG tablet Take 2 tablets (650 mg total) by mouth every 6 (six) hours as needed for mild pain or headache.    . albuterol (PROAIR HFA) 108 (90 Base) MCG/ACT inhaler 1-2 puffs every 6 hours as needed wheezing or shortness of breath. 17 g 10  . cetaphil (CETAPHIL) lotion Apply 1 application topically 2 (two) times daily. For rash. Apply after washing with [facility] shower gel and rinsing with clear warm water. 473 mL 0  . fexofenadine (ALLEGRA) 180 MG tablet Take 1 tablet (180 mg total) by mouth daily. For allergy symptoms 90 tablet 2  . fluocinonide-emollient (LIDEX-E) 0.05 % cream Apply 1 application topically 2 (two) times daily. To affected areas 60 g 5  . fluticasone (FLONASE) 50 MCG/ACT nasal spray Place 2 sprays into both nostrils daily as needed for allergies or rhinitis. 48 g 1   Current Facility-Administered Medications on File Prior to Visit  Medication Dose Route Frequency Provider Last Rate Last Admin  . cyanocobalamin ((VITAMIN B-12)) injection 1,000 mcg  1,000 mcg Intramuscular Q30 days [name], MD   1,000 mcg at 04/25/19 0908    ROS Review of Systems  Constitutional: Negative for fever.  Respiratory: Negative for shortness of breath.     Cardiovascular: Negative for chest pain.  Musculoskeletal: Negative for arthralgias.  Skin: Negative for rash.    Objective:  BP 135/76   Pulse 80   Temp 98.2 F (36.8 C) (Temporal)   Ht 6\' 1"  (1.854 m)   Wt 246 lb (111.6 kg)   BMI 32.46 kg/m   BP Readings from Last 3 Encounters:  05/16/19 135/76  04/10/19 126/73  03/24/19 135/75    Wt Readings from Last 3 Encounters:  05/16/19 246 lb (111.6 kg)  04/10/19 244 lb (110.7 kg)  03/24/19 243 lb 6.4 oz (110.4 kg)     Physical Exam Vitals reviewed.  Constitutional:      Appearance: He is well-developed.  HENT:     Head: Normocephalic and atraumatic.     Right Ear: Tympanic membrane and external ear normal. No decreased hearing noted.     Left Ear: Tympanic membrane and external ear normal. No decreased hearing noted.     Mouth/Throat:     Pharynx: No oropharyngeal exudate or posterior oropharyngeal erythema.  Eyes:     Pupils: Pupils are equal, round, and reactive to light.  Cardiovascular:     Rate and Rhythm: Normal rate and regular rhythm.     Heart sounds: No murmur.  Pulmonary:     Effort: No respiratory distress.     Breath sounds: Normal breath sounds.  Abdominal:     General: Bowel sounds are normal.     Palpations: Abdomen is soft. There is no mass.     Tenderness: There is no abdominal tenderness.  Musculoskeletal:     Cervical back: Normal range of motion and neck supple.       Assessment & Plan:   [name] was seen today for follow-up.  Diagnoses and all orders for this visit:  Dyslipidemia, goal LDL below 70 -     atorvastatin (LIPITOR) 40 MG tablet; TAKE 1 TABLET ONCE DAILY AT 6PM  Infantile cerebral palsy (HCC)  Essential hypertension -     metoprolol tartrate (LOPRESSOR) 25 MG tablet; Take 1 tablet (25 mg total) by mouth 2 (two) times daily.  GAD (generalized anxiety disorder) -     DULoxetine (CYMBALTA) 60 MG capsule; Take 1 capsule (60 mg total) by mouth daily. WITH A FULL STOMACH AT  SUPPER TIME  Coronary artery disease of native artery of native heart with stable angina pectoris (HCC) -     aspirin EC 81 MG tablet; Take 1 tablet (81 mg total) by mouth daily. -     atorvastatin (LIPITOR) 40 MG tablet; TAKE 1 TABLET ONCE DAILY AT 6PM -     isosorbide mononitrate (IMDUR) 30 MG 24 hr tablet; Take 1 tablet (30 mg total) by mouth daily. -     metoprolol tartrate (LOPRESSOR) 25 MG tablet; Take 1 tablet (25 mg total) by mouth 2 (two) times daily. -     nitroGLYCERIN (NITROSTAT) 0.4 MG SL tablet; Place 1 tablet (0.4 mg total) under the tongue every 5 (five) minutes x 3 doses as needed for chest pain.  Seizure disorder (HCC)  Recurrent major depressive disorder, in partial remission (HCC) -     buPROPion (WELLBUTRIN SR) 150 MG 12  hr tablet; Take 1 tablet (150 mg total) by mouth daily with breakfast. -     DULoxetine (CYMBALTA) 60 MG capsule; Take 1 capsule (60 mg total) by mouth daily. WITH A FULL STOMACH AT SUPPER TIME -     mirtazapine (REMERON) 15 MG tablet; Take 1 tablet (15 mg total) by mouth at bedtime. For sleep  Panlobular emphysema ([place]) -     Ipratropium-Albuterol (COMBIVENT RESPIMAT) 20-100 MCG/ACT AERS respimat; Inhale 1 puff into the lungs every 6 (six) hours -     fluticasone furoate-vilanterol (BREO ELLIPTA) 100-25 MCG/INH AEPB; Inhale 1 puff into the lungs daily.  Benign prostatic hyperplasia with nocturia -     tamsulosin (FLOMAX) 0.4 MG CAPS capsule; Take 2 capsules (0.8 mg total) by mouth daily after supper.  Degenerative disc disease, lumbar -     celecoxib (CELEBREX) 200 MG capsule; Take 1 capsule (200 mg total) by mouth daily as needed.  Gastroesophageal reflux disease, unspecified whether esophagitis present -     pantoprazole (PROTONIX) 40 MG tablet; TAKE  (1)  TABLET TWICE A DAY.  Seasonal allergic rhinitis due to pollen  Angina pectoris (HCC) -     isosorbide mononitrate (IMDUR) 30 MG 24 hr tablet; Take 1 tablet (30 mg total) by mouth daily. -      metoprolol tartrate (LOPRESSOR) 25 MG tablet; Take 1 tablet (25 mg total) by mouth 2 (two) times daily. -     nitroGLYCERIN (NITROSTAT) 0.4 MG SL tablet; Place 1 tablet (0.4 mg total) under the tongue every 5 (five) minutes x 3 doses as needed for chest pain.  Insomnia due to medical condition -     mirtazapine (REMERON) 15 MG tablet; Take 1 tablet (15 mg total) by mouth at bedtime. For sleep   Allergies as of 05/16/2019      Reactions   Chantix [varenicline Tartrate] Nausea And Vomiting      Medication List       Accurate as of May 16, 2019  2:50 PM. If you have any questions, ask your nurse or doctor.        acetaminophen 325 MG tablet Commonly known as: TYLENOL Take 2 tablets (650 mg total) by mouth every 6 (six) hours as needed for mild pain or headache.   albuterol 108 (90 Base) MCG/ACT inhaler Commonly known as: ProAir HFA 1-2 puffs every 6 hours as needed wheezing or shortness of breath.   aspirin EC 81 MG tablet Take 1 tablet (81 mg total) by mouth daily.   atorvastatin 40 MG tablet Commonly known as: LIPITOR TAKE 1 TABLET ONCE DAILY AT 6PM   Breo Ellipta 100-25 MCG/INH Aepb Generic drug: fluticasone furoate-vilanterol Inhale 1 puff into the lungs daily.   buPROPion 150 MG 12 hr tablet Commonly known as: WELLBUTRIN SR Take 1 tablet (150 mg total) by mouth daily with breakfast.   celecoxib 200 MG capsule Commonly known as: CELEBREX Take 1 capsule (200 mg total) by mouth daily as needed.   cetaphil lotion Apply 1 application topically 2 (two) times daily. For rash. Apply after washing with [facility] shower gel and rinsing with clear warm water.   Combivent Respimat 20-100 MCG/ACT Aers respimat Generic drug: Ipratropium-Albuterol Inhale 1 puff into the lungs every 6 (six) hours   DULoxetine 60 MG capsule Commonly known as: CYMBALTA Take 1 capsule (60 mg total) by mouth daily. WITH A FULL STOMACH AT SUPPER TIME What changed: medication  strength Changed by: [name], MD   fexofenadine 180 MG  tablet Commonly known as: ALLEGRA Take 1 tablet (180 mg total) by mouth daily. For allergy symptoms   fluocinonide-emollient 0.05 % cream Commonly known as: LIDEX-E Apply 1 application topically 2 (two) times daily. To affected areas   fluticasone 50 MCG/ACT nasal spray Commonly known as: FLONASE Place 2 sprays into both nostrils daily as needed for allergies or rhinitis.   isosorbide mononitrate 30 MG 24 hr tablet Commonly known as: IMDUR Take 1 tablet (30 mg total) by mouth daily.   metoprolol tartrate 25 MG tablet Commonly known as: LOPRESSOR Take 1 tablet (25 mg total) by mouth 2 (two) times daily.   mirtazapine 15 MG tablet Commonly known as: Remeron Take 1 tablet (15 mg total) by mouth at bedtime. For sleep   nitroGLYCERIN 0.4 MG SL tablet Commonly known as: NITROSTAT Place 1 tablet (0.4 mg total) under the tongue every 5 (five) minutes x 3 doses as needed for chest pain.   pantoprazole 40 MG tablet Commonly known as: PROTONIX TAKE  (1)  TABLET TWICE A DAY.   tamsulosin 0.4 MG Caps capsule Commonly known as: FLOMAX Take 2 capsules (0.8 mg total) by mouth daily after supper.       Meds ordered this encounter  Medications  . aspirin EC 81 MG tablet    Sig: Take 1 tablet (81 mg total) by mouth daily.    Dispense:  30 tablet    Refill:  11    Ordered as OTC by Dr Percival Spanish but patient needs this included in prepackaged meds from [facility]. Verbal order given to pharmacist last month.  [name] atorvastatin (LIPITOR) 40 MG tablet    Sig: TAKE 1 TABLET ONCE DAILY AT 6PM    Dispense:  90 tablet    Refill:  1  . buPROPion (WELLBUTRIN SR) 150 MG 12 hr tablet    Sig: Take 1 tablet (150 mg total) by mouth daily with breakfast.    Dispense:  90 tablet    Refill:  1  . celecoxib (CELEBREX) 200 MG capsule    Sig: Take 1 capsule (200 mg total) by mouth daily as needed.    Dispense:  90 capsule    Refill:  1   . Ipratropium-Albuterol (COMBIVENT RESPIMAT) 20-100 MCG/ACT AERS respimat    Sig: Inhale 1 puff into the lungs every 6 (six) hours    Dispense:  4 g    Refill:  11  . DULoxetine (CYMBALTA) 60 MG capsule    Sig: Take 1 capsule (60 mg total) by mouth daily. WITH A FULL STOMACH AT SUPPER TIME    Dispense:  90 capsule    Refill:  1  . fluticasone furoate-vilanterol (BREO ELLIPTA) 100-25 MCG/INH AEPB    Sig: Inhale 1 puff into the lungs daily.    Dispense:  90 each    Refill:  3  . isosorbide mononitrate (IMDUR) 30 MG 24 hr tablet    Sig: Take 1 tablet (30 mg total) by mouth daily.    Dispense:  90 tablet    Refill:  3  . metoprolol tartrate (LOPRESSOR) 25 MG tablet    Sig: Take 1 tablet (25 mg total) by mouth 2 (two) times daily.    Dispense:  180 tablet    Refill:  3  . mirtazapine (REMERON) 15 MG tablet    Sig: Take 1 tablet (15 mg total) by mouth at bedtime. For sleep    Dispense:  90 tablet    Refill:  3  . nitroGLYCERIN (NITROSTAT)  0.4 MG SL tablet    Sig: Place 1 tablet (0.4 mg total) under the tongue every 5 (five) minutes x 3 doses as needed for chest pain.    Dispense:  25 tablet    Refill:  11  . pantoprazole (PROTONIX) 40 MG tablet    Sig: TAKE  (1)  TABLET TWICE A DAY.    Dispense:  180 tablet    Refill:  3  . tamsulosin (FLOMAX) 0.4 MG CAPS capsule    Sig: Take 2 capsules (0.8 mg total) by mouth daily after supper.    Dispense:  180 capsule    Refill:  3    Patient appears to be quite stable at the current time managing well although of the arthritis that has been problematic in his back is flaring in the right wrist 2.  He manages quite well in spite of the weakness of the left upper extremity.  This is related to his cerebral palsy of course.  He has a history of COPD for which she is taking multiple inhalers noted above and is very stable with the prescribed medication.  He says occasionally he will notice himself wheezing when his allergies flareup.  He does have  rescue inhaler available in addition to his maintenance LABA.  Currently no medications are noted for seizure activity.  Patient has been seizure-free for some time.  That will continue to be monitored.  Since he has an extensive list of medication I do not want to belabor his pre-existing  polypharmacy  Follow-up: Return in about 3 months (around 08/14/2019).  [name], M.D.

## 2019-05-17 ENCOUNTER — Telehealth: Payer: Self-pay

## 2019-05-19 ENCOUNTER — Ambulatory Visit: Payer: Self-pay | Admitting: Licensed Clinical Social Worker

## 2019-05-19 DIAGNOSIS — F411 Generalized anxiety disorder: Secondary | ICD-10-CM

## 2019-05-19 DIAGNOSIS — F32A Depression, unspecified: Secondary | ICD-10-CM

## 2019-05-19 DIAGNOSIS — I25118 Atherosclerotic heart disease of native coronary artery with other forms of angina pectoris: Secondary | ICD-10-CM

## 2019-05-19 DIAGNOSIS — I10 Essential (primary) hypertension: Secondary | ICD-10-CM

## 2019-05-19 DIAGNOSIS — F329 Major depressive disorder, single episode, unspecified: Secondary | ICD-10-CM | POA: Diagnosis not present

## 2019-05-19 DIAGNOSIS — J441 Chronic obstructive pulmonary disease with (acute) exacerbation: Secondary | ICD-10-CM

## 2019-05-19 DIAGNOSIS — R482 Apraxia: Secondary | ICD-10-CM

## 2019-05-19 DIAGNOSIS — M5136 Other intervertebral disc degeneration, lumbar region: Secondary | ICD-10-CM

## 2019-05-19 NOTE — Patient Instructions (Addendum)
Licensed Clinical Social Worker Visit Information  Goals we discussed today:  Goals    . Improve Insufficient Self Care Secondary to Grief (pt-stated)     Current Barriers:  [name] Grief reaction to sister's death in patient with Chronic Diagnoses of COPD, Apaxia of Speech, CAD, HTN, GAD, DDD, Depression . depression  Nurse Case Manager Clinical Goal(s):  [name] Over the next 30 days, patient will continue to talk with [name] regarding grief secondary to sister's death . Over the next 30 days, patient will work with RN to improve oral intake in presence of decreased appetite secondary to grief process  Interventions:  RNCM has encouraged patient to continue talking with [name] [name] talked with client about grief issues related to death of his older sister [name] provided counseling support for client Talked with [place] about his upcoming medical appointments Talked with [place] about transport arrangements to his upcoming appointments  Patient Self Care Activities:  . Performs ADL's independently . Unable to perform IADLs independently. Has not have transportation arranged.   Initial goal documentation       Materials Provided: No  Follow Up Plan: [name] to call client in next 4 weeks to talk with client about his management of grief issues faced  The patient verbalized understanding of instructions provided today and declined a print copy of patient instruction materials.   [name].Triana Coover MSW, [name] Licensed Clinical Social Worker [facility] [phone]

## 2019-05-19 NOTE — Chronic Care Management (AMB) (Signed)
  Care Management Note   [name] is a 64 y.o. year old male who is a primary care patient of [name], [name], MD. The CM team was consulted for assistance with chronic disease management and care coordination.   I reached out to [place] by phone today.    Review of patient status, including review of consultants reports, relevant laboratory and other test results, and collaboration with appropriate care team members and the patient's provider was performed as part of comprehensive patient evaluation and provision of chronic care management services.   Social determinants of health: risk of social isolation; risk of tobacco use; risk of depression    Chronic Care Management from 02/21/2019 in [place]  PHQ-9 Total Score  9     GAD 7 : Generalized Anxiety Score 04/10/2019 05/01/2016 03/31/2016  Nervous, Anxious, on Edge 0 1 3  Control/stop worrying 0 1 3  Worry too much - different things 0 1 3  Trouble relaxing 0 0 2  Restless 0 0 2  Easily annoyed or irritable 0 0 0  Afraid - awful might happen 0 0 0  Total GAD 7 Score 0 3 13  Anxiety Difficulty Not difficult at all Not difficult at all Not difficult at all   Medications   Outpatient Medications acetaminophen (TYLENOL) 325 MG tablet albuterol (PROAIR HFA) 108 (90 Base) MCG/ACT inhaler aspirin EC 81 MG tablet atorvastatin (LIPITOR) 40 MG tablet buPROPion (WELLBUTRIN SR) 150 MG 12 hr tablet celecoxib (CELEBREX) 200 MG capsule cetaphil (CETAPHIL) lotion DULoxetine (CYMBALTA) 60 MG capsule fexofenadine (ALLEGRA) 180 MG tablet fluocinonide-emollient (LIDEX-E) 0.05 % cream fluticasone (FLONASE) 50 MCG/ACT nasal spray fluticasone furoate-vilanterol (BREO ELLIPTA) 100-25 MCG/INH AEPB Ipratropium-Albuterol (COMBIVENT RESPIMAT) 20-100 MCG/ACT AERS respimat isosorbide mononitrate (IMDUR) 30 MG 24 hr tablet metoprolol tartrate (LOPRESSOR) 25 MG tablet mirtazapine (REMERON) 15 MG  tablet nitroGLYCERIN (NITROSTAT) 0.4 MG SL tablet pantoprazole (PROTONIX) 40 MG tablet tamsulosin (FLOMAX) 0.4 MG CAPS capsule  Clinic-Administered Medications cyanocobalamin ((VITAMIN B-12)) injection 1,000 mcg  Goals        . Improve Insufficient Self Care Secondary to Grief (pt-stated)     Current Barriers:  [name] Grief reaction to sister's death in patient with Chronic Diagnoses of COPD, Apaxia of Speech, CAD, HTN, GAD, DDD, Depression . depression  Nurse Case Manager Clinical Goal(s):  [name] Over the next 30 days, patient will continue to talk with [name] regarding grief secondary to sister's death . Over the next 30 days, patient will work with RN to improve oral intake in presence of decreased appetite secondary to grief process  Interventions:  RNCM has encouraged patient to continue talking with [name] [name] talked with client about grief issues related to death of his older sister [name] provided counseling support for client Talked with Gram about his upcoming medical appointments Talked with Flay about transport arrangements to his upcoming appointments  Patient Self Care Activities:  . Performs ADL's independently . Unable to perform IADLs independently. Has not have transportation arranged.   Initial goal documentation     Follow Up Plan: [name] to call client in next 4 weeks to talk with client about his management of grief issues by client  [name].Johnmark Geiger MSW, [name] Licensed Clinical Social Worker [facility] [phone]

## 2019-05-23 NOTE — Progress Notes (Signed)
Virtual Visit via Telephone Note   This visit type was conducted due to national recommendations for restrictions regarding the COVID-19 Pandemic (e.g. social distancing) in an effort to limit this patient's exposure and mitigate transmission in our community.  Due to his co-morbid illnesses, this patient is at least at moderate risk for complications without adequate follow up.  This format is felt to be most appropriate for this patient at this time.  The patient did not have access to video technology/had technical difficulties with video requiring transitioning to audio format only (telephone).  All issues noted in this document were discussed and addressed.  No physical exam could be performed with this format.  Please refer to the patient's chart for his  consent to telehealth for [facility].   Date:  05/25/2019   ID:  [name], DOB 10/23/1955, MRN [id number]  Patient Location: Home Provider Location: Home  PCP:  [name], MD  Cardiologist:  Minus Breeding, MD  Electrophysiologist:  None   Evaluation Performed:  Follow-Up Visit  Chief Complaint:  CAD  History of Present Illness:    [name] is a 64 y.o. male with who presents for follow up of CAD.  The patient has a history of CAD. He has has had a remote LHC that showed 30% LAD and 70% diagonal lesion. He has reported having stents in the past, although this was felt to be possibly incorrect per prior cardiology evaluation.  He had chest pain and had cath in 2019.  The patient underwent LHC that showeda 70% LAD stenosis with other nonobstructive CAD and normal LV gram; no culprit lesion was identified.  He was managed medically.    Since we last saw him he has done OK.  He does get some atypical discomfort.  It is like a strange sensation like something moving in his chest or maybe like a fist sometimes in his chest.  This is sporadic.  He takes a nitroglycerin about once a week.  It sounds like this is a stable  pattern although it somewhat difficult for him to quantify or qualify.  I did review the cath results from 2019.  This happens rarely and he can do some walking and typically does in his yard without bringing on this discomfort routinely.  He is not describing resting chest discomfort.  Does not have associated symptoms.  The does not seem to be radiation of this discomfort from his chest.  He has no new shortness of breath, PND or orthopnea.  He has no new palpitations, presyncope or syncope.  The patient does not have symptoms concerning for COVID-19 infection (fever, chills, cough, or new shortness of breath).    Past Medical History:  Diagnosis Date  . Cerebral palsy ([place])   . Coronary artery disease    LAD 70% stenosis, cath 2019  . Depression   . GERD (gastroesophageal reflux disease)   . Hypertension   . Scoliosis   . Seizures ([place])    pt's sister states that patinet has had seizures in the past, pt camr for PAT by himself on last visit and she stst "they misunderstood what he was trying to say. Pt saw neurologist in March who did CT and states patient does not have seizures. On no meds, had seizures as child.   Past Surgical History:  Procedure Laterality Date  . CARPAL TUNNEL RELEASE Right 08/13/2016   Procedure: CARPAL TUNNEL RELEASE;  Surgeon: Carole Civil, MD;  Location: [facility];  Service: Orthopedics;  Laterality: Right;  . CORONARY ANGIOPLASTY WITH STENT PLACEMENT    . LEFT HEART CATH AND CORONARY ANGIOGRAPHY N/A 07/10/2017   Procedure: LEFT HEART CATH AND CORONARY ANGIOGRAPHY;  Surgeon: Troy Sine, MD;  Location: [facility];  Service: Cardiovascular;  Laterality: N/A;    Prior to Admission medications   Medication Sig Start Date End Date Taking? Authorizing Provider  acetaminophen (TYLENOL) 325 MG tablet Take 2 tablets (650 mg total) by mouth every 6 (six) hours as needed for mild pain or headache. 07/11/17  Yes Kilroy, Luke K, PA-C  albuterol (PROAIR HFA)  108 (90 Base) MCG/ACT inhaler 1-2 puffs every 6 hours as needed wheezing or shortness of breath. 04/10/19  Yes [name], MD  aspirin EC 81 MG tablet Take 1 tablet (81 mg total) by mouth daily. 05/16/19  Yes [name], [name], MD  atorvastatin (LIPITOR) 40 MG tablet TAKE 1 TABLET ONCE DAILY AT [facility] 05/16/19  Yes [name], MD  buPROPion [facility] SR) 150 MG 12 hr tablet Take 1 tablet (150 mg total) by mouth daily with breakfast. 05/16/19  Yes [name], MD  celecoxib (CELEBREX) 200 MG capsule Take 1 capsule (200 mg total) by mouth daily as needed. 05/16/19  Yes [name], [name], MD  cetaphil (CETAPHIL) lotion Apply 1 application topically 2 (two) times daily. For rash. Apply after washing with [facility] shower gel and rinsing with clear warm water. 04/05/19  Yes [name], MD  DULoxetine (CYMBALTA) 60 MG capsule Take 1 capsule (60 mg total) by mouth daily. WITH A FULL STOMACH AT SUPPER TIME 05/16/19  Yes [name], [name], MD  fexofenadine (ALLEGRA) 180 MG tablet Take 1 tablet (180 mg total) by mouth daily. For allergy symptoms 04/10/19  Yes [name], [name], MD  fluocinonide-emollient (LIDEX-E) 0.05 % cream Apply 1 application topically 2 (two) times daily. To affected areas 03/24/19  Yes [name], [name], MD  fluticasone [facility]) 50 MCG/ACT nasal spray Place 2 sprays into both nostrils daily as needed for allergies or rhinitis. 04/10/19  Yes [name], [name], MD  fluticasone furoate-vilanterol (BREO ELLIPTA) 100-25 MCG/INH AEPB Inhale 1 puff into the lungs daily. 05/16/19  Yes [name], [name], MD  Ipratropium-Albuterol (COMBIVENT RESPIMAT) 20-100 MCG/ACT AERS respimat Inhale 1 puff into the lungs every 6 (six) hours 05/16/19  Yes [name], [name], MD  isosorbide mononitrate (IMDUR) 30 MG 24 hr tablet Take 1 tablet (30 mg total) by mouth daily. 05/16/19  Yes [name], MD  metoprolol tartrate (LOPRESSOR) 25 MG tablet Take 1 tablet (25 mg total) by mouth 2 (two) times daily. 05/16/19  Yes [name], MD  mirtazapine (REMERON) 15 MG tablet Take 1 tablet (15 mg total) by mouth at bedtime. For sleep 05/16/19  Yes [name], [name], MD  nitroGLYCERIN (NITROSTAT) 0.4 MG SL tablet Place 1 tablet (0.4 mg total) under the tongue every 5 (five) minutes x 3 doses as needed for chest pain. 05/16/19  Yes [name], [name], MD  pantoprazole (PROTONIX) 40 MG tablet TAKE  (1)  TABLET TWICE A DAY. 05/16/19  Yes [name], MD  tamsulosin (FLOMAX) 0.4 MG CAPS capsule Take 2 capsules (0.8 mg total) by mouth daily after supper. 05/16/19  Yes [name], [name], MD   To see if it made any difference you know if you take it along with weight appetite and leg pain Allergies:   Chantix [varenicline tartrate]   Social History   Tobacco Use  . Smoking status: Former Smoker    Packs/day: 0.50  Years: 41.00    Pack years: 20.50    Types: Cigarettes    Quit date: 06/06/2018    Years since quitting: 0.9  . Smokeless tobacco: Never Used  . Tobacco comment: Patient quit on his own in February 2020  Substance Use Topics  . Alcohol use: Yes    Comment: "once in a blue moon" typically drinks beer 2 weeks or longer  . Drug use: No     Family Hx: The patient's family history includes Alcohol abuse in his brother; CAD in his brother and sister; CAD (age of onset: 45) in his father; Diabetes in his mother; Heart disease in his father; Heart failure in his sister.  ROS:   Please see the history of present illness.     All other systems reviewed and are negative.   Prior CV studies:   The following studies were reviewed today:  Cath report, labs ordered by [name], MD  Labs/Other Tests and Data Reviewed:    EKG:  NA  Recent Labs: 06/28/2018: Magnesium 2.0 04/10/2019: ALT 15; BUN 9; Creatinine, Ser 0.86; Hemoglobin 15.9; Platelets 226; Potassium 4.7; Sodium 144   Recent Lipid Panel Lab Results  Component Value Date/Time   CHOL 109 04/10/2019 02:29 PM   TRIG 212 (H) 04/10/2019 02:29 PM   HDL 31  (L) 04/10/2019 02:29 PM   CHOLHDL 3.5 04/10/2019 02:29 PM   CHOLHDL 3.2 07/11/2017 12:00 AM   LDLCALC 44 04/10/2019 02:29 PM    Wt Readings from Last 3 Encounters:  05/16/19 246 lb (111.6 kg)  04/10/19 244 lb (110.7 kg)  03/24/19 243 lb 6.4 oz (110.4 kg)     Objective:    Vital Signs:  There were no vitals taken for this visit.   NA  ASSESSMENT & PLAN:     CAD:   The patient does have some vague chest discomfort.  I am going to increase his Imdur to 60 mg daily.  He will continue otherwise with aggressive risk reduction.   HTN:   He could not take his blood pressure today but I did go back to late last month when he was seen by his primary and his blood pressure was well within target and has been on other readings.   DYSLIPIDEMIA:  I reviewed his labs.  His LDL last month drawn by his primary provider was 44.  No change in therapy.   TOBACCO ABUSE:   We talked again about the need to stop smoking.  He smokes 1 cigarette rarely.   COVID-19 Education: The signs and symptoms of COVID-19 were discussed with the patient and how to seek care for testing (follow up with PCP or arrange E-visit).  He wants to get the vaccine and we will call him and send him the information on how to try to get this in [facility] when it is his turn.    Time:   Today, I have spent 25 minutes with the patient with telehealth technology discussing the above problems.     Medication Adjustments/Labs and Tests Ordered: Current medicines are reviewed at length with the patient today.  Concerns regarding medicines are outlined above.   Tests Ordered: No orders of the defined types were placed in this encounter.   Medication Changes: Meds ordered this encounter  Medications  . isosorbide mononitrate (IMDUR) 60 MG 24 hr tablet    Sig: Take 1 tablet (60 mg total) by mouth daily.    Dispense:  90 tablet    Refill:  3    Follow Up:  In Person one year  Signed, Minus Breeding, MD   05/25/2019 9:58 AM    [place]

## 2019-05-25 ENCOUNTER — Ambulatory Visit: Payer: Self-pay | Admitting: Licensed Clinical Social Worker

## 2019-05-25 ENCOUNTER — Ambulatory Visit: Payer: Self-pay | Admitting: *Deleted

## 2019-05-25 ENCOUNTER — Other Ambulatory Visit: Payer: Self-pay

## 2019-05-25 ENCOUNTER — Encounter: Payer: Self-pay | Admitting: Cardiology

## 2019-05-25 ENCOUNTER — Telehealth (INDEPENDENT_AMBULATORY_CARE_PROVIDER_SITE_OTHER): Payer: Medicare Other | Admitting: Cardiology

## 2019-05-25 DIAGNOSIS — I10 Essential (primary) hypertension: Secondary | ICD-10-CM

## 2019-05-25 DIAGNOSIS — I25118 Atherosclerotic heart disease of native coronary artery with other forms of angina pectoris: Secondary | ICD-10-CM

## 2019-05-25 DIAGNOSIS — I2510 Atherosclerotic heart disease of native coronary artery without angina pectoris: Secondary | ICD-10-CM

## 2019-05-25 DIAGNOSIS — E785 Hyperlipidemia, unspecified: Secondary | ICD-10-CM

## 2019-05-25 DIAGNOSIS — M51369 Other intervertebral disc degeneration, lumbar region without mention of lumbar back pain or lower extremity pain: Secondary | ICD-10-CM

## 2019-05-25 DIAGNOSIS — M5136 Other intervertebral disc degeneration, lumbar region: Secondary | ICD-10-CM

## 2019-05-25 DIAGNOSIS — F32A Depression, unspecified: Secondary | ICD-10-CM

## 2019-05-25 DIAGNOSIS — F411 Generalized anxiety disorder: Secondary | ICD-10-CM

## 2019-05-25 DIAGNOSIS — I209 Angina pectoris, unspecified: Secondary | ICD-10-CM

## 2019-05-25 DIAGNOSIS — Z720 Tobacco use: Secondary | ICD-10-CM | POA: Diagnosis not present

## 2019-05-25 DIAGNOSIS — R482 Apraxia: Secondary | ICD-10-CM

## 2019-05-25 DIAGNOSIS — F329 Major depressive disorder, single episode, unspecified: Secondary | ICD-10-CM

## 2019-05-25 DIAGNOSIS — I252 Old myocardial infarction: Secondary | ICD-10-CM

## 2019-05-25 MED ORDER — ISOSORBIDE MONONITRATE ER 60 MG PO TB24: 60.0000 mg | ORAL_TABLET | Freq: Every day | ORAL | 3 refills | Status: DC

## 2019-05-25 NOTE — Patient Instructions (Signed)
Visit Information  Goals Addressed            This Visit's Progress   . Cardiology Follow-up       Current Barriers:  . Literacy barriers . Transportation barriers . Cognitive Deficits  Nurse Case Manager Clinical Goal(s):  [name] Over the next 14 days, patient will verbalize understanding of when cardiology follow-up is due  Interventions:  . Consulted by [name], [name] who spoke with patient by telephone today . Malyk had a follow-up with doctor Hochrein via televisit and his understanding was that he is to have an in-person follow-up in one month . Chart reviewed including recent office visit note with Dr Percival Spanish o It states in two places that he is to have an in-person f/u in 1 year . RN will reach out to [name] on 2/10 for scheduled telephone follow-up and discuss cardiology follow-up recommendations  Patient Self Care Activities:  . Performs ADL's independently . Has assistance from family with IADLs  Initial goal documentation      [name], BSN, RN-BC [place] [facility] Management Direct Dial: [phone]

## 2019-05-25 NOTE — Chronic Care Management (AMB) (Signed)
  Chronic Care Management   Care Coordination Note   05/25/2019 Name: [name] MRN: [id number] DOB: 1955-08-20  Referred by: [name], MD Reason for referral : Chronic Care Management (Care Coordination)   [name] is a 64 y.o. year old male who is a primary care patient of [name], [name], MD. The CCM team was consulted for assistance with chronic disease management and care coordination needs.    Review of patient status, including review of consultants reports, relevant laboratory and other test results, and collaboration with appropriate care team members and the patient's provider was performed as part of comprehensive patient evaluation and provision of chronic care management services.     RN Care Plan            This Visit's Progress   . Cardiology Follow-up       Current Barriers:  . Literacy barriers . Transportation barriers . Cognitive Deficits  Nurse Case Manager Clinical Goal(s):  [name] Over the next 14 days, patient will verbalize understanding of when cardiology follow-up is due  Interventions:  . Consulted by [name], [name] who spoke with patient by telephone today . [name] had a follow-up with doctor Hochrein via televisit and his understanding was that he is to have an in-person follow-up in one month . Chart reviewed including recent office visit note with Dr Percival Spanish o It states in two places that he is to have an in-person f/u in 1 year . RN will reach out to [name] on 2/10 for scheduled telephone follow-up and discuss cardiology follow-up recommendations  Patient Self Care Activities:  . Performs ADL's independently . Has assistance from family with IADLs  Initial goal documentation         Follow-up Plan:  Patient is scheduled for a CCM telephone call from RN on 05/31/2019   [name], BSN, RN-BC [place] [facility] [place] Management Direct Dial: [phone]

## 2019-05-25 NOTE — Patient Instructions (Addendum)
Licensed Clinical Social Worker Visit Information  Goals we discussed today:  Goals Addressed            This Visit's Progress   . "I need transportation to come in and get my B12 shot" (pt-stated)       Transportation needs in patient with infantile cerebral palsy, hypertension, and hx of MI  Current Barriers:  . Film/video editor.  . Literacy barriers . Transportation barriers in patient with Chronic Diagnoses of HTN, GAD, Hx of MI, CAD, Apraxia of Speech, DDD, Depression  Nurse Case Manager Clinical Goal(s):  [name] Over the next 14 days, patient will have transportation arranged for appointment with Dr Livia Snellen on 05/16/19 at 1:55  Interventions:  . [name] reviewed appointment information with patient  Informed client that [employer], BSW , had already made transport arrangement for client for his next B12 injection appointment  Talked with client about his upcoming medical appointments  Patient Self Care Activities:  . Performs most ADLs but does have in home Aide and has help form his sisters for most all IADLs  Please see past updates related to this goal by clicking on the "Past Updates" button in the selected goal        Materials Provided: No  Follow Up Plan: [name] to call client as scheduled to assess client needs at that time  The patient verbalized understanding of instructions provided today and declined a print copy of patient instruction materials.   [name].Dinara Lupu MSW, [name] Licensed Clinical Social Worker [facility] Care Management [phone]

## 2019-05-25 NOTE — Chronic Care Management (AMB) (Signed)
  Care Management Note   [name] is a 64 y.o. year old male who is a primary care patient of [name], [name], MD. The CM team was consulted for assistance with chronic disease management and care coordination.   I reached out to [place] by phone today.   Review of patient status, including review of consultants reports, relevant laboratory and other test results, and collaboration with appropriate care team members and the patient's provider was performed as part of comprehensive patient evaluation and provision of chronic care management services.   Social Determinants of health: risk of social isolation; risk of tobacco use; risk of depression    Chronic Care Management from 02/21/2019 in [place]  PHQ-9 Total Score  9     GAD 7 : Generalized Anxiety Score 04/10/2019 05/01/2016 03/31/2016  Nervous, Anxious, on Edge 0 1 3  Control/stop worrying 0 1 3  Worry too much - different things 0 1 3  Trouble relaxing 0 0 2  Restless 0 0 2  Easily annoyed or irritable 0 0 0  Afraid - awful might happen 0 0 0  Total GAD 7 Score 0 3 13  Anxiety Difficulty Not difficult at all Not difficult at all Not difficult at all    Medications   Outpatient Medications acetaminophen (TYLENOL) 325 MG tablet albuterol (PROAIR HFA) 108 (90 Base) MCG/ACT inhaler aspirin EC 81 MG tablet atorvastatin (LIPITOR) 40 MG tablet buPROPion (WELLBUTRIN SR) 150 MG 12 hr tablet celecoxib (CELEBREX) 200 MG capsule cetaphil (CETAPHIL) lotion DULoxetine (CYMBALTA) 60 MG capsule fexofenadine (ALLEGRA) 180 MG tablet fluocinonide-emollient (LIDEX-E) 0.05 % cream fluticasone (FLONASE) 50 MCG/ACT nasal spray fluticasone furoate-vilanterol (BREO ELLIPTA) 100-25 MCG/INH AEPB Ipratropium-Albuterol (COMBIVENT RESPIMAT) 20-100 MCG/ACT AERS respimat isosorbide mononitrate (IMDUR) 60 MG 24 hr tablet metoprolol tartrate (LOPRESSOR) 25 MG tablet mirtazapine (REMERON) 15 MG  tablet nitroGLYCERIN (NITROSTAT) 0.4 MG SL tablet pantoprazole (PROTONIX) 40 MG tablet tamsulosin (FLOMAX) 0.4 MG CAPS capsule  Clinic-Administered Medications cyanocobalamin ((VITAMIN B-12)) injection 1,000 mcg  Goals Addressed            This Visit's Progress   . "I need transportation to come in and get my B12 shot" (pt-stated)       Transportation needs in patient with infantile cerebral palsy, hypertension, and hx of MI  Current Barriers:  . Film/video editor.  . Literacy barriers . Transportation barriers in patient with Chronic Diagnoses of HTN, GAD, Hx of MI, CAD, Apraxia of Speech, DDD, Depression  Nurse Case Manager Clinical Goal(s):  [name] Over the next 14 days, patient will have transportation arranged for appointment with Dr Livia Snellen on 05/16/19 at 1:55  Interventions:  . [name] reviewed appointment information with patient . Informed client that [place] , had already made transport arrangement for client for his next B12 injection appointment . Talked with client about his upcoming medical appointments  Patient Self Care Activities:  . Performs most ADLs but does have in home Aide and has help form his sisters for most all IADLs   Please see past updates related to this goal by clicking on the "Past Updates" button in the selected goal        Follow Up Plan: [name] to call client as scheduled to assess client needs at that time  Jsiah Volkers.Ravinder Hofland MSW, [name] Licensed Clinical Social Worker [facility] Care Management [phone]

## 2019-05-25 NOTE — Patient Instructions (Addendum)
Medication Instructions:  Increase Imdur to 60mg  daily *If you need a refill on your cardiac medications before your next appointment, please call your pharmacy*  Lab Work: None.  Testing/Procedures: None  Follow-Up: At [facility], you and your health needs are our priority.  As part of our continuing mission to provide you with exceptional heart care, we have created designated Provider Care Teams.  These Care Teams include your primary Cardiologist (physician) and Advanced Practice Providers (APPs -  Physician Assistants and Nurse Practitioners) who all work together to provide you with the care you need, when you need it.  Your next appointment:   1 year  The format for your next appointment:   In person  Provider:   Dr. Percival Spanish  Other Instructions  CALL [phone] to schedule your COVID vaccine

## 2019-05-29 ENCOUNTER — Other Ambulatory Visit: Payer: Self-pay

## 2019-05-29 ENCOUNTER — Ambulatory Visit (INDEPENDENT_AMBULATORY_CARE_PROVIDER_SITE_OTHER): Payer: Medicare Other | Admitting: *Deleted

## 2019-05-29 DIAGNOSIS — E538 Deficiency of other specified B group vitamins: Secondary | ICD-10-CM | POA: Diagnosis not present

## 2019-05-30 ENCOUNTER — Ambulatory Visit (INDEPENDENT_AMBULATORY_CARE_PROVIDER_SITE_OTHER): Payer: Medicare Other | Admitting: *Deleted

## 2019-05-30 DIAGNOSIS — I252 Old myocardial infarction: Secondary | ICD-10-CM

## 2019-05-30 DIAGNOSIS — I10 Essential (primary) hypertension: Secondary | ICD-10-CM

## 2019-05-30 DIAGNOSIS — I25118 Atherosclerotic heart disease of native coronary artery with other forms of angina pectoris: Secondary | ICD-10-CM | POA: Diagnosis not present

## 2019-05-30 DIAGNOSIS — F329 Major depressive disorder, single episode, unspecified: Secondary | ICD-10-CM | POA: Diagnosis not present

## 2019-05-30 NOTE — Chronic Care Management (AMB) (Signed)
  Chronic Care Management   Care Coordination Note   05/30/2019 Name: [name] MRN: [id number] DOB: Jul 18, 1955  Referred by: [name], MD Reason for referral : Chronic Care Management (Care Coordination)   [name] is a 64 y.o. year old male who is a primary care patient of [name], [name], MD. The CCM team was consulted for assistance with chronic disease management and care coordination needs.    Review of patient status, including review of consultants reports, relevant laboratory and other test results, and collaboration with appropriate care team members and the patient's provider was performed as part of comprehensive patient evaluation and provision of chronic care management services.    RN Care Plan           This Visit's Progress   . Cardiology Follow-up       Current Barriers:  . Literacy barriers . Transportation barriers . Cognitive Deficits  Nurse Case Manager Clinical Goal(s):  [name] Over the next 12 months, patient will follow-up with cardiologist as planned and sooner if necessary  Interventions:  . Consulted by [name], [name] who spoke with patient by telephone yesterday o Patient received a letter from Dr Hochrein's office. He thought it said that he is scheduled for an appointment this week.  . Chart reviewed. No upcoming appointments or procedures. He is on the recall list for 12 months from now because he just saw Dr Percival Spanish last week.  [name] Spoke with Dr [employer] office and verified that he does not have any appointments this week. Letter was most likely a recall letter asking hi to schedule an appointment, and it got sent out even though he was scheduled for an appointment already. He can disregard that letter per DR Hochrein's office.  . Talked with patient and explained that it was an old recall letter and that he can disregard it.  . Advised that he is due for a follow-up in 1 year but to reach out sooner if necessary  Patient Self Care  Activities:  . Performs ADL's independently . Has assistance from family with IADLs  Please see past updates related to this goal by clicking on the "Past Updates" button in the selected goal          Follow-up Plan:   The care management team will reach out to the patient again over the next 30 days.    [name], BSN, RN-BC Embedded Chronic Care Manager [facility] / [place] Management Direct Dial: [phone]

## 2019-05-30 NOTE — Patient Instructions (Signed)
Visit Information  Goals Addressed            This Visit's Progress   . COMPLETED: Cardiology Follow-up       Current Barriers:  . Literacy barriers . Transportation barriers . Cognitive Deficits  Nurse Case Manager Clinical Goal(s):  [name] Over the next 12 months, patient will follow-up with cardiologist as planned and sooner if necessary  Interventions:  . Consulted by [name], [name] who spoke with patient by telephone yesterday o Patient received a letter from Dr Hochrein's office. He thought it said that he is scheduled for an appointment this week.  . Chart reviewed. No upcoming appointments or procedures. He is on the recall list for 12 months from now because he just saw Dr Percival Spanish last week.  [name] Spoke with Dr [employer] office and verified that he does not have any appointments this week. Letter was most likely a recall letter asking hi to schedule an appointment, and it got sent out even though he was scheduled for an appointment already. He can disregard that letter per DR Hochrein's office.  . Talked with patient and explained that it was an old recall letter and that he can disregard it.  . Advised that he is due for a follow-up in 1 year but to reach out sooner if necessary  Patient Self Care Activities:  . Performs ADL's independently . Has assistance from family with IADLs  Please see past updates related to this goal by clicking on the "Past Updates" button in the selected goal         The care management team will reach out to the patient again over the next 30 days.    [name], BSN, RN-BC Embedded Chronic Care Manager [facility] / [place] Management Direct Dial: [phone]   The patient verbalized understanding of instructions provided today and declined a print copy of patient instruction materials.

## 2019-05-31 ENCOUNTER — Telehealth: Payer: Medicare Other

## 2019-06-08 ENCOUNTER — Telehealth: Payer: Self-pay

## 2019-06-16 ENCOUNTER — Ambulatory Visit: Payer: Self-pay | Admitting: Licensed Clinical Social Worker

## 2019-06-16 DIAGNOSIS — F32A Depression, unspecified: Secondary | ICD-10-CM

## 2019-06-16 DIAGNOSIS — F329 Major depressive disorder, single episode, unspecified: Secondary | ICD-10-CM

## 2019-06-16 DIAGNOSIS — I25118 Atherosclerotic heart disease of native coronary artery with other forms of angina pectoris: Secondary | ICD-10-CM

## 2019-06-16 DIAGNOSIS — I10 Essential (primary) hypertension: Secondary | ICD-10-CM

## 2019-06-16 DIAGNOSIS — R482 Apraxia: Secondary | ICD-10-CM

## 2019-06-16 DIAGNOSIS — F411 Generalized anxiety disorder: Secondary | ICD-10-CM

## 2019-06-16 DIAGNOSIS — I252 Old myocardial infarction: Secondary | ICD-10-CM

## 2019-06-16 DIAGNOSIS — M5136 Other intervertebral disc degeneration, lumbar region: Secondary | ICD-10-CM

## 2019-06-16 NOTE — Patient Instructions (Addendum)
Licensed Clinical Social Worker Visit Information  Goals we discussed today:  Goals Addressed                         . "I need transportation to come in and get my B12 shot" (pt-stated)         Transportation needs in patient with infantile cerebral palsy, hypertension, and hx of MI  Current Barriers:  Film/video editor.  Literacy barriers  Transportation barriers in patient with Chronic Diagnoses of HTN, GAD, Hx of MI, CAD, Apraxia of Speech, DDD, Depression Nurse Case Manager Clinical Goal(s):  Over the next 14 days, patient will have transportation arranged for appointment with LPN at [facility] on QA348G at 9:00 AM  Interventions:  [name] reviewed appointment information with patient  [name] contacted representative at [employer] and scheduled [name] transpot for client for appointment at [facility] on 06/27/2019 at 28;00 AM  Talked with client about his upcoming medical appointments at [facility] on 06/27/2019 and informed client that [name] transport would provide him transport to and from appointment on 06/27/2019 at [facility] Patient Self Care Activities:  Performs most ADLs but does have in home Aide and has help form his sisters for most all IADLs Please see past updates related to this goal by clicking on the "Past Updates" button in the selected goal       Materials Provided: No  Follow Up Plan:[name] to call client as scheduled to assess client needs at that time  The patient verbalized understanding of instructions provided today and declined a print copy of patient instruction materials.   [name].Dorothee Napierkowski MSW, [name] Licensed Clinical Social Worker [facility] Care Management [phone]

## 2019-06-16 NOTE — Chronic Care Management (AMB) (Signed)
Care Management Note   [name] is a 64 y.o. year old male who is a primary care patient of [name], [name], MD. The CM team was consulted for assistance with chronic disease management and care coordination.   I reached out to [name] by phone today.   Review of patient status, including review of consultants reports, relevant laboratory and other test results, and collaboration with appropriate care team members and the patient's provider was performed as part of comprehensive patient evaluation and provision of chronic care management services.   Social determinants of health: risk of social isolation; risk of tobacco use    Chronic Care Management from 02/21/2019 in [facility]  PHQ-9 Total Score  9     GAD 7 : Generalized Anxiety Score 04/10/2019 05/01/2016 03/31/2016  Nervous, Anxious, on Edge 0 1 3  Control/stop worrying 0 1 3  Worry too much - different things 0 1 3  Trouble relaxing 0 0 2  Restless 0 0 2  Easily annoyed or irritable 0 0 0  Afraid - awful might happen 0 0 0  Total GAD 7 Score 0 3 13  Anxiety Difficulty Not difficult at all Not difficult at all Not difficult at all   Medications   (very important)  New medications from outside sources are available for reconciliation  Outpatient Medications acetaminophen (TYLENOL) 325 MG tablet albuterol (PROAIR HFA) 108 (90 Base) MCG/ACT inhaler aspirin EC 81 MG tablet atorvastatin (LIPITOR) 40 MG tablet buPROPion (WELLBUTRIN SR) 150 MG 12 hr tablet celecoxib (CELEBREX) 200 MG capsule cetaphil (CETAPHIL) lotion DULoxetine (CYMBALTA) 60 MG capsule fexofenadine (ALLEGRA) 180 MG tablet fluocinonide-emollient (LIDEX-E) 0.05 % cream fluticasone (FLONASE) 50 MCG/ACT nasal spray fluticasone furoate-vilanterol (BREO ELLIPTA) 100-25 MCG/INH AEPB Ipratropium-Albuterol (COMBIVENT RESPIMAT) 20-100 MCG/ACT AERS respimat isosorbide mononitrate (IMDUR) 60 MG 24 hr tablet metoprolol tartrate  (LOPRESSOR) 25 MG tablet mirtazapine (REMERON) 15 MG tablet nitroGLYCERIN (NITROSTAT) 0.4 MG SL tablet pantoprazole (PROTONIX) 40 MG tablet tamsulosin (FLOMAX) 0.4 MG CAPS capsule  Clinic-Administered Medications cyanocobalamin ((VITAMIN B-12)) injection 1,000 mcg             Goals Addressed                           . "I need transportation to come in and get my B12 shot" (pt-stated)        Transportation needs in patient with infantile cerebral palsy, hypertension, and hx of MI  Current Barriers:   Film/video editor.   Literacy barriers  Transportation barriers in patient with Chronic Diagnoses of HTN, GAD, Hx of MI, CAD, Apraxia of Speech, DDD, Depression  Nurse Case Manager Clinical Goal(s):   Over the next 14 days, patient will have transportation arranged for appointment with LPN at [facility]  on QA348G at 9:00 AM   Interventions:   [name] reviewed appointment information with patient  [name] contacted representative at [place] and scheduled [name] transpot for client for appointment at [facility] on 06/27/2019 at 36;00 AM   Talked with client about his upcoming medical appointments at [facility] on 06/27/2019 and informed client that [name] transport would provide him transport to and from appointment on 06/27/2019 at [facility]  Patient Self Care Activities:   Performs most ADLs but does have in home Aide and has help form his sisters for most all IADLs   Please see past updates related to this goal by clicking on the "Past Updates" button in the selected goal  Follow Up Plan: [name] to call client as scheduled to assess client needs at that time  [name] MSW, [name] Licensed Clinical Social Worker [facility] Care Management [phone]

## 2019-06-21 ENCOUNTER — Ambulatory Visit: Payer: Medicare Other | Admitting: *Deleted

## 2019-06-21 NOTE — Chronic Care Management (AMB) (Signed)
  Chronic Care Management   Outreach Note  06/21/2019 Name: [name] MRN: [id number] DOB: 1956-02-21  Referred by: [name], MD Reason for referral : Chronic Care Management   An unsuccessful telephone follow-up was attempted today. The patient was referred to the case management team for assistance with care management and care coordination.   Follow Up Plan: A HIPPA compliant phone message was left for the patient providing contact information and requesting a return call.  The care management team will reach out to the patient again over the next 30 days.   [name], BSN, RN-BC Embedded Chronic Care Manager [facility] / [place] Management Direct Dial: [phone]

## 2019-06-22 ENCOUNTER — Ambulatory Visit (INDEPENDENT_AMBULATORY_CARE_PROVIDER_SITE_OTHER): Payer: Medicare Other | Admitting: *Deleted

## 2019-06-22 DIAGNOSIS — G809 Cerebral palsy, unspecified: Secondary | ICD-10-CM

## 2019-06-22 DIAGNOSIS — Z748 Other problems related to care provider dependency: Secondary | ICD-10-CM

## 2019-06-22 DIAGNOSIS — I10 Essential (primary) hypertension: Secondary | ICD-10-CM

## 2019-06-22 DIAGNOSIS — E538 Deficiency of other specified B group vitamins: Secondary | ICD-10-CM

## 2019-06-22 NOTE — Chronic Care Management (AMB) (Signed)
Chronic Care Management   Follow Up Note   06/22/2019 Name: [name] MRN: [id number] DOB: [birth year]  Referred by: [name], MD Reason for referral : No chief complaint on file.   [name] is a 64 y.o. year old male who is a primary care patient of [name], [name], MD. The CCM team was consulted for assistance with chronic disease management and care coordination needs.    Review of patient status, including review of consultants reports, relevant laboratory and other test results, and collaboration with appropriate care team members and the patient's provider was performed as part of comprehensive patient evaluation and provision of chronic care management services.    SDOH (Social Determinants of Health) assessments performed: Yes See Care Plan activities for detailed interventions related to SDOH)  SDOH Interventions     Most Recent Value  SDOH Interventions  SDOH Interventions for the Following Domains  Transportation  Transportation Interventions  Other (Comment) [Request submitted to [name]]       Outpatient Encounter Medications as of 06/22/2019  Medication Sig  . acetaminophen (TYLENOL) 325 MG tablet Take 2 tablets (650 mg total) by mouth every 6 (six) hours as needed for mild pain or headache.  . albuterol (PROAIR HFA) 108 (90 Base) MCG/ACT inhaler 1-2 puffs every 6 hours as needed wheezing or shortness of breath.  [name] aspirin EC 81 MG tablet Take 1 tablet (81 mg total) by mouth daily.  [name] atorvastatin (LIPITOR) 40 MG tablet TAKE 1 TABLET ONCE DAILY AT 6PM  . buPROPion (WELLBUTRIN SR) 150 MG 12 hr tablet Take 1 tablet (150 mg total) by mouth daily with breakfast.  . celecoxib (CELEBREX) 200 MG capsule Take 1 capsule (200 mg total) by mouth daily as needed.  . cetaphil (CETAPHIL) lotion Apply 1 application topically 2 (two) times daily. For rash. Apply after washing with [facility] shower gel and rinsing with clear warm water.  . DULoxetine  (CYMBALTA) 60 MG capsule Take 1 capsule (60 mg total) by mouth daily. WITH A FULL STOMACH AT SUPPER TIME  . fexofenadine (ALLEGRA) 180 MG tablet Take 1 tablet (180 mg total) by mouth daily. For allergy symptoms  . fluocinonide-emollient (LIDEX-E) 0.05 % cream Apply 1 application topically 2 (two) times daily. To affected areas  . fluticasone (FLONASE) 50 MCG/ACT nasal spray Place 2 sprays into both nostrils daily as needed for allergies or rhinitis.  . fluticasone furoate-vilanterol (BREO ELLIPTA) 100-25 MCG/INH AEPB Inhale 1 puff into the lungs daily.  . Ipratropium-Albuterol (COMBIVENT RESPIMAT) 20-100 MCG/ACT AERS respimat Inhale 1 puff into the lungs every 6 (six) hours  . isosorbide mononitrate (IMDUR) 60 MG 24 hr tablet Take 1 tablet (60 mg total) by mouth daily.  . metoprolol tartrate (LOPRESSOR) 25 MG tablet Take 1 tablet (25 mg total) by mouth 2 (two) times daily.  . mirtazapine (REMERON) 15 MG tablet Take 1 tablet (15 mg total) by mouth at bedtime. For sleep  . nitroGLYCERIN (NITROSTAT) 0.4 MG SL tablet Place 1 tablet (0.4 mg total) under the tongue every 5 (five) minutes x 3 doses as needed for chest pain.  . pantoprazole (PROTONIX) 40 MG tablet TAKE  (1)  TABLET TWICE A DAY.  . tamsulosin (FLOMAX) 0.4 MG CAPS capsule Take 2 capsules (0.8 mg total) by mouth daily after supper.   Facility-Administered Encounter Medications as of 06/22/2019  Medication  . cyanocobalamin ((VITAMIN B-12)) injection 1,000 mcg      RN Care Plan   . "I need  transportation to come in and get my B12 shot" (pt-stated)       Transportation needs in patient with infantile cerebral palsy, hypertension, and hx of MI  Current Barriers:  . Film/video editor.  . Literacy barriers . Transportation barriers in patient with Chronic Diagnoses of HTN, GAD, Hx of MI, CAD, Apraxia of Speech, DDD, Depression  Nurse Case Manager Clinical Goal(s):  [name] Over the next 2 days, patient will work with [name] to  have transportation arranged for his appointment at [facility] on 06/27/19  Interventions:  . Reviewed appointment information with patient . Collaborated with Care Guide, Ambrose Mantle regarding process for requesting transportation  . Reached out to [employer] at [phone] and verified cost center for form . Completed and emailed form securely to [email or web address]. They will reach out to patient to setup transportation.   Patient Self Care Activities:  . Performs most ADLs but does have in home Aide and has help form his sisters for most all IADLs   Please see past updates related to this goal by clicking on the "Past Updates" button in the selected goal      . "I want to keep my medications straight" (pt-stated)       Current Barriers:  [name] Knowledge Deficits related to medication management . Lacks caregiver support.  . Film/video editor.  . Literacy barriers . Transportation barriers . Cognitive Deficits  Nurse Case Manager Clinical Goal(s):  [name] Over the next 90 days, patient will reach out to PCP, pharmacist, or RN CCM with any questions about his medications  Interventions:   Discussed current medications and most recent changes with patient  Verified that he is still receiving deliveries from [facility] and this his medications are prepackaged  Reviewed last office note and medication list  Reviewed medications with patient  Discussed recent medication changes  Patient Self Care Activities:  . Currently UNABLE TO independently drive. . Attends all scheduled provider appointments with arranged transportation.   Please see past updates related to this goal by clicking on the "Past Updates" button in the selected goal       . Headache (pt-stated)       [place] (see longtitudinal plan of care for additional care plan information)  New onset of persistent headache in patient with hypertension, cerebral palsy, and seizure  disorder.   Current Barriers:  [name] Knowledge Deficits related to cause and treatment of headache . Literacy barriers . Transportation  Nurse Case Manager Clinical Goal(s):  [name] Over the next 7 days, patient will work with PCP to address needs related to new onset of persistent headache.   Interventions:  . Chart reviewed including recent office notes and labs . Talked with patient by telephone . Discussed HPI o Onset of persistent daily headache after last visit with PCP in January. Reports that some medications were changed.  o Patient states that he gets up with a headache and it worsens as the day progresses. It is only relieved with BC Powder. o Describes headache as primarily being in the front of his head where his hat line is . Reviewed medications with patient . Encouraged him to drink plenty of water . Recommended Tylenol as needed for headache. Discouraged BC use but advised to take with food and plenty of water if he was going to take it  . Appointment scheduled with PCP for 06/27/19 while he is at [facility] for his B12 shot . Arranged transportation to appointment . Encouraged patient  to reach out to CCM as needed . Encouraged patient to reach out to PCP if symptoms worsen or if new symptoms develop  Patient Self Care Activities:  . Patient verbalizes understanding of plan to assess headache . Performs ADL's independently . Unable to independently drive or read  Initial goal documentation         Plan:   The care management team will reach out to the patient again over the next 30 days.    [name], BSN, RN-BC Embedded Chronic Care Manager [facility] / [place] [facility] Direct Dial: [phone]

## 2019-06-22 NOTE — Patient Instructions (Signed)
RN Care Plan   . "I need transportation to come in and get my B12 shot" (pt-stated)       Transportation needs in patient with infantile cerebral palsy, hypertension, and hx of MI  Current Barriers:  . Film/video editor.  . Literacy barriers . Transportation barriers in patient with Chronic Diagnoses of HTN, GAD, Hx of MI, CAD, Apraxia of Speech, DDD, Depression  Nurse Case Manager Clinical Goal(s):  [name] Over the next 2 days, patient will work with [name] to have transportation arranged for his appointment at [facility] on 06/27/19  Interventions:  . Reviewed appointment information with patient . Collaborated with Care Guide, Ambrose Mantle regarding process for requesting transportation  . Reached out to [employer] at [phone] and verified cost center for form . Completed and emailed form securely to [email or web address]. They will reach out to patient to setup transportation.   Patient Self Care Activities:  . Performs most ADLs but does have in home Aide and has help form his sisters for most all IADLs   Please see past updates related to this goal by clicking on the "Past Updates" button in the selected goal      . "I want to keep my medications straight" (pt-stated)       Current Barriers:  [name] Knowledge Deficits related to medication management . Lacks caregiver support.  . Film/video editor.  . Literacy barriers . Transportation barriers . Cognitive Deficits  Nurse Case Manager Clinical Goal(s):  [name] Over the next 90 days, patient will reach out to PCP, pharmacist, or RN CCM with any questions about his medications  Interventions:   Discussed current medications and most recent changes with patient  Verified that he is still receiving deliveries from [facility] and this his medications are prepackaged  Reviewed last office note and medication list  Reviewed medications with patient  Discussed recent medication  changes  Patient Self Care Activities:  . Currently UNABLE TO independently drive. . Attends all scheduled provider appointments with arranged transportation.   Please see past updates related to this goal by clicking on the "Past Updates" button in the selected goal       . Headache (pt-stated)       [place] (see longtitudinal plan of care for additional care plan information)  New onset of persistent headache in patient with hypertension, cerebral palsy, and seizure disorder.   Current Barriers:  [name] Knowledge Deficits related to cause and treatment of headache . Literacy barriers . Transportation  Nurse Case Manager Clinical Goal(s):  [name] Over the next 7 days, patient will work with PCP to address needs related to new onset of persistent headache.   Interventions:  . Chart reviewed including recent office notes and labs . Talked with patient by telephone . Discussed HPI o Onset of persistent daily headache after last visit with PCP in January. Reports that some medications were changed.  o Patient states that he gets up with a headache and it worsens as the day progresses. It is only relieved with BC Powder. o Describes headache as primarily being in the front of his head where his hat line is . Reviewed medications with patient . Encouraged him to drink plenty of water . Recommended Tylenol as needed for headache. Discouraged BC use but advised to take with food and plenty of water if he was going to take it  . Appointment scheduled with PCP for 06/27/19 while he is at [facility] for his B12 shot .  Arranged transportation to appointment . Encouraged patient to reach out to CCM as needed . Encouraged patient to reach out to PCP if symptoms worsen or if new symptoms develop  Patient Self Care Activities:  . Patient verbalizes understanding of plan to assess headache . Performs ADL's independently . Unable to independently drive or read  Initial goal documentation          Plan:   The care management team will reach out to the patient again over the next 30 days.    [name], BSN, RN-BC Embedded Chronic Care Manager [facility] / [place] [facility] Direct Dial: [phone]   The patient verbalized understanding of instructions provided today and declined a print copy of patient instruction materials.

## 2019-06-26 ENCOUNTER — Other Ambulatory Visit: Payer: Self-pay

## 2019-06-27 ENCOUNTER — Ambulatory Visit: Payer: Medicare Other | Admitting: *Deleted

## 2019-06-27 ENCOUNTER — Encounter: Payer: Self-pay | Admitting: Family Medicine

## 2019-06-27 ENCOUNTER — Ambulatory Visit (INDEPENDENT_AMBULATORY_CARE_PROVIDER_SITE_OTHER): Payer: Medicare Other | Admitting: Family Medicine

## 2019-06-27 ENCOUNTER — Other Ambulatory Visit: Payer: Self-pay

## 2019-06-27 VITALS — BP 127/76 | HR 79 | Temp 97.1°F | Ht 73.0 in | Wt 242.8 lb

## 2019-06-27 DIAGNOSIS — G4489 Other headache syndrome: Secondary | ICD-10-CM | POA: Diagnosis not present

## 2019-06-27 DIAGNOSIS — E538 Deficiency of other specified B group vitamins: Secondary | ICD-10-CM

## 2019-06-27 DIAGNOSIS — D519 Vitamin B12 deficiency anemia, unspecified: Secondary | ICD-10-CM | POA: Diagnosis not present

## 2019-06-27 MED ORDER — BUTALBITAL-APAP-CAFFEINE 50-325-40 MG PO TABS: 1.0000 | ORAL_TABLET | Freq: Four times a day (QID) | ORAL | 0 refills | Status: DC | PRN

## 2019-06-27 MED ORDER — PREDNISONE 10 MG (48) PO TBPK: ORAL_TABLET | ORAL | 0 refills | Status: DC

## 2019-06-27 MED ORDER — BUTALBITAL-APAP-CAFFEINE 50-300-40 MG PO CAPS: 1.0000 | ORAL_CAPSULE | ORAL | 1 refills | Status: DC | PRN

## 2019-06-27 NOTE — Progress Notes (Signed)
Pt given B12 injection IM right deltoid and tolerated well. 

## 2019-06-27 NOTE — Patient Instructions (Signed)

## 2019-06-27 NOTE — Addendum Note (Signed)
Addended by: [name] on: 06/27/2019 11:34 AM   Modules accepted: Orders

## 2019-06-27 NOTE — Progress Notes (Signed)
Subjective:  Patient ID: [name], male    DOB: 1956/02/02  Age: 64 y.o. MRN: [id number]  CC: Headache   HPI Bladyn Elkind Bienkowski presents for headache for 2 weeks rated the worst ever.  It is in the frontal area points midline but says that he had radiates all the way across both sides bilaterally.  He is tried Tylenol and BC powders without relief.  He notes that it makes his eyes watering at times.  The pain is described as like someone hit him in the head.  He denies any injury.  The pain does interfere with his routine activities of doing yard work.  He has decreased his appetite.  He denies nausea vomiting diarrhea.  No fever no cough and no shortness of breath.  No focal neurologic changes were reported.  Depression screen [facility] 2/9 06/27/2019 05/16/2019 04/10/2019  Decreased Interest 0 0 0  Down, Depressed, Hopeless 0 0 0  PHQ - 2 Score 0 0 0  Altered sleeping - - -  Tired, decreased energy - - -  Change in appetite - - -  Feeling bad or failure about yourself  - - -  Trouble concentrating - - -  Moving slowly or fidgety/restless - - -  Suicidal thoughts - - -  PHQ-9 Score - - -  Difficult doing work/chores - - -  Some recent data might be hidden    History Kashus has a past medical history of Cerebral palsy ([place]), Coronary artery disease, Depression, GERD (gastroesophageal reflux disease), Hypertension, Scoliosis, and Seizures ([place]).   He has a past surgical history that includes Coronary angioplasty with stent; Carpal tunnel release (Right, 08/13/2016); and LEFT HEART CATH AND CORONARY ANGIOGRAPHY (N/A, 07/10/2017).   His family history includes Alcohol abuse in his brother; CAD in his brother and sister; CAD (age of onset: 58) in his father; Diabetes in his mother; Heart disease in his father; Heart failure in his sister.He reports that he quit smoking about 12 months ago. His smoking use included cigarettes. He has a 20.50 pack-year smoking history. He has never used smokeless  tobacco. He reports current alcohol use. He reports that he does not use drugs.    ROS Review of Systems  Constitutional: Negative for fever.  Respiratory: Negative for shortness of breath.   Cardiovascular: Negative for chest pain.  Musculoskeletal: Negative for arthralgias.  Skin: Negative for rash.  Neurological: Positive for headaches. Negative for dizziness, tremors, seizures, syncope, weakness and light-headedness.  Psychiatric/Behavioral: Negative for confusion, decreased concentration and dysphoric mood.    Objective:  BP 127/76   Pulse 79   Temp (!) 97.1 F (36.2 C) (Temporal)   Ht 6\' 1"  (1.854 m)   Wt 242 lb 12.8 oz (110.1 kg)   BMI 32.03 kg/m   BP Readings from Last 3 Encounters:  06/27/19 127/76  05/16/19 135/76  04/10/19 126/73    Wt Readings from Last 3 Encounters:  06/27/19 242 lb 12.8 oz (110.1 kg)  05/16/19 246 lb (111.6 kg)  04/10/19 244 lb (110.7 kg)     Physical Exam Constitutional:      General: He is not in acute distress.    Appearance: He is well-developed. He is obese. He is not ill-appearing or diaphoretic.  HENT:     Head: Normocephalic.     Mouth/Throat:     Mouth: Mucous membranes are moist.     Pharynx: Oropharynx is clear.  Eyes:     General: No scleral icterus.  Extraocular Movements: Extraocular movements intact.     Right eye: Normal extraocular motion and no nystagmus.     Left eye: Normal extraocular motion and no nystagmus.     Pupils: Pupils are equal, round, and reactive to light. Pupils are equal.     Right eye: Pupil is reactive.     Left eye: Pupil is reactive.  Cardiovascular:     Rate and Rhythm: Normal rate and regular rhythm.     Heart sounds: No murmur.  Pulmonary:     Effort: Pulmonary effort is normal.     Breath sounds: Normal breath sounds.  Abdominal:     Palpations: Abdomen is soft.     Tenderness: There is no abdominal tenderness.  Musculoskeletal:     Cervical back: Normal range of motion.    Neurological:     Mental Status: He is alert.     GCS: GCS eye subscore is 4. GCS verbal subscore is 5. GCS motor subscore is 6.     Cranial Nerves: No cranial nerve deficit or facial asymmetry.  Psychiatric:        Mood and Affect: Mood normal.        Speech: Speech normal.        Behavior: Behavior normal.       Assessment & Plan:   [place] was seen today for headache.  Diagnoses and all orders for this visit:  Other headache syndrome  Other orders -     Butalbital-APAP-Caffeine (FIORICET) 50-300-40 MG CAPS; Take 1 capsule by mouth every 4 (four) hours as needed (headache). -     predniSONE (STERAPRED UNI-PAK 48 TAB) 10 MG (48) TBPK tablet; Use as directed   Due to patient's complicated neurologic past, a neurologic assessment was performed.  He appears to be at his baseline for level of function regarding mentation cranial nerves extremities etc.  He has no injury to indicate any kind of intracranial processes this time in addition to lack of focal changes on his exam.  As a result I am going to treat him symptomatically and observe for changes.  Chronic care management is involved and will check on him as well.    I am having Christian Mate. Brinlee start on Butalbital-APAP-Caffeine and predniSONE. I am also having him maintain his acetaminophen, fluocinonide-emollient, cetaphil, albuterol, fexofenadine, fluticasone, aspirin EC, atorvastatin, buPROPion, celecoxib, Combivent Respimat, DULoxetine, Breo Ellipta, metoprolol tartrate, mirtazapine, nitroGLYCERIN, pantoprazole, tamsulosin, and isosorbide mononitrate. We will continue to administer cyanocobalamin.  Allergies as of 06/27/2019      Reactions   Chantix [varenicline Tartrate] Nausea And Vomiting      Medication List       Accurate as of June 27, 2019  9:50 AM. If you have any questions, ask your nurse or doctor.        acetaminophen 325 MG tablet Commonly known as: TYLENOL Take 2 tablets (650 mg total) by mouth every 6  (six) hours as needed for mild pain or headache.   albuterol 108 (90 Base) MCG/ACT inhaler Commonly known as: ProAir HFA 1-2 puffs every 6 hours as needed wheezing or shortness of breath.   aspirin EC 81 MG tablet Take 1 tablet (81 mg total) by mouth daily.   atorvastatin 40 MG tablet Commonly known as: LIPITOR TAKE 1 TABLET ONCE DAILY AT 6PM   Breo Ellipta 100-25 MCG/INH Aepb Generic drug: fluticasone furoate-vilanterol Inhale 1 puff into the lungs daily.   buPROPion 150 MG 12 hr tablet Commonly known as: WELLBUTRIN SR Take  1 tablet (150 mg total) by mouth daily with breakfast.   Butalbital-APAP-Caffeine 50-300-40 MG Caps Commonly known as: Fioricet Take 1 capsule by mouth every 4 (four) hours as needed (headache). Started by: [name], MD   celecoxib 200 MG capsule Commonly known as: CELEBREX Take 1 capsule (200 mg total) by mouth daily as needed.   cetaphil lotion Apply 1 application topically 2 (two) times daily. For rash. Apply after washing with [facility] shower gel and rinsing with clear warm water.   Combivent Respimat 20-100 MCG/ACT Aers respimat Generic drug: Ipratropium-Albuterol Inhale 1 puff into the lungs every 6 (six) hours   DULoxetine 60 MG capsule Commonly known as: CYMBALTA Take 1 capsule (60 mg total) by mouth daily. WITH A FULL STOMACH AT SUPPER TIME   fexofenadine 180 MG tablet Commonly known as: ALLEGRA Take 1 tablet (180 mg total) by mouth daily. For allergy symptoms   fluocinonide-emollient 0.05 % cream Commonly known as: LIDEX-E Apply 1 application topically 2 (two) times daily. To affected areas   fluticasone 50 MCG/ACT nasal spray Commonly known as: FLONASE Place 2 sprays into both nostrils daily as needed for allergies or rhinitis.   isosorbide mononitrate 60 MG 24 hr tablet Commonly known as: IMDUR Take 1 tablet (60 mg total) by mouth daily.   metoprolol tartrate 25 MG tablet Commonly known as: LOPRESSOR Take 1 tablet  (25 mg total) by mouth 2 (two) times daily.   mirtazapine 15 MG tablet Commonly known as: Remeron Take 1 tablet (15 mg total) by mouth at bedtime. For sleep   nitroGLYCERIN 0.4 MG SL tablet Commonly known as: NITROSTAT Place 1 tablet (0.4 mg total) under the tongue every 5 (five) minutes x 3 doses as needed for chest pain.   pantoprazole 40 MG tablet Commonly known as: PROTONIX TAKE  (1)  TABLET TWICE A DAY.   predniSONE 10 MG (48) Tbpk tablet Commonly known as: STERAPRED UNI-PAK 48 TAB Use as directed Started by: [name], MD   tamsulosin 0.4 MG Caps capsule Commonly known as: FLOMAX Take 2 capsules (0.8 mg total) by mouth daily after supper.        Follow-up: No follow-ups on file.  [name], M.D.

## 2019-06-28 ENCOUNTER — Ambulatory Visit: Payer: Medicare Other | Admitting: *Deleted

## 2019-06-28 DIAGNOSIS — I10 Essential (primary) hypertension: Secondary | ICD-10-CM

## 2019-06-28 DIAGNOSIS — J441 Chronic obstructive pulmonary disease with (acute) exacerbation: Secondary | ICD-10-CM

## 2019-06-28 DIAGNOSIS — F329 Major depressive disorder, single episode, unspecified: Secondary | ICD-10-CM | POA: Diagnosis not present

## 2019-06-28 DIAGNOSIS — Z550 Illiteracy and low-level literacy: Secondary | ICD-10-CM

## 2019-06-28 DIAGNOSIS — I25118 Atherosclerotic heart disease of native coronary artery with other forms of angina pectoris: Secondary | ICD-10-CM

## 2019-06-28 DIAGNOSIS — G40909 Epilepsy, unspecified, not intractable, without status epilepticus: Secondary | ICD-10-CM

## 2019-06-28 NOTE — Chronic Care Management (AMB) (Signed)
Chronic Care Management   Follow Up Note   06/28/2019 Name: [name] MRN: [id number] DOB: [birth year]  Referred by: [name], MD Reason for referral : Chronic Care Management (RN Follow up)   [name] is a 64 y.o. year old male who is a primary care patient of [name], [name], MD. The CCM team was consulted for assistance with chronic disease management and care coordination needs.    Review of patient status, including review of consultants reports, relevant laboratory and other test results, and collaboration with appropriate care team members and the patient's provider was performed as part of comprehensive patient evaluation and provision of chronic care management services.    I spoke with [name] by telephone today as a follow-up to the appointment he had yesterday with Dr Livia Snellen. "I want to make sure I'm taking this medicine right."   Outpatient Encounter Medications as of 06/28/2019  Medication Sig  . acetaminophen (TYLENOL) 325 MG tablet Take 2 tablets (650 mg total) by mouth every 6 (six) hours as needed for mild pain or headache.  . albuterol (PROAIR HFA) 108 (90 Base) MCG/ACT inhaler 1-2 puffs every 6 hours as needed wheezing or shortness of breath.  [name] aspirin EC 81 MG tablet Take 1 tablet (81 mg total) by mouth daily.  [name] atorvastatin (LIPITOR) 40 MG tablet TAKE 1 TABLET ONCE DAILY AT 6PM  . buPROPion (WELLBUTRIN SR) 150 MG 12 hr tablet Take 1 tablet (150 mg total) by mouth daily with breakfast.  . butalbital-acetaminophen-caffeine (ESGIC) 50-325-40 MG tablet Take 1 tablet by mouth every 6 (six) hours as needed for headache.  . celecoxib (CELEBREX) 200 MG capsule Take 1 capsule (200 mg total) by mouth daily as needed.  . cetaphil (CETAPHIL) lotion Apply 1 application topically 2 (two) times daily. For rash. Apply after washing with [facility] shower gel and rinsing with clear warm water.  . DULoxetine (CYMBALTA) 60 MG capsule Take 1 capsule (60 mg total) by  mouth daily. WITH A FULL STOMACH AT SUPPER TIME  . fexofenadine (ALLEGRA) 180 MG tablet Take 1 tablet (180 mg total) by mouth daily. For allergy symptoms  . fluocinonide-emollient (LIDEX-E) 0.05 % cream Apply 1 application topically 2 (two) times daily. To affected areas  . fluticasone (FLONASE) 50 MCG/ACT nasal spray Place 2 sprays into both nostrils daily as needed for allergies or rhinitis.  . fluticasone furoate-vilanterol (BREO ELLIPTA) 100-25 MCG/INH AEPB Inhale 1 puff into the lungs daily.  . Ipratropium-Albuterol (COMBIVENT RESPIMAT) 20-100 MCG/ACT AERS respimat Inhale 1 puff into the lungs every 6 (six) hours  . isosorbide mononitrate (IMDUR) 60 MG 24 hr tablet Take 1 tablet (60 mg total) by mouth daily.  . metoprolol tartrate (LOPRESSOR) 25 MG tablet Take 1 tablet (25 mg total) by mouth 2 (two) times daily.  . mirtazapine (REMERON) 15 MG tablet Take 1 tablet (15 mg total) by mouth at bedtime. For sleep  . nitroGLYCERIN (NITROSTAT) 0.4 MG SL tablet Place 1 tablet (0.4 mg total) under the tongue every 5 (five) minutes x 3 doses as needed for chest pain.  . pantoprazole (PROTONIX) 40 MG tablet TAKE  (1)  TABLET TWICE A DAY.  [name] predniSONE (STERAPRED UNI-PAK 48 TAB) 10 MG (48) TBPK tablet Use as directed  . tamsulosin (FLOMAX) 0.4 MG CAPS capsule Take 2 capsules (0.8 mg total) by mouth daily after supper.   Facility-Administered Encounter Medications as of 06/28/2019  Medication  . cyanocobalamin ((VITAMIN B-12)) injection 1,000 mcg  RN Care Plan   . Headache        CARE PLAN ENTRY (see longtitudinal plan of care for additional care plan information)  New onset of persistent headache in patient with hypertension, cerebral palsy, and seizure disorder.   Current Barriers:  [name] Knowledge Deficits related to cause and treatment of headache . Literacy barriers . Transportation  Nurse Case Manager Clinical Goal(s):  [name] Over the next 30 days, patient will see resolution of  headaches  Interventions:  . Chart reviewed including recent office notes and labs . Talked with patient by telephone . Reviewed newly prescribed medications: Esgic and prednisone.  o Consulted with [employer] regarding directions for Prednisone. They put them in a pill pack for him so it would be a familiar delivery system. Directions: 3 pills twice a day with food for the first 4 days, 2 pills twice a day with food for the next 4 days, 1 pill twice a day with food for the last 4 days o Advised to take Esgic every 6 hours PRN. Take with food and plenty of water.  . Verified with [employer] that they did review the directions with the patient . Thoroughly reviewed the directions for taking Prednisone with the patient again and verified understanding by having him repeat them. Thoroughly reviewed directions for taking Esgic.  [name] Discussed current state. Patient reports that he took [place] yesterday and his headache was relieved. He did not wake up with a headache today.  . Encouraged patient to reach out to CCM as needed . Encouraged patient to reach out to PCP if symptoms worsen or if new symptoms develop  Patient Self Care Activities:  . Patient verbalizes understanding of plan to assess headache . Performs ADL's independently . Unable to independently drive or read  Please see past updates related to this goal by clicking on the "Past Updates" button in the selected goal          Plan:  The care management team will reach out to the patient again over the next 14 days.   Patient will f/u with PCP as needed Patient will reach out to CCM team as needed  [name], BSN, RN-BC [place] [facility] [place] [facility] Direct Dial: [phone]

## 2019-06-28 NOTE — Patient Instructions (Signed)
Visit Information  Goals Addressed            This Visit's Progress     Patient Stated   . Headache (pt-stated)       CARE PLAN ENTRY (see longtitudinal plan of care for additional care plan information)  New onset of persistent headache in patient with hypertension, cerebral palsy, and seizure disorder.   Current Barriers:  [name] Knowledge Deficits related to cause and treatment of headache . Literacy barriers . Transportation  Nurse Case Manager Clinical Goal(s):  [name] Over the next 30 days, patient will see resolution of headaches  Interventions:  . Chart reviewed including recent office notes and labs . Talked with patient by telephone . Reviewed newly prescribed medications: Esgic and prednisone.  o Consulted with [employer] regarding directions for Prednisone. They put them in a pill pack for him so it would be a familiar delivery system. Directions: 3 pills twice a day with food for the first 4 days, 2 pills twice a day with food for the next 4 days, 1 pill twice a day with food for the last 4 days o Advised to take Esgic every 6 hours PRN. Take with food and plenty of water.  . Verified with [facility] that they did review the directions with the patient . Thoroughly reviewed the directions for taking Prednisone with the patient again and verified understanding by having him repeat them. Thoroughly reviewed directions for taking Esgic.  [name] Discussed current state. Patient reports that he took [place] yesterday and his headache was relieved. He did not wake up with a headache today.  . Encouraged patient to reach out to CCM as needed . Encouraged patient to reach out to PCP if symptoms worsen or if new symptoms develop  Patient Self Care Activities:  . Patient verbalizes understanding of plan to assess headache . Performs ADL's independently . Unable to independently drive or read  Please see past updates related to this goal by clicking on the "Past Updates" button  in the selected goal         The patient verbalized understanding of instructions provided today and declined a print copy of patient instruction materials.   Follow-up Plan The care management team will reach out to the patient again over the next 14 days.   Patient will f/u with PCP as needed Patient will f/u with CCM team as needed  [name], BSN, RN-BC [place] [facility] [place] Management Direct Dial: [phone]

## 2019-06-29 NOTE — Chronic Care Management (AMB) (Signed)
Chronic Care Management   Initial Visit Note  11/10/2018 Name: [name] MRN: [id number] DOB: 1955-08-07  Referred by: [name], MD Reason for referral : Chronic Care Management (RNCM Initial Outreach)   [name] is a 64 y.o. year old male who is a primary care patient of [name], [name], MD. The CCM team was consulted for assistance with chronic disease management and care coordination needs related to  Infantile Cerebral Palsy with cognitive deficits, GAD, illiteracy, apraxia of speech, angina, hx of MI, CAD, dyslipidemia,B12 deficiency, eczema,DDD,Depression,HTN,COPD.   Review of patient status, including review of consultants reports, relevant laboratory and other test results, and collaboration with appropriate care team members and the patient's provider was performed as part of comprehensive patient evaluation and provision of chronic care management services.    I spoke with [name] by telephone regarding his chronic care management needs.     Medications: Outpatient Encounter Medications as of 11/10/2018  Medication Sig  . acetaminophen (TYLENOL) 325 MG tablet Take 2 tablets (650 mg total) by mouth every 6 (six) hours as needed for mild pain or headache.  . [DISCONTINUED] albuterol (PROAIR HFA) 108 (90 Base) MCG/ACT inhaler 1-2 puffs every 6 hours as needed wheezing or shortness of breath.  . [DISCONTINUED] aspirin EC 81 MG tablet Take 1 tablet (81 mg total) by mouth daily.  . [DISCONTINUED] atorvastatin (LIPITOR) 40 MG tablet Take 1 capsule by mouth at bedtime.  . [DISCONTINUED] buPROPion (WELLBUTRIN SR) 150 MG 12 hr tablet Take 1 tablet (150 mg total) by mouth 2 (two) times daily.  . [DISCONTINUED] celecoxib (CELEBREX) 200 MG capsule Take 200 mg by mouth daily as needed for pain.  . [DISCONTINUED] COMBIVENT RESPIMAT 20-100 MCG/ACT AERS respimat Inhale 1 puff into the lungs every 6 (six) hours  . [DISCONTINUED] DULoxetine (CYMBALTA) 30 MG capsule Take 2 capsules  (60 mg total) by mouth daily. WITH A FULL STOMACH AT SUPPER TIME  . [DISCONTINUED] fexofenadine (ALLEGRA) 180 MG tablet Take 1 tablet (180 mg total) by mouth daily. For allergy symptoms  . [DISCONTINUED] fluticasone (FLONASE) 50 MCG/ACT nasal spray Place 2 sprays into both nostrils daily as needed for allergies or rhinitis.  . [DISCONTINUED] fluticasone furoate-vilanterol (BREO ELLIPTA) 100-25 MCG/INH AEPB Inhale 1 puff into the lungs daily.  . [DISCONTINUED] isosorbide mononitrate (IMDUR) 30 MG 24 hr tablet Take 1 tablet (30 mg total) by mouth daily.  . [DISCONTINUED] metoprolol tartrate (LOPRESSOR) 25 MG tablet Take 1/2 tablet by mouth two times a day and include in pill packs  . [DISCONTINUED] nitroGLYCERIN (NITROSTAT) 0.4 MG SL tablet Place 1 tablet (0.4 mg total) under the tongue every 5 (five) minutes x 3 doses as needed for chest pain.  . [DISCONTINUED] pantoprazole (PROTONIX) 40 MG tablet Take 1 tablet (40 mg total) by mouth 2 (two) times daily.  . [DISCONTINUED] tamsulosin (FLOMAX) 0.4 MG CAPS capsule TAKE (1) CAPSULE DAILY   Facility-Administered Encounter Medications as of 11/10/2018  Medication  . [EXPIRED] cyanocobalamin ((VITAMIN B-12)) injection 1,000 mcg  . cyanocobalamin ((VITAMIN B-12)) injection 1,000 mcg      RN Care Plan           This Visit's Progress   . Chronic Disease Management Needs        Current Barriers:  . Chronic Disease Management support, education, and care coordination needs related to  Infantile Cerebral Palsy with cognitive deficits, GAD, illiteracy, apraxia of speech, angina, hx of MI, CAD, dyslipidemia,B12 deficiency, eczema,DDD,Depression,HTN,COPD, eczema . Literacy . transportation  Clinical Goal(s) related to  Infantile Cerebral Palsy with cognitive deficits, GAD, illiteracy, apraxia of speech, angina, hx of MI, CAD, dyslipidemia,B12 deficiency, eczema,DDD,Depression,HTN,COPD, eczema:  Over the next 60 days, patient will:  . Work with the care  management team to address educational, disease management, and care coordination needs  . Begin or continue self health monitoring activities as directed today . Call provider office for new or worsened signs and symptoms New or worsened symptom related to chronic medical conditions . Call care management team with questions or concerns . Verbalize basic understanding of patient centered plan of care established today  Interventions related to  Infantile Cerebral Palsy with cognitive deficits, GAD, illiteracy, apraxia of speech, angina, hx of MI, CAD, dyslipidemia,B12 deficiency, eczema,DDD,Depression,HTN,COPD, eczema:  . Evaluation of current treatment plans and patient's adherence to plan as established by provider . Assessed patient understanding of disease states . Assessed patient's education and care coordination needs . Provided disease specific education to patient  . Collaborated with appropriate clinical care team members regarding patient needs  Patient Self Care Activities related to  Infantile Cerebral Palsy with cognitive deficits, GAD, illiteracy, apraxia of speech, angina, hx of MI, CAD, dyslipidemia,B12 deficiency, eczema,DDD,Depression,HTN,COPD, eczema:  . Patient is unable to independently self-manage chronic health conditions  Initial goal documentation        Follow-up Plan:   The care management team will reach out to the patient again over the next 30 days.   [name], BSN, RN-BC Embedded Chronic Care Manager [facility] [place] [facility] Direct Dial: [phone]

## 2019-06-29 NOTE — Patient Instructions (Signed)
Visit Information  Goals Addressed            This Visit's Progress   . Chronic Disease Management Needs        Current Barriers:  . Chronic Disease Management support, education, and care coordination needs related to  Infantile Cerebral Palsy with cognitive deficits, GAD, illiteracy, apraxia of speech, angina, hx of MI, CAD, dyslipidemia,B12 deficiency, eczema,DDD,Depression,HTN,COPD, eczema . Literacy . transportation  Clinical Goal(s) related to  Infantile Cerebral Palsy with cognitive deficits, GAD, illiteracy, apraxia of speech, angina, hx of MI, CAD, dyslipidemia,B12 deficiency, eczema,DDD,Depression,HTN,COPD, eczema:  Over the next 60 days, patient will:  . Work with the care management team to address educational, disease management, and care coordination needs  . Begin or continue self health monitoring activities as directed today . Call provider office for new or worsened signs and symptoms New or worsened symptom related to chronic medical conditions . Call care management team with questions or concerns . Verbalize basic understanding of patient centered plan of care established today  Interventions related to  Infantile Cerebral Palsy with cognitive deficits, GAD, illiteracy, apraxia of speech, angina, hx of MI, CAD, dyslipidemia,B12 deficiency, eczema,DDD,Depression,HTN,COPD, eczema:  . Evaluation of current treatment plans and patient's adherence to plan as established by provider . Assessed patient understanding of disease states . Assessed patient's education and care coordination needs . Provided disease specific education to patient  . Collaborated with appropriate clinical care team members regarding patient needs  Patient Self Care Activities related to  Infantile Cerebral Palsy with cognitive deficits, GAD, illiteracy, apraxia of speech, angina, hx of MI, CAD, dyslipidemia,B12 deficiency, eczema,DDD,Depression,HTN,COPD, eczema:  . Patient is unable to  independently self-manage chronic health conditions  Initial goal documentation        The patient verbalized understanding of instructions provided today and declined a print copy of patient instruction materials.   Follow-up Plan The care management team will reach out to the patient again over the next 30 days.   [name], BSN, RN-BC Embedded Chronic Care Manager [facility] / [place] Management Direct Dial: [phone]

## 2019-06-29 NOTE — Chronic Care Management (AMB) (Signed)
  Chronic Care Management   Outreach Note  01/17/2019 Name: [name] MRN: [id number] DOB: 12-23-1955  Referred by: [name], MD Reason for referral : Chronic Care Management (RN follow up)   An unsuccessful telephone outreach was attempted today. The patient was referred to the case management team for assistance with care management and care coordination.   Follow Up Plan: A HIPPA compliant phone message was left for the patient providing contact information and requesting a return call.  The care management team will reach out to the patient again over the next 30 days.   [name], BSN, RN-BC Embedded Chronic Care Manager [facility] / [place] Management Direct Dial: [phone]

## 2019-07-05 ENCOUNTER — Ambulatory Visit: Payer: Medicare Other | Admitting: *Deleted

## 2019-07-05 DIAGNOSIS — G809 Cerebral palsy, unspecified: Secondary | ICD-10-CM

## 2019-07-05 DIAGNOSIS — I25118 Atherosclerotic heart disease of native coronary artery with other forms of angina pectoris: Secondary | ICD-10-CM

## 2019-07-05 DIAGNOSIS — I10 Essential (primary) hypertension: Secondary | ICD-10-CM

## 2019-07-05 NOTE — Chronic Care Management (AMB) (Signed)
Chronic Care Management   Follow Up Note   07/05/2019 Name: [name] MRN: [id number] DOB: 1955/10/05  Referred by: [name], MD Reason for referral : Chronic Care Management (RN follow up)   [name] is a 64 y.o. year old male who is a primary care patient of [name], [name], MD. The CCM team was consulted for assistance with chronic disease management and care coordination needs.    Review of patient status, including review of consultants reports, relevant laboratory and other test results, and collaboration with appropriate care team members and the patient's provider was performed as part of comprehensive patient evaluation and provision of chronic care management services.    Subjective: I reached out to [name] by telephone today to follow-up on his chronic care needs. He is concerned that his PCS aid is not coming when she is supposed to and she is not staying as long as she is scheduled. We also discussed weight management and headache improvement.   SDOH (Social Determinants of Health) assessments performed: No See Care Plan activities for detailed interventions related to [name])     Outpatient Encounter Medications as of 07/05/2019  Medication Sig  . acetaminophen (TYLENOL) 325 MG tablet Take 2 tablets (650 mg total) by mouth every 6 (six) hours as needed for mild pain or headache.  . albuterol (PROAIR HFA) 108 (90 Base) MCG/ACT inhaler 1-2 puffs every 6 hours as needed wheezing or shortness of breath.  [name] aspirin EC 81 MG tablet Take 1 tablet (81 mg total) by mouth daily.  [name] atorvastatin (LIPITOR) 40 MG tablet TAKE 1 TABLET ONCE DAILY AT 6PM  . buPROPion (WELLBUTRIN SR) 150 MG 12 hr tablet Take 1 tablet (150 mg total) by mouth daily with breakfast.  . butalbital-acetaminophen-caffeine (ESGIC) 50-325-40 MG tablet Take 1 tablet by mouth every 6 (six) hours as needed for headache.  . celecoxib (CELEBREX) 200 MG capsule Take 1 capsule (200 mg total) by mouth daily  as needed.  . cetaphil (CETAPHIL) lotion Apply 1 application topically 2 (two) times daily. For rash. Apply after washing with [facility] shower gel and rinsing with clear warm water.  . DULoxetine (CYMBALTA) 60 MG capsule Take 1 capsule (60 mg total) by mouth daily. WITH A FULL STOMACH AT SUPPER TIME  . fexofenadine (ALLEGRA) 180 MG tablet Take 1 tablet (180 mg total) by mouth daily. For allergy symptoms  . fluocinonide-emollient (LIDEX-E) 0.05 % cream Apply 1 application topically 2 (two) times daily. To affected areas  . fluticasone (FLONASE) 50 MCG/ACT nasal spray Place 2 sprays into both nostrils daily as needed for allergies or rhinitis.  . fluticasone furoate-vilanterol (BREO ELLIPTA) 100-25 MCG/INH AEPB Inhale 1 puff into the lungs daily.  . Ipratropium-Albuterol (COMBIVENT RESPIMAT) 20-100 MCG/ACT AERS respimat Inhale 1 puff into the lungs every 6 (six) hours  . isosorbide mononitrate (IMDUR) 60 MG 24 hr tablet Take 1 tablet (60 mg total) by mouth daily.  . metoprolol tartrate (LOPRESSOR) 25 MG tablet Take 1 tablet (25 mg total) by mouth 2 (two) times daily.  . mirtazapine (REMERON) 15 MG tablet Take 1 tablet (15 mg total) by mouth at bedtime. For sleep  . nitroGLYCERIN (NITROSTAT) 0.4 MG SL tablet Place 1 tablet (0.4 mg total) under the tongue every 5 (five) minutes x 3 doses as needed for chest pain.  . pantoprazole (PROTONIX) 40 MG tablet TAKE  (1)  TABLET TWICE A DAY.  [name] predniSONE (STERAPRED UNI-PAK 48 TAB) 10 MG (48) TBPK tablet Use  as directed  . tamsulosin (FLOMAX) 0.4 MG CAPS capsule Take 2 capsules (0.8 mg total) by mouth daily after supper.   Facility-Administered Encounter Medications as of 07/05/2019  Medication  . cyanocobalamin ((VITAMIN B-12)) injection 1,000 mcg     Objective:  BMI Readings from Last 3 Encounters:  06/27/19 32.03 kg/m  05/16/19 32.46 kg/m  04/10/19 32.19 kg/m     RN Assessment & Care Plan    Obesity in patient with hypertension, CAD, prior  MI, and COPD  "I want to lose some of this weight" (pt-stated)       Current Barriers:  . Literacy barriers  . Knowledge deficit related to proper weight management techniques  Nurse Case Manager Clinical Goal(s):  [name] Over the next 30 days, patient will verbalize understanding of plan for weight loss  . Over the next 30 days patient will state understanding of need for weight management and it's health benefits . Over the next 60 days patient will experience a weight loss of 4 to 8 pounds.    Interventions:   Discussed current diet  Talked with patient by telephone  States, "I am eating like the heart doctor told me to"  Reports that he has lost some weight but unsure of how much. His close fit better.   Recommended portion control  Recommended lean protein, complex carbs (fruits, vegetables, whole grains, nuts)  Decrease simple carbs like white sugar, white bread, candy, soda, etc  Encouraged patient to continue with diet modification  Previously provided with CCM contact information and encouraged to reach out as needed  Patient Self Care Activities:  . Currently UNABLE TO independently perform all ADLs independently . Self administers medications as prescribed (in pill pack) . Calls provider office for new concerns or questions    Please see past updates related to this goal by clicking on the "Past Updates" button in the selected goal        . "I want to make sure my aid is coming like she's supposed to" (pt-stated)       Current Barriers:  . Film/video editor.  . Literacy barriers . Cognitive Deficits  Nurse Case Manager Clinical Goal(s):  [name] Over the next 30 days, patient will verify that personal care aid is coming to his home as often as she is supposed to  Interventions:  . Talked with patient about his concerns o Feels that his aid is not coming as often or staying as long as they are supposed to. He is frustrated and feels like discontinuing services  all together.  o Has reached out to [facility] with the number provided, but has been unable to reach anyone. States that it sounds like it's a disconnected number. Has not asked his sister for assistance with the telephone call.  o Requests that I call [facility] and ask them to reach out to him . Reached out to [employer] [phone] on the patient's behalf . Previously provided patient with CCM contact information and he reaches out as needed  Patient Self Care Activities:  . Currently UNABLE TO independently perform all ADLs or IADls . Calls office with questions  Please see past updates related to this goal by clicking on the "Past Updates" button in the selected goal      . Headache (pt-stated)       [place] (see longtitudinal plan of care for additional care plan information)  New onset of persistent headache in patient with hypertension, cerebral palsy, and  seizure disorder.   Current Barriers:  [name] Knowledge Deficits related to cause and treatment of headache . Literacy barriers . Transportation  Nurse Case Manager Clinical Goal(s):  [name] Over the next 30 days, patient will see resolution of headaches  Interventions:  . Chart reviewed including recent office notes and labs . Talked with patient by telephone o Reports that headache duration and severity has improved . Reviewed and discussed medications: Esgic and prednisone.  o Taking prednisone as prescribed and Esgic every 6 hours as needed. Does not have to take 4 times a day.  . Encouraged patient to reach out to CCM as needed . Encouraged patient to reach out to PCP if symptoms worsen or if new symptoms develop  Patient Self Care Activities:  . Patient verbalizes understanding of plan to assess headache . Performs ADL's independently . Unable to independently drive or read  Please see past updates related to this goal by clicking on the "Past Updates" button in the selected goal            Follow-up Plan:   The care management team will reach out to the patient again over the next 30 days.    [name], BSN, RN-BC Embedded Chronic Care Manager [facility] / [place] Management Direct Dial: [phone]

## 2019-07-05 NOTE — Patient Instructions (Signed)
Visit Information  Goals Addressed            This Visit's Progress     Patient Stated   . "I want to lose some of this weight" (pt-stated)       Obesity in patient with HTN, CAD, Prior MI, and COPD  Current Barriers:  . Literacy barriers  . Knowledge deficit related to proper weight management techniques  Nurse Case Manager Clinical Goal(s):  [name] Over the next 30 days, patient will verbalize understanding of plan for weight loss  . Over the next 30 days patient will state understanding of need for weight management and it's health benefits . Over the next 60 days patient will experience a weight loss of 4 to 8 pounds.    Interventions:   Discussed current diet  Talked with patient by telephone  States, "I am eating like the heart doctor told me to"  Reports that he has lost some weight but unsure of how much. His close fit better.   Recommended portion control  Recommended lean protein, complex carbs (fruits, vegetables, whole grains, nuts)  Decrease simple carbs like white sugar, white bread, candy, soda, etc  Encouraged patient to continue with diet modification  Previously provided with CCM contact information and encouraged to reach out as needed  Patient Self Care Activities:  . Currently UNABLE TO independently perform all ADLs independently . Self administers medications as prescribed (in pill pack) . Calls provider office for new concerns or questions    Please see past updates related to this goal by clicking on the "Past Updates" button in the selected goal        . "I want to make sure my aid is coming like she's supposed to" (pt-stated)       Current Barriers:  . Film/video editor.  . Literacy barriers . Cognitive Deficits  Nurse Case Manager Clinical Goal(s):  [name] Over the next 30 days, patient will verify that personal care aid is coming to his home as often as she is supposed to  Interventions:  . Talked with patient about his  concerns o Feels that his aid is not coming as often or staying as long as they are supposed to. He is frustrated and feels like discontinuing services all together.  o Has reached out to [facility] with the number provided, but has been unable to reach anyone. States that it sounds like it's a disconnected number. Has not asked his sister for assistance with the telephone call.  o Requests that I call [facility] and ask them to reach out to him . Reached out to [employer] [phone] on the patient's behalf . Previously provided patient with CCM contact information and he reaches out as needed  Patient Self Care Activities:  . Currently UNABLE TO independently perform all ADLs or IADls . Calls office with questions  Please see past updates related to this goal by clicking on the "Past Updates" button in the selected goal      . Headache (pt-stated)       [place] (see longtitudinal plan of care for additional care plan information)  New onset of persistent headache in patient with hypertension, cerebral palsy, and seizure disorder.   Current Barriers:  [name] Knowledge Deficits related to cause and treatment of headache . Literacy barriers . Transportation  Nurse Case Manager Clinical Goal(s):  [name] Over the next 30 days, patient will see resolution of headaches  Interventions:  . Chart reviewed including  recent office notes and labs . Talked with patient by telephone o Reports that headache duration and severity has improved . Reviewed and discussed medications: Esgic and prednisone.  o Taking prednisone as prescribed and Esgic every 6 hours as needed. Does not have to take 4 times a day.  . Encouraged patient to reach out to CCM as needed . Encouraged patient to reach out to PCP if symptoms worsen or if new symptoms develop  Patient Self Care Activities:  . Patient verbalizes understanding of plan to assess headache . Performs ADL's  independently . Unable to independently drive or read  Please see past updates related to this goal by clicking on the "Past Updates" button in the selected goal         The patient verbalized understanding of instructions provided today and declined a print copy of patient instruction materials.   Follow-up Plan The care management team will reach out to the patient again over the next 30 days.   [name], BSN, RN-BC Embedded Chronic Care Manager [facility] / [place] Management Direct Dial: [phone]

## 2019-07-06 ENCOUNTER — Ambulatory Visit: Payer: Self-pay | Admitting: Licensed Clinical Social Worker

## 2019-07-06 DIAGNOSIS — F411 Generalized anxiety disorder: Secondary | ICD-10-CM

## 2019-07-06 DIAGNOSIS — J441 Chronic obstructive pulmonary disease with (acute) exacerbation: Secondary | ICD-10-CM

## 2019-07-06 DIAGNOSIS — F329 Major depressive disorder, single episode, unspecified: Secondary | ICD-10-CM

## 2019-07-06 DIAGNOSIS — M5136 Other intervertebral disc degeneration, lumbar region: Secondary | ICD-10-CM

## 2019-07-06 DIAGNOSIS — I25118 Atherosclerotic heart disease of native coronary artery with other forms of angina pectoris: Secondary | ICD-10-CM

## 2019-07-06 DIAGNOSIS — I10 Essential (primary) hypertension: Secondary | ICD-10-CM

## 2019-07-06 DIAGNOSIS — R482 Apraxia: Secondary | ICD-10-CM

## 2019-07-06 DIAGNOSIS — F32A Depression, unspecified: Secondary | ICD-10-CM

## 2019-07-06 NOTE — Patient Instructions (Addendum)
Licensed Clinical Social Worker Visit Information  Goals we discussed today:   .  Improve Insufficient Self Care Secondary to Grief (pt-stated)   Current Barriers:  Grief reaction to sister's death in patient with Chronic Diagnoses of COPD, Apaxia of Speech, CAD, HTN, GAD, DDD, Depression  depression Nurse Case Manager Clinical Goal(s):  Over the next 30 days, patient will continue to talk with [name] regarding grief secondary to sister's death  Over the next 30 days, patient will work with RN to improve oral intake in presence of decreased appetite secondary to grief process Interventions:  RNCM has encouraged patient to continue talking with [name]  [name] previously talked with client about grief issues related to death of his older sister  [name] previously provided counseling support for client  Talked with Taeshaun about stress issues facing client  Talked with Micky about in home care services   Patient Self Care Activities:  Performs ADL's independently  Unable to perform IADLs independently. Has not have transportation arranged.    Materials Provided: No  Follow Up Plan:[name] to call client in next 4 weeks to talk with client about his management of grief issues by client  The patient verbalized understanding of instructions provided today and declined a print copy of patient instruction materials.   [name].Chizuko Trine MSW, [name] Licensed Clinical Social Worker [facility] Care Management [phone]

## 2019-07-06 NOTE — Chronic Care Management (AMB) (Signed)
Care Management Note   [name] is a 64 y.o. year old male who is a primary care patient of [name], [name], MD. The CM team was consulted for assistance with chronic disease management and care coordination.   I reached out to [place] by phone today   Review of patient status, including review of consultants reports, relevant laboratory and other test results, and collaboration with appropriate care team members and the patient's provider was performed as part of comprehensive patient evaluation and provision of chronic care management services.   Social determinants of health: risk of tobacco use; risk of stress; risk of depression; transport challenges    Chronic Care Management from 02/21/2019 in [place]  PHQ-9 Total Score  9     GAD 7 : Generalized Anxiety Score 04/10/2019 05/01/2016 03/31/2016  Nervous, Anxious, on Edge 0 1 3  Control/stop worrying 0 1 3  Worry too much - different things 0 1 3  Trouble relaxing 0 0 2  Restless 0 0 2  Easily annoyed or irritable 0 0 0  Afraid - awful might happen 0 0 0  Total GAD 7 Score 0 3 13  Anxiety Difficulty Not difficult at all Not difficult at all Not difficult at all   Medications   (very important)  New medications from outside sources are available for reconciliation  Outpatient Medications acetaminophen (TYLENOL) 325 MG tablet albuterol (PROAIR HFA) 108 (90 Base) MCG/ACT inhaler aspirin EC 81 MG tablet atorvastatin (LIPITOR) 40 MG tablet buPROPion (WELLBUTRIN SR) 150 MG 12 hr tablet butalbital-acetaminophen-caffeine (ESGIC) 50-325-40 MG tablet celecoxib (CELEBREX) 200 MG capsule cetaphil (CETAPHIL) lotion DULoxetine (CYMBALTA) 60 MG capsule fexofenadine (ALLEGRA) 180 MG tablet fluocinonide-emollient (LIDEX-E) 0.05 % cream fluticasone (FLONASE) 50 MCG/ACT nasal spray fluticasone furoate-vilanterol (BREO ELLIPTA) 100-25 MCG/INH AEPB Ipratropium-Albuterol (COMBIVENT RESPIMAT) 20-100  MCG/ACT AERS respimat isosorbide mononitrate (IMDUR) 60 MG 24 hr tablet metoprolol tartrate (LOPRESSOR) 25 MG tablet mirtazapine (REMERON) 15 MG tablet nitroGLYCERIN (NITROSTAT) 0.4 MG SL tablet pantoprazole (PROTONIX) 40 MG tablet predniSONE (STERAPRED UNI-PAK 48 TAB) 10 MG (48) TBPK tablet tamsulosin (FLOMAX) 0.4 MG CAPS capsule  Clinic-Administered Medications cyanocobalamin ((VITAMIN B-12)) injection 1,000 mcg  Goals                 . Improve Insufficient Self Care Secondary to Grief (pt-stated)      Current Barriers:   Grief reaction to sister's death in patient with Chronic Diagnoses of COPD, Apaxia of Speech, CAD, HTN, GAD, DDD, Depression  depression  Nurse Case Manager Clinical Goal(s):   Over the next 30 days, patient will continue to talk with [name] regarding grief secondary to sister's death  Over the next 30 days, patient will work with RN to improve oral intake in presence of decreased appetite secondary to grief process  Interventions:  RNCM has encouraged patient to continue talking with [name] [name] previously talked with client about grief issues related to death of his older sister [name] previously provided counseling support for client Talked with [name] about stress issues facing client Talked with [name] about in home care services  Patient Self Care Activities:   Performs ADL's independently  Unable to perform IADLs independently. Has not have transportation arranged.   Initial goal documentation     Follow Up Plan: [name] to call client in next 4 weeks to talk with client about his management of grief issues by client  [name].Lynne Takemoto MSW, [name] Licensed Clinical Social Worker [facility] Care Management  [phone] 

## 2019-07-18 ENCOUNTER — Ambulatory Visit: Payer: Self-pay | Admitting: Licensed Clinical Social Worker

## 2019-07-18 ENCOUNTER — Telehealth: Payer: Self-pay | Admitting: *Deleted

## 2019-07-18 ENCOUNTER — Ambulatory Visit: Payer: Medicare Other | Admitting: *Deleted

## 2019-07-18 DIAGNOSIS — M5136 Other intervertebral disc degeneration, lumbar region: Secondary | ICD-10-CM

## 2019-07-18 DIAGNOSIS — J441 Chronic obstructive pulmonary disease with (acute) exacerbation: Secondary | ICD-10-CM | POA: Diagnosis not present

## 2019-07-18 DIAGNOSIS — I25118 Atherosclerotic heart disease of native coronary artery with other forms of angina pectoris: Secondary | ICD-10-CM

## 2019-07-18 DIAGNOSIS — I10 Essential (primary) hypertension: Secondary | ICD-10-CM

## 2019-07-18 DIAGNOSIS — F32A Depression, unspecified: Secondary | ICD-10-CM

## 2019-07-18 DIAGNOSIS — F329 Major depressive disorder, single episode, unspecified: Secondary | ICD-10-CM | POA: Diagnosis not present

## 2019-07-18 DIAGNOSIS — F411 Generalized anxiety disorder: Secondary | ICD-10-CM

## 2019-07-18 DIAGNOSIS — R482 Apraxia: Secondary | ICD-10-CM

## 2019-07-18 DIAGNOSIS — G4489 Other headache syndrome: Secondary | ICD-10-CM

## 2019-07-18 NOTE — Chronic Care Management (AMB) (Signed)
  Chronic Care Management   Care Coordination Note   07/18/2019 Name: BIRCH SCHETTER MRN: [id number] DOB: 1955/04/26  Referred by: [name], MD Reason for referral : Chronic Care Management (Care Coordination)   LAUDIE STEININGER is a 64 y.o. year old male who is a primary care patient of [name], [name], MD. The CCM team was consulted for assistance with chronic disease management and care coordination needs.     RN Care Plan   . Headache (pt-stated)       CARE PLAN ENTRY (see longtitudinal plan of care for additional care plan information)  New onset of persistent headache in patient with hypertension, cerebral palsy, and seizure disorder.   Current Barriers:  [name] Knowledge Deficits related to cause and treatment of headache . Literacy barriers . Transportation  Nurse Case Manager Clinical Goal(s):  [name] Over the next 30 days, patient will work with PCP to manage daily headaches  Interventions:  . Chart reviewed including recent office notes and labs . Consulted by [name], [name] who spoke with patient directly today o Patient reported that he continues to have daily headaches despite taking medications that were prescribed . Prior conversation with patient and he reported then that his headaches were improving in severity, frequency, and duration . Message taken and forwarded to Dr Verlin Grills covering provider to review and provide recommendations since Dr Livia Snellen is out of the office this week  Patient Self Care Activities:  . Patient verbalizes understanding of plan to assess headache . Performs ADL's independently . Unable to independently drive or read  Please see past updates related to this goal by clicking on the "Past Updates" button in the selected goal          Plan:   The care management team will reach out to the patient again over the next 7 days.    [name], BSN, RN-BC Embedded Chronic Care Manager [facility] / [place]  Management Direct Dial: [phone]

## 2019-07-18 NOTE — Telephone Encounter (Addendum)
07/18/2019  I received a call from [name], [name] who talked with [name] directly today. [name] still complains of daily headaches. He has completed the prednisone and is taking the Esgic. When I last spoke with him on 07/05/19 he reported that headaches had improved in severity, frequency, and duration. Dr Livia Snellen is out of the office this week but since [name] complained of a persistent daily headache again today, I will forward to covering provider for review and recommendation.   [name], BSN, RN-BC Embedded Chronic Care Manager [facility] / [place] Management Direct Dial: [phone]

## 2019-07-18 NOTE — Patient Instructions (Signed)
Visit Information  Goals Addressed            This Visit's Progress     Patient Stated   . Headache (pt-stated)       CARE PLAN ENTRY (see longtitudinal plan of care for additional care plan information)  New onset of persistent headache in patient with hypertension, cerebral palsy, and seizure disorder.   Current Barriers:  [name] Knowledge Deficits related to cause and treatment of headache . Literacy barriers . Transportation  Nurse Case Manager Clinical Goal(s):  [name] Over the next 30 days, patient will work with PCP to manage daily headaches  Interventions:  . Chart reviewed including recent office notes and labs . Consulted by [name], [name] who spoke with patient directly today o Patient reported that he continues to have daily headaches despite taking medications that were prescribed . Prior conversation with patient and he reported then that his headaches were improving in severity, frequency, and duration . Message taken and forwarded to Dr Verlin Grills covering provider to review and provide recommendations since Dr Livia Snellen is out of the office this week  Patient Self Care Activities:  . Patient verbalizes understanding of plan to assess headache . Performs ADL's independently . Unable to independently drive or read  Please see past updates related to this goal by clicking on the "Past Updates" button in the selected goal          Follow-up Plan The care management team will reach out to the patient again over the next 7 days.   [name], BSN, RN-BC Embedded Chronic Care Manager [facility] / [place] Management Direct Dial: [phone]

## 2019-07-18 NOTE — Telephone Encounter (Signed)
Needs appointment with DR. [name] next week when he gets back

## 2019-07-18 NOTE — Chronic Care Management (AMB) (Signed)
Care Management Note   [name] is a 64 y.o. year old male who is a primary care patient of [name], [name], MD. The CM team was consulted for assistance with chronic disease management and care coordination.   I reached out to [name] by phone today.  Review of patient status, including review of consultants reports, relevant laboratory and other test results, and collaboration with appropriate care team members and the patient's provider was performed as part of comprehensive patient evaluation and provision of chronic care management services.   Social determinants of health:risk of tobacco use; risk of depression; risk of stress; risk of transport needs    Chronic Care Management from 02/21/2019 in [place]  PHQ-9 Total Score  9     GAD 7 : Generalized Anxiety Score 04/10/2019 05/01/2016 03/31/2016  Nervous, Anxious, on Edge 0 1 3  Control/stop worrying 0 1 3  Worry too much - different things 0 1 3  Trouble relaxing 0 0 2  Restless 0 0 2  Easily annoyed or irritable 0 0 0  Afraid - awful might happen 0 0 0  Total GAD 7 Score 0 3 13  Anxiety Difficulty Not difficult at all Not difficult at all Not difficult at all   Medications   (very important)  New medications from outside sources are available for reconciliation  Outpatient Medications acetaminophen (TYLENOL) 325 MG tablet albuterol (PROAIR HFA) 108 (90 Base) MCG/ACT inhaler aspirin EC 81 MG tablet atorvastatin (LIPITOR) 40 MG tablet buPROPion (WELLBUTRIN SR) 150 MG 12 hr tablet butalbital-acetaminophen-caffeine (ESGIC) 50-325-40 MG tablet celecoxib (CELEBREX) 200 MG capsule cetaphil (CETAPHIL) lotion DULoxetine (CYMBALTA) 60 MG capsule fexofenadine (ALLEGRA) 180 MG tablet fluocinonide-emollient (LIDEX-E) 0.05 % cream fluticasone (FLONASE) 50 MCG/ACT nasal spray fluticasone furoate-vilanterol (BREO ELLIPTA) 100-25 MCG/INH AEPB Ipratropium-Albuterol (COMBIVENT RESPIMAT) 20-100  MCG/ACT AERS respimat isosorbide mononitrate (IMDUR) 60 MG 24 hr tablet metoprolol tartrate (LOPRESSOR) 25 MG tablet mirtazapine (REMERON) 15 MG tablet nitroGLYCERIN (NITROSTAT) 0.4 MG SL tablet pantoprazole (PROTONIX) 40 MG tablet predniSONE (STERAPRED UNI-PAK 48 TAB) 10 MG (48) TBPK tablet tamsulosin (FLOMAX) 0.4 MG CAPS capsule  Clinic-Administered Medications cyanocobalamin ((VITAMIN B-12)) injection 1,000 mcg  Goals Addressed            This Visit's Progress   . Improve Insufficient Self Care Secondary to Grief (pt-stated)       Current Barriers:  [name] Grief reaction to sister's death in patient with Chronic Diagnoses of COPD, Apaxia of Speech, CAD, HTN, GAD, DDD, Depression . depression  Nurse Case Manager Clinical Goal(s):  [name] Over the next 30 days, patient will continue to talk with [name] regarding grief secondary to sister's death . Over the next 30 days, patient will work with RN to improve oral intake in presence of decreased appetite secondary to grief process  Interventions:  RNCM has encouraged patient to continue talking with [name] [name] previously talked with client about grief issues related to death of his older sister [name] previously provided counseling support for client Talked with [name] about in home care services Arranged transport assistance for cliient with [name] for 07/31/2019 client appointment at [name] Talked with client about pain issues of client Collaborated with RNCM to discuss nursing needs of client  Patient Self Care Activities:  . Performs ADL's independently . Unable to perform IADLs independently. Has not have transportation arranged.   Initial goal documentation   Follow Up Plan:[name] to call client in next 4 weeks to talk with client about his  management of grief issues by client  [name].[name] MSW, [name] Licensed Clinical Social Worker [facility] Care Management [phone]

## 2019-07-18 NOTE — Patient Instructions (Addendum)
Licensed Clinical Social Worker Visit Information  Goals we discussed today:  Goals Addressed            This Visit's Progress   . Improve Insufficient Self Care Secondary to Grief (pt-stated)       Current Barriers:  [name] Grief reaction to sister's death in patient with Chronic Diagnoses of COPD, Apaxia of Speech, CAD, HTN, GAD, DDD, Depression . depression  Nurse Case Manager Clinical Goal(s):  [name] Over the next 30 days, patient will continue to talk with [name] regarding grief secondary to sister's death . Over the next 30 days, patient will work with RN to improve oral intake in presence of decreased appetite secondary to grief process  Interventions:  RNCM has encouraged patient to continue talking with [name] LCSWpreviouslytalked with client about grief issues related to death of his older sister LCSWpreviouslyprovided counseling support for client Talked with Lelon about in home care services Arranged transport assistance for cliient with [name] for 07/31/2019 client appointment at [facility] Talked with client about pain issues of client Collaborated with RNCM to discuss nursing needs of client  Patient Self Care Activities:  . Performs ADL's independently . Unable to perform IADLs independently. Has not have transportation arranged.   Initial goal documentation       Materials Provided: No  Follow Up Plan:[name] to call client in next 4 weeks to talk with client about his management of grief issues by client  The patient verbalized understanding of instructions provided today and declined a print copy of patient instruction materials.   [name].Xylan Sheils MSW, [name] Licensed Clinical Social Worker [facility] Care Management [phone]

## 2019-07-19 ENCOUNTER — Ambulatory Visit: Payer: Medicare Other | Admitting: *Deleted

## 2019-07-19 DIAGNOSIS — G40909 Epilepsy, unspecified, not intractable, without status epilepticus: Secondary | ICD-10-CM

## 2019-07-19 DIAGNOSIS — I10 Essential (primary) hypertension: Secondary | ICD-10-CM

## 2019-07-19 NOTE — Chronic Care Management (AMB) (Signed)
Chronic Care Management   Follow Up Note   07/19/2019 Name: [name] MRN: [id number] DOB: 11/19/1955  Referred by: [name], MD Reason for referral : Chronic Care Management (RN follow up)   [name] is a 64 y.o. year old male who is a primary care patient of [name], [name], MD. The CCM team was consulted for assistance with chronic disease management and care coordination needs.    Review of patient status, including review of consultants reports, relevant laboratory and other test results, and collaboration with appropriate care team members and the patient's provider was performed as part of comprehensive patient evaluation and provision of chronic care management services.     I spoke with [name] by telephone today. He again complains of a persistent headache.   Outpatient Encounter Medications as of 07/19/2019  Medication Sig  . acetaminophen (TYLENOL) 325 MG tablet Take 2 tablets (650 mg total) by mouth every 6 (six) hours as needed for mild pain or headache.  . albuterol (PROAIR HFA) 108 (90 Base) MCG/ACT inhaler 1-2 puffs every 6 hours as needed wheezing or shortness of breath.  [name] aspirin EC 81 MG tablet Take 1 tablet (81 mg total) by mouth daily.  [name] atorvastatin (LIPITOR) 40 MG tablet TAKE 1 TABLET ONCE DAILY AT 6PM  . buPROPion (WELLBUTRIN SR) 150 MG 12 hr tablet Take 1 tablet (150 mg total) by mouth daily with breakfast.  . butalbital-acetaminophen-caffeine (ESGIC) 50-325-40 MG tablet Take 1 tablet by mouth every 6 (six) hours as needed for headache.  . celecoxib (CELEBREX) 200 MG capsule Take 1 capsule (200 mg total) by mouth daily as needed.  . cetaphil (CETAPHIL) lotion Apply 1 application topically 2 (two) times daily. For rash. Apply after washing with [facility] shower gel and rinsing with clear warm water.  . DULoxetine (CYMBALTA) 60 MG capsule Take 1 capsule (60 mg total) by mouth daily. WITH A FULL STOMACH AT SUPPER TIME  . fexofenadine (ALLEGRA)  180 MG tablet Take 1 tablet (180 mg total) by mouth daily. For allergy symptoms  . fluocinonide-emollient (LIDEX-E) 0.05 % cream Apply 1 application topically 2 (two) times daily. To affected areas  . fluticasone (FLONASE) 50 MCG/ACT nasal spray Place 2 sprays into both nostrils daily as needed for allergies or rhinitis.  . fluticasone furoate-vilanterol (BREO ELLIPTA) 100-25 MCG/INH AEPB Inhale 1 puff into the lungs daily.  . Ipratropium-Albuterol (COMBIVENT RESPIMAT) 20-100 MCG/ACT AERS respimat Inhale 1 puff into the lungs every 6 (six) hours  . isosorbide mononitrate (IMDUR) 60 MG 24 hr tablet Take 1 tablet (60 mg total) by mouth daily.  . metoprolol tartrate (LOPRESSOR) 25 MG tablet Take 1 tablet (25 mg total) by mouth 2 (two) times daily.  . mirtazapine (REMERON) 15 MG tablet Take 1 tablet (15 mg total) by mouth at bedtime. For sleep  . nitroGLYCERIN (NITROSTAT) 0.4 MG SL tablet Place 1 tablet (0.4 mg total) under the tongue every 5 (five) minutes x 3 doses as needed for chest pain.  . pantoprazole (PROTONIX) 40 MG tablet TAKE  (1)  TABLET TWICE A DAY.  [name] predniSONE (STERAPRED UNI-PAK 48 TAB) 10 MG (48) TBPK tablet Use as directed  . tamsulosin (FLOMAX) 0.4 MG CAPS capsule Take 2 capsules (0.8 mg total) by mouth daily after supper.   Facility-Administered Encounter Medications as of 07/19/2019  Medication  . cyanocobalamin ((VITAMIN B-12)) injection 1,000 mcg      RN Care Plan   . Headache (pt-stated)  CARE PLAN ENTRY (see longtitudinal plan of care for additional care plan information)  New onset of persistent headache in patient with hypertension, cerebral palsy, and seizure disorder.   Current Barriers:  [name] Knowledge Deficits related to cause and treatment of headache . Literacy barriers . Transportation  Nurse Case Manager Clinical Goal(s):  [name] Over the next 30 days, patient will work with PCP to manage daily headaches  Interventions:  . Chart reviewed including recent  office notes and labs o [facility] clinical staff reached out to patient yesterday but had to leave a voicemail o Patient needs to be seen by Dr Livia Snellen next week . Talked with patient by telephone . Discuss HPI o Headache improved while taking prednisone and Esgic but returned after d/c of prednisone. Is not finding Esgic as helpful now.  . Scheduled a televisit with Dr Livia Snellen for Monday 07/24/19 at 12:55. Patient aware and is expecting telephone call.  . Encouraged patient to reach out to PCP with any new or worsening symptoms or seek emergency medical care if needed . Advised patient to reach out to [facility] team as needed  Patient Self Care Activities:  . Patient verbalizes understanding of plan to assess headache . Performs ADL's independently . Unable to independently drive or read  Please see past updates related to this goal by clicking on the "Past Updates" button in the selected goal          Plan:  Follow up with PCP on 07/24/19. Appt scheduled for 12:55. CCM team will follow-up by telephone over the next 30 days     [name], BSN, RN-BC [place] [facility] [place] Management Direct Dial: [phone]

## 2019-07-19 NOTE — Telephone Encounter (Signed)
07/19/2019  Talked with patient by telephone. Discussed HPI. Scheduled patient for televisit with Dr Livia Snellen on Monday, 07/24/19 at 12:55. Advised patient to reach out to PCP sooner with any new or worsening symptoms or to seek emergency medical care if necessary.   Patient stated understanding and agreement to plan.   [name], BSN, RN-BC Embedded Chronic Care Manager [facility] / [place] Management Direct Dial: [phone]

## 2019-07-19 NOTE — Patient Instructions (Signed)
Visit Information  Goals Addressed            This Visit's Progress     Patient Stated   . Headache (pt-stated)       CARE PLAN ENTRY (see longtitudinal plan of care for additional care plan information)  New onset of persistent headache in patient with hypertension, cerebral palsy, and seizure disorder.   Current Barriers:  [name] Knowledge Deficits related to cause and treatment of headache . Literacy barriers . Transportation  Nurse Case Manager Clinical Goal(s):  [name] Over the next 30 days, patient will work with PCP to manage daily headaches  Interventions:  . Chart reviewed including recent office notes and labs o [facility] clinical staff reached out to patient yesterday but had to leave a voicemail o Patient needs to be seen by Dr Livia Snellen next week . Talked with patient by telephone . Discuss HPI o Headache improved while taking prednisone and Esgic but returned after d/c of prednisone. Is not finding Esgic as helpful now.  . Scheduled a televisit with Dr Livia Snellen for Monday 07/24/19 at 12:55. Patient aware and is expecting telephone call.  . Encouraged patient to reach out to PCP with any new or worsening symptoms or seek emergency medical care if needed . Advised patient to reach out to [facility] team as needed  Patient Self Care Activities:  . Patient verbalizes understanding of plan to assess headache . Performs ADL's independently . Unable to independently drive or read  Please see past updates related to this goal by clicking on the "Past Updates" button in the selected goal         The patient verbalized understanding of instructions provided today and declined a print copy of patient instruction materials.   Follow-up Plan The care management team will reach out to the patient again over the next 30 days.   [name], BSN, RN-BC Embedded Chronic Care Manager [facility] / [place] Management Direct Dial: [phone]

## 2019-07-19 NOTE — Telephone Encounter (Signed)
lmtcb

## 2019-07-24 ENCOUNTER — Ambulatory Visit (INDEPENDENT_AMBULATORY_CARE_PROVIDER_SITE_OTHER): Payer: Medicare Other | Admitting: *Deleted

## 2019-07-24 ENCOUNTER — Encounter: Payer: Self-pay | Admitting: Family Medicine

## 2019-07-24 ENCOUNTER — Telehealth: Payer: Medicare Other

## 2019-07-24 ENCOUNTER — Ambulatory Visit (INDEPENDENT_AMBULATORY_CARE_PROVIDER_SITE_OTHER): Payer: Medicare Other | Admitting: Family Medicine

## 2019-07-24 DIAGNOSIS — G4452 New daily persistent headache (NDPH): Secondary | ICD-10-CM

## 2019-07-24 DIAGNOSIS — I10 Essential (primary) hypertension: Secondary | ICD-10-CM

## 2019-07-24 NOTE — Patient Instructions (Signed)
Visit Information  Goals Addressed            This Visit's Progress     Patient Stated   . Headache (pt-stated)       CARE PLAN ENTRY (see longtitudinal plan of care for additional care plan information)  New onset of persistent headache in patient with hypertension, cerebral palsy, and seizure disorder.   Current Barriers:  [name] Knowledge Deficits related to cause and treatment of headache . Literacy barriers . Transportation  Nurse Case Manager Clinical Goal(s):  [name] Over the next 7 days, patient will have CT scan of head ordered by PCP . Over the next 2 days, patient will have transportation arranged for CT scan  Interventions:  . Chart reviewed including recent office notes and labs o CT head ordered . Incoming call from patient requesting that we arrange transportation for CT next Tuesday . Upcoming appointments reviewed o CT scheduled for 5/4 . Consulted with [facility] referral coordinator/scheduler. This was first available at [facility].  . Requested that she move appointment to [facility] location and schedule for 3 to 7 days from now so that we can arrange transportation . Referral coordinator/scheduler will give me the appointment details tomorrow so that I can arrange transportation through [facility] and notify [facility]  Patient Self Care Activities:  . Patient verbalizes understanding of plan to assess headache . Performs ADL's independently . Unable to independently drive or read  Please see past updates related to this goal by clicking on the "Past Updates" button in the selected goal         The patient verbalized understanding of instructions provided today and declined a print copy of patient instruction materials.   Follow-up Plan The care management team will reach out to the patient again over the next 2 days.   [name], BSN, RN-BC Embedded Chronic Care Manager [facility] / [place] Management Direct Dial: [phone]

## 2019-07-24 NOTE — Chronic Care Management (AMB) (Signed)
  Chronic Care Management   Care Coordination Note   07/24/2019 Name: [name] MRN: [id number] DOB: [birth year]  Referred by: [name], MD Reason for referral : Chronic Care Management (Incoming patient call and care coordination)   [name] is a 64 y.o. year old male who is a primary care patient of [name], [name], MD. The CCM team was consulted for assistance with chronic disease management and care coordination needs.    Review of patient status, including review of consultants reports, relevant laboratory and other test results, and collaboration with appropriate care team members and the patient's provider was performed as part of comprehensive patient evaluation and provision of chronic care management services.    RN Care Plan   . Headache (pt-stated)       CARE PLAN ENTRY (see longtitudinal plan of care for additional care plan information)  New onset of persistent headache in patient with hypertension, cerebral palsy, and seizure disorder.   Current Barriers:  [name] Knowledge Deficits related to cause and treatment of headache . Literacy barriers . Transportation  Nurse Case Manager Clinical Goal(s):  [name] Over the next 7 days, patient will have CT scan of head ordered by PCP . Over the next 2 days, patient will have transportation arranged for CT scan  Interventions:  . Chart reviewed including recent office notes and labs o CT head ordered . Incoming call from patient requesting that we arrange transportation for CT next Tuesday . Upcoming appointments reviewed o CT scheduled for 5/4 . Consulted with [facility] referral coordinator/scheduler. This was first available at [facility].  . Requested that she move appointment to [facility] location and schedule for 3 to 7 days from now so that we can arrange transportation . Referral coordinator/scheduler will give me the appointment details tomorrow so that I can arrange transportation through [facility] and notify  [facility]  Patient Self Care Activities:  . Patient verbalizes understanding of plan to assess headache . Performs ADL's independently . Unable to independently drive or read  Please see past updates related to this goal by clicking on the "Past Updates" button in the selected goal          Follow-up Plan:   The care management team will reach out to the patient again over the next 2 days.   [name], BSN, RN-BC Embedded Chronic Care Manager [facility] / [place] Management Direct Dial: [phone]

## 2019-07-24 NOTE — Progress Notes (Signed)
Subjective:    Patient ID: [name], male    DOB: 05/04/1955, 64 y.o.   MRN: [id number]   HPI: [name] is a 64 y.o. male presenting for headache coming back after he took all the medicine prescibed at visit of 06/27/19. Now HA daily on awakening. Also having falls in which legs fold up underneath him X 3 last week. Headche feels like needle sticking in the head above the nose. Last until bedtime. Real bad. Can be a 10 at times.   Depression screen [facility] 2/9 06/27/2019 05/16/2019 04/10/2019 02/23/2019 11/01/2018  Decreased Interest 0 0 0 2 0  Down, Depressed, Hopeless 0 0 0 2 0  PHQ - 2 Score 0 0 0 4 0  Altered sleeping - - - 0 -  Tired, decreased energy - - - 1 -  Change in appetite - - - 2 -  Feeling bad or failure about yourself  - - - 1 -  Trouble concentrating - - - 1 -  Moving slowly or fidgety/restless - - - 0 -  Suicidal thoughts - - - 0 -  PHQ-9 Score - - - 9 -  Difficult doing work/chores - - - Very difficult -  Some recent data might be hidden     Relevant past medical, surgical, family and social history reviewed and updated as indicated.  Interim medical history since our last visit reviewed. Allergies and medications reviewed and updated.  ROS:  Review of Systems  Constitutional: Negative for fever.  Respiratory: Negative for shortness of breath.   Cardiovascular: Negative for chest pain.  Musculoskeletal: Negative for arthralgias.  Skin: Negative for rash.     Social History   Tobacco Use  Smoking Status Former Smoker  . Packs/day: 0.50  . Years: 41.00  . Pack years: 20.50  . Types: Cigarettes  . Quit date: 06/06/2018  . Years since quitting: 1.1  Smokeless Tobacco Never Used  Tobacco Comment   Patient quit on his own in February 2020       Objective:     Wt Readings from Last 3 Encounters:  06/27/19 242 lb 12.8 oz (110.1 kg)  05/16/19 246 lb (111.6 kg)  04/10/19 244 lb (110.7 kg)     Exam deferred. Pt. Harboring due to COVID 19.  Phone visit performed.   Assessment & Plan:   1. New daily persistent headache     No orders of the defined types were placed in this encounter.   Orders Placed This Encounter  Procedures  . MR Brain W Wo Contrast    Standing Status:   Future    Standing Expiration Date:   10/23/2019    Order Specific Question:   If indicated for the ordered procedure, I authorize the administration of contrast media per Radiology protocol    Answer:   Yes    Order Specific Question:   What is the patient's sedation requirement?    Answer:   No Sedation    Order Specific Question:   Does the patient have a pacemaker or implanted devices?    Answer:   No    Order Specific Question:   Radiology Contrast Protocol - do NOT remove file path    Answer:   [email or web address]    Order Specific Question:   Preferred imaging location?    Answer:   [facility] (table limit-350lbs)      Diagnoses and all orders for this visit:  New daily persistent headache -  MR Brain W Wo Contrast; Future    Virtual Visit via telephone Note  I discussed the limitations, risks, security and privacy concerns of performing an evaluation and management service by telephone and the availability of in person appointments. The patient was identified with two identifiers. Pt.expressed understanding and agreed to proceed. Pt. Is at home. Dr. Livia Snellen is in his office.  Follow Up Instructions:   I discussed the assessment and treatment plan with the patient. The patient was provided an opportunity to ask questions and all were answered. The patient agreed with the plan and demonstrated an understanding of the instructions.   The patient was advised to call back or seek an in-person evaluation if the symptoms worsen or if the condition fails to improve as anticipated.   Total minutes including chart review and phone contact time: 14   Follow up plan: Return in about 2 weeks (around  08/07/2019).  [name], MD [place]

## 2019-07-25 ENCOUNTER — Ambulatory Visit: Payer: Medicare Other | Admitting: *Deleted

## 2019-07-25 ENCOUNTER — Ambulatory Visit: Payer: Self-pay | Admitting: Licensed Clinical Social Worker

## 2019-07-25 DIAGNOSIS — I252 Old myocardial infarction: Secondary | ICD-10-CM | POA: Diagnosis not present

## 2019-07-25 DIAGNOSIS — M51369 Other intervertebral disc degeneration, lumbar region without mention of lumbar back pain or lower extremity pain: Secondary | ICD-10-CM

## 2019-07-25 DIAGNOSIS — F329 Major depressive disorder, single episode, unspecified: Secondary | ICD-10-CM | POA: Diagnosis not present

## 2019-07-25 DIAGNOSIS — G4452 New daily persistent headache (NDPH): Secondary | ICD-10-CM

## 2019-07-25 DIAGNOSIS — G809 Cerebral palsy, unspecified: Secondary | ICD-10-CM

## 2019-07-25 DIAGNOSIS — M5136 Other intervertebral disc degeneration, lumbar region: Secondary | ICD-10-CM

## 2019-07-25 DIAGNOSIS — I10 Essential (primary) hypertension: Secondary | ICD-10-CM

## 2019-07-25 DIAGNOSIS — R482 Apraxia: Secondary | ICD-10-CM

## 2019-07-25 DIAGNOSIS — J441 Chronic obstructive pulmonary disease with (acute) exacerbation: Secondary | ICD-10-CM

## 2019-07-25 DIAGNOSIS — I25118 Atherosclerotic heart disease of native coronary artery with other forms of angina pectoris: Secondary | ICD-10-CM

## 2019-07-25 DIAGNOSIS — F32A Depression, unspecified: Secondary | ICD-10-CM

## 2019-07-25 DIAGNOSIS — F411 Generalized anxiety disorder: Secondary | ICD-10-CM

## 2019-07-25 NOTE — Chronic Care Management (AMB) (Signed)
Care Management Note   [name] is a 64 y.o. year old male who is a primary care patient of [name], [name], MD. The CM team was consulted for assistance with chronic disease management and care coordination.   I reached out to [place] by phone today.    Review of patient status, including review of consultants reports, relevant laboratory and other test results, and collaboration with appropriate care team members and the patient's provider was performed as part of comprehensive patient evaluation and provision of chronic care management services.   Social Determinants of health: risk of tobacco use; risk of depression; risk of stress; risk of transport needs    Chronic Care Management from 02/21/2019 in [place]  PHQ-9 Total Score  9     GAD 7 : Generalized Anxiety Score 04/10/2019 05/01/2016 03/31/2016  Nervous, Anxious, on Edge 0 1 3  Control/stop worrying 0 1 3  Worry too much - different things 0 1 3  Trouble relaxing 0 0 2  Restless 0 0 2  Easily annoyed or irritable 0 0 0  Afraid - awful might happen 0 0 0  Total GAD 7 Score 0 3 13  Anxiety Difficulty Not difficult at all Not difficult at all Not difficult at all   Medications   (very important)  New medications from outside sources are available for reconciliation  Outpatient Medications acetaminophen (TYLENOL) 325 MG tablet albuterol (PROAIR HFA) 108 (90 Base) MCG/ACT inhaler aspirin EC 81 MG tablet atorvastatin (LIPITOR) 40 MG tablet buPROPion (WELLBUTRIN SR) 150 MG 12 hr tablet butalbital-acetaminophen-caffeine (ESGIC) 50-325-40 MG tablet celecoxib (CELEBREX) 200 MG capsule cetaphil (CETAPHIL) lotion DULoxetine (CYMBALTA) 60 MG capsule fexofenadine (ALLEGRA) 180 MG tablet fluocinonide-emollient (LIDEX-E) 0.05 % cream fluticasone (FLONASE) 50 MCG/ACT nasal spray fluticasone furoate-vilanterol (BREO ELLIPTA) 100-25 MCG/INH AEPB Ipratropium-Albuterol (COMBIVENT RESPIMAT)  20-100 MCG/ACT AERS respimat isosorbide mononitrate (IMDUR) 60 MG 24 hr tablet metoprolol tartrate (LOPRESSOR) 25 MG tablet mirtazapine (REMERON) 15 MG tablet nitroGLYCERIN (NITROSTAT) 0.4 MG SL tablet pantoprazole (PROTONIX) 40 MG tablet predniSONE (STERAPRED UNI-PAK 48 TAB) 10 MG (48) TBPK tablet tamsulosin (FLOMAX) 0.4 MG CAPS capsule  Clinic-Administered Medications cyanocobalamin ((VITAMIN B-12)) injection 1,000 mcg  Goals Addressed            This Visit's Progress   . COMPLETED: "I need a ride to get my B12 shot" (pt-stated)       Current Barriers:  . Film/video editor.  . Literacy barriers . Transportation barriers in client with Chronic Diagnoses of DDD, Depressin Apraxia of Speech, CAD GAD, Hx MI, HTN, Infantile Cerebral Palsy  . Cognitive Deficits  Nurse Case Manager Clinical Goal(s):  [name] Over the next 20 days, patient will have transportation arranged for B12 injection that is scheduled for 07/31/2019  Interventions:  . Chart review . [name] has called [name] to arrange transport for client for 07/31/2019 B12 injection at [facility] . [name] has also called [name] and arranged transport for client's scheduled MRI at [facility] radiology department for 08/07/2019 (client to arrive at hospital at 9:30 AM on 08/07/2019) . [name] reviewed with client his upcoming appointments and transport arrangements to these appointments . Collaborated with RNCM to discuss MRI appointment scheduled for client for 08/07/2019.   Patient Self Care Activities:  . Currently UNABLE TO independently drive to medical appointments.   Please see past updates related to this goal by clicking on the "Past Updates" button in the selected goal       Follow  Up Plan: [name] to call client in next 4 weeks to assess social work needs of client at that time  Chaquille Purdum.Jaqwan Wieber MSW, [name] Licensed Clinical Social Worker [facility] Care Management [phone]

## 2019-07-25 NOTE — Chronic Care Management (AMB) (Signed)
Chronic Care Management   Care Coordination Note   07/25/2019 Name: [name] MRN: [id number] DOB: 11-Feb-1956  Referred by: [name], MD Reason for referral : Chronic Care Management (Care coordination)   [name] is a 64 y.o. year old male who is a primary care patient of [name], [name], MD. The CCM team was consulted for assistance with chronic disease management and care coordination needs.    Review of patient status, including review of consultants reports, relevant laboratory and other test results, and collaboration with appropriate care team members and the patient's provider was performed as part of comprehensive patient evaluation and provision of chronic care management services.    SDOH (Social Determinants of Health) assessments performed: No See Care Plan activities for detailed interventions related to [name])     Outpatient Encounter Medications as of 07/25/2019  Medication Sig  . acetaminophen (TYLENOL) 325 MG tablet Take 2 tablets (650 mg total) by mouth every 6 (six) hours as needed for mild pain or headache.  . albuterol (PROAIR HFA) 108 (90 Base) MCG/ACT inhaler 1-2 puffs every 6 hours as needed wheezing or shortness of breath.  [name] aspirin EC 81 MG tablet Take 1 tablet (81 mg total) by mouth daily.  [name] atorvastatin (LIPITOR) 40 MG tablet TAKE 1 TABLET ONCE DAILY AT 6PM  . buPROPion (WELLBUTRIN SR) 150 MG 12 hr tablet Take 1 tablet (150 mg total) by mouth daily with breakfast.  . butalbital-acetaminophen-caffeine (ESGIC) 50-325-40 MG tablet Take 1 tablet by mouth every 6 (six) hours as needed for headache.  . celecoxib (CELEBREX) 200 MG capsule Take 1 capsule (200 mg total) by mouth daily as needed.  . cetaphil (CETAPHIL) lotion Apply 1 application topically 2 (two) times daily. For rash. Apply after washing with [facility] shower gel and rinsing with clear warm water.  . DULoxetine (CYMBALTA) 60 MG capsule Take 1 capsule (60 mg total) by mouth daily. WITH  A FULL STOMACH AT SUPPER TIME  . fexofenadine (ALLEGRA) 180 MG tablet Take 1 tablet (180 mg total) by mouth daily. For allergy symptoms  . fluocinonide-emollient (LIDEX-E) 0.05 % cream Apply 1 application topically 2 (two) times daily. To affected areas  . fluticasone (FLONASE) 50 MCG/ACT nasal spray Place 2 sprays into both nostrils daily as needed for allergies or rhinitis.  . fluticasone furoate-vilanterol (BREO ELLIPTA) 100-25 MCG/INH AEPB Inhale 1 puff into the lungs daily.  . Ipratropium-Albuterol (COMBIVENT RESPIMAT) 20-100 MCG/ACT AERS respimat Inhale 1 puff into the lungs every 6 (six) hours  . isosorbide mononitrate (IMDUR) 60 MG 24 hr tablet Take 1 tablet (60 mg total) by mouth daily.  . metoprolol tartrate (LOPRESSOR) 25 MG tablet Take 1 tablet (25 mg total) by mouth 2 (two) times daily.  . mirtazapine (REMERON) 15 MG tablet Take 1 tablet (15 mg total) by mouth at bedtime. For sleep  . nitroGLYCERIN (NITROSTAT) 0.4 MG SL tablet Place 1 tablet (0.4 mg total) under the tongue every 5 (five) minutes x 3 doses as needed for chest pain.  . pantoprazole (PROTONIX) 40 MG tablet TAKE  (1)  TABLET TWICE A DAY.  [name] predniSONE (STERAPRED UNI-PAK 48 TAB) 10 MG (48) TBPK tablet Use as directed  . tamsulosin (FLOMAX) 0.4 MG CAPS capsule Take 2 capsules (0.8 mg total) by mouth daily after supper.   Facility-Administered Encounter Medications as of 07/25/2019  Medication  . cyanocobalamin ((VITAMIN B-12)) injection 1,000 mcg     Objective:   RN Care Plan   . Headache  CARE PLAN ENTRY (see longtitudinal plan of care for additional care plan information)  New onset of persistent headache in patient with hypertension, cerebral palsy, and seizure disorder.   Current Barriers:  [name] Knowledge Deficits related to cause and treatment of headache . Literacy barriers . Transportation  Nurse Case Manager Clinical Goal(s):  [name] Patient will present for MRI of head at [facility] on  08/07/2019 . Over the next 15 days, patient will work with PCP to manage persistent headaches  Interventions:  . Chart reviewed, including recent office notes and orders . Collaborated with [facility] referral coordinator o Patient scheduled for MRI at [facility] on 4/19 at 10:00 and needs to arrive at 9:30 . Communicated appointment details with [name] so that he can arrange transportation  . [name] notified patient of appointment and transportation details . Patient previously given CCM contact information and encouraged to reach out as needed.   Patient Self Care Activities:  . Patient verbalizes understanding of plan to assess headache . Performs ADL's independently . Unable to independently drive or read  Please see past updates related to this goal by clicking on the "Past Updates" button in the selected goal          Plan:   The care management team will reach out to the patient again over the next 15 days.    [name], BSN, RN-BC Embedded Chronic Care Manager [facility] / [place] Management Direct Dial: [phone]

## 2019-07-25 NOTE — Patient Instructions (Signed)
Visit Information  Goals Addressed            This Visit's Progress     Patient Stated   . Headache (pt-stated)       CARE PLAN ENTRY (see longtitudinal plan of care for additional care plan information)  New onset of persistent headache in patient with hypertension, cerebral palsy, and seizure disorder.   Current Barriers:  [name] Knowledge Deficits related to cause and treatment of headache . Literacy barriers . Transportation  Nurse Case Manager Clinical Goal(s):  [name] Patient will present for MRI of head at [facility] on 08/07/2019 . Over the next 15 days, patient will work with PCP to manage persistent headaches  Interventions:  . Chart reviewed, including recent office notes and orders . Collaborated with [facility] referral coordinator o Patient scheduled for MRI at [facility] on 4/19 at 10:00 and needs to arrive at 9:30 . Communicated appointment details with [name] so that he can arrange transportation  . [name] notified patient of appointment and transportation details . Patient previously given CCM contact information and encouraged to reach out as needed.   Patient Self Care Activities:  . Patient verbalizes understanding of plan to assess headache . Performs ADL's independently . Unable to independently drive or read  Please see past updates related to this goal by clicking on the "Past Updates" button in the selected goal         Follow-up Plan The care management team will reach out to the patient again over the next 15 days.   [name], BSN, RN-BC Embedded Chronic Care Manager [facility] / [place] Management Direct Dial: [phone]

## 2019-07-25 NOTE — Patient Instructions (Addendum)
Licensed Clinical Social Worker Visit Information  Goals we discussed today:  Goals Addressed            This Visit's Progress   . COMPLETED: "I need a ride to get my B12 shot" (pt-stated)       Current Barriers:  . Film/video editor.  . Literacy barriers . Transportation barriers in client with Chronic Diagnoses of DDD, Depressin Apraxia of Speech, CAD GAD, Hx MI, HTN, Infantile Cerebral Palsy  . Cognitive Deficits  Nurse Case Manager Clinical Goal(s):  [name] Over the next 20 days, patient will have transportation arranged for B12 injection that is scheduled for 07/31/2019  Interventions:    Chart review  [name] has called [name] to arrange transport for client for 07/31/2019 B12 injection at [facility]  [name] has also called [name] and arranged transport for client's scheduled MRI at [facility] radiology department for 08/07/2019 (client to arrive at hospital at 9:30 AM on 08/07/2019)  [name] reviewed with client his upcoming appointments and transport arrangements to these appointments . Collaborated with RNCM to discuss MRI appointment scheduled for client for 08/07/2019.   Patient Self Care Activities:  . Currently UNABLE TO independently drive to medical appointments.   Please see past updates related to this goal by clicking on the "Past Updates" button in the selected goal        Materials Provided: No  Follow Up Plan: [name] to call client in next 4 weeks to assess social work needs of client at that time  The patient verbalized understanding of instructions provided today and declined a print copy of patient instruction materials.   [name].Chane Magner MSW, [name] Licensed Clinical Social Worker [facility] Care Management [phone]

## 2019-07-31 ENCOUNTER — Ambulatory Visit (INDEPENDENT_AMBULATORY_CARE_PROVIDER_SITE_OTHER): Payer: Medicare Other

## 2019-07-31 ENCOUNTER — Other Ambulatory Visit: Payer: Self-pay

## 2019-07-31 DIAGNOSIS — E538 Deficiency of other specified B group vitamins: Secondary | ICD-10-CM

## 2019-07-31 NOTE — Progress Notes (Signed)
Cyanocobalamin injection given to left deltoid.  Patient tolerated well. 

## 2019-08-02 ENCOUNTER — Ambulatory Visit: Payer: Self-pay | Admitting: Licensed Clinical Social Worker

## 2019-08-02 ENCOUNTER — Ambulatory Visit: Payer: Medicare Other | Admitting: *Deleted

## 2019-08-02 DIAGNOSIS — F32A Depression, unspecified: Secondary | ICD-10-CM

## 2019-08-02 DIAGNOSIS — F411 Generalized anxiety disorder: Secondary | ICD-10-CM

## 2019-08-02 DIAGNOSIS — M5136 Other intervertebral disc degeneration, lumbar region: Secondary | ICD-10-CM

## 2019-08-02 DIAGNOSIS — M51369 Other intervertebral disc degeneration, lumbar region without mention of lumbar back pain or lower extremity pain: Secondary | ICD-10-CM

## 2019-08-02 DIAGNOSIS — F329 Major depressive disorder, single episode, unspecified: Secondary | ICD-10-CM

## 2019-08-02 DIAGNOSIS — I25118 Atherosclerotic heart disease of native coronary artery with other forms of angina pectoris: Secondary | ICD-10-CM

## 2019-08-02 DIAGNOSIS — G809 Cerebral palsy, unspecified: Secondary | ICD-10-CM

## 2019-08-02 DIAGNOSIS — I10 Essential (primary) hypertension: Secondary | ICD-10-CM

## 2019-08-02 DIAGNOSIS — I252 Old myocardial infarction: Secondary | ICD-10-CM

## 2019-08-02 DIAGNOSIS — R482 Apraxia: Secondary | ICD-10-CM

## 2019-08-02 NOTE — Chronic Care Management (AMB) (Signed)
  Chronic Care Management   Follow-up Note  08/02/2019 Name: [name] MRN: [id number] DOB: 12/06/1955  Referred by: [name], MD Reason for referral : Chronic Care Management (RN follow up)   An unsuccessful telephone follow-up was attempted today. The patient was referred to the case management team for assistance with care management and care coordination.   Follow Up Plan: A HIPPA compliant phone message was left for the patient providing contact information and requesting a return call.  The care management team will reach out to the patient again over the next 5 days.   [name], BSN, RN-BC Embedded Chronic Care Manager [facility] / [place] Management Direct Dial: [phone]

## 2019-08-02 NOTE — Chronic Care Management (AMB) (Signed)
Chronic Care Management    Clinical Social Work Follow Up Note  08/02/2019 Name: [name] MRN: [id number] DOB: 03/29/1956  [name] is a 64 y.o. year old male who is a primary care patient of [name], [name], MD. The CCM team was consulted for assistance with [name] .   Review of patient status, including review of consultants reports, other relevant assessments, and collaboration with appropriate care team members and the patient's provider was performed as part of comprehensive patient evaluation and provision of chronic care management services.    SDOH (Social Determinants of Health) assessments performed: Yes. Risk of transportation needs; risk of tobacco use; risk of depression    Chronic Care Management from 02/21/2019 in [place]  PHQ-9 Total Score  9     GAD 7 : Generalized Anxiety Score 04/10/2019 05/01/2016 03/31/2016  Nervous, Anxious, on Edge 0 1 3  Control/stop worrying 0 1 3  Worry too much - different things 0 1 3  Trouble relaxing 0 0 2  Restless 0 0 2  Easily annoyed or irritable 0 0 0  Afraid - awful might happen 0 0 0  Total GAD 7 Score 0 3 13  Anxiety Difficulty Not difficult at all Not difficult at all Not difficult at all    Outpatient Encounter Medications as of 08/02/2019  Medication Sig  . acetaminophen (TYLENOL) 325 MG tablet Take 2 tablets (650 mg total) by mouth every 6 (six) hours as needed for mild pain or headache.  . albuterol (PROAIR HFA) 108 (90 Base) MCG/ACT inhaler 1-2 puffs every 6 hours as needed wheezing or shortness of breath.  [name] aspirin EC 81 MG tablet Take 1 tablet (81 mg total) by mouth daily.  [name] atorvastatin (LIPITOR) 40 MG tablet TAKE 1 TABLET ONCE DAILY AT 6PM  . buPROPion (WELLBUTRIN SR) 150 MG 12 hr tablet Take 1 tablet (150 mg total) by mouth daily with breakfast.  . butalbital-acetaminophen-caffeine (ESGIC) 50-325-40 MG tablet Take 1 tablet by mouth every 6 (six) hours as needed for  headache.  . celecoxib (CELEBREX) 200 MG capsule Take 1 capsule (200 mg total) by mouth daily as needed.  . cetaphil (CETAPHIL) lotion Apply 1 application topically 2 (two) times daily. For rash. Apply after washing with [facility] shower gel and rinsing with clear warm water.  . DULoxetine (CYMBALTA) 60 MG capsule Take 1 capsule (60 mg total) by mouth daily. WITH A FULL STOMACH AT SUPPER TIME  . fexofenadine (ALLEGRA) 180 MG tablet Take 1 tablet (180 mg total) by mouth daily. For allergy symptoms  . fluocinonide-emollient (LIDEX-E) 0.05 % cream Apply 1 application topically 2 (two) times daily. To affected areas  . fluticasone (FLONASE) 50 MCG/ACT nasal spray Place 2 sprays into both nostrils daily as needed for allergies or rhinitis.  . fluticasone furoate-vilanterol (BREO ELLIPTA) 100-25 MCG/INH AEPB Inhale 1 puff into the lungs daily.  . Ipratropium-Albuterol (COMBIVENT RESPIMAT) 20-100 MCG/ACT AERS respimat Inhale 1 puff into the lungs every 6 (six) hours  . isosorbide mononitrate (IMDUR) 60 MG 24 hr tablet Take 1 tablet (60 mg total) by mouth daily.  . metoprolol tartrate (LOPRESSOR) 25 MG tablet Take 1 tablet (25 mg total) by mouth 2 (two) times daily.  . mirtazapine (REMERON) 15 MG tablet Take 1 tablet (15 mg total) by mouth at bedtime. For sleep  . nitroGLYCERIN (NITROSTAT) 0.4 MG SL tablet Place 1 tablet (0.4 mg total) under the tongue every 5 (five) minutes x 3 doses  as needed for chest pain.  . pantoprazole (PROTONIX) 40 MG tablet TAKE  (1)  TABLET TWICE A DAY.  [name] predniSONE (STERAPRED UNI-PAK 48 TAB) 10 MG (48) TBPK tablet Use as directed  . tamsulosin (FLOMAX) 0.4 MG CAPS capsule Take 2 capsules (0.8 mg total) by mouth daily after supper.   Facility-Administered Encounter Medications as of 08/02/2019  Medication  . cyanocobalamin ((VITAMIN B-12)) injection 1,000 mcg    Goals    . "I need transportation to come in and get my B12 shot" (pt-stated)     Transportation needs in  patient with infantile cerebral palsy, hypertension, and hx of MI  Current Barriers:  . Film/video editor.  . Literacy barriers . Transportation barriers in patient with Chronic Diagnoses of HTN, GAD, Hx of MI, CAD, Apraxia of Speech, DDD, Depression  Nurse Case Manager Clinical Goal(s):  [name] Over the next 20 days, patient will work with [name] to arrange [name] transport for client . To and from his next B12 injection appointment at [facility]   Interventions:  . Talked with client about his transport assistance to last B12 injection appointment at [facility] on 07/31/2019.  . Talked with client about [name] transport arranged for client for his 08/07/2019 appointment for MRI at [facility] in [place], [place] . Talked with client about fact that his Medicaid benefit is paying for his 08/07/2019 medical transport assistance to 08/07/19 MRI appointment . Talked with client about his family support (sister).   Patient Self Care Activities:  . Performs most ADLs but does have in home Aide and has help form his sisters for most all IADLs   Please see past updates related to this goal by clicking on the "Past Updates" button in the selected goal    Follow Up Plan: [name] to call client in next 4 weeks to talk with client about social work needs of client at that time  Shari Zeedyk.Adiel Erney MSW, [name] Licensed Clinical Social Worker [facility] Care Management [phone]

## 2019-08-02 NOTE — Patient Instructions (Addendum)
Licensed Clinical Social Worker Visit Information  Goals we discussed today:  Goals    . "I need transportation to come in and get my B12 shot" (pt-stated)     Transportation needs in patient with infantile cerebral palsy, hypertension, and hx of MI  Current Barriers:  . Film/video editor.  . Literacy barriers . Transportation barriers in patient with Chronic Diagnoses of HTN, GAD, Hx of MI, CAD, Apraxia of Speech, DDD, Depression  Nurse Case Manager Clinical Goal(s):  [name] Over the next 2 days, patient will work with [name] to have transportation arranged for his appointment at [facility] on 06/27/19  Interventions:   Talked with client about his transport assistance to last B12 injection appointment at [facility] on 07/31/2019.   Talked with client about [name] transport arranged for client for his 08/07/2019 appointment for MRI at [facility] in [place], [place]  Talked with client about fact that his Medicaid benefit is paying for his 08/07/2019 medical transport assistance to 08/07/19 MRI appointment  Talked with client about his family support (sister).   Talked with client about recent client fall  Patient Self Care Activities:  . Performs most ADLs but does have in home Aide and has help form his sisters for most all IADLs  Please see past updates related to this goal by clicking on the "Past Updates" button in the selected goal     Materials Provided: No  Follow Up Plan: [name] to call client in next 4 weeks to talk with client about social work needs of client at that time  The patient verbalized understanding of instructions provided today and declined a print copy of patient instruction materials.   [name].Inga Noller MSW, [name] Licensed Clinical Social Worker [facility] Care Management [phone]

## 2019-08-04 ENCOUNTER — Ambulatory Visit: Payer: Medicare Other | Admitting: *Deleted

## 2019-08-04 DIAGNOSIS — I10 Essential (primary) hypertension: Secondary | ICD-10-CM

## 2019-08-04 DIAGNOSIS — G4452 New daily persistent headache (NDPH): Secondary | ICD-10-CM

## 2019-08-04 NOTE — Patient Instructions (Signed)
Visit Information  Goals Addressed            This Visit's Progress     Patient Stated   . Headache (pt-stated)       CARE PLAN ENTRY (see longtitudinal plan of care for additional care plan information)  New onset of persistent headache in patient with hypertension, cerebral palsy, and seizure disorder.   Current Barriers:  [name] Knowledge Deficits related to cause and treatment of headache . Literacy barriers . Transportation  Nurse Case Manager Clinical Goal(s):  [name] Patient will present for MRI of head at [facility] on 08/07/2019 . Over the next 15 days, patient will work with PCP to manage persistent headaches  Interventions:  . Chart reviewed, including recent office notes and orders . Talked with patient and his sister by telephone . Confirmed that he is aware of appointment on Monday at [facility] for MRI . Questions answered . Contacted [name] [phone] to cancel transportation because his sister is going to take him . Advised that PCP will f/u with him regarding results when they are available . Encouraged patient to reach out to PCP and CCM team as needed  Patient Self Care Activities:  . Patient verbalizes understanding of plan to assess headache . Performs ADL's independently . Unable to independently drive or read  Please see past updates related to this goal by clicking on the "Past Updates" button in the selected goal         The patient verbalized understanding of instructions provided today and declined a print copy of patient instruction materials.   Follow-up Plan The care management team will reach out to the patient again over the next 7 days.   [name], BSN, RN-BC Embedded Chronic Care Manager [facility] / [place] [facility] Direct Dial: [phone]

## 2019-08-04 NOTE — Chronic Care Management (AMB) (Signed)
Chronic Care Management   Follow Up Note   08/04/2019 Name: [name] MRN: [id number] DOB: 11/03/1955  Referred by: [name], MD Reason for referral : Chronic Care Management (RN Follow up)   [name] is a 64 y.o. year old male who is a primary care patient of [name], [name], MD. The CCM team was consulted for assistance with chronic disease management and care coordination needs.    Review of patient status, including review of consultants reports, relevant laboratory and other test results, and collaboration with appropriate care team members and the patient's provider was performed as part of comprehensive patient evaluation and provision of chronic care management services.    SDOH (Social Determinants of Health) assessments performed: No See Care Plan activities for detailed interventions related to [facility])     Outpatient Encounter Medications as of 08/04/2019  Medication Sig  . acetaminophen (TYLENOL) 325 MG tablet Take 2 tablets (650 mg total) by mouth every 6 (six) hours as needed for mild pain or headache.  . albuterol (PROAIR HFA) 108 (90 Base) MCG/ACT inhaler 1-2 puffs every 6 hours as needed wheezing or shortness of breath.  [name] aspirin EC 81 MG tablet Take 1 tablet (81 mg total) by mouth daily.  [name] atorvastatin (LIPITOR) 40 MG tablet TAKE 1 TABLET ONCE DAILY AT 6PM  . buPROPion (WELLBUTRIN SR) 150 MG 12 hr tablet Take 1 tablet (150 mg total) by mouth daily with breakfast.  . butalbital-acetaminophen-caffeine (ESGIC) 50-325-40 MG tablet Take 1 tablet by mouth every 6 (six) hours as needed for headache.  . celecoxib (CELEBREX) 200 MG capsule Take 1 capsule (200 mg total) by mouth daily as needed.  . cetaphil (CETAPHIL) lotion Apply 1 application topically 2 (two) times daily. For rash. Apply after washing with [facility] shower gel and rinsing with clear warm water.  . DULoxetine (CYMBALTA) 60 MG capsule Take 1 capsule (60 mg total) by mouth daily. WITH A FULL  STOMACH AT SUPPER TIME  . fexofenadine (ALLEGRA) 180 MG tablet Take 1 tablet (180 mg total) by mouth daily. For allergy symptoms  . fluocinonide-emollient (LIDEX-E) 0.05 % cream Apply 1 application topically 2 (two) times daily. To affected areas  . fluticasone (FLONASE) 50 MCG/ACT nasal spray Place 2 sprays into both nostrils daily as needed for allergies or rhinitis.  . fluticasone furoate-vilanterol (BREO ELLIPTA) 100-25 MCG/INH AEPB Inhale 1 puff into the lungs daily.  . Ipratropium-Albuterol (COMBIVENT RESPIMAT) 20-100 MCG/ACT AERS respimat Inhale 1 puff into the lungs every 6 (six) hours  . isosorbide mononitrate (IMDUR) 60 MG 24 hr tablet Take 1 tablet (60 mg total) by mouth daily.  . metoprolol tartrate (LOPRESSOR) 25 MG tablet Take 1 tablet (25 mg total) by mouth 2 (two) times daily.  . mirtazapine (REMERON) 15 MG tablet Take 1 tablet (15 mg total) by mouth at bedtime. For sleep  . nitroGLYCERIN (NITROSTAT) 0.4 MG SL tablet Place 1 tablet (0.4 mg total) under the tongue every 5 (five) minutes x 3 doses as needed for chest pain.  . pantoprazole (PROTONIX) 40 MG tablet TAKE  (1)  TABLET TWICE A DAY.  [name] predniSONE (STERAPRED UNI-PAK 48 TAB) 10 MG (48) TBPK tablet Use as directed  . tamsulosin (FLOMAX) 0.4 MG CAPS capsule Take 2 capsules (0.8 mg total) by mouth daily after supper.   Facility-Administered Encounter Medications as of 08/04/2019  Medication  . cyanocobalamin ((VITAMIN B-12)) injection 1,000 mcg     RN Care Plan   . Headache  CARE PLAN ENTRY (see longtitudinal plan of care for additional care plan information)  New onset of persistent headache in patient with hypertension, cerebral palsy, and seizure disorder.   Current Barriers:  [name] Knowledge Deficits related to cause and treatment of headache . Literacy barriers . Transportation  Nurse Case Manager Clinical Goal(s):  [name] Patient will present for MRI of head at [facility] on 08/07/2019 . Over the next 15  days, patient will work with PCP to manage persistent headaches  Interventions:  . Chart reviewed, including recent office notes and orders . Talked with patient and his sister by telephone . Confirmed that he is aware of appointment on Monday at [facility] for MRI . Questions answered . Contacted [name] [phone] to cancel transportation because his sister is going to take him . Advised that PCP will f/u with him regarding results when they are available . Encouraged patient to reach out to PCP and CCM team as needed  Patient Self Care Activities:  . Patient verbalizes understanding of plan to assess headache . Performs ADL's independently . Unable to independently drive or read  Please see past updates related to this goal by clicking on the "Past Updates" button in the selected goal          Plan:   The care management team will reach out to the patient again over the next 7 days.    [name], BSN, RN-BC Embedded Chronic Care Manager [facility] / [place] [facility] Direct Dial: [phone]

## 2019-08-07 ENCOUNTER — Other Ambulatory Visit: Payer: Self-pay

## 2019-08-07 ENCOUNTER — Telehealth: Payer: Self-pay | Admitting: Family Medicine

## 2019-08-07 ENCOUNTER — Ambulatory Visit (HOSPITAL_COMMUNITY)
Admission: RE | Admit: 2019-08-07 | Discharge: 2019-08-07 | Disposition: A | Discharge status: Home / Self Care | Payer: Medicare Other | Source: Ambulatory Visit | Attending: Family Medicine | Admitting: Family Medicine

## 2019-08-07 ENCOUNTER — Other Ambulatory Visit: Payer: Self-pay | Admitting: Family Medicine

## 2019-08-07 DIAGNOSIS — R519 Headache, unspecified: Secondary | ICD-10-CM | POA: Diagnosis not present

## 2019-08-07 DIAGNOSIS — G4452 New daily persistent headache (NDPH): Secondary | ICD-10-CM | POA: Insufficient documentation

## 2019-08-07 NOTE — Chronic Care Management (AMB) (Signed)
  Care Management   Note  08/07/2019 Name: [name] MRN: [id number] DOB: 05-28-1955  [name] is a 64 y.o. year old male who is a primary care patient of [name], [name], MD and is actively engaged with the care management team. I reached out to [name] by phone today to assist with re-scheduling a follow up visit with the Pharmacist Licensed Clinical Social Worker.  Follow up plan: Unsuccessful telephone outreach attempt made. A HIPPA compliant phone message was left for the patient providing contact information and requesting a return call. The care management team will reach out to the patient again over the next 7 days.  If patient returns call to provider office, please advise to call [place] at [phone].   [place], [place] [address] Direct Dial: [phone] [name]: [email or web address]

## 2019-08-07 NOTE — Progress Notes (Signed)
Pt refused MRI contrast. MRI without contrast was completed. Education was given on importance of contrast administration, Pt still refused.

## 2019-08-08 NOTE — Chronic Care Management (AMB) (Signed)
  Care Management   Note  08/08/2019 Name: ERIQ BULLOUGH MRN: [id number] DOB: 09-Jan-1956  MARCOS EDWARDSON is a 64 y.o. year old male who is a primary care patient of [name], [name], MD and is actively engaged with the care management team. I reached out to [name] by phone today to assist with re-scheduling a follow up visit with the Licensed Clinical Social Worker  Follow up plan: Telephone appointment with care management team member scheduled for:08/23/2019.  [place], [place] [address] Direct Dial: [phone] [email or web address]: [email or web address]

## 2019-08-10 ENCOUNTER — Ambulatory Visit: Payer: Self-pay | Admitting: Licensed Clinical Social Worker

## 2019-08-10 ENCOUNTER — Ambulatory Visit: Payer: Medicare Other | Admitting: *Deleted

## 2019-08-10 DIAGNOSIS — I25118 Atherosclerotic heart disease of native coronary artery with other forms of angina pectoris: Secondary | ICD-10-CM

## 2019-08-10 DIAGNOSIS — I252 Old myocardial infarction: Secondary | ICD-10-CM

## 2019-08-10 DIAGNOSIS — I10 Essential (primary) hypertension: Secondary | ICD-10-CM

## 2019-08-10 DIAGNOSIS — E538 Deficiency of other specified B group vitamins: Secondary | ICD-10-CM

## 2019-08-10 DIAGNOSIS — G809 Cerebral palsy, unspecified: Secondary | ICD-10-CM

## 2019-08-10 DIAGNOSIS — G4452 New daily persistent headache (NDPH): Secondary | ICD-10-CM

## 2019-08-10 DIAGNOSIS — F411 Generalized anxiety disorder: Secondary | ICD-10-CM

## 2019-08-10 DIAGNOSIS — J441 Chronic obstructive pulmonary disease with (acute) exacerbation: Secondary | ICD-10-CM

## 2019-08-10 NOTE — Chronic Care Management (AMB) (Signed)
Chronic Care Management   Follow Up Note   08/10/2019 Name: [name] MRN: [id number] DOB: 09-25-1955  Referred by: [name], MD Reason for referral : Chronic Care Management   [name] is a 64 y.o. year old male who is a primary care patient of [name], [name], MD. The CCM team was consulted for assistance with chronic disease management and care coordination needs.    Review of patient status, including review of consultants reports, relevant laboratory and other test results, and collaboration with appropriate care team members and the patient's provider was performed as part of comprehensive patient evaluation and provision of chronic care management services.    I talked with [name] by telephone today regarding his headaches and transportation arrangement to his upcoming appointment.    SDOH (Social Determinants of Health) assessments performed: No See Care Plan activities for detailed interventions related to [facility])     Outpatient Encounter Medications as of 08/10/2019  Medication Sig  . acetaminophen (TYLENOL) 325 MG tablet Take 2 tablets (650 mg total) by mouth every 6 (six) hours as needed for mild pain or headache.  . albuterol (PROAIR HFA) 108 (90 Base) MCG/ACT inhaler 1-2 puffs every 6 hours as needed wheezing or shortness of breath.  [name] aspirin EC 81 MG tablet Take 1 tablet (81 mg total) by mouth daily.  [name] atorvastatin (LIPITOR) 40 MG tablet TAKE 1 TABLET ONCE DAILY AT 6PM  . buPROPion (WELLBUTRIN SR) 150 MG 12 hr tablet Take 1 tablet (150 mg total) by mouth daily with breakfast.  . butalbital-acetaminophen-caffeine (ESGIC) 50-325-40 MG tablet Take 1 tablet by mouth every 6 (six) hours as needed for headache.  . celecoxib (CELEBREX) 200 MG capsule Take 1 capsule (200 mg total) by mouth daily as needed.  . cetaphil (CETAPHIL) lotion Apply 1 application topically 2 (two) times daily. For rash. Apply after washing with [facility] shower gel and rinsing with  clear warm water.  . DULoxetine (CYMBALTA) 60 MG capsule Take 1 capsule (60 mg total) by mouth daily. WITH A FULL STOMACH AT SUPPER TIME  . fexofenadine (ALLEGRA) 180 MG tablet Take 1 tablet (180 mg total) by mouth daily. For allergy symptoms  . fluocinonide-emollient (LIDEX-E) 0.05 % cream Apply 1 application topically 2 (two) times daily. To affected areas  . fluticasone (FLONASE) 50 MCG/ACT nasal spray Place 2 sprays into both nostrils daily as needed for allergies or rhinitis.  . fluticasone furoate-vilanterol (BREO ELLIPTA) 100-25 MCG/INH AEPB Inhale 1 puff into the lungs daily.  . Ipratropium-Albuterol (COMBIVENT RESPIMAT) 20-100 MCG/ACT AERS respimat Inhale 1 puff into the lungs every 6 (six) hours  . isosorbide mononitrate (IMDUR) 60 MG 24 hr tablet Take 1 tablet (60 mg total) by mouth daily.  . metoprolol tartrate (LOPRESSOR) 25 MG tablet Take 1 tablet (25 mg total) by mouth 2 (two) times daily.  . mirtazapine (REMERON) 15 MG tablet Take 1 tablet (15 mg total) by mouth at bedtime. For sleep  . nitroGLYCERIN (NITROSTAT) 0.4 MG SL tablet Place 1 tablet (0.4 mg total) under the tongue every 5 (five) minutes x 3 doses as needed for chest pain.  . pantoprazole (PROTONIX) 40 MG tablet TAKE  (1)  TABLET TWICE A DAY.  [name] predniSONE (STERAPRED UNI-PAK 48 TAB) 10 MG (48) TBPK tablet Use as directed  . tamsulosin (FLOMAX) 0.4 MG CAPS capsule Take 2 capsules (0.8 mg total) by mouth daily after supper.   Facility-Administered Encounter Medications as of 08/10/2019  Medication  . cyanocobalamin ((  VITAMIN B-12)) injection 1,000 mcg     RN Care Plan   . "I want this headache to get better" (pt-stated)       CARE PLAN ENTRY (see longtitudinal plan of care for additional care plan information)  New onset of persistent headache in patient with hypertension, cerebral palsy, and seizure disorder.   Current Barriers:  [name] Knowledge Deficits related to cause and treatment of headache . Literacy  barriers . Transportation  Nurse Case Manager Clinical Goal(s):  [name] Over the next 7 days, patient will f/u with PCP in office regarding headaches  Interventions:  . Chart reviewed, including recent office notes and imaging reports . Discussed with results of MRI with patient by telephone . Discussed current headache symptoms . Reviewed upcoming appointment: Dr Livia Snellen 4/28 . Arranged transportation to appointment. Patient aware.  . Collaborated with [name] . Advised patient to reach out to PCP with any new or worsening symptoms . Advised patient to seek emergency medical care as needed . Encouraged patient to reach out to [facility] team as needed  Patient Self Care Activities:  . Patient verbalizes understanding of plan to assess headache . Performs ADL's independently . Unable to independently drive or read  Please see past updates related to this goal by clicking on the "Past Updates" button in the selected goal      . "I need transportation setup for my appointment"       [place] (see longitudinal plan of care for additional care plan information)  Current Barriers:  . Literacy barriers . Transportation barriers . Cognitive Deficits  Nurse Case Manager Clinical Goal(s):  [name] Over the next 7 days, patient will have transportation arranged for doctor's visit  Interventions:  . Inter-disciplinary care team collaboration (see longitudinal plan of care) . Chart reviewed . Talked with patient by telephone . Discussed upcoming appointment with Dr Livia Snellen on 08/16/19 at 8:55 . Reached out to [name] at [phone] and arranged transport . Patient notified that [name] will pick him up at 8:25 on 08/16/19 . Collaborated with [name]  Patient Self Care Activities:  . Performs ADL's independently . Unable to independently drive  Initial goal documentation         Plan:   The care management team will reach out to the patient again over the next 15 days.    [name], BSN,  RN-BC Embedded Chronic Care Manager [facility] / [place] [facility] Direct Dial: [phone]

## 2019-08-10 NOTE — Chronic Care Management (AMB) (Signed)
Chronic Care Hale    Clinical Social Work Follow Up Note  08/10/2019 Name: [name] MRN: [id number] DOB: 07-19-1955  [name] is a 64 y.o. year old male who is a primary care patient of [name], James Gash, MD. The CCM team was consulted for assistance with [name] .   Review of patient status, including review of consultants reports, other relevant assessments, and collaboration with appropriate care team members and the patient's provider was performed as part of comprehensive patient evaluation and provision of chronic care Hale services.    SDOH (Social Determinants of Health) assessments performed: Yes; risk for tobacco use; risk for stress; risk for depression    Chronic Care Hale from 02/21/2019 in [name]  PHQ-9 Total Score  9     GAD 7 : Generalized Anxiety Score 04/10/2019 05/01/2016 03/31/2016  Nervous, Anxious, on Edge 0 1 3  Control/stop worrying 0 1 3  Worry too much - different things 0 1 3  Trouble relaxing 0 0 2  Restless 0 0 2  Easily annoyed or irritable 0 0 0  Afraid - awful might happen 0 0 0  Total GAD 7 Score 0 3 13  Anxiety Difficulty Not difficult at all Not difficult at all Not difficult at all    Outpatient Encounter Medications as of 08/10/2019  Medication Sig  . acetaminophen (TYLENOL) 325 MG tablet Take 2 tablets (650 mg total) by mouth every 6 (six) hours as needed for mild pain or headache.  . albuterol (PROAIR HFA) 108 (90 Base) MCG/ACT inhaler 1-2 puffs every 6 hours as needed wheezing or shortness of breath.  [name] aspirin EC 81 MG tablet Take 1 tablet (81 mg total) by mouth daily.  [name] atorvastatin (LIPITOR) 40 MG tablet TAKE 1 TABLET ONCE DAILY AT 6PM  . buPROPion (WELLBUTRIN SR) 150 MG 12 hr tablet Take 1 tablet (150 mg total) by mouth daily with breakfast.  . butalbital-acetaminophen-caffeine (ESGIC) 50-325-40 MG tablet Take 1 tablet by mouth every 6 (six) hours as needed for headache.  .  celecoxib (CELEBREX) 200 MG capsule Take 1 capsule (200 mg total) by mouth daily as needed.  . cetaphil (CETAPHIL) lotion Apply 1 application topically 2 (two) times daily. For rash. Apply after washing with [name] shower gel and rinsing with clear warm water.  . DULoxetine (CYMBALTA) 60 MG capsule Take 1 capsule (60 mg total) by mouth daily. WITH A FULL STOMACH AT SUPPER TIME  . fexofenadine (ALLEGRA) 180 MG tablet Take 1 tablet (180 mg total) by mouth daily. For allergy symptoms  . fluocinonide-emollient (LIDEX-E) 0.05 % cream Apply 1 application topically 2 (two) times daily. To affected areas  . fluticasone (FLONASE) 50 MCG/ACT nasal spray Place 2 sprays into both nostrils daily as needed for allergies or rhinitis.  . fluticasone furoate-vilanterol (BREO ELLIPTA) 100-25 MCG/INH AEPB Inhale 1 puff into the lungs daily.  . Ipratropium-Albuterol (COMBIVENT RESPIMAT) 20-100 MCG/ACT AERS respimat Inhale 1 puff into the lungs every 6 (six) hours  . isosorbide mononitrate (IMDUR) 60 MG 24 hr tablet Take 1 tablet (60 mg total) by mouth daily.  . metoprolol tartrate (LOPRESSOR) 25 MG tablet Take 1 tablet (25 mg total) by mouth 2 (two) times daily.  . mirtazapine (REMERON) 15 MG tablet Take 1 tablet (15 mg total) by mouth at bedtime. For sleep  . nitroGLYCERIN (NITROSTAT) 0.4 MG SL tablet Place 1 tablet (0.4 mg total) under the tongue every 5 (five) minutes x 3 doses as  needed for chest pain.  . pantoprazole (PROTONIX) 40 MG tablet TAKE  (1)  TABLET TWICE A DAY.  [name] predniSONE (STERAPRED UNI-PAK 48 TAB) 10 MG (48) TBPK tablet Use as directed  . tamsulosin (FLOMAX) 0.4 MG CAPS capsule Take 2 capsules (0.8 mg total) by mouth daily after supper.   Facility-Administered Encounter Medications as of 08/10/2019  Medication  . cyanocobalamin ((VITAMIN B-12)) injection 1,000 mcg   [name] spoke today via phone with client. Client and [name] spoke of his recent appointment for B12 injection. [name] and client spoke  of his recent MRI test at [facility]. Client said he continues to have periodic headaches and is not sure why he is having headaches. [name] and RNCM have discussed this issue of client. [name] has talked with client about this issue of client headaches. Client is scheduled for follow up appointment next week with [name] to discuss further client needs    Follow Up Plan: [name] to call client as scheduled to assess the social work needs of client at that time  [name].[name] MSW, [name] Licensed Clinical Social Worker [name] [phone]

## 2019-08-10 NOTE — Patient Instructions (Addendum)
Licensed Clinical Social Worker Visit Information  Materials Provided: No  [name] is a 64 y.o. year old male who is a primary care patient of [name], [name], MD. The CCM team was consulted for assistance with [name]  .  Review of patient status, including review of consultants reports, other relevant assessments, and collaboration with appropriate care team members and the patient's provider was performed as part of comprehensive patient evaluation and provision of chronic care management services.   SDOH (Social Determinants of Health) assessments performed: Yes; risk for tobacco use; risk for stress; risk for depression   [name] spoke today via phone with client. Client and [name] spoke of his recent appointment for B12 injection. [name] and client spoke of his recent MRI test at [facility]. Client said he continues to have periodic headaches and is not sure why he is having headaches. [name] and RNCM have discussed this issue of client. Dr. Livia Snellen has talked with client about this issue of client headaches. Client is scheduled for follow up appointment next week with Dr. Livia Snellen to discuss further client needs    Follow Up Plan: [name] to call client as scheduled to assess the social work needs of client at that time  The patient verbalized understanding of instructions provided today and declined a print copy of patient instruction materials.   [name] MSW, [name] Licensed Clinical Social Worker [facility] Care Management [phone]

## 2019-08-11 NOTE — Patient Instructions (Signed)
Visit Information  Goals Addressed            This Visit's Progress     Patient Stated   . "I want this headache to get better" (pt-stated)       CARE PLAN ENTRY (see longtitudinal plan of care for additional care plan information)  New onset of persistent headache in patient with hypertension, cerebral palsy, and seizure disorder.   Current Barriers:  [name] Knowledge Deficits related to cause and treatment of headache . Literacy barriers . Transportation  Nurse Case Manager Clinical Goal(s):  [name] Over the next 7 days, patient will f/u with PCP in office regarding headaches  Interventions:  . Chart reviewed, including recent office notes and imaging reports . Discussed with results of MRI with patient by telephone . Discussed current headache symptoms . Reviewed upcoming appointment: Dr Livia Snellen 4/28 . Arranged transportation to appointment. Patient aware.  . Collaborated with [name] . Advised patient to reach out to PCP with any new or worsening symptoms . Advised patient to seek emergency medical care as needed . Encouraged patient to reach out to [facility] team as needed  Patient Self Care Activities:  . Patient verbalizes understanding of plan to assess headache . Performs ADL's independently . Unable to independently drive or read  Please see past updates related to this goal by clicking on the "Past Updates" button in the selected goal        Other   . "I need transportation setup for my appointment"       [place] (see longitudinal plan of care for additional care plan information)  Current Barriers:  . Literacy barriers . Transportation barriers . Cognitive Deficits  Nurse Case Manager Clinical Goal(s):  [name] Over the next 7 days, patient will have transportation arranged for doctor's visit  Interventions:  . Inter-disciplinary care team collaboration (see longitudinal plan of care) . Chart reviewed . Talked with patient by telephone . Discussed upcoming  appointment with Dr Livia Snellen on 08/16/19 at 8:55 . Reached out to [name] at [phone] and arranged transport . Patient notified that [name] will pick him up at 8:25 on 08/16/19 . Collaborated with [name]  Patient Self Care Activities:  . Performs ADL's independently . Unable to independently drive  Initial goal documentation        The patient verbalized understanding of instructions provided today and declined a print copy of patient instruction materials.   Follow-up Plan The care management team will reach out to the patient again over the next 15 days.   [name], BSN, RN-BC Embedded Chronic Care Manager [facility] / [place] [facility] Direct Dial: [phone]

## 2019-08-14 ENCOUNTER — Ambulatory Visit: Payer: Medicare Other | Admitting: Family Medicine

## 2019-08-16 ENCOUNTER — Encounter: Payer: Self-pay | Admitting: Family Medicine

## 2019-08-16 ENCOUNTER — Ambulatory Visit (INDEPENDENT_AMBULATORY_CARE_PROVIDER_SITE_OTHER): Payer: Medicare Other | Admitting: Family Medicine

## 2019-08-16 ENCOUNTER — Ambulatory Visit: Payer: Self-pay | Admitting: Licensed Clinical Social Worker

## 2019-08-16 ENCOUNTER — Other Ambulatory Visit: Payer: Self-pay

## 2019-08-16 VITALS — BP 135/80 | HR 95 | Temp 97.5°F | Resp 20 | Ht 73.0 in | Wt 241.2 lb

## 2019-08-16 DIAGNOSIS — I25118 Atherosclerotic heart disease of native coronary artery with other forms of angina pectoris: Secondary | ICD-10-CM | POA: Diagnosis not present

## 2019-08-16 DIAGNOSIS — M51369 Other intervertebral disc degeneration, lumbar region without mention of lumbar back pain or lower extremity pain: Secondary | ICD-10-CM

## 2019-08-16 DIAGNOSIS — I252 Old myocardial infarction: Secondary | ICD-10-CM | POA: Diagnosis not present

## 2019-08-16 DIAGNOSIS — I10 Essential (primary) hypertension: Secondary | ICD-10-CM

## 2019-08-16 DIAGNOSIS — J441 Chronic obstructive pulmonary disease with (acute) exacerbation: Secondary | ICD-10-CM

## 2019-08-16 DIAGNOSIS — F411 Generalized anxiety disorder: Secondary | ICD-10-CM

## 2019-08-16 DIAGNOSIS — G809 Cerebral palsy, unspecified: Secondary | ICD-10-CM

## 2019-08-16 DIAGNOSIS — K219 Gastro-esophageal reflux disease without esophagitis: Secondary | ICD-10-CM

## 2019-08-16 DIAGNOSIS — G4489 Other headache syndrome: Secondary | ICD-10-CM

## 2019-08-16 DIAGNOSIS — M5136 Other intervertebral disc degeneration, lumbar region: Secondary | ICD-10-CM

## 2019-08-16 DIAGNOSIS — R482 Apraxia: Secondary | ICD-10-CM

## 2019-08-16 DIAGNOSIS — E538 Deficiency of other specified B group vitamins: Secondary | ICD-10-CM

## 2019-08-16 DIAGNOSIS — F329 Major depressive disorder, single episode, unspecified: Secondary | ICD-10-CM

## 2019-08-16 NOTE — Chronic Care Management (AMB) (Signed)
Chronic Care Management    Clinical Social Work Follow Up Note  08/16/2019 Name: [name] MRN: [id number] DOB: Apr 04, 1956  [name] is a 64 y.o. year old male who is a primary care patient of [name], [name], MD. The CCM team was consulted for assistance with [name] .   Review of patient status, including review of consultants reports, other relevant assessments, and collaboration with appropriate care team members and the patient's provider was performed as part of comprehensive patient evaluation and provision of chronic care management services.    SDOH (Social Determinants of Health) assessments performed: Yes;risk of tobacco use; risk of depression;  risk of stress; risk of transport needs     Chronic Care Management from 02/21/2019 in [place]  PHQ-9 Total Score  9     GAD 7 : Generalized Anxiety Score 04/10/2019 05/01/2016 03/31/2016  Nervous, Anxious, on Edge 0 1 3  Control/stop worrying 0 1 3  Worry too much - different things 0 1 3  Trouble relaxing 0 0 2  Restless 0 0 2  Easily annoyed or irritable 0 0 0  Afraid - awful might happen 0 0 0  Total GAD 7 Score 0 3 13  Anxiety Difficulty Not difficult at all Not difficult at all Not difficult at all    Outpatient Encounter Medications as of 08/16/2019  Medication Sig  . acetaminophen (TYLENOL) 325 MG tablet Take 2 tablets (650 mg total) by mouth every 6 (six) hours as needed for mild pain or headache.  . albuterol (PROAIR HFA) 108 (90 Base) MCG/ACT inhaler 1-2 puffs every 6 hours as needed wheezing or shortness of breath.  [name] aspirin EC 81 MG tablet Take 1 tablet (81 mg total) by mouth daily.  [name] atorvastatin (LIPITOR) 40 MG tablet TAKE 1 TABLET ONCE DAILY AT 6PM  . buPROPion (WELLBUTRIN SR) 150 MG 12 hr tablet Take 1 tablet (150 mg total) by mouth daily with breakfast.  . butalbital-acetaminophen-caffeine (ESGIC) 50-325-40 MG tablet Take 1 tablet by mouth every 6 (six) hours as  needed for headache.  . celecoxib (CELEBREX) 200 MG capsule Take 1 capsule (200 mg total) by mouth daily as needed.  . cetaphil (CETAPHIL) lotion Apply 1 application topically 2 (two) times daily. For rash. Apply after washing with [facility] shower gel and rinsing with clear warm water.  . DULoxetine (CYMBALTA) 60 MG capsule Take 1 capsule (60 mg total) by mouth daily. WITH A FULL STOMACH AT SUPPER TIME  . fexofenadine (ALLEGRA) 180 MG tablet Take 1 tablet (180 mg total) by mouth daily. For allergy symptoms  . fluocinonide-emollient (LIDEX-E) 0.05 % cream Apply 1 application topically 2 (two) times daily. To affected areas  . fluticasone (FLONASE) 50 MCG/ACT nasal spray Place 2 sprays into both nostrils daily as needed for allergies or rhinitis.  . fluticasone furoate-vilanterol (BREO ELLIPTA) 100-25 MCG/INH AEPB Inhale 1 puff into the lungs daily.  . Ipratropium-Albuterol (COMBIVENT RESPIMAT) 20-100 MCG/ACT AERS respimat Inhale 1 puff into the lungs every 6 (six) hours  . isosorbide mononitrate (IMDUR) 60 MG 24 hr tablet Take 1 tablet (60 mg total) by mouth daily.  . metoprolol tartrate (LOPRESSOR) 25 MG tablet Take 1 tablet (25 mg total) by mouth 2 (two) times daily.  . mirtazapine (REMERON) 15 MG tablet Take 1 tablet (15 mg total) by mouth at bedtime. For sleep  . nitroGLYCERIN (NITROSTAT) 0.4 MG SL tablet Place 1 tablet (0.4 mg total) under the tongue every 5 (five)  minutes x 3 doses as needed for chest pain.  . pantoprazole (PROTONIX) 40 MG tablet TAKE  (1)  TABLET TWICE A DAY.  . tamsulosin (FLOMAX) 0.4 MG CAPS capsule Take 2 capsules (0.8 mg total) by mouth daily after supper.   Facility-Administered Encounter Medications as of 08/16/2019  Medication  . cyanocobalamin ((VITAMIN B-12)) injection 1,000 mcg    Goals     . Improve Insufficient Self Care Secondary to Grief (pt-stated)         Current Barriers:   Grief reaction to sister's death in patient with Chronic Diagnoses of  COPD, Apaxia of Speech, CAD, HTN, GAD, DDD, Depression  depression  Nurse Case Manager Clinical Goal(s):   Over the next 30 days, patient will continue to talk with [name] regarding grief secondary to sister's death  Over the next 30 days, patient will work with RN to improve oral intake in presence of decreased appetite secondary to grief process  Interventions:   LCSWtalked with client about grief issues related to death of his older sister Talked with client about pain issues of client Talked with client about his appointment with Dr. Livia Snellen on 08/16/2019 Encouraged client to talk with [facility] about nursing needs of client  Patient Self Care Activities:   Performs ADL's independently  Unable to perform IADLs independently. Has not have transportation arranged.   Initial goal documentation   Follow Up Plan:[name] to call client in next 4 weeks to talk with client about his management of grief issues by client  [name].Shigeko Manard MSW, [name] Licensed Clinical Social Worker [facility] [phone]

## 2019-08-16 NOTE — Patient Instructions (Addendum)
Licensed Clinical Social Worker Visit Information  Goals we discussed today:    . Improve Insufficient Self Care Secondary to Grief (pt-stated)         Current Barriers:  Grief reaction to sister's death in patient with Chronic Diagnoses of COPD, Apaxia of Speech, CAD, HTN, GAD, DDD, Depression  depression Nurse Case Manager Clinical Goal(s):  Over the next 30 days, patient will continue to talk with [name] regarding grief secondary to sister's death  Over the next 30 days, patient will work with RN to improve oral intake in presence of decreased appetite secondary to grief process Interventions:  [name] talked with client about grief issues related to death of his older sister  Talked with client about pain issues of client  Talked with client about his appointment with Dr. Livia Snellen on 08/16/2019  Encouraged client to talk with [facility] about nursing needs of client  Patient Self Care Activities:  Performs ADL's independently  Unable to perform IADLs independently. Has not have transportation arranged.  Initial goal documentation   Follow Up Plan: [name] to call client in next 4 weeks to talk with client about his management of grief issues by client   Materials Provided: No  The patient verbalized understanding of instructions provided today and declined a print copy of patient instruction materials.   [name].Zarianna Dicarlo MSW, [name] Licensed Clinical Social Worker [facility] [phone]

## 2019-08-20 ENCOUNTER — Encounter: Payer: Self-pay | Admitting: Family Medicine

## 2019-08-20 NOTE — Progress Notes (Signed)
Subjective:  Patient ID: [name], male    DOB: [birth year]  Age: 64 y.o. MRN: [id number]  CC: Medical Management of Chronic Issues   HPI [name] presents for recheck of his headache. HE states it has finally resolved over the last few days.   Depression screen [facility] 2/9 08/16/2019 06/27/2019 05/16/2019  Decreased Interest 0 0 0  Down, Depressed, Hopeless 0 0 0  PHQ - 2 Score 0 0 0  Altered sleeping - - -  Tired, decreased energy - - -  Change in appetite - - -  Feeling bad or failure about yourself  - - -  Trouble concentrating - - -  Moving slowly or fidgety/restless - - -  Suicidal thoughts - - -  PHQ-9 Score - - -  Difficult doing work/chores - - -  Some recent data might be hidden    History [name] has a past medical history of Cerebral palsy ([place]), Coronary artery disease, Depression, GERD (gastroesophageal reflux disease), Hypertension, Scoliosis, and Seizures ([place]).   He has a past surgical history that includes Coronary angioplasty with stent; Carpal tunnel release (Right, 08/13/2016); and LEFT HEART CATH AND CORONARY ANGIOGRAPHY (N/A, 07/10/2017).   His family history includes Alcohol abuse in his brother; CAD in his brother and sister; CAD (age of onset: 56) in his father; Diabetes in his mother; Heart disease in his father; Heart failure in his sister.He reports that he quit smoking about 14 months ago. His smoking use included cigarettes. He has a 20.50 pack-year smoking history. He has never used smokeless tobacco. He reports current alcohol use. He reports that he does not use drugs.    ROS Review of Systems  Constitutional: Negative for fever.  Respiratory: Negative for shortness of breath.   Cardiovascular: Negative for chest pain.  Musculoskeletal: Negative for arthralgias.  Skin: Negative for rash.    Objective:  BP 135/80   Pulse 95   Temp (!) 97.5 F (36.4 C) (Temporal)   Resp 20   Ht 6\' 1"  (1.854 m)   Wt 241 lb 4 oz (109.4 kg)   SpO2 97%    BMI 31.83 kg/m   BP Readings from Last 3 Encounters:  08/16/19 135/80  06/27/19 127/76  05/16/19 135/76    Wt Readings from Last 3 Encounters:  08/16/19 241 lb 4 oz (109.4 kg)  06/27/19 242 lb 12.8 oz (110.1 kg)  05/16/19 246 lb (111.6 kg)     Physical Exam Vitals reviewed.  Constitutional:      Appearance: He is well-developed.  HENT:     Head: Normocephalic and atraumatic.     Right Ear: External ear normal.     Left Ear: External ear normal.     Mouth/Throat:     Pharynx: No oropharyngeal exudate or posterior oropharyngeal erythema.  Eyes:     Pupils: Pupils are equal, round, and reactive to light.  Cardiovascular:     Rate and Rhythm: Normal rate and regular rhythm.     Heart sounds: No murmur.  Pulmonary:     Effort: No respiratory distress.     Breath sounds: Normal breath sounds.  Musculoskeletal:     Cervical back: Normal range of motion and neck supple.  Neurological:     Mental Status: He is alert and oriented to person, place, and time.       Assessment & Plan:   [name] was seen today for medical management of chronic issues.  Diagnoses and all orders for this  visit:  Other headache syndrome  Essential hypertension  Infantile cerebral palsy ([place])       I have discontinued Christian Mate. Gillihan's predniSONE. I am also having him maintain his acetaminophen, fluocinonide-emollient, cetaphil, albuterol, fexofenadine, fluticasone, aspirin EC, atorvastatin, buPROPion, celecoxib, Combivent Respimat, DULoxetine, Breo Ellipta, metoprolol tartrate, mirtazapine, nitroGLYCERIN, pantoprazole, tamsulosin, isosorbide mononitrate, and butalbital-acetaminophen-caffeine. We will continue to administer cyanocobalamin.  Allergies as of 08/16/2019      Reactions   Chantix [varenicline Tartrate] Nausea And Vomiting      Medication List       Accurate as of August 16, 2019 11:59 PM. If you have any questions, ask your nurse or doctor.        STOP taking these  medications   predniSONE 10 MG (48) Tbpk tablet Commonly known as: STERAPRED UNI-PAK 48 TAB Stopped by: [name], MD     TAKE these medications   acetaminophen 325 MG tablet Commonly known as: TYLENOL Take 2 tablets (650 mg total) by mouth every 6 (six) hours as needed for mild pain or headache.   albuterol 108 (90 Base) MCG/ACT inhaler Commonly known as: ProAir HFA 1-2 puffs every 6 hours as needed wheezing or shortness of breath.   aspirin EC 81 MG tablet Take 1 tablet (81 mg total) by mouth daily.   atorvastatin 40 MG tablet Commonly known as: LIPITOR TAKE 1 TABLET ONCE DAILY AT 6PM   Breo Ellipta 100-25 MCG/INH Aepb Generic drug: fluticasone furoate-vilanterol Inhale 1 puff into the lungs daily.   buPROPion 150 MG 12 hr tablet Commonly known as: WELLBUTRIN SR Take 1 tablet (150 mg total) by mouth daily with breakfast.   butalbital-acetaminophen-caffeine 50-325-40 MG tablet Commonly known as: Esgic Take 1 tablet by mouth every 6 (six) hours as needed for headache.   celecoxib 200 MG capsule Commonly known as: CELEBREX Take 1 capsule (200 mg total) by mouth daily as needed.   cetaphil lotion Apply 1 application topically 2 (two) times daily. For rash. Apply after washing with [facility] shower gel and rinsing with clear warm water.   Combivent Respimat 20-100 MCG/ACT Aers respimat Generic drug: Ipratropium-Albuterol Inhale 1 puff into the lungs every 6 (six) hours   DULoxetine 60 MG capsule Commonly known as: CYMBALTA Take 1 capsule (60 mg total) by mouth daily. WITH A FULL STOMACH AT SUPPER TIME   fexofenadine 180 MG tablet Commonly known as: ALLEGRA Take 1 tablet (180 mg total) by mouth daily. For allergy symptoms   fluocinonide-emollient 0.05 % cream Commonly known as: LIDEX-E Apply 1 application topically 2 (two) times daily. To affected areas   fluticasone 50 MCG/ACT nasal spray Commonly known as: FLONASE Place 2 sprays into both nostrils daily  as needed for allergies or rhinitis.   isosorbide mononitrate 60 MG 24 hr tablet Commonly known as: IMDUR Take 1 tablet (60 mg total) by mouth daily.   metoprolol tartrate 25 MG tablet Commonly known as: LOPRESSOR Take 1 tablet (25 mg total) by mouth 2 (two) times daily.   mirtazapine 15 MG tablet Commonly known as: Remeron Take 1 tablet (15 mg total) by mouth at bedtime. For sleep   nitroGLYCERIN 0.4 MG SL tablet Commonly known as: NITROSTAT Place 1 tablet (0.4 mg total) under the tongue every 5 (five) minutes x 3 doses as needed for chest pain.   pantoprazole 40 MG tablet Commonly known as: PROTONIX TAKE  (1)  TABLET TWICE A DAY.   tamsulosin 0.4 MG Caps capsule Commonly known as: FLOMAX Take 2  capsules (0.8 mg total) by mouth daily after supper.        Follow-up: Return in about 3 months (around 11/15/2019), or if symptoms worsen or fail to improve.  [name], M.D.

## 2019-08-21 ENCOUNTER — Other Ambulatory Visit (HOSPITAL_COMMUNITY): Payer: Medicare Other

## 2019-08-21 ENCOUNTER — Ambulatory Visit: Payer: Self-pay | Admitting: Licensed Clinical Social Worker

## 2019-08-21 DIAGNOSIS — M5136 Other intervertebral disc degeneration, lumbar region: Secondary | ICD-10-CM

## 2019-08-21 DIAGNOSIS — I10 Essential (primary) hypertension: Secondary | ICD-10-CM

## 2019-08-21 DIAGNOSIS — G809 Cerebral palsy, unspecified: Secondary | ICD-10-CM

## 2019-08-21 DIAGNOSIS — M51369 Other intervertebral disc degeneration, lumbar region without mention of lumbar back pain or lower extremity pain: Secondary | ICD-10-CM

## 2019-08-21 DIAGNOSIS — R482 Apraxia: Secondary | ICD-10-CM

## 2019-08-21 DIAGNOSIS — I25118 Atherosclerotic heart disease of native coronary artery with other forms of angina pectoris: Secondary | ICD-10-CM

## 2019-08-21 DIAGNOSIS — F411 Generalized anxiety disorder: Secondary | ICD-10-CM

## 2019-08-21 DIAGNOSIS — I252 Old myocardial infarction: Secondary | ICD-10-CM

## 2019-08-21 DIAGNOSIS — F329 Major depressive disorder, single episode, unspecified: Secondary | ICD-10-CM

## 2019-08-21 DIAGNOSIS — F32A Depression, unspecified: Secondary | ICD-10-CM

## 2019-08-21 NOTE — Chronic Care Management (AMB) (Signed)
Chronic Care Management    Clinical Social Work Follow Up Note  08/21/2019 Name: [name] MRN: [id number] DOB: 09-27-1955  [name] is a 64 y.o. year old male who is a primary care patient of [name], [name], MD. The CCM team was consulted for assistance with [name] .   Review of patient status, including review of consultants reports, other relevant assessments, and collaboration with appropriate care team members and the patient's provider was performed as part of comprehensive patient evaluation and provision of chronic care management services.    SDOH (Social Determinants of Health) assessments performed: Yes;risk of tobacco use; risk of depression risk of stress; risk of transport needs    Chronic Care Management from 02/21/2019 in [place]  PHQ-9 Total Score  9     GAD 7 : Generalized Anxiety Score 04/10/2019 05/01/2016 03/31/2016  Nervous, Anxious, on Edge 0 1 3  Control/stop worrying 0 1 3  Worry too much - different things 0 1 3  Trouble relaxing 0 0 2  Restless 0 0 2  Easily annoyed or irritable 0 0 0  Afraid - awful might happen 0 0 0  Total GAD 7 Score 0 3 13  Anxiety Difficulty Not difficult at all Not difficult at all Not difficult at all    Outpatient Encounter Medications as of 08/21/2019  Medication Sig  . acetaminophen (TYLENOL) 325 MG tablet Take 2 tablets (650 mg total) by mouth every 6 (six) hours as needed for mild pain or headache.  . albuterol (PROAIR HFA) 108 (90 Base) MCG/ACT inhaler 1-2 puffs every 6 hours as needed wheezing or shortness of breath.  [name] aspirin EC 81 MG tablet Take 1 tablet (81 mg total) by mouth daily.  [name] atorvastatin (LIPITOR) 40 MG tablet TAKE 1 TABLET ONCE DAILY AT 6PM  . buPROPion (WELLBUTRIN SR) 150 MG 12 hr tablet Take 1 tablet (150 mg total) by mouth daily with breakfast.  . butalbital-acetaminophen-caffeine (ESGIC) 50-325-40 MG tablet Take 1 tablet by mouth every 6 (six) hours as needed  for headache.  . celecoxib (CELEBREX) 200 MG capsule Take 1 capsule (200 mg total) by mouth daily as needed.  . cetaphil (CETAPHIL) lotion Apply 1 application topically 2 (two) times daily. For rash. Apply after washing with [facility] shower gel and rinsing with clear warm water.  . DULoxetine (CYMBALTA) 60 MG capsule Take 1 capsule (60 mg total) by mouth daily. WITH A FULL STOMACH AT SUPPER TIME  . fexofenadine (ALLEGRA) 180 MG tablet Take 1 tablet (180 mg total) by mouth daily. For allergy symptoms  . fluocinonide-emollient (LIDEX-E) 0.05 % cream Apply 1 application topically 2 (two) times daily. To affected areas  . fluticasone (FLONASE) 50 MCG/ACT nasal spray Place 2 sprays into both nostrils daily as needed for allergies or rhinitis.  . fluticasone furoate-vilanterol (BREO ELLIPTA) 100-25 MCG/INH AEPB Inhale 1 puff into the lungs daily.  . Ipratropium-Albuterol (COMBIVENT RESPIMAT) 20-100 MCG/ACT AERS respimat Inhale 1 puff into the lungs every 6 (six) hours  . isosorbide mononitrate (IMDUR) 60 MG 24 hr tablet Take 1 tablet (60 mg total) by mouth daily.  . metoprolol tartrate (LOPRESSOR) 25 MG tablet Take 1 tablet (25 mg total) by mouth 2 (two) times daily.  . mirtazapine (REMERON) 15 MG tablet Take 1 tablet (15 mg total) by mouth at bedtime. For sleep  . nitroGLYCERIN (NITROSTAT) 0.4 MG SL tablet Place 1 tablet (0.4 mg total) under the tongue every 5 (five) minutes x  3 doses as needed for chest pain.  . pantoprazole (PROTONIX) 40 MG tablet TAKE  (1)  TABLET TWICE A DAY.  . tamsulosin (FLOMAX) 0.4 MG CAPS capsule Take 2 capsules (0.8 mg total) by mouth daily after supper.   Facility-Administered Encounter Medications as of 08/21/2019  Medication  . cyanocobalamin ((VITAMIN B-12)) injection 1,000 mcg    Goals    . "I need transportation to come in and get my B12 shot" (pt-stated)     Transportation needs in patient with infantile cerebral palsy, hypertension, and hx of MI  Current  Barriers:  . Film/video editor.  . Literacy barriers . Transportation barriers in patient with Chronic Diagnoses of HTN, GAD, Hx of MI, CAD, Apraxia of Speech, DDD, Depression  Nurse Case Manager Clinical Goal(s):  [name] Over the next 7 days  patient will work with [name] to arrange transport for client to and from client's [facility] appointment on 08/31/2019 at 10:00 AM for B 12 injection for client   Interventions:  . Reviewed appointment information with patient . [name] communicated with [name] representative on 08/21/2019 to try to set up transport arrangements for client's 08/31/2019 appointment at [facility]   . Talked with client about his current social work needs.  . Talked with client about pain issues of client . Talked with [name] about food provision for client . Talked with client about home health support for client  Patient Self Care Activities:  . Performs most ADLs but does have in home Aide and has help form his sisters for most all IADLs   Please see past updates related to this goal by clicking on the "Past Updates" button in the selected goal         Follow Up Plan: [name] to call client as scheduled to assess client needs at that time  [place] Fadeley.Burma Ketcher MSW, [name] Licensed Clinical Social Worker [facility] [phone]

## 2019-08-21 NOTE — Patient Instructions (Addendum)
Licensed Clinical Social Worker Visit Information  Goals we discussed today:            . "I need transportation to come in and get my B12 shot" (pt-stated)      Transportation needs in patient with infantile cerebral palsy, hypertension, and hx of MI  Current Barriers:  Film/video editor.  Literacy barriers  Transportation barriers in patient with Chronic Diagnoses of HTN, GAD, Hx of MI, CAD, Apraxia of Speech, DDD, Depression Nurse Case Manager Clinical Goal(s):  Over the next 7 days patient will work with [name] to arrange transport for client to and from client's [facility] appointment on 08/31/2019 at 10:00 AM for B 12 injection for client  Interventions:  Reviewed appointment information with patient  [name] communicated with [name] representative on 08/21/2019 to try to set up transport arrangements for client's 08/31/2019 appointment at [facility]  Talked with client about his current social work needs.  Talked with client about pain issues of client  Talked with E[place] about food provision for client  Talked with client about home health support for client  Patient Self Care Activities:  Performs most ADLs but does have in home Aide and has help form his sisters for most all IADLs   Materials Provided: No   Follow Up Plan: [name] to call client as scheduled to assess client needs at that time  The patient verbalized understanding of instructions provided today and declined a print copy of patient instruction materials.   [name].Kavan Devan MSW, [name] Licensed Clinical Social Worker [facility] [phone]

## 2019-08-22 ENCOUNTER — Telehealth: Payer: Medicare Other

## 2019-08-23 ENCOUNTER — Ambulatory Visit (INDEPENDENT_AMBULATORY_CARE_PROVIDER_SITE_OTHER): Payer: Medicare Other | Admitting: Licensed Clinical Social Worker

## 2019-08-23 ENCOUNTER — Ambulatory Visit (INDEPENDENT_AMBULATORY_CARE_PROVIDER_SITE_OTHER): Payer: Medicare Other

## 2019-08-23 DIAGNOSIS — F329 Major depressive disorder, single episode, unspecified: Secondary | ICD-10-CM

## 2019-08-23 DIAGNOSIS — Z0000 Encounter for general adult medical examination without abnormal findings: Secondary | ICD-10-CM | POA: Diagnosis not present

## 2019-08-23 DIAGNOSIS — K219 Gastro-esophageal reflux disease without esophagitis: Secondary | ICD-10-CM

## 2019-08-23 DIAGNOSIS — F32A Depression, unspecified: Secondary | ICD-10-CM

## 2019-08-23 DIAGNOSIS — I10 Essential (primary) hypertension: Secondary | ICD-10-CM

## 2019-08-23 DIAGNOSIS — I252 Old myocardial infarction: Secondary | ICD-10-CM | POA: Diagnosis not present

## 2019-08-23 DIAGNOSIS — J441 Chronic obstructive pulmonary disease with (acute) exacerbation: Secondary | ICD-10-CM | POA: Diagnosis not present

## 2019-08-23 DIAGNOSIS — I25118 Atherosclerotic heart disease of native coronary artery with other forms of angina pectoris: Secondary | ICD-10-CM | POA: Diagnosis not present

## 2019-08-23 DIAGNOSIS — F411 Generalized anxiety disorder: Secondary | ICD-10-CM

## 2019-08-23 DIAGNOSIS — M5136 Other intervertebral disc degeneration, lumbar region: Secondary | ICD-10-CM

## 2019-08-23 DIAGNOSIS — R482 Apraxia: Secondary | ICD-10-CM

## 2019-08-23 DIAGNOSIS — E538 Deficiency of other specified B group vitamins: Secondary | ICD-10-CM

## 2019-08-23 DIAGNOSIS — G809 Cerebral palsy, unspecified: Secondary | ICD-10-CM

## 2019-08-23 NOTE — Chronic Care Management (AMB) (Signed)
Chronic Care Management    Clinical Social Work Follow Up Note  08/23/2019 Name: [name] MRN: [id number] DOB: 17-Nov-1955  [name] is a 64 y.o. year old male who is a primary care patient of [name], [name], MD. The CCM team was consulted for assistance with [name] .   Review of patient status, including review of consultants reports, other relevant assessments, and collaboration with appropriate care team members and the patient's provider was performed as part of comprehensive patient evaluation and provision of chronic care management services.    SDOH (Social Determinants of Health) assessments performed: Yes; risk for tobacco use; risk for depression; risk for transport needs    Chronic Care Management from 02/21/2019 in [place]  PHQ-9 Total Score  9     GAD 7 : Generalized Anxiety Score 04/10/2019 05/01/2016 03/31/2016  Nervous, Anxious, on Edge 0 1 3  Control/stop worrying 0 1 3  Worry too much - different things 0 1 3  Trouble relaxing 0 0 2  Restless 0 0 2  Easily annoyed or irritable 0 0 0  Afraid - awful might happen 0 0 0  Total GAD 7 Score 0 3 13  Anxiety Difficulty Not difficult at all Not difficult at all Not difficult at all    Outpatient Encounter Medications as of 08/23/2019  Medication Sig  . acetaminophen (TYLENOL) 325 MG tablet Take 2 tablets (650 mg total) by mouth every 6 (six) hours as needed for mild pain or headache.  . albuterol (PROAIR HFA) 108 (90 Base) MCG/ACT inhaler 1-2 puffs every 6 hours as needed wheezing or shortness of breath.  [name] aspirin EC 81 MG tablet Take 1 tablet (81 mg total) by mouth daily.  [name] atorvastatin (LIPITOR) 40 MG tablet TAKE 1 TABLET ONCE DAILY AT 6PM  . buPROPion (WELLBUTRIN SR) 150 MG 12 hr tablet Take 1 tablet (150 mg total) by mouth daily with breakfast.  . butalbital-acetaminophen-caffeine (ESGIC) 50-325-40 MG tablet Take 1 tablet by mouth every 6 (six) hours as needed for  headache.  . celecoxib (CELEBREX) 200 MG capsule Take 1 capsule (200 mg total) by mouth daily as needed.  . cetaphil (CETAPHIL) lotion Apply 1 application topically 2 (two) times daily. For rash. Apply after washing with [facility] shower gel and rinsing with clear warm water.  . DULoxetine (CYMBALTA) 60 MG capsule Take 1 capsule (60 mg total) by mouth daily. WITH A FULL STOMACH AT SUPPER TIME  . fexofenadine (ALLEGRA) 180 MG tablet Take 1 tablet (180 mg total) by mouth daily. For allergy symptoms  . fluocinonide-emollient (LIDEX-E) 0.05 % cream Apply 1 application topically 2 (two) times daily. To affected areas  . fluticasone (FLONASE) 50 MCG/ACT nasal spray Place 2 sprays into both nostrils daily as needed for allergies or rhinitis.  . fluticasone furoate-vilanterol (BREO ELLIPTA) 100-25 MCG/INH AEPB Inhale 1 puff into the lungs daily.  . Ipratropium-Albuterol (COMBIVENT RESPIMAT) 20-100 MCG/ACT AERS respimat Inhale 1 puff into the lungs every 6 (six) hours  . isosorbide mononitrate (IMDUR) 60 MG 24 hr tablet Take 1 tablet (60 mg total) by mouth daily.  . metoprolol tartrate (LOPRESSOR) 25 MG tablet Take 1 tablet (25 mg total) by mouth 2 (two) times daily.  . mirtazapine (REMERON) 15 MG tablet Take 1 tablet (15 mg total) by mouth at bedtime. For sleep  . nitroGLYCERIN (NITROSTAT) 0.4 MG SL tablet Place 1 tablet (0.4 mg total) under the tongue every 5 (five) minutes x 3 doses  as needed for chest pain.  . pantoprazole (PROTONIX) 40 MG tablet TAKE  (1)  TABLET TWICE A DAY.  . tamsulosin (FLOMAX) 0.4 MG CAPS capsule Take 2 capsules (0.8 mg total) by mouth daily after supper.   Facility-Administered Encounter Medications as of 08/23/2019  Medication  . cyanocobalamin ((VITAMIN B-12)) injection 1,000 mcg    Goals Addressed            This Visit's Progress   . COMPLETED: "I need a ride to get my B12 shot" (pt-stated)       Current Barriers:  . Film/video editor.  . Literacy  barriers . Transportation barriers in client with Chronic Diagnoses of DDD, Depressin Apraxia of Speech, CAD GAD, Hx MI, HTN, Infantile Cerebral Palsy  . Cognitive Deficits  Nurse Case Manager Clinical Goal(s):  [name] Over the next 7 days, patient will have transportation arranged for B12 injection that is scheduled for 08/31/2019 at [facility] .  Interventions:  . [name] had phone conversation with [name] representative regarding client transport arrangement for 08/31/2019 B12 injection appointment for client at [facility] . [name] informed client of [name] transport arrangements for client for 08/31/2019 client appointment at [facility] . Talked with client about client's social work needs .  Talked with client about headache issues of client . Talked with client about social support network (has support from his younger sister) . Talked with client about client decreased energy . Collaborated with RNCM about nursing needs of client  Patient Self Care Activities:  . Currently UNABLE TO independently drive to medical appointments.   Please see past updates related to this goal by clicking on the "Past Updates" button in the selected goal        Follow Up Plan: [name] to call client in next 4 weeks to talk with client about social work needs of client at that time  Ludy Litherland.Clell Trahan MSW, [name] Licensed Clinical Social Worker [facility] Care Management [phone]

## 2019-08-23 NOTE — Progress Notes (Signed)
MEDICARE ANNUAL WELLNESS VISIT  08/23/2019  Telephone Visit Disclaimer This Medicare AWV was conducted by telephone due to national recommendations for restrictions regarding the COVID-19 Pandemic (e.g. social distancing).  I verified, using two identifiers, that I am speaking with [name] or their authorized healthcare agent. I discussed the limitations, risks, security, and privacy concerns of performing an evaluation and management service by telephone and the potential availability of an in-person appointment in the future. The patient expressed understanding and agreed to proceed.   Subjective:  [name] is a 64 y.o. male patient of [name], [name], MD who had a Medicare Annual Wellness Visit today via telephone. [name] is Disabled and lives alone. He has no children. He reports that he is socially active and does interact with friends/family regularly. He is minimally physically active and enjoys listening to music and spending time with this family.  Patient Care Team: [name], MD as PCP - General (Family Medicine) Minus Breeding, MD as PCP - Cardiology (Cardiology) Shea Evans, [name], [name] as Social Worker (Licensed Clinical Social Worker) [name], RN as Case Manager  Advanced Directives 08/23/2019 08/22/2018 06/27/2018 06/27/2018 03/08/2018 07/23/2017 07/19/2017  Does Patient Have a Medical Advance Directive? No No No No No No No  Type of Advance Directive - - - - - - -  Would patient like information on creating a medical advance directive? No - Patient declined Yes (MAU/Ambulatory/Procedural Areas - Information given) No - Patient declined - No - Patient declined Yes (MAU/Ambulatory/Procedural Areas - Information given) Yes (Inpatient - patient requests chaplain consult to create a medical advance directive)    Hospital Utilization Over the Past 12 Months: # of hospitalizations or ER visits: 1 # of surgeries: 0  Review of Systems    Patient reports that  his overall health is unchanged compared to last year.  History obtained from chart review and the patient  Patient Reported Readings (BP, Pulse, CBG, Weight, etc) none  Pain Assessment Pain : No/denies pain     Current Medications & Allergies (verified) Allergies as of 08/23/2019      Reactions   Chantix [varenicline Tartrate] Nausea And Vomiting      Medication List       Accurate as of Aug 23, 2019 10:29 AM. If you have any questions, ask your nurse or doctor.        acetaminophen 325 MG tablet Commonly known as: TYLENOL Take 2 tablets (650 mg total) by mouth every 6 (six) hours as needed for mild pain or headache.   albuterol 108 (90 Base) MCG/ACT inhaler Commonly known as: ProAir HFA 1-2 puffs every 6 hours as needed wheezing or shortness of breath.   aspirin EC 81 MG tablet Take 1 tablet (81 mg total) by mouth daily.   atorvastatin 40 MG tablet Commonly known as: LIPITOR TAKE 1 TABLET ONCE DAILY AT 6PM   Breo Ellipta 100-25 MCG/INH Aepb Generic drug: fluticasone furoate-vilanterol Inhale 1 puff into the lungs daily.   buPROPion 150 MG 12 hr tablet Commonly known as: WELLBUTRIN SR Take 1 tablet (150 mg total) by mouth daily with breakfast.   butalbital-acetaminophen-caffeine 50-325-40 MG tablet Commonly known as: Esgic Take 1 tablet by mouth every 6 (six) hours as needed for headache.   celecoxib 200 MG capsule Commonly known as: CELEBREX Take 1 capsule (200 mg total) by mouth daily as needed.   cetaphil lotion Apply 1 application topically 2 (two) times daily. For rash. Apply after washing  with [facility] shower gel and rinsing with clear warm water.   Combivent Respimat 20-100 MCG/ACT Aers respimat Generic drug: Ipratropium-Albuterol Inhale 1 puff into the lungs every 6 (six) hours   DULoxetine 60 MG capsule Commonly known as: CYMBALTA Take 1 capsule (60 mg total) by mouth daily. WITH A FULL STOMACH AT SUPPER TIME   fexofenadine 180 MG  tablet Commonly known as: ALLEGRA Take 1 tablet (180 mg total) by mouth daily. For allergy symptoms   fluocinonide-emollient 0.05 % cream Commonly known as: LIDEX-E Apply 1 application topically 2 (two) times daily. To affected areas   fluticasone 50 MCG/ACT nasal spray Commonly known as: FLONASE Place 2 sprays into both nostrils daily as needed for allergies or rhinitis.   isosorbide mononitrate 60 MG 24 hr tablet Commonly known as: IMDUR Take 1 tablet (60 mg total) by mouth daily.   metoprolol tartrate 25 MG tablet Commonly known as: LOPRESSOR Take 1 tablet (25 mg total) by mouth 2 (two) times daily.   mirtazapine 15 MG tablet Commonly known as: Remeron Take 1 tablet (15 mg total) by mouth at bedtime. For sleep   nitroGLYCERIN 0.4 MG SL tablet Commonly known as: NITROSTAT Place 1 tablet (0.4 mg total) under the tongue every 5 (five) minutes x 3 doses as needed for chest pain.   pantoprazole 40 MG tablet Commonly known as: PROTONIX TAKE  (1)  TABLET TWICE A DAY.   tamsulosin 0.4 MG Caps capsule Commonly known as: FLOMAX Take 2 capsules (0.8 mg total) by mouth daily after supper.       History (reviewed): Past Medical History:  Diagnosis Date  . Cerebral palsy ([place])   . Coronary artery disease    LAD 70% stenosis, cath 2019  . Depression   . GERD (gastroesophageal reflux disease)   . Hypertension   . Scoliosis   . Seizures ([place])    pt's sister states that patinet has had seizures in the past, pt camr for PAT by himself on last visit and she stst "they misunderstood what he was trying to say. Pt saw neurologist in March who did CT and states patient does not have seizures. On no meds, had seizures as child.   Past Surgical History:  Procedure Laterality Date  . CARPAL TUNNEL RELEASE Right 08/13/2016   Procedure: CARPAL TUNNEL RELEASE;  Surgeon: Carole Civil, MD;  Location: [facility];  Service: Orthopedics;  Laterality: Right;  . CORONARY ANGIOPLASTY WITH STENT  PLACEMENT    . LEFT HEART CATH AND CORONARY ANGIOGRAPHY N/A 07/10/2017   Procedure: LEFT HEART CATH AND CORONARY ANGIOGRAPHY;  Surgeon: Troy Sine, MD;  Location: [place] [facility];  Service: Cardiovascular;  Laterality: N/A;   Family History  Problem Relation Age of Onset  . CAD Father 74       CABG  . Heart disease Father   . CAD Brother   . Diabetes Mother   . Alcohol abuse Brother   . CAD Sister   . Heart failure Sister    Social History   Socioeconomic History  . Marital status: Single    Spouse name: Not on file  . Number of children: Not on file  . Years of education: 1  . Highest education level: High school graduate  Occupational History  . Occupation: disabled  Tobacco Use  . Smoking status: Former Smoker    Packs/day: 0.50    Years: 41.00    Pack years: 20.50    Types: Cigarettes  Quit date: 06/06/2018    Years since quitting: 1.2  . Smokeless tobacco: Never Used  . Tobacco comment: Patient quit on his own in February 2020  Substance and Sexual Activity  . Alcohol use: Yes    Comment: "once in a blue moon" typically drinks beer 2 weeks or longer  . Drug use: No  . Sexual activity: Never    Birth control/protection: None  Other Topics Concern  . Not on file  Social History Narrative   08/05/2018   Patient lives alone in a trailer. His sister [name] will take take him to the grocery store or bring things by for him. He usually sees her twice a week. His "baby sister" also checks in on him. He is able to perform most ADLs but is illiterate. He does not drive and depends on family or arranged transportation. States that his landlord and his wife are nice and lookout for him.    Social Determinants of Health   Financial Resource Strain:   . Difficulty of Paying Living Expenses:   Food Insecurity:   . Worried About Charity fundraiser in the Last Year:   . Arboriculturist in the Last Year:   Transportation Needs: Unmet Transportation Needs  . Lack of  Transportation (Medical): Yes  . Lack of Transportation (Non-Medical): Yes  Physical Activity:   . Days of Exercise per Week:   . Minutes of Exercise per Session:   Stress: No Stress Concern Present  . Feeling of Stress : Only a little  Social Connections:   . Frequency of Communication with Friends and Family:   . Frequency of Social Gatherings with Friends and Family:   . Attends Religious Services:   . Active Member of Clubs or Organizations:   . Attends Archivist Meetings:   [name] Marital Status:     Activities of Daily Living No flowsheet data found.  Patient Education/ Literacy How often do you need to have someone help you when you read instructions, pamphlets, or other written materials from your doctor or pharmacy?: 3 - Sometimes What is the last grade level you completed in school?: 12th  Exercise    Diet Patient reports consuming 2 meals a day and 2 snack(s) a day Patient reports that his primary diet is: Regular Patient reports that he does have regular access to food.   Depression Screen PHQ 2/9 Scores 08/23/2019 08/16/2019 06/27/2019 05/16/2019 04/10/2019 02/23/2019 11/01/2018  PHQ - 2 Score 0 0 0 0 0 4 0  PHQ- 9 Score - - - - - 9 -     Fall Risk Fall Risk  08/23/2019 08/16/2019 06/27/2019 05/16/2019 04/10/2019  Falls in the past year? 0 0 0 0 0  Number falls in past yr: 0 - 0 0 -  Injury with Fall? 0 - 0 0 -  Comment - - - - -  Risk for fall due to : Impaired balance/gait Impaired balance/gait Impaired balance/gait Impaired balance/gait;History of fall(s) -  Follow up Falls evaluation completed Falls evaluation completed Falls evaluation completed Falls evaluation completed -     Objective:  [name] seemed alert and oriented and he participated appropriately during our telephone visit.  Blood Pressure Weight BMI  BP Readings from Last 3 Encounters:  08/16/19 135/80  06/27/19 127/76  05/16/19 135/76   Wt Readings from Last 3 Encounters:  08/16/19  241 lb 4 oz (109.4 kg)  06/27/19 242 lb 12.8 oz (110.1 kg)  05/16/19 246  lb (111.6 kg)   BMI Readings from Last 1 Encounters:  08/16/19 31.83 kg/m    *Unable to obtain current vital signs, weight, and BMI due to telephone visit type  Hearing/Vision  . [name] did not seem to have difficulty with hearing/understanding during the telephone conversation . Reports that he has not had a formal eye exam by an eye care professional within the past year . Reports that he has not had a formal hearing evaluation within the past year *Unable to fully assess hearing and vision during telephone visit type  Cognitive Function: 6CIT Screen 08/23/2019 08/22/2018  What Year? 0 points 0 points  What month? 0 points 3 points  What time? 0 points 0 points  Count back from 20 4 points 4 points  Months in reverse 4 points 4 points  Repeat phrase 2 points 2 points  Total Score 10 13   (Normal:0-7, Significant for Dysfunction: >8)  Normal Cognitive Function Screening:    Immunization & Health Maintenance Record Immunization History  Administered Date(s) Administered  . Influenza,inj,Quad PF,6+ Mos 02/12/2014, 02/26/2015, 05/01/2016, 02/09/2017, 02/07/2018, 01/24/2019  . Pneumococcal Conjugate-13 02/07/2018  . Tdap 12/05/2011    Health Maintenance  Topic Date Due  . COVID-19 Vaccine (1) Never done  . INFLUENZA VACCINE  11/19/2019  . TETANUS/TDAP  12/04/2021  . COLONOSCOPY  04/18/2025  . Hepatitis C Screening  Completed  . HIV Screening  Completed       Assessment  This is a routine wellness examination for [name].  Health Maintenance: Due or Overdue Health Maintenance Due  Topic Date Due  . COVID-19 Vaccine (1) Never done    [name] does not need a referral for Community Assistance: Care Management:   no Social Work:    no Prescription Assistance:  no Nutrition/Diabetes Education:  no   Plan:  Personalized Goals Goals Addressed            This Visit's Progress    . Patient Stated       08/23/2019 AWV Goal: Exercise for General Health   Patient will verbalize understanding of the benefits of increased physical activity:  Exercising regularly is important. It will improve your overall fitness, flexibility, and endurance.  Regular exercise also will improve your overall health. It can help you control your weight, reduce stress, and improve your bone density.  Over the next year, patient will increase physical activity as tolerated with a goal of at least 150 minutes of moderate physical activity per week.   You can tell that you are exercising at a moderate intensity if your heart starts beating faster and you start breathing faster but can still hold a conversation.  Moderate-intensity exercise ideas include:  Walking 1 mile (1.6 km) in about 15 minutes  Biking  Hiking  Golfing  Dancing  Water aerobics  Patient will verbalize understanding of everyday activities that increase physical activity by providing examples like the following: ? Yard work, such as: ? Pushing a Conservation officer, nature ? Raking and bagging leaves ? Washing your car ? Pushing a stroller ? Shoveling snow ? Gardening ? Washing windows or floors  Patient will be able to explain general safety guidelines for exercising:   Before you start a new exercise program, talk with your health care provider.  Do not exercise so much that you hurt yourself, feel dizzy, or get very short of breath.  Wear comfortable clothes and wear shoes with good support.  Drink plenty of water while you  exercise to prevent dehydration or heat stroke.  Work out until your breathing and your heartbeat get faster.       Personalized Health Maintenance & Screening Recommendations    Lung Cancer Screening Recommended: yes (Low Dose CT Chest recommended if Age 32-80 years, 30 pack-year currently smoking OR have quit w/in past 15 years) Hepatitis C Screening recommended: no HIV Screening  recommended: no  Advanced Directives: Written information was not prepared per patient's request.  Referrals & Orders No orders of the defined types were placed in this encounter.   Follow-up Plan . Follow-up with [name], MD as planned . Schedule eye exam   I have personally reviewed and noted the following in the patient's chart:   . Medical and social history . Use of alcohol, tobacco or illicit drugs  . Current medications and supplements . Functional ability and status . Nutritional status . Physical activity . Advanced directives . List of other physicians . Hospitalizations, surgeries, and ER visits in previous 12 months . Vitals . Screenings to include cognitive, depression, and falls . Referrals and appointments  In addition, I have reviewed and discussed with [name] certain preventive protocols, quality metrics, and best practice recommendations. A written personalized care plan for preventive services as well as general preventive health recommendations is available and can be mailed to the patient at his request.      Felicity Coyer, LPN    [id number]

## 2019-08-23 NOTE — Patient Instructions (Addendum)
Licensed Clinical Social Worker Visit Information  Goals we discussed today:  Goals Addressed            This Visit's Progress   . COMPLETED: "I need a ride to get my B12 shot" (pt-stated)       Current Barriers:  . Film/video editor.  . Literacy barriers . Transportation barriers in client with Chronic Diagnoses of DDD, Depressin Apraxia of Speech, CAD GAD, Hx MI, HTN, Infantile Cerebral Palsy  . Cognitive Deficits  Nurse Case Manager Clinical Goal(s):  [name] Over the next 7 days, patient will have transportation arranged for B12 injection that is scheduled for 08/31/2019 at [facility] .  Interventions:    [name] had phone conversation with [name] representative regarding client transport arrangement for 08/31/2019 B12 injection appointment for client at [facility]  [name] informed client of [name] transport arrangements for client for 08/31/2019 client appointment at [facility]  Talked with client about client's social work needs   Talked with client about headache issues of client  Talked with client about social support network (has support from his younger sister)  Talked with client about client decreased energy . Collaborated with RNCM about nursing needs of client  Patient Self Care Activities:  . Currently UNABLE TO independently drive to medical appointments.   Please see past updates related to this goal by clicking on the "Past Updates" button in the selected goal        Materials Provided: No  Follow Up Plan: [name] to call client in next 4 weeks to talk with client about social work needs of client at that time  The patient verbalized understanding of instructions provided today and declined a print copy of patient instruction materials.   [name].Emani Morad MSW, [name] Licensed Clinical Social Worker [facility] Care Management [phone]

## 2019-08-28 ENCOUNTER — Other Ambulatory Visit: Payer: Self-pay | Admitting: Family Medicine

## 2019-08-28 DIAGNOSIS — F3341 Major depressive disorder, recurrent, in partial remission: Secondary | ICD-10-CM

## 2019-08-28 DIAGNOSIS — F411 Generalized anxiety disorder: Secondary | ICD-10-CM

## 2019-08-29 ENCOUNTER — Telehealth: Payer: Self-pay | Admitting: *Deleted

## 2019-08-29 ENCOUNTER — Ambulatory Visit: Payer: Medicare Other | Admitting: *Deleted

## 2019-08-29 DIAGNOSIS — I25118 Atherosclerotic heart disease of native coronary artery with other forms of angina pectoris: Secondary | ICD-10-CM

## 2019-08-29 DIAGNOSIS — I10 Essential (primary) hypertension: Secondary | ICD-10-CM

## 2019-08-29 DIAGNOSIS — I252 Old myocardial infarction: Secondary | ICD-10-CM

## 2019-08-29 DIAGNOSIS — G4489 Other headache syndrome: Secondary | ICD-10-CM

## 2019-08-29 DIAGNOSIS — J441 Chronic obstructive pulmonary disease with (acute) exacerbation: Secondary | ICD-10-CM | POA: Diagnosis not present

## 2019-08-29 DIAGNOSIS — F329 Major depressive disorder, single episode, unspecified: Secondary | ICD-10-CM | POA: Diagnosis not present

## 2019-08-29 NOTE — Patient Instructions (Signed)
Visit Information  Goals Addressed            This Visit's Progress     Patient Stated   . "I want this headache to get better" (pt-stated)       CARE PLAN ENTRY (see longtitudinal plan of care for additional care plan information)  New onset of persistent headache in patient with hypertension, cerebral palsy, and seizure disorder.   Current Barriers:  [name] Knowledge Deficits related to cause and treatment of headache . Literacy barriers . Transportation  Nurse Case Manager Clinical Goal(s):  [name] Over the next 30 days, patient will talk with RN Care Manager regarding recurrent headaches  Interventions:  . Chart reviewed, including recent office notes and imaging reports . Talked with patient by telephone today.  o He sounded like he had some nasal congestion o Patient states that several people have told him it might be stress related but he doesn't necessarily feel stressed o He can't identify any causative factors . Discussed current headache symptoms o Headache resolved for a few days but returned . Reviewed and discussed Medications: o Using flonase and Allegra daily o Prednisone taper helped with headaches . RN will collaborate with PCP regarding return of headaches . Advised patient to reach out to PCP with any new or worsening symptoms . Advised patient to seek emergency medical care as needed . Encouraged patient to reach out to [facility] team as needed  Patient Self Care Activities:  . Patient verbalizes understanding of plan to assess headache . Performs ADL's independently . Unable to independently drive or read  Please see past updates related to this goal by clicking on the "Past Updates" button in the selected goal         The patient verbalized understanding of instructions provided today and declined a print copy of patient instruction materials.   Follow-up Plan The care management team will reach out to the patient again over the next 30 days.   [name], BSN, RN-BC Embedded Chronic Care Manager [facility] [place] [facility] Direct Dial: [phone]

## 2019-08-29 NOTE — Telephone Encounter (Signed)
Pt. Needs to be seen for this. Thanks, WS 

## 2019-08-29 NOTE — Chronic Care Management (AMB) (Signed)
Chronic Care Management   Follow Up Note   08/29/2019 Name: [name] MRN: [id number] DOB: [birth year]  Referred by: [name], MD Reason for referral : Chronic Care Management (RN follow up)   [name] is a 64 y.o. year old male who is a primary care patient of [name], [name], MD. The CCM team was consulted for assistance with chronic disease management and care coordination needs.    Review of patient status, including review of consultants reports, relevant laboratory and other test results, and collaboration with appropriate care team members and the patient's provider was performed as part of comprehensive patient evaluation and provision of chronic care management services.    SDOH (Social Determinants of Health) assessments performed: No See Care Plan activities for detailed interventions related to [facility])     Outpatient Encounter Medications as of 08/29/2019  Medication Sig  . acetaminophen (TYLENOL) 325 MG tablet Take 2 tablets (650 mg total) by mouth every 6 (six) hours as needed for mild pain or headache.  . albuterol (PROAIR HFA) 108 (90 Base) MCG/ACT inhaler 1-2 puffs every 6 hours as needed wheezing or shortness of breath.  [name] aspirin EC 81 MG tablet Take 1 tablet (81 mg total) by mouth daily.  [name] atorvastatin (LIPITOR) 40 MG tablet TAKE 1 TABLET ONCE DAILY AT 6PM  . buPROPion (WELLBUTRIN SR) 150 MG 12 hr tablet Take 1 tablet (150 mg total) by mouth daily with breakfast.  . butalbital-acetaminophen-caffeine (ESGIC) 50-325-40 MG tablet Take 1 tablet by mouth every 6 (six) hours as needed for headache.  . celecoxib (CELEBREX) 200 MG capsule Take 1 capsule (200 mg total) by mouth daily as needed.  . cetaphil (CETAPHIL) lotion Apply 1 application topically 2 (two) times daily. For rash. Apply after washing with [facility] shower gel and rinsing with clear warm water.  . DULoxetine (CYMBALTA) 60 MG capsule Take 1 capsule (60 mg total) by mouth daily. WITH A FULL  STOMACH AT SUPPER TIME  . fexofenadine (ALLEGRA) 180 MG tablet Take 1 tablet (180 mg total) by mouth daily. For allergy symptoms  . fluocinonide-emollient (LIDEX-E) 0.05 % cream Apply 1 application topically 2 (two) times daily. To affected areas  . fluticasone (FLONASE) 50 MCG/ACT nasal spray Place 2 sprays into both nostrils daily as needed for allergies or rhinitis.  . fluticasone furoate-vilanterol (BREO ELLIPTA) 100-25 MCG/INH AEPB Inhale 1 puff into the lungs daily.  . Ipratropium-Albuterol (COMBIVENT RESPIMAT) 20-100 MCG/ACT AERS respimat Inhale 1 puff into the lungs every 6 (six) hours  . isosorbide mononitrate (IMDUR) 60 MG 24 hr tablet Take 1 tablet (60 mg total) by mouth daily.  . metoprolol tartrate (LOPRESSOR) 25 MG tablet Take 1 tablet (25 mg total) by mouth 2 (two) times daily.  . mirtazapine (REMERON) 15 MG tablet Take 1 tablet (15 mg total) by mouth at bedtime. For sleep  . nitroGLYCERIN (NITROSTAT) 0.4 MG SL tablet Place 1 tablet (0.4 mg total) under the tongue every 5 (five) minutes x 3 doses as needed for chest pain.  . pantoprazole (PROTONIX) 40 MG tablet TAKE  (1)  TABLET TWICE A DAY.  . tamsulosin (FLOMAX) 0.4 MG CAPS capsule Take 2 capsules (0.8 mg total) by mouth daily after supper.   Facility-Administered Encounter Medications as of 08/29/2019  Medication  . cyanocobalamin ((VITAMIN B-12)) injection 1,000 mcg     RN Care Plan   . "I want this headache to get better" (pt-stated)       CARE PLAN ENTRY (see  longtitudinal plan of care for additional care plan information)  New onset of persistent headache in patient with hypertension, cerebral palsy, and seizure disorder.   Current Barriers:  [name] Knowledge Deficits related to cause and treatment of headache . Literacy barriers . Transportation  Nurse Case Manager Clinical Goal(s):  [name] Over the next 30 days, patient will talk with RN Care Manager regarding recurrent headaches  Interventions:  . Chart reviewed,  including recent office notes and imaging reports . Talked with patient by telephone today.  o He sounded like he had some nasal congestion o Patient states that several people have told him it might be stress related but he doesn't necessarily feel stressed o He can't identify any causative factors . Discussed current headache symptoms o Headache resolved for a few days but returned . Reviewed and discussed Medications: o Using flonase and Allegra daily o Prednisone taper helped with headaches . RN will collaborate with PCP regarding return of headaches . Advised patient to reach out to PCP with any new or worsening symptoms . Advised patient to seek emergency medical care as needed . Encouraged patient to reach out to [facility] team as needed  Patient Self Care Activities:  . Patient verbalizes understanding of plan to assess headache . Performs ADL's independently . Unable to independently drive or read  Please see past updates related to this goal by clicking on the "Past Updates" button in the selected goal          Plan:   The care management team will reach out to the patient again over the next 30 days.   [name], BSN, RN-BC Embedded Chronic Care Manager [facility] [place] Management Direct Dial: [phone]

## 2019-08-29 NOTE — Telephone Encounter (Signed)
08/29/2019  I talked with [name] by telephone today regarding CCM. He reports that he started having headaches again a week or so ago. He took a BC powder yesterday and that helped, but he knows he's not supposed to take those.  It's been suggested by family that stress is the cause but he states he doesn't really feel stressed. He can't identify any causative factors.   I will forward to Dr Livia Snellen for review and recommendation.   [name], BSN, RN-BC Embedded Chronic Care Manager [facility] [place] Management Direct Dial: [phone]

## 2019-08-31 ENCOUNTER — Other Ambulatory Visit: Payer: Self-pay

## 2019-08-31 ENCOUNTER — Ambulatory Visit (INDEPENDENT_AMBULATORY_CARE_PROVIDER_SITE_OTHER): Payer: Medicare Other | Admitting: *Deleted

## 2019-08-31 DIAGNOSIS — E538 Deficiency of other specified B group vitamins: Secondary | ICD-10-CM | POA: Diagnosis not present

## 2019-09-01 ENCOUNTER — Telehealth: Payer: Self-pay

## 2019-09-01 NOTE — Telephone Encounter (Signed)
Nurse left a message to call our office two days earlier but patient has not called back. Note closed.

## 2019-09-20 ENCOUNTER — Ambulatory Visit (INDEPENDENT_AMBULATORY_CARE_PROVIDER_SITE_OTHER): Payer: Medicare Other | Admitting: Licensed Clinical Social Worker

## 2019-09-20 ENCOUNTER — Ambulatory Visit: Payer: Medicare Other | Admitting: *Deleted

## 2019-09-20 DIAGNOSIS — G40909 Epilepsy, unspecified, not intractable, without status epilepticus: Secondary | ICD-10-CM

## 2019-09-20 DIAGNOSIS — G809 Cerebral palsy, unspecified: Secondary | ICD-10-CM

## 2019-09-20 DIAGNOSIS — I10 Essential (primary) hypertension: Secondary | ICD-10-CM

## 2019-09-20 DIAGNOSIS — I252 Old myocardial infarction: Secondary | ICD-10-CM

## 2019-09-20 DIAGNOSIS — I25118 Atherosclerotic heart disease of native coronary artery with other forms of angina pectoris: Secondary | ICD-10-CM

## 2019-09-20 DIAGNOSIS — M5136 Other intervertebral disc degeneration, lumbar region: Secondary | ICD-10-CM

## 2019-09-20 DIAGNOSIS — G4489 Other headache syndrome: Secondary | ICD-10-CM

## 2019-09-20 DIAGNOSIS — K219 Gastro-esophageal reflux disease without esophagitis: Secondary | ICD-10-CM

## 2019-09-20 DIAGNOSIS — E538 Deficiency of other specified B group vitamins: Secondary | ICD-10-CM

## 2019-09-20 DIAGNOSIS — F411 Generalized anxiety disorder: Secondary | ICD-10-CM

## 2019-09-20 DIAGNOSIS — F32A Depression, unspecified: Secondary | ICD-10-CM

## 2019-09-20 DIAGNOSIS — R482 Apraxia: Secondary | ICD-10-CM

## 2019-09-20 NOTE — Patient Instructions (Addendum)
Licensed Clinical Social Worker Visit Information  Goals we discussed today:    "I need a ride to get my B12 shot" (pt-stated)   Current Barriers:  Film/video editor.  Literacy barriers  Transportation barriers in client with Chronic Diagnoses of DDD, Depressin Apraxia of Speech, CAD GAD, Hx MI, HTN, Infantile Cerebral Palsy  Cognitive Deficits  Nurse Case Manager Clinical Goal(s):  Over the next 7 days, patient will have transportation arranged for B12 injection that is scheduled for October 02, 2019 at 10:00 AM at [facility]  Interventions:  [name] had phone conversation with [name] representative regarding client transport arrangement for 10/02/19 appointment for client at [place] informed client of [place] transport arrangements for client for 10/02/19 client appointment at [facility]  Talked with client about client's social work needs  Patient Self Care Activities:  Currently UNABLE TO independently drive to medical appointments.    Materials Provided: No  Follow Up Plan: [name] to call client in next 4 weeks to assess the social work needs of client at that time  The patient verbalized understanding of instructions provided today and declined a print copy of patient instruction materials.   [name].Jauna Raczynski MSW, [name] Licensed Clinical Social Worker [facility] Care Management [phone]

## 2019-09-20 NOTE — Chronic Care Management (AMB) (Signed)
Chronic Care Management    Clinical Social Work Follow Up Note  09/20/2019 Name: [name] MRN: [id number] DOB: 29-Aug-1955  [name] is a 64 y.o. year old male who is a primary care patient of [name], [name], MD. The CCM team was consulted for assistance with community resources.   Review of patient status, including review of consultants reports, other relevant assessments, and collaboration with appropriate care team members and the patient's provider was performed as part of comprehensive patient evaluation and provision of chronic care management services.    SDOH (Social Determinants of Health) assessments performed: Yes;risk of tobacco use; risk of depression; risk of transport needs     Chronic Care Management from 02/21/2019 in [place]  PHQ-9 Total Score  9      GAD 7 : Generalized Anxiety Score 04/10/2019 05/01/2016 03/31/2016  Nervous, Anxious, on Edge 0 1 3  Control/stop worrying 0 1 3  Worry too much - different things 0 1 3  Trouble relaxing 0 0 2  Restless 0 0 2  Easily annoyed or irritable 0 0 0  Afraid - awful might happen 0 0 0  Total GAD 7 Score 0 3 13  Anxiety Difficulty Not difficult at all Not difficult at all Not difficult at all    Outpatient Encounter Medications as of 09/20/2019  Medication Sig  . acetaminophen (TYLENOL) 325 MG tablet Take 2 tablets (650 mg total) by mouth every 6 (six) hours as needed for mild pain or headache.  . albuterol (PROAIR HFA) 108 (90 Base) MCG/ACT inhaler 1-2 puffs every 6 hours as needed wheezing or shortness of breath.  [name] aspirin EC 81 MG tablet Take 1 tablet (81 mg total) by mouth daily.  [name] atorvastatin (LIPITOR) 40 MG tablet TAKE 1 TABLET ONCE DAILY AT 6PM  . buPROPion (WELLBUTRIN SR) 150 MG 12 hr tablet Take 1 tablet (150 mg total) by mouth daily with breakfast.  . butalbital-acetaminophen-caffeine (ESGIC) 50-325-40 MG tablet Take 1 tablet by mouth every 6 (six) hours as needed for headache.   . celecoxib (CELEBREX) 200 MG capsule Take 1 capsule (200 mg total) by mouth daily as needed.  . cetaphil (CETAPHIL) lotion Apply 1 application topically 2 (two) times daily. For rash. Apply after washing with [facility] shower gel and rinsing with clear warm water.  . DULoxetine (CYMBALTA) 60 MG capsule Take 1 capsule (60 mg total) by mouth daily. WITH A FULL STOMACH AT SUPPER TIME  . fexofenadine (ALLEGRA) 180 MG tablet Take 1 tablet (180 mg total) by mouth daily. For allergy symptoms  . fluocinonide-emollient (LIDEX-E) 0.05 % cream Apply 1 application topically 2 (two) times daily. To affected areas  . fluticasone (FLONASE) 50 MCG/ACT nasal spray Place 2 sprays into both nostrils daily as needed for allergies or rhinitis.  . fluticasone furoate-vilanterol (BREO ELLIPTA) 100-25 MCG/INH AEPB Inhale 1 puff into the lungs daily.  . Ipratropium-Albuterol (COMBIVENT RESPIMAT) 20-100 MCG/ACT AERS respimat Inhale 1 puff into the lungs every 6 (six) hours  . isosorbide mononitrate (IMDUR) 60 MG 24 hr tablet Take 1 tablet (60 mg total) by mouth daily.  . metoprolol tartrate (LOPRESSOR) 25 MG tablet Take 1 tablet (25 mg total) by mouth 2 (two) times daily.  . mirtazapine (REMERON) 15 MG tablet Take 1 tablet (15 mg total) by mouth at bedtime. For sleep  . nitroGLYCERIN (NITROSTAT) 0.4 MG SL tablet Place 1 tablet (0.4 mg total) under the tongue every 5 (five) minutes x 3 doses  as needed for chest pain.  . pantoprazole (PROTONIX) 40 MG tablet TAKE  (1)  TABLET TWICE A DAY.  . tamsulosin (FLOMAX) 0.4 MG CAPS capsule Take 2 capsules (0.8 mg total) by mouth daily after supper.   Facility-Administered Encounter Medications as of 09/20/2019  Medication  . cyanocobalamin ((VITAMIN B-12)) injection 1,000 mcg     Goals Addressed              .  "I need a ride to get my B12 shot" (pt-stated)        Current Barriers:   Film/video editor.   Literacy barriers  Transportation barriers in client with  Chronic Diagnoses of DDD, Depressin Apraxia of Speech, CAD GAD, Hx MI, HTN, Infantile Cerebral Palsy   Cognitive Deficits  Nurse Case Manager Clinical Goal(s):   Over the next 7 days, patient will have transportation arranged for B12 injection that is scheduled for October 02, 2019 at 10:00 AM at [facility]  Interventions:   [name] had phone conversation with [name] representative regarding client transport arrangement for 10/02/19 appointment for client at [place] informed client of [place] transport arrangements for client for 10/02/19 client appointment at [facility]  Talked with client about client's social work needs   Patient Self Care Activities:   Currently UNABLE TO independently drive to medical appointments.     Follow Up Plan: Follow Up Plan: [name] to call client in next 4 weeks to talk with client about social work needs of client at that time  Priscilla Achee.Jasha Hodzic MSW, [name] Licensed Clinical Social Worker [facility] Care Management [phone]

## 2019-09-20 NOTE — Chronic Care Management (AMB) (Signed)
Chronic Care Management   Follow Up Note   09/20/2019 Name: [name] MRN: [id number] DOB: [birth year]  Referred by: [name], MD Reason for referral : Chronic Care Management (RN follow up)   [name] is a 64 y.o. year old male who is a primary care patient of [name], [name], MD. The CCM team was consulted for assistance with chronic disease management and care coordination needs.    Review of patient status, including review of consultants reports, relevant laboratory and other test results, and collaboration with appropriate care team members and the patient's provider was performed as part of comprehensive patient evaluation and provision of chronic care management services.    Subjective: I spoke with [name] by telephone today regarding management of his chronic medical conditions. He reports that his headaches have resolved and that overall he is doing well. He does not have any concerns or questions regarding management of his chronic medical conditions at this time.   SDOH (Social Determinants of Health) assessments performed: No See Care Plan activities for detailed interventions related to [facility])     Outpatient Encounter Medications as of 09/20/2019  Medication Sig  . acetaminophen (TYLENOL) 325 MG tablet Take 2 tablets (650 mg total) by mouth every 6 (six) hours as needed for mild pain or headache.  . albuterol (PROAIR HFA) 108 (90 Base) MCG/ACT inhaler 1-2 puffs every 6 hours as needed wheezing or shortness of breath.  [name] aspirin EC 81 MG tablet Take 1 tablet (81 mg total) by mouth daily.  [name] atorvastatin (LIPITOR) 40 MG tablet TAKE 1 TABLET ONCE DAILY AT 6PM  . buPROPion (WELLBUTRIN SR) 150 MG 12 hr tablet Take 1 tablet (150 mg total) by mouth daily with breakfast.  . butalbital-acetaminophen-caffeine (ESGIC) 50-325-40 MG tablet Take 1 tablet by mouth every 6 (six) hours as needed for headache.  . celecoxib (CELEBREX) 200 MG capsule Take 1 capsule (200 mg total)  by mouth daily as needed.  . cetaphil (CETAPHIL) lotion Apply 1 application topically 2 (two) times daily. For rash. Apply after washing with [facility] shower gel and rinsing with clear warm water.  . DULoxetine (CYMBALTA) 60 MG capsule Take 1 capsule (60 mg total) by mouth daily. WITH A FULL STOMACH AT SUPPER TIME  . fexofenadine (ALLEGRA) 180 MG tablet Take 1 tablet (180 mg total) by mouth daily. For allergy symptoms  . fluocinonide-emollient (LIDEX-E) 0.05 % cream Apply 1 application topically 2 (two) times daily. To affected areas  . fluticasone (FLONASE) 50 MCG/ACT nasal spray Place 2 sprays into both nostrils daily as needed for allergies or rhinitis.  . fluticasone furoate-vilanterol (BREO ELLIPTA) 100-25 MCG/INH AEPB Inhale 1 puff into the lungs daily.  . Ipratropium-Albuterol (COMBIVENT RESPIMAT) 20-100 MCG/ACT AERS respimat Inhale 1 puff into the lungs every 6 (six) hours  . isosorbide mononitrate (IMDUR) 60 MG 24 hr tablet Take 1 tablet (60 mg total) by mouth daily.  . metoprolol tartrate (LOPRESSOR) 25 MG tablet Take 1 tablet (25 mg total) by mouth 2 (two) times daily.  . mirtazapine (REMERON) 15 MG tablet Take 1 tablet (15 mg total) by mouth at bedtime. For sleep  . nitroGLYCERIN (NITROSTAT) 0.4 MG SL tablet Place 1 tablet (0.4 mg total) under the tongue every 5 (five) minutes x 3 doses as needed for chest pain.  . pantoprazole (PROTONIX) 40 MG tablet TAKE  (1)  TABLET TWICE A DAY.  . tamsulosin (FLOMAX) 0.4 MG CAPS capsule Take 2 capsules (0.8 mg total) by mouth  daily after supper.   Facility-Administered Encounter Medications as of 09/20/2019  Medication  . cyanocobalamin ((VITAMIN B-12)) injection 1,000 mcg      RN Care Plan   . "I need transportation to come in and get my B12 shot" (pt-stated)       Transportation needs in patient with infantile cerebral palsy, hypertension, and hx of MI  Current Barriers:  . Film/video editor.  . Literacy barriers . Transportation  barriers in patient with Chronic Diagnoses of HTN, GAD, Hx of MI, CAD, Apraxia of Speech, DDD, Depression  Nurse Case Manager Clinical Goal(s):  [name] Over the next 10 days, patient will work with [name] to have transportation arranged for his appointment at [name] on 10/02/19  Interventions:  . Reviewed appointment information with patient . Collaborated with [name], [name] . Advised patient that Nicki Reaper will assist in arranging transportation and will be in touch with him over the next 10 days   Patient Self Care Activities:  . Performs most ADLs but does have in home Aide and has help form his sisters for most all IADLs   Please see past updates related to this goal by clicking on the "Past Updates" button in the selected goal      . "I want this headache to get better" (pt-stated)       [place] (see longtitudinal plan of care for additional care plan information)  New onset of persistent headache in patient with hypertension, cerebral palsy, and seizure disorder.   Current Barriers:  [name] Knowledge Deficits related to cause and treatment of headache . Literacy barriers . Transportation  Nurse Case Manager Clinical Goal(s):  [name] Over the next 30 days, patient will talk with RN Care Manager regarding recurrent headaches  Interventions:  . Chart reviewed, including recent office notes and imaging reports . Talked with patient by telephone today.  o Reports that headache has resolved. He hasn't had one "in a while" . Advised patient to reach out to PCP with any new or worsening symptoms . Advised patient to seek emergency medical care as needed . Encouraged patient to reach out to [facility] team as needed  Patient Self Care Activities:  . Patient verbalizes understanding of plan to assess headache . Performs ADL's independently . Unable to independently drive or read  Please see past updates related to this goal by clicking on the "Past Updates" button in the  selected goal         Plan:   The care management team will reach out to the patient again over the next 30 days.   [name], BSN, RN-BC Embedded Chronic Care Manager [facility] / [place] Management Direct Dial: [phone]

## 2019-09-20 NOTE — Patient Instructions (Signed)
Visit Information  Goals Addressed            This Visit's Progress     Patient Stated   . "I need transportation to come in and get my B12 shot" (pt-stated)       Transportation needs in patient with infantile cerebral palsy, hypertension, and hx of MI  Current Barriers:  . Film/video editor.  . Literacy barriers . Transportation barriers in patient with Chronic Diagnoses of HTN, GAD, Hx of MI, CAD, Apraxia of Speech, DDD, Depression  Nurse Case Manager Clinical Goal(s):  [name] Over the next 10 days, patient will work with [name] to have transportation arranged for his appointment at [facility] on 10/02/19  Interventions:  . Reviewed appointment information with patient . Collaborated with [name], [name] . Advised patient that Nicki Reaper will assist in arranging transportation and will be in touch with him over the next 10 days   Patient Self Care Activities:  . Performs most ADLs but does have in home Aide and has help form his sisters for most all IADLs   Please see past updates related to this goal by clicking on the "Past Updates" button in the selected goal      . "I want this headache to get better" (pt-stated)       [place] (see longtitudinal plan of care for additional care plan information)  New onset of persistent headache in patient with hypertension, cerebral palsy, and seizure disorder.   Current Barriers:  [name] Knowledge Deficits related to cause and treatment of headache . Literacy barriers . Transportation  Nurse Case Manager Clinical Goal(s):  [name] Over the next 30 days, patient will talk with RN Care Manager regarding recurrent headaches  Interventions:  . Chart reviewed, including recent office notes and imaging reports . Talked with patient by telephone today.  o Reports that headache has resolved. He hasn't had one "in a while" . Advised patient to reach out to PCP with any new or worsening symptoms . Advised patient to seek emergency  medical care as needed . Encouraged patient to reach out to [facility] team as needed  Patient Self Care Activities:  . Patient verbalizes understanding of plan to assess headache . Performs ADL's independently . Unable to independently drive or read  Please see past updates related to this goal by clicking on the "Past Updates" button in the selected goal         The patient verbalized understanding of instructions provided today and declined a print copy of patient instruction materials.   Follow-up Plan The care management team will reach out to the patient again over the next 30 days.   [name], BSN, RN-BC Embedded Chronic Care Manager [facility] / [place] Management Direct Dial: [phone]

## 2019-09-28 ENCOUNTER — Ambulatory Visit: Payer: Medicare Other | Admitting: *Deleted

## 2019-09-28 ENCOUNTER — Ambulatory Visit: Payer: Self-pay | Admitting: Licensed Clinical Social Worker

## 2019-09-28 ENCOUNTER — Telehealth: Payer: Self-pay

## 2019-09-28 DIAGNOSIS — I25118 Atherosclerotic heart disease of native coronary artery with other forms of angina pectoris: Secondary | ICD-10-CM

## 2019-09-28 DIAGNOSIS — J441 Chronic obstructive pulmonary disease with (acute) exacerbation: Secondary | ICD-10-CM

## 2019-09-28 DIAGNOSIS — K219 Gastro-esophageal reflux disease without esophagitis: Secondary | ICD-10-CM

## 2019-09-28 DIAGNOSIS — M5136 Other intervertebral disc degeneration, lumbar region: Secondary | ICD-10-CM

## 2019-09-28 DIAGNOSIS — E538 Deficiency of other specified B group vitamins: Secondary | ICD-10-CM

## 2019-09-28 DIAGNOSIS — R482 Apraxia: Secondary | ICD-10-CM

## 2019-09-28 DIAGNOSIS — I252 Old myocardial infarction: Secondary | ICD-10-CM

## 2019-09-28 DIAGNOSIS — F411 Generalized anxiety disorder: Secondary | ICD-10-CM

## 2019-09-28 DIAGNOSIS — G809 Cerebral palsy, unspecified: Secondary | ICD-10-CM

## 2019-09-28 DIAGNOSIS — M51369 Other intervertebral disc degeneration, lumbar region without mention of lumbar back pain or lower extremity pain: Secondary | ICD-10-CM

## 2019-09-28 DIAGNOSIS — Z748 Other problems related to care provider dependency: Secondary | ICD-10-CM

## 2019-09-28 DIAGNOSIS — F32A Depression, unspecified: Secondary | ICD-10-CM

## 2019-09-28 DIAGNOSIS — I10 Essential (primary) hypertension: Secondary | ICD-10-CM

## 2019-09-28 NOTE — Chronic Care Management (AMB) (Signed)
Chronic Care Management   Follow Up Note   09/28/2019 Name: [name] MRN: [id number] DOB: [birth year]  Referred by: James Fraise, MD Reason for referral : Chronic Care Management (incoming patient call)   [name] is a 64 y.o. year old male who is a primary care patient of [name], [name], MD. The CCM team was consulted for assistance with chronic disease management and care coordination needs.    Review of patient status, including review of consultants reports, relevant laboratory and other test results, and collaboration with appropriate care team members and the patient's provider was performed as part of comprehensive patient evaluation and provision of chronic care management services.    Incoming call from patient regarding transportation to B12 injection appt next week.   SDOH (Social Determinants of Health) assessments performed: No See Care Plan activities for detailed interventions related to [facility])     Outpatient Encounter Medications as of 09/28/2019  Medication Sig  . acetaminophen (TYLENOL) 325 MG tablet Take 2 tablets (650 mg total) by mouth every 6 (six) hours as needed for mild pain or headache.  . albuterol (PROAIR HFA) 108 (90 Base) MCG/ACT inhaler 1-2 puffs every 6 hours as needed wheezing or shortness of breath.  [name] aspirin EC 81 MG tablet Take 1 tablet (81 mg total) by mouth daily.  [name] atorvastatin (LIPITOR) 40 MG tablet TAKE 1 TABLET ONCE DAILY AT 6PM  . buPROPion (WELLBUTRIN SR) 150 MG 12 hr tablet Take 1 tablet (150 mg total) by mouth daily with breakfast.  . butalbital-acetaminophen-caffeine (ESGIC) 50-325-40 MG tablet Take 1 tablet by mouth every 6 (six) hours as needed for headache.  . celecoxib (CELEBREX) 200 MG capsule Take 1 capsule (200 mg total) by mouth daily as needed.  . cetaphil (CETAPHIL) lotion Apply 1 application topically 2 (two) times daily. For rash. Apply after washing with [facility] shower gel and rinsing with clear warm water.    . DULoxetine (CYMBALTA) 60 MG capsule Take 1 capsule (60 mg total) by mouth daily. WITH A FULL STOMACH AT SUPPER TIME  . fexofenadine (ALLEGRA) 180 MG tablet Take 1 tablet (180 mg total) by mouth daily. For allergy symptoms  . fluocinonide-emollient (LIDEX-E) 0.05 % cream Apply 1 application topically 2 (two) times daily. To affected areas  . fluticasone (FLONASE) 50 MCG/ACT nasal spray Place 2 sprays into both nostrils daily as needed for allergies or rhinitis.  . fluticasone furoate-vilanterol (BREO ELLIPTA) 100-25 MCG/INH AEPB Inhale 1 puff into the lungs daily.  . Ipratropium-Albuterol (COMBIVENT RESPIMAT) 20-100 MCG/ACT AERS respimat Inhale 1 puff into the lungs every 6 (six) hours  . isosorbide mononitrate (IMDUR) 60 MG 24 hr tablet Take 1 tablet (60 mg total) by mouth daily.  . metoprolol tartrate (LOPRESSOR) 25 MG tablet Take 1 tablet (25 mg total) by mouth 2 (two) times daily.  . mirtazapine (REMERON) 15 MG tablet Take 1 tablet (15 mg total) by mouth at bedtime. For sleep  . nitroGLYCERIN (NITROSTAT) 0.4 MG SL tablet Place 1 tablet (0.4 mg total) under the tongue every 5 (five) minutes x 3 doses as needed for chest pain.  . pantoprazole (PROTONIX) 40 MG tablet TAKE  (1)  TABLET TWICE A DAY.  . tamsulosin (FLOMAX) 0.4 MG CAPS capsule Take 2 capsules (0.8 mg total) by mouth daily after supper.   Facility-Administered Encounter Medications as of 09/28/2019  Medication  . cyanocobalamin ((VITAMIN B-12)) injection 1,000 mcg      RN Care Plan   .  "I  need transportation to come in and get my B12 shot" (pt-stated)        Transportation needs in patient with infantile cerebral palsy, hypertension, and hx of MI  Current Barriers:  . Film/video editor.  . Literacy barriers . Transportation barriers in patient with Chronic Diagnoses of HTN, GAD, Hx of MI, CAD, Apraxia of Speech, DDD, Depression  Nurse Case Manager Clinical Goal(s):  [name] Over the next 10 days, patient will have  transportation to office visit for B12 injection  Interventions:  . Reviewed appointment information with patient . Previously collaborated with [employer], [name] . Chart reviewed . Advised patient that transportation has been arranged for his 10:00 B12 appt on 10/02/19  Patient Self Care Activities:  . Performs most ADLs but does have in home Aide and has help form his sisters for most all IADLs   Please see past updates related to this goal by clicking on the "Past Updates" button in the selected goal          Plan:   The care management team will reach out to the patient again over the next 30 days.   [name], BSN, RN-BC Embedded Chronic Care Manager [facility] / [place] Management Direct Dial: [phone]

## 2019-09-28 NOTE — Patient Instructions (Addendum)
Licensed Clinical Social Worker Visit Information  Goals we discussed today:   Goals    . Improve Insufficient Self Care Secondary to Grief (pt-stated)         Current Barriers:   Grief reaction to sister's death in patient with Chronic Diagnoses of COPD, Apaxia of Speech, CAD, HTN, GAD, DDD, Depression  depression  Nurse Case Manager Clinical Goal(s):  Over the next 30 days, patient will continue to talk with [name] regarding grief secondary to sister's death  Over the next 30 days, patient will work with RN to improve oral intake in presence of decreased appetite secondary to grief process  Interventions:  LCSWtalked with client about grief issues related to death of his older sister Talked with client about pain issues of client Encouraged client to talk with RNCM about nursing needs of client Talked with client about transport arrangements for his next B12 injection  [facility] at [facility] Talked with client about social support network (has support from 2 sisters)  Patient Self Care Activities:   Performs ADL's independently  Unable to perform IADLs independently. Has not have transportation arranged.   Initial goal documentation    Materials Provided: No  Follow Up Plan: [name] to call client in next 4 weeks to talk with client about his management of grief issues by client  The patient verbalized understanding of instructions provided today and declined a print copy of patient instruction materials.   [name].Sejla Marzano MSW, [name] Licensed Clinical Social Worker [facility] Care Management [phone]

## 2019-09-28 NOTE — Patient Instructions (Signed)
Visit Information  Goals Addressed              This Visit's Progress     Patient Stated   .  "I need transportation to come in and get my B12 shot" (pt-stated)        Transportation needs in patient with infantile cerebral palsy, hypertension, and hx of MI  Current Barriers:  . Film/video editor.  . Literacy barriers . Transportation barriers in patient with Chronic Diagnoses of HTN, GAD, Hx of MI, CAD, Apraxia of Speech, DDD, Depression  Nurse Case Manager Clinical Goal(s):  [name] Over the next 10 days, patient will have transportation to office visit for B12 injection  Interventions:  . Reviewed appointment information with patient . Previously collaborated with [employer], [name] . Chart reviewed . Advised patient that transportation has been arranged for his 10:00 B12 appt on 10/02/19  Patient Self Care Activities:  . Performs most ADLs but does have in home Aide and has help form his sisters for most all IADLs   Please see past updates related to this goal by clicking on the "Past Updates" button in the selected goal      .  COMPLETED: "I want this headache to get better" (pt-stated)        CARE PLAN ENTRY (see longtitudinal plan of care for additional care plan information)  New onset of persistent headache in patient with hypertension, cerebral palsy, and seizure disorder.   Current Barriers:  [name] Knowledge Deficits related to cause and treatment of headache . Literacy barriers . Transportation  Nurse Case Manager Clinical Goal(s):  [name] Over the next 30 days, patient will talk with RN Care Manager regarding recurrent headaches  Interventions:  . Chart reviewed, including recent office notes and imaging reports . Talked with patient by telephone today.  o Reports that headache has resolved. He hasn't had one "in a while" . Advised patient to reach out to PCP with any new or worsening symptoms . Advised patient to seek emergency medical care as  needed . Encouraged patient to reach out to [facility] team as needed  Patient Self Care Activities:  . Patient verbalizes understanding of plan to assess headache . Performs ADL's independently . Unable to independently drive or read  Please see past updates related to this goal by clicking on the "Past Updates" button in the selected goal         The patient verbalized understanding of instructions provided today and declined a print copy of patient instruction materials.   Follow-up Plan The care management team will reach out to the patient again over the next 30 days.   [name], BSN, RN-BC Embedded Chronic Care Manager [facility] / [place] [facility] Direct Dial: [phone]

## 2019-09-28 NOTE — Chronic Care Management (AMB) (Signed)
Chronic Care Management    Clinical Social Work Follow Up Note  09/28/2019 Name: [name] MRN: [id number] DOB: 10/21/1955  [name] is a 64 y.o. year old male who is a primary care patient of [name], [name], MD. The CCM team was consulted for assistance with [name] .   Review of patient status, including review of consultants reports, other relevant assessments, and collaboration with appropriate care team members and the patient's provider was performed as part of comprehensive patient evaluation and provision of chronic care management services.    SDOH (Social Determinants of Health) assessments performed: No; risk for tobacco use; risk for depression; risk for transport needs    Chronic Care Management from 02/21/2019 in [place]  PHQ-9 Total Score 9       GAD 7 : Generalized Anxiety Score 04/10/2019 05/01/2016 03/31/2016  Nervous, Anxious, on Edge 0 1 3  Control/stop worrying 0 1 3  Worry too much - different things 0 1 3  Trouble relaxing 0 0 2  Restless 0 0 2  Easily annoyed or irritable 0 0 0  Afraid - awful might happen 0 0 0  Total GAD 7 Score 0 3 13  Anxiety Difficulty Not difficult at all Not difficult at all Not difficult at all    Outpatient Encounter Medications as of 09/28/2019  Medication Sig  . acetaminophen (TYLENOL) 325 MG tablet Take 2 tablets (650 mg total) by mouth every 6 (six) hours as needed for mild pain or headache.  . albuterol (PROAIR HFA) 108 (90 Base) MCG/ACT inhaler 1-2 puffs every 6 hours as needed wheezing or shortness of breath.  [name] aspirin EC 81 MG tablet Take 1 tablet (81 mg total) by mouth daily.  [name] atorvastatin (LIPITOR) 40 MG tablet TAKE 1 TABLET ONCE DAILY AT 6PM  . buPROPion (WELLBUTRIN SR) 150 MG 12 hr tablet Take 1 tablet (150 mg total) by mouth daily with breakfast.  . butalbital-acetaminophen-caffeine (ESGIC) 50-325-40 MG tablet Take 1 tablet by mouth every 6 (six) hours as needed for  headache.  . celecoxib (CELEBREX) 200 MG capsule Take 1 capsule (200 mg total) by mouth daily as needed.  . cetaphil (CETAPHIL) lotion Apply 1 application topically 2 (two) times daily. For rash. Apply after washing with [facility] shower gel and rinsing with clear warm water.  . DULoxetine (CYMBALTA) 60 MG capsule Take 1 capsule (60 mg total) by mouth daily. WITH A FULL STOMACH AT SUPPER TIME  . fexofenadine (ALLEGRA) 180 MG tablet Take 1 tablet (180 mg total) by mouth daily. For allergy symptoms  . fluocinonide-emollient (LIDEX-E) 0.05 % cream Apply 1 application topically 2 (two) times daily. To affected areas  . fluticasone (FLONASE) 50 MCG/ACT nasal spray Place 2 sprays into both nostrils daily as needed for allergies or rhinitis.  . fluticasone furoate-vilanterol (BREO ELLIPTA) 100-25 MCG/INH AEPB Inhale 1 puff into the lungs daily.  . Ipratropium-Albuterol (COMBIVENT RESPIMAT) 20-100 MCG/ACT AERS respimat Inhale 1 puff into the lungs every 6 (six) hours  . isosorbide mononitrate (IMDUR) 60 MG 24 hr tablet Take 1 tablet (60 mg total) by mouth daily.  . metoprolol tartrate (LOPRESSOR) 25 MG tablet Take 1 tablet (25 mg total) by mouth 2 (two) times daily.  . mirtazapine (REMERON) 15 MG tablet Take 1 tablet (15 mg total) by mouth at bedtime. For sleep  . nitroGLYCERIN (NITROSTAT) 0.4 MG SL tablet Place 1 tablet (0.4 mg total) under the tongue every 5 (five) minutes x 3  doses as needed for chest pain.  . pantoprazole (PROTONIX) 40 MG tablet TAKE  (1)  TABLET TWICE A DAY.  . tamsulosin (FLOMAX) 0.4 MG CAPS capsule Take 2 capsules (0.8 mg total) by mouth daily after supper.   Facility-Administered Encounter Medications as of 09/28/2019  Medication  . cyanocobalamin ((VITAMIN B-12)) injection 1,000 mcg    Goals     . Improve Insufficient Self Care Secondary to Grief (pt-stated)         Current Barriers:   Grief reaction to sister's death in patient with Chronic Diagnoses of COPD,  Apaxia of Speech, CAD, HTN, GAD, DDD, Depression  depression  Nurse Case Manager Clinical Goal(s):  Over the next 30 days, patient will continue to talk with [name] regarding grief secondary to sister's death  Over the next 30 days, patient will work with RN to improve oral intake in presence of decreased appetite secondary to grief process  Interventions:  LCSWtalked with client about grief issues related to death of his older sister Talked with client about pain issues of client Encouraged client to talk with RNCM about nursing needs of client Talked with client about transport arrangements for his next B12 injection  appointment at [name] Talked with client about social support network (has support from 2 sisters)  Patient Self Care Activities:   Performs ADL's independently  Unable to perform IADLs independently. Has not have transportation arranged.   Initial goal documentation   Follow Up Plan:[name] to call client in next 4 weeks to talk with client about his management of grief issues by client  [name].Cherokee Clowers MSW, [name] Licensed Clinical Social Worker [facility] Phone: [phone]

## 2019-10-02 ENCOUNTER — Ambulatory Visit (INDEPENDENT_AMBULATORY_CARE_PROVIDER_SITE_OTHER): Payer: Medicare Other

## 2019-10-02 ENCOUNTER — Other Ambulatory Visit: Payer: Self-pay

## 2019-10-02 DIAGNOSIS — E538 Deficiency of other specified B group vitamins: Secondary | ICD-10-CM | POA: Diagnosis not present

## 2019-10-02 NOTE — Progress Notes (Signed)
Patient given b12 injection and tolerated well.  

## 2019-10-09 ENCOUNTER — Telehealth: Payer: Self-pay

## 2019-10-16 ENCOUNTER — Ambulatory Visit: Payer: Self-pay | Admitting: Licensed Clinical Social Worker

## 2019-10-16 DIAGNOSIS — G809 Cerebral palsy, unspecified: Secondary | ICD-10-CM

## 2019-10-16 DIAGNOSIS — I10 Essential (primary) hypertension: Secondary | ICD-10-CM | POA: Diagnosis not present

## 2019-10-16 DIAGNOSIS — I252 Old myocardial infarction: Secondary | ICD-10-CM | POA: Diagnosis not present

## 2019-10-16 DIAGNOSIS — I25118 Atherosclerotic heart disease of native coronary artery with other forms of angina pectoris: Secondary | ICD-10-CM | POA: Diagnosis not present

## 2019-10-16 DIAGNOSIS — F411 Generalized anxiety disorder: Secondary | ICD-10-CM

## 2019-10-16 DIAGNOSIS — F32A Depression, unspecified: Secondary | ICD-10-CM

## 2019-10-16 DIAGNOSIS — M5136 Other intervertebral disc degeneration, lumbar region: Secondary | ICD-10-CM

## 2019-10-16 DIAGNOSIS — R482 Apraxia: Secondary | ICD-10-CM

## 2019-10-16 DIAGNOSIS — J441 Chronic obstructive pulmonary disease with (acute) exacerbation: Secondary | ICD-10-CM | POA: Diagnosis not present

## 2019-10-16 DIAGNOSIS — F329 Major depressive disorder, single episode, unspecified: Secondary | ICD-10-CM | POA: Diagnosis not present

## 2019-10-16 NOTE — Patient Instructions (Addendum)
Licensed Clinical Social Worker Visit Information  Goals we discussed today:  Goals Addressed               . "I need a ride to get my B12 shot" (pt-stated)         Current Barriers:   Film/video editor.   Literacy barriers  Transportation barriers in client with Chronic Diagnoses of DDD, Depressin Apraxia of Speech, CAD GAD, Hx MI, HTN, Infantile Cerebral Palsy   Cognitive Deficits  Nurse Case Manager Clinical Goal(s):  Over the next 7 days, patient will have transportation arranged for B12 injection that is scheduled for 11/01/2019 at 10:30 AM at [facility]   Interventions:  [name] talked with client about his upcoming appointment at [facility] on 11/01/2019 for B12 injection  [name] talked with client about transport arrangements for 11/01/2019 appointment for client  Talked with client about pain issues (he said he hurt one of his small toes recently and nail on toe came off)  Patient Self Care Activities:   Currently UNABLE TO independently drive to medical appointments.   Please see past updates related to this goal by clicking on the "Past Updates" button in the selected goal       Follow Up Plan: [name] to call client in next 4 weeks to talk with client about social work needs of client at that time  Materials Provided: No  The patient verbalized understanding of instructions provided today and declined a print copy of patient instruction materials.   [name].Aaminah Forrester MSW, [name] Licensed Clinical Social Worker [facility] Care Management [phone]

## 2019-10-16 NOTE — Chronic Care Management (AMB) (Signed)
Chronic Care Management    Clinical Social Work Follow Up Note  10/16/2019 Name: [name] MRN: [id number] DOB: Dec 24, 1955  [name] is a 64 y.o. year old male who is a primary care patient of [name], [name], MD. The CCM team was consulted for assistance with [name] .   Review of patient status, including review of consultants reports, other relevant assessments, and collaboration with appropriate care team members and the patient's provider was performed as part of comprehensive patient evaluation and provision of chronic care management services.    SDOH (Social Determinants of Health) assessments performed: No;risk for tobacco use; risk for depression; risk for transport needs;risk for stress    Chronic Care Management from 02/21/2019 in [place]  PHQ-9 Total Score 9       GAD 7 : Generalized Anxiety Score 04/10/2019 05/01/2016 03/31/2016  Nervous, Anxious, on Edge 0 1 3  Control/stop worrying 0 1 3  Worry too much - different things 0 1 3  Trouble relaxing 0 0 2  Restless 0 0 2  Easily annoyed or irritable 0 0 0  Afraid - awful might happen 0 0 0  Total GAD 7 Score 0 3 13  Anxiety Difficulty Not difficult at all Not difficult at all Not difficult at all    Outpatient Encounter Medications as of 10/16/2019  Medication Sig  . acetaminophen (TYLENOL) 325 MG tablet Take 2 tablets (650 mg total) by mouth every 6 (six) hours as needed for mild pain or headache.  . albuterol (PROAIR HFA) 108 (90 Base) MCG/ACT inhaler 1-2 puffs every 6 hours as needed wheezing or shortness of breath.  [name] aspirin EC 81 MG tablet Take 1 tablet (81 mg total) by mouth daily.  [name] atorvastatin (LIPITOR) 40 MG tablet TAKE 1 TABLET ONCE DAILY AT 6PM  . buPROPion (WELLBUTRIN SR) 150 MG 12 hr tablet Take 1 tablet (150 mg total) by mouth daily with breakfast.  . butalbital-acetaminophen-caffeine (ESGIC) 50-325-40 MG tablet Take 1 tablet by mouth every 6 (six) hours as  needed for headache.  . celecoxib (CELEBREX) 200 MG capsule Take 1 capsule (200 mg total) by mouth daily as needed.  . cetaphil (CETAPHIL) lotion Apply 1 application topically 2 (two) times daily. For rash. Apply after washing with [facility] shower gel and rinsing with clear warm water.  . DULoxetine (CYMBALTA) 60 MG capsule Take 1 capsule (60 mg total) by mouth daily. WITH A FULL STOMACH AT SUPPER TIME  . fexofenadine (ALLEGRA) 180 MG tablet Take 1 tablet (180 mg total) by mouth daily. For allergy symptoms  . fluocinonide-emollient (LIDEX-E) 0.05 % cream Apply 1 application topically 2 (two) times daily. To affected areas  . fluticasone (FLONASE) 50 MCG/ACT nasal spray Place 2 sprays into both nostrils daily as needed for allergies or rhinitis.  . fluticasone furoate-vilanterol (BREO ELLIPTA) 100-25 MCG/INH AEPB Inhale 1 puff into the lungs daily.  . Ipratropium-Albuterol (COMBIVENT RESPIMAT) 20-100 MCG/ACT AERS respimat Inhale 1 puff into the lungs every 6 (six) hours  . isosorbide mononitrate (IMDUR) 60 MG 24 hr tablet Take 1 tablet (60 mg total) by mouth daily.  . metoprolol tartrate (LOPRESSOR) 25 MG tablet Take 1 tablet (25 mg total) by mouth 2 (two) times daily.  . mirtazapine (REMERON) 15 MG tablet Take 1 tablet (15 mg total) by mouth at bedtime. For sleep  . nitroGLYCERIN (NITROSTAT) 0.4 MG SL tablet Place 1 tablet (0.4 mg total) under the tongue every 5 (five) minutes x  3 doses as needed for chest pain.  . pantoprazole (PROTONIX) 40 MG tablet TAKE  (1)  TABLET TWICE A DAY.  . tamsulosin (FLOMAX) 0.4 MG CAPS capsule Take 2 capsules (0.8 mg total) by mouth daily after supper.   No facility-administered encounter medications on file as of 10/16/2019.    Goals Addressed               . "I need a ride to get my B12 shot" (pt-stated)        Current Barriers:   Film/video editor.   Literacy barriers  Transportation barriers in client with Chronic Diagnoses of DDD, Depressin  Apraxia of Speech, CAD GAD, Hx MI, HTN, Infantile Cerebral Palsy   Cognitive Deficits  Nurse Case Manager Clinical Goal(s):   Over the next 7 days, patient will have transportation arranged for B12 injection that is scheduled for 11/01/2019 at 10:30 AM at [facility]   Interventions:   [name] talked with client about his upcoming appointment at [facility] on 11/01/2019 for B12 injection  [name] talked with client about transport arrangements for 11/01/2019 appointment for client  Talked with client about pain issues (he said he hurt one of his small toes recently and nail on toe came off)  Patient Self Care Activities:   Currently UNABLE TO independently drive to medical appointments.   Please see past updates related to this goal by clicking on the "Past Updates" button in the selected goal       Follow Up Plan: [name] to call client in next 4 weeks to talk with client about social work needs of client at that time  Matthieu Loftus.Desare Duddy MSW, [name] Licensed Clinical Social Worker [facility] Care Management [phone]

## 2019-10-17 ENCOUNTER — Ambulatory Visit: Payer: Self-pay | Admitting: Licensed Clinical Social Worker

## 2019-10-17 DIAGNOSIS — I252 Old myocardial infarction: Secondary | ICD-10-CM

## 2019-10-17 DIAGNOSIS — R482 Apraxia: Secondary | ICD-10-CM

## 2019-10-17 DIAGNOSIS — I10 Essential (primary) hypertension: Secondary | ICD-10-CM

## 2019-10-17 DIAGNOSIS — K219 Gastro-esophageal reflux disease without esophagitis: Secondary | ICD-10-CM

## 2019-10-17 DIAGNOSIS — M5136 Other intervertebral disc degeneration, lumbar region: Secondary | ICD-10-CM

## 2019-10-17 DIAGNOSIS — M51369 Other intervertebral disc degeneration, lumbar region without mention of lumbar back pain or lower extremity pain: Secondary | ICD-10-CM

## 2019-10-17 DIAGNOSIS — G809 Cerebral palsy, unspecified: Secondary | ICD-10-CM

## 2019-10-17 DIAGNOSIS — F411 Generalized anxiety disorder: Secondary | ICD-10-CM

## 2019-10-17 DIAGNOSIS — E538 Deficiency of other specified B group vitamins: Secondary | ICD-10-CM

## 2019-10-17 DIAGNOSIS — J441 Chronic obstructive pulmonary disease with (acute) exacerbation: Secondary | ICD-10-CM

## 2019-10-17 DIAGNOSIS — F32A Depression, unspecified: Secondary | ICD-10-CM

## 2019-10-17 DIAGNOSIS — I25118 Atherosclerotic heart disease of native coronary artery with other forms of angina pectoris: Secondary | ICD-10-CM

## 2019-10-17 NOTE — Chronic Care Management (AMB) (Signed)
Chronic Care Management    Clinical Social Work Follow Up Note  10/17/2019 Name: [name] MRN: [id number] DOB: 07/18/1955  [name] is a 64 y.o. year old male who is a primary care patient of [name], [name], MD. The CCM team was consulted for assistance with [name] .   Review of patient status, including review of consultants reports, other relevant assessments, and collaboration with appropriate care team members and the patient's provider was performed as part of comprehensive patient evaluation and provision of chronic care management services.    SDOH (Social Determinants of Health) assessments performed: No;risk for tobacco use; risk for transport needs; risk for depression; risk for social isolation    Chronic Care Management from 02/21/2019 in [place]  PHQ-9 Total Score 9       GAD 7 : Generalized Anxiety Score 04/10/2019 05/01/2016 03/31/2016  Nervous, Anxious, on Edge 0 1 3  Control/stop worrying 0 1 3  Worry too much - different things 0 1 3  Trouble relaxing 0 0 2  Restless 0 0 2  Easily annoyed or irritable 0 0 0  Afraid - awful might happen 0 0 0  Total GAD 7 Score 0 3 13  Anxiety Difficulty Not difficult at all Not difficult at all Not difficult at all    Outpatient Encounter Medications as of 10/17/2019  Medication Sig  . acetaminophen (TYLENOL) 325 MG tablet Take 2 tablets (650 mg total) by mouth every 6 (six) hours as needed for mild pain or headache.  . albuterol (PROAIR HFA) 108 (90 Base) MCG/ACT inhaler 1-2 puffs every 6 hours as needed wheezing or shortness of breath.  [name] aspirin EC 81 MG tablet Take 1 tablet (81 mg total) by mouth daily.  [name] atorvastatin (LIPITOR) 40 MG tablet TAKE 1 TABLET ONCE DAILY AT 6PM  . buPROPion (WELLBUTRIN SR) 150 MG 12 hr tablet Take 1 tablet (150 mg total) by mouth daily with breakfast.  . butalbital-acetaminophen-caffeine (ESGIC) 50-325-40 MG tablet Take 1 tablet by mouth every 6  (six) hours as needed for headache.  . celecoxib (CELEBREX) 200 MG capsule Take 1 capsule (200 mg total) by mouth daily as needed.  . cetaphil (CETAPHIL) lotion Apply 1 application topically 2 (two) times daily. For rash. Apply after washing with [facility] shower gel and rinsing with clear warm water.  . DULoxetine (CYMBALTA) 60 MG capsule Take 1 capsule (60 mg total) by mouth daily. WITH A FULL STOMACH AT SUPPER TIME  . fexofenadine (ALLEGRA) 180 MG tablet Take 1 tablet (180 mg total) by mouth daily. For allergy symptoms  . fluocinonide-emollient (LIDEX-E) 0.05 % cream Apply 1 application topically 2 (two) times daily. To affected areas  . fluticasone (FLONASE) 50 MCG/ACT nasal spray Place 2 sprays into both nostrils daily as needed for allergies or rhinitis.  . fluticasone furoate-vilanterol (BREO ELLIPTA) 100-25 MCG/INH AEPB Inhale 1 puff into the lungs daily.  . Ipratropium-Albuterol (COMBIVENT RESPIMAT) 20-100 MCG/ACT AERS respimat Inhale 1 puff into the lungs every 6 (six) hours  . isosorbide mononitrate (IMDUR) 60 MG 24 hr tablet Take 1 tablet (60 mg total) by mouth daily.  . metoprolol tartrate (LOPRESSOR) 25 MG tablet Take 1 tablet (25 mg total) by mouth 2 (two) times daily.  . mirtazapine (REMERON) 15 MG tablet Take 1 tablet (15 mg total) by mouth at bedtime. For sleep  . nitroGLYCERIN (NITROSTAT) 0.4 MG SL tablet Place 1 tablet (0.4 mg total) under the tongue every 5 (five)  minutes x 3 doses as needed for chest pain.  . pantoprazole (PROTONIX) 40 MG tablet TAKE  (1)  TABLET TWICE A DAY.  . tamsulosin (FLOMAX) 0.4 MG CAPS capsule Take 2 capsules (0.8 mg total) by mouth daily after supper.   No facility-administered encounter medications on file as of 10/17/2019.     Goals Addressed              This Visit's Progress   .   "I need a ride to get my B12 shot" (pt-stated)        Current Barriers:  . Film/video editor.  . Literacy barriers . Transportation barriers in client  with Chronic Diagnoses of DDD, Depressin Apraxia of Speech, CAD GAD, Hx MI, HTN, Infantile Cerebral Palsy  . Cognitive Deficits  Nurse Case Manager Clinical Goal(s):  [name] Over the next 7 days, patient will have transportation arranged for B12 injection that is scheduled for [facility]at [facility] .  Interventions:  . [name] contacted [name] representative today and scheduled client transport help for 11/01/2019 with [name] . [name] communicated transport arrangements information with client today   Patient Self Care Activities:  . Currently UNABLE TO independently drive to medical appointments.   Please see past updates related to this goal by clicking on the "Past Updates" button in the selected goal         Follow Up Plan: [name] to call client in next 4 weeks to talk with client about social work needs of client at that time  Yolanda Dockendorf.Dandre Sisler MSW, [name] Licensed Clinical Social Worker [facility] Care Management [phone]

## 2019-10-17 NOTE — Patient Instructions (Addendum)
Licensed Clinical Social Worker Visit Information  Goals we discussed today:  Goals Addressed              This Visit's Progress      "I need a ride to get my B12 shot" (pt-stated)        Current Barriers:   Film/video editor.   Literacy barriers  Transportation barriers in client with Chronic Diagnoses of DDD, Depressin Apraxia of Speech, CAD GAD, Hx MI, HTN, Infantile Cerebral Palsy   Cognitive Deficits  Nurse Case Manager Clinical Goal(s):   Over the next 7 days, patient will have transportation arranged for B12 injection that is scheduled for 07/14/2021at [facility]   Interventions:   [name] contacted [name] representative today and scheduled client transport help for 11/01/2019 with [name]  [name] communicated transport arrangements information with client today   Patient Self Care Activities:   Currently UNABLE TO independently drive to medical appointments.   Please see past updates related to this goal by clicking on the "Past Updates" button in the selected goal         Materials Provided: No  Follow Up Plan:[name] to call client in next 4 weeks to talk with client about social work needs of client at that time  The patient verbalized understanding of instructions provided today and declined a print copy of patient instruction materials.   [name].Latecia Miler MSW, [name] Licensed Clinical Social Worker [facility] Care Management [phone]

## 2019-10-20 ENCOUNTER — Ambulatory Visit (INDEPENDENT_AMBULATORY_CARE_PROVIDER_SITE_OTHER): Payer: Medicare Other | Admitting: *Deleted

## 2019-10-20 DIAGNOSIS — I10 Essential (primary) hypertension: Secondary | ICD-10-CM

## 2019-10-20 DIAGNOSIS — G809 Cerebral palsy, unspecified: Secondary | ICD-10-CM

## 2019-10-20 DIAGNOSIS — J441 Chronic obstructive pulmonary disease with (acute) exacerbation: Secondary | ICD-10-CM

## 2019-10-20 NOTE — Patient Instructions (Signed)
Visit Information  Goals Addressed              This Visit's Progress     Patient Stated   .  COMPLETED: "I need transportation to come in and get my B12 shot" (pt-stated)        Transportation needs in patient with infantile cerebral palsy, hypertension, and hx of MI  Current Barriers:  . Film/video editor.  . Literacy barriers . Transportation barriers in patient with Chronic Diagnoses of HTN, GAD, Hx of MI, CAD, Apraxia of Speech, DDD, Depression  Nurse Case Manager Clinical Goal(s):  [name] Over the next 10 days, patient will have transportation to office visit for B12 injection  Interventions:  . Reviewed appointment information with patient . Previously collaborated with [employer], [name] . Chart reviewed . Advised patient that transportation has been arranged for his 10:00 B12 appt on 10/02/19  Patient Self Care Activities:  . Performs most ADLs but does have in home Aide and has help form his sisters for most all IADLs   Please see past updates related to this goal by clicking on the "Past Updates" button in the selected goal      .  "I want to find out why I'm falling" (pt-stated)        [place] (see longitudinal plan of care for additional care plan information)  Current Barriers:  . Care Coordination needs related to new onset of recurrent falls in a patient with infantile cerebral palsy, HTN, and COPD (disease states) . Cognitive Deficits  Nurse Case Manager Clinical Goal(s):  [name] Over the next 30 days, patient will have an in person office visit with PCP to review recent falls . Over the next 30 days, patient will reach out to [facility] or PCP with any new or worsening symptoms . Over the next 30 days, patient will demonstrate understanding of fall prevention strategies as evidenced by a decrease in falls  Interventions:  . Inter-disciplinary care team collaboration (see longitudinal plan of care) . Chart reviewed including recent office notes and lab  results . Medications reviewed with patient . Discussed falls o Per patient, his legs just feel weak and he falls.  o Denies any lightheadedness or dizziness o No injuries with falls o Recovers after sitting for a few minutes . Discussed possible causes for falls . Encouraged patient to change positions slowly and to stand for 30 seconds or so after arising from a seated position before he starts to walk . Reviewed upcoming appts o Dr Livia Snellen 11/15/2019 o Appt note added to discuss falls . Encouraged patient to reach out to PCP or RNCM with any new symptoms and to call if he experiences any additional falls . Encouraged patient to keep cell phone with him at all times  Patient Self Care Activities:  . Performs ADL's independently . Does not drive and has assistance from sister and [name] . Takes prepackaged medications as directed . Calls PCP with any medical concerns  Initial goal documentation     .  COMPLETED: "I want to keep my medications straight" (pt-stated)        Current Barriers:  [name] Knowledge Deficits related to medication management . Lacks caregiver support.  . Film/video editor.  . Literacy barriers . Transportation barriers . Cognitive Deficits  Nurse Case Manager Clinical Goal(s):  [name] Over the next 90 days, patient will reach out to PCP, pharmacist, or RN CCM with any questions about his medications  Interventions:   Discussed  current medications and most recent changes with patient  Verified that he is still receiving deliveries from [facility] and this his medications are prepackaged  Reviewed last office note and medication list  Reviewed medications with patient  Discussed recent medication changes  Patient Self Care Activities:  . Currently UNABLE TO independently drive. . Attends all scheduled provider appointments with arranged transportation.   Please see past updates related to this goal by clicking on the "Past Updates" button in the  selected goal       .  "I want to make sure my blood pressure stays under control" (pt-stated)   On track     Current Barriers:  [name] Knowledge Deficits related to blood pressure readings and management . Lacks caregiver support.   . Cognitive deficits . illiterate  Nurse Case Manager Clinical Goal(s):   Over the next 30 days, patient will continue to take medications as prescribed  Over the next 30 days, patient will continue to receive prepackaged weekly medications from [facility]  Over the next 30 days, Patient will check and record his blood pressure daily  Over the next 30 days, patient will work with Consulting civil engineer and PCP top maintain BP within provider recommended parameters  Interventions:   Reviewed chart and previous notes  Discussed home blood pressure readings with patient. He did not have those readily available during call but reports that they have been normal  Asked patient to continue checking and recording blood pressure daily.  Asked patient to bring readings with him to next PCP appt  Asked patient to call our office at [phone] to report any readings outside of the provider recommended ranges  Asked patient to call our office at [phone] with any new or worsening symptoms  Reviewed upcoming appts  Dr Livia Snellen 11/15/19  Patient Self Care Activities:   . He is able to take his prepackaged medications on his own . Prepares his own meals . Does not drive but has assistance from sister and [name]  Please see past updates related to this goal by clicking on the "Past Updates" button in the selected goal         Other   .  "I need transportation setup for my appointment"   On track     [name] (see longitudinal plan of care for additional care plan information)  Current Barriers:  . Literacy barriers . Transportation barriers . Cognitive Deficits  Nurse Case Manager Clinical Goal(s):  [name] Over the next 7 days, patient will have  transportation arranged for doctor's visit  Interventions:  . Inter-disciplinary care team collaboration (see longitudinal plan of care) . Chart reviewed . Talked with patient by telephone . Discussed upcoming appointment with Dr Livia Snellen on 08/16/19 at 8:55 . Reached out to [name] at [phone] and arranged transport . Patient notified that [name] will pick him up at 8:25 on 08/16/19 . Collaborated with [name]  Patient Self Care Activities:  . Performs ADL's independently . Unable to independently drive  Initial goal documentation        Patient verbalizes understanding of instructions provided today.   Follow-up Plan The care management team will reach out to the patient again over the next 30 days.  Next PCP appointment scheduled for: 11/15/19 Dr Livia Snellen  [name], BSN, RN-BC [place] / [place] Management Direct Dial: [phone]

## 2019-10-20 NOTE — Chronic Care Management (AMB) (Signed)
Chronic Care Management   Follow Up Note   10/20/2019 Name: [name] MRN: [id number] DOB: [birth year]  Referred by: [name], MD Reason for referral : Chronic Care Management (RN follow up)   [name] is a 64 y.o. year old male who is a primary care patient of [name], [name], MD. The CCM team was consulted for assistance with chronic disease management and care coordination needs.    Review of patient status, including review of consultants reports, relevant laboratory and other test results, and collaboration with appropriate care team members and the patient's provider was performed as part of comprehensive patient evaluation and provision of chronic care management services.    I talked with [name] by telephone today regarding management of his chronic medical conditions.   SDOH (Social Determinants of Health) assessments performed: Yes-transportation See Care Plan activities for detailed interventions related to [facility])     Outpatient Encounter Medications as of 10/20/2019  Medication Sig  . acetaminophen (TYLENOL) 325 MG tablet Take 2 tablets (650 mg total) by mouth every 6 (six) hours as needed for mild pain or headache.  . albuterol (PROAIR HFA) 108 (90 Base) MCG/ACT inhaler 1-2 puffs every 6 hours as needed wheezing or shortness of breath.  [name] aspirin EC 81 MG tablet Take 1 tablet (81 mg total) by mouth daily.  [name] atorvastatin (LIPITOR) 40 MG tablet TAKE 1 TABLET ONCE DAILY AT 6PM  . buPROPion (WELLBUTRIN SR) 150 MG 12 hr tablet Take 1 tablet (150 mg total) by mouth daily with breakfast.  . butalbital-acetaminophen-caffeine (ESGIC) 50-325-40 MG tablet Take 1 tablet by mouth every 6 (six) hours as needed for headache.  . celecoxib (CELEBREX) 200 MG capsule Take 1 capsule (200 mg total) by mouth daily as needed.  . cetaphil (CETAPHIL) lotion Apply 1 application topically 2 (two) times daily. For rash. Apply after washing with [facility] shower gel and rinsing with  clear warm water.  . DULoxetine (CYMBALTA) 60 MG capsule Take 1 capsule (60 mg total) by mouth daily. WITH A FULL STOMACH AT SUPPER TIME  . fexofenadine (ALLEGRA) 180 MG tablet Take 1 tablet (180 mg total) by mouth daily. For allergy symptoms  . fluocinonide-emollient (LIDEX-E) 0.05 % cream Apply 1 application topically 2 (two) times daily. To affected areas  . fluticasone (FLONASE) 50 MCG/ACT nasal spray Place 2 sprays into both nostrils daily as needed for allergies or rhinitis.  . fluticasone furoate-vilanterol (BREO ELLIPTA) 100-25 MCG/INH AEPB Inhale 1 puff into the lungs daily.  . Ipratropium-Albuterol (COMBIVENT RESPIMAT) 20-100 MCG/ACT AERS respimat Inhale 1 puff into the lungs every 6 (six) hours  . isosorbide mononitrate (IMDUR) 60 MG 24 hr tablet Take 1 tablet (60 mg total) by mouth daily.  . metoprolol tartrate (LOPRESSOR) 25 MG tablet Take 1 tablet (25 mg total) by mouth 2 (two) times daily.  . mirtazapine (REMERON) 15 MG tablet Take 1 tablet (15 mg total) by mouth at bedtime. For sleep  . nitroGLYCERIN (NITROSTAT) 0.4 MG SL tablet Place 1 tablet (0.4 mg total) under the tongue every 5 (five) minutes x 3 doses as needed for chest pain.  . pantoprazole (PROTONIX) 40 MG tablet TAKE  (1)  TABLET TWICE A DAY.  . tamsulosin (FLOMAX) 0.4 MG CAPS capsule Take 2 capsules (0.8 mg total) by mouth daily after supper.   No facility-administered encounter medications on file as of 10/20/2019.     Fall Risk  10/20/2019 08/23/2019 08/16/2019 06/27/2019 05/16/2019  Falls in the past year?  1 0 0 0 0  Number falls in past yr: 1 0 - 0 0  Injury with Fall? 0 0 - 0 0  Comment - - - - -  Risk for fall due to : Impaired balance/gait;History of fall(s) Impaired balance/gait Impaired balance/gait Impaired balance/gait Impaired balance/gait;History of fall(s)  Follow up Falls evaluation completed;Falls prevention discussed;Follow up appointment Falls evaluation completed Falls evaluation completed Falls evaluation  completed Falls evaluation completed     RN Care Manager   .  "I want to find out why I'm falling" (pt-stated)        CARE PLAN ENTRY (see longitudinal plan of care for additional care plan information)  Current Barriers:  . Care Coordination needs related to new onset of recurrent falls in a patient with infantile cerebral palsy, HTN, and COPD (disease states) . Cognitive Deficits  Nurse Case Manager Clinical Goal(s):  [name] Over the next 30 days, patient will have an in person office visit with PCP to review recent falls . Over the next 30 days, patient will reach out to [facility] or PCP with any new or worsening symptoms . Over the next 30 days, patient will demonstrate understanding of fall prevention strategies as evidenced by a decrease in falls  Interventions:  . Inter-disciplinary care team collaboration (see longitudinal plan of care) . Chart reviewed including recent office notes and lab results . Medications reviewed with patient . Discussed falls o Per patient, his legs just feel weak and he falls.  o Denies any lightheadedness or dizziness o No injuries with falls o Recovers after sitting for a few minutes . Discussed possible causes for falls . Encouraged patient to change positions slowly and to stand for 30 seconds or so after arising from a seated position before he starts to walk . Reviewed upcoming appts o Dr Livia Snellen 11/15/2019 o Appt note added to discuss falls . Encouraged patient to reach out to PCP or RNCM with any new symptoms and to call if he experiences any additional falls . Encouraged patient to keep cell phone with him at all times  Patient Self Care Activities:  . Performs ADL's independently . Does not drive and has assistance from sister and [name] . Takes prepackaged medications as directed . Calls PCP with any medical concerns  Initial goal documentation     .  "I want to make sure my blood pressure stays under control" (pt-stated)   On track      Current Barriers:  [name] Knowledge Deficits related to blood pressure readings and management . Lacks caregiver support.   . Cognitive deficits . illiterate  Nurse Case Manager Clinical Goal(s):   Over the next 30 days, patient will continue to take medications as prescribed  Over the next 30 days, patient will continue to receive prepackaged weekly medications from [facility]  Over the next 30 days, Patient will check and record his blood pressure daily  Over the next 30 days, patient will work with Consulting civil engineer and PCP top maintain BP within provider recommended parameters  Interventions:   Reviewed chart and previous notes  Discussed home blood pressure readings with patient. He did not have those readily available during call but reports that they have been normal  Asked patient to continue checking and recording blood pressure daily.  Asked patient to bring readings with him to next PCP appt  Asked patient to call our office at [phone] to report any readings outside of the provider recommended ranges  Asked patient  to call our office at [phone] with any new or worsening symptoms  Reviewed upcoming appts  Dr Livia Snellen 11/15/19  Patient Self Care Activities:   . He is able to take his prepackaged medications on his own . Prepares his own meals . Does not drive but has assistance from sister and [name]  Please see past updates related to this goal by clicking on the "Past Updates" button in the selected goal       .  "I need transportation setup for my appointment"   On track     [place] (see longitudinal plan of care for additional care plan information)  Current Barriers:  . Literacy barriers . Transportation barriers . Cognitive Deficits  Nurse Case Manager Clinical Goal(s):  [name] Over the next 7 days, patient will have transportation arranged for doctor's visit  Interventions:  . Inter-disciplinary care team collaboration (see longitudinal  plan of care) . Chart reviewed . Talked with patient by telephone . Discussed upcoming appointment with Dr Livia Snellen on 08/16/19 at 8:55 . Reached out to [name] at [phone] and arranged transport . Patient notified that [name] will pick him up at 8:25 on 08/16/19 . Collaborated with [name]  Patient Self Care Activities:  . Performs ADL's independently . Unable to independently drive  Initial goal documentation         Plan:  The care management team will reach out to the patient again over the next 30 days.  Next PCP appointment scheduled for: 11/15/19 Dr Livia Snellen   [name], BSN, RN-BC [place] [facility] [place] Management Direct Dial: [phone]

## 2019-11-01 ENCOUNTER — Ambulatory Visit (INDEPENDENT_AMBULATORY_CARE_PROVIDER_SITE_OTHER): Payer: Medicare Other | Admitting: *Deleted

## 2019-11-01 ENCOUNTER — Other Ambulatory Visit: Payer: Self-pay

## 2019-11-01 DIAGNOSIS — E538 Deficiency of other specified B group vitamins: Secondary | ICD-10-CM | POA: Diagnosis not present

## 2019-11-01 MED ORDER — CYANOCOBALAMIN 1000 MCG/ML IJ SOLN
1000.0000 ug | Freq: Once | INTRAMUSCULAR | Status: AC
  Administered 2019-11-01: 1000 ug via INTRAMUSCULAR

## 2019-11-01 NOTE — Progress Notes (Signed)
Patient in today for B 12 injection. 1000 mcg given IM in right deltoid. Patient tolerated well.  

## 2019-11-03 ENCOUNTER — Telehealth: Payer: Self-pay | Admitting: Family Medicine

## 2019-11-03 NOTE — Telephone Encounter (Signed)
Patient has a knot that has appeared on his chin, needs to be seen.  An appointment was scheduled 11/08/2019 at 10:10 am with Dr. Livia Snellen, patient aware.

## 2019-11-06 ENCOUNTER — Ambulatory Visit: Payer: Self-pay | Admitting: Licensed Clinical Social Worker

## 2019-11-06 DIAGNOSIS — M5136 Other intervertebral disc degeneration, lumbar region: Secondary | ICD-10-CM

## 2019-11-06 DIAGNOSIS — J441 Chronic obstructive pulmonary disease with (acute) exacerbation: Secondary | ICD-10-CM

## 2019-11-06 DIAGNOSIS — I252 Old myocardial infarction: Secondary | ICD-10-CM

## 2019-11-06 DIAGNOSIS — I10 Essential (primary) hypertension: Secondary | ICD-10-CM

## 2019-11-06 DIAGNOSIS — I25118 Atherosclerotic heart disease of native coronary artery with other forms of angina pectoris: Secondary | ICD-10-CM

## 2019-11-06 DIAGNOSIS — K219 Gastro-esophageal reflux disease without esophagitis: Secondary | ICD-10-CM

## 2019-11-06 DIAGNOSIS — F329 Major depressive disorder, single episode, unspecified: Secondary | ICD-10-CM

## 2019-11-06 DIAGNOSIS — E538 Deficiency of other specified B group vitamins: Secondary | ICD-10-CM

## 2019-11-06 DIAGNOSIS — F411 Generalized anxiety disorder: Secondary | ICD-10-CM

## 2019-11-06 DIAGNOSIS — G809 Cerebral palsy, unspecified: Secondary | ICD-10-CM

## 2019-11-06 DIAGNOSIS — R482 Apraxia: Secondary | ICD-10-CM

## 2019-11-06 DIAGNOSIS — F32A Depression, unspecified: Secondary | ICD-10-CM

## 2019-11-06 NOTE — Patient Instructions (Addendum)
Licensed Clinical Education officer, museum Visit Information  Goals we discussed today:    "I need transportation setup for my appointment"          CARE PLAN ENTRY  Current Barriers:   Literacy barriers  Transportation barriers  Cognitive Deficits  Nurse Case Manager Clinical Goal(s):   Over the next 7 days, patient will have transportation arranged for doctor's visit  Interventions:   Called patient and discussed upcoming appointment of client with Dr Livia Snellen .on 11/15/19 at 9:55 AM  Reached out to [name] at [phone] and arranged transport for client for appointment with Dr. Livia Snellen  Patient notified that [name] will transport him to and from his next appointemnt with Dr. Livia Snellen        on 11/15/19 at 9:55 AM   Patient Self Care Activities:   Performs ADL's independently  Unable to independently drive  Initial goal documentation    Follow Up Plan: [name] to call client in next 4 weeks to talk with client about social work needs of client at that time  Materials Provided: No  The patient verbalized understanding of instructions provided today and declined a print copy of patient instruction materials.   [name].Antwone Capozzoli MSW, [name] Licensed Clinical Social Worker [facility] Care Management [phone]

## 2019-11-06 NOTE — Chronic Care Management (AMB) (Signed)
Chronic Care Management    Clinical Social Work Follow Up Note  11/06/2019 Name: [name] MRN: [id number] DOB: Jul 20, 1955  [name] is a 64 y.o. year old male who is a primary care patient of [name], [name], MD. The CCM team was consulted for assistance with [name] .   Review of patient status, including review of consultants reports, other relevant assessments, and collaboration with appropriate care team members and the patient's provider was performed as part of comprehensive patient evaluation and provision of chronic care management services.    SDOH (Social Determinants of Health) assessments performed: No;risk for stress; risk for depression; risk for tobacco use; risk for transport needs    Chronic Care Management from 02/21/2019 in [place]  PHQ-9 Total Score 9     GAD 7 : Generalized Anxiety Score 04/10/2019 05/01/2016 03/31/2016  Nervous, Anxious, on Edge 0 1 3  Control/stop worrying 0 1 3  Worry too much - different things 0 1 3  Trouble relaxing 0 0 2  Restless 0 0 2  Easily annoyed or irritable 0 0 0  Afraid - awful might happen 0 0 0  Total GAD 7 Score 0 3 13  Anxiety Difficulty Not difficult at all Not difficult at all Not difficult at all    Outpatient Encounter Medications as of 11/06/2019  Medication Sig   acetaminophen (TYLENOL) 325 MG tablet Take 2 tablets (650 mg total) by mouth every 6 (six) hours as needed for mild pain or headache.   albuterol (PROAIR HFA) 108 (90 Base) MCG/ACT inhaler 1-2 puffs every 6 hours as needed wheezing or shortness of breath.   aspirin EC 81 MG tablet Take 1 tablet (81 mg total) by mouth daily.   atorvastatin (LIPITOR) 40 MG tablet TAKE 1 TABLET ONCE DAILY AT 6PM   buPROPion (WELLBUTRIN SR) 150 MG 12 hr tablet Take 1 tablet (150 mg total) by mouth daily with breakfast.   butalbital-acetaminophen-caffeine (ESGIC) 50-325-40 MG tablet Take 1 tablet by mouth every 6 (six) hours as  needed for headache.   celecoxib (CELEBREX) 200 MG capsule Take 1 capsule (200 mg total) by mouth daily as needed.   cetaphil (CETAPHIL) lotion Apply 1 application topically 2 (two) times daily. For rash. Apply after washing with [facility] shower gel and rinsing with clear warm water.   DULoxetine (CYMBALTA) 60 MG capsule Take 1 capsule (60 mg total) by mouth daily. WITH A FULL STOMACH AT SUPPER TIME   fexofenadine (ALLEGRA) 180 MG tablet Take 1 tablet (180 mg total) by mouth daily. For allergy symptoms   fluocinonide-emollient (LIDEX-E) 0.05 % cream Apply 1 application topically 2 (two) times daily. To affected areas   fluticasone (FLONASE) 50 MCG/ACT nasal spray Place 2 sprays into both nostrils daily as needed for allergies or rhinitis.   fluticasone furoate-vilanterol (BREO ELLIPTA) 100-25 MCG/INH AEPB Inhale 1 puff into the lungs daily.   Ipratropium-Albuterol (COMBIVENT RESPIMAT) 20-100 MCG/ACT AERS respimat Inhale 1 puff into the lungs every 6 (six) hours   isosorbide mononitrate (IMDUR) 60 MG 24 hr tablet Take 1 tablet (60 mg total) by mouth daily.   metoprolol tartrate (LOPRESSOR) 25 MG tablet Take 1 tablet (25 mg total) by mouth 2 (two) times daily.   mirtazapine (REMERON) 15 MG tablet Take 1 tablet (15 mg total) by mouth at bedtime. For sleep   nitroGLYCERIN (NITROSTAT) 0.4 MG SL tablet Place 1 tablet (0.4 mg total) under the tongue every 5 (five) minutes x 3  doses as needed for chest pain.   pantoprazole (PROTONIX) 40 MG tablet TAKE  (1)  TABLET TWICE A DAY.   tamsulosin (FLOMAX) 0.4 MG CAPS capsule Take 2 capsules (0.8 mg total) by mouth daily after supper.   No facility-administered encounter medications on file as of 11/06/2019.     Goals      "I need transportation setup for my appointment"      CARE PLAN ENTRY  Current Barriers:   Literacy barriers  Transportation barriers  Cognitive Deficits  Nurse Case Manager Clinical Goal(s):   Over the next 7  days, patient will have transportation arranged for doctor's visit  Interventions:   Called patient and discussed upcoming appointment of client with Dr Livia Snellen .on 11/15/19 at 9:55 AM  Reached out to [name] at [phone] and arranged transport for client for appointment with Dr. Livia Snellen  Patient notified that [name] will transport him to and from his next appointemnt with Dr. Livia Snellen        on 11/15/19 at 9:55 AM   Patient Self Care Activities:   Performs ADL's independently  Unable to independently drive  Initial goal documentation    Follow Up Plan: [name] to call client in next 4 weeks to talk with client about social work needs of client at that time  Josemiguel Gries.Keonda Dow MSW, [name] Licensed Clinical Social Worker [facility] [phone]

## 2019-11-08 ENCOUNTER — Other Ambulatory Visit: Payer: Self-pay

## 2019-11-08 ENCOUNTER — Ambulatory Visit (INDEPENDENT_AMBULATORY_CARE_PROVIDER_SITE_OTHER): Payer: Medicare Other | Admitting: Family Medicine

## 2019-11-08 ENCOUNTER — Encounter: Payer: Self-pay | Admitting: Family Medicine

## 2019-11-08 VITALS — BP 123/67 | HR 65 | Temp 97.5°F | Resp 20 | Ht 73.0 in | Wt 246.1 lb

## 2019-11-08 DIAGNOSIS — L731 Pseudofolliculitis barbae: Secondary | ICD-10-CM | POA: Diagnosis not present

## 2019-11-12 ENCOUNTER — Encounter: Payer: Self-pay | Admitting: Family Medicine

## 2019-11-12 NOTE — Progress Notes (Signed)
No chief complaint on file.   HPI  Patient presents today for a knot on his chin. It is rather painful. Its been present for several days.  PMH: Smoking status noted ROS: Per HPI  Objective: BP 123/67   Pulse 65   Temp (!) 97.5 F (36.4 C) (Temporal)   Resp 20   Ht 6\' 1"  (1.854 m)   Wt 246 lb 2 oz (111.6 kg)   SpO2 98%   BMI 32.47 kg/m  Gen: NAD, alert, cooperative with exam HEENT: NCAT, EOMI, PERRL CV: RRR, good S1/S2, no murmur Resp: CTABL, no wheezes, non-labored Skin: There is a erythematous lesion measuring 8 mm at the right corner of the chin in the beard area.  Ext: No edema, warm Neuro: Alert and oriented, No gross deficits  Assessment and plan:  1. Ingrown hair     Warm soaks should be useful. Apply Neosporin 2-4 times daily. Follow-up should it become more painful red or swollen.    Follow up as needed.  [name], MD

## 2019-11-15 ENCOUNTER — Ambulatory Visit: Payer: Medicare Other | Admitting: Family Medicine

## 2019-11-27 ENCOUNTER — Ambulatory Visit (INDEPENDENT_AMBULATORY_CARE_PROVIDER_SITE_OTHER): Payer: Medicare Other | Admitting: Licensed Clinical Social Worker

## 2019-11-27 DIAGNOSIS — I25118 Atherosclerotic heart disease of native coronary artery with other forms of angina pectoris: Secondary | ICD-10-CM | POA: Diagnosis not present

## 2019-11-27 DIAGNOSIS — I252 Old myocardial infarction: Secondary | ICD-10-CM

## 2019-11-27 DIAGNOSIS — F411 Generalized anxiety disorder: Secondary | ICD-10-CM

## 2019-11-27 DIAGNOSIS — F329 Major depressive disorder, single episode, unspecified: Secondary | ICD-10-CM

## 2019-11-27 DIAGNOSIS — E538 Deficiency of other specified B group vitamins: Secondary | ICD-10-CM

## 2019-11-27 DIAGNOSIS — I10 Essential (primary) hypertension: Secondary | ICD-10-CM

## 2019-11-27 DIAGNOSIS — J441 Chronic obstructive pulmonary disease with (acute) exacerbation: Secondary | ICD-10-CM | POA: Diagnosis not present

## 2019-11-27 DIAGNOSIS — M5136 Other intervertebral disc degeneration, lumbar region: Secondary | ICD-10-CM

## 2019-11-27 DIAGNOSIS — K219 Gastro-esophageal reflux disease without esophagitis: Secondary | ICD-10-CM

## 2019-11-27 DIAGNOSIS — F32A Depression, unspecified: Secondary | ICD-10-CM

## 2019-11-27 DIAGNOSIS — R482 Apraxia: Secondary | ICD-10-CM

## 2019-11-27 DIAGNOSIS — G809 Cerebral palsy, unspecified: Secondary | ICD-10-CM

## 2019-11-27 NOTE — Chronic Care Management (AMB) (Signed)
Chronic Care Management    Clinical Social Work Follow Up Note  11/27/2019 Name: [name] MRN: [id number] DOB: 07-Feb-1956  [name] is a 64 y.o. year old male who is a primary care patient of [name], [name], MD. The CCM team was consulted for assistance with [name] .   Review of patient status, including review of consultants reports, other relevant assessments, and collaboration with appropriate care team members and the patient's provider was performed as part of comprehensive patient evaluation and provision of chronic care management services.    SDOH (Social Determinants of Health) assessments performed: No;risk for depression; risk for tobacco use; risk for stress; risk for transport needs    Chronic Care Management from 02/21/2019 in [place]  PHQ-9 Total Score 9       GAD 7 : Generalized Anxiety Score 04/10/2019 05/01/2016 03/31/2016  Nervous, Anxious, on Edge 0 1 3  Control/stop worrying 0 1 3  Worry too much - different things 0 1 3  Trouble relaxing 0 0 2  Restless 0 0 2  Easily annoyed or irritable 0 0 0  Afraid - awful might happen 0 0 0  Total GAD 7 Score 0 3 13  Anxiety Difficulty Not difficult at all Not difficult at all Not difficult at all    Outpatient Encounter Medications as of 11/27/2019  Medication Sig  . acetaminophen (TYLENOL) 325 MG tablet Take 2 tablets (650 mg total) by mouth every 6 (six) hours as needed for mild pain or headache.  . albuterol (PROAIR HFA) 108 (90 Base) MCG/ACT inhaler 1-2 puffs every 6 hours as needed wheezing or shortness of breath.  [name] aspirin EC 81 MG tablet Take 1 tablet (81 mg total) by mouth daily.  [name] atorvastatin (LIPITOR) 40 MG tablet TAKE 1 TABLET ONCE DAILY AT 6PM  . buPROPion (WELLBUTRIN SR) 150 MG 12 hr tablet Take 1 tablet (150 mg total) by mouth daily with breakfast.  . butalbital-acetaminophen-caffeine (ESGIC) 50-325-40 MG tablet Take 1 tablet by mouth every 6 (six) hours as  needed for headache.  . celecoxib (CELEBREX) 200 MG capsule Take 1 capsule (200 mg total) by mouth daily as needed.  . cetaphil (CETAPHIL) lotion Apply 1 application topically 2 (two) times daily. For rash. Apply after washing with [facility] shower gel and rinsing with clear warm water.  . DULoxetine (CYMBALTA) 60 MG capsule Take 1 capsule (60 mg total) by mouth daily. WITH A FULL STOMACH AT SUPPER TIME  . fexofenadine (ALLEGRA) 180 MG tablet Take 1 tablet (180 mg total) by mouth daily. For allergy symptoms  . fluocinonide-emollient (LIDEX-E) 0.05 % cream Apply 1 application topically 2 (two) times daily. To affected areas  . fluticasone (FLONASE) 50 MCG/ACT nasal spray Place 2 sprays into both nostrils daily as needed for allergies or rhinitis.  . fluticasone furoate-vilanterol (BREO ELLIPTA) 100-25 MCG/INH AEPB Inhale 1 puff into the lungs daily.  . Ipratropium-Albuterol (COMBIVENT RESPIMAT) 20-100 MCG/ACT AERS respimat Inhale 1 puff into the lungs every 6 (six) hours  . isosorbide mononitrate (IMDUR) 60 MG 24 hr tablet Take 1 tablet (60 mg total) by mouth daily.  . metoprolol tartrate (LOPRESSOR) 25 MG tablet Take 1 tablet (25 mg total) by mouth 2 (two) times daily.  . mirtazapine (REMERON) 15 MG tablet Take 1 tablet (15 mg total) by mouth at bedtime. For sleep  . nitroGLYCERIN (NITROSTAT) 0.4 MG SL tablet Place 1 tablet (0.4 mg total) under the tongue every 5 (five) minutes  x 3 doses as needed for chest pain.  . pantoprazole (PROTONIX) 40 MG tablet TAKE  (1)  TABLET TWICE A DAY.  . tamsulosin (FLOMAX) 0.4 MG CAPS capsule Take 2 capsules (0.8 mg total) by mouth daily after supper.   No facility-administered encounter medications on file as of 11/27/2019.     Goals Addressed              This Visit's Progress   .  "I need transportation to come in and get my B12 shot" (pt-stated)        Transportation needs in patient with infantile cerebral palsy, hypertension, and hx of MI  Current  Barriers:  . Film/video editor.  . Literacy barriers . Transportation barriers in patient with Chronic Diagnoses of HTN, GAD, Hx of MI, CAD, Apraxia of Speech, DDD, Depression  Nurse Case Manager Clinical Goal(s):  [name] Over the next 10 days, patient will have transportation to office visit for B12 injection  Interventions:  . [name] reviewed appointment information with patient . [name] advised patient that transportation has been arranged for his 11:00 B12 appt on 12/04/2019 . [name] talked with client about current client social work needs   Patient Self Care Activities:  . Performs most ADLs but does have in home Aide and has help form his sisters for most all IADLs   Please see past updates related to this goal by clicking on the "Past Updates" button in the selected goal        Follow Up Plan:  [name] to call client in next 4 weeks to talk with client about social work needs of client at that time  Jonnie Truxillo.Jeyren Danowski MSW, [name] Licensed Clinical Social Worker [facility] [phone]

## 2019-11-27 NOTE — Patient Instructions (Addendum)
Licensed Clinical Social Worker Visit Information  Goals we discussed today:  Goals Addressed              This Visit's Progress   .  "I need transportation to come in and get my B12 shot" (pt-stated)        Transportation needs in patient with infantile cerebral palsy, hypertension, and hx of MI  Current Barriers:  . Film/video editor.  . Literacy barriers . Transportation barriers in patient with Chronic Diagnoses of HTN, GAD, Hx of MI, CAD, Apraxia of Speech, DDD, Depression  Nurse Case Manager Clinical Goal(s):  [name] Over the next 10 days, patient will have transportation to office visit for B12 injection  Interventions:  . [name] reviewed appointment information with patient . [name] advised patient that transportation has been arranged for his 11:00 B12 appt on 12/04/2019  [name] talked with client about current client social work needs  Patient Self Care Activities:  . Performs most ADLs but does have in home Aide and has help form his sisters for most all IADLs   Please see past updates related to this goal by clicking on the "Past Updates" button in the selected goal          Materials Provided:  No  Follow Up Plan:  [name] to call client in next 4 weeks to talk with client about social work needs of client at that time  The patient verbalized understanding of instructions provided today and declined a print copy of patient instruction materials.   [name].Kentarius Partington MSW, [name] Licensed Clinical Social Worker [facility] [phone]

## 2019-12-04 ENCOUNTER — Other Ambulatory Visit: Payer: Self-pay

## 2019-12-04 ENCOUNTER — Ambulatory Visit (INDEPENDENT_AMBULATORY_CARE_PROVIDER_SITE_OTHER): Payer: Medicare Other | Admitting: Family Medicine

## 2019-12-04 DIAGNOSIS — E538 Deficiency of other specified B group vitamins: Secondary | ICD-10-CM

## 2019-12-04 MED ORDER — CYANOCOBALAMIN 1000 MCG/ML IJ SOLN
1000.0000 ug | INTRAMUSCULAR | Status: AC
Start: 2019-12-04 — End: ?
  Administered 2019-12-04 – 2024-05-04 (×48): 1000 ug via INTRAMUSCULAR

## 2019-12-06 ENCOUNTER — Ambulatory Visit: Payer: Self-pay | Admitting: Licensed Clinical Social Worker

## 2019-12-06 DIAGNOSIS — M5136 Other intervertebral disc degeneration, lumbar region: Secondary | ICD-10-CM

## 2019-12-06 DIAGNOSIS — J441 Chronic obstructive pulmonary disease with (acute) exacerbation: Secondary | ICD-10-CM

## 2019-12-06 DIAGNOSIS — G809 Cerebral palsy, unspecified: Secondary | ICD-10-CM

## 2019-12-06 DIAGNOSIS — F32A Depression, unspecified: Secondary | ICD-10-CM

## 2019-12-06 DIAGNOSIS — F329 Major depressive disorder, single episode, unspecified: Secondary | ICD-10-CM

## 2019-12-06 DIAGNOSIS — E538 Deficiency of other specified B group vitamins: Secondary | ICD-10-CM

## 2019-12-06 DIAGNOSIS — I252 Old myocardial infarction: Secondary | ICD-10-CM

## 2019-12-06 DIAGNOSIS — I10 Essential (primary) hypertension: Secondary | ICD-10-CM

## 2019-12-06 DIAGNOSIS — R482 Apraxia: Secondary | ICD-10-CM

## 2019-12-06 DIAGNOSIS — F411 Generalized anxiety disorder: Secondary | ICD-10-CM

## 2019-12-06 DIAGNOSIS — I25118 Atherosclerotic heart disease of native coronary artery with other forms of angina pectoris: Secondary | ICD-10-CM

## 2019-12-06 DIAGNOSIS — M51369 Other intervertebral disc degeneration, lumbar region without mention of lumbar back pain or lower extremity pain: Secondary | ICD-10-CM

## 2019-12-06 DIAGNOSIS — K219 Gastro-esophageal reflux disease without esophagitis: Secondary | ICD-10-CM

## 2019-12-06 NOTE — Chronic Care Management (AMB) (Signed)
Chronic Care Management    Clinical Social Work Follow Up Note  12/06/2019 Name: [name] MRN: [id number] DOB: 12/15/1955  [name] is a 64 y.o. year old male who is a primary care patient of [name], [name], MD. The CCM team was consulted for assistance with [name] .   Review of patient status, including review of consultants reports, other relevant assessments, and collaboration with appropriate care team members and the patient's provider was performed as part of comprehensive patient evaluation and provision of chronic care management services.    SDOH (Social Determinants of Health) assessments performed: No; risk for stress; risk for depression;risk for transport needs; risk of tobacco use    Chronic Care Management from 02/21/2019 in [place]  PHQ-9 Total Score 9       GAD 7 : Generalized Anxiety Score 04/10/2019 05/01/2016 03/31/2016  Nervous, Anxious, on Edge 0 1 3  Control/stop worrying 0 1 3  Worry too much - different things 0 1 3  Trouble relaxing 0 0 2  Restless 0 0 2  Easily annoyed or irritable 0 0 0  Afraid - awful might happen 0 0 0  Total GAD 7 Score 0 3 13  Anxiety Difficulty Not difficult at all Not difficult at all Not difficult at all    Outpatient Encounter Medications as of 12/06/2019  Medication Sig  . acetaminophen (TYLENOL) 325 MG tablet Take 2 tablets (650 mg total) by mouth every 6 (six) hours as needed for mild pain or headache.  . albuterol (PROAIR HFA) 108 (90 Base) MCG/ACT inhaler 1-2 puffs every 6 hours as needed wheezing or shortness of breath.  [name] aspirin EC 81 MG tablet Take 1 tablet (81 mg total) by mouth daily.  [name] atorvastatin (LIPITOR) 40 MG tablet TAKE 1 TABLET ONCE DAILY AT 6PM  . buPROPion (WELLBUTRIN SR) 150 MG 12 hr tablet Take 1 tablet (150 mg total) by mouth daily with breakfast.  . butalbital-acetaminophen-caffeine (ESGIC) 50-325-40 MG tablet Take 1 tablet by mouth every 6 (six) hours as  needed for headache.  . celecoxib (CELEBREX) 200 MG capsule Take 1 capsule (200 mg total) by mouth daily as needed.  . cetaphil (CETAPHIL) lotion Apply 1 application topically 2 (two) times daily. For rash. Apply after washing with [facility] shower gel and rinsing with clear warm water.  . DULoxetine (CYMBALTA) 60 MG capsule Take 1 capsule (60 mg total) by mouth daily. WITH A FULL STOMACH AT SUPPER TIME  . fexofenadine (ALLEGRA) 180 MG tablet Take 1 tablet (180 mg total) by mouth daily. For allergy symptoms  . fluocinonide-emollient (LIDEX-E) 0.05 % cream Apply 1 application topically 2 (two) times daily. To affected areas  . fluticasone (FLONASE) 50 MCG/ACT nasal spray Place 2 sprays into both nostrils daily as needed for allergies or rhinitis.  . fluticasone furoate-vilanterol (BREO ELLIPTA) 100-25 MCG/INH AEPB Inhale 1 puff into the lungs daily.  . Ipratropium-Albuterol (COMBIVENT RESPIMAT) 20-100 MCG/ACT AERS respimat Inhale 1 puff into the lungs every 6 (six) hours  . isosorbide mononitrate (IMDUR) 60 MG 24 hr tablet Take 1 tablet (60 mg total) by mouth daily.  . metoprolol tartrate (LOPRESSOR) 25 MG tablet Take 1 tablet (25 mg total) by mouth 2 (two) times daily.  . mirtazapine (REMERON) 15 MG tablet Take 1 tablet (15 mg total) by mouth at bedtime. For sleep  . nitroGLYCERIN (NITROSTAT) 0.4 MG SL tablet Place 1 tablet (0.4 mg total) under the tongue every 5 (five) minutes  x 3 doses as needed for chest pain.  . pantoprazole (PROTONIX) 40 MG tablet TAKE  (1)  TABLET TWICE A DAY.  . tamsulosin (FLOMAX) 0.4 MG CAPS capsule Take 2 capsules (0.8 mg total) by mouth daily after supper.   Facility-Administered Encounter Medications as of 12/06/2019  Medication  . cyanocobalamin ((VITAMIN B-12)) injection 1,000 mcg       "I need transportation to come in and see Dr Livia Snellen for my next scheduled appointment (pt-stated)           Transportation needs in patient with infantile cerebral palsy,  hypertension, and hx of MI  Current Barriers:   Film/video editor.   Literacy barriers  Transportation barriers in patient with Chronic Diagnoses of HTN, GAD, Hx of MI, CAD, Apraxia of Speech, DDD, Depression  Nurse Case Manager Clinical Goal(s):   Over the next 10 days, patient will have transportation to office visit for next scheduled appointment with Dr. Livia Snellen  Interventions:   [name] reviewed appointment information with patient  [name] advised patient that transportation has been arranged for his  12/20/2019 appointment with Dr. Livia Snellen  [name] talked with client about current client social work needs   Patient Self Care Activities:   Performs most ADLs but does have in home Aide and has help form his sisters for most all IADLs   Please see past updates related to this goal by clicking on the "Past Updates" button in the selected goal        Follow Up Plan:  [name] to call client as scheduled to assess social work needs of client.   Follow Up Plan:   Severin Bou.Wanza Szumski MSW, [name] Licensed Clinical Social Worker [facility] [phone]

## 2019-12-06 NOTE — Patient Instructions (Addendum)
Licensed Clinical Social Worker Visit Information  Goals we discussed today:     "I need transportation to come in and see Dr Livia Snellen for my next scheduled appointment (pt-stated)           Transportation needs in patient with infantile cerebral palsy, hypertension, and hx of MI  Current Barriers:   Film/video editor.   Literacy barriers  Transportation barriers in patient with Chronic Diagnoses of HTN, GAD, Hx of MI, CAD, Apraxia of Speech, DDD, Depression  Nurse Case Manager Clinical Goal(s):  Over the next 10 days, patient will have transportation to office visit for next scheduled appointment with Dr. Livia Snellen  Interventions:  [name] reviewed appointment information with patient  [name] advised patient that transportation has been arranged for his  12/20/2019 appointment with Dr. Livia Snellen  [name] talked with client about current client social work needs   Patient Self Care Activities:   Performs most ADLs but does have in home Aide and has help form his sisters for most all IADLs   Please see past updates related to this goal by clicking on the "Past Updates" button in the selected goal       Follow Up Plan:[name] to call client as scheduled to assess social work needs of client.   Materials Provided: No  The patient verbalized understanding of instructions provided today and declined a print copy of patient instruction materials.   [name].[name] MSW, [name] Licensed Clinical Social Worker [facility] [phone]

## 2019-12-11 ENCOUNTER — Telehealth: Payer: Medicare Other

## 2019-12-14 ENCOUNTER — Ambulatory Visit: Payer: Medicare Other | Admitting: Family Medicine

## 2019-12-20 ENCOUNTER — Encounter: Payer: Self-pay | Admitting: Family Medicine

## 2019-12-20 ENCOUNTER — Ambulatory Visit: Payer: Self-pay | Admitting: Licensed Clinical Social Worker

## 2019-12-20 ENCOUNTER — Other Ambulatory Visit: Payer: Self-pay

## 2019-12-20 ENCOUNTER — Ambulatory Visit (INDEPENDENT_AMBULATORY_CARE_PROVIDER_SITE_OTHER): Payer: Medicare Other | Admitting: Family Medicine

## 2019-12-20 VITALS — BP 130/85 | HR 58 | Temp 96.7°F | Resp 20 | Ht 73.0 in | Wt 248.1 lb

## 2019-12-20 DIAGNOSIS — I209 Angina pectoris, unspecified: Secondary | ICD-10-CM

## 2019-12-20 DIAGNOSIS — I25118 Atherosclerotic heart disease of native coronary artery with other forms of angina pectoris: Secondary | ICD-10-CM

## 2019-12-20 DIAGNOSIS — I10 Essential (primary) hypertension: Secondary | ICD-10-CM

## 2019-12-20 DIAGNOSIS — G809 Cerebral palsy, unspecified: Secondary | ICD-10-CM

## 2019-12-20 DIAGNOSIS — M51369 Other intervertebral disc degeneration, lumbar region without mention of lumbar back pain or lower extremity pain: Secondary | ICD-10-CM

## 2019-12-20 DIAGNOSIS — F411 Generalized anxiety disorder: Secondary | ICD-10-CM

## 2019-12-20 DIAGNOSIS — R531 Weakness: Secondary | ICD-10-CM | POA: Diagnosis not present

## 2019-12-20 DIAGNOSIS — G4701 Insomnia due to medical condition: Secondary | ICD-10-CM

## 2019-12-20 DIAGNOSIS — M5136 Other intervertebral disc degeneration, lumbar region: Secondary | ICD-10-CM

## 2019-12-20 DIAGNOSIS — G40909 Epilepsy, unspecified, not intractable, without status epilepticus: Secondary | ICD-10-CM | POA: Diagnosis not present

## 2019-12-20 DIAGNOSIS — F3341 Major depressive disorder, recurrent, in partial remission: Secondary | ICD-10-CM

## 2019-12-20 DIAGNOSIS — I252 Old myocardial infarction: Secondary | ICD-10-CM

## 2019-12-20 DIAGNOSIS — K219 Gastro-esophageal reflux disease without esophagitis: Secondary | ICD-10-CM

## 2019-12-20 DIAGNOSIS — J431 Panlobular emphysema: Secondary | ICD-10-CM

## 2019-12-20 DIAGNOSIS — E785 Hyperlipidemia, unspecified: Secondary | ICD-10-CM

## 2019-12-20 DIAGNOSIS — R482 Apraxia: Secondary | ICD-10-CM | POA: Diagnosis not present

## 2019-12-20 DIAGNOSIS — N401 Enlarged prostate with lower urinary tract symptoms: Secondary | ICD-10-CM

## 2019-12-20 DIAGNOSIS — E538 Deficiency of other specified B group vitamins: Secondary | ICD-10-CM

## 2019-12-20 DIAGNOSIS — J441 Chronic obstructive pulmonary disease with (acute) exacerbation: Secondary | ICD-10-CM

## 2019-12-20 DIAGNOSIS — R351 Nocturia: Secondary | ICD-10-CM

## 2019-12-20 LAB — CMP14+EGFR
ALT: 24 IU/L (ref 0–44)
AST: 26 IU/L (ref 0–40)
Albumin/Globulin Ratio: 1.5 (ref 1.2–2.2)
Albumin: 4.4 g/dL (ref 3.8–4.8)
Alkaline Phosphatase: 76 IU/L (ref 48–121)
BUN/Creatinine Ratio: 11 (ref 10–24)
BUN: 9 mg/dL (ref 8–27)
Bilirubin Total: 0.5 mg/dL (ref 0.0–1.2)
CO2: 30 mmol/L — ABNORMAL HIGH (ref 20–29)
Calcium: 9.9 mg/dL (ref 8.6–10.2)
Chloride: 97 mmol/L (ref 96–106)
Creatinine, Ser: 0.81 mg/dL (ref 0.76–1.27)
GFR calc Af Amer: 111 mL/min/{1.73_m2} (ref >59)
GFR calc non Af Amer: 96 mL/min/{1.73_m2} (ref >59)
Globulin, Total: 3 g/dL (ref 1.5–4.5)
Glucose: 97 mg/dL (ref 65–99)
Potassium: 5 mmol/L (ref 3.5–5.2)
Sodium: 142 mmol/L (ref 134–144)
Total Protein: 7.4 g/dL (ref 6.0–8.5)

## 2019-12-20 LAB — CBC WITH DIFFERENTIAL/PLATELET
Basophils Absolute: 0.1 10*3/uL (ref 0.0–0.2)
Basos: 1 %
EOS (ABSOLUTE): 1 10*3/uL — ABNORMAL HIGH (ref 0.0–0.4)
Eos: 10 %
Hematocrit: 44 % (ref 37.5–51.0)
Hemoglobin: 15.9 g/dL (ref 13.0–17.7)
Immature Grans (Abs): 0 10*3/uL (ref 0.0–0.1)
Immature Granulocytes: 0 %
Lymphocytes Absolute: 3.2 10*3/uL — ABNORMAL HIGH (ref 0.7–3.1)
Lymphs: 34 %
MCH: 36.1 pg — ABNORMAL HIGH (ref 26.6–33.0)
MCHC: 36.1 g/dL — ABNORMAL HIGH (ref 31.5–35.7)
MCV: 100 fL — ABNORMAL HIGH (ref 79–97)
Monocytes Absolute: 0.6 10*3/uL (ref 0.1–0.9)
Monocytes: 6 %
Neutrophils Absolute: 4.7 10*3/uL (ref 1.4–7.0)
Neutrophils: 49 %
Platelets: 209 10*3/uL (ref 150–450)
RBC: 4.41 x10E6/uL (ref 4.14–5.80)
RDW: 12.7 % (ref 11.6–15.4)
WBC: 9.6 10*3/uL (ref 3.4–10.8)

## 2019-12-20 LAB — LIPID PANEL
Chol/HDL Ratio: 3.3 ratio (ref 0.0–5.0)
Cholesterol, Total: 101 mg/dL (ref 100–199)
HDL: 31 mg/dL — ABNORMAL LOW (ref >39)
LDL Chol Calc (NIH): 50 mg/dL (ref 0–99)
Triglycerides: 104 mg/dL (ref 0–149)
VLDL Cholesterol Cal: 20 mg/dL (ref 5–40)

## 2019-12-20 MED ORDER — FLUOCINONIDE-E 0.05 % EX CREA: 1.0000 "application " | TOPICAL_CREAM | Freq: Two times a day (BID) | CUTANEOUS | 5 refills | Status: DC

## 2019-12-20 MED ORDER — COMBIVENT RESPIMAT 20-100 MCG/ACT IN AERS: INHALATION_SPRAY | RESPIRATORY_TRACT | 11 refills | Status: DC

## 2019-12-20 MED ORDER — ALBUTEROL SULFATE HFA 108 (90 BASE) MCG/ACT IN AERS: INHALATION_SPRAY | RESPIRATORY_TRACT | 10 refills | Status: DC

## 2019-12-20 MED ORDER — BREO ELLIPTA 100-25 MCG/INH IN AEPB: INHALATION_SPRAY | RESPIRATORY_TRACT | 3 refills | Status: DC

## 2019-12-20 MED ORDER — METOPROLOL TARTRATE 25 MG PO TABS: 25.0000 mg | ORAL_TABLET | Freq: Two times a day (BID) | ORAL | 3 refills | Status: DC

## 2019-12-20 MED ORDER — FEXOFENADINE HCL 180 MG PO TABS: 180.0000 mg | ORAL_TABLET | Freq: Every day | ORAL | 2 refills | Status: AC

## 2019-12-20 MED ORDER — BUPROPION HCL ER (SR) 150 MG PO TB12: 150.0000 mg | ORAL_TABLET | Freq: Every day | ORAL | 1 refills | Status: DC

## 2019-12-20 MED ORDER — PANTOPRAZOLE SODIUM 40 MG PO TBEC: DELAYED_RELEASE_TABLET | ORAL | 3 refills | Status: DC

## 2019-12-20 MED ORDER — TAMSULOSIN HCL 0.4 MG PO CAPS: 0.8000 mg | ORAL_CAPSULE | Freq: Every day | ORAL | 3 refills | Status: DC

## 2019-12-20 MED ORDER — DULOXETINE HCL 60 MG PO CPEP: 60.0000 mg | ORAL_CAPSULE | Freq: Every day | ORAL | 1 refills | Status: DC

## 2019-12-20 MED ORDER — CELECOXIB 200 MG PO CAPS: 200.0000 mg | ORAL_CAPSULE | Freq: Every day | ORAL | 1 refills | Status: DC | PRN

## 2019-12-20 MED ORDER — MIRTAZAPINE 15 MG PO TABS: 15.0000 mg | ORAL_TABLET | Freq: Every day | ORAL | 3 refills | Status: DC

## 2019-12-20 MED ORDER — FLUTICASONE PROPIONATE 50 MCG/ACT NA SUSP: 2.0000 | Freq: Every day | NASAL | 1 refills | Status: DC | PRN

## 2019-12-20 MED ORDER — ISOSORBIDE MONONITRATE ER 60 MG PO TB24: 60.0000 mg | ORAL_TABLET | Freq: Every day | ORAL | 3 refills | Status: DC

## 2019-12-20 MED ORDER — ATORVASTATIN CALCIUM 40 MG PO TABS: ORAL_TABLET | ORAL | 1 refills | Status: DC

## 2019-12-20 NOTE — Chronic Care Management (AMB) (Signed)
Chronic Care Management    Clinical Social Work Follow Up Note  12/20/2019 Name: [name] MRN: [id number] DOB: May 07, 1955  [name] is a 64 y.o. year old male who is a primary care patient of [name], [name], MD. The CCM team was consulted for assistance with [name] .   Review of patient status, including review of consultants reports, other relevant assessments, and collaboration with appropriate care team members and the patient's provider was performed as part of comprehensive patient evaluation and provision of chronic care management services.    SDOH (Social Determinants of Health) assessments performed: No; risk for tobacco use; risk for depression; risk for stress; risk for transport needs    Chronic Care Management from 02/21/2019 in [place]  PHQ-9 Total Score 9       GAD 7 : Generalized Anxiety Score 04/10/2019 05/01/2016 03/31/2016  Nervous, Anxious, on Edge 0 1 3  Control/stop worrying 0 1 3  Worry too much - different things 0 1 3  Trouble relaxing 0 0 2  Restless 0 0 2  Easily annoyed or irritable 0 0 0  Afraid - awful might happen 0 0 0  Total GAD 7 Score 0 3 13  Anxiety Difficulty Not difficult at all Not difficult at all Not difficult at all    Outpatient Encounter Medications as of 12/20/2019  Medication Sig   acetaminophen (TYLENOL) 325 MG tablet Take 2 tablets (650 mg total) by mouth every 6 (six) hours as needed for mild pain or headache.   albuterol (PROAIR HFA) 108 (90 Base) MCG/ACT inhaler 1-2 puffs every 6 hours as needed wheezing or shortness of breath.   aspirin EC 81 MG tablet Take 1 tablet (81 mg total) by mouth daily.   atorvastatin (LIPITOR) 40 MG tablet TAKE 1 TABLET ONCE DAILY AT 6PM   buPROPion (WELLBUTRIN SR) 150 MG 12 hr tablet Take 1 tablet (150 mg total) by mouth daily with breakfast.   butalbital-acetaminophen-caffeine (ESGIC) 50-325-40 MG tablet Take 1 tablet by mouth every 6 (six) hours as  needed for headache.   celecoxib (CELEBREX) 200 MG capsule Take 1 capsule (200 mg total) by mouth daily as needed.   cetaphil (CETAPHIL) lotion Apply 1 application topically 2 (two) times daily. For rash. Apply after washing with [facility] shower gel and rinsing with clear warm water.   DULoxetine (CYMBALTA) 60 MG capsule Take 1 capsule (60 mg total) by mouth daily. WITH A FULL STOMACH AT SUPPER TIME   fexofenadine (ALLEGRA) 180 MG tablet Take 1 tablet (180 mg total) by mouth daily. For allergy symptoms   fluocinonide-emollient (LIDEX-E) 0.05 % cream Apply 1 application topically 2 (two) times daily. To affected areas   fluticasone (FLONASE) 50 MCG/ACT nasal spray Place 2 sprays into both nostrils daily as needed for allergies or rhinitis.   fluticasone furoate-vilanterol (BREO ELLIPTA) 100-25 MCG/INH AEPB Inhale 1 puff into the lungs daily.   Ipratropium-Albuterol (COMBIVENT RESPIMAT) 20-100 MCG/ACT AERS respimat Inhale 1 puff into the lungs every 6 (six) hours   isosorbide mononitrate (IMDUR) 60 MG 24 hr tablet Take 1 tablet (60 mg total) by mouth daily.   metoprolol tartrate (LOPRESSOR) 25 MG tablet Take 1 tablet (25 mg total) by mouth 2 (two) times daily.   mirtazapine (REMERON) 15 MG tablet Take 1 tablet (15 mg total) by mouth at bedtime. For sleep   nitroGLYCERIN (NITROSTAT) 0.4 MG SL tablet Place 1 tablet (0.4 mg total) under the tongue every 5 (five)  minutes x 3 doses as needed for chest pain.   pantoprazole (PROTONIX) 40 MG tablet TAKE  (1)  TABLET TWICE A DAY.   tamsulosin (FLOMAX) 0.4 MG CAPS capsule Take 2 capsules (0.8 mg total) by mouth daily after supper.   Facility-Administered Encounter Medications as of 12/20/2019  Medication   cyanocobalamin ((VITAMIN B-12)) injection 1,000 mcg     Goals Addressed                           This Visit's Progress      "I need transportation to come in and get my B12 shot" (pt-stated)          Transportation needs in patient with infantile cerebral palsy, hypertension, and hx of MI  Current Barriers:   Film/video editor.   Literacy barriers  Transportation barriers in patient with Chronic Diagnoses of HTN, GAD, Hx of MI, CAD, Apraxia of Speech, DDD, Depression  Nurse Case Manager Clinical Goal(s):   Over the next 10 days, patient will have transportation to office visit for B12 injection  Interventions:   [name] reviewed appointment information with patient  [name] advised patient that transportation has been arranged for his 11:30 B12 appt on 01/04/2020  [name] talked with client about current client social work needs   Patient Self Care Activities:   Performs most ADLs but does have in home Aide and has help form his sisters for most all IADLs   Please see past updates related to this goal by clicking on the "Past Updates" button in the selected goal        Follow Up Plan:  [name] to call client in next 4 weeks to talk with client about social work needs of client at that time  Breydan Shillingburg.Lauryn Lizardi MSW, [name] Licensed Clinical Social Worker [facility] [phone]

## 2019-12-20 NOTE — Patient Instructions (Addendum)
Licensed Clinical Social Worker Visit Information  Goals we discussed today:  .  "I need transportation to come in and get my B12 shot" (pt-stated)            Transportation needs in patient with infantile cerebral palsy, hypertension, and hx of MI  Current Barriers:   Film/video editor.   Literacy barriers  Transportation barriers in patient with Chronic Diagnoses of HTN, GAD, Hx of MI, CAD, Apraxia of Speech, DDD, Depression  Nurse Case Manager Clinical Goal(s):  Over the next 10 days, patient will have transportation to office visit for B12 injection  Interventions:  [name] reviewed appointment information with patient  [name] advised patient that transportation has been arranged for his 11:30 B12 appt on 01/04/2020  [name] talked with client about current client social work needs   Patient Self Care Activities:   Performs most ADLs but does have in home Aide and has help form his sisters for most all IADLs   Please see past updates related to this goal by clicking on the "Past Updates" button in the selected goal       Follow Up Plan:[name] to call client in next 4 weeks to talk with client about social work needs of client at that time  Materials Provided: No  The patient verbalized understanding of instructions provided today and declined a print copy of patient instruction materials.   [name].Taeja Debellis MSW, [name] Licensed Clinical Social Worker [facility] [phone]

## 2019-12-20 NOTE — Progress Notes (Signed)
Subjective:  Patient ID: [name], male    DOB: 09-25-1955  Age: 64 y.o. MRN: [id number]  CC: Medical Management of Chronic Issues   HPI [name] presents for  follow-up of hypertension. Patient has no history of headache chest pain or shortness of breath or recent cough. Patient also denies symptoms of TIA such as focal numbness or weakness. Patient denies side effects from medication. States taking it regularly.  Patient in for follow-up of elevated cholesterol. Doing well without complaints on current medication. Denies side effects of statin including myalgia and arthralgia and nausea. Also in today for liver function testing. Currently no chest pain, shortness of breath or other cardiovascular related symptoms noted. History [name] has a past medical history of Cerebral palsy ([place]), Coronary artery disease, Depression, GERD (gastroesophageal reflux disease), Hypertension, Scoliosis, and Seizures ([place]).   He has a past surgical history that includes Coronary angioplasty with stent; Carpal tunnel release (Right, 08/13/2016); and LEFT HEART CATH AND CORONARY ANGIOGRAPHY (N/A, 07/10/2017).   His family history includes Alcohol abuse in his brother; CAD in his brother and sister; CAD (age of onset: 32) in his father; Diabetes in his mother; Heart disease in his father; Heart failure in his sister.He reports that he quit smoking about 18 months ago. His smoking use included cigarettes. He has a 20.50 pack-year smoking history. He has never used smokeless tobacco. He reports current alcohol use. He reports that he does not use drugs.  Current Outpatient Medications on File Prior to Visit  Medication Sig Dispense Refill  . acetaminophen (TYLENOL) 325 MG tablet Take 2 tablets (650 mg total) by mouth every 6 (six) hours as needed for mild pain or headache.    [name] aspirin EC 81 MG tablet Take 1 tablet (81 mg total) by mouth daily. 30 tablet 11  . butalbital-acetaminophen-caffeine (ESGIC)  50-325-40 MG tablet Take 1 tablet by mouth every 6 (six) hours as needed for headache. 30 tablet 0  . cetaphil (CETAPHIL) lotion Apply 1 application topically 2 (two) times daily. For rash. Apply after washing with [facility] shower gel and rinsing with clear warm water. 473 mL 0  . nitroGLYCERIN (NITROSTAT) 0.4 MG SL tablet Place 1 tablet (0.4 mg total) under the tongue every 5 (five) minutes x 3 doses as needed for chest pain. 25 tablet 11   Current Facility-Administered Medications on File Prior to Visit  Medication Dose Route Frequency Provider Last Rate Last Admin  . cyanocobalamin ((VITAMIN B-12)) injection 1,000 mcg  1,000 mcg Intramuscular Q30 days [name], MD   1,000 mcg at 12/04/19 1042    ROS Review of Systems  Constitutional: Negative.   HENT: Negative.   Eyes: Negative for visual disturbance.  Respiratory: Negative for cough and shortness of breath.   Cardiovascular: Negative for chest pain and leg swelling.  Gastrointestinal: Negative for abdominal pain, diarrhea, nausea and vomiting.  Genitourinary: Negative for difficulty urinating.  Musculoskeletal: Positive for arthralgias and back pain. Negative for myalgias.  Skin: Negative for rash.  Neurological: Negative for headaches.  Psychiatric/Behavioral: Negative for sleep disturbance.    Objective:  BP 130/85   Pulse (!) 58   Temp (!) 96.7 F (35.9 C) (Temporal)   Resp 20   Ht _0  (1.854 m)   Wt 248 lb 2 oz (112.5 kg)   SpO2 95%   BMI 32.74 kg/m   BP Readings from Last 3 Encounters:  12/20/19 130/85  11/08/19 123/67  08/16/19 135/80    Wt  Readings from Last 3 Encounters:  12/20/19 248 lb 2 oz (112.5 kg)  11/08/19 246 lb 2 oz (111.6 kg)  08/16/19 241 lb 4 oz (109.4 kg)     Physical Exam Vitals reviewed.  Constitutional:      General: He is not in acute distress.    Appearance: He is well-developed.  HENT:     Head: Normocephalic and atraumatic.     Right Ear: Tympanic membrane and  external ear normal. No decreased hearing noted.     Left Ear: Tympanic membrane and external ear normal. No decreased hearing noted.     Nose: Nose normal.     Mouth/Throat:     Pharynx: No oropharyngeal exudate or posterior oropharyngeal erythema.  Eyes:     Conjunctiva/sclera: Conjunctivae normal.     Pupils: Pupils are equal, round, and reactive to light.  Cardiovascular:     Rate and Rhythm: Normal rate and regular rhythm.     Heart sounds: Normal heart sounds. No murmur heard.   Pulmonary:     Effort: Pulmonary effort is normal. No respiratory distress.     Breath sounds: Normal breath sounds. No wheezing or rales.  Abdominal:     General: Bowel sounds are normal.     Palpations: Abdomen is soft. There is no mass.     Tenderness: There is no abdominal tenderness.  Musculoskeletal:        General: Normal range of motion.     Cervical back: Normal range of motion and neck supple.  Skin:    General: Skin is warm and dry.  Neurological:     Mental Status: He is alert and oriented to person, place, and time.     Deep Tendon Reflexes: Reflexes are normal and symmetric.  Psychiatric:        Mood and Affect: Mood normal.        Behavior: Behavior normal.        Thought Content: Thought content normal.        Judgment: Judgment normal.       Assessment & Plan:   [name] was seen today for medical management of chronic issues.  Diagnoses and all orders for this visit:  Infantile cerebral palsy ([place]) -     CBC with Differential/Platelet -     CMP14+EGFR  Essential hypertension -     metoprolol tartrate (LOPRESSOR) 25 MG tablet; Take 1 tablet (25 mg total) by mouth 2 (two) times daily. -     CBC with Differential/Platelet -     CMP14+EGFR  Seizure disorder (HCC) -     CBC with Differential/Platelet -     CMP14+EGFR  Apraxia of speech -     CBC with Differential/Platelet -     CMP14+EGFR  Left-sided weakness -     CBC with Differential/Platelet -      CMP14+EGFR  Panlobular emphysema (HCC) -     albuterol (PROAIR HFA) 108 (90 Base) MCG/ACT inhaler; 1-2 puffs every 6 hours as needed wheezing or shortness of breath. -     fluticasone (FLONASE) 50 MCG/ACT nasal spray; Place 2 sprays into both nostrils daily as needed for allergies or rhinitis. -     fluticasone furoate-vilanterol (BREO ELLIPTA) 100-25 MCG/INH AEPB; Inhale 1 puff into the lungs daily. -     Ipratropium-Albuterol (COMBIVENT RESPIMAT) 20-100 MCG/ACT AERS respimat; Inhale 1 puff into the lungs every 6 (six) hours -     CBC with Differential/Platelet -     CMP14+EGFR  Dyslipidemia,  goal LDL below 70 -     atorvastatin (LIPITOR) 40 MG tablet; TAKE 1 TABLET ONCE DAILY AT 6PM -     CBC with Differential/Platelet -     CMP14+EGFR -     Lipid panel  Coronary artery disease of native artery of native heart with stable angina pectoris (HCC) -     atorvastatin (LIPITOR) 40 MG tablet; TAKE 1 TABLET ONCE DAILY AT 6PM -     isosorbide mononitrate (IMDUR) 60 MG 24 hr tablet; Take 1 tablet (60 mg total) by mouth daily. -     metoprolol tartrate (LOPRESSOR) 25 MG tablet; Take 1 tablet (25 mg total) by mouth 2 (two) times daily. -     CBC with Differential/Platelet -     CMP14+EGFR  Recurrent major depressive disorder, in partial remission (HCC) -     buPROPion (WELLBUTRIN SR) 150 MG 12 hr tablet; Take 1 tablet (150 mg total) by mouth daily with breakfast. -     DULoxetine (CYMBALTA) 60 MG capsule; Take 1 capsule (60 mg total) by mouth daily. WITH A FULL STOMACH AT SUPPER TIME -     mirtazapine (REMERON) 15 MG tablet; Take 1 tablet (15 mg total) by mouth at bedtime. For sleep -     CBC with Differential/Platelet -     CMP14+EGFR  Degenerative disc disease, lumbar -     celecoxib (CELEBREX) 200 MG capsule; Take 1 capsule (200 mg total) by mouth daily as needed. -     CBC with Differential/Platelet -     CMP14+EGFR  GAD (generalized anxiety disorder) -     DULoxetine (CYMBALTA) 60 MG  capsule; Take 1 capsule (60 mg total) by mouth daily. WITH A FULL STOMACH AT SUPPER TIME -     CBC with Differential/Platelet -     CMP14+EGFR  Angina pectoris (HCC) -     isosorbide mononitrate (IMDUR) 60 MG 24 hr tablet; Take 1 tablet (60 mg total) by mouth daily. -     metoprolol tartrate (LOPRESSOR) 25 MG tablet; Take 1 tablet (25 mg total) by mouth 2 (two) times daily. -     CBC with Differential/Platelet -     CMP14+EGFR  Insomnia due to medical condition -     mirtazapine (REMERON) 15 MG tablet; Take 1 tablet (15 mg total) by mouth at bedtime. For sleep -     CBC with Differential/Platelet -     CMP14+EGFR  Gastroesophageal reflux disease, unspecified whether esophagitis present -     pantoprazole (PROTONIX) 40 MG tablet; TAKE  (1)  TABLET TWICE A DAY. -     CBC with Differential/Platelet -     CMP14+EGFR  Benign prostatic hyperplasia with nocturia -     tamsulosin (FLOMAX) 0.4 MG CAPS capsule; Take 2 capsules (0.8 mg total) by mouth daily after supper. -     CBC with Differential/Platelet -     CMP14+EGFR  Other orders -     fexofenadine (ALLEGRA) 180 MG tablet; Take 1 tablet (180 mg total) by mouth daily. For allergy symptoms -     fluocinonide-emollient (LIDEX-E) 0.05 % cream; Apply 1 application topically 2 (two) times daily. To affected areas   Allergies as of 12/20/2019      Reactions   Chantix [varenicline Tartrate] Nausea And Vomiting      Medication List       Accurate as of December 20, 2019 10:19 PM. If you have any questions, ask your nurse or doctor.  acetaminophen 325 MG tablet Commonly known as: TYLENOL Take 2 tablets (650 mg total) by mouth every 6 (six) hours as needed for mild pain or headache.   albuterol 108 (90 Base) MCG/ACT inhaler Commonly known as: ProAir HFA 1-2 puffs every 6 hours as needed wheezing or shortness of breath.   aspirin EC 81 MG tablet Take 1 tablet (81 mg total) by mouth daily.   atorvastatin 40 MG  tablet Commonly known as: LIPITOR TAKE 1 TABLET ONCE DAILY AT 6PM   Breo Ellipta 100-25 MCG/INH Aepb Generic drug: fluticasone furoate-vilanterol Inhale 1 puff into the lungs daily.   buPROPion 150 MG 12 hr tablet Commonly known as: WELLBUTRIN SR Take 1 tablet (150 mg total) by mouth daily with breakfast.   butalbital-acetaminophen-caffeine 50-325-40 MG tablet Commonly known as: Esgic Take 1 tablet by mouth every 6 (six) hours as needed for headache.   celecoxib 200 MG capsule Commonly known as: CELEBREX Take 1 capsule (200 mg total) by mouth daily as needed.   cetaphil lotion Apply 1 application topically 2 (two) times daily. For rash. Apply after washing with [facility] shower gel and rinsing with clear warm water.   Combivent Respimat 20-100 MCG/ACT Aers respimat Generic drug: Ipratropium-Albuterol Inhale 1 puff into the lungs every 6 (six) hours   DULoxetine 60 MG capsule Commonly known as: CYMBALTA Take 1 capsule (60 mg total) by mouth daily. WITH A FULL STOMACH AT SUPPER TIME   fexofenadine 180 MG tablet Commonly known as: ALLEGRA Take 1 tablet (180 mg total) by mouth daily. For allergy symptoms   fluocinonide-emollient 0.05 % cream Commonly known as: LIDEX-E Apply 1 application topically 2 (two) times daily. To affected areas   fluticasone 50 MCG/ACT nasal spray Commonly known as: FLONASE Place 2 sprays into both nostrils daily as needed for allergies or rhinitis.   isosorbide mononitrate 60 MG 24 hr tablet Commonly known as: IMDUR Take 1 tablet (60 mg total) by mouth daily.   metoprolol tartrate 25 MG tablet Commonly known as: LOPRESSOR Take 1 tablet (25 mg total) by mouth 2 (two) times daily.   mirtazapine 15 MG tablet Commonly known as: Remeron Take 1 tablet (15 mg total) by mouth at bedtime. For sleep   nitroGLYCERIN 0.4 MG SL tablet Commonly known as: NITROSTAT Place 1 tablet (0.4 mg total) under the tongue every 5 (five) minutes x 3 doses as  needed for chest pain.   pantoprazole 40 MG tablet Commonly known as: PROTONIX TAKE  (1)  TABLET TWICE A DAY.   tamsulosin 0.4 MG Caps capsule Commonly known as: FLOMAX Take 2 capsules (0.8 mg total) by mouth daily after supper.       Meds ordered this encounter  Medications  . albuterol (PROAIR HFA) 108 (90 Base) MCG/ACT inhaler    Sig: 1-2 puffs every 6 hours as needed wheezing or shortness of breath.    Dispense:  17 g    Refill:  10  . atorvastatin (LIPITOR) 40 MG tablet    Sig: TAKE 1 TABLET ONCE DAILY AT 6PM    Dispense:  90 tablet    Refill:  1  . buPROPion (WELLBUTRIN SR) 150 MG 12 hr tablet    Sig: Take 1 tablet (150 mg total) by mouth daily with breakfast.    Dispense:  90 tablet    Refill:  1  . celecoxib (CELEBREX) 200 MG capsule    Sig: Take 1 capsule (200 mg total) by mouth daily as needed.    Dispense:  90 capsule    Refill:  1  . DULoxetine (CYMBALTA) 60 MG capsule    Sig: Take 1 capsule (60 mg total) by mouth daily. WITH A FULL STOMACH AT SUPPER TIME    Dispense:  90 capsule    Refill:  1  . fexofenadine (ALLEGRA) 180 MG tablet    Sig: Take 1 tablet (180 mg total) by mouth daily. For allergy symptoms    Dispense:  90 tablet    Refill:  2  . fluocinonide-emollient (LIDEX-E) 0.05 % cream    Sig: Apply 1 application topically 2 (two) times daily. To affected areas    Dispense:  60 g    Refill:  5  . fluticasone (FLONASE) 50 MCG/ACT nasal spray    Sig: Place 2 sprays into both nostrils daily as needed for allergies or rhinitis.    Dispense:  48 g    Refill:  1  . fluticasone furoate-vilanterol (BREO ELLIPTA) 100-25 MCG/INH AEPB    Sig: Inhale 1 puff into the lungs daily.    Dispense:  90 each    Refill:  3  . Ipratropium-Albuterol (COMBIVENT RESPIMAT) 20-100 MCG/ACT AERS respimat    Sig: Inhale 1 puff into the lungs every 6 (six) hours    Dispense:  4 g    Refill:  11  . isosorbide mononitrate (IMDUR) 60 MG 24 hr tablet    Sig: Take 1 tablet (60 mg  total) by mouth daily.    Dispense:  90 tablet    Refill:  3  . metoprolol tartrate (LOPRESSOR) 25 MG tablet    Sig: Take 1 tablet (25 mg total) by mouth 2 (two) times daily.    Dispense:  180 tablet    Refill:  3  . mirtazapine (REMERON) 15 MG tablet    Sig: Take 1 tablet (15 mg total) by mouth at bedtime. For sleep    Dispense:  90 tablet    Refill:  3  . pantoprazole (PROTONIX) 40 MG tablet    Sig: TAKE  (1)  TABLET TWICE A DAY.    Dispense:  180 tablet    Refill:  3  . tamsulosin (FLOMAX) 0.4 MG CAPS capsule    Sig: Take 2 capsules (0.8 mg total) by mouth daily after supper.    Dispense:  180 capsule    Refill:  3      Follow-up: Return in about 6 months (around 06/18/2020).  [name], M.D.

## 2019-12-24 NOTE — Progress Notes (Signed)
Hello Jeramih,  Your lab result is normal and/or stable.Some minor variations that are not significant are commonly marked abnormal, but do not represent any medical problem for you.  Best regards, [name], M.D.

## 2020-01-03 ENCOUNTER — Ambulatory Visit (INDEPENDENT_AMBULATORY_CARE_PROVIDER_SITE_OTHER): Payer: Medicare Other | Admitting: Licensed Clinical Social Worker

## 2020-01-03 DIAGNOSIS — G40909 Epilepsy, unspecified, not intractable, without status epilepticus: Secondary | ICD-10-CM

## 2020-01-03 DIAGNOSIS — F3341 Major depressive disorder, recurrent, in partial remission: Secondary | ICD-10-CM | POA: Diagnosis not present

## 2020-01-03 DIAGNOSIS — J441 Chronic obstructive pulmonary disease with (acute) exacerbation: Secondary | ICD-10-CM | POA: Diagnosis not present

## 2020-01-03 DIAGNOSIS — I10 Essential (primary) hypertension: Secondary | ICD-10-CM | POA: Diagnosis not present

## 2020-01-03 DIAGNOSIS — I252 Old myocardial infarction: Secondary | ICD-10-CM | POA: Diagnosis not present

## 2020-01-03 DIAGNOSIS — F329 Major depressive disorder, single episode, unspecified: Secondary | ICD-10-CM | POA: Diagnosis not present

## 2020-01-03 DIAGNOSIS — R482 Apraxia: Secondary | ICD-10-CM

## 2020-01-03 DIAGNOSIS — I25118 Atherosclerotic heart disease of native coronary artery with other forms of angina pectoris: Secondary | ICD-10-CM

## 2020-01-03 DIAGNOSIS — G809 Cerebral palsy, unspecified: Secondary | ICD-10-CM

## 2020-01-03 DIAGNOSIS — K219 Gastro-esophageal reflux disease without esophagitis: Secondary | ICD-10-CM

## 2020-01-03 DIAGNOSIS — F411 Generalized anxiety disorder: Secondary | ICD-10-CM

## 2020-01-03 DIAGNOSIS — E538 Deficiency of other specified B group vitamins: Secondary | ICD-10-CM

## 2020-01-03 DIAGNOSIS — M5136 Other intervertebral disc degeneration, lumbar region: Secondary | ICD-10-CM

## 2020-01-03 NOTE — Chronic Care Management (AMB) (Signed)
Chronic Care Management    Clinical Social Work Follow Up Note  01/03/2020 Name: [name] MRN: [id number] DOB: 1955/09/04  [name] is a 64 y.o. year old male who is a primary care patient of [name], James Gash, MD. The CCM team was consulted for assistance with [name] .   Review of patient status, including review of consultants reports, other relevant assessments, and collaboration with appropriate care team members and the patient's provider was performed as part of comprehensive patient evaluation and provision of chronic care management services.    SDOH (Social Determinants of Health) assessments performed: No;risk for transport needs; risk for depression; risk for stress; risk for physical inactivity    Chronic Care Management from 02/21/2019 in [facility]  PHQ-9 Total Score 9     GAD 7 : Generalized Anxiety Score 04/10/2019 05/01/2016 03/31/2016  Nervous, Anxious, on Edge 0 1 3  Control/stop worrying 0 1 3  Worry too much - different things 0 1 3  Trouble relaxing 0 0 2  Restless 0 0 2  Easily annoyed or irritable 0 0 0  Afraid - awful might happen 0 0 0  Total GAD 7 Score 0 3 13  Anxiety Difficulty Not difficult at all Not difficult at all Not difficult at all    Outpatient Encounter Medications as of 01/03/2020  Medication Sig  . acetaminophen (TYLENOL) 325 MG tablet Take 2 tablets (650 mg total) by mouth every 6 (six) hours as needed for mild pain or headache.  . albuterol (PROAIR HFA) 108 (90 Base) MCG/ACT inhaler 1-2 puffs every 6 hours as needed wheezing or shortness of breath.  [name] aspirin EC 81 MG tablet Take 1 tablet (81 mg total) by mouth daily.  [name] atorvastatin (LIPITOR) 40 MG tablet TAKE 1 TABLET ONCE DAILY AT 6PM  . buPROPion (WELLBUTRIN SR) 150 MG 12 hr tablet Take 1 tablet (150 mg total) by mouth daily with breakfast.  . butalbital-acetaminophen-caffeine (ESGIC) 50-325-40 MG tablet Take 1 tablet by mouth every 6 (six)  hours as needed for headache.  . celecoxib (CELEBREX) 200 MG capsule Take 1 capsule (200 mg total) by mouth daily as needed.  . cetaphil (CETAPHIL) lotion Apply 1 application topically 2 (two) times daily. For rash. Apply after washing with [facility] shower gel and rinsing with clear warm water.  . DULoxetine (CYMBALTA) 60 MG capsule Take 1 capsule (60 mg total) by mouth daily. WITH A FULL STOMACH AT SUPPER TIME  . fexofenadine (ALLEGRA) 180 MG tablet Take 1 tablet (180 mg total) by mouth daily. For allergy symptoms  . fluocinonide-emollient (LIDEX-E) 0.05 % cream Apply 1 application topically 2 (two) times daily. To affected areas  . fluticasone (FLONASE) 50 MCG/ACT nasal spray Place 2 sprays into both nostrils daily as needed for allergies or rhinitis.  . fluticasone furoate-vilanterol (BREO ELLIPTA) 100-25 MCG/INH AEPB Inhale 1 puff into the lungs daily.  . Ipratropium-Albuterol (COMBIVENT RESPIMAT) 20-100 MCG/ACT AERS respimat Inhale 1 puff into the lungs every 6 (six) hours  . isosorbide mononitrate (IMDUR) 60 MG 24 hr tablet Take 1 tablet (60 mg total) by mouth daily.  . metoprolol tartrate (LOPRESSOR) 25 MG tablet Take 1 tablet (25 mg total) by mouth 2 (two) times daily.  . mirtazapine (REMERON) 15 MG tablet Take 1 tablet (15 mg total) by mouth at bedtime. For sleep  . nitroGLYCERIN (NITROSTAT) 0.4 MG SL tablet Place 1 tablet (0.4 mg total) under the tongue every 5 (five) minutes x 3  doses as needed for chest pain.  . pantoprazole (PROTONIX) 40 MG tablet TAKE  (1)  TABLET TWICE A DAY.  . tamsulosin (FLOMAX) 0.4 MG CAPS capsule Take 2 capsules (0.8 mg total) by mouth daily after supper.   Facility-Administered Encounter Medications as of 01/03/2020  Medication  . cyanocobalamin ((VITAMIN B-12)) injection 1,000 mcg     Goals Addressed              This Visit's Progress   .  COMPLETED: "I need a ride to get my B12 shot" (pt-stated)        Current Barriers:  . Museum/gallery exhibitions officer.  . Literacy barriers . Transportation barriers in client with Chronic Diagnoses of DDD, Depressin Apraxia of Speech, CAD GAD, Hx MI, HTN, Infantile Cerebral Palsy  . Cognitive Deficits  Nurse Case Manager Clinical Goal(s):  [name] Over the next 7 days, patient will have transportation arranged for B12 injection that is scheduled for 01/04/2020 at 11:30 AM at [facility] .  Interventions:  . [name] contacted [name] representative today and confirmed client transport arrangements with [name] for 01/04/20 11:30 AM appointment for client at [facility] . [name] communicated transport arrangements information with client today   Patient Self Care Activities:  . Currently UNABLE TO independently drive to medical appointments.   Please see past updates related to this goal by clicking on the "Past Updates" button in the selected goal        Follow Up Plan: [name] to call client in next 4 weeks to discuss social work needs of client at that time  Raynald Rouillard.Sidi Dzikowski MSW, [name] Licensed Clinical Social Worker [facility] [phone]

## 2020-01-03 NOTE — Patient Instructions (Addendum)
Licensed Clinical Social Worker Visit Information  Goals we discussed today:  Goals Addressed              This Visit's Progress   .  COMPLETED: "I need a ride to get my B12 shot" (pt-stated)        Current Barriers:  . Film/video editor.  . Literacy barriers . Transportation barriers in client with Chronic Diagnoses of DDD, Depressin Apraxia of Speech, CAD GAD, Hx MI, HTN, Infantile Cerebral Palsy  . Cognitive Deficits  Nurse Case Manager Clinical Goal(s):  [name] Over the next 7 days, patient will have transportation arranged for B12 injection that is scheduled for 01/04/2020 at 11:30 AM at [facility] .  Interventions:  . [name] contacted [name] representative today and confirmed client transport arrangements with [name] for 01/04/20 11:30 AM appointment for client at [facility] . [name] communicated transport arrangements information with client today   Patient Self Care Activities:  . Currently UNABLE TO independently drive to medical appointments.   Please see past updates related to this goal by clicking on the "Past Updates" button in the selected goal        Materials Provided: No  Follow Up Plan: [name] to call client in next 4 weeks to discuss social work needs of client at that time  The patient verbalized understanding of instructions provided today and declined a print copy of patient instruction materials.   [name].Zacharie Portner MSW, [name] Licensed Clinical Social Worker [facility] [phone]

## 2020-01-04 ENCOUNTER — Other Ambulatory Visit: Payer: Self-pay

## 2020-01-04 ENCOUNTER — Ambulatory Visit: Payer: Self-pay | Admitting: Licensed Clinical Social Worker

## 2020-01-04 ENCOUNTER — Ambulatory Visit (INDEPENDENT_AMBULATORY_CARE_PROVIDER_SITE_OTHER): Payer: Medicare Other

## 2020-01-04 DIAGNOSIS — F3341 Major depressive disorder, recurrent, in partial remission: Secondary | ICD-10-CM

## 2020-01-04 DIAGNOSIS — I10 Essential (primary) hypertension: Secondary | ICD-10-CM

## 2020-01-04 DIAGNOSIS — R482 Apraxia: Secondary | ICD-10-CM

## 2020-01-04 DIAGNOSIS — M51369 Other intervertebral disc degeneration, lumbar region without mention of lumbar back pain or lower extremity pain: Secondary | ICD-10-CM

## 2020-01-04 DIAGNOSIS — J441 Chronic obstructive pulmonary disease with (acute) exacerbation: Secondary | ICD-10-CM

## 2020-01-04 DIAGNOSIS — M5136 Other intervertebral disc degeneration, lumbar region: Secondary | ICD-10-CM

## 2020-01-04 DIAGNOSIS — E538 Deficiency of other specified B group vitamins: Secondary | ICD-10-CM

## 2020-01-04 DIAGNOSIS — I25118 Atherosclerotic heart disease of native coronary artery with other forms of angina pectoris: Secondary | ICD-10-CM

## 2020-01-04 DIAGNOSIS — K219 Gastro-esophageal reflux disease without esophagitis: Secondary | ICD-10-CM

## 2020-01-04 DIAGNOSIS — F411 Generalized anxiety disorder: Secondary | ICD-10-CM

## 2020-01-04 DIAGNOSIS — I252 Old myocardial infarction: Secondary | ICD-10-CM

## 2020-01-04 DIAGNOSIS — G809 Cerebral palsy, unspecified: Secondary | ICD-10-CM

## 2020-01-04 NOTE — Patient Instructions (Addendum)
Licensed Clinical Social Worker Visit Information  Goals we discussed today:                         This Visit's Progress    .  COMPLETED: "I need a ride to get my B12 shot" (pt-stated)         Current Barriers:   Film/video editor.   Literacy barriers  Transportation barriers in client with Chronic Diagnoses of DDD, Depressin Apraxia of Speech, CAD GAD, Hx MI, HTN, Infantile Cerebral Palsy   Cognitive Deficits  Nurse Case Manager Clinical Goal(s):  Over the next 7 days, patient will have transportation arranged for B12 injection that is scheduled for 01/04/2020 at 11:30 AM at [facility]   Interventions:   [name] contacted [name] representative previously and confirmed client transport arrangements with [place] for 01/04/20 11:30 AM appointment for client at [facility]  [name] communicated transport arrangements information with client today   Patient Self Care Activities:   Currently UNABLE TO independently drive to medical appointments.   Please see past updates related to this goal by clicking on the "Past Updates" button in the selected goal       Follow Up Plan:[name] to call client in next 4 weeks to discuss social work needs of client at that time  Materials Provided: No  The patient verbalized understanding of instructions provided today and declined a print copy of patient instruction materials.   [name].Hassel Uphoff MSW, [name] Licensed Clinical Social Worker [facility] [phone]

## 2020-01-04 NOTE — Chronic Care Management (AMB) (Signed)
Chronic Care Management    Clinical Social Work Follow Up Note  01/04/2020 Name: [name] MRN: [id number] DOB: [birth year]  [name] is a 64 y.o. year old male who is a primary care patient of [name], James Gash, MD. The CCM team was consulted for assistance with [name] .   Review of patient status, including review of consultants reports, other relevant assessments, and collaboration with appropriate care team members and the patient's provider was performed as part of comprehensive patient evaluation and provision of chronic care management services.    SDOH (Social Determinants of Health) assessments performed: No;risk for stress; risk for depression; risk for tobacco use; risk for physical inactivity    Chronic Care Management from 02/21/2019 in [place]  PHQ-9 Total Score 9     GAD 7 : Generalized Anxiety Score 04/10/2019 05/01/2016 03/31/2016  Nervous, Anxious, on Edge 0 1 3  Control/stop worrying 0 1 3  Worry too much - different things 0 1 3  Trouble relaxing 0 0 2  Restless 0 0 2  Easily annoyed or irritable 0 0 0  Afraid - awful might happen 0 0 0  Total GAD 7 Score 0 3 13  Anxiety Difficulty Not difficult at all Not difficult at all Not difficult at all    Outpatient Encounter Medications as of 01/04/2020  Medication Sig  . acetaminophen (TYLENOL) 325 MG tablet Take 2 tablets (650 mg total) by mouth every 6 (six) hours as needed for mild pain or headache.  . albuterol (PROAIR HFA) 108 (90 Base) MCG/ACT inhaler 1-2 puffs every 6 hours as needed wheezing or shortness of breath.  [name] aspirin EC 81 MG tablet Take 1 tablet (81 mg total) by mouth daily.  [name] atorvastatin (LIPITOR) 40 MG tablet TAKE 1 TABLET ONCE DAILY AT 6PM  . buPROPion (WELLBUTRIN SR) 150 MG 12 hr tablet Take 1 tablet (150 mg total) by mouth daily with breakfast.  . butalbital-acetaminophen-caffeine (ESGIC) 50-325-40 MG tablet Take 1 tablet by mouth every 6 (six) hours  as needed for headache.  . celecoxib (CELEBREX) 200 MG capsule Take 1 capsule (200 mg total) by mouth daily as needed.  . cetaphil (CETAPHIL) lotion Apply 1 application topically 2 (two) times daily. For rash. Apply after washing with [facility] shower gel and rinsing with clear warm water.  . DULoxetine (CYMBALTA) 60 MG capsule Take 1 capsule (60 mg total) by mouth daily. WITH A FULL STOMACH AT SUPPER TIME  . fexofenadine (ALLEGRA) 180 MG tablet Take 1 tablet (180 mg total) by mouth daily. For allergy symptoms  . fluocinonide-emollient (LIDEX-E) 0.05 % cream Apply 1 application topically 2 (two) times daily. To affected areas  . fluticasone (FLONASE) 50 MCG/ACT nasal spray Place 2 sprays into both nostrils daily as needed for allergies or rhinitis.  . fluticasone furoate-vilanterol (BREO ELLIPTA) 100-25 MCG/INH AEPB Inhale 1 puff into the lungs daily.  . Ipratropium-Albuterol (COMBIVENT RESPIMAT) 20-100 MCG/ACT AERS respimat Inhale 1 puff into the lungs every 6 (six) hours  . isosorbide mononitrate (IMDUR) 60 MG 24 hr tablet Take 1 tablet (60 mg total) by mouth daily.  . metoprolol tartrate (LOPRESSOR) 25 MG tablet Take 1 tablet (25 mg total) by mouth 2 (two) times daily.  . mirtazapine (REMERON) 15 MG tablet Take 1 tablet (15 mg total) by mouth at bedtime. For sleep  . nitroGLYCERIN (NITROSTAT) 0.4 MG SL tablet Place 1 tablet (0.4 mg total) under the tongue every 5 (five) minutes x 3  doses as needed for chest pain.  . pantoprazole (PROTONIX) 40 MG tablet TAKE  (1)  TABLET TWICE A DAY.  . tamsulosin (FLOMAX) 0.4 MG CAPS capsule Take 2 capsules (0.8 mg total) by mouth daily after supper.   Facility-Administered Encounter Medications as of 01/04/2020  Medication  . cyanocobalamin ((VITAMIN B-12)) injection 1,000 mcg    Goals Addressed                          This Visit's Progress    .  COMPLETED: "I need a ride to get my B12 shot" (pt-stated)         Current Barriers:     Film/video editor.   Literacy barriers  Transportation barriers in client with Chronic Diagnoses of DDD, Depressin Apraxia of Speech, CAD GAD, Hx MI, HTN, Infantile Cerebral Palsy   Cognitive Deficits  Nurse Case Manager Clinical Goal(s):   Over the next 7 days, patient will have transportation arranged for B12 injection that is scheduled for 01/04/2020 at 11:30 AM at [facility]   Interventions:   [name] contacted [name] representative previously and confirmed client transport arrangements with [name] for 01/04/20 11:30 AM appointment for client at [facility]  [name] communicated transport arrangements information with client today   Patient Self Care Activities:   Currently UNABLE TO independently drive to medical appointments.   Please see past updates related to this goal by clicking on the "Past Updates" button in the selected goal        Follow Up Plan: [name] to call client in next 4 weeks to discuss social work needs of client at that time  [name].[name] MSW, [name] Licensed Clinical Social Worker [facility] [phone]

## 2020-01-16 ENCOUNTER — Telehealth: Payer: Self-pay | Admitting: Family Medicine

## 2020-01-16 ENCOUNTER — Ambulatory Visit: Payer: Self-pay | Admitting: Licensed Clinical Social Worker

## 2020-01-16 DIAGNOSIS — F329 Major depressive disorder, single episode, unspecified: Secondary | ICD-10-CM | POA: Diagnosis not present

## 2020-01-16 DIAGNOSIS — I252 Old myocardial infarction: Secondary | ICD-10-CM

## 2020-01-16 DIAGNOSIS — F32A Depression, unspecified: Secondary | ICD-10-CM

## 2020-01-16 DIAGNOSIS — R482 Apraxia: Secondary | ICD-10-CM

## 2020-01-16 DIAGNOSIS — M51369 Other intervertebral disc degeneration, lumbar region without mention of lumbar back pain or lower extremity pain: Secondary | ICD-10-CM

## 2020-01-16 DIAGNOSIS — E538 Deficiency of other specified B group vitamins: Secondary | ICD-10-CM

## 2020-01-16 DIAGNOSIS — F3341 Major depressive disorder, recurrent, in partial remission: Secondary | ICD-10-CM

## 2020-01-16 DIAGNOSIS — G809 Cerebral palsy, unspecified: Secondary | ICD-10-CM

## 2020-01-16 DIAGNOSIS — G40909 Epilepsy, unspecified, not intractable, without status epilepticus: Secondary | ICD-10-CM

## 2020-01-16 DIAGNOSIS — I10 Essential (primary) hypertension: Secondary | ICD-10-CM

## 2020-01-16 DIAGNOSIS — M5136 Other intervertebral disc degeneration, lumbar region: Secondary | ICD-10-CM

## 2020-01-16 DIAGNOSIS — K219 Gastro-esophageal reflux disease without esophagitis: Secondary | ICD-10-CM

## 2020-01-16 DIAGNOSIS — J441 Chronic obstructive pulmonary disease with (acute) exacerbation: Secondary | ICD-10-CM | POA: Diagnosis not present

## 2020-01-16 DIAGNOSIS — I25118 Atherosclerotic heart disease of native coronary artery with other forms of angina pectoris: Secondary | ICD-10-CM

## 2020-01-16 DIAGNOSIS — F411 Generalized anxiety disorder: Secondary | ICD-10-CM

## 2020-01-16 NOTE — Patient Instructions (Signed)
Licensed Clinical Social Worker Visit Information  Goals we discussed today:  Goals Addressed              This Visit's Progress   .  COMPLETED: "I need transportation to come in and get my B12 shot" (pt-stated)        Transportation needs in patient with infantile cerebral palsy, hypertension, and hx of MI  Current Barriers:  . Film/video editor.  . Literacy barriers . Transportation barriers in patient with Chronic Diagnoses of HTN, GAD, Hx of MI, CAD, Apraxia of Speech, DDD, Depression  Nurse Case Manager Clinical Goal(s):  [name] Over the next 10 days, patient will have transportation to office visit for B12 injection  Interventions:  . Talked with patient about his upcoming schedule for medical appointments . Informed client that his next B12 injection appointment at [facility] is on 02/06/2020 . Informed client that [name] would make transport arrangements with [name] for client for February 06, 2020 B12 injection appointment for client at [facility]   Patient Self Care Activities:  . Performs most ADLs but does have in home Aide and has help form his sisters for most all IADLs   Please see past updates related to this goal by clicking on the "Past Updates" button in the selected goal         Materials Provided: No  Follow Up Plan: [name] to call client in next 4 weeks to talk with him about social work needs of client  The patient verbalized understanding of instructions provided today and declined a print copy of patient instruction materials.   [name].Collin Rengel MSW, [name] Licensed Clinical Social Worker [facility] [phone]

## 2020-01-16 NOTE — Telephone Encounter (Signed)
PCS form for change of status typed and put on providers desk

## 2020-01-16 NOTE — Chronic Care Management (AMB) (Signed)
Chronic Care Management    Clinical Social Work Follow Up Note  01/16/2020 Name: [name] MRN: [id number] DOB: August 16, 1955  [name] is a 64 y.o. year old male who is a primary care patient of [name], James Gash, MD. The CCM team was consulted for assistance with [name] .   Review of patient status, including review of consultants reports, other relevant assessments, and collaboration with appropriate care team members and the patient's provider was performed as part of comprehensive patient evaluation and provision of chronic care management services.    SDOH (Social Determinants of Health) assessments performed: No; risk for depression; risk for tobacco use;risk for stress; risk for physical inactivity; risk for transport needs    Chronic Care Management from 02/21/2019 in [place]  PHQ-9 Total Score 9       GAD 7 : Generalized Anxiety Score 04/10/2019 05/01/2016 03/31/2016  Nervous, Anxious, on Edge 0 1 3  Control/stop worrying 0 1 3  Worry too much - different things 0 1 3  Trouble relaxing 0 0 2  Restless 0 0 2  Easily annoyed or irritable 0 0 0  Afraid - awful might happen 0 0 0  Total GAD 7 Score 0 3 13  Anxiety Difficulty Not difficult at all Not difficult at all Not difficult at all    Outpatient Encounter Medications as of 01/16/2020  Medication Sig  . acetaminophen (TYLENOL) 325 MG tablet Take 2 tablets (650 mg total) by mouth every 6 (six) hours as needed for mild pain or headache.  . albuterol (PROAIR HFA) 108 (90 Base) MCG/ACT inhaler 1-2 puffs every 6 hours as needed wheezing or shortness of breath.  [name] aspirin EC 81 MG tablet Take 1 tablet (81 mg total) by mouth daily.  [name] atorvastatin (LIPITOR) 40 MG tablet TAKE 1 TABLET ONCE DAILY AT 6PM  . buPROPion (WELLBUTRIN SR) 150 MG 12 hr tablet Take 1 tablet (150 mg total) by mouth daily with breakfast.  . butalbital-acetaminophen-caffeine (ESGIC) 50-325-40 MG tablet Take 1 tablet  by mouth every 6 (six) hours as needed for headache.  . celecoxib (CELEBREX) 200 MG capsule Take 1 capsule (200 mg total) by mouth daily as needed.  . cetaphil (CETAPHIL) lotion Apply 1 application topically 2 (two) times daily. For rash. Apply after washing with [facility] shower gel and rinsing with clear warm water.  . DULoxetine (CYMBALTA) 60 MG capsule Take 1 capsule (60 mg total) by mouth daily. WITH A FULL STOMACH AT SUPPER TIME  . fexofenadine (ALLEGRA) 180 MG tablet Take 1 tablet (180 mg total) by mouth daily. For allergy symptoms  . fluocinonide-emollient (LIDEX-E) 0.05 % cream Apply 1 application topically 2 (two) times daily. To affected areas  . fluticasone (FLONASE) 50 MCG/ACT nasal spray Place 2 sprays into both nostrils daily as needed for allergies or rhinitis.  . fluticasone furoate-vilanterol (BREO ELLIPTA) 100-25 MCG/INH AEPB Inhale 1 puff into the lungs daily.  . Ipratropium-Albuterol (COMBIVENT RESPIMAT) 20-100 MCG/ACT AERS respimat Inhale 1 puff into the lungs every 6 (six) hours  . isosorbide mononitrate (IMDUR) 60 MG 24 hr tablet Take 1 tablet (60 mg total) by mouth daily.  . metoprolol tartrate (LOPRESSOR) 25 MG tablet Take 1 tablet (25 mg total) by mouth 2 (two) times daily.  . mirtazapine (REMERON) 15 MG tablet Take 1 tablet (15 mg total) by mouth at bedtime. For sleep  . nitroGLYCERIN (NITROSTAT) 0.4 MG SL tablet Place 1 tablet (0.4 mg total) under the tongue  every 5 (five) minutes x 3 doses as needed for chest pain.  . pantoprazole (PROTONIX) 40 MG tablet TAKE  (1)  TABLET TWICE A DAY.  . tamsulosin (FLOMAX) 0.4 MG CAPS capsule Take 2 capsules (0.8 mg total) by mouth daily after supper.   Facility-Administered Encounter Medications as of 01/16/2020  Medication  . cyanocobalamin ((VITAMIN B-12)) injection 1,000 mcg     Goals Addressed              This Visit's Progress   .  COMPLETED: "I need transportation to come in and get my B12 shot" (pt-stated)         Transportation needs in patient with infantile cerebral palsy, hypertension, and hx of MI  Current Barriers:  . Film/video editor.  . Literacy barriers . Transportation barriers in patient with Chronic Diagnoses of HTN, GAD, Hx of MI, CAD, Apraxia of Speech, DDD, Depression  Nurse Case Manager Clinical Goal(s):  [name] Over the next 10 days, patient will have transportation to office visit for B12 injection  Interventions:  . Talked with patient about his upcoming schedule for medical appointments . Informed client that his next B12 injection appointment at [facility] is on 02/06/2020 . Informed client that [name] would make transport arrangements with [name] for client for February 06, 2020 B12 injection appointment for client at [facility]   Patient Self Care Activities:  . Performs most ADLs but does have in home Aide and has help form his sisters for most all IADLs   Please see past updates related to this goal by clicking on the "Past Updates" button in the selected goal         Follow Up Plan: [name] to call client in next 4 weeks to talk with client about the social work needs of client at that time  [name].[name] MSW, [name] Licensed Clinical Social Worker [facility] Care [facility] [phone]

## 2020-01-18 NOTE — Chronic Care Management (AMB) (Signed)
  Chronic Care Management   Outreach Note  01/20/2019 Name: [name] MRN: [id number] DOB: [birth year]  Referred by: [name], MD Reason for referral : Chronic Care Management   An unsuccessful telephone outreach was attempted today. The patient was referred to the case management team for assistance with care management and care coordination.   Follow Up Plan: The care management team will reach out to the patient again over the next 30 days.   [name], BSN, RN-BC Embedded Chronic Care Manager [facility] / [place] [facility] Direct Dial: [phone]

## 2020-01-22 ENCOUNTER — Telehealth: Payer: Self-pay | Admitting: *Deleted

## 2020-01-22 ENCOUNTER — Telehealth: Payer: Medicare Other | Admitting: *Deleted

## 2020-01-24 ENCOUNTER — Ambulatory Visit: Payer: Self-pay | Admitting: Licensed Clinical Social Worker

## 2020-01-24 DIAGNOSIS — E538 Deficiency of other specified B group vitamins: Secondary | ICD-10-CM

## 2020-01-24 DIAGNOSIS — I252 Old myocardial infarction: Secondary | ICD-10-CM

## 2020-01-24 DIAGNOSIS — M51369 Other intervertebral disc degeneration, lumbar region without mention of lumbar back pain or lower extremity pain: Secondary | ICD-10-CM

## 2020-01-24 DIAGNOSIS — K219 Gastro-esophageal reflux disease without esophagitis: Secondary | ICD-10-CM

## 2020-01-24 DIAGNOSIS — J441 Chronic obstructive pulmonary disease with (acute) exacerbation: Secondary | ICD-10-CM

## 2020-01-24 DIAGNOSIS — I25118 Atherosclerotic heart disease of native coronary artery with other forms of angina pectoris: Secondary | ICD-10-CM

## 2020-01-24 DIAGNOSIS — F411 Generalized anxiety disorder: Secondary | ICD-10-CM

## 2020-01-24 DIAGNOSIS — F3341 Major depressive disorder, recurrent, in partial remission: Secondary | ICD-10-CM

## 2020-01-24 DIAGNOSIS — R482 Apraxia: Secondary | ICD-10-CM

## 2020-01-24 DIAGNOSIS — M5136 Other intervertebral disc degeneration, lumbar region: Secondary | ICD-10-CM

## 2020-01-24 DIAGNOSIS — G809 Cerebral palsy, unspecified: Secondary | ICD-10-CM

## 2020-01-24 DIAGNOSIS — I10 Essential (primary) hypertension: Secondary | ICD-10-CM

## 2020-01-24 NOTE — Telephone Encounter (Signed)
°  Chronic Care Management   Outreach Note  01/22/2020 Name: [name] MRN: [id number] DOB: Oct 14, 1955  Referred by: [name], MD Reason for referral : No chief complaint on file.   An unsuccessful telephone follow-up was attempted today. The patient was referred to the case management team for assistance with care management and care coordination.   Follow Up Plan: The care management team will reach out to the patient again over the next 15 days.   [name], BSN, RN-BC Embedded Chronic Care Manager [facility] / [place] [facility] Direct Dial: [phone]

## 2020-01-24 NOTE — Chronic Care Management (AMB) (Signed)
Chronic Care Management    Clinical Social Work Follow Up Note  01/24/2020 Name: [name] MRN: [id number] DOB: Apr 26, 1955  [name] is a 64 y.o. year old male who is a primary care patient of [name], [name], MD. The CCM team was consulted for assistance with [name] .   Review of patient status, including review of consultants reports, other relevant assessments, and collaboration with appropriate care team members and the patient's provider was performed as part of comprehensive patient evaluation and provision of chronic care management services.    SDOH (Social Determinants of Health) assessments performed: No; risk for depression ; risk for stress; risk for physical inactivity; risk for tobacco use; risk for financial strain    Chronic Care Management from 02/21/2019 in [place]  PHQ-9 Total Score 9       GAD 7 : Generalized Anxiety Score 04/10/2019 05/01/2016 03/31/2016  Nervous, Anxious, on Edge 0 1 3  Control/stop worrying 0 1 3  Worry too much - different things 0 1 3  Trouble relaxing 0 0 2  Restless 0 0 2  Easily annoyed or irritable 0 0 0  Afraid - awful might happen 0 0 0  Total GAD 7 Score 0 3 13  Anxiety Difficulty Not difficult at all Not difficult at all Not difficult at all    Outpatient Encounter Medications as of 01/24/2020  Medication Sig  . acetaminophen (TYLENOL) 325 MG tablet Take 2 tablets (650 mg total) by mouth every 6 (six) hours as needed for mild pain or headache.  . albuterol (PROAIR HFA) 108 (90 Base) MCG/ACT inhaler 1-2 puffs every 6 hours as needed wheezing or shortness of breath.  [name] aspirin EC 81 MG tablet Take 1 tablet (81 mg total) by mouth daily.  [name] atorvastatin (LIPITOR) 40 MG tablet TAKE 1 TABLET ONCE DAILY AT 6PM  . buPROPion (WELLBUTRIN SR) 150 MG 12 hr tablet Take 1 tablet (150 mg total) by mouth daily with breakfast.  . butalbital-acetaminophen-caffeine (ESGIC) 50-325-40 MG tablet Take 1  tablet by mouth every 6 (six) hours as needed for headache.  . celecoxib (CELEBREX) 200 MG capsule Take 1 capsule (200 mg total) by mouth daily as needed.  . cetaphil (CETAPHIL) lotion Apply 1 application topically 2 (two) times daily. For rash. Apply after washing with [facility] shower gel and rinsing with clear warm water.  . DULoxetine (CYMBALTA) 60 MG capsule Take 1 capsule (60 mg total) by mouth daily. WITH A FULL STOMACH AT SUPPER TIME  . fexofenadine (ALLEGRA) 180 MG tablet Take 1 tablet (180 mg total) by mouth daily. For allergy symptoms  . fluocinonide-emollient (LIDEX-E) 0.05 % cream Apply 1 application topically 2 (two) times daily. To affected areas  . fluticasone (FLONASE) 50 MCG/ACT nasal spray Place 2 sprays into both nostrils daily as needed for allergies or rhinitis.  . fluticasone furoate-vilanterol (BREO ELLIPTA) 100-25 MCG/INH AEPB Inhale 1 puff into the lungs daily.  . Ipratropium-Albuterol (COMBIVENT RESPIMAT) 20-100 MCG/ACT AERS respimat Inhale 1 puff into the lungs every 6 (six) hours  . isosorbide mononitrate (IMDUR) 60 MG 24 hr tablet Take 1 tablet (60 mg total) by mouth daily.  . metoprolol tartrate (LOPRESSOR) 25 MG tablet Take 1 tablet (25 mg total) by mouth 2 (two) times daily.  . mirtazapine (REMERON) 15 MG tablet Take 1 tablet (15 mg total) by mouth at bedtime. For sleep  . nitroGLYCERIN (NITROSTAT) 0.4 MG SL tablet Place 1 tablet (0.4 mg total) under  the tongue every 5 (five) minutes x 3 doses as needed for chest pain.  . pantoprazole (PROTONIX) 40 MG tablet TAKE  (1)  TABLET TWICE A DAY.  . tamsulosin (FLOMAX) 0.4 MG CAPS capsule Take 2 capsules (0.8 mg total) by mouth daily after supper.   Facility-Administered Encounter Medications as of 01/24/2020  Medication  . cyanocobalamin ((VITAMIN B-12)) injection 1,000 mcg     Goals Addressed              This Visit's Progress   .  COMPLETED: "I need a ride to get my B12 shot" (pt-stated)        Current  Barriers:  . Film/video editor.  . Literacy barriers . Transportation barriers in client with Chronic Diagnoses of DDD, Depressin Apraxia of Speech, CAD GAD, Hx MI, HTN, Infantile Cerebral Palsy  . Cognitive Deficits  Nurse Case Manager Clinical Goal(s):  [name] Over the next 7 days, patient will have transportation arranged for B12 injection that is scheduled for 02/06/2020 at 11:30 AM at [facility] .  Interventions:  . [name] contacted [name] representative today and confirmed client transport arrangements with [name] for 02/06/2020 11:30 AM appointment for client at [facility]   Patient Self Care Activities:  . Currently UNABLE TO independently drive to medical appointments.   Please see past updates related to this goal by clicking on the "Past Updates" button in the selected goal         Follow Up Plan: [name] to call client in next 4 weeks to talk with client about social work needs of client at that time  Dareion Kneece.Rodnisha Blomgren MSW, [name] Licensed Clinical Social Worker [facility] [phone]

## 2020-01-24 NOTE — Patient Instructions (Signed)
Licensed Clinical Social Worker Visit Information  Goals we discussed today:  Goals Addressed              This Visit's Progress   .  COMPLETED: "I need a ride to get my B12 shot" (pt-stated)        Current Barriers:  . Film/video editor.  . Literacy barriers . Transportation barriers in client with Chronic Diagnoses of DDD, Depressin Apraxia of Speech, CAD GAD, Hx MI, HTN, Infantile Cerebral Palsy  . Cognitive Deficits  Nurse Case Manager Clinical Goal(s):  [name] Over the next 7 days, patient will have transportation arranged for B12 injection that is scheduled for 02/06/2020 at 11:30 AM at [facility] .  Interventions:  . [name] contacted [name] representative today and confirmed client transport arrangements with [name] for 02/06/2020 11:30 AM appointment for client at [facility]   Patient Self Care Activities:  . Currently UNABLE TO independently drive to medical appointments.   Please see past updates related to this goal by clicking on the "Past Updates" button in the selected goal         Materials Provided: No  Follow Up Plan: [name] to call client in next 4 weeks to talk with client about the social work  needs of client at that time  The patient verbalized understanding of instructions provided today and declined a print copy of patient instruction materials.   [name].Ouita Nish MSW, [name] Licensed Clinical Social Worker [facility] [phone]

## 2020-01-25 ENCOUNTER — Telehealth: Payer: Self-pay

## 2020-01-25 NOTE — Telephone Encounter (Signed)
The paperwork needs to explain what change has occurred that requires the increase in hours. I'm not aware of that. Could you contact him to determine how he might have changed in a way that requires greater assistance? Thanks, [name]

## 2020-01-26 ENCOUNTER — Ambulatory Visit (INDEPENDENT_AMBULATORY_CARE_PROVIDER_SITE_OTHER): Payer: Medicare Other | Admitting: *Deleted

## 2020-01-26 DIAGNOSIS — I10 Essential (primary) hypertension: Secondary | ICD-10-CM

## 2020-01-26 DIAGNOSIS — R531 Weakness: Secondary | ICD-10-CM

## 2020-01-26 DIAGNOSIS — G809 Cerebral palsy, unspecified: Secondary | ICD-10-CM

## 2020-01-26 DIAGNOSIS — F3341 Major depressive disorder, recurrent, in partial remission: Secondary | ICD-10-CM

## 2020-01-26 NOTE — Telephone Encounter (Signed)
For personal care services

## 2020-01-26 NOTE — Chronic Care Management (AMB) (Signed)
Chronic Care Management   Follow Up Note   01/26/2020 Name: [name] MRN: [id number] DOB: 11-05-1955  Referred by: [name], MD Reason for referral : Chronic Care Management (RN follow up: PCS)   [name] is a 64 y.o. year old male who is a primary care patient of [name], [name], MD. The CCM team was consulted for assistance with chronic disease management and care coordination needs.    Review of patient status, including review of consultants reports, relevant laboratory and other test results, and collaboration with appropriate care team members and the patient's provider was performed as part of comprehensive patient evaluation and provision of chronic care management services.    SDOH (Social Determinants of Health) assessments performed: HER:[id number] See Care Plan activities for detailed interventions related to [facility])     Outpatient Encounter Medications as of 01/26/2020  Medication Sig  . acetaminophen (TYLENOL) 325 MG tablet Take 2 tablets (650 mg total) by mouth every 6 (six) hours as needed for mild pain or headache.  . albuterol (PROAIR HFA) 108 (90 Base) MCG/ACT inhaler 1-2 puffs every 6 hours as needed wheezing or shortness of breath.  [name] aspirin EC 81 MG tablet Take 1 tablet (81 mg total) by mouth daily.  [name] atorvastatin (LIPITOR) 40 MG tablet TAKE 1 TABLET ONCE DAILY AT 6PM  . buPROPion (WELLBUTRIN SR) 150 MG 12 hr tablet Take 1 tablet (150 mg total) by mouth daily with breakfast.  . butalbital-acetaminophen-caffeine (ESGIC) 50-325-40 MG tablet Take 1 tablet by mouth every 6 (six) hours as needed for headache.  . celecoxib (CELEBREX) 200 MG capsule Take 1 capsule (200 mg total) by mouth daily as needed.  . cetaphil (CETAPHIL) lotion Apply 1 application topically 2 (two) times daily. For rash. Apply after washing with [facility] shower gel and rinsing with clear warm water.  . DULoxetine (CYMBALTA) 60 MG capsule Take 1 capsule (60 mg total) by mouth  daily. WITH A FULL STOMACH AT SUPPER TIME  . fexofenadine (ALLEGRA) 180 MG tablet Take 1 tablet (180 mg total) by mouth daily. For allergy symptoms  . fluocinonide-emollient (LIDEX-E) 0.05 % cream Apply 1 application topically 2 (two) times daily. To affected areas  . fluticasone (FLONASE) 50 MCG/ACT nasal spray Place 2 sprays into both nostrils daily as needed for allergies or rhinitis.  . fluticasone furoate-vilanterol (BREO ELLIPTA) 100-25 MCG/INH AEPB Inhale 1 puff into the lungs daily.  . Ipratropium-Albuterol (COMBIVENT RESPIMAT) 20-100 MCG/ACT AERS respimat Inhale 1 puff into the lungs every 6 (six) hours  . isosorbide mononitrate (IMDUR) 60 MG 24 hr tablet Take 1 tablet (60 mg total) by mouth daily.  . metoprolol tartrate (LOPRESSOR) 25 MG tablet Take 1 tablet (25 mg total) by mouth 2 (two) times daily.  . mirtazapine (REMERON) 15 MG tablet Take 1 tablet (15 mg total) by mouth at bedtime. For sleep  . nitroGLYCERIN (NITROSTAT) 0.4 MG SL tablet Place 1 tablet (0.4 mg total) under the tongue every 5 (five) minutes x 3 doses as needed for chest pain.  . pantoprazole (PROTONIX) 40 MG tablet TAKE  (1)  TABLET TWICE A DAY.  . tamsulosin (FLOMAX) 0.4 MG CAPS capsule Take 2 capsules (0.8 mg total) by mouth daily after supper.   Facility-Administered Encounter Medications as of 01/26/2020  Medication  . cyanocobalamin ((VITAMIN B-12)) injection 1,000 mcg     Goals Addressed              This Visit's Progress     Patient  Stated   .  "I need my Aide to get more hours" (pt-stated)        [place] (see longitudinal plan of care for additional care plan information)  Current Barriers:  . Care Coordination needs related to in-home care services in a patient with hypertension, hx of MI, infantile cerebral palsy, and depression . Literacy barriers . Transportation barriers . Cognitive Deficits  Nurse Case Manager Clinical Goal(s):  [name] Over the next 10 days, patient will meet with  PCP to discuss medical changes that necessitate an increase in PCS hours  Interventions:  . Inter-disciplinary care team collaboration (see longitudinal plan of care) . Chart reviewed . Collaborated with [name], [name] . Collaborated with PCP, Dr Livia Snellen regarding patient's request for an increase in Aide hours . Talked with patient by telephone . Aide currently comes 3 hours a day Mon-Fri o Helps with general household needs and meal prep . Discussed safety concerns around dizziness and falls and weight loss due to decrease in appetite . Reviewed and discussed medications . Collaborated with [facility] triage nurse to schedule visit with Dr Livia Snellen on 02/06/20 . [name] transportation will be arranged by [name]  Patient Self Care Activities:  . Calls provider office for new concerns or questions . Takes prepackaged medications as prescribed . Unable to independently drive or complete all ADLs and IADLs independently   Initial goal documentation     .  "I want to find out why I'm falling" (pt-stated)        CARE PLAN ENTRY (see longitudinal plan of care for additional care plan information)  Current Barriers:  . Care Coordination needs related to new onset of recurrent falls in a patient with infantile cerebral palsy, HTN, and COPD (disease states) . Cognitive Deficits  Nurse Case Manager Clinical Goal(s):  [name] Over the next 10 days, patient will have an in person office visit with PCP to review recent falls . Over the next 30 days, patient will reach out to [facility] or PCP with any new or worsening symptoms . Over the next 30 days, patient will demonstrate understanding of fall prevention strategies as evidenced by a decrease in falls  Interventions:  . Inter-disciplinary care team collaboration (see longitudinal plan of care) . Chart reviewed including recent office notes and lab results . Medications reviewed with patient . Discussed falls o Per patient, his legs just feel weak and he  falls.  o He is experiencing dizziness, especially when bending over or standing from seated position o No injuries with falls o Recovers after sitting for a few minutes . Discussed possible causes for falls . Encouraged patient to change positions slowly and to stand for 30 seconds or so after arising from a seated position before he starts to walk . Reviewed upcoming appts o Dr Livia Snellen 02/06/20 . Encouraged patient to reach out to PCP or RNCM with any new symptoms and to call if he experiences any additional falls . Encouraged patient to keep cell phone with him at all times  Patient Self Care Activities:  . Performs ADL's independently . Does not drive and has assistance from sister and [name] . Takes prepackaged medications as directed . Calls PCP with any medical concerns  Please see past updates related to this goal by clicking on the "Past Updates" button in the selected goal      .  "I want to make sure my blood pressure stays under control" (pt-stated)  Current Barriers:  [name] Knowledge Deficits related to blood pressure readings and management . Lacks caregiver support.   . Cognitive deficits . illiterate  Nurse Case Manager Clinical Goal(s):   Over the next 30 days, patient will continue to take medications as prescribed  Over the next 30 days, patient will continue to receive prepackaged weekly medications from [facility]  Over the next 30 days, Patient will check and record his blood pressure daily  Over the next 30 days, patient will work with Consulting civil engineer and PCP top maintain BP within provider recommended parameters  Interventions:   Reviewed chart and previous notes  Discussed home blood pressure readings but reports they have been good  Asked patient to continue checking and recording blood pressure daily.  Asked patient to bring readings with him to next PCP appt  Discussed dizziness/lightheadedness upon standing and bending  Discussed  falls  Asked patient to call our office at [phone] to report any readings outside of the provider recommended ranges  Asked patient to call our office at [phone] with any new or worsening symptoms  Reviewed upcoming appts  Dr Livia Snellen 02/06/20  Patient Self Care Activities:   . He is able to take his prepackaged medications on his own . Prepares his own meals . Does not drive but has assistance from sister and [name]  Please see past updates related to this goal by clicking on the "Past Updates" button in the selected goal           Plan:   The care management team will reach out to the patient again over the next 30 days.  Next PCP appointment scheduled for: 02/06/20   [name], BSN, RN-BC [place] / [place] Management Direct Dial: [phone]

## 2020-01-26 NOTE — Telephone Encounter (Signed)
01/26/2020  Spoke with patient by telephone today. He reports an increase in weakness, falls, dizziness, decreased appetite with weight loss, and nausea when eating. Appt scheduled with Dr Livia Snellen on 02/06/20 to evaluate this change.   As for the PCS forms, he has had an increase in number of falls and is experiencing an increase in weakness which decreases his ability to care for himself properly. I believe "beneficiary requires an increased level of supervision" and "Beneficiary has a history of safety concerns" is appropriate because of the increase in falls. This is the only change that I found that may justify an increase in hours. Visit on 02/06/20 may provide more information.     [name], BSN, RN-BC Embedded Chronic Care Manager [facility] / [place] [facility] Direct Dial: [phone]

## 2020-01-26 NOTE — Patient Instructions (Signed)
Visit Information  Goals Addressed              This Visit's Progress     Patient Stated   .  "I need my Aide to get more hours" (pt-stated)        [place] (see longitudinal plan of care for additional care plan information)  Current Barriers:  . Care Coordination needs related to in-home care services in a patient with hypertension, hx of MI, infantile cerebral palsy, and depression . Literacy barriers . Transportation barriers . Cognitive Deficits  Nurse Case Manager Clinical Goal(s):  [name] Over the next 10 days, patient will meet with PCP to discuss medical changes that necessitate an increase in PCS hours  Interventions:  . Inter-disciplinary care team collaboration (see longitudinal plan of care) . Chart reviewed . Collaborated with [name], [name] . Collaborated with PCP, Dr Livia Snellen regarding patient's request for an increase in Aide hours . Talked with patient by telephone . Aide currently comes 3 hours a day Mon-Fri o Helps with general household needs and meal prep . Discussed safety concerns around dizziness and falls and weight loss due to decrease in appetite . Reviewed and discussed medications . Collaborated with [facility] triage nurse to schedule visit with Dr Livia Snellen on 02/06/20 . [name] transportation will be arranged by [name]  Patient Self Care Activities:  . Calls provider office for new concerns or questions . Takes prepackaged medications as prescribed . Unable to independently drive or complete all ADLs and IADLs independently   Initial goal documentation     .  "I want to find out why I'm falling" (pt-stated)        CARE PLAN ENTRY (see longitudinal plan of care for additional care plan information)  Current Barriers:  . Care Coordination needs related to new onset of recurrent falls in a patient with infantile cerebral palsy, HTN, and COPD (disease states) . Cognitive Deficits  Nurse Case Manager Clinical Goal(s):  [name] Over the next 10 days,  patient will have an in person office visit with PCP to review recent falls . Over the next 30 days, patient will reach out to [facility] or PCP with any new or worsening symptoms . Over the next 30 days, patient will demonstrate understanding of fall prevention strategies as evidenced by a decrease in falls  Interventions:  . Inter-disciplinary care team collaboration (see longitudinal plan of care) . Chart reviewed including recent office notes and lab results . Medications reviewed with patient . Discussed falls o Per patient, his legs just feel weak and he falls.  o He is experiencing dizziness, especially when bending over or standing from seated position o No injuries with falls o Recovers after sitting for a few minutes . Discussed possible causes for falls . Encouraged patient to change positions slowly and to stand for 30 seconds or so after arising from a seated position before he starts to walk . Reviewed upcoming appts o Dr Livia Snellen 02/06/20 . Encouraged patient to reach out to PCP or RNCM with any new symptoms and to call if he experiences any additional falls . Encouraged patient to keep cell phone with him at all times  Patient Self Care Activities:  . Performs ADL's independently . Does not drive and has assistance from sister and [name] . Takes prepackaged medications as directed . Calls PCP with any medical concerns  Please see past updates related to this goal by clicking on the "Past Updates" button in the selected goal      .  COMPLETED: "I want to lose some of this weight" (pt-stated)        Obesity in patient with HTN, CAD, Prior MI, and COPD  Current Barriers:  . Literacy barriers  . Knowledge deficit related to proper weight management techniques  Nurse Case Manager Clinical Goal(s):  [name] Over the next 30 days, patient will verbalize understanding of plan for weight loss  . Over the next 30 days patient will state understanding of need for weight management and  it's health benefits . Over the next 60 days patient will experience a weight loss of 4 to 8 pounds.    Interventions:   Discussed current diet  Talked with patient by telephone  States, "I am eating like the heart doctor told me to"  Reports that he has lost some weight but unsure of how much. His close fit better.   Recommended portion control  Recommended lean protein, complex carbs (fruits, vegetables, whole grains, nuts)  Decrease simple carbs like white sugar, white bread, candy, soda, etc  Encouraged patient to continue with diet modification  Previously provided with CCM contact information and encouraged to reach out as needed  Patient Self Care Activities:  . Currently UNABLE TO independently perform all ADLs independently . Self administers medications as prescribed (in pill pack) . Calls provider office for new concerns or questions    Please see past updates related to this goal by clicking on the "Past Updates" button in the selected goal        .  COMPLETED: "I want to make sure my aid is coming like she's supposed to" (pt-stated)        Current Barriers:  . Film/video editor.  . Literacy barriers . Cognitive Deficits  Nurse Case Manager Clinical Goal(s):  [name] Over the next 30 days, patient will verify that personal care aid is coming to his home as often as she is supposed to  Interventions:  . Talked with patient about his concerns o Feels that his aid is not coming as often or staying as long as they are supposed to. He is frustrated and feels like discontinuing services all together.  o Has reached out to [facility] with the number provided, but has been unable to reach anyone. States that it sounds like it's a disconnected number. Has not asked his sister for assistance with the telephone call.  o Requests that I call [place] and ask them to reach out to him . Reached out to [employer] [phone] on the patient's  behalf . Previously provided patient with CCM contact information and he reaches out as needed  Patient Self Care Activities:  . Currently UNABLE TO independently perform all ADLs or IADls . Calls office with questions  Please see past updates related to this goal by clicking on the "Past Updates" button in the selected goal   Goal Complete: new aid is coming out and he is pleased with her services     .  "I want to make sure my blood pressure stays under control" (pt-stated)        Current Barriers:  [name] Knowledge Deficits related to blood pressure readings and management . Lacks caregiver support.   . Cognitive deficits . illiterate  Nurse Case Manager Clinical Goal(s):   Over the next 30 days, patient will continue to take medications as prescribed  Over the next 30 days, patient will continue to receive prepackaged weekly medications from [facility]  Over the next 30 days, Patient  will check and record his blood pressure daily  Over the next 30 days, patient will work with Consulting civil engineer and PCP top maintain BP within provider recommended parameters  Interventions:   Reviewed chart and previous notes  Discussed home blood pressure readings but reports they have been good  Asked patient to continue checking and recording blood pressure daily.  Asked patient to bring readings with him to next PCP appt  Discussed dizziness/lightheadedness upon standing and bending  Discussed falls  Asked patient to call our office at [phone] to report any readings outside of the provider recommended ranges  Asked patient to call our office at [phone] with any new or worsening symptoms  Reviewed upcoming appts  Dr Livia Snellen 02/06/20  Patient Self Care Activities:   . He is able to take his prepackaged medications on his own . Prepares his own meals . Does not drive but has assistance from sister and [name]  Please see past updates related to this goal by clicking on  the "Past Updates" button in the selected goal       .  COMPLETED: "I've had a heart attack before and I don't want to have another one." (pt-stated)        Current Barriers:  . Cognitive deficits . Illiteracy . Dependence on others for transportation  Nurse Case Manager Clinical Goal(s): Over the next 2 months patient will demonstrate continued desire to improve his health as evidenced by following a heart healthy diet, continuing to be smoke free, and increasing his physical activity.  Interventions:   Reviewed medications with patient  Congratulated patient on 2 weeks of being smoke free and encouraged him to continue to abstain from cigarettes  Reminded him to follow up with Dr Percival Spanish in 1 year and PRN  Reviewed patient's current dietary habits  Education on heart healthy diet and portion sizes discussed  Increase physical activity as tolerated with a goal of 150 minutes of moderate activity a week   Patient Self Care Activities:  . Self administers medications . Has the ability to exercise . He does some physical labor but not consistently . Aware of s/s of heart attack  Please see past updates related to this goal by clicking on the "Past Updates" button in the selected goal       .  COMPLETED: Care Manager Goal: Patient will begin to identify acute care needs vs chronic care needs and will start to identify appropriate first steps in treatment. (pt-stated)        Current Barriers:  . Financial Constraints.  . Literacy barriers . Transportation barriers . Cognitive Deficits  Nurse Case Manager Clinical Goal(s):  [name] Over the next 60 days, patient will work with Consulting civil engineer to address needs related to acute and chronic medical conditions.  . Over the next 7 days, patient will attend all scheduled medical appointments: Dr Livia Snellen, 10/31/18 . Over the next 14 days, patient will experience relief from wrist pain and will verbalize understanding of acute pain  management strategies  Interventions:  . Provided praise for arranging [name] transportation to his upcoming appointment on his own . Discussed with [name], [name] who did verify with [name] that Mr Ellithorpe is scheduled for pickup at 2:00 on 7/13  Patient Self Care Activities:  . Currently UNABLE TO independently drive  Please see past updates related to this goal by clicking on the "Past Updates" button in the selected goal      .  COMPLETED: Improve  Insufficient Self Care Secondary to Grief (pt-stated)        Current Barriers:  [name] Grief reaction to sister's death in patient with Chronic Diagnoses of COPD, Apaxia of Speech, CAD, HTN, GAD, DDD, Depression . depression  Nurse Case Manager Clinical Goal(s):  [name] Over the next 30 days, patient will continue to talk with [name] regarding grief secondary to sister's death . Over the next 30 days, patient will work with RN to improve oral intake in presence of decreased appetite secondary to grief process  Interventions:  . Encouraged patient to continue spending time with sister . Encouraged patient to recognize that healing will take time . Encouraged patient to get back into doing regularly activities at a pace that is comfortable for him . Advised to reach out to [facility] team as needed  Patient Self Care Activities:  . Performs ADL's independently . Unable to perform IADLs independently. Has not have transportation arranged.   Initial goal documentation        Patient verbalizes understanding of instructions provided today.   Follow-up Plan  The care management team will reach out to the patient again over the next 30 days.  Next PCP appointment scheduled for: 02/06/20   [name], BSN, RN-BC [place] / [place] Management Direct Dial: [phone]

## 2020-01-29 NOTE — Telephone Encounter (Signed)
Thanks. I would be happy to see him sooner, perhaps on my acute day, if his symptoms are progressing.

## 2020-01-30 ENCOUNTER — Telehealth: Payer: Self-pay

## 2020-01-30 NOTE — Telephone Encounter (Signed)
LMOVM that form was faxed today to [facility]

## 2020-01-30 NOTE — Telephone Encounter (Signed)
Form faxed to [employer]

## 2020-02-01 ENCOUNTER — Ambulatory Visit: Payer: Self-pay | Admitting: Licensed Clinical Social Worker

## 2020-02-01 DIAGNOSIS — I10 Essential (primary) hypertension: Secondary | ICD-10-CM | POA: Diagnosis not present

## 2020-02-01 DIAGNOSIS — F3341 Major depressive disorder, recurrent, in partial remission: Secondary | ICD-10-CM

## 2020-02-01 DIAGNOSIS — K219 Gastro-esophageal reflux disease without esophagitis: Secondary | ICD-10-CM

## 2020-02-01 DIAGNOSIS — I25118 Atherosclerotic heart disease of native coronary artery with other forms of angina pectoris: Secondary | ICD-10-CM

## 2020-02-01 DIAGNOSIS — J441 Chronic obstructive pulmonary disease with (acute) exacerbation: Secondary | ICD-10-CM

## 2020-02-01 DIAGNOSIS — I252 Old myocardial infarction: Secondary | ICD-10-CM

## 2020-02-01 DIAGNOSIS — R482 Apraxia: Secondary | ICD-10-CM

## 2020-02-01 DIAGNOSIS — E538 Deficiency of other specified B group vitamins: Secondary | ICD-10-CM

## 2020-02-01 DIAGNOSIS — G809 Cerebral palsy, unspecified: Secondary | ICD-10-CM

## 2020-02-01 DIAGNOSIS — M51369 Other intervertebral disc degeneration, lumbar region without mention of lumbar back pain or lower extremity pain: Secondary | ICD-10-CM

## 2020-02-01 DIAGNOSIS — F411 Generalized anxiety disorder: Secondary | ICD-10-CM

## 2020-02-01 DIAGNOSIS — M5136 Other intervertebral disc degeneration, lumbar region: Secondary | ICD-10-CM

## 2020-02-01 NOTE — Patient Instructions (Addendum)
Licensed Clinical Social Worker Visit Information  Goals we discussed today:   Current Barriers:   Film/video editor.   Literacy barriers  Transportation barriers in client with Chronic Diagnoses of DDD, Depressin Apraxia of Speech, CAD GAD, Hx MI, HTN, Infantile Cerebral Palsy   Cognitive Deficits  Nurse Case Manager Clinical Goal(s):  Over the next 7 days, patient will have transportation arranged for B12 injection that is scheduled for 02/06/2020 at 11:30 AM at [facility]   Interventions:   [name] contacted [name] representative today and confirmed client transport arrangements with [employer] for 02/06/2020 11:30 AM appointment for client at [place] client today and informed client of transport arrangements for client for 02/06/2020 appointment at [facility]   Patient Self Care Activities:   Currently UNABLE TO independently drive to medical appointments.   Please see past updates related to this goal by clicking on the "Past Updates" button in the selected goal        Follow Up Plan:[name] to call client in next 4 weeks to talk with client about social work needs of client at that time  Materials Provided: No  The patient verbalized understanding of instructions provided today and declined a print copy of patient instruction materials.   [name].Jonn Chaikin MSW, [name] Licensed Clinical Social Worker [facility] Care Management [phone]

## 2020-02-01 NOTE — Chronic Care Management (AMB) (Signed)
Chronic Care Management    Clinical Social Work Follow Up Note  02/01/2020 Name: [name] MRN: [id number] DOB: 1956-03-23  [name] AGE is a 64 y.o. year old male who is a primary care patient of [name], [name], MD. The CCM team was consulted for assistance with [name] .   Review of patient status, including review of consultants reports, other relevant assessments, and collaboration with appropriate care team members and the patient's provider was performed as part of comprehensive patient evaluation and provision of chronic care management services.    SDOH (Social Determinants of Health) assessments performed: No;risk for tobacco use; risk for depression; risk for stress; risk for physical inactivity; risk for social isolation;risk for transport needs    Chronic Care Management from 02/21/2019 in [place]  PHQ-9 Total Score 9       GAD 7 : Generalized Anxiety Score 04/10/2019 05/01/2016 03/31/2016  Nervous, Anxious, on Edge 0 1 3  Control/stop worrying 0 1 3  Worry too much - different things 0 1 3  Trouble relaxing 0 0 2  Restless 0 0 2  Easily annoyed or irritable 0 0 0  Afraid - awful might happen 0 0 0  Total GAD 7 Score 0 3 13  Anxiety Difficulty Not difficult at all Not difficult at all Not difficult at all    Outpatient Encounter Medications as of 02/01/2020  Medication Sig  . acetaminophen (TYLENOL) 325 MG tablet Take 2 tablets (650 mg total) by mouth every 6 (six) hours as needed for mild pain or headache.  . albuterol (PROAIR HFA) 108 (90 Base) MCG/ACT inhaler 1-2 puffs every 6 hours as needed wheezing or shortness of breath.  [name] aspirin EC 81 MG tablet Take 1 tablet (81 mg total) by mouth daily.  [name] atorvastatin (LIPITOR) 40 MG tablet TAKE 1 TABLET ONCE DAILY AT 6PM  . buPROPion (WELLBUTRIN SR) 150 MG 12 hr tablet Take 1 tablet (150 mg total) by mouth daily with breakfast.  . butalbital-acetaminophen-caffeine (ESGIC)  50-325-40 MG tablet Take 1 tablet by mouth every 6 (six) hours as needed for headache.  . celecoxib (CELEBREX) 200 MG capsule Take 1 capsule (200 mg total) by mouth daily as needed.  . cetaphil (CETAPHIL) lotion Apply 1 application topically 2 (two) times daily. For rash. Apply after washing with [facility] shower gel and rinsing with clear warm water.  . DULoxetine (CYMBALTA) 60 MG capsule Take 1 capsule (60 mg total) by mouth daily. WITH A FULL STOMACH AT SUPPER TIME  . fexofenadine (ALLEGRA) 180 MG tablet Take 1 tablet (180 mg total) by mouth daily. For allergy symptoms  . fluocinonide-emollient (LIDEX-E) 0.05 % cream Apply 1 application topically 2 (two) times daily. To affected areas  . fluticasone (FLONASE) 50 MCG/ACT nasal spray Place 2 sprays into both nostrils daily as needed for allergies or rhinitis.  . fluticasone furoate-vilanterol (BREO ELLIPTA) 100-25 MCG/INH AEPB Inhale 1 puff into the lungs daily.  . Ipratropium-Albuterol (COMBIVENT RESPIMAT) 20-100 MCG/ACT AERS respimat Inhale 1 puff into the lungs every 6 (six) hours  . isosorbide mononitrate (IMDUR) 60 MG 24 hr tablet Take 1 tablet (60 mg total) by mouth daily.  . metoprolol tartrate (LOPRESSOR) 25 MG tablet Take 1 tablet (25 mg total) by mouth 2 (two) times daily.  . mirtazapine (REMERON) 15 MG tablet Take 1 tablet (15 mg total) by mouth at bedtime. For sleep  . nitroGLYCERIN (NITROSTAT) 0.4 MG SL tablet Place 1 tablet (0.4 mg total)  under the tongue every 5 (five) minutes x 3 doses as needed for chest pain.  . pantoprazole (PROTONIX) 40 MG tablet TAKE  (1)  TABLET TWICE A DAY.  . tamsulosin (FLOMAX) 0.4 MG CAPS capsule Take 2 capsules (0.8 mg total) by mouth daily after supper.   Facility-Administered Encounter Medications as of 02/01/2020  Medication  . cyanocobalamin ((VITAMIN B-12)) injection 1,000 mcg   Goals      Current Barriers:   Film/video editor.   Literacy barriers  Transportation barriers in client  with Chronic Diagnoses of DDD, Depressin Apraxia of Speech, CAD GAD, Hx MI, HTN, Infantile Cerebral Palsy   Cognitive Deficits  Nurse Case Manager Clinical Goal(s):   Over the next 7 days, patient will have transportation arranged for B12 injection that is scheduled for 02/06/2020 at 11:30 AM at [facility]   Interventions:   [name] contacted [name] representative today and confirmed client transport arrangements with [place] for 02/06/2020 11:30 AM appointment for client at [place] client today and informed client of transport arrangements for client for 02/06/2020 appointment at [facility]   Patient Self Care Activities:   Currently UNABLE TO independently drive to medical appointments.   Please see past updates related to this goal by clicking on the "Past Updates" button in the selected goal         Follow Up Plan: [name] to call client in next 4 weeks to talk with client about social work needs of client at that time  Wellington Winegarden.Onesimo Hale MSW, [name] Licensed Clinical Social Worker [facility] Care [facility] [phone]

## 2020-02-06 ENCOUNTER — Other Ambulatory Visit: Payer: Self-pay

## 2020-02-06 ENCOUNTER — Ambulatory Visit (INDEPENDENT_AMBULATORY_CARE_PROVIDER_SITE_OTHER): Payer: Medicare Other | Admitting: Family Medicine

## 2020-02-06 ENCOUNTER — Encounter: Payer: Self-pay | Admitting: Family Medicine

## 2020-02-06 ENCOUNTER — Ambulatory Visit: Payer: Medicare Other

## 2020-02-06 VITALS — BP 123/71 | HR 75 | Temp 98.0°F | Ht 73.0 in | Wt 255.2 lb

## 2020-02-06 DIAGNOSIS — R112 Nausea with vomiting, unspecified: Secondary | ICD-10-CM | POA: Diagnosis not present

## 2020-02-06 DIAGNOSIS — E538 Deficiency of other specified B group vitamins: Secondary | ICD-10-CM

## 2020-02-06 DIAGNOSIS — G809 Cerebral palsy, unspecified: Secondary | ICD-10-CM

## 2020-02-06 DIAGNOSIS — Z23 Encounter for immunization: Secondary | ICD-10-CM | POA: Diagnosis not present

## 2020-02-06 NOTE — Progress Notes (Signed)
Subjective:  Patient ID: [name], male    DOB: January 09, 1956  Age: 64 y.o. MRN: [id number]  CC: Decrease appetite, Weight Loss, Fatigue, Nausea, and Emesis   HPI [name] presents for concern for weight loss and poor appetite.  This was accompanied by problems over the weekend of nausea and vomiting.  That lasted for couple days only and was self-limited he is now eating normally again without nausea or vomiting.  There was no fever chills sweats and no diarrhea accompanying this syndrome.  His weight that he was concerned about is actually higher than its been in some time.  He describes his diet for me he is oatmeal balogna sandwiches and similar simple to fix items.  He is working on trying with an aide in the home with his diet as well.  He is in no distress today and denies any current symptoms.  See review of systems  Depression screen [facility] 2/9 02/06/2020 08/23/2019 08/16/2019  Decreased Interest 0 0 0  Down, Depressed, Hopeless 0 0 0  PHQ - 2 Score 0 0 0  Altered sleeping - - -  Tired, decreased energy - - -  Change in appetite - - -  Feeling bad or failure about yourself  - - -  Trouble concentrating - - -  Moving slowly or fidgety/restless - - -  Suicidal thoughts - - -  PHQ-9 Score - - -  Difficult doing work/chores - - -  Some recent data might be hidden    History Ej has a past medical history of Cerebral palsy ([place]), Coronary artery disease, Depression, GERD (gastroesophageal reflux disease), Hypertension, Scoliosis, and Seizures ([place]).   He has a past surgical history that includes Coronary angioplasty with stent; Carpal tunnel release (Right, 08/13/2016); and LEFT HEART CATH AND CORONARY ANGIOGRAPHY (N/A, 07/10/2017).   His family history includes Alcohol abuse in his brother; CAD in his brother and sister; CAD (age of onset: 39) in his father; Diabetes in his mother; Heart disease in his father; Heart failure in his sister.He reports that he quit smoking about  20 months ago. His smoking use included cigarettes. He has a 20.50 pack-year smoking history. He has never used smokeless tobacco. He reports current alcohol use. He reports that he does not use drugs.    ROS Review of Systems  Constitutional: Negative.  Negative for fever.  HENT: Negative.   Eyes: Negative for visual disturbance.  Respiratory: Negative for cough and shortness of breath.   Cardiovascular: Negative for chest pain and leg swelling.  Gastrointestinal: Negative for abdominal pain and diarrhea.  Genitourinary: Negative for difficulty urinating.  Musculoskeletal: Negative for arthralgias and myalgias.  Skin: Negative for rash.  Neurological: Negative for headaches.  Psychiatric/Behavioral: Negative for sleep disturbance.    Objective:  BP 123/71   Pulse 75   Temp 98 F (36.7 C) (Temporal)   Ht '6\' 1"'  (1.854 m)   Wt 255 lb 3.2 oz (115.8 kg)   BMI 33.67 kg/m   BP Readings from Last 3 Encounters:  02/06/20 123/71  12/20/19 130/85  11/08/19 123/67    Wt Readings from Last 3 Encounters:  02/06/20 255 lb 3.2 oz (115.8 kg)  12/20/19 248 lb 2 oz (112.5 kg)  11/08/19 246 lb 2 oz (111.6 kg)     Physical Exam Vitals reviewed.  Constitutional:      Appearance: He is well-developed.  HENT:     Head: Normocephalic and atraumatic.     Right Ear:  Tympanic membrane and external ear normal. No decreased hearing noted.     Left Ear: Tympanic membrane and external ear normal. No decreased hearing noted.     Mouth/Throat:     Pharynx: No oropharyngeal exudate or posterior oropharyngeal erythema.  Eyes:     Pupils: Pupils are equal, round, and reactive to light.  Cardiovascular:     Rate and Rhythm: Normal rate and regular rhythm.     Heart sounds: No murmur heard.   Pulmonary:     Effort: No respiratory distress.     Breath sounds: Normal breath sounds.  Abdominal:     General: Bowel sounds are normal.     Palpations: Abdomen is soft. There is no mass.      Tenderness: There is no abdominal tenderness.  Musculoskeletal:     Cervical back: Normal range of motion and neck supple.       Assessment & Plan:   [name] was seen today for decrease appetite, weight loss, fatigue, nausea and emesis.  Diagnoses and all orders for this visit:  Non-intractable vomiting with nausea, unspecified vomiting type -     CBC with Differential/Platelet -     CMP14+EGFR -     Lipase -     Urinalysis, Complete  Infantile cerebral palsy (HCC)  Need for immunization against influenza -     Flu Vaccine QUAD 36+ mos IM       I am having [name] maintain his acetaminophen, cetaphil, aspirin EC, nitroGLYCERIN, butalbital-acetaminophen-caffeine, albuterol, atorvastatin, buPROPion, celecoxib, DULoxetine, fexofenadine, fluocinonide-emollient, fluticasone, Breo Ellipta, Combivent Respimat, isosorbide mononitrate, metoprolol tartrate, mirtazapine, pantoprazole, and tamsulosin. We administered cyanocobalamin. We will continue to administer cyanocobalamin.  Allergies as of 02/06/2020      Reactions   Chantix [varenicline Tartrate] Nausea And Vomiting      Medication List       Accurate as of February 06, 2020 10:25 PM. If you have any questions, ask your nurse or doctor.        acetaminophen 325 MG tablet Commonly known as: TYLENOL Take 2 tablets (650 mg total) by mouth every 6 (six) hours as needed for mild pain or headache.   albuterol 108 (90 Base) MCG/ACT inhaler Commonly known as: ProAir HFA 1-2 puffs every 6 hours as needed wheezing or shortness of breath.   aspirin EC 81 MG tablet Take 1 tablet (81 mg total) by mouth daily.   atorvastatin 40 MG tablet Commonly known as: LIPITOR TAKE 1 TABLET ONCE DAILY AT 6PM   Breo Ellipta 100-25 MCG/INH Aepb Generic drug: fluticasone furoate-vilanterol Inhale 1 puff into the lungs daily.   buPROPion 150 MG 12 hr tablet Commonly known as: WELLBUTRIN SR Take 1 tablet (150 mg total) by mouth  daily with breakfast.   butalbital-acetaminophen-caffeine 50-325-40 MG tablet Commonly known as: Esgic Take 1 tablet by mouth every 6 (six) hours as needed for headache.   celecoxib 200 MG capsule Commonly known as: CELEBREX Take 1 capsule (200 mg total) by mouth daily as needed.   cetaphil lotion Apply 1 application topically 2 (two) times daily. For rash. Apply after washing with [facility] shower gel and rinsing with clear warm water.   Combivent Respimat 20-100 MCG/ACT Aers respimat Generic drug: Ipratropium-Albuterol Inhale 1 puff into the lungs every 6 (six) hours   DULoxetine 60 MG capsule Commonly known as: CYMBALTA Take 1 capsule (60 mg total) by mouth daily. WITH A FULL STOMACH AT SUPPER TIME   fexofenadine 180 MG tablet Commonly known  as: ALLEGRA Take 1 tablet (180 mg total) by mouth daily. For allergy symptoms   fluocinonide-emollient 0.05 % cream Commonly known as: LIDEX-E Apply 1 application topically 2 (two) times daily. To affected areas   fluticasone 50 MCG/ACT nasal spray Commonly known as: FLONASE Place 2 sprays into both nostrils daily as needed for allergies or rhinitis.   isosorbide mononitrate 60 MG 24 hr tablet Commonly known as: IMDUR Take 1 tablet (60 mg total) by mouth daily.   metoprolol tartrate 25 MG tablet Commonly known as: LOPRESSOR Take 1 tablet (25 mg total) by mouth 2 (two) times daily.   mirtazapine 15 MG tablet Commonly known as: Remeron Take 1 tablet (15 mg total) by mouth at bedtime. For sleep   nitroGLYCERIN 0.4 MG SL tablet Commonly known as: NITROSTAT Place 1 tablet (0.4 mg total) under the tongue every 5 (five) minutes x 3 doses as needed for chest pain.   pantoprazole 40 MG tablet Commonly known as: PROTONIX TAKE  (1)  TABLET TWICE A DAY.   tamsulosin 0.4 MG Caps capsule Commonly known as: FLOMAX Take 2 capsules (0.8 mg total) by mouth daily after supper.      Patient lives on his own and has some supervision  from family and support from chronic care management.  However due to his CP it is very difficult to gauge his symptoms based on his history.  At this time it appears that his symptoms were self-limited.  However we will continue to monitor and like to do some blood work today to just check for further problems.  CCM will be checking on him soon.  Hopefully we can give him more assistance in the home as well.  Follow-up: Return in about 3 months (around 05/08/2020), or if symptoms worsen or fail to improve.  [name], M.D.

## 2020-02-07 LAB — LIPASE: Lipase: 19 U/L (ref 13–78)

## 2020-02-07 LAB — CBC WITH DIFFERENTIAL/PLATELET
Basophils Absolute: 0.1 10*3/uL (ref 0.0–0.2)
Basos: 1 %
EOS (ABSOLUTE): 1.5 10*3/uL — ABNORMAL HIGH (ref 0.0–0.4)
Eos: 12 %
Hematocrit: 42 % (ref 37.5–51.0)
Hemoglobin: 14.7 g/dL (ref 13.0–17.7)
Immature Grans (Abs): 0.1 10*3/uL (ref 0.0–0.1)
Immature Granulocytes: 1 %
Lymphocytes Absolute: 3.7 10*3/uL — ABNORMAL HIGH (ref 0.7–3.1)
Lymphs: 32 %
MCH: 34.4 pg — ABNORMAL HIGH (ref 26.6–33.0)
MCHC: 35 g/dL (ref 31.5–35.7)
MCV: 98 fL — ABNORMAL HIGH (ref 79–97)
Monocytes Absolute: 0.7 10*3/uL (ref 0.1–0.9)
Monocytes: 6 %
Neutrophils Absolute: 5.7 10*3/uL (ref 1.4–7.0)
Neutrophils: 48 %
Platelets: 218 10*3/uL (ref 150–450)
RBC: 4.27 x10E6/uL (ref 4.14–5.80)
RDW: 12.8 % (ref 11.6–15.4)
WBC: 11.7 10*3/uL — ABNORMAL HIGH (ref 3.4–10.8)

## 2020-02-07 LAB — CMP14+EGFR
ALT: 14 IU/L (ref 0–44)
AST: 16 IU/L (ref 0–40)
Albumin/Globulin Ratio: 1.4 (ref 1.2–2.2)
Albumin: 4.1 g/dL (ref 3.8–4.8)
Alkaline Phosphatase: 66 IU/L (ref 44–121)
BUN/Creatinine Ratio: 15 (ref 10–24)
BUN: 11 mg/dL (ref 8–27)
Bilirubin Total: 0.8 mg/dL (ref 0.0–1.2)
CO2: 32 mmol/L — ABNORMAL HIGH (ref 20–29)
Calcium: 9.3 mg/dL (ref 8.6–10.2)
Chloride: 97 mmol/L (ref 96–106)
Creatinine, Ser: 0.75 mg/dL — ABNORMAL LOW (ref 0.76–1.27)
GFR calc Af Amer: 114 mL/min/{1.73_m2} (ref >59)
GFR calc non Af Amer: 98 mL/min/{1.73_m2} (ref >59)
Globulin, Total: 2.9 g/dL (ref 1.5–4.5)
Glucose: 74 mg/dL (ref 65–99)
Potassium: 4.5 mmol/L (ref 3.5–5.2)
Sodium: 140 mmol/L (ref 134–144)
Total Protein: 7 g/dL (ref 6.0–8.5)

## 2020-02-07 NOTE — Progress Notes (Signed)
Hello [name],  Your lab result is normal and/or stable.Some minor variations that are not significant are commonly marked abnormal, but do not represent any medical problem for you.  Best regards, [name], M.D.

## 2020-02-20 ENCOUNTER — Ambulatory Visit: Payer: Medicare Other | Admitting: Family Medicine

## 2020-02-21 ENCOUNTER — Ambulatory Visit: Payer: Self-pay | Admitting: Licensed Clinical Social Worker

## 2020-02-21 ENCOUNTER — Telehealth: Payer: Medicare Other

## 2020-02-21 ENCOUNTER — Other Ambulatory Visit: Payer: Self-pay | Admitting: Family Medicine

## 2020-02-21 DIAGNOSIS — R531 Weakness: Secondary | ICD-10-CM

## 2020-02-21 DIAGNOSIS — M51369 Other intervertebral disc degeneration, lumbar region without mention of lumbar back pain or lower extremity pain: Secondary | ICD-10-CM

## 2020-02-21 DIAGNOSIS — F3341 Major depressive disorder, recurrent, in partial remission: Secondary | ICD-10-CM

## 2020-02-21 DIAGNOSIS — I252 Old myocardial infarction: Secondary | ICD-10-CM

## 2020-02-21 DIAGNOSIS — G809 Cerebral palsy, unspecified: Secondary | ICD-10-CM

## 2020-02-21 DIAGNOSIS — J441 Chronic obstructive pulmonary disease with (acute) exacerbation: Secondary | ICD-10-CM

## 2020-02-21 DIAGNOSIS — I25118 Atherosclerotic heart disease of native coronary artery with other forms of angina pectoris: Secondary | ICD-10-CM

## 2020-02-21 DIAGNOSIS — R482 Apraxia: Secondary | ICD-10-CM

## 2020-02-21 DIAGNOSIS — F411 Generalized anxiety disorder: Secondary | ICD-10-CM

## 2020-02-21 DIAGNOSIS — M5136 Other intervertebral disc degeneration, lumbar region: Secondary | ICD-10-CM

## 2020-02-21 DIAGNOSIS — E538 Deficiency of other specified B group vitamins: Secondary | ICD-10-CM

## 2020-02-21 DIAGNOSIS — K219 Gastro-esophageal reflux disease without esophagitis: Secondary | ICD-10-CM

## 2020-02-21 NOTE — Telephone Encounter (Signed)
Patient will call back in the morning to let us know where to send it.

## 2020-02-21 NOTE — Chronic Care Management (AMB) (Signed)
Chronic Care Management    Clinical Social Work Follow Up Note  02/21/2020 Name: RHYLEN SHAHEEN MRN: [id number] DOB: December 18, 1955  DEVYNN SCHEFF is a 64 y.o. year old male who is a primary care patient of [name], [name], MD. The CCM team was consulted for assistance with [name] .   Review of patient status, including review of consultants reports, other relevant assessments, and collaboration with appropriate care team members and the patient's provider was performed as part of comprehensive patient evaluation and provision of chronic care management services.    SDOH (Social Determinants of Health) assessments performed: No; risk for depression; risk for stress; risk for financial strain; risk for transport needs; risk for tobacco use    Chronic Care Management from 02/21/2019 in [place]  PHQ-9 Total Score 9       GAD 7 : Generalized Anxiety Score 04/10/2019 05/01/2016 03/31/2016  Nervous, Anxious, on Edge 0 1 3  Control/stop worrying 0 1 3  Worry too much - different things 0 1 3  Trouble relaxing 0 0 2  Restless 0 0 2  Easily annoyed or irritable 0 0 0  Afraid - awful might happen 0 0 0  Total GAD 7 Score 0 3 13  Anxiety Difficulty Not difficult at all Not difficult at all Not difficult at all    Outpatient Encounter Medications as of 02/21/2020  Medication Sig  . acetaminophen (TYLENOL) 325 MG tablet Take 2 tablets (650 mg total) by mouth every 6 (six) hours as needed for mild pain or headache.  . albuterol (PROAIR HFA) 108 (90 Base) MCG/ACT inhaler 1-2 puffs every 6 hours as needed wheezing or shortness of breath.  [name] aspirin EC 81 MG tablet Take 1 tablet (81 mg total) by mouth daily.  [name] atorvastatin (LIPITOR) 40 MG tablet TAKE 1 TABLET ONCE DAILY AT 6PM  . buPROPion (WELLBUTRIN SR) 150 MG 12 hr tablet Take 1 tablet (150 mg total) by mouth daily with breakfast.  . butalbital-acetaminophen-caffeine (ESGIC) 50-325-40 MG tablet Take 1 tablet by  mouth every 6 (six) hours as needed for headache.  . celecoxib (CELEBREX) 200 MG capsule Take 1 capsule (200 mg total) by mouth daily as needed.  . cetaphil (CETAPHIL) lotion Apply 1 application topically 2 (two) times daily. For rash. Apply after washing with [facility] shower gel and rinsing with clear warm water.  . DULoxetine (CYMBALTA) 60 MG capsule Take 1 capsule (60 mg total) by mouth daily. WITH A FULL STOMACH AT SUPPER TIME  . fexofenadine (ALLEGRA) 180 MG tablet Take 1 tablet (180 mg total) by mouth daily. For allergy symptoms  . fluocinonide-emollient (LIDEX-E) 0.05 % cream Apply 1 application topically 2 (two) times daily. To affected areas  . fluticasone (FLONASE) 50 MCG/ACT nasal spray Place 2 sprays into both nostrils daily as needed for allergies or rhinitis.  . fluticasone furoate-vilanterol (BREO ELLIPTA) 100-25 MCG/INH AEPB Inhale 1 puff into the lungs daily.  . Ipratropium-Albuterol (COMBIVENT RESPIMAT) 20-100 MCG/ACT AERS respimat Inhale 1 puff into the lungs every 6 (six) hours  . isosorbide mononitrate (IMDUR) 60 MG 24 hr tablet Take 1 tablet (60 mg total) by mouth daily.  . metoprolol tartrate (LOPRESSOR) 25 MG tablet Take 1 tablet (25 mg total) by mouth 2 (two) times daily.  . mirtazapine (REMERON) 15 MG tablet Take 1 tablet (15 mg total) by mouth at bedtime. For sleep  . nitroGLYCERIN (NITROSTAT) 0.4 MG SL tablet Place 1 tablet (0.4 mg total) under the  tongue every 5 (five) minutes x 3 doses as needed for chest pain.  . pantoprazole (PROTONIX) 40 MG tablet TAKE  (1)  TABLET TWICE A DAY.  . tamsulosin (FLOMAX) 0.4 MG CAPS capsule Take 2 capsules (0.8 mg total) by mouth daily after supper.   Facility-Administered Encounter Medications as of 02/21/2020  Medication  . cyanocobalamin ((VITAMIN B-12)) injection 1,000 mcg     Goals    . Improve Insufficient Self Care Secondary to Grief (pt-stated)         Current Barriers:   Grief reaction to sister's death in  patient with Chronic Diagnoses of COPD, Apaxia of Speech, CAD, HTN, GAD, DDD, Depression  depression  Nurse Case Manager Clinical Goal(s):  Over the next 30 days, patient will continue to talk with [name] regarding grief secondary to sister's death  Over the next 30 days, patient will work with RN to improve oral intake in presence of decreased appetite secondary to grief process  Interventions:  LCSWtalked with client previously about grief issues related to death of his older sister Talked with client about pain issues of client Encouraged client to talk with RNCM about nursing needs of client Talked with client about social support network (has support from his younger sister) Talked with client about dental needs of client Talked with client about his upcoming medical appointments  Patient Self Care Activities:   Performs ADL's independently  Unable to perform IADLs independently. Has not have transportation arranged.   Initial goal documentation    Follow Up Plan: [name] to call client in next 4 weeks to discuss social work needs of client at that time  Grove Defina.Camryn Lampson MSW, [name] Licensed Clinical Social Worker [facility] Care [facility] [phone]

## 2020-02-21 NOTE — Telephone Encounter (Signed)
I wrote the scrip. Where does he want it sent?

## 2020-02-21 NOTE — Patient Instructions (Addendum)
Licensed Clinical Social Worker Visit Information  Goals we discussed today:  . Improve Insufficient Self Care Secondary to Grief (pt-stated)         Current Barriers:   Grief reaction to sister's death in patient with Chronic Diagnoses of COPD, Apaxia of Speech, CAD, HTN, GAD, DDD, Depression  depression  Nurse Case Manager Clinical Goal(s):  Over the next 30 days, patient will continue to talk with [name] regarding grief secondary to sister's death  Over the next 30 days, patient will work with RN to improve oral intake in presence of decreased appetite secondary to grief process  Interventions:  LCSWtalked with client previously about grief issues related to death of his older sister Talked with client about pain issues of client Encouraged client to talk with RNCM about nursing needs of client Talked with client about social support network (has support from his younger sister) Talked with client about dental needs of client Talked with client about his upcoming medical appointments  Patient Self Care Activities:   Performs ADL's independently  Unable to perform IADLs independently. Has not have transportation arranged.   Initial goal documentation    Follow Up Plan: [name] to call client in next 4 weeks to discuss social work needs of client at that time  Materials Provided: No  The patient verbalized understanding of instructions provided today and declined a print copy of patient instruction materials.   [name].Mardie Kellen MSW, [name] Licensed Clinical Social Worker [facility] [phone]

## 2020-02-21 NOTE — Telephone Encounter (Signed)
Pt called requesting that Dr Livia Snellen write him an order to get a new lift chair.  Please call pt to let him know if Dr Livia Snellen is able to do so.

## 2020-02-22 ENCOUNTER — Ambulatory Visit: Payer: Medicare Other | Admitting: *Deleted

## 2020-02-22 DIAGNOSIS — I25118 Atherosclerotic heart disease of native coronary artery with other forms of angina pectoris: Secondary | ICD-10-CM

## 2020-02-22 DIAGNOSIS — R531 Weakness: Secondary | ICD-10-CM

## 2020-02-22 DIAGNOSIS — I10 Essential (primary) hypertension: Secondary | ICD-10-CM

## 2020-02-22 DIAGNOSIS — G809 Cerebral palsy, unspecified: Secondary | ICD-10-CM

## 2020-02-23 ENCOUNTER — Ambulatory Visit: Payer: Self-pay | Admitting: Licensed Clinical Social Worker

## 2020-02-23 ENCOUNTER — Other Ambulatory Visit: Payer: Self-pay | Admitting: *Deleted

## 2020-02-23 DIAGNOSIS — I25118 Atherosclerotic heart disease of native coronary artery with other forms of angina pectoris: Secondary | ICD-10-CM

## 2020-02-23 DIAGNOSIS — I252 Old myocardial infarction: Secondary | ICD-10-CM

## 2020-02-23 DIAGNOSIS — K219 Gastro-esophageal reflux disease without esophagitis: Secondary | ICD-10-CM

## 2020-02-23 DIAGNOSIS — G809 Cerebral palsy, unspecified: Secondary | ICD-10-CM

## 2020-02-23 DIAGNOSIS — J441 Chronic obstructive pulmonary disease with (acute) exacerbation: Secondary | ICD-10-CM

## 2020-02-23 DIAGNOSIS — R531 Weakness: Secondary | ICD-10-CM

## 2020-02-23 DIAGNOSIS — F411 Generalized anxiety disorder: Secondary | ICD-10-CM

## 2020-02-23 DIAGNOSIS — F3341 Major depressive disorder, recurrent, in partial remission: Secondary | ICD-10-CM

## 2020-02-23 DIAGNOSIS — M51369 Other intervertebral disc degeneration, lumbar region without mention of lumbar back pain or lower extremity pain: Secondary | ICD-10-CM

## 2020-02-23 DIAGNOSIS — E538 Deficiency of other specified B group vitamins: Secondary | ICD-10-CM

## 2020-02-23 DIAGNOSIS — R482 Apraxia: Secondary | ICD-10-CM

## 2020-02-23 DIAGNOSIS — M5136 Other intervertebral disc degeneration, lumbar region: Secondary | ICD-10-CM

## 2020-02-23 NOTE — Patient Instructions (Addendum)
Licensed Clinical Social Worker Visit Information  Materials Provided: No  02/23/2020  Name: [name]          MRN: [id number]       DOB: [birth year]  [name] is a 64 y.o. year old male who is a primary care patient of [name], [name], MD. The CCM team was consulted for assistance with [name] .   Review of patient status, including review of consultants reports, other relevant assessments, and collaboration with appropriate care team members and the patient's provider was performed as part of comprehensive patient evaluation and provision of chronic care management services.    SDOH (Social Determinants of Health) assessments performed: No; risk for depression; risk for tobacco use;risk for transport needs; risk for social isolation; risk for stress  [name] received a phone call today from client. [name] spoke via phone with client today. Client asked about his lift chair prescription and asked about delivery of lift chair. [name] informed client that [name] would send this request to [facility] today and let [name] research issues client asked about and would ask for [name] to call client today. Client agreed to this plan.  Follow Up Plan:  [name] to call client in next 4 weeks to talk with client about the social work needs of client at that time  The patient verbalized understanding of instructions provided today and declined a print copy of patient instruction materials.   [name].Deshante Cassell MSW, [name] Licensed Clinical Social Worker [facility] [phone]

## 2020-02-23 NOTE — Chronic Care Management (AMB) (Signed)
Chronic Care Management    Clinical Social Work Follow Up Note  02/23/2020 Name: [name] MRN: [id number] DOB: [birth year]  [name] is a 63 y.o. year old male who is a primary care patient of [name], [name], MD. The CCM team was consulted for assistance with [name] .   Review of patient status, including review of consultants reports, other relevant assessments, and collaboration with appropriate care team members and the patient's provider was performed as part of comprehensive patient evaluation and provision of chronic care management services.    SDOH (Social Determinants of Health) assessments performed: No; risk for depression; risk for tobacco use;risk for transport needs; risk for social isolation; risk for stress    Chronic Care Management from 02/21/2019 in [place]  PHQ-9 Total Score 9       GAD 7 : Generalized Anxiety Score 04/10/2019 05/01/2016 03/31/2016  Nervous, Anxious, on Edge 0 1 3  Control/stop worrying 0 1 3  Worry too much - different things 0 1 3  Trouble relaxing 0 0 2  Restless 0 0 2  Easily annoyed or irritable 0 0 0  Afraid - awful might happen 0 0 0  Total GAD 7 Score 0 3 13  Anxiety Difficulty Not difficult at all Not difficult at all Not difficult at all    Outpatient Encounter Medications as of 02/23/2020  Medication Sig  . acetaminophen (TYLENOL) 325 MG tablet Take 2 tablets (650 mg total) by mouth every 6 (six) hours as needed for mild pain or headache.  . albuterol (PROAIR HFA) 108 (90 Base) MCG/ACT inhaler 1-2 puffs every 6 hours as needed wheezing or shortness of breath.  [name] aspirin EC 81 MG tablet Take 1 tablet (81 mg total) by mouth daily.  [name] atorvastatin (LIPITOR) 40 MG tablet TAKE 1 TABLET ONCE DAILY AT 6PM  . buPROPion (WELLBUTRIN SR) 150 MG 12 hr tablet Take 1 tablet (150 mg total) by mouth daily with breakfast.  . butalbital-acetaminophen-caffeine (ESGIC) 50-325-40 MG tablet Take 1 tablet by  mouth every 6 (six) hours as needed for headache.  . celecoxib (CELEBREX) 200 MG capsule Take 1 capsule (200 mg total) by mouth daily as needed.  . cetaphil (CETAPHIL) lotion Apply 1 application topically 2 (two) times daily. For rash. Apply after washing with [facility] shower gel and rinsing with clear warm water.  . DULoxetine (CYMBALTA) 60 MG capsule Take 1 capsule (60 mg total) by mouth daily. WITH A FULL STOMACH AT SUPPER TIME  . fexofenadine (ALLEGRA) 180 MG tablet Take 1 tablet (180 mg total) by mouth daily. For allergy symptoms  . fluocinonide-emollient (LIDEX-E) 0.05 % cream Apply 1 application topically 2 (two) times daily. To affected areas  . fluticasone (FLONASE) 50 MCG/ACT nasal spray Place 2 sprays into both nostrils daily as needed for allergies or rhinitis.  . fluticasone furoate-vilanterol (BREO ELLIPTA) 100-25 MCG/INH AEPB Inhale 1 puff into the lungs daily.  . Ipratropium-Albuterol (COMBIVENT RESPIMAT) 20-100 MCG/ACT AERS respimat Inhale 1 puff into the lungs every 6 (six) hours  . isosorbide mononitrate (IMDUR) 60 MG 24 hr tablet Take 1 tablet (60 mg total) by mouth daily.  . metoprolol tartrate (LOPRESSOR) 25 MG tablet Take 1 tablet (25 mg total) by mouth 2 (two) times daily.  . mirtazapine (REMERON) 15 MG tablet Take 1 tablet (15 mg total) by mouth at bedtime. For sleep  . nitroGLYCERIN (NITROSTAT) 0.4 MG SL tablet Place 1 tablet (0.4 mg total) under the tongue  every 5 (five) minutes x 3 doses as needed for chest pain.  . pantoprazole (PROTONIX) 40 MG tablet TAKE  (1)  TABLET TWICE A DAY.  . tamsulosin (FLOMAX) 0.4 MG CAPS capsule Take 2 capsules (0.8 mg total) by mouth daily after supper.   Facility-Administered Encounter Medications as of 02/23/2020  Medication  . cyanocobalamin ((VITAMIN B-12)) injection 1,000 mcg     [name] received a phone call today from client. [name] spoke via phone with client today. Client asked about his lift chair prescription and asked about  delivery of lift chair. [name] informed client that [name] would send this request to [facility] today and let [name] research issues client asked about and would ask for [name] to call client today. Client agreed to this plan.  Follow Up Plan:  [name] to call client in next 4 weeks to talk with client about the social work needs of client at that time  Helios Kohlmann.Graciella Arment MSW, [name] Licensed Clinical Social Worker [facility] Care Management [phone]

## 2020-02-26 ENCOUNTER — Telehealth: Payer: Self-pay

## 2020-02-27 NOTE — Telephone Encounter (Signed)
FYI- Order was written and placed on [name] desk last Friday - this was to come back to [facility] desk to be faxed for pt.

## 2020-02-27 NOTE — Telephone Encounter (Signed)
Faxed order to [employer]

## 2020-02-28 ENCOUNTER — Ambulatory Visit: Payer: Medicare Other | Admitting: *Deleted

## 2020-02-28 DIAGNOSIS — G809 Cerebral palsy, unspecified: Secondary | ICD-10-CM

## 2020-02-28 DIAGNOSIS — R531 Weakness: Secondary | ICD-10-CM

## 2020-03-01 NOTE — Telephone Encounter (Signed)
Patient states he is trying to find somewhere he can get a lift chair and he will call us back when he has more information.

## 2020-03-01 NOTE — Chronic Care Management (AMB) (Signed)
  Chronic Care Management   Note  02/28/2020 Name: [name] MRN: [id number] DOB: 1956/03/20  consulted by [name], [name] regarding the order for a lift chair being denied at [facility]. Consulted with Jamelle Haring, LPN at [facility] and reviewed chart notes and order. Advised patient that he will need a face-to-face visit with PCP to document need for lift chair and those notes along with a detailed order form has to be sent in to the DME Supplier. He did not have supporting notes with the order so that is likely why it was denied.   Reviewed upcoming appt with Dr Livia Snellen and advised patient that he can do an evaluation for lift chair on that day. Appt notes updated.   [name], BSN, RN-BC Embedded Chronic Care Manager [facility] / [place] Management Direct Dial: [phone]

## 2020-03-01 NOTE — Patient Instructions (Signed)
Keep appt on 03/20/20 with Dr Livia Snellen for evaluation for lift chair and routine follow-up.   [name], BSN, RN-BC Embedded Chronic Care Manager [facility] / [place] Management Direct Dial: [phone]

## 2020-03-04 ENCOUNTER — Ambulatory Visit: Payer: Self-pay | Admitting: Licensed Clinical Social Worker

## 2020-03-04 DIAGNOSIS — G809 Cerebral palsy, unspecified: Secondary | ICD-10-CM

## 2020-03-04 DIAGNOSIS — R531 Weakness: Secondary | ICD-10-CM

## 2020-03-04 DIAGNOSIS — F411 Generalized anxiety disorder: Secondary | ICD-10-CM

## 2020-03-04 DIAGNOSIS — I25118 Atherosclerotic heart disease of native coronary artery with other forms of angina pectoris: Secondary | ICD-10-CM

## 2020-03-04 DIAGNOSIS — M5136 Other intervertebral disc degeneration, lumbar region: Secondary | ICD-10-CM

## 2020-03-04 DIAGNOSIS — K219 Gastro-esophageal reflux disease without esophagitis: Secondary | ICD-10-CM

## 2020-03-04 DIAGNOSIS — I252 Old myocardial infarction: Secondary | ICD-10-CM

## 2020-03-04 DIAGNOSIS — J441 Chronic obstructive pulmonary disease with (acute) exacerbation: Secondary | ICD-10-CM

## 2020-03-04 DIAGNOSIS — R482 Apraxia: Secondary | ICD-10-CM

## 2020-03-04 DIAGNOSIS — F3341 Major depressive disorder, recurrent, in partial remission: Secondary | ICD-10-CM

## 2020-03-04 DIAGNOSIS — E538 Deficiency of other specified B group vitamins: Secondary | ICD-10-CM

## 2020-03-04 NOTE — Patient Instructions (Addendum)
Licensed Clinical Social Worker Visit Information  Materials Provided: No  11/15/2021Name: [name]          MRN: [id number]       DOB: 10-13-1955  [name] is a 64 y.o. year old male who is a primary care patient of [name], [name], MD. The CCM team was consulted for assistance with [name] .   Review of patient status, including review of consultants reports, other relevant assessments, and collaboration with appropriate care team members and the patient's provider was performed as part of comprehensive patient evaluation and provision of chronic care management services.    SDOH (Social Determinants of Health) assessments performed: No;risk for depression; risk for stress; risk for financial strain; risk for tobacco use; risk for transport needs   [name] collaborated with [place] today regarding client request for lift chair for client. [name] reported that client needs to have a face to face meeting with Dr. Livia Snellen at [facility] to finalize information for lift chair request for client. [name] called client today and shared above information with client. Client is scheduled for an appointment with Dr. Livia Snellen on 03/20/2020 at 7:55 AM. [name] transport is scheduled to transport client to and from his appointment with Dr. Livia Snellen at [facility] on 03/20/2020.[name] talked with client about recent fall of client. He said his knees were still sore from his recent fall. [name] talked with client about food supply of client . Client was appreciative of call from [name] on 03/04/2020  Follow Up Plan: [name] to call client in next 4 weeks to talk with client about the social work needs of client at that time  The patient verbalized understanding of instructions provided today and declined a print copy of patient instruction materials.   [name].Dyer Klug MSW, [name] Licensed Clinical Social Worker [facility] Care Management [phone]

## 2020-03-04 NOTE — Chronic Care Management (AMB) (Signed)
Chronic Care Management   Follow Up Note   02/22/2020 Name: [name] MRN: [id number] DOB: 02-07-1956  Referred by: James Fraise, MD Reason for referral : Chronic Care Management (RN follow up)   [name] is a 64 y.o. year old male who is a primary care patient of [name], James Gash, MD. The CCM team was consulted for assistance with chronic disease management and care coordination needs.    Review of patient status, including review of consultants reports, relevant laboratory and other test results, and collaboration with appropriate care team members and the patient's provider was performed as part of comprehensive patient evaluation and provision of chronic care management services.    SDOH (Social Determinants of Health) assessments performed: Yes See Care Plan activities for detailed interventions related to [facility])     Outpatient Encounter Medications as of 02/22/2020  Medication Sig  . acetaminophen (TYLENOL) 325 MG tablet Take 2 tablets (650 mg total) by mouth every 6 (six) hours as needed for mild pain or headache.  . albuterol (PROAIR HFA) 108 (90 Base) MCG/ACT inhaler 1-2 puffs every 6 hours as needed wheezing or shortness of breath.  [name] aspirin EC 81 MG tablet Take 1 tablet (81 mg total) by mouth daily.  [name] atorvastatin (LIPITOR) 40 MG tablet TAKE 1 TABLET ONCE DAILY AT 6PM  . buPROPion (WELLBUTRIN SR) 150 MG 12 hr tablet Take 1 tablet (150 mg total) by mouth daily with breakfast.  . butalbital-acetaminophen-caffeine (ESGIC) 50-325-40 MG tablet Take 1 tablet by mouth every 6 (six) hours as needed for headache.  . celecoxib (CELEBREX) 200 MG capsule Take 1 capsule (200 mg total) by mouth daily as needed.  . cetaphil (CETAPHIL) lotion Apply 1 application topically 2 (two) times daily. For rash. Apply after washing with [facility] shower gel and rinsing with clear warm water.  . DULoxetine (CYMBALTA) 60 MG capsule Take 1 capsule (60 mg total) by mouth daily. WITH A FULL  STOMACH AT SUPPER TIME  . fexofenadine (ALLEGRA) 180 MG tablet Take 1 tablet (180 mg total) by mouth daily. For allergy symptoms  . fluocinonide-emollient (LIDEX-E) 0.05 % cream Apply 1 application topically 2 (two) times daily. To affected areas  . fluticasone (FLONASE) 50 MCG/ACT nasal spray Place 2 sprays into both nostrils daily as needed for allergies or rhinitis.  . fluticasone furoate-vilanterol (BREO ELLIPTA) 100-25 MCG/INH AEPB Inhale 1 puff into the lungs daily.  . Ipratropium-Albuterol (COMBIVENT RESPIMAT) 20-100 MCG/ACT AERS respimat Inhale 1 puff into the lungs every 6 (six) hours  . isosorbide mononitrate (IMDUR) 60 MG 24 hr tablet Take 1 tablet (60 mg total) by mouth daily.  . metoprolol tartrate (LOPRESSOR) 25 MG tablet Take 1 tablet (25 mg total) by mouth 2 (two) times daily.  . mirtazapine (REMERON) 15 MG tablet Take 1 tablet (15 mg total) by mouth at bedtime. For sleep  . nitroGLYCERIN (NITROSTAT) 0.4 MG SL tablet Place 1 tablet (0.4 mg total) under the tongue every 5 (five) minutes x 3 doses as needed for chest pain.  . pantoprazole (PROTONIX) 40 MG tablet TAKE  (1)  TABLET TWICE A DAY.  . tamsulosin (FLOMAX) 0.4 MG CAPS capsule Take 2 capsules (0.8 mg total) by mouth daily after supper.   Facility-Administered Encounter Medications as of 02/22/2020  Medication  . cyanocobalamin ((VITAMIN B-12)) injection 1,000 mcg     Objective:   Goals Addressed              This Visit's Progress  Patient Stated   .  "I need help getting another lift chair" (pt-stated)        CARE PLAN ENTRY (see longitudinal plan of care for additional care plan information)  Current Barriers:  . Care Coordination needs related to lift chair DME order in a patient with infantile cerebral palsy, CAD, hypertension, and frequent falls.   Nurse Case Manager Clinical Goal(s):  [name] Over the next 30 days, patient will work with PCP office regarding order for lift chair  Interventions:   . Inter-disciplinary care team collaboration (see longitudinal plan of care) . Consulted by [employer], [name] . collaborated with James Haring, LPN at [facility] . Chart reviewed  . Advised patient that a face-to-face visit with documentation for why he needs the lift chair has to be completed before Medicare will provide coverage . Advised that this is likely why the recent order was denied . Reviewed upcoming appt with Dr [name] and added appointment notes that patient needs MCR eval for lift chair . Confirmed with [name], that transportation for appt has been arranged.  . Advised patient to not pursue purchasing chair or putting money down on a chair until after this visit and the order is completed and faxed. With Medicare and Medicaid he should have coverage if ordered correctly.  . Verified with James Haring, LPN that no other forms or documentation are needed at this time . Encouraged patient to reach out to [facility] team as needed  Patient Self Care Activities:  . Performs ADL's independently . Needs assistance with transportation  Initial goal documentation      .  COMPLETED: "I need my Aide to get more hours" (pt-stated)        [place] (see longitudinal plan of care for additional care plan information)  Current Barriers:  . Care Coordination needs related to in-home care services in a patient with hypertension, hx of MI, infantile cerebral palsy, and depression . Literacy barriers . Transportation barriers . Cognitive Deficits  Nurse Case Manager Clinical Goal(s):  [name] Over the next 10 days, patient will meet with PCP to discuss medical changes that necessitate an increase in PCS hours  Interventions:  . Inter-disciplinary care team collaboration (see longitudinal plan of care) . Chart reviewed . Collaborated with James [name], [name] . Collaborated with PCP, Dr [name] regarding patient's request for an increase in Aide hours . Talked with patient by  telephone . Aide currently comes 3 hours a day Mon-Fri o Helps with general household needs and meal prep . Discussed safety concerns around dizziness and falls and weight loss due to decrease in appetite . Reviewed and discussed medications . Collaborated with [facility] triage nurse to schedule visit with Dr [name] on 02/06/20 . [name] transportation will be arranged by [name]  Patient Self Care Activities:  . Calls provider office for new concerns or questions . Takes prepackaged medications as prescribed . Unable to independently drive or complete all ADLs and IADLs independently   Initial goal documentation  Has a new aide with updated hours. Goals completed        Plan:   Telephone follow up appointment with care management team member scheduled for: 03/05/20 with RN Care Manager Next PCP appointment scheduled for: 03/20/20 with Dr [name]   [name], BSN, RN-BC [place] / [place] Management Direct Dial: [phone]

## 2020-03-04 NOTE — Patient Instructions (Signed)
Visit Information  Goals Addressed              This Visit's Progress     Patient Stated   .  "I need help getting another lift chair" (pt-stated)        CARE PLAN ENTRY (see longitudinal plan of care for additional care plan information)  Current Barriers:  . Care Coordination needs related to lift chair DME order in a patient with infantile cerebral palsy, CAD, hypertension, and frequent falls.   Nurse Case Manager Clinical Goal(s):  [name] Over the next 30 days, patient will work with PCP office regarding order for lift chair  Interventions:  . Inter-disciplinary care team collaboration (see longitudinal plan of care) . Consulted by [employer], [name] . collaborated with Jamelle Haring, LPN at [facility] . Chart reviewed  . Advised patient that a face-to-face visit with documentation for why he needs the lift chair has to be completed before Medicare will provide coverage . Advised that this is likely why the recent order was denied . Reviewed upcoming appt with Dr Livia Snellen and added appointment notes that patient needs MCR eval for lift chair . Confirmed with [name], that transportation for appt has been arranged.  . Advised patient to not pursue purchasing chair or putting money down on a chair until after this visit and the order is completed and faxed. With Medicare and Medicaid he should have coverage if ordered correctly.  . Verified with Jamelle Haring, LPN that no other forms or documentation are needed at this time . Encouraged patient to reach out to [facility] team as needed  Patient Self Care Activities:  . Performs ADL's independently . Needs assistance with transportation  Initial goal documentation      .  COMPLETED: "I need my Aide to get more hours" (pt-stated)        [place] (see longitudinal plan of care for additional care plan information)  Current Barriers:  . Care Coordination needs related to in-home care services in a patient with hypertension, hx of MI,  infantile cerebral palsy, and depression . Literacy barriers . Transportation barriers . Cognitive Deficits  Nurse Case Manager Clinical Goal(s):  [name] Over the next 10 days, patient will meet with PCP to discuss medical changes that necessitate an increase in PCS hours  Interventions:  . Inter-disciplinary care team collaboration (see longitudinal plan of care) . Chart reviewed . Collaborated with [name], [name] . Collaborated with PCP, Dr Livia Snellen regarding patient's request for an increase in Aide hours . Talked with patient by telephone . Aide currently comes 3 hours a day Mon-Fri o Helps with general household needs and meal prep . Discussed safety concerns around dizziness and falls and weight loss due to decrease in appetite . Reviewed and discussed medications . Collaborated with [facility] triage nurse to schedule visit with Dr Livia Snellen on 02/06/20 . [name] transportation will be arranged by [name]  Patient Self Care Activities:  . Calls provider office for new concerns or questions . Takes prepackaged medications as prescribed . Unable to independently drive or complete all ADLs and IADLs independently   Initial goal documentation  Has a new aide with updated hours. Goals completed       The patient verbalized understanding of instructions, educational materials, and care plan provided today and declined offer to receive copy of patient instructions, educational materials, and care plan.   Follow-up  Plan:   Telephone follow up appointment with care management team member scheduled for:  03/05/20 with RN Care Manager Next PCP appointment scheduled for: 03/20/20 with Dr Livia Snellen   [name], BSN, RN-BC [place] / [place] Management Direct Dial: [phone]

## 2020-03-04 NOTE — Chronic Care Management (AMB) (Signed)
Chronic Care Management    Clinical Social Work Follow Up Note  03/04/2020 Name: [name] MRN: [id number] DOB: 1956/03/08  [name] is a 64 y.o. year old male who is a primary care patient of [name], [name], MD. The CCM team was consulted for assistance with [name] .   Review of patient status, including review of consultants reports, other relevant assessments, and collaboration with appropriate care team members and the patient's provider was performed as part of comprehensive patient evaluation and provision of chronic care management services.    SDOH (Social Determinants of Health) assessments performed: No;risk for depression; risk for stress; risk for financial strain; risk for tobacco use; risk for transport needs    Chronic Care Management from 02/21/2019 in [place]  PHQ-9 Total Score 9       GAD 7 : Generalized Anxiety Score 04/10/2019 05/01/2016 03/31/2016  Nervous, Anxious, on Edge 0 1 3  Control/stop worrying 0 1 3  Worry too much - different things 0 1 3  Trouble relaxing 0 0 2  Restless 0 0 2  Easily annoyed or irritable 0 0 0  Afraid - awful might happen 0 0 0  Total GAD 7 Score 0 3 13  Anxiety Difficulty Not difficult at all Not difficult at all Not difficult at all    Outpatient Encounter Medications as of 03/04/2020  Medication Sig  . acetaminophen (TYLENOL) 325 MG tablet Take 2 tablets (650 mg total) by mouth every 6 (six) hours as needed for mild pain or headache.  . albuterol (PROAIR HFA) 108 (90 Base) MCG/ACT inhaler 1-2 puffs every 6 hours as needed wheezing or shortness of breath.  [name] aspirin EC 81 MG tablet Take 1 tablet (81 mg total) by mouth daily.  [name] atorvastatin (LIPITOR) 40 MG tablet TAKE 1 TABLET ONCE DAILY AT 6PM  . buPROPion (WELLBUTRIN SR) 150 MG 12 hr tablet Take 1 tablet (150 mg total) by mouth daily with breakfast.  . butalbital-acetaminophen-caffeine (ESGIC) 50-325-40 MG tablet Take 1 tablet  by mouth every 6 (six) hours as needed for headache.  . celecoxib (CELEBREX) 200 MG capsule Take 1 capsule (200 mg total) by mouth daily as needed.  . cetaphil (CETAPHIL) lotion Apply 1 application topically 2 (two) times daily. For rash. Apply after washing with [facility] shower gel and rinsing with clear warm water.  . DULoxetine (CYMBALTA) 60 MG capsule Take 1 capsule (60 mg total) by mouth daily. WITH A FULL STOMACH AT SUPPER TIME  . fexofenadine (ALLEGRA) 180 MG tablet Take 1 tablet (180 mg total) by mouth daily. For allergy symptoms  . fluocinonide-emollient (LIDEX-E) 0.05 % cream Apply 1 application topically 2 (two) times daily. To affected areas  . fluticasone (FLONASE) 50 MCG/ACT nasal spray Place 2 sprays into both nostrils daily as needed for allergies or rhinitis.  . fluticasone furoate-vilanterol (BREO ELLIPTA) 100-25 MCG/INH AEPB Inhale 1 puff into the lungs daily.  . Ipratropium-Albuterol (COMBIVENT RESPIMAT) 20-100 MCG/ACT AERS respimat Inhale 1 puff into the lungs every 6 (six) hours  . isosorbide mononitrate (IMDUR) 60 MG 24 hr tablet Take 1 tablet (60 mg total) by mouth daily.  . metoprolol tartrate (LOPRESSOR) 25 MG tablet Take 1 tablet (25 mg total) by mouth 2 (two) times daily.  . mirtazapine (REMERON) 15 MG tablet Take 1 tablet (15 mg total) by mouth at bedtime. For sleep  . nitroGLYCERIN (NITROSTAT) 0.4 MG SL tablet Place 1 tablet (0.4 mg total) under the tongue  every 5 (five) minutes x 3 doses as needed for chest pain.  . pantoprazole (PROTONIX) 40 MG tablet TAKE  (1)  TABLET TWICE A DAY.  . tamsulosin (FLOMAX) 0.4 MG CAPS capsule Take 2 capsules (0.8 mg total) by mouth daily after supper.   Facility-Administered Encounter Medications as of 03/04/2020  Medication  . cyanocobalamin ((VITAMIN B-12)) injection 1,000 mcg    [name] collaborated with [place] today regarding client request for lift chair for client. [name] reported that client needs to have a face  to face meeting with Dr. Livia Snellen at [facility] to finalize information for lift chair request for client. [name] called client today and shared above information with client. Client is scheduled for an appointment with Dr. Livia Snellen on 03/20/2020 at 7:55 AM. [name] transport is scheduled to transport client to and from his appointment with Dr. Livia Snellen at [facility] on 03/20/2020.[name] talked with client about recent fall of client. He said his knees were still sore from his recent fall. [name] talked with client about food supply of client . Client was appreciative of call from [name] on 03/04/2020  Follow Up Plan: [name] to call client in next 4 weeks to talk with client about the social work needs of client at that time  Garik Diamant.Salina Stanfield MSW, [name] Licensed Clinical Social Worker [place] [facility] Care Management [phone]

## 2020-03-05 ENCOUNTER — Telehealth: Payer: Medicare Other

## 2020-03-18 ENCOUNTER — Telehealth: Payer: Self-pay | Admitting: Family Medicine

## 2020-03-19 ENCOUNTER — Ambulatory Visit: Payer: Self-pay | Admitting: Licensed Clinical Social Worker

## 2020-03-19 DIAGNOSIS — I252 Old myocardial infarction: Secondary | ICD-10-CM

## 2020-03-19 DIAGNOSIS — E538 Deficiency of other specified B group vitamins: Secondary | ICD-10-CM

## 2020-03-19 DIAGNOSIS — I25118 Atherosclerotic heart disease of native coronary artery with other forms of angina pectoris: Secondary | ICD-10-CM

## 2020-03-19 DIAGNOSIS — G809 Cerebral palsy, unspecified: Secondary | ICD-10-CM

## 2020-03-19 DIAGNOSIS — F411 Generalized anxiety disorder: Secondary | ICD-10-CM

## 2020-03-19 DIAGNOSIS — K219 Gastro-esophageal reflux disease without esophagitis: Secondary | ICD-10-CM

## 2020-03-19 DIAGNOSIS — F3341 Major depressive disorder, recurrent, in partial remission: Secondary | ICD-10-CM

## 2020-03-19 DIAGNOSIS — M5136 Other intervertebral disc degeneration, lumbar region: Secondary | ICD-10-CM

## 2020-03-19 DIAGNOSIS — R482 Apraxia: Secondary | ICD-10-CM

## 2020-03-19 DIAGNOSIS — R531 Weakness: Secondary | ICD-10-CM

## 2020-03-19 NOTE — Chronic Care Management (AMB) (Signed)
Chronic Care Management    Clinical Social Work Follow Up Note  03/19/2020 Name: GALILEO COLELLO MRN: [id number] DOB: [birth year]  MANSON LUCKADOO is a 64 y.o. year old male who is a primary care patient of [name], [name], MD. The CCM team was consulted for assistance with [employer].   Review of patient status, including review of consultants reports, other relevant assessments, and collaboration with appropriate care team members and the patient's provider was performed as part of comprehensive patient evaluation and provision of chronic care management services.    SDOH (Social Determinants of Health) assessments performed: No; risk for tobacco use; risk for depression; risk for transport needs; risk for financial strain    Chronic Care Management from 02/21/2019 in [place]  PHQ-9 Total Score 9       GAD 7 : Generalized Anxiety Score 04/10/2019 05/01/2016 03/31/2016  Nervous, Anxious, on Edge 0 1 3  Control/stop worrying 0 1 3  Worry too much - different things 0 1 3  Trouble relaxing 0 0 2  Restless 0 0 2  Easily annoyed or irritable 0 0 0  Afraid - awful might happen 0 0 0  Total GAD 7 Score 0 3 13  Anxiety Difficulty Not difficult at all Not difficult at all Not difficult at all    Outpatient Encounter Medications as of 03/19/2020  Medication Sig  . acetaminophen (TYLENOL) 325 MG tablet Take 2 tablets (650 mg total) by mouth every 6 (six) hours as needed for mild pain or headache.  . albuterol (PROAIR HFA) 108 (90 Base) MCG/ACT inhaler 1-2 puffs every 6 hours as needed wheezing or shortness of breath.  [name] aspirin EC 81 MG tablet Take 1 tablet (81 mg total) by mouth daily.  [name] atorvastatin (LIPITOR) 40 MG tablet TAKE 1 TABLET ONCE DAILY AT 6PM  . buPROPion (WELLBUTRIN SR) 150 MG 12 hr tablet Take 1 tablet (150 mg total) by mouth daily with breakfast.  . butalbital-acetaminophen-caffeine (ESGIC) 50-325-40 MG tablet Take 1 tablet by mouth every 6  (six) hours as needed for headache.  . celecoxib (CELEBREX) 200 MG capsule Take 1 capsule (200 mg total) by mouth daily as needed.  . cetaphil (CETAPHIL) lotion Apply 1 application topically 2 (two) times daily. For rash. Apply after washing with [facility] shower gel and rinsing with clear warm water.  . DULoxetine (CYMBALTA) 60 MG capsule Take 1 capsule (60 mg total) by mouth daily. WITH A FULL STOMACH AT SUPPER TIME  . fexofenadine (ALLEGRA) 180 MG tablet Take 1 tablet (180 mg total) by mouth daily. For allergy symptoms  . fluocinonide-emollient (LIDEX-E) 0.05 % cream Apply 1 application topically 2 (two) times daily. To affected areas  . fluticasone (FLONASE) 50 MCG/ACT nasal spray Place 2 sprays into both nostrils daily as needed for allergies or rhinitis.  . fluticasone furoate-vilanterol (BREO ELLIPTA) 100-25 MCG/INH AEPB Inhale 1 puff into the lungs daily.  . Ipratropium-Albuterol (COMBIVENT RESPIMAT) 20-100 MCG/ACT AERS respimat Inhale 1 puff into the lungs every 6 (six) hours  . isosorbide mononitrate (IMDUR) 60 MG 24 hr tablet Take 1 tablet (60 mg total) by mouth daily.  . metoprolol tartrate (LOPRESSOR) 25 MG tablet Take 1 tablet (25 mg total) by mouth 2 (two) times daily.  . mirtazapine (REMERON) 15 MG tablet Take 1 tablet (15 mg total) by mouth at bedtime. For sleep  . nitroGLYCERIN (NITROSTAT) 0.4 MG SL tablet Place 1 tablet (0.4 mg total) under the tongue every 5 (five)  minutes x 3 doses as needed for chest pain.  . pantoprazole (PROTONIX) 40 MG tablet TAKE  (1)  TABLET TWICE A DAY.  . tamsulosin (FLOMAX) 0.4 MG CAPS capsule Take 2 capsules (0.8 mg total) by mouth daily after supper.   Facility-Administered Encounter Medications as of 03/19/2020  Medication  . cyanocobalamin ((VITAMIN B-12)) injection 1,000 mcg     Goals          Current Barriers:   Film/video editor.   Literacy barriers  Transportation barriers in client with Chronic Diagnoses of DDD, Depressin  Apraxia of Speech, CAD GAD, Hx MI, HTN, Infantile Cerebral Palsy   Cognitive Deficits  Nurse Case Manager Clinical Goal(s):  Over the next 7 days, patient will have transportation arranged for B12 injection that is scheduled for client at [facility] on March 20, 2020   Interventions:   [name] contacted [name] representative previously and confirmed client transport arrangements with [name] for client appointment at [facility] on 03/20/2020.  Called client today and informed client of transport arrangements for client for 03/20/2020 appointment at [facility]  Talked with client about fall issues of client  Talked with client about Lift Chair needs and encouraged client to talk with Dr. Livia Snellen on 03/20/2020 client appointment with Dr. Livia Snellen at [facility]  Talked with client about pain issues of client  Talked with client about mobility of client   Patient Self Care Activities:   Currently UNABLE TO independently drive to medical appointments.   Please see past updates related to this goal by clicking on the "Past Updates" button in the selected goal     Follow Up Plan: [name] to call client in next 4 weeks to discuss social work needs of client at that time  Anias Bartol.Azora Bonzo MSW, [name] Licensed Clinical Social Worker [facility] Care Management [phone]

## 2020-03-19 NOTE — Patient Instructions (Addendum)
Licensed Clinical Social Worker Visit Information  Goals we discussed today:    Current Barriers:   Film/video editor.   Literacy barriers  Transportation barriers in client with Chronic Diagnoses of DDD, Depressin Apraxia of Speech, CAD GAD, Hx MI, HTN, Infantile Cerebral Palsy   Cognitive Deficits  Nurse Case Manager Clinical Goal(s):  Over the next 7 days, patient will have transportation arranged for B12 injection that is scheduled for client at [facility] on March 20, 2020   Interventions:   [name] contacted [name] representative previously and confirmed client transport arrangements with [name] for client appointment at [facility] on 03/20/2020.  Called client today and informed client of transport arrangements for client for 03/20/2020 appointment at [facility]  Talked with client about fall issues of client  Talked with client about Lift Chair needs and encouraged client to talk with Dr. Livia Snellen on 03/20/2020 client appointment with Dr. Livia Snellen at [facility]  Talked with client about pain issues of client  Talked with client about mobility of client   Patient Self Care Activities:   Currently UNABLE TO independently drive to medical appointments.   Please see past updates related to this goal by clicking on the "Past Updates" button in the selected goal     Follow Up Plan: [name] to call client in next 4 weeks to discuss social work needs of client at that time  Materials Provided: No  The patient verbalized understanding of instructions provided today and declined a print copy of patient instruction materials.   [name].Labrandon Knoch MSW, [name] Licensed Clinical Social Worker [facility] Care [facility] [phone]

## 2020-03-20 ENCOUNTER — Other Ambulatory Visit: Payer: Self-pay

## 2020-03-20 ENCOUNTER — Ambulatory Visit: Payer: Medicare Other | Admitting: Family Medicine

## 2020-03-20 ENCOUNTER — Ambulatory Visit (INDEPENDENT_AMBULATORY_CARE_PROVIDER_SITE_OTHER): Payer: Medicare Other | Admitting: *Deleted

## 2020-03-20 DIAGNOSIS — E538 Deficiency of other specified B group vitamins: Secondary | ICD-10-CM | POA: Diagnosis not present

## 2020-03-20 NOTE — Progress Notes (Signed)
Pt given B12 injection IM left deltoid and tolerated well. °

## 2020-03-28 ENCOUNTER — Encounter: Payer: Self-pay | Admitting: Family Medicine

## 2020-03-28 ENCOUNTER — Other Ambulatory Visit: Payer: Self-pay

## 2020-03-28 ENCOUNTER — Ambulatory Visit (INDEPENDENT_AMBULATORY_CARE_PROVIDER_SITE_OTHER): Payer: Medicare Other | Admitting: Family Medicine

## 2020-03-28 VITALS — BP 128/73 | HR 81 | Temp 98.0°F | Ht 73.0 in | Wt 256.6 lb

## 2020-03-28 DIAGNOSIS — I25118 Atherosclerotic heart disease of native coronary artery with other forms of angina pectoris: Secondary | ICD-10-CM | POA: Diagnosis not present

## 2020-03-28 DIAGNOSIS — E785 Hyperlipidemia, unspecified: Secondary | ICD-10-CM

## 2020-03-28 DIAGNOSIS — I209 Angina pectoris, unspecified: Secondary | ICD-10-CM

## 2020-03-28 DIAGNOSIS — F3341 Major depressive disorder, recurrent, in partial remission: Secondary | ICD-10-CM

## 2020-03-28 DIAGNOSIS — M5136 Other intervertebral disc degeneration, lumbar region: Secondary | ICD-10-CM

## 2020-03-28 DIAGNOSIS — R531 Weakness: Secondary | ICD-10-CM

## 2020-03-28 DIAGNOSIS — F411 Generalized anxiety disorder: Secondary | ICD-10-CM

## 2020-03-28 DIAGNOSIS — Z9181 History of falling: Secondary | ICD-10-CM

## 2020-03-28 MED ORDER — ATORVASTATIN CALCIUM 40 MG PO TABS: ORAL_TABLET | ORAL | 1 refills | Status: DC

## 2020-03-28 MED ORDER — DULOXETINE HCL 60 MG PO CPEP: 60.0000 mg | ORAL_CAPSULE | Freq: Every day | ORAL | 1 refills | Status: DC

## 2020-03-28 MED ORDER — CELECOXIB 200 MG PO CAPS: 200.0000 mg | ORAL_CAPSULE | Freq: Every day | ORAL | 1 refills | Status: DC | PRN

## 2020-03-28 MED ORDER — BUPROPION HCL ER (SR) 150 MG PO TB12: 150.0000 mg | ORAL_TABLET | Freq: Every day | ORAL | 1 refills | Status: DC

## 2020-03-28 MED ORDER — NITROGLYCERIN 0.4 MG SL SUBL: 0.4000 mg | SUBLINGUAL_TABLET | SUBLINGUAL | 11 refills | Status: DC | PRN

## 2020-03-28 NOTE — Progress Notes (Signed)
Subjective:  Patient ID: [name], male    DOB: [birth year]  Age: 64 y.o. MRN: [id number]  CC: Follow-up (3 month, Lift Chair order)   HPI [name] presents for face-to-face evaluation for a lift chair.  He has a history of cerebral palsy and weakness on the left side of his body.  Particularly of the left upper extremity is weak and deformed.  He has difficulty bringing himself from seated to standing due to lumbar degenerative disease.  As result when he sits down in a padded chair he has a great deal of difficulty raising himself up.  He does not have anyone at home to help him and has on occasion been stuck in a chair for an extended period of time.  Depression screen [facility] 2/9 03/28/2020 02/06/2020 08/23/2019  Decreased Interest 0 0 0  Down, Depressed, Hopeless 0 0 0  PHQ - 2 Score 0 0 0  Altered sleeping - - -  Tired, decreased energy - - -  Change in appetite - - -  Feeling bad or failure about yourself  - - -  Trouble concentrating - - -  Moving slowly or fidgety/restless - - -  Suicidal thoughts - - -  PHQ-9 Score - - -  Difficult doing work/chores - - -  Some recent data might be hidden    History [name] has a past medical history of Cerebral palsy ([place]), Coronary artery disease, Depression, GERD (gastroesophageal reflux disease), Hypertension, Scoliosis, and Seizures ([place]).   He has a past surgical history that includes Coronary angioplasty with stent; Carpal tunnel release (Right, 08/13/2016); and LEFT HEART CATH AND CORONARY ANGIOGRAPHY (N/A, 07/10/2017).   His family history includes Alcohol abuse in his brother; CAD in his brother and sister; CAD (age of onset: 35) in his father; Diabetes in his mother; Heart disease in his father; Heart failure in his sister.He reports that he quit smoking about 21 months ago. His smoking use included cigarettes. He has a 20.50 pack-year smoking history. He has never used smokeless tobacco. He reports current alcohol use. He  reports that he does not use drugs.    ROS Review of Systems  Constitutional: Negative for fever.  Respiratory: Negative for shortness of breath.   Cardiovascular: Negative for chest pain.  Musculoskeletal: Negative for arthralgias.  Skin: Negative for rash.    Objective:  BP 128/73   Pulse 81   Temp 98 F (36.7 C) (Temporal)   Ht 6\' 1"  (1.854 m)   Wt 256 lb 9.6 oz (116.4 kg)   BMI 33.85 kg/m   BP Readings from Last 3 Encounters:  03/28/20 128/73  02/06/20 123/71  12/20/19 130/85    Wt Readings from Last 3 Encounters:  03/28/20 256 lb 9.6 oz (116.4 kg)  02/06/20 255 lb 3.2 oz (115.8 kg)  12/20/19 248 lb 2 oz (112.5 kg)     Physical Exam Vitals reviewed.  Constitutional:      Appearance: He is well-developed and well-nourished.  HENT:     Head: Normocephalic and atraumatic.     Right Ear: Tympanic membrane and external ear normal. No decreased hearing noted.     Left Ear: Tympanic membrane and external ear normal. No decreased hearing noted.     Mouth/Throat:     Pharynx: No oropharyngeal exudate or posterior oropharyngeal erythema.  Eyes:     Pupils: Pupils are equal, round, and reactive to light.  Cardiovascular:     Rate and Rhythm: Normal rate and  regular rhythm.     Heart sounds: No murmur heard.   Pulmonary:     Effort: No respiratory distress.     Breath sounds: Normal breath sounds.  Abdominal:     General: Bowel sounds are normal.     Palpations: Abdomen is soft. There is no mass.     Tenderness: There is no abdominal tenderness.  Musculoskeletal:     Cervical back: Normal range of motion and neck supple.       Assessment & Plan:   [name] was seen today for follow-up.  Diagnoses and all orders for this visit:  Left-sided weakness -     For home use only DME Other see comment  Coronary artery disease of native artery of native heart with stable angina pectoris (HCC) -     nitroGLYCERIN (NITROSTAT) 0.4 MG SL tablet; Place 1 tablet  (0.4 mg total) under the tongue every 5 (five) minutes x 3 doses as needed for chest pain. -     atorvastatin (LIPITOR) 40 MG tablet; TAKE 1 TABLET ONCE DAILY AT 6PM  Angina pectoris (HCC) -     nitroGLYCERIN (NITROSTAT) 0.4 MG SL tablet; Place 1 tablet (0.4 mg total) under the tongue every 5 (five) minutes x 3 doses as needed for chest pain.  Recurrent major depressive disorder, in partial remission (HCC) -     DULoxetine (CYMBALTA) 60 MG capsule; Take 1 capsule (60 mg total) by mouth daily. WITH A FULL STOMACH AT SUPPER TIME -     buPROPion (WELLBUTRIN SR) 150 MG 12 hr tablet; Take 1 tablet (150 mg total) by mouth daily with breakfast.  GAD (generalized anxiety disorder) -     DULoxetine (CYMBALTA) 60 MG capsule; Take 1 capsule (60 mg total) by mouth daily. WITH A FULL STOMACH AT SUPPER TIME  Degenerative disc disease, lumbar -     celecoxib (CELEBREX) 200 MG capsule; Take 1 capsule (200 mg total) by mouth daily as needed.  Dyslipidemia, goal LDL below 70 -     atorvastatin (LIPITOR) 40 MG tablet; TAKE 1 TABLET ONCE DAILY AT 6PM       I am having [name] maintain his acetaminophen, cetaphil, aspirin EC, butalbital-acetaminophen-caffeine, albuterol, fexofenadine, fluocinonide-emollient, fluticasone, Breo Ellipta, Combivent Respimat, isosorbide mononitrate, metoprolol tartrate, mirtazapine, pantoprazole, tamsulosin, nitroGLYCERIN, DULoxetine, celecoxib, buPROPion, and atorvastatin. We will continue to administer cyanocobalamin.  Allergies as of 03/28/2020      Reactions   Chantix [varenicline Tartrate] Nausea And Vomiting      Medication List       Accurate as of March 28, 2020 11:59 PM. If you have any questions, ask your nurse or doctor.        acetaminophen 325 MG tablet Commonly known as: TYLENOL Take 2 tablets (650 mg total) by mouth every 6 (six) hours as needed for mild pain or headache.   albuterol 108 (90 Base) MCG/ACT inhaler Commonly known as: ProAir  HFA 1-2 puffs every 6 hours as needed wheezing or shortness of breath.   aspirin EC 81 MG tablet Take 1 tablet (81 mg total) by mouth daily.   atorvastatin 40 MG tablet Commonly known as: LIPITOR TAKE 1 TABLET ONCE DAILY AT 6PM   Breo Ellipta 100-25 MCG/INH Aepb Generic drug: fluticasone furoate-vilanterol Inhale 1 puff into the lungs daily.   buPROPion 150 MG 12 hr tablet Commonly known as: WELLBUTRIN SR Take 1 tablet (150 mg total) by mouth daily with breakfast.   butalbital-acetaminophen-caffeine 50-325-40 MG tablet Commonly known as:  Esgic Take 1 tablet by mouth every 6 (six) hours as needed for headache.   celecoxib 200 MG capsule Commonly known as: CELEBREX Take 1 capsule (200 mg total) by mouth daily as needed.   cetaphil lotion Apply 1 application topically 2 (two) times daily. For rash. Apply after washing with [facility] shower gel and rinsing with clear warm water.   Combivent Respimat 20-100 MCG/ACT Aers respimat Generic drug: Ipratropium-Albuterol Inhale 1 puff into the lungs every 6 (six) hours   DULoxetine 60 MG capsule Commonly known as: CYMBALTA Take 1 capsule (60 mg total) by mouth daily. WITH A FULL STOMACH AT SUPPER TIME   fexofenadine 180 MG tablet Commonly known as: ALLEGRA Take 1 tablet (180 mg total) by mouth daily. For allergy symptoms   fluocinonide-emollient 0.05 % cream Commonly known as: LIDEX-E Apply 1 application topically 2 (two) times daily. To affected areas   fluticasone 50 MCG/ACT nasal spray Commonly known as: FLONASE Place 2 sprays into both nostrils daily as needed for allergies or rhinitis.   isosorbide mononitrate 60 MG 24 hr tablet Commonly known as: IMDUR Take 1 tablet (60 mg total) by mouth daily.   metoprolol tartrate 25 MG tablet Commonly known as: LOPRESSOR Take 1 tablet (25 mg total) by mouth 2 (two) times daily.   mirtazapine 15 MG tablet Commonly known as: Remeron Take 1 tablet (15 mg total) by mouth at  bedtime. For sleep   nitroGLYCERIN 0.4 MG SL tablet Commonly known as: NITROSTAT Place 1 tablet (0.4 mg total) under the tongue every 5 (five) minutes x 3 doses as needed for chest pain.   pantoprazole 40 MG tablet Commonly known as: PROTONIX TAKE  (1)  TABLET TWICE A DAY.   tamsulosin 0.4 MG Caps capsule Commonly known as: FLOMAX Take 2 capsules (0.8 mg total) by mouth daily after supper.            Durable Medical Equipment  (From admission, onward)         Start     Ordered   03/28/20 0000  For home use only DME Other see comment       Comments: Lift Chair  Question:  Length of Need  Answer:  Lifetime   03/28/20 1105           Follow-up: No follow-ups on file.  [name], M.D.

## 2020-03-29 ENCOUNTER — Ambulatory Visit: Payer: Self-pay | Admitting: Licensed Clinical Social Worker

## 2020-03-29 DIAGNOSIS — F3341 Major depressive disorder, recurrent, in partial remission: Secondary | ICD-10-CM

## 2020-03-29 DIAGNOSIS — G809 Cerebral palsy, unspecified: Secondary | ICD-10-CM

## 2020-03-29 DIAGNOSIS — I25118 Atherosclerotic heart disease of native coronary artery with other forms of angina pectoris: Secondary | ICD-10-CM

## 2020-03-29 DIAGNOSIS — F411 Generalized anxiety disorder: Secondary | ICD-10-CM

## 2020-03-29 DIAGNOSIS — I10 Essential (primary) hypertension: Secondary | ICD-10-CM

## 2020-03-29 DIAGNOSIS — K219 Gastro-esophageal reflux disease without esophagitis: Secondary | ICD-10-CM

## 2020-03-29 DIAGNOSIS — I252 Old myocardial infarction: Secondary | ICD-10-CM

## 2020-03-29 DIAGNOSIS — R482 Apraxia: Secondary | ICD-10-CM

## 2020-03-29 DIAGNOSIS — E538 Deficiency of other specified B group vitamins: Secondary | ICD-10-CM

## 2020-03-29 DIAGNOSIS — M51369 Other intervertebral disc degeneration, lumbar region without mention of lumbar back pain or lower extremity pain: Secondary | ICD-10-CM

## 2020-03-29 DIAGNOSIS — J441 Chronic obstructive pulmonary disease with (acute) exacerbation: Secondary | ICD-10-CM

## 2020-03-29 DIAGNOSIS — M5136 Other intervertebral disc degeneration, lumbar region: Secondary | ICD-10-CM

## 2020-03-29 NOTE — Patient Instructions (Addendum)
Licensed Clinical Social Worker Visit Information  Goals we discussed today:    Current Barriers:   Film/video editor.   Literacy barriers  Transportation barriers in client with Chronic Diagnoses of DDD, Depressin Apraxia of Speech, CAD GAD, Hx MI, HTN, Infantile Cerebral Palsy   Cognitive Deficits  Nurse Case Manager Clinical Goal(s):  Over the next 14 days, patient will have transportation arranged for his next  [facility]    Interventions:   Called client and informed client that his next B12 injection appointment at [facility] was scheduled or April 22, 2020.  Talked with client about his recent appointment with Dr. Livia Snellen    Talked with client about medical order for Lift Chair for client   Patient Self Care Activities:   Currently UNABLE TO independently drive to medical appointments.   Please see past updates related to this goal by clicking on the "Past Updates" button in the selected goal     Follow Up Plan:[name] to call client in next 4 weeks to discuss social work needs of client at that time  Materials Provided: No  The patient verbalized understanding of instructions provided today and declined a print copy of patient instruction materials.   [name].Maisey Deandrade MSW, [name] Licensed Clinical Social Worker [facility] Care [facility] [phone]

## 2020-03-29 NOTE — Chronic Care Management (AMB) (Signed)
Chronic Care Management    Clinical Social Work Follow Up Note  03/29/2020 Name: XZAVIAR MALOOF MRN: [id number] DOB: 1955-04-29  BENTZION DAURIA is a 64 y.o. year old male who is a primary care patient of [name], [name], MD. The CCM team was consulted for assistance with [name] .   Review of patient status, including review of consultants reports, other relevant assessments, and collaboration with appropriate care team members and the patient's provider was performed as part of comprehensive patient evaluation and provision of chronic care management services.    SDOH (Social Determinants of Health) assessments performed: No; risk for stress; risk for depression; risk for physical inactivity; risk for tobacco use; risk for transport needs  Flowsheet Row Chronic Care Management from 02/21/2019 in [place]  PHQ-9 Total Score 9     GAD 7 : Generalized Anxiety Score 04/10/2019 05/01/2016 03/31/2016  Nervous, Anxious, on Edge 0 1 3  Control/stop worrying 0 1 3  Worry too much - different things 0 1 3  Trouble relaxing 0 0 2  Restless 0 0 2  Easily annoyed or irritable 0 0 0  Afraid - awful might happen 0 0 0  Total GAD 7 Score 0 3 13  Anxiety Difficulty Not difficult at all Not difficult at all Not difficult at all    Outpatient Encounter Medications as of 03/29/2020  Medication Sig  . acetaminophen (TYLENOL) 325 MG tablet Take 2 tablets (650 mg total) by mouth every 6 (six) hours as needed for mild pain or headache.  . albuterol (PROAIR HFA) 108 (90 Base) MCG/ACT inhaler 1-2 puffs every 6 hours as needed wheezing or shortness of breath.  [name] aspirin EC 81 MG tablet Take 1 tablet (81 mg total) by mouth daily.  [name] atorvastatin (LIPITOR) 40 MG tablet TAKE 1 TABLET ONCE DAILY AT 6PM  . buPROPion (WELLBUTRIN SR) 150 MG 12 hr tablet Take 1 tablet (150 mg total) by mouth daily with breakfast.  . butalbital-acetaminophen-caffeine (ESGIC) 50-325-40 MG tablet  Take 1 tablet by mouth every 6 (six) hours as needed for headache.  . celecoxib (CELEBREX) 200 MG capsule Take 1 capsule (200 mg total) by mouth daily as needed.  . cetaphil (CETAPHIL) lotion Apply 1 application topically 2 (two) times daily. For rash. Apply after washing with [facility] shower gel and rinsing with clear warm water.  . DULoxetine (CYMBALTA) 60 MG capsule Take 1 capsule (60 mg total) by mouth daily. WITH A FULL STOMACH AT SUPPER TIME  . fexofenadine (ALLEGRA) 180 MG tablet Take 1 tablet (180 mg total) by mouth daily. For allergy symptoms  . fluocinonide-emollient (LIDEX-E) 0.05 % cream Apply 1 application topically 2 (two) times daily. To affected areas  . fluticasone (FLONASE) 50 MCG/ACT nasal spray Place 2 sprays into both nostrils daily as needed for allergies or rhinitis.  . fluticasone furoate-vilanterol (BREO ELLIPTA) 100-25 MCG/INH AEPB Inhale 1 puff into the lungs daily.  . Ipratropium-Albuterol (COMBIVENT RESPIMAT) 20-100 MCG/ACT AERS respimat Inhale 1 puff into the lungs every 6 (six) hours  . isosorbide mononitrate (IMDUR) 60 MG 24 hr tablet Take 1 tablet (60 mg total) by mouth daily.  . metoprolol tartrate (LOPRESSOR) 25 MG tablet Take 1 tablet (25 mg total) by mouth 2 (two) times daily.  . mirtazapine (REMERON) 15 MG tablet Take 1 tablet (15 mg total) by mouth at bedtime. For sleep  . nitroGLYCERIN (NITROSTAT) 0.4 MG SL tablet Place 1 tablet (0.4 mg total) under the tongue every  5 (five) minutes x 3 doses as needed for chest pain.  . pantoprazole (PROTONIX) 40 MG tablet TAKE  (1)  TABLET TWICE A DAY.  . tamsulosin (FLOMAX) 0.4 MG CAPS capsule Take 2 capsules (0.8 mg total) by mouth daily after supper.   Facility-Administered Encounter Medications as of 03/29/2020  Medication  . cyanocobalamin ((VITAMIN B-12)) injection 1,000 mcg     Goals    Current Barriers:   Film/video editor.   Literacy barriers  Transportation barriers in client with Chronic  Diagnoses of DDD, Depressin Apraxia of Speech, CAD GAD, Hx MI, HTN, Infantile Cerebral Palsy   Cognitive Deficits  Nurse Case Manager Clinical Goal(s):  Over the next 14 days, patient will have transportation arranged for his next  B12 injection scheduled for client for April 22, 2020 at [facility]    Interventions:   Called client and informed client that his next B12 injection appointment at [facility] was scheduled or April 22, 2020.  Talked with client about his recent appointment with Dr. Livia Snellen    Talked with client about medical order for Lift Chair for client   Patient Self Care Activities:   Currently UNABLE TO independently drive to medical appointments.   Please see past updates related to this goal by clicking on the "Past Updates" button in the selected goal     Follow Up Plan: [name] to call client in next 4 weeks to discuss social work needs of client at that time  Trev Boley.Allisa Einspahr MSW, [name] Licensed Clinical Social Worker [facility] [phone]

## 2020-03-31 ENCOUNTER — Encounter: Payer: Self-pay | Admitting: Family Medicine

## 2020-04-03 ENCOUNTER — Telehealth: Payer: Self-pay

## 2020-04-03 NOTE — Telephone Encounter (Signed)
Pt called to let Dr Livia Snellen know that he needs him to write an order so that pt can have more help from the home health aid.  Please advise and call patient.

## 2020-04-03 NOTE — Telephone Encounter (Signed)
No answer

## 2020-04-09 NOTE — Telephone Encounter (Signed)
PCS form started on patient, provider will need to fill out Section D: Change of Status: Medical, to help request additional hours. Form placed on providers desk

## 2020-04-10 ENCOUNTER — Ambulatory Visit: Payer: Self-pay | Admitting: Licensed Clinical Social Worker

## 2020-04-10 ENCOUNTER — Telehealth: Payer: Self-pay

## 2020-04-10 DIAGNOSIS — I252 Old myocardial infarction: Secondary | ICD-10-CM

## 2020-04-10 DIAGNOSIS — F411 Generalized anxiety disorder: Secondary | ICD-10-CM

## 2020-04-10 DIAGNOSIS — J441 Chronic obstructive pulmonary disease with (acute) exacerbation: Secondary | ICD-10-CM

## 2020-04-10 DIAGNOSIS — K219 Gastro-esophageal reflux disease without esophagitis: Secondary | ICD-10-CM

## 2020-04-10 DIAGNOSIS — E538 Deficiency of other specified B group vitamins: Secondary | ICD-10-CM

## 2020-04-10 DIAGNOSIS — I25118 Atherosclerotic heart disease of native coronary artery with other forms of angina pectoris: Secondary | ICD-10-CM

## 2020-04-10 DIAGNOSIS — M5136 Other intervertebral disc degeneration, lumbar region: Secondary | ICD-10-CM

## 2020-04-10 DIAGNOSIS — R482 Apraxia: Secondary | ICD-10-CM

## 2020-04-10 DIAGNOSIS — F3341 Major depressive disorder, recurrent, in partial remission: Secondary | ICD-10-CM

## 2020-04-10 DIAGNOSIS — G809 Cerebral palsy, unspecified: Secondary | ICD-10-CM

## 2020-04-10 NOTE — Chronic Care Management (AMB) (Signed)
Chronic Care Management    Clinical Social Work Follow Up Note  04/10/2020 Name: [name] MRN: [id number] DOB: Jul 22, 1955  [name] is a 64 y.o. year old male who is a primary care patient of [name], [name], MD. The CCM team was consulted for assistance with [name] .   Review of patient status, including review of consultants reports, other relevant assessments, and collaboration with appropriate care team members and the patient's provider was performed as part of comprehensive patient evaluation and provision of chronic care management services.    SDOH (Social Determinants of Health) assessments performed: No;risk for depression; risk for transport needs; risk for financial strain; risk for tobacco use; risk for physical inactivity  Flowsheet Row Chronic Care Management from 02/21/2019 in [place]  PHQ-9 Total Score 9       GAD 7 : Generalized Anxiety Score 04/10/2019 05/01/2016 03/31/2016  Nervous, Anxious, on Edge 0 1 3  Control/stop worrying 0 1 3  Worry too much - different things 0 1 3  Trouble relaxing 0 0 2  Restless 0 0 2  Easily annoyed or irritable 0 0 0  Afraid - awful might happen 0 0 0  Total GAD 7 Score 0 3 13  Anxiety Difficulty Not difficult at all Not difficult at all Not difficult at all    Outpatient Encounter Medications as of 04/10/2020  Medication Sig  . acetaminophen (TYLENOL) 325 MG tablet Take 2 tablets (650 mg total) by mouth every 6 (six) hours as needed for mild pain or headache.  . albuterol (PROAIR HFA) 108 (90 Base) MCG/ACT inhaler 1-2 puffs every 6 hours as needed wheezing or shortness of breath.  [name] aspirin EC 81 MG tablet Take 1 tablet (81 mg total) by mouth daily.  [name] atorvastatin (LIPITOR) 40 MG tablet TAKE 1 TABLET ONCE DAILY AT 6PM  . buPROPion (WELLBUTRIN SR) 150 MG 12 hr tablet Take 1 tablet (150 mg total) by mouth daily with breakfast.  . butalbital-acetaminophen-caffeine (ESGIC) 50-325-40  MG tablet Take 1 tablet by mouth every 6 (six) hours as needed for headache.  . celecoxib (CELEBREX) 200 MG capsule Take 1 capsule (200 mg total) by mouth daily as needed.  . cetaphil (CETAPHIL) lotion Apply 1 application topically 2 (two) times daily. For rash. Apply after washing with [facility] shower gel and rinsing with clear warm water.  . DULoxetine (CYMBALTA) 60 MG capsule Take 1 capsule (60 mg total) by mouth daily. WITH A FULL STOMACH AT SUPPER TIME  . fexofenadine (ALLEGRA) 180 MG tablet Take 1 tablet (180 mg total) by mouth daily. For allergy symptoms  . fluocinonide-emollient (LIDEX-E) 0.05 % cream Apply 1 application topically 2 (two) times daily. To affected areas  . fluticasone (FLONASE) 50 MCG/ACT nasal spray Place 2 sprays into both nostrils daily as needed for allergies or rhinitis.  . fluticasone furoate-vilanterol (BREO ELLIPTA) 100-25 MCG/INH AEPB Inhale 1 puff into the lungs daily.  . Ipratropium-Albuterol (COMBIVENT RESPIMAT) 20-100 MCG/ACT AERS respimat Inhale 1 puff into the lungs every 6 (six) hours  . isosorbide mononitrate (IMDUR) 60 MG 24 hr tablet Take 1 tablet (60 mg total) by mouth daily.  . metoprolol tartrate (LOPRESSOR) 25 MG tablet Take 1 tablet (25 mg total) by mouth 2 (two) times daily.  . mirtazapine (REMERON) 15 MG tablet Take 1 tablet (15 mg total) by mouth at bedtime. For sleep  . nitroGLYCERIN (NITROSTAT) 0.4 MG SL tablet Place 1 tablet (0.4 mg total) under the  tongue every 5 (five) minutes x 3 doses as needed for chest pain.  . pantoprazole (PROTONIX) 40 MG tablet TAKE  (1)  TABLET TWICE A DAY.  . tamsulosin (FLOMAX) 0.4 MG CAPS capsule Take 2 capsules (0.8 mg total) by mouth daily after supper.   Facility-Administered Encounter Medications as of 04/10/2020  Medication  . cyanocobalamin ((VITAMIN B-12)) injection 1,000 mcg     Goals    Current Barriers:   Film/video editor.   Literacy barriers  Transportation barriers in client with  Chronic Diagnoses of DDD, Depressin Apraxia of Speech, CAD GAD, Hx MI, HTN, Infantile Cerebral Palsy   Cognitive Deficits  Nurse Case Manager Clinical Goal(s):  Over the next 14 days, patient will have transportation arranged for his next  [facility]    Interventions:   Called client and informed client that his next B12 injection appointment at [facility] was scheduled or April 22, 2020.  Talked with client about his recent appointment with Dr. Livia Snellen    Talked with client about medical order for Lift Chair for client  Called [name] today and left message for representative to please schedule client transport with [name] for 04/22/2020 appointment at [facility]   Patient Self Care Activities:   Currently UNABLE TO independently drive to medical appointments.   Please see past updates related to this goal by clicking on the "Past Updates" button in the selected goal     Follow Up Plan:[name] to call client in next 4 weeks to discuss social work needs of client at that time  Romani Hegedus.Ashlynn Gunnels MSW, [name] Licensed Clinical Social Worker [facility] Care Management [phone]

## 2020-04-10 NOTE — Patient Instructions (Addendum)
Licensed Clinical Social Worker Visit Information  Goals we discussed today:     Current Barriers:   Film/video editor.   Literacy barriers  Transportation barriers in client with Chronic Diagnoses of DDD, Depressin Apraxia of Speech, CAD GAD, Hx MI, HTN, Infantile Cerebral Palsy   Cognitive Deficits  Nurse Case Manager Clinical Goal(s):  Over the next14days, patient will have transportation arranged forhis nextB12 injection [facility]    Interventions:  Called client and informed client that his next B12 injection appointment at [facility] was scheduled or April 22, 2020.  Talked with client about his recent appointment with Dr. Livia Snellen   Talked with client about medical order for Lift Chair for client  Called [name] today and left message for representative to please schedule client transport with [name] for 04/22/2020 appointment at [facility]   Patient Self Care Activities:   Currently UNABLE TO independently drive to medical appointments.   Please see past updates related to this goal by clicking on the "Past Updates" button in the selected goal     Follow Up Plan:[name] to call client in next 4 weeks to discuss social work needs of client at that time  Materials Provided: No  The patient verbalized understanding of instructions provided today and declined a print copy of patient instruction materials.   [name].Cassandra Mcmanaman MSW, [name] Licensed Clinical Social Worker [facility] Care Management [phone]

## 2020-04-11 ENCOUNTER — Ambulatory Visit: Payer: Self-pay | Admitting: Licensed Clinical Social Worker

## 2020-04-11 DIAGNOSIS — I252 Old myocardial infarction: Secondary | ICD-10-CM

## 2020-04-11 DIAGNOSIS — G809 Cerebral palsy, unspecified: Secondary | ICD-10-CM

## 2020-04-11 DIAGNOSIS — M5136 Other intervertebral disc degeneration, lumbar region: Secondary | ICD-10-CM

## 2020-04-11 DIAGNOSIS — R482 Apraxia: Secondary | ICD-10-CM

## 2020-04-11 DIAGNOSIS — F3341 Major depressive disorder, recurrent, in partial remission: Secondary | ICD-10-CM

## 2020-04-11 DIAGNOSIS — I25118 Atherosclerotic heart disease of native coronary artery with other forms of angina pectoris: Secondary | ICD-10-CM

## 2020-04-11 DIAGNOSIS — K219 Gastro-esophageal reflux disease without esophagitis: Secondary | ICD-10-CM

## 2020-04-11 DIAGNOSIS — F411 Generalized anxiety disorder: Secondary | ICD-10-CM

## 2020-04-11 DIAGNOSIS — M51369 Other intervertebral disc degeneration, lumbar region without mention of lumbar back pain or lower extremity pain: Secondary | ICD-10-CM

## 2020-04-11 DIAGNOSIS — E538 Deficiency of other specified B group vitamins: Secondary | ICD-10-CM

## 2020-04-11 NOTE — Chronic Care Management (AMB) (Signed)
Chronic Care Management    Clinical Social Work Follow Up Note  04/11/2020 Name: [name] MRN: [id number] DOB: October 08, 1955  [name] is a 64 y.o. year old male who is a primary care patient of [name], [name], MD. The CCM team was consulted for assistance with [name] .   Review of patient status, including review of consultants reports, other relevant assessments, and collaboration with appropriate care team members and the patient's provider was performed as part of comprehensive patient evaluation and provision of chronic care management services.    SDOH (Social Determinants of Health) assessments performed: No; risk for depression ; risk for tobacco use; risk for physical inactivity; risk for transport needs; risk for financial strain  Flowsheet Row Chronic Care Management from 02/21/2019 in [place]  PHQ-9 Total Score 9       GAD 7 : Generalized Anxiety Score 04/10/2019 05/01/2016 03/31/2016  Nervous, Anxious, on Edge 0 1 3  Control/stop worrying 0 1 3  Worry too much - different things 0 1 3  Trouble relaxing 0 0 2  Restless 0 0 2  Easily annoyed or irritable 0 0 0  Afraid - awful might happen 0 0 0  Total GAD 7 Score 0 3 13  Anxiety Difficulty Not difficult at all Not difficult at all Not difficult at all    Outpatient Encounter Medications as of 04/11/2020  Medication Sig  . acetaminophen (TYLENOL) 325 MG tablet Take 2 tablets (650 mg total) by mouth every 6 (six) hours as needed for mild pain or headache.  . albuterol (PROAIR HFA) 108 (90 Base) MCG/ACT inhaler 1-2 puffs every 6 hours as needed wheezing or shortness of breath.  [name] aspirin EC 81 MG tablet Take 1 tablet (81 mg total) by mouth daily.  [name] atorvastatin (LIPITOR) 40 MG tablet TAKE 1 TABLET ONCE DAILY AT 6PM  . buPROPion (WELLBUTRIN SR) 150 MG 12 hr tablet Take 1 tablet (150 mg total) by mouth daily with breakfast.  . butalbital-acetaminophen-caffeine (ESGIC)  50-325-40 MG tablet Take 1 tablet by mouth every 6 (six) hours as needed for headache.  . celecoxib (CELEBREX) 200 MG capsule Take 1 capsule (200 mg total) by mouth daily as needed.  . cetaphil (CETAPHIL) lotion Apply 1 application topically 2 (two) times daily. For rash. Apply after washing with [facility] shower gel and rinsing with clear warm water.  . DULoxetine (CYMBALTA) 60 MG capsule Take 1 capsule (60 mg total) by mouth daily. WITH A FULL STOMACH AT SUPPER TIME  . fexofenadine (ALLEGRA) 180 MG tablet Take 1 tablet (180 mg total) by mouth daily. For allergy symptoms  . fluocinonide-emollient (LIDEX-E) 0.05 % cream Apply 1 application topically 2 (two) times daily. To affected areas  . fluticasone (FLONASE) 50 MCG/ACT nasal spray Place 2 sprays into both nostrils daily as needed for allergies or rhinitis.  . fluticasone furoate-vilanterol (BREO ELLIPTA) 100-25 MCG/INH AEPB Inhale 1 puff into the lungs daily.  . Ipratropium-Albuterol (COMBIVENT RESPIMAT) 20-100 MCG/ACT AERS respimat Inhale 1 puff into the lungs every 6 (six) hours  . isosorbide mononitrate (IMDUR) 60 MG 24 hr tablet Take 1 tablet (60 mg total) by mouth daily.  . metoprolol tartrate (LOPRESSOR) 25 MG tablet Take 1 tablet (25 mg total) by mouth 2 (two) times daily.  . mirtazapine (REMERON) 15 MG tablet Take 1 tablet (15 mg total) by mouth at bedtime. For sleep  . nitroGLYCERIN (NITROSTAT) 0.4 MG SL tablet Place 1 tablet (0.4 mg total)  under the tongue every 5 (five) minutes x 3 doses as needed for chest pain.  . pantoprazole (PROTONIX) 40 MG tablet TAKE  (1)  TABLET TWICE A DAY.  . tamsulosin (FLOMAX) 0.4 MG CAPS capsule Take 2 capsules (0.8 mg total) by mouth daily after supper.   Facility-Administered Encounter Medications as of 04/11/2020  Medication  . cyanocobalamin ((VITAMIN B-12)) injection 1,000 mcg     Goals    Current Barriers:   Film/video editor.   Literacy barriers  Transportation barriers in  client with Chronic Diagnoses of DDD, Depressin Apraxia of Speech, CAD GAD, Hx MI, HTN, Infantile Cerebral Palsy   Cognitive Deficits  Nurse Case Manager Clinical Goal(s):  Over the next14days, patient will have transportation arranged forhis nextB12 injection scheduled [facility]    Interventions:  Called client and informed client that his next B12 injection appointment at [facility] was scheduled or April 25, 2020 at 11:30 AM  Talked with client about his recent appointment with Dr. Livia Snellen   Talked with client about medical order for Lift Chair for client  Called [name] today and left message for representative to please schedule client transport with [name] for 04/25/2020 for 11:30 AM client appointment at [facility]   Patient Self Care Activities:   Currently UNABLE TO independently drive to medical appointments.   Please see past updates related to this goal by clicking on the "Past Updates" button in the selected goal     Follow Up Plan:[name] to call client in next 4 weeks to discuss social work needs of client at that time  Nassim Cosma.Tashiana Lamarca MSW, [name] Licensed Clinical Social Worker [facility] Care Management [phone]

## 2020-04-11 NOTE — Patient Instructions (Addendum)
Licensed Clinical Social Worker Visit Information  Goals we discussed today:   Current Barriers:   Film/video editor.   Literacy barriers  Transportation barriers in client with Chronic Diagnoses of DDD, Depressin Apraxia of Speech, CAD GAD, Hx MI, HTN, Infantile Cerebral Palsy   Cognitive Deficits  Nurse Case Manager Clinical Goal(s):  Over the next14days, patient will have transportation arranged forhis nextB12 injection scheduled [facility]    Interventions:  Called client and informed client that his next B12 injection appointment at [facility] was scheduled or April 25, 2020 at 11:30 AM  Talked with client about his recent appointment with Dr. Livia Snellen   Talked with client about medical order for Lift Chair for client  Called [name] today and left message for representative to please schedule client transport with [name] for 04/25/2020 for 11:30 AM client appointment at [facility]   Patient Self Care Activities:   Currently UNABLE TO independently drive to medical appointments.   Please see past updates related to this goal by clicking on the "Past Updates" button in the selected goal     Follow Up Plan:[name] to call client in next 4 weeks to discuss social work needs of client at that time  Materials Provided: No  The patient verbalized understanding of instructions provided today and declined a print copy of patient instruction materials.   [name].[name] MSW, [name] Licensed Clinical Social Worker [facility] Care Management [phone]

## 2020-04-16 ENCOUNTER — Telehealth: Payer: Self-pay | Admitting: *Deleted

## 2020-04-16 ENCOUNTER — Ambulatory Visit: Payer: Medicare Other | Admitting: *Deleted

## 2020-04-16 DIAGNOSIS — J441 Chronic obstructive pulmonary disease with (acute) exacerbation: Secondary | ICD-10-CM

## 2020-04-16 DIAGNOSIS — G809 Cerebral palsy, unspecified: Secondary | ICD-10-CM

## 2020-04-16 DIAGNOSIS — I10 Essential (primary) hypertension: Secondary | ICD-10-CM

## 2020-04-16 NOTE — Telephone Encounter (Signed)
LIFT chair RX re-sent today ( from a separate message)   DR Darlyn Read, please address other questions.

## 2020-04-16 NOTE — Chronic Care Management (AMB) (Signed)
Chronic Care Management   Follow Up Note   04/16/2020 Name: [name] MRN: [id number] DOB: 1956-01-07  Referred by: Mechele Claude, MD Reason for referral : Chronic Care Management (RN follow up)   [name] is a 64 y.o. year old male who is a primary care patient of [name], Broadus John, MD. The CCM team was consulted for assistance with chronic disease management and care coordination needs.    Review of patient status, including review of consultants reports, relevant laboratory and other test results, and collaboration with appropriate care team members and the patient's provider was performed as part of comprehensive patient evaluation and provision of chronic care management services.    SDOH (Social Determinants of Health) assessments performed: Yes: transportation See Care Plan activities for detailed interventions related to [facility])     Outpatient Encounter Medications as of 04/16/2020  Medication Sig  . acetaminophen (TYLENOL) 325 MG tablet Take 2 tablets (650 mg total) by mouth every 6 (six) hours as needed for mild pain or headache.  . albuterol (PROAIR HFA) 108 (90 Base) MCG/ACT inhaler 1-2 puffs every 6 hours as needed wheezing or shortness of breath.  [name] aspirin EC 81 MG tablet Take 1 tablet (81 mg total) by mouth daily.  [name] atorvastatin (LIPITOR) 40 MG tablet TAKE 1 TABLET ONCE DAILY AT 6PM  . buPROPion (WELLBUTRIN SR) 150 MG 12 hr tablet Take 1 tablet (150 mg total) by mouth daily with breakfast.  . butalbital-acetaminophen-caffeine (ESGIC) 50-325-40 MG tablet Take 1 tablet by mouth every 6 (six) hours as needed for headache.  . celecoxib (CELEBREX) 200 MG capsule Take 1 capsule (200 mg total) by mouth daily as needed.  . cetaphil (CETAPHIL) lotion Apply 1 application topically 2 (two) times daily. For rash. Apply after washing with [facility] shower gel and rinsing with clear warm water.  . DULoxetine (CYMBALTA) 60 MG capsule Take 1 capsule (60 mg total) by mouth  daily. WITH A FULL STOMACH AT SUPPER TIME  . fexofenadine (ALLEGRA) 180 MG tablet Take 1 tablet (180 mg total) by mouth daily. For allergy symptoms  . fluocinonide-emollient (LIDEX-E) 0.05 % cream Apply 1 application topically 2 (two) times daily. To affected areas  . fluticasone (FLONASE) 50 MCG/ACT nasal spray Place 2 sprays into both nostrils daily as needed for allergies or rhinitis.  . fluticasone furoate-vilanterol (BREO ELLIPTA) 100-25 MCG/INH AEPB Inhale 1 puff into the lungs daily.  . Ipratropium-Albuterol (COMBIVENT RESPIMAT) 20-100 MCG/ACT AERS respimat Inhale 1 puff into the lungs every 6 (six) hours  . isosorbide mononitrate (IMDUR) 60 MG 24 hr tablet Take 1 tablet (60 mg total) by mouth daily.  . metoprolol tartrate (LOPRESSOR) 25 MG tablet Take 1 tablet (25 mg total) by mouth 2 (two) times daily.  . mirtazapine (REMERON) 15 MG tablet Take 1 tablet (15 mg total) by mouth at bedtime. For sleep  . nitroGLYCERIN (NITROSTAT) 0.4 MG SL tablet Place 1 tablet (0.4 mg total) under the tongue every 5 (five) minutes x 3 doses as needed for chest pain.  . pantoprazole (PROTONIX) 40 MG tablet TAKE  (1)  TABLET TWICE A DAY.  . tamsulosin (FLOMAX) 0.4 MG CAPS capsule Take 2 capsules (0.8 mg total) by mouth daily after supper.   Facility-Administered Encounter Medications as of 04/16/2020  Medication  . cyanocobalamin ((VITAMIN B-12)) injection 1,000 mcg     Goals Addressed              This Visit's Progress     Patient  Stated   .  "I need help getting another lift chair" (pt-stated)        CARE PLAN ENTRY (see longitudinal plan of care for additional care plan information)  Current Barriers:  . Care Coordination needs related to lift chair DME order in a patient with infantile cerebral palsy, CAD, hypertension, and frequent falls.   Nurse Case Manager Clinical Goal(s):  [name] Over the next 30 days, patient will work with DME supplier regarding order for lift chair  Interventions:   . Inter-disciplinary care team collaboration (see longitudinal plan of care) . Chart reviewed including relevant office notes and orders o Face-to-face office visit with Dr Livia Snellen on 03/28/20 o Order for DME lift chair was placed o Order was re-faxed to [facility] in [place] per patient's request on 04/10/20 . Outgoing call to [facility] in [place] to check on order status o They only file Part B Medicare and not [facility] Medicare or Medicaid . RN Care Manager will update [place] clinical staff and request that they send order to another DME supplier like [facility]  Patient Self Care Activities:  . Has an aide that comes in daily to help with some ADLs . Needs assistance with transportation  Please see past updates related to this goal by clicking on the "Past Updates" button in the selected goal       .  Recurrent Falls & Fear of Falls (pt-stated)        CARE PLAN ENTRY (see longitudinal plan of care for additional care plan information)  Current Barriers:  . Care Coordination needs related to new onset of recurrent falls in a patient with infantile cerebral palsy, HTN, and COPD (disease states) . Cognitive Deficits  Nurse Case Manager Clinical Goal(s):  [name] Over the next 30 days, patient will reach out to [facility] or PCP with any new or worsening symptoms . Over the next 30 days, patient will demonstrate understanding of fall prevention strategies as evidenced by a decrease in falls . Over the next 30 days, patient will work with home health agency and PCP to arrange home health PT for strength and gait training  Interventions:  . Inter-disciplinary care team collaboration (see longitudinal plan of care) . Chart reviewed including recent office notes and lab results . Medications reviewed with patient . Discussed falls o Per patient, his legs just feel weak and he falls.  o No injuries with falls o Recovers after sitting for a few minutes . Discussed possible causes for  falls . Encouraged patient to change positions slowly and to stand for 30 seconds or so after arising from a seated position before he starts to walk . Discussed that falls are affecting his ability to perform ADLs and IADLs o He's afraid to go with his sister grocery shopping due to recurrent falls so he's staying at home all of the time . Discussed fall prevention o Using cane for ambulation . Telephone message sent to PCP/clinical staff requesting order for home health PT if appropriate and if they are able to use the face-to-face office note from visit with Dr Livia Snellen on 03/28/20 . Discussed patient's desire for increased hours for his aide to work with him o Chart reviewed and patient advised that order for more hours was sent to [facility] in Oct 2021 o RNCM will f/u with [facility] regarding order for increased hours . Encouraged patient to talk with [name] regarding psychosocial issues . Encouraged patient to reach out to PCP or RNCM with  any new symptoms and to call if he experiences any additional falls . Encouraged patient to keep cell phone with him at all times  Patient Self Care Activities:  . Has an aide that comes in daily to help with some ADLs . Does not drive and has assistance from sisterand [name] . Takes prepackaged medications as directed . Calls PCP with any medical concerns  Please see past updates related to this goal by clicking on the "Past Updates" button in the selected goal        Other   .  "I need transportation setup for my appointment"        [place] (see longitudinal plan of care for additional care plan information)  Current Barriers:  . Literacy barriers . Transportation barriers . Cognitive Deficits  Nurse Case Manager Clinical Goal(s):  [name] Over the next 7 days, patient will have transportation to [facility] for B12 injection  Interventions:  . Inter-disciplinary care team collaboration (see longitudinal plan of care) . Chart reviewed  including relevant office notes and upcoming appointments . Collaborated with [name] regarding scheduling with [name] . Collaborated with [facility] Triage Nurse to schedule B12 injection for 04/25/20 at 11:30 . Patient aware of appointment and aware that [name] will pick him up at 11:00 that morning . Encouraged patient to reach out to [facility] team as needed  Patient Self Care Activities:  . Performs ADL's independently . Unable to independently drive  Please see past updates related to this goal by clicking on the "Past Updates" button in the selected goal           Plan:  Lane will follow-up with patient regarding order for lift chair, increased aide hours, and home health PT over the next 14 days.  The patient has been provided with contact information for the care management team and has been advised to call with any health related questions or concerns.  Patient has an appointment for B12 injection at [facility] on 04/26/19 at 11:30. [name] will pick him up at 11:00 Patient will reach out to PCP or CCM Team with any new concerns   [name], BSN, RN-BC [place] [facility] [place] Management Direct Dial: [phone]

## 2020-04-16 NOTE — Patient Instructions (Signed)
Visit Information  Goals Addressed              This Visit's Progress     Patient Stated   .  "I need help getting another lift chair" (pt-stated)        CARE PLAN ENTRY (see longitudinal plan of care for additional care plan information)  Current Barriers:  . Care Coordination needs related to lift chair DME order in a patient with infantile cerebral palsy, CAD, hypertension, and frequent falls.   Nurse Case Manager Clinical Goal(s):  [name] Over the next 30 days, patient will work with DME supplier regarding order for lift chair  Interventions:  . Inter-disciplinary care team collaboration (see longitudinal plan of care) . Chart reviewed including relevant office notes and orders o Face-to-face office visit with Dr Darlyn Read on 03/28/20 o Order for DME lift chair was placed o Order was re-faxed to [facility] in [place] per patient's request on 04/10/20 . Outgoing call to [facility] in [place] to check on order status o They only file Part B Medicare and not [facility] Medicare or Medicaid . RN Care Manager will update [facility] clinical staff and request that they send order to another DME supplier like [facility]  Patient Self Care Activities:  . Has an aide that comes in daily to help with some ADLs . Needs assistance with transportation  Please see past updates related to this goal by clicking on the "Past Updates" button in the selected goal       .  Recurrent Falls & Fear of Falls (pt-stated)        CARE PLAN ENTRY (see longitudinal plan of care for additional care plan information)  Current Barriers:  . Care Coordination needs related to new onset of recurrent falls in a patient with infantile cerebral palsy, HTN, and COPD (disease states) . Cognitive Deficits  Nurse Case Manager Clinical Goal(s):  [name] Over the next 30 days, patient will reach out to [facility] or PCP with any new or worsening symptoms . Over the next 30 days, patient will demonstrate understanding of fall  prevention strategies as evidenced by a decrease in falls . Over the next 30 days, patient will work with home health agency and PCP to arrange home health PT for strength and gait training  Interventions:  . Inter-disciplinary care team collaboration (see longitudinal plan of care) . Chart reviewed including recent office notes and lab results . Medications reviewed with patient . Discussed falls o Per patient, his legs just feel weak and he falls.  o No injuries with falls o Recovers after sitting for a few minutes . Discussed possible causes for falls . Encouraged patient to change positions slowly and to stand for 30 seconds or so after arising from a seated position before he starts to walk . Discussed that falls are affecting his ability to perform ADLs and IADLs o He's afraid to go with his sister grocery shopping due to recurrent falls so he's staying at home all of the time . Discussed fall prevention o Using cane for ambulation . Telephone message sent to PCP/clinical staff requesting order for home health PT if appropriate and if they are able to use the face-to-face office note from visit with Dr Darlyn Read on 03/28/20 . Discussed patient's desire for increased hours for his aide to work with him o Chart reviewed and patient advised that order for more hours was sent to [facility] in Oct 2021 o RNCM will f/u with [facility] regarding  order for increased hours . Encouraged patient to talk with [name] regarding psychosocial issues . Encouraged patient to reach out to PCP or RNCM with any new symptoms and to call if he experiences any additional falls . Encouraged patient to keep cell phone with him at all times  Patient Self Care Activities:  . Has an aide that comes in daily to help with some ADLs . Does not drive and has assistance from sisterand [name] . Takes prepackaged medications as directed . Calls PCP with any medical concerns  Please see past updates related to this  goal by clicking on the "Past Updates" button in the selected goal        Other   .  "I need transportation setup for my appointment"        CARE PLAN ENTRY (see longitudinal plan of care for additional care plan information)  Current Barriers:  . Literacy barriers . Transportation barriers . Cognitive Deficits  Nurse Case Manager Clinical Goal(s):  [name] Over the next 7 days, patient will have transportation to [name] for B12 injection  Interventions:  . Inter-disciplinary care team collaboration (see longitudinal plan of care) . Chart reviewed including relevant office notes and upcoming appointments . Collaborated with [name] regarding scheduling with [name] . Collaborated with [facility] Triage Nurse to schedule B12 injection for 04/25/20 at 11:30 . Patient aware of appointment and aware that [name] will pick him up at 11:00 that morning . Encouraged patient to reach out to [facility] team as needed  Patient Self Care Activities:  . Performs ADL's independently . Unable to independently drive  Please see past updates related to this goal by clicking on the "Past Updates" button in the selected goal         The patient verbalized understanding of instructions, educational materials, and care plan provided today and declined offer to receive copy of patient instructions, educational materials, and care plan.   Follow-up Plan:  RN Care Manager will follow-up with patient regarding order for lift chair, increased aide hours, and home health PT over the next 14 days.  The patient has been provided with contact information for the care management team and has been advised to call with any health related questions or concerns.  Patient has an appointment for B12 injection at [facility] on 04/26/19 at 11:30. [name] will pick him up at 11:00 Patient will reach out to PCP or CCM Team with any new concerns   Demetrios Loll, BSN, RN-BC Embedded Chronic Care Manager [facility] / [facility] Care  Management Direct Dial: [phone]

## 2020-04-16 NOTE — Telephone Encounter (Signed)
The number that the lift chair order was faxed to today is [facility] and they do not file his insurance. Can you try [employer] or another DME Supplier that will file his insurance?  [name], BSN, RN-BC Embedded Chronic Care Manager [facility] / [facility] Care Management Direct Dial: [phone]

## 2020-04-16 NOTE — Telephone Encounter (Addendum)
04/16/2020  Talked with patient today about lower extremity weakness and recurrent falls. He is using a cane for ambulation most of the time. He is not going out to grocery shop or anything else due to fear of falling. Last PCP visit was on 03/28/20 and focused on weakness, falls, and need for DME lift chair.  If provider feels it's appropriate, can we order home health PT for strength and gait training based on last office visit?  Also order for lift chair was sent to [facility] in [place]. Patient has not heard from them so I gave them a call. They only file Medicare Part B and not [employer] MCR and MCD. Can his order for a lift chair be sent to [employer] or another DME supplier that files both of his insurances?  Forwarding to [facility] clinical staff.  Demetrios Loll, BSN, RN-BC Embedded Chronic Care Manager [facility] / [facility] Care Management Direct Dial: [phone]

## 2020-04-16 NOTE — Telephone Encounter (Signed)
HE would need a new face to face visit for home PT

## 2020-04-16 NOTE — Telephone Encounter (Signed)
Order re-printed and face to face note attached - will re-fax to # given.

## 2020-04-18 NOTE — Telephone Encounter (Signed)
The pt was seen for falls at his last visit, could you please addend note and add PT order? Thank you Faxed lift chair order to [facility]

## 2020-04-18 NOTE — Addendum Note (Signed)
Addended by: Mechele Claude on: 04/18/2020 12:36 PM   Modules accepted: Orders

## 2020-04-18 NOTE — Telephone Encounter (Signed)
I added PT order through home health. In case that doesn't work I did one for outpt too.

## 2020-04-22 ENCOUNTER — Ambulatory Visit: Payer: Medicare Other

## 2020-04-24 ENCOUNTER — Ambulatory Visit: Payer: Medicare Other | Admitting: *Deleted

## 2020-04-24 ENCOUNTER — Ambulatory Visit: Payer: Self-pay | Admitting: Licensed Clinical Social Worker

## 2020-04-24 DIAGNOSIS — J441 Chronic obstructive pulmonary disease with (acute) exacerbation: Secondary | ICD-10-CM

## 2020-04-24 DIAGNOSIS — G809 Cerebral palsy, unspecified: Secondary | ICD-10-CM

## 2020-04-24 DIAGNOSIS — I252 Old myocardial infarction: Secondary | ICD-10-CM

## 2020-04-24 DIAGNOSIS — F411 Generalized anxiety disorder: Secondary | ICD-10-CM

## 2020-04-24 DIAGNOSIS — F3341 Major depressive disorder, recurrent, in partial remission: Secondary | ICD-10-CM

## 2020-04-24 DIAGNOSIS — I10 Essential (primary) hypertension: Secondary | ICD-10-CM

## 2020-04-24 DIAGNOSIS — R531 Weakness: Secondary | ICD-10-CM

## 2020-04-24 DIAGNOSIS — Z9181 History of falling: Secondary | ICD-10-CM

## 2020-04-24 DIAGNOSIS — I25118 Atherosclerotic heart disease of native coronary artery with other forms of angina pectoris: Secondary | ICD-10-CM

## 2020-04-24 DIAGNOSIS — Z550 Illiteracy and low-level literacy: Secondary | ICD-10-CM

## 2020-04-24 DIAGNOSIS — K219 Gastro-esophageal reflux disease without esophagitis: Secondary | ICD-10-CM

## 2020-04-24 DIAGNOSIS — E538 Deficiency of other specified B group vitamins: Secondary | ICD-10-CM

## 2020-04-24 DIAGNOSIS — M51369 Other intervertebral disc degeneration, lumbar region without mention of lumbar back pain or lower extremity pain: Secondary | ICD-10-CM

## 2020-04-24 DIAGNOSIS — M5136 Other intervertebral disc degeneration, lumbar region: Secondary | ICD-10-CM

## 2020-04-24 DIAGNOSIS — G40909 Epilepsy, unspecified, not intractable, without status epilepticus: Secondary | ICD-10-CM

## 2020-04-24 DIAGNOSIS — R482 Apraxia: Secondary | ICD-10-CM

## 2020-04-24 NOTE — Patient Instructions (Signed)
Visit Information  Goals Addressed              This Visit's Progress     Patient Stated   .  "I need help getting another lift chair" (pt-stated)        CARE PLAN ENTRY (see longitudinal plan of care for additional care plan information)  Current Barriers:  . Care Coordination needs related to lift chair DME order in a patient with infantile cerebral palsy, CAD, hypertension, and frequent falls.  . Financial constraints  Nurse Case Manager Clinical Goal(s):  [name] Over the next 30 days, patient will work with DME supplier regarding order for lift chair  Interventions:  . Inter-disciplinary care team collaboration (see longitudinal plan of care) . Chart reviewed including relevant office notes and orders . Consulted by [name] regarding lift chair . [facility] to get an understanding of what they told the patient o Patient will have to purchase outright ($900-$2000) and then they can file insurance for him. It will be up to the insurance company whether they will reimburse him for the cost of the chair. [name] Previously talked with [facility]. Their cost is similar and they do not file his insurance.  . Discussed financial constraints. Patient unable to pay this amount.  . Explained all of this to patient and answered questions.  . Placed referral to [facility] Care Guides to see if they know of any organizations that can provide assistance with either a new or used lift chair o Patient advised that he will receive a call from a [place] . Encouraged patient to reach out to [facility] Manager as needed  Patient Self Care Activities:  . Has an aide that comes in daily to help with some ADLs . Needs assistance with transportation  Please see past updates related to this goal by clicking on the "Past Updates" button in the selected goal       .  "I need to schedule a Covid vaccine" (pt-stated)        Current Barriers:  . Care Coordination needs related to Covid Vaccination in  a patient with COPD and HTN . Cognitive Deficits . Unable to independently drive  Nurse Case Manager Clinical Goal(s):  [name] Over the next 30 days, patient will receive Covid Vaccine  Interventions:  . 1:1 collaboration with [name], MD regarding development and update of comprehensive plan of care as evidenced by provider attestation and co-signature . Inter-disciplinary care team collaboration (see longitudinal plan of care) . Chart reviewed . Talked with patient about desire to schedule 1st Covid vaccine . Collaborated with [facility] clinical staff o They can administer the vaccine at his appointment with Dr Livia Snellen on 05/15/20. They are only administered on Wednesdays.  . Advised patient that he will receive vaccine at his appointment on 05/15/20. . Questions answered . Collaborated with [name] to arrange transportation to appt . Encouraged patient to reach out to [facility] as needed         .  Recurrent Falls & Fear of Falls (pt-stated)        CARE PLAN ENTRY (see longitudinal plan of care for additional care plan information)  Current Barriers:  . Care Coordination needs related to new onset of recurrent falls in a patient with infantile cerebral palsy, HTN, and COPD (disease states) . Cognitive Deficits  Nurse Case Manager Clinical Goal(s):  [name] Over the next 30 days, patient will reach out to [facility] or PCP with any new  or worsening symptoms . Over the next 30 days, patient will demonstrate understanding of fall prevention strategies as evidenced by a decrease in falls . Over the next 30 days, patient will work with home health agency and PCP to arrange home health PT for strength and gait training  Interventions:  . Inter-disciplinary care team collaboration (see longitudinal plan of care) . Chart reviewed including recent office notes and lab results . Medications reviewed and discussed. No recent medication changes.  . Discussed falls . Discussed weakness and potential  causes for falls . Encouraged patient to use cane for all ambulation . Encouraged patient to change positions slowly and to stand for 30 seconds or so after arising from a seated position before he starts to walk . Discussed that falls are affecting his ability to perform ADLs and IADLs . Collaborated with [facility] clinical staff to schedule visit with PCP for 05/15/20 for evaluation to order [facility] physical therapy and to assess for medical cause for weakness and falls . Asked patient to have his aid check and record his blood pressure daily and PRN and to bring that log with him to his appointment . Encouraged patient to talk with [name] regarding psychosocial issues . Encouraged patient to reach out to PCP or RNCM with any new symptoms and to call if he experiences any additional falls . Encouraged patient to keep cell phone with him at all times  Patient Self Care Activities:  . Has an aide that comes in daily to help with some ADLs . Does not drive and has assistance from sister and [name] . Takes prepackaged medications as directed . Calls PCP with any medical concerns  Please see past updates related to this goal by clicking on the "Past Updates" button in the selected goal         The patient verbalized understanding of instructions, educational materials, and care plan provided today and declined offer to receive copy of patient instructions, educational materials, and care plan.   Plan:  The patient has been provided with contact information for the care management team and has been advised to call with any health related questions or concerns.  The care management team will reach out to the patient again over the next 30 days.  Next PCP appointment scheduled for: 05/15/20 with Dr Darlyn Read  Demetrios Loll, BSN, RN-BC Embedded Chronic Care Manager [facility] / [facility] Care Management Direct Dial: [phone]

## 2020-04-24 NOTE — Patient Instructions (Addendum)
Licensed Clinical Social Worker Visit Information  Materials Provided: No  04/24/2020  Name: [name]          MRN: [id number]       DOB: 1955-07-03  [name] is a 65 y.o. year old male who is a primary care patient of [name], Broadus John, MD. The CCM team was consulted for assistance with [employer] .   Review of patient status, including review of consultants reports, other relevant assessments, and collaboration with appropriate care team members and the patient's provider was performed as part of comprehensive patient evaluation and provision of chronic care management services.    SDOH (Social Determinants of Health) assessments performed: No; risk for tobacco use; risk for depression; risk for transport needs; risk for physical inactivity  [name] called client today and spoke via phone with client today about client needs. Client has been asking about COVID 19 vaccine.  [name] collaborated with [facility] about client questions about COVID 19 vaccine. Client also has been calling RNCM and [name] multiple times about Lift Chair. Dr. Darlyn Read has sent script for Lift Chair to [facility]. Client and his home health aide traveled to [facility] recently to talk with representative about Retail buyer.  Representative of [employer] told client that he would have to pay between $ 900.00 to $ 2,000.00 for his part of the cost of such a Retail buyer. Client said he could not afford to pay these costs. RNCM and [name] collaborated today again about Retail buyer issue for client. Client is scheduled for [name] to provide transport for him tomorrow to come to [facility] for his monthly B12 injection. [name] informed client of these transport arrangements.  Follow Up Plan: [name] to call client in next 4 weeks to talk with client about social work needs of client at that time  The patient verbalized understanding of instructions provided today and declined a print copy of patient instruction  materials.   Kelton Pillar.Stepfanie Yott MSW, [name] Licensed Clinical Social Worker [facility] Care Management [phone]

## 2020-04-24 NOTE — Chronic Care Management (AMB) (Signed)
Chronic Care Management   Follow Up Note   04/24/2020 Name: [name] MRN: [id number] DOB: 14-Jan-1956  Referred by: Mechele Claude, MD Reason for referral : Chronic Care Management (RN follow up)   [name] is a 65 y.o. year old male who is a primary care patient of [name], Broadus John, MD. The CCM team was consulted for assistance with chronic disease management and care coordination needs.    Review of patient status, including review of consultants reports, relevant laboratory and other test results, and collaboration with appropriate care team members and the patient's provider was performed as part of comprehensive patient evaluation and provision of chronic care management services.    SDOH (Social Determinants of Health) assessments performed: Yes: transportation See Care Plan activities for detailed interventions related to [facility])     Outpatient Encounter Medications as of 04/24/2020  Medication Sig  . acetaminophen (TYLENOL) 325 MG tablet Take 2 tablets (650 mg total) by mouth every 6 (six) hours as needed for mild pain or headache.  . albuterol (PROAIR HFA) 108 (90 Base) MCG/ACT inhaler 1-2 puffs every 6 hours as needed wheezing or shortness of breath.  [name] aspirin EC 81 MG tablet Take 1 tablet (81 mg total) by mouth daily.  [name] atorvastatin (LIPITOR) 40 MG tablet TAKE 1 TABLET ONCE DAILY AT 6PM  . buPROPion (WELLBUTRIN SR) 150 MG 12 hr tablet Take 1 tablet (150 mg total) by mouth daily with breakfast.  . butalbital-acetaminophen-caffeine (ESGIC) 50-325-40 MG tablet Take 1 tablet by mouth every 6 (six) hours as needed for headache.  . celecoxib (CELEBREX) 200 MG capsule Take 1 capsule (200 mg total) by mouth daily as needed.  . cetaphil (CETAPHIL) lotion Apply 1 application topically 2 (two) times daily. For rash. Apply after washing with [facility] shower gel and rinsing with clear warm water.  . DULoxetine (CYMBALTA) 60 MG capsule Take 1 capsule (60 mg total) by mouth daily.  WITH A FULL STOMACH AT SUPPER TIME  . fexofenadine (ALLEGRA) 180 MG tablet Take 1 tablet (180 mg total) by mouth daily. For allergy symptoms  . fluocinonide-emollient (LIDEX-E) 0.05 % cream Apply 1 application topically 2 (two) times daily. To affected areas  . fluticasone (FLONASE) 50 MCG/ACT nasal spray Place 2 sprays into both nostrils daily as needed for allergies or rhinitis.  . fluticasone furoate-vilanterol (BREO ELLIPTA) 100-25 MCG/INH AEPB Inhale 1 puff into the lungs daily.  . Ipratropium-Albuterol (COMBIVENT RESPIMAT) 20-100 MCG/ACT AERS respimat Inhale 1 puff into the lungs every 6 (six) hours  . isosorbide mononitrate (IMDUR) 60 MG 24 hr tablet Take 1 tablet (60 mg total) by mouth daily.  . metoprolol tartrate (LOPRESSOR) 25 MG tablet Take 1 tablet (25 mg total) by mouth 2 (two) times daily.  . mirtazapine (REMERON) 15 MG tablet Take 1 tablet (15 mg total) by mouth at bedtime. For sleep  . nitroGLYCERIN (NITROSTAT) 0.4 MG SL tablet Place 1 tablet (0.4 mg total) under the tongue every 5 (five) minutes x 3 doses as needed for chest pain.  . pantoprazole (PROTONIX) 40 MG tablet TAKE  (1)  TABLET TWICE A DAY.  . tamsulosin (FLOMAX) 0.4 MG CAPS capsule Take 2 capsules (0.8 mg total) by mouth daily after supper.   Facility-Administered Encounter Medications as of 04/24/2020  Medication  . cyanocobalamin ((VITAMIN B-12)) injection 1,000 mcg     Objective:   Goals Addressed              This Visit's Progress  Patient Stated   .  "I need help getting another lift chair" (pt-stated)        CARE PLAN ENTRY (see longitudinal plan of care for additional care plan information)  Current Barriers:  . Care Coordination needs related to lift chair DME order in a patient with infantile cerebral palsy, CAD, hypertension, and frequent falls.  . Financial constraints  Nurse Case Manager Clinical Goal(s):  [name] Over the next 30 days, patient will work with DME supplier regarding order for  lift chair  Interventions:  . Inter-disciplinary care team collaboration (see longitudinal plan of care) . Chart reviewed including relevant office notes and orders . Consulted by [name] regarding lift chair . [place] to get an understanding of what they told the patient o Patient will have to purchase outright ($900-$2000) and then they can file insurance for him. It will be up to the insurance company whether they will reimburse him for the cost of the chair. [name] Previously talked with [facility]. Their cost is similar and they do not file his insurance.  . Discussed financial constraints. Patient unable to pay this amount.  . Explained all of this to patient and answered questions.  . Placed referral to [facility] to see if they know of any organizations that can provide assistance with either a new or used lift chair o Patient advised that he will receive a call from a [place] . Encouraged patient to reach out to [facility] Manager as needed  Patient Self Care Activities:  . Has an aide that comes in daily to help with some ADLs . Needs assistance with transportation  Please see past updates related to this goal by clicking on the "Past Updates" button in the selected goal       .  "I need to schedule a Covid vaccine" (pt-stated)        Current Barriers:  . Care Coordination needs related to Covid Vaccination in a patient with COPD and HTN . Cognitive Deficits . Unable to independently drive  Nurse Case Manager Clinical Goal(s):  [name] Over the next 30 days, patient will receive Covid Vaccine  Interventions:  . 1:1 collaboration with [name], MD regarding development and update of comprehensive plan of care as evidenced by provider attestation and co-signature . Inter-disciplinary care team collaboration (see longitudinal plan of care) . Chart reviewed . Talked with patient about desire to schedule 1st Covid vaccine . Collaborated with [facility] clinical  staff o They can administer the vaccine at his appointment with Dr Livia Snellen on 05/15/20. They are only administered on Wednesdays.  . Advised patient that he will receive vaccine at his appointment on 05/15/20. . Questions answered . Collaborated with [name] to arrange transportation to appt . Encouraged patient to reach out to [facility] as needed         .  Recurrent Falls & Fear of Falls (pt-stated)        CARE PLAN ENTRY (see longitudinal plan of care for additional care plan information)  Current Barriers:  . Care Coordination needs related to new onset of recurrent falls in a patient with infantile cerebral palsy, HTN, and COPD (disease states) . Cognitive Deficits  Nurse Case Manager Clinical Goal(s):  [name] Over the next 30 days, patient will reach out to [facility] or PCP with any new or worsening symptoms . Over the next 30 days, patient will demonstrate understanding of fall prevention strategies as evidenced by a decrease in falls .  Over the next 30 days, patient will work with home health agency and PCP to arrange home health PT for strength and gait training  Interventions:  . Inter-disciplinary care team collaboration (see longitudinal plan of care) . Chart reviewed including recent office notes and lab results . Medications reviewed and discussed. No recent medication changes.  . Discussed falls . Discussed weakness and potential causes for falls . Encouraged patient to use cane for all ambulation . Encouraged patient to change positions slowly and to stand for 30 seconds or so after arising from a seated position before he starts to walk . Discussed that falls are affecting his ability to perform ADLs and IADLs . Collaborated with [facility] clinical staff to schedule visit with PCP for 05/15/20 for evaluation to order [facility] physical therapy and to assess for medical cause for weakness and falls . Asked patient to have his aid check and record his blood pressure daily and PRN and to  bring that log with him to his appointment . Encouraged patient to talk with [name] regarding psychosocial issues . Encouraged patient to reach out to PCP or RNCM with any new symptoms and to call if he experiences any additional falls . Encouraged patient to keep cell phone with him at all times  Patient Self Care Activities:  . Has an aide that comes in daily to help with some ADLs . Does not drive and has assistance from sister and [name] . Takes prepackaged medications as directed . Calls PCP with any medical concerns  Please see past updates related to this goal by clicking on the "Past Updates" button in the selected goal           Plan:  The patient has been provided with contact information for the care management team and has been advised to call with any health related questions or concerns.  The care management team will reach out to the patient again over the next 30 days.  Next PCP appointment scheduled for: 05/15/20 with Dr Livia Snellen  [name], BSN, RN-BC [place] [facility] [place] Management Direct Dial: [phone]

## 2020-04-24 NOTE — Chronic Care Management (AMB) (Signed)
Chronic Care Management    Clinical Social Work Follow Up Note  04/24/2020 Name: [name] MRN: [id number] DOB: [birth year]  [name] is a 65 y.o. year old male who is a primary care patient of [name], Broadus John, MD. The CCM team was consulted for assistance with [employer] .   Review of patient status, including review of consultants reports, other relevant assessments, and collaboration with appropriate care team members and the patient's provider was performed as part of comprehensive patient evaluation and provision of chronic care management services.    SDOH (Social Determinants of Health) assessments performed: No; risk for tobacco use; risk for depression; risk for transport needs; risk for physical inactivity  Flowsheet Row Chronic Care Management from 02/21/2019 in [facility]  PHQ-9 Total Score 9      GAD 7 : Generalized Anxiety Score 04/10/2019 05/01/2016 03/31/2016  Nervous, Anxious, on Edge 0 1 3  Control/stop worrying 0 1 3  Worry too much - different things 0 1 3  Trouble relaxing 0 0 2  Restless 0 0 2  Easily annoyed or irritable 0 0 0  Afraid - awful might happen 0 0 0  Total GAD 7 Score 0 3 13  Anxiety Difficulty Not difficult at all Not difficult at all Not difficult at all    Outpatient Encounter Medications as of 04/24/2020  Medication Sig  . acetaminophen (TYLENOL) 325 MG tablet Take 2 tablets (650 mg total) by mouth every 6 (six) hours as needed for mild pain or headache.  . albuterol (PROAIR HFA) 108 (90 Base) MCG/ACT inhaler 1-2 puffs every 6 hours as needed wheezing or shortness of breath.  [name] aspirin EC 81 MG tablet Take 1 tablet (81 mg total) by mouth daily.  [name] atorvastatin (LIPITOR) 40 MG tablet TAKE 1 TABLET ONCE DAILY AT 6PM  . buPROPion (WELLBUTRIN SR) 150 MG 12 hr tablet Take 1 tablet (150 mg total) by mouth daily with breakfast.  . butalbital-acetaminophen-caffeine (ESGIC) 50-325-40 MG tablet Take 1 tablet by  mouth every 6 (six) hours as needed for headache.  . celecoxib (CELEBREX) 200 MG capsule Take 1 capsule (200 mg total) by mouth daily as needed.  . cetaphil (CETAPHIL) lotion Apply 1 application topically 2 (two) times daily. For rash. Apply after washing with [facility] shower gel and rinsing with clear warm water.  . DULoxetine (CYMBALTA) 60 MG capsule Take 1 capsule (60 mg total) by mouth daily. WITH A FULL STOMACH AT SUPPER TIME  . fexofenadine (ALLEGRA) 180 MG tablet Take 1 tablet (180 mg total) by mouth daily. For allergy symptoms  . fluocinonide-emollient (LIDEX-E) 0.05 % cream Apply 1 application topically 2 (two) times daily. To affected areas  . fluticasone (FLONASE) 50 MCG/ACT nasal spray Place 2 sprays into both nostrils daily as needed for allergies or rhinitis.  . fluticasone furoate-vilanterol (BREO ELLIPTA) 100-25 MCG/INH AEPB Inhale 1 puff into the lungs daily.  . Ipratropium-Albuterol (COMBIVENT RESPIMAT) 20-100 MCG/ACT AERS respimat Inhale 1 puff into the lungs every 6 (six) hours  . isosorbide mononitrate (IMDUR) 60 MG 24 hr tablet Take 1 tablet (60 mg total) by mouth daily.  . metoprolol tartrate (LOPRESSOR) 25 MG tablet Take 1 tablet (25 mg total) by mouth 2 (two) times daily.  . mirtazapine (REMERON) 15 MG tablet Take 1 tablet (15 mg total) by mouth at bedtime. For sleep  . nitroGLYCERIN (NITROSTAT) 0.4 MG SL tablet Place 1 tablet (0.4 mg total) under the tongue every 5 (five)  minutes x 3 doses as needed for chest pain.  . pantoprazole (PROTONIX) 40 MG tablet TAKE  (1)  TABLET TWICE A DAY.  . tamsulosin (FLOMAX) 0.4 MG CAPS capsule Take 2 capsules (0.8 mg total) by mouth daily after supper.   Facility-Administered Encounter Medications as of 04/24/2020  Medication  . cyanocobalamin ((VITAMIN B-12)) injection 1,000 mcg   [name] called client today and spoke via phone with client today about client needs. Client has been asking about COVID 19 vaccine.  [name] collaborated with  [facility] about client questions about COVID 19 vaccine. Client also has been calling RNCM and [name] multiple times about Lift Chair. Dr. Darlyn Read has sent script for Lift Chair to [facility]. Client and his home health aide traveled to [facility] recently to talk with representative about Retail buyer.  Representative of [employer] told client that he would have to pay between $ 900.00 to $ 2,000.00 for his part of the cost of such a Retail buyer. Client said he could not afford to pay these costs. RNCM and [name] collaborated today again about Retail buyer issue for client. Client is scheduled for [name] to provide transport for him tomorrow to come [facility] his monthly B12 injection. [name] informed client of these transport arrangements.  Follow Up Plan: [name] to call client in next 4 weeks to talk with client about social work needs of client at that time  [name].Forrest MSW, [name] Licensed Clinical Social Worker [facility] Care Management [phone]

## 2020-04-25 ENCOUNTER — Other Ambulatory Visit: Payer: Self-pay

## 2020-04-25 ENCOUNTER — Ambulatory Visit: Payer: Medicare Other

## 2020-04-25 ENCOUNTER — Ambulatory Visit (INDEPENDENT_AMBULATORY_CARE_PROVIDER_SITE_OTHER): Payer: Medicare Other

## 2020-04-25 DIAGNOSIS — E538 Deficiency of other specified B group vitamins: Secondary | ICD-10-CM

## 2020-04-25 NOTE — Progress Notes (Signed)
B12 injection given and tolerated well.  

## 2020-04-29 ENCOUNTER — Telehealth: Payer: Self-pay

## 2020-04-29 NOTE — Telephone Encounter (Signed)
    MA1/01/2021 1st Attempt  Name: [name]   MRN: [id number]   DOB: 11/29/1955   AGE: 65 y.o.   GENDER: male   PCP [name], MD.   Referral Reason: Resources for lift chair  Interventions: Investigation of community resources performed Discussed resources to assist with obtaining a lift chair for patient. Spoke with sister Meriam Sprague..  Follow up plan: Care guide will follow up with patient by phone over the next 5 days  Maryhelen Lindler, AAS Paralegal, [place] . Embedded Care Coordination [facility]  Care Management  [address],  [address] [email or web address]  [phone]   [email or web address]

## 2020-05-03 ENCOUNTER — Ambulatory Visit: Payer: Self-pay | Admitting: Licensed Clinical Social Worker

## 2020-05-03 ENCOUNTER — Telehealth: Payer: Self-pay

## 2020-05-03 DIAGNOSIS — J441 Chronic obstructive pulmonary disease with (acute) exacerbation: Secondary | ICD-10-CM

## 2020-05-03 DIAGNOSIS — M5136 Other intervertebral disc degeneration, lumbar region: Secondary | ICD-10-CM

## 2020-05-03 DIAGNOSIS — F3341 Major depressive disorder, recurrent, in partial remission: Secondary | ICD-10-CM

## 2020-05-03 DIAGNOSIS — E538 Deficiency of other specified B group vitamins: Secondary | ICD-10-CM

## 2020-05-03 DIAGNOSIS — G809 Cerebral palsy, unspecified: Secondary | ICD-10-CM

## 2020-05-03 DIAGNOSIS — K219 Gastro-esophageal reflux disease without esophagitis: Secondary | ICD-10-CM

## 2020-05-03 DIAGNOSIS — I252 Old myocardial infarction: Secondary | ICD-10-CM

## 2020-05-03 DIAGNOSIS — I10 Essential (primary) hypertension: Secondary | ICD-10-CM

## 2020-05-03 DIAGNOSIS — M51369 Other intervertebral disc degeneration, lumbar region without mention of lumbar back pain or lower extremity pain: Secondary | ICD-10-CM

## 2020-05-03 DIAGNOSIS — R482 Apraxia: Secondary | ICD-10-CM

## 2020-05-03 DIAGNOSIS — F411 Generalized anxiety disorder: Secondary | ICD-10-CM

## 2020-05-03 DIAGNOSIS — I25118 Atherosclerotic heart disease of native coronary artery with other forms of angina pectoris: Secondary | ICD-10-CM

## 2020-05-03 NOTE — Patient Instructions (Addendum)
Licensed Clinical Social Worker Visit Information  Materials Provided:   05/03/2020  Name: [name]          MRN: [id number]       DOB: 1955/10/16  [name] is a 65 y.o. year old male who is a primary care patient of [name], [name], MD. The CCM team was consulted for assistance with [name] .   Review of patient status, including review of consultants reports, other relevant assessments, and collaboration with appropriate care team members and the patient's provider was performed as part of comprehensive patient evaluation and provision of chronic care management services.    SDOH (Social Determinants of Health) assessments performed: No; risk for tobacco use; risk for depression; risk for food needs; risk of financial strain; risk of stress; risk for transport needs  [name] called patient to talk with patient about his current needs. [name] also informed patient that [name] had arranged for [name] to transport client to and from his next medical appointment at [facility] for visit with Dr. Livia Snellen and for COVID 19 vaccine. Client understood this information. Client spoke of some difficulty in walking.  [name] and client have talked about his home health aide and the support he receives from home health aide. He receives some support from his sister. He is still hoping to be able to receive a Lift chair if possible but said he was not able to pay the Copay amount required by his insurance to obtain such a chair.  Follow Up Plan: [name] to call client in next 4 weeks to talk with him about social work needs of client at that time   The patient verbalized understanding of instructions provided today and declined a print copy of patient instruction materials.   [name].Dawnette Mione MSW, [name] Licensed Clinical Social Worker [facility] Care Management [phone]

## 2020-05-03 NOTE — Telephone Encounter (Signed)
    Telephone encounter was:  Successful.  05/03/20 Name: [name] MRN: [id number] DOB: 1955/07/18  [name] is a 65 year old male who is a primary care patient of [name], [name], MD . The community resource team was consulted for assistance with Assistance with lift chair  Care guide performed the following interventions: Follow up call placed to community resources to determine status of patients referral Follow up call placed to the patient to discuss status of referral Checked with [employer] they are unable to assist with the cost of a lift chair. Patient has already called Medicaid they will only cover the lift mechanism not the entire chair.  Suggested the patient continue to check places like [employer] and [place]..  Follow Up Plan:  No further follow up planned at this time. The patient has been provided with needed resources. and spoke with Drenda Freeze, RN she is planning on following up with the patient.     Emin Foree, AAS Paralegal, [place] . Embedded Care Coordination [facility]  Care Management  [address], [place] [address] [email or web address]  [phone]   [email or web address]

## 2020-05-03 NOTE — Chronic Care Management (AMB) (Signed)
Chronic Care Management    Clinical Social Work Follow Up Note  05/03/2020 Name: [name] MRN: [id number] DOB: 12-09-1955  [name] is a 65 y.o. year old male who is a primary care patient of [name], [name], MD. The CCM team was consulted for assistance with [name] .   Review of patient status, including review of consultants reports, other relevant assessments, and collaboration with appropriate care team members and the patient's provider was performed as part of comprehensive patient evaluation and provision of chronic care management services.    SDOH (Social Determinants of Health) assessments performed: No; risk for tobacco use; risk for depression; risk for food needs; risk of financial strain; risk of stress; risk for transport needs  Flowsheet Row Chronic Care Management from 02/21/2019 in [place]  PHQ-9 Total Score 9       GAD 7 : Generalized Anxiety Score 04/10/2019 05/01/2016 03/31/2016  Nervous, Anxious, on Edge 0 1 3  Control/stop worrying 0 1 3  Worry too much - different things 0 1 3  Trouble relaxing 0 0 2  Restless 0 0 2  Easily annoyed or irritable 0 0 0  Afraid - awful might happen 0 0 0  Total GAD 7 Score 0 3 13  Anxiety Difficulty Not difficult at all Not difficult at all Not difficult at all     Outpatient Encounter Medications as of 05/03/2020  Medication Sig  . acetaminophen (TYLENOL) 325 MG tablet Take 2 tablets (650 mg total) by mouth every 6 (six) hours as needed for mild pain or headache.  . albuterol (PROAIR HFA) 108 (90 Base) MCG/ACT inhaler 1-2 puffs every 6 hours as needed wheezing or shortness of breath.  [name] aspirin EC 81 MG tablet Take 1 tablet (81 mg total) by mouth daily.  [name] atorvastatin (LIPITOR) 40 MG tablet TAKE 1 TABLET ONCE DAILY AT 6PM  . buPROPion (WELLBUTRIN SR) 150 MG 12 hr tablet Take 1 tablet (150 mg total) by mouth daily with breakfast.  . butalbital-acetaminophen-caffeine (ESGIC)  50-325-40 MG tablet Take 1 tablet by mouth every 6 (six) hours as needed for headache.  . celecoxib (CELEBREX) 200 MG capsule Take 1 capsule (200 mg total) by mouth daily as needed.  . cetaphil (CETAPHIL) lotion Apply 1 application topically 2 (two) times daily. For rash. Apply after washing with [facility] shower gel and rinsing with clear warm water.  . DULoxetine (CYMBALTA) 60 MG capsule Take 1 capsule (60 mg total) by mouth daily. WITH A FULL STOMACH AT SUPPER TIME  . fexofenadine (ALLEGRA) 180 MG tablet Take 1 tablet (180 mg total) by mouth daily. For allergy symptoms  . fluocinonide-emollient (LIDEX-E) 0.05 % cream Apply 1 application topically 2 (two) times daily. To affected areas  . fluticasone (FLONASE) 50 MCG/ACT nasal spray Place 2 sprays into both nostrils daily as needed for allergies or rhinitis.  . fluticasone furoate-vilanterol (BREO ELLIPTA) 100-25 MCG/INH AEPB Inhale 1 puff into the lungs daily.  . Ipratropium-Albuterol (COMBIVENT RESPIMAT) 20-100 MCG/ACT AERS respimat Inhale 1 puff into the lungs every 6 (six) hours  . isosorbide mononitrate (IMDUR) 60 MG 24 hr tablet Take 1 tablet (60 mg total) by mouth daily.  . metoprolol tartrate (LOPRESSOR) 25 MG tablet Take 1 tablet (25 mg total) by mouth 2 (two) times daily.  . mirtazapine (REMERON) 15 MG tablet Take 1 tablet (15 mg total) by mouth at bedtime. For sleep  . nitroGLYCERIN (NITROSTAT) 0.4 MG SL tablet Place 1 tablet (  0.4 mg total) under the tongue every 5 (five) minutes x 3 doses as needed for chest pain.  . pantoprazole (PROTONIX) 40 MG tablet TAKE  (1)  TABLET TWICE A DAY.  . tamsulosin (FLOMAX) 0.4 MG CAPS capsule Take 2 capsules (0.8 mg total) by mouth daily after supper.   Facility-Administered Encounter Medications as of 05/03/2020  Medication  . cyanocobalamin ((VITAMIN B-12)) injection 1,000 mcg     [name] called patient to talk with patient about his current needs. [name] also informed patient that [name] had arranged  for [name] to transport client to and from his next medical appointment at [facility] for visit with Dr. Livia Snellen and for COVID 19 vaccine. Client understood this information. Client spoke of some difficulty in walking.  [name] and client have talked about his home health aide and the support he receives from home health aide. He receives some support from his sister. He is still hoping to be able to receive a Lift chair if possible but said he was not able to pay the Copay amount required by his insurance to obtain such a chair.  Follow Up Plan: [name] to call client in next 4 weeks to talk with him about social work needs of client at that time   Darlene Bartelt.Malina Geers MSW, [name] Licensed Clinical Social Worker [facility] Care Management [phone]

## 2020-05-07 ENCOUNTER — Telehealth: Payer: Self-pay | Admitting: *Deleted

## 2020-05-07 ENCOUNTER — Encounter: Payer: Self-pay | Admitting: Family Medicine

## 2020-05-07 ENCOUNTER — Ambulatory Visit: Payer: Self-pay | Admitting: Licensed Clinical Social Worker

## 2020-05-07 ENCOUNTER — Encounter: Payer: Self-pay | Admitting: *Deleted

## 2020-05-07 ENCOUNTER — Ambulatory Visit (INDEPENDENT_AMBULATORY_CARE_PROVIDER_SITE_OTHER): Payer: Medicare Other | Admitting: Family Medicine

## 2020-05-07 ENCOUNTER — Telehealth: Payer: Self-pay | Admitting: Family Medicine

## 2020-05-07 DIAGNOSIS — G809 Cerebral palsy, unspecified: Secondary | ICD-10-CM

## 2020-05-07 DIAGNOSIS — R109 Unspecified abdominal pain: Secondary | ICD-10-CM

## 2020-05-07 DIAGNOSIS — J441 Chronic obstructive pulmonary disease with (acute) exacerbation: Secondary | ICD-10-CM

## 2020-05-07 DIAGNOSIS — E538 Deficiency of other specified B group vitamins: Secondary | ICD-10-CM

## 2020-05-07 DIAGNOSIS — I10 Essential (primary) hypertension: Secondary | ICD-10-CM

## 2020-05-07 DIAGNOSIS — R482 Apraxia: Secondary | ICD-10-CM

## 2020-05-07 DIAGNOSIS — F411 Generalized anxiety disorder: Secondary | ICD-10-CM

## 2020-05-07 DIAGNOSIS — F3341 Major depressive disorder, recurrent, in partial remission: Secondary | ICD-10-CM

## 2020-05-07 DIAGNOSIS — K219 Gastro-esophageal reflux disease without esophagitis: Secondary | ICD-10-CM

## 2020-05-07 DIAGNOSIS — I252 Old myocardial infarction: Secondary | ICD-10-CM

## 2020-05-07 DIAGNOSIS — M5136 Other intervertebral disc degeneration, lumbar region: Secondary | ICD-10-CM

## 2020-05-07 DIAGNOSIS — I25118 Atherosclerotic heart disease of native coronary artery with other forms of angina pectoris: Secondary | ICD-10-CM

## 2020-05-07 NOTE — Patient Instructions (Addendum)
Licensed Clinical Social Worker Visit Information  Materials Provided: No  05/07/2020  Name: [name]          MRN: [id number]       DOB: [birth year]  [name] is a 65 y.o. year old male who is a primary care patient of [name], [name], MD. The CCM team was consulted for assistance with [name] .   Review of patient status, including review of consultants reports, other relevant assessments, and collaboration with appropriate care team members and the patient's provider was performed as part of comprehensive patient evaluation and provision of chronic care management services.    SDOH (Social Determinants of Health) assessments performed: No;  Risk for depression; risk for tobacco use; risk for transport needs; risk for physical inactivity; risk for social isolation   [name] spoke via phone with client to talk about needs of client. Client said he had a pain in his right side.  He said he did not know if it was a muscle strain or not. He said he did have a kidney stone 2 years ago.  [name] informed client that [name] would call RNCM [name] to inform her of this information . Client has a face to face appointment set up with Dr. Livia Snellen at [facility] on 05/15/2020. Client understood plan and agreed to plan .  Follow Up Plan: [name] to call client as needed in next 4 weeks to discuss social work needs of client  The patient verbalized understanding of instructions provided today and declined a print copy of patient instruction materials.   [name].Jomo Forand MSW, [name] Licensed Clinical Social Worker [facility] Care Management [phone]

## 2020-05-07 NOTE — Telephone Encounter (Signed)
05/06/20  Patient contacted [name], [name] yesterday evening regarding right sided abd pain. Stated that it felt like a previous kidney stone. This information was left on my voicemail and I have not talked with patient directly.   Patient has an appt with Dr Livia Snellen on 05/15/20.  Forwarding to [facility] Clinical Staff for patient outreach and recommendations for acute right side.   [name], BSN, RN-BC Embedded Chronic Care Manager [facility] / [place] Management Direct Dial: [phone]

## 2020-05-07 NOTE — Telephone Encounter (Signed)
   [name] DOB: Jul 10, 1955 MRN: [id number]   RIDER WAIVER AND RELEASE OF LIABILITY  For purposes of improving physical access to our facilities, [place] is pleased to partner with third parties to provide [place] patients or other authorized individuals the option of convenient, on-demand ground transportation services (the [place]") through use of the technology service that enables users to request on-demand ground transportation from independent third-party providers.  By opting to use and accept these [employer], I, the undersigned, hereby agree on behalf of myself, and on behalf of any minor child using the [employer] for whom I am the parent or legal guardian, as follows:  1. Government social research officer provided to me are provided by independent third-party transportation providers who are not [employer] or employees and who are unaffiliated with [employer]. 2. [place] is neither a transportation carrier nor a common or public carrier. 3. [place] has no control over the quality or safety of the transportation that occurs as a result of the [employer]. 4. [facility] cannot guarantee that any third-party transportation provider will complete any arranged transportation service. 5. [place] makes no representation, warranty, or guarantee regarding the reliability, timeliness, quality, safety, suitability, or availability of any of the Transport Services or that they will be error free. 6. I fully understand that traveling by vehicle involves risks and dangers of serious bodily injury, including permanent disability, paralysis, and death. I agree, on behalf of myself and on behalf of any minor child using the Transport Services for whom I am the parent or legal guardian, that the entire risk arising out of my use of the [employer] remains solely with me, to the maximum extent permitted under applicable law. 7. The [employer] are provided "as is" and "as available." [place] disclaims all representations and warranties, express, implied or statutory, not expressly set out in these terms, including the implied warranties of merchantability and fitness for a particular purpose. 8. I hereby waive and release [place], its agents, employees, officers, directors, representatives, insurers, attorneys, assigns, successors, subsidiaries, and affiliates from any and all past, present, or future claims, demands, liabilities, actions, causes of action, or suits of any kind directly or indirectly arising from acceptance and use of the [employer]. 9. I further waive and release [place] and its affiliates from all present and future liability and responsibility for any injury or death to persons or damages to property caused by or related to the use of the [employer]. 10. I have read this Waiver and Release of Liability, and I understand the terms used in it and their legal significance. This Waiver is freely and voluntarily given with the understanding that my right (as well as the right of any minor child for whom I am the parent or legal guardian using the [employer]) to legal recourse against [facility] in connection with the [employer] is knowingly surrendered in return for use of these services.   I attest that I read the consent document to [name], gave [name] the opportunity to ask questions and answered the questions asked (if any). I affirm that [place] then provided consent for he's participation in this program.     [name]

## 2020-05-07 NOTE — Telephone Encounter (Signed)
Spoke to patient - he is having right side abd pains - no changes in Bms, no nausea/vomiting, no fevers (patient states temp was sligtly elevated earlier).  Patient states that due to road condition he is unable to get out and requested a televisit - appt made

## 2020-05-07 NOTE — Chronic Care Management (AMB) (Signed)
Chronic Care Management    Clinical Social Work Follow Up Note  05/07/2020 Name: [name] MRN: [id number] DOB: 05-08-1955  [name] is a 65 y.o. year old male who is a primary care patient of [name], [name], MD. The CCM team was consulted for assistance with [name] .   Review of patient status, including review of consultants reports, other relevant assessments, and collaboration with appropriate care team members and the patient's provider was performed as part of comprehensive patient evaluation and provision of chronic care management services.    SDOH (Social Determinants of Health) assessments performed: No;  Risk for depression; risk for tobacco use; risk for transport needs; risk for physical inactivity; risk for social isolation  Flowsheet Row Chronic Care Management from 02/21/2019 in [name]  PHQ-9 Total Score 9     GAD 7 : Generalized Anxiety Score 04/10/2019 05/01/2016 03/31/2016  Nervous, Anxious, on Edge 0 1 3  Control/stop worrying 0 1 3  Worry too much - different things 0 1 3  Trouble relaxing 0 0 2  Restless 0 0 2  Easily annoyed or irritable 0 0 0  Afraid - awful might happen 0 0 0  Total GAD 7 Score 0 3 13  Anxiety Difficulty Not difficult at all Not difficult at all Not difficult at all    Outpatient Encounter Medications as of 05/07/2020  Medication Sig  . acetaminophen (TYLENOL) 325 MG tablet Take 2 tablets (650 mg total) by mouth every 6 (six) hours as needed for mild pain or headache.  . albuterol (PROAIR HFA) 108 (90 Base) MCG/ACT inhaler 1-2 puffs every 6 hours as needed wheezing or shortness of breath.  [name] aspirin EC 81 MG tablet Take 1 tablet (81 mg total) by mouth daily.  [name] atorvastatin (LIPITOR) 40 MG tablet TAKE 1 TABLET ONCE DAILY AT 6PM  . buPROPion (WELLBUTRIN SR) 150 MG 12 hr tablet Take 1 tablet (150 mg total) by mouth daily with breakfast.  . butalbital-acetaminophen-caffeine (ESGIC) 50-325-40 MG  tablet Take 1 tablet by mouth every 6 (six) hours as needed for headache.  . celecoxib (CELEBREX) 200 MG capsule Take 1 capsule (200 mg total) by mouth daily as needed.  . cetaphil (CETAPHIL) lotion Apply 1 application topically 2 (two) times daily. For rash. Apply after washing with [facility] shower gel and rinsing with clear warm water.  . DULoxetine (CYMBALTA) 60 MG capsule Take 1 capsule (60 mg total) by mouth daily. WITH A FULL STOMACH AT SUPPER TIME  . fexofenadine (ALLEGRA) 180 MG tablet Take 1 tablet (180 mg total) by mouth daily. For allergy symptoms  . fluocinonide-emollient (LIDEX-E) 0.05 % cream Apply 1 application topically 2 (two) times daily. To affected areas  . fluticasone (FLONASE) 50 MCG/ACT nasal spray Place 2 sprays into both nostrils daily as needed for allergies or rhinitis.  . fluticasone furoate-vilanterol (BREO ELLIPTA) 100-25 MCG/INH AEPB Inhale 1 puff into the lungs daily.  . Ipratropium-Albuterol (COMBIVENT RESPIMAT) 20-100 MCG/ACT AERS respimat Inhale 1 puff into the lungs every 6 (six) hours  . isosorbide mononitrate (IMDUR) 60 MG 24 hr tablet Take 1 tablet (60 mg total) by mouth daily.  . metoprolol tartrate (LOPRESSOR) 25 MG tablet Take 1 tablet (25 mg total) by mouth 2 (two) times daily.  . mirtazapine (REMERON) 15 MG tablet Take 1 tablet (15 mg total) by mouth at bedtime. For sleep  . nitroGLYCERIN (NITROSTAT) 0.4 MG SL tablet Place 1 tablet (0.4 mg total) under the  tongue every 5 (five) minutes x 3 doses as needed for chest pain.  . pantoprazole (PROTONIX) 40 MG tablet TAKE  (1)  TABLET TWICE A DAY.  . tamsulosin (FLOMAX) 0.4 MG CAPS capsule Take 2 capsules (0.8 mg total) by mouth daily after supper.   Facility-Administered Encounter Medications as of 05/07/2020  Medication  . cyanocobalamin ((VITAMIN B-12)) injection 1,000 mcg     [name] spoke via phone with client to talk about needs of client. Client said he had a pain in his right side.  He said he did not  know if it was a muscle strain or not. He said he did have a kidney stone 2 years ago.  [name] informed client that [name] would call RNCM [name] to inform her of this information . Client has a face to face appointment set up with Dr. Livia Snellen at [facility] on 05/15/2020. Client understood plan and agreed to plan .  Follow Up Plan: [name] to call client as needed in next 4 weeks to discuss social work needs of client  [name].[name] MSW, [name] Licensed Clinical Social Worker [facility] Care Management [phone]

## 2020-05-07 NOTE — Progress Notes (Signed)
Subjective:    Patient ID: [name], male    DOB: 24-Oct-1955, 65 y.o.   MRN: [id number]   HPI: [name] is a 65 y.o. male presenting for pain in his side. LAsts until about 1 pm daily. No pain now. It is so severe he can't sit still. No relief with change of position. He has been drinking a lot of water hoping it would. Pt. Denies chills. Having a little, small fever. Using a heating pad currently with no relief. Sister said it was a pulled muscle, so he delayed. No nausea or vomiting. Can't get out due to icy conditions.    Depression screen [facility] 2/9 03/28/2020 02/06/2020 08/23/2019 08/16/2019 06/27/2019  Decreased Interest 0 0 0 0 0  Down, Depressed, Hopeless 0 0 0 0 0  PHQ - 2 Score 0 0 0 0 0  Altered sleeping - - - - -  Tired, decreased energy - - - - -  Change in appetite - - - - -  Feeling bad or failure about yourself  - - - - -  Trouble concentrating - - - - -  Moving slowly or fidgety/restless - - - - -  Suicidal thoughts - - - - -  PHQ-9 Score - - - - -  Difficult doing work/chores - - - - -  Some recent data might be hidden     Relevant past medical, surgical, family and social history reviewed and updated as indicated.  Interim medical history since our last visit reviewed. Allergies and medications reviewed and updated.  ROS:  Review of Systems  Constitutional: Negative for fever.  Respiratory: Negative for shortness of breath.   Cardiovascular: Negative for chest pain.  Gastrointestinal: Positive for abdominal distention and abdominal pain. Negative for blood in stool, constipation, diarrhea, nausea and vomiting.  Genitourinary: Negative for difficulty urinating and hematuria.  Musculoskeletal: Negative for arthralgias.  Skin: Negative for rash.     Social History   Tobacco Use  Smoking Status Former Smoker  . Packs/day: 0.50  . Years: 41.00  . Pack years: 20.50  . Types: Cigarettes  . Quit date: 06/06/2018  . Years since quitting: 1.9   Smokeless Tobacco Never Used  Tobacco Comment   Patient quit on his own in February 2020       Objective:     Wt Readings from Last 3 Encounters:  03/28/20 256 lb 9.6 oz (116.4 kg)  02/06/20 255 lb 3.2 oz (115.8 kg)  12/20/19 248 lb 2 oz (112.5 kg)     Exam deferred. Pt. Harboring due to COVID 19. Phone visit performed.   Assessment & Plan:   1. Acute right flank pain     No orders of the defined types were placed in this encounter.   No orders of the defined types were placed in this encounter.     Diagnoses and all orders for this visit:  Acute right flank pain    Virtual Visit via telephone Note  I discussed the limitations, risks, security and privacy concerns of performing an evaluation and management service by telephone and the availability of in person appointments. The patient was identified with two identifiers. Pt.expressed understanding and agreed to proceed. Pt. Is at home. Dr. Livia Snellen is in his office.  Follow Up Instructions:   I discussed the assessment and treatment plan with the patient. The patient was provided an opportunity to ask questions and all were answered. The patient agreed with the plan and  demonstrated an understanding of the instructions.   The patient was advised to call back or seek an in-person evaluation if the symptoms worsen or if the condition fails to improve as anticipated.   Total minutes including chart review and phone contact time: 22   Follow up plan: Return in about 2 days (around 05/09/2020), or with me. Strongly recommended he call 911 now. Pt. declined multiple times.  [name], MD [place]

## 2020-05-09 ENCOUNTER — Ambulatory Visit (INDEPENDENT_AMBULATORY_CARE_PROVIDER_SITE_OTHER): Payer: Medicare Other | Admitting: Family Medicine

## 2020-05-09 ENCOUNTER — Other Ambulatory Visit: Payer: Self-pay

## 2020-05-09 ENCOUNTER — Encounter: Payer: Self-pay | Admitting: Family Medicine

## 2020-05-09 ENCOUNTER — Ambulatory Visit (INDEPENDENT_AMBULATORY_CARE_PROVIDER_SITE_OTHER): Payer: Medicare Other

## 2020-05-09 ENCOUNTER — Ambulatory Visit: Payer: Self-pay | Admitting: Licensed Clinical Social Worker

## 2020-05-09 VITALS — BP 134/79 | HR 82 | Temp 97.8°F | Resp 20 | Ht 73.0 in | Wt 254.0 lb

## 2020-05-09 DIAGNOSIS — R482 Apraxia: Secondary | ICD-10-CM

## 2020-05-09 DIAGNOSIS — K219 Gastro-esophageal reflux disease without esophagitis: Secondary | ICD-10-CM

## 2020-05-09 DIAGNOSIS — I10 Essential (primary) hypertension: Secondary | ICD-10-CM

## 2020-05-09 DIAGNOSIS — I25118 Atherosclerotic heart disease of native coronary artery with other forms of angina pectoris: Secondary | ICD-10-CM

## 2020-05-09 DIAGNOSIS — G809 Cerebral palsy, unspecified: Secondary | ICD-10-CM

## 2020-05-09 DIAGNOSIS — R109 Unspecified abdominal pain: Secondary | ICD-10-CM | POA: Diagnosis not present

## 2020-05-09 DIAGNOSIS — J441 Chronic obstructive pulmonary disease with (acute) exacerbation: Secondary | ICD-10-CM

## 2020-05-09 DIAGNOSIS — I252 Old myocardial infarction: Secondary | ICD-10-CM

## 2020-05-09 DIAGNOSIS — E538 Deficiency of other specified B group vitamins: Secondary | ICD-10-CM

## 2020-05-09 DIAGNOSIS — M5136 Other intervertebral disc degeneration, lumbar region: Secondary | ICD-10-CM

## 2020-05-09 DIAGNOSIS — F3341 Major depressive disorder, recurrent, in partial remission: Secondary | ICD-10-CM

## 2020-05-09 DIAGNOSIS — F411 Generalized anxiety disorder: Secondary | ICD-10-CM

## 2020-05-09 LAB — MICROSCOPIC EXAMINATION: Bacteria, UA: NONE SEEN

## 2020-05-09 LAB — URINALYSIS, COMPLETE
Bilirubin, UA: NEGATIVE
Glucose, UA: NEGATIVE
Leukocytes,UA: NEGATIVE
Nitrite, UA: NEGATIVE
Specific Gravity, UA: >1.03 — ABNORMAL HIGH (ref 1.005–1.030)
Urobilinogen, Ur: 2 mg/dL — ABNORMAL HIGH (ref 0.2–1.0)
pH, UA: 5.5 (ref 5.0–7.5)

## 2020-05-09 NOTE — Progress Notes (Signed)
Subjective:  Patient ID: [name], male    DOB: 1955/10/16  Age: 65 y.o. MRN: [id number]  CC: Continued right flank pain   HPI [name] presents for flank pain on the right. Not associated with dyspnea. It starts every morning when he awakens and ends around 1 PM daily.. It does not radiate. There are no GI symptoms there is no known injury.  Depression screen [facility] 2/9 05/09/2020 03/28/2020 02/06/2020  Decreased Interest 0 0 0  Down, Depressed, Hopeless 0 0 0  PHQ - 2 Score 0 0 0  Altered sleeping - - -  Tired, decreased energy - - -  Change in appetite - - -  Feeling bad or failure about yourself  - - -  Trouble concentrating - - -  Moving slowly or fidgety/restless - - -  Suicidal thoughts - - -  PHQ-9 Score - - -  Difficult doing work/chores - - -  Some recent data might be hidden    History [name] has a past medical history of Cerebral palsy ([place]), Coronary artery disease, Depression, GERD (gastroesophageal reflux disease), Hypertension, Scoliosis, and Seizures ([place]).   He has a past surgical history that includes Coronary angioplasty with stent; Carpal tunnel release (Right, 08/13/2016); and LEFT HEART CATH AND CORONARY ANGIOGRAPHY (N/A, 07/10/2017).   His family history includes Alcohol abuse in his brother; CAD in his brother and sister; CAD (age of onset: 57) in his father; Diabetes in his mother; Heart disease in his father; Heart failure in his sister.He reports that he quit smoking about 23 months ago. His smoking use included cigarettes. He has a 20.50 pack-year smoking history. He has never used smokeless tobacco. He reports current alcohol use. He reports that he does not use drugs.    ROS Review of Systems  Constitutional: Negative for fever.  Respiratory: Negative for shortness of breath.   Cardiovascular: Negative for chest pain.  Genitourinary: Negative for difficulty urinating, dysuria and hematuria.  Musculoskeletal: Negative for arthralgias.   Skin: Negative for rash.    Objective:  BP 134/79   Pulse 82   Temp 97.8 F (36.6 C) (Temporal)   Resp 20   Ht '6\' 1"'  (1.854 m)   Wt 254 lb (115.2 kg)   SpO2 98%   BMI 33.51 kg/m   BP Readings from Last 3 Encounters:  05/09/20 134/79  03/28/20 128/73  02/06/20 123/71    Wt Readings from Last 3 Encounters:  05/09/20 254 lb (115.2 kg)  03/28/20 256 lb 9.6 oz (116.4 kg)  02/06/20 255 lb 3.2 oz (115.8 kg)     Physical Exam Vitals reviewed.  Constitutional:      Appearance: He is well-developed and well-nourished.  HENT:     Head: Normocephalic and atraumatic.     Right Ear: Tympanic membrane and external ear normal. No decreased hearing noted.     Left Ear: Tympanic membrane and external ear normal. No decreased hearing noted.     Mouth/Throat:     Pharynx: No oropharyngeal exudate or posterior oropharyngeal erythema.  Eyes:     Pupils: Pupils are equal, round, and reactive to light.  Cardiovascular:     Rate and Rhythm: Normal rate and regular rhythm.     Heart sounds: No murmur heard.   Pulmonary:     Effort: No respiratory distress.     Breath sounds: Normal breath sounds.  Abdominal:     General: Bowel sounds are normal.     Palpations: Abdomen is  soft. There is no mass.     Tenderness: There is abdominal tenderness (he is point tender over the tip of the 11th rib posteriorly on the right).  Musculoskeletal:     Cervical back: Normal range of motion and neck supple.       Assessment & Plan:   [name] was seen today for continued right flank pain.  Diagnoses and all orders for this visit:  Flank pain -     Urinalysis, Complete -     CBC with Differential/Platelet -     CMP14+EGFR -     DG Ribs Unilateral W/Chest Right; Future  Other orders -     Microscopic Examination   Rib abnormality noted on xr. Preliminary reading done by [name]    I am having [name] maintain his acetaminophen, cetaphil, aspirin EC,  butalbital-acetaminophen-caffeine, albuterol, fexofenadine, fluocinonide-emollient, fluticasone, Breo Ellipta, Combivent Respimat, isosorbide mononitrate, metoprolol tartrate, mirtazapine, pantoprazole, tamsulosin, nitroGLYCERIN, DULoxetine, celecoxib, buPROPion, and atorvastatin. We will continue to administer cyanocobalamin.  Allergies as of 05/09/2020      Reactions   Chantix [varenicline Tartrate] Nausea And Vomiting      Medication List       Accurate as of May 09, 2020 11:59 PM. If you have any questions, ask your nurse or doctor.        acetaminophen 325 MG tablet Commonly known as: TYLENOL Take 2 tablets (650 mg total) by mouth every 6 (six) hours as needed for mild pain or headache.   albuterol 108 (90 Base) MCG/ACT inhaler Commonly known as: ProAir HFA 1-2 puffs every 6 hours as needed wheezing or shortness of breath.   aspirin EC 81 MG tablet Take 1 tablet (81 mg total) by mouth daily.   atorvastatin 40 MG tablet Commonly known as: LIPITOR TAKE 1 TABLET ONCE DAILY AT 6PM   Breo Ellipta 100-25 MCG/INH Aepb Generic drug: fluticasone furoate-vilanterol Inhale 1 puff into the lungs daily.   buPROPion 150 MG 12 hr tablet Commonly known as: WELLBUTRIN SR Take 1 tablet (150 mg total) by mouth daily with breakfast.   butalbital-acetaminophen-caffeine 50-325-40 MG tablet Commonly known as: Esgic Take 1 tablet by mouth every 6 (six) hours as needed for headache.   celecoxib 200 MG capsule Commonly known as: CELEBREX Take 1 capsule (200 mg total) by mouth daily as needed.   cetaphil lotion Apply 1 application topically 2 (two) times daily. For rash. Apply after washing with [facility] shower gel and rinsing with clear warm water.   Combivent Respimat 20-100 MCG/ACT Aers respimat Generic drug: Ipratropium-Albuterol Inhale 1 puff into the lungs every 6 (six) hours   DULoxetine 60 MG capsule Commonly known as: CYMBALTA Take 1 capsule (60 mg total) by mouth  daily. WITH A FULL STOMACH AT SUPPER TIME   fexofenadine 180 MG tablet Commonly known as: ALLEGRA Take 1 tablet (180 mg total) by mouth daily. For allergy symptoms   fluocinonide-emollient 0.05 % cream Commonly known as: LIDEX-E Apply 1 application topically 2 (two) times daily. To affected areas   fluticasone 50 MCG/ACT nasal spray Commonly known as: FLONASE Place 2 sprays into both nostrils daily as needed for allergies or rhinitis.   isosorbide mononitrate 60 MG 24 hr tablet Commonly known as: IMDUR Take 1 tablet (60 mg total) by mouth daily.   metoprolol tartrate 25 MG tablet Commonly known as: LOPRESSOR Take 1 tablet (25 mg total) by mouth 2 (two) times daily.   mirtazapine 15 MG tablet Commonly known as: Remeron  Take 1 tablet (15 mg total) by mouth at bedtime. For sleep   nitroGLYCERIN 0.4 MG SL tablet Commonly known as: NITROSTAT Place 1 tablet (0.4 mg total) under the tongue every 5 (five) minutes x 3 doses as needed for chest pain.   pantoprazole 40 MG tablet Commonly known as: PROTONIX TAKE  (1)  TABLET TWICE A DAY.   tamsulosin 0.4 MG Caps capsule Commonly known as: FLOMAX Take 2 capsules (0.8 mg total) by mouth daily after supper.        Follow-up: Return in about 3 months (around 08/07/2020).  [name], M.D.

## 2020-05-09 NOTE — Chronic Care Management (AMB) (Signed)
Chronic Care Management    Clinical Social Work Follow Up Note  05/09/2020 Name: [name] MRN: [id number] DOB: [birth year]  [name] is a 65 y.o. year old male who is a primary care patient of [name], [name], MD. The CCM team was consulted for assistance with [name] .   Review of patient status, including review of consultants reports, other relevant assessments, and collaboration with appropriate care team members and the patient's provider was performed as part of comprehensive patient evaluation and provision of chronic care management services.    SDOH (Social Determinants of Health) assessments performed: No; risk for depression; risk for tobacco use; risk for transport needs; risk for social isolation  Flowsheet Row Chronic Care Management from 02/21/2019 in [facility]  PHQ-9 Total Score 9     GAD 7 : Generalized Anxiety Score 04/10/2019 05/01/2016 03/31/2016  Nervous, Anxious, on Edge 0 1 3  Control/stop worrying 0 1 3  Worry too much - different things 0 1 3  Trouble relaxing 0 0 2  Restless 0 0 2  Easily annoyed or irritable 0 0 0  Afraid - awful might happen 0 0 0  Total GAD 7 Score 0 3 13  Anxiety Difficulty Not difficult at all Not difficult at all Not difficult at all    Outpatient Encounter Medications as of 05/09/2020  Medication Sig  . acetaminophen (TYLENOL) 325 MG tablet Take 2 tablets (650 mg total) by mouth every 6 (six) hours as needed for mild pain or headache.  . albuterol (PROAIR HFA) 108 (90 Base) MCG/ACT inhaler 1-2 puffs every 6 hours as needed wheezing or shortness of breath.  [name] aspirin EC 81 MG tablet Take 1 tablet (81 mg total) by mouth daily.  [name] atorvastatin (LIPITOR) 40 MG tablet TAKE 1 TABLET ONCE DAILY AT 6PM  . buPROPion (WELLBUTRIN SR) 150 MG 12 hr tablet Take 1 tablet (150 mg total) by mouth daily with breakfast.  . butalbital-acetaminophen-caffeine (ESGIC) 50-325-40 MG tablet Take 1 tablet by mouth  every 6 (six) hours as needed for headache.  . celecoxib (CELEBREX) 200 MG capsule Take 1 capsule (200 mg total) by mouth daily as needed.  . cetaphil (CETAPHIL) lotion Apply 1 application topically 2 (two) times daily. For rash. Apply after washing with [facility] shower gel and rinsing with clear warm water.  . DULoxetine (CYMBALTA) 60 MG capsule Take 1 capsule (60 mg total) by mouth daily. WITH A FULL STOMACH AT SUPPER TIME  . fexofenadine (ALLEGRA) 180 MG tablet Take 1 tablet (180 mg total) by mouth daily. For allergy symptoms  . fluocinonide-emollient (LIDEX-E) 0.05 % cream Apply 1 application topically 2 (two) times daily. To affected areas  . fluticasone (FLONASE) 50 MCG/ACT nasal spray Place 2 sprays into both nostrils daily as needed for allergies or rhinitis.  . fluticasone furoate-vilanterol (BREO ELLIPTA) 100-25 MCG/INH AEPB Inhale 1 puff into the lungs daily.  . Ipratropium-Albuterol (COMBIVENT RESPIMAT) 20-100 MCG/ACT AERS respimat Inhale 1 puff into the lungs every 6 (six) hours  . isosorbide mononitrate (IMDUR) 60 MG 24 hr tablet Take 1 tablet (60 mg total) by mouth daily.  . metoprolol tartrate (LOPRESSOR) 25 MG tablet Take 1 tablet (25 mg total) by mouth 2 (two) times daily.  . mirtazapine (REMERON) 15 MG tablet Take 1 tablet (15 mg total) by mouth at bedtime. For sleep  . nitroGLYCERIN (NITROSTAT) 0.4 MG SL tablet Place 1 tablet (0.4 mg total) under the tongue every 5 (five) minutes  x 3 doses as needed for chest pain.  . pantoprazole (PROTONIX) 40 MG tablet TAKE  (1)  TABLET TWICE A DAY.  . tamsulosin (FLOMAX) 0.4 MG CAPS capsule Take 2 capsules (0.8 mg total) by mouth daily after supper.   Facility-Administered Encounter Medications as of 05/09/2020  Medication  . cyanocobalamin ((VITAMIN B-12)) injection 1,000 mcg   [name] spoke via phone with client about client needs. [name] informed client that [facility] would be transporting him on his appointment  on 05/09/2020 to and from [facility].  Client understood this information. [name] also talked with client about his scheduled 05/15/2020 appointment with Dr. Livia Snellen. [name] informed client that [facility] would transport him to and from his appointment on 05/15/2020 at [facility]. Client understood this information.  Follow Up Plan: [name] to call client in next 4 weeks to talk with client about social work needs of client at that time  Cliff Damiani.Huntington Leverich MSW, [name] Licensed Clinical Social Worker [facility] Care Management [phone]

## 2020-05-09 NOTE — Patient Instructions (Addendum)
Licensed Clinical Social Worker Visit Information  Materials Provided: No  05/09/2020  Name: [name]          MRN: [id number]       DOB: [birth year]  [name] is a 65 y.o. year old male who is a primary care patient of [name], James Gash, MD. The CCM team was consulted for assistance with [name] .   Review of patient status, including review of consultants reports, other relevant assessments, and collaboration with appropriate care team members and the patient's provider was performed as part of comprehensive patient evaluation and provision of chronic care management services.    SDOH (Social Determinants of Health) assessments performed: No; risk for depression; risk for tobacco use; risk for transport needs; risk for social isolation  [name] spoke via phone with client about client needs. [name] informed client that [facility] would be transporting him on his appointment on 05/09/2020 to and from [facility].  Client understood this information. [name] also talked with client about his scheduled 05/15/2020 appointment with Dr. Livia Hale. [name] informed client that [facility] would [facility] him to and from his appointment on 05/15/2020 at [facility]. Client understood this information.  Follow Up Plan: [name] to call client in next 4 weeks to talk with client about social work needs of client at that time  The patient verbalized understanding of instructions provided today and declined a print copy of patient instruction materials.   [name] MSW, [name] Licensed Clinical Social Worker [facility] Care Management [phone]

## 2020-05-10 ENCOUNTER — Telehealth: Payer: Self-pay | Admitting: Family Medicine

## 2020-05-10 LAB — CBC WITH DIFFERENTIAL/PLATELET
Basophils Absolute: 0.1 10*3/uL (ref 0.0–0.2)
Basos: 1 %
EOS (ABSOLUTE): 1 10*3/uL — ABNORMAL HIGH (ref 0.0–0.4)
Eos: 9 %
Hematocrit: 43.8 % (ref 37.5–51.0)
Hemoglobin: 15.7 g/dL (ref 13.0–17.7)
Immature Grans (Abs): 0 10*3/uL (ref 0.0–0.1)
Immature Granulocytes: 0 %
Lymphocytes Absolute: 3.5 10*3/uL — ABNORMAL HIGH (ref 0.7–3.1)
Lymphs: 32 %
MCH: 34.8 pg — ABNORMAL HIGH (ref 26.6–33.0)
MCHC: 35.8 g/dL — ABNORMAL HIGH (ref 31.5–35.7)
MCV: 97 fL (ref 79–97)
Monocytes Absolute: 0.7 10*3/uL (ref 0.1–0.9)
Monocytes: 6 %
Neutrophils Absolute: 5.8 10*3/uL (ref 1.4–7.0)
Neutrophils: 52 %
Platelets: 223 10*3/uL (ref 150–450)
RBC: 4.51 x10E6/uL (ref 4.14–5.80)
RDW: 12.1 % (ref 11.6–15.4)
WBC: 11 10*3/uL — ABNORMAL HIGH (ref 3.4–10.8)

## 2020-05-10 LAB — CMP14+EGFR
ALT: 17 IU/L (ref 0–44)
AST: 19 IU/L (ref 0–40)
Albumin/Globulin Ratio: 1.4 (ref 1.2–2.2)
Albumin: 4.2 g/dL (ref 3.8–4.8)
Alkaline Phosphatase: 80 IU/L (ref 44–121)
BUN/Creatinine Ratio: 14 (ref 10–24)
BUN: 12 mg/dL (ref 8–27)
Bilirubin Total: 0.5 mg/dL (ref 0.0–1.2)
CO2: 32 mmol/L — ABNORMAL HIGH (ref 20–29)
Calcium: 9.4 mg/dL (ref 8.6–10.2)
Chloride: 99 mmol/L (ref 96–106)
Creatinine, Ser: 0.88 mg/dL (ref 0.76–1.27)
GFR calc Af Amer: 106 mL/min/{1.73_m2} (ref >59)
GFR calc non Af Amer: 92 mL/min/{1.73_m2} (ref >59)
Globulin, Total: 3 g/dL (ref 1.5–4.5)
Glucose: 85 mg/dL (ref 65–99)
Potassium: 4.7 mmol/L (ref 3.5–5.2)
Sodium: 140 mmol/L (ref 134–144)
Total Protein: 7.2 g/dL (ref 6.0–8.5)

## 2020-05-10 NOTE — Telephone Encounter (Signed)
MMM,  I sent this to you earlier and then sent it to Dr. Livia Snellen.  Since he is out of the office will you take a look again please.  Patient was seen yesterday by Dr. Livia Snellen.  I know you cannot prescribe pain medication without an office visit.  Is there something OTC you could recommend until Dr. Livia Snellen can take a look at it on Monday?

## 2020-05-10 NOTE — Telephone Encounter (Signed)
Pt calling to let Dr Livia Snellen know that he is still in pain and would like to have something called in. Had appt yesterday and said that he brought it up to Dr Livia Snellen then. Took a BC before bed but it did not help and he could not sleep.

## 2020-05-10 NOTE — Telephone Encounter (Signed)
NTBS for pain meds

## 2020-05-10 NOTE — Telephone Encounter (Signed)
The only otc MEDS IS MOTRIN OR TYLENOL

## 2020-05-10 NOTE — Telephone Encounter (Signed)
Dr. Livia Snellen, I know you are out of the office today but if you get a chance will you please take a look at this.  Patient was seen yesterday.

## 2020-05-10 NOTE — Telephone Encounter (Signed)
05/10/2020  Patient called [name] complaining of side pain and that tylenol is not helping. I know the message was forwarded to Dr Livia Snellen but I wasn't sure if he would see it since he is out of the office today. His note is incomplete from yesterday so it's difficult to tell what he recommended. Can someone give him a call with recommendations for management of his acute pain?  Forwarding to [facility] Clinical Staff  [name], BSN, RN-BC [place] [facility] [place] Management Direct Dial: [phone]

## 2020-05-12 ENCOUNTER — Encounter: Payer: Self-pay | Admitting: Family Medicine

## 2020-05-12 NOTE — Progress Notes (Signed)
Hello Jeramih,  Your lab result is normal and/or stable.Some minor variations that are not significant are commonly marked abnormal, but do not represent any medical problem for you.  Best regards, [name], M.D.

## 2020-05-13 ENCOUNTER — Telehealth: Payer: Self-pay

## 2020-05-13 NOTE — Telephone Encounter (Signed)
Patient aware of recommendations.  

## 2020-05-13 NOTE — Telephone Encounter (Signed)
Left detailed message per DPR °

## 2020-05-14 ENCOUNTER — Other Ambulatory Visit: Payer: Self-pay | Admitting: Family Medicine

## 2020-05-14 ENCOUNTER — Telehealth: Payer: Self-pay | Admitting: Family Medicine

## 2020-05-14 DIAGNOSIS — R109 Unspecified abdominal pain: Secondary | ICD-10-CM

## 2020-05-14 NOTE — Telephone Encounter (Signed)
Patient states he was unhappy because he was having pain in his side and was not prescribed anything for the pain, was only told to take Tylenol or Ibuprofen.

## 2020-05-14 NOTE — Telephone Encounter (Signed)
Patient aware that he will be contacted to arrange appointment for CT scan.

## 2020-05-14 NOTE — Telephone Encounter (Signed)
Not a good idea. Did he say why?

## 2020-05-14 NOTE — Telephone Encounter (Signed)
Pt would like to switch from Dr Livia Snellen and see a different provider

## 2020-05-14 NOTE — Telephone Encounter (Signed)
To check it out further, would he like for me to order a CT scan?

## 2020-05-14 NOTE — Telephone Encounter (Signed)
Patient reports he is still having the side pain and would like to investigate further and have a CT scan.

## 2020-05-14 NOTE — Telephone Encounter (Signed)
I ordered the CT

## 2020-05-15 ENCOUNTER — Ambulatory Visit (INDEPENDENT_AMBULATORY_CARE_PROVIDER_SITE_OTHER): Payer: Medicare Other

## 2020-05-15 ENCOUNTER — Encounter: Payer: Medicare Other | Admitting: Family Medicine

## 2020-05-15 ENCOUNTER — Other Ambulatory Visit: Payer: Self-pay

## 2020-05-15 DIAGNOSIS — Z23 Encounter for immunization: Secondary | ICD-10-CM

## 2020-05-15 NOTE — Progress Notes (Signed)
   Covid-19 Vaccination Clinic  Name:  [name]    MRN: [id number] DOB: Oct 06, 1955  05/15/2020  [name] was observed post Covid-19 immunization for 15 minutes without incident. He was provided with Vaccine Information Sheet and instruction to access the V-Safe system.   [name] was instructed to call 911 with any severe reactions post vaccine: [name] Difficulty breathing  . Swelling of face and throat  . A fast heartbeat  . A bad rash all over body  . Dizziness and weakness   Immunizations Administered    Name Date Dose VIS Date Route   Moderna COVID-19 Vaccine 05/15/2020  9:07 AM 0.5 mL 02/07/2020 Intramuscular   Manufacturer: Moderna   Lot: [id number]   [place]: [id number]

## 2020-05-17 ENCOUNTER — Ambulatory Visit: Payer: Medicare Other | Admitting: *Deleted

## 2020-05-17 DIAGNOSIS — I10 Essential (primary) hypertension: Secondary | ICD-10-CM

## 2020-05-17 DIAGNOSIS — G809 Cerebral palsy, unspecified: Secondary | ICD-10-CM

## 2020-05-17 DIAGNOSIS — J441 Chronic obstructive pulmonary disease with (acute) exacerbation: Secondary | ICD-10-CM

## 2020-05-17 DIAGNOSIS — R531 Weakness: Secondary | ICD-10-CM

## 2020-05-17 DIAGNOSIS — R109 Unspecified abdominal pain: Secondary | ICD-10-CM

## 2020-05-17 NOTE — Chronic Care Management (AMB) (Signed)
Chronic Care Management   CCM RN Visit Note  05/17/2020 Name: [name] MRN: [id number] DOB: [birth year]  Subjective: [name] is a 65 y.o. year old male who is a primary care patient of [name], [name], MD. The care management team was consulted for assistance with disease management and care coordination needs.    Engaged with patient by telephone for follow up visit in response to provider referral for case management and/or care coordination services.   Consent to Services:  The patient was given information about Chronic Care Management services, agreed to services, and gave verbal consent prior to initiation of services.  Please see initial visit note for detailed documentation.   Patient agreed to services and verbal consent obtained.   Assessment: Review of patient past medical history, allergies, medications, health status, including review of consultants reports, laboratory and other test data, was performed as part of comprehensive evaluation and provision of chronic care management services.   SDOH (Social Determinants of Health) assessments and interventions performed:    CCM Care Plan  Allergies  Allergen Reactions  . Chantix [Varenicline Tartrate] Nausea And Vomiting    Outpatient Encounter Medications as of 05/17/2020  Medication Sig  . acetaminophen (TYLENOL) 325 MG tablet Take 2 tablets (650 mg total) by mouth every 6 (six) hours as needed for mild pain or headache.  . albuterol (PROAIR HFA) 108 (90 Base) MCG/ACT inhaler 1-2 puffs every 6 hours as needed wheezing or shortness of breath.  [name] aspirin EC 81 MG tablet Take 1 tablet (81 mg total) by mouth daily.  [name] atorvastatin (LIPITOR) 40 MG tablet TAKE 1 TABLET ONCE DAILY AT 6PM  . buPROPion (WELLBUTRIN SR) 150 MG 12 hr tablet Take 1 tablet (150 mg total) by mouth daily with breakfast.  . butalbital-acetaminophen-caffeine (ESGIC) 50-325-40 MG tablet Take 1 tablet by mouth every 6 (six) hours as needed for  headache.  . celecoxib (CELEBREX) 200 MG capsule Take 1 capsule (200 mg total) by mouth daily as needed.  . cetaphil (CETAPHIL) lotion Apply 1 application topically 2 (two) times daily. For rash. Apply after washing with [facility] shower gel and rinsing with clear warm water.  . DULoxetine (CYMBALTA) 60 MG capsule Take 1 capsule (60 mg total) by mouth daily. WITH A FULL STOMACH AT SUPPER TIME  . fexofenadine (ALLEGRA) 180 MG tablet Take 1 tablet (180 mg total) by mouth daily. For allergy symptoms  . fluocinonide-emollient (LIDEX-E) 0.05 % cream Apply 1 application topically 2 (two) times daily. To affected areas  . fluticasone (FLONASE) 50 MCG/ACT nasal spray Place 2 sprays into both nostrils daily as needed for allergies or rhinitis.  . fluticasone furoate-vilanterol (BREO ELLIPTA) 100-25 MCG/INH AEPB Inhale 1 puff into the lungs daily.  . Ipratropium-Albuterol (COMBIVENT RESPIMAT) 20-100 MCG/ACT AERS respimat Inhale 1 puff into the lungs every 6 (six) hours  . isosorbide mononitrate (IMDUR) 60 MG 24 hr tablet Take 1 tablet (60 mg total) by mouth daily.  . metoprolol tartrate (LOPRESSOR) 25 MG tablet Take 1 tablet (25 mg total) by mouth 2 (two) times daily.  . mirtazapine (REMERON) 15 MG tablet Take 1 tablet (15 mg total) by mouth at bedtime. For sleep  . nitroGLYCERIN (NITROSTAT) 0.4 MG SL tablet Place 1 tablet (0.4 mg total) under the tongue every 5 (five) minutes x 3 doses as needed for chest pain.  . pantoprazole (PROTONIX) 40 MG tablet TAKE  (1)  TABLET TWICE A DAY.  . tamsulosin (FLOMAX) 0.4 MG CAPS capsule Take  2 capsules (0.8 mg total) by mouth daily after supper.   Facility-Administered Encounter Medications as of 05/17/2020  Medication  . cyanocobalamin ((VITAMIN B-12)) injection 1,000 mcg    Patient Active Problem List   Diagnosis Date Noted  . Degenerative disc disease, lumbar 01/16/2019  . Depression   . Hypokalemia 06/27/2018  . Seizure disorder ([place]) 02/07/2018  . Apraxia  of speech 10/13/2017  . Left-sided weakness 10/13/2017  . Coronary artery disease of native artery of native heart with stable angina pectoris ([place])   . Illiteracy 10/23/2016  . Carpal tunnel syndrome of right wrist 06/15/2016  . GAD (generalized anxiety disorder) 05/01/2016  . History of MI (myocardial infarction) 02/26/2015  . Dyslipidemia, goal LDL below 70 10/08/2009  . Infantile cerebral palsy ([place]) 10/08/2009  . Essential hypertension 10/08/2009    Conditions to be addressed/monitored: Infantile Cerebral Palsy with cognitive deficits, GAD, illiteracy, apraxia of speech, angina, hx of MI, CAD, dyslipidemia,B12 deficiency, eczema, DDD, Depression, HTN, COPD   .  Recurrent Falls and Weakness (pt-stated)   Not on track     [place] (see longitudinal plan of care for additional care plan information)  Current Barriers:  . Care Coordination needs related to new onset of recurrent falls in a patient with infantile cerebral palsy, HTN, and COPD (disease states) . Cognitive Deficits  Nurse Case Manager Clinical Goal(s):  [name] Over the next 30 days, patient will reach out to [facility] or PCP with any new or worsening symptoms . Over the next 30 days, patient will demonstrate understanding of fall prevention strategies as evidenced by a decrease in falls . Over the next 7 days, patient will work with outpatient PT to schedule an initial assessment for strength and gait training  Interventions:  . Inter-disciplinary care team collaboration (see longitudinal plan of care) . Chart reviewed including recent office notes and lab results . Medications reviewed and discussed. No recent medication changes.  . Discussed frequent falls . Discussed weakness and potential causes for falls . Encouraged patient to use cane for all ambulation . Reinforced the need to change positions slowly and to stand for 30 seconds or so after arising from a seated position before he starts to walk . Discussed that  falls are affecting his ability to perform ADLs and IADLs . Previously collaborated with [facility] clinical staff to schedule visit with PCP for 05/15/20 for evaluation to order [facility] physical therapy and to assess for medical cause for weakness and falls o Patient cancelled appointment . Collaborated with [facility] Referral Coordinator regarding referral for Home Health PT o She was unable to arrange [place] referral was sent to Outpatient PT in [place] and after our discussion she was going to reach out to their scheduler to initiate appointment scheduling . Advised patient that [facility] isn't an option right now and they he will be getting a call from [facility] Outpatient PT to schedule a visit in [place]. He was agreeable to this. . Advised that the CCM team can assist with transportation arrangements once the appointment is scheduled . Discussed family/social support o Oldest sister died last year. She was his primary support. o Has two other sisters that were helping but, per patient, haven't called or came by very much since 02/2020.  [name] He does receive in-home care services for two hours 5 days a week o Patient would like to increase these hours o Reached out to [employer] who is the Scientist, physiological for [employer] o Confirmed that they  received our request in Oct 2021 for an increase in hours and reported that hours were increased but could not give me details o They provided me with the agency name and number and asked that I call them to confirm hours.  o I reached out to [place] by telephone at [phone] and was asked to fax a request for information regarding hours o Faxed request sent through [facility] to [phone] o Collaborated with Jamelle Haring, LPN who is physically working in the office at [facility] and advised that a faxed confirmation from [place] should be coming into the main fax line within the next few days . Discussed right flank/rib pain o Pain is improving but has  not resolved o Patient had a negative CXR with rib detail at visit on 05/14/20 with Dr Livia Snellen o He was frustrated that he didn't have an answer for his pain and wasn't given any medication but advised to take OTC Tylenol or Aleve o CT scan was ordered but patient refused . Explained that further evaluation of his pain is needed in order to determine the cause of the pain since there was no injury and that a prescription pain medication may have not been appropriate o Patient seemed to accept this explanation  o Patient then agreed to CT scan o Collaborated with [facility] Referral Coordinator and asked that she reactive the order and schedule at [facility] per patient's preference o CCM team can arrange transportation . Encouraged patient to talk with [name] regarding psychosocial issues . Encouraged patient to reach out to PCP or RNCM with any new symptoms and to call if he experiences any additional falls . Encouraged patient to keep cell phone with him at all times  Patient Self Care Activities:  . Has an aide that comes in daily to help with some ADLs . Does not drive and has assistance from sister and [name] . Takes prepackaged medications as directed . Calls PCP with any medical concerns         Plan:  Telephone follow up appointment with care management team member scheduled for:  05/30/20 with RN Care Manager  The patient has been provided with contact information for the care management team and has been advised to call with any health related questions or concerns  The patient will call CCM Team as advised to arrange transportation for PT and CT appointments.    [name], BSN, RN-BC Embedded Chronic Care Manager [facility] / [place] [facility] Direct Dial: [phone]

## 2020-05-17 NOTE — Patient Instructions (Addendum)
Visit Information  Goals Addressed    .  Recurrent Falls and Weakness (pt-stated)   Not on track     [place] (see longitudinal plan of care for additional care plan information)  Current Barriers:  . Care Coordination needs related to new onset of recurrent falls in a patient with infantile cerebral palsy, HTN, and COPD (disease states) . Cognitive Deficits  Nurse Case Manager Clinical Goal(s):  [name] Over the next 30 days, patient will reach out to [facility] or PCP with any new or worsening symptoms . Over the next 30 days, patient will demonstrate understanding of fall prevention strategies as evidenced by a decrease in falls . Over the next 7 days, patient will work with outpatient PT to schedule an initial assessment for strength and gait training  Interventions:  . Inter-disciplinary care team collaboration (see longitudinal plan of care) . Chart reviewed including recent office notes and lab results . Medications reviewed and discussed. No recent medication changes.  . Discussed frequent falls . Discussed weakness and potential causes for falls . Encouraged patient to use cane for all ambulation . Reinforced the need to change positions slowly and to stand for 30 seconds or so after arising from a seated position before he starts to walk . Discussed that falls are affecting his ability to perform ADLs and IADLs . Previously collaborated with [facility] clinical staff to schedule visit with PCP for 05/15/20 for evaluation to order [facility] physical therapy and to assess for medical cause for weakness and falls o Patient cancelled appointment . Collaborated with [facility] Referral Coordinator regarding referral for Home Health PT o She was unable to arrange [place] referral was sent to Outpatient PT in [place] and after our discussion she was going to reach out to their scheduler to initiate appointment scheduling . Advised patient that [facility] isn't an option right now and they he will be getting a  call from [facility] Outpatient PT to schedule a visit in [place]. He was agreeable to this. . Advised that the CCM team can assist with transportation arrangements once the appointment is scheduled . Discussed family/social support o Oldest sister died last year. She was his primary support. o Has two other sisters that were helping but, per patient, haven't called or came by very much since 02/2020.  [name] He does receive in-home care services for two hours 5 days a week o Patient would like to increase these hours o Reached out to [employer] who is the Scientist, physiological for [employer] o Confirmed that they received our request in Oct 2021 for an increase in hours and reported that hours were increased but could not give me details o They provided me with the agency name and number and asked that I call them to confirm hours.  o I reached out to [place] by telephone at [phone] and was asked to fax a request for information regarding hours o Faxed request sent through [facility] to [phone] o Collaborated with Jamelle Haring, LPN who is physically working in the office at [facility] and advised that a faxed confirmation from [facility] should be coming into the main fax line within the next few days . Discussed right flank/rib pain o Pain is improving but has not resolved o Patient had a negative CXR with rib detail at visit on 05/14/20 with Dr Livia Snellen o He was frustrated that he didn't have an answer for his pain and wasn't given any medication but advised to  take OTC Tylenol or Aleve o CT scan was ordered but patient refused . Explained that further evaluation of his pain is needed in order to determine the cause of the pain since there was no injury and that a prescription pain medication may have not been appropriate o Patient seemed to accept this explanation  o Patient then agreed to CT scan o Collaborated with [facility] Referral Coordinator and asked that she reactive the order and  schedule at [facility] per patient's preference o CCM team can arrange transportation . Encouraged patient to talk with [name] regarding psychosocial issues . Encouraged patient to reach out to PCP or RNCM with any new symptoms and to call if he experiences any additional falls . Encouraged patient to keep cell phone with him at all times  Patient Self Care Activities:  . Has an aide that comes in daily to help with some ADLs . Does not drive and has assistance from sister and [name] . Takes prepackaged medications as directed . Calls PCP with any medical concerns  Please see past updates related to this goal by clicking on the "Past Updates" button in the selected goal        (Addendum) Personal Care Services     Current Barriers:  . Care Coordination needs related to [facility]) in a patient with cerebral palsy, hypertension, COPD, and frequent falls . Unable to independently drive . Lacks social connections . Unable to perform IADLs independently . Unable to independently manage his medical conditions  Nurse Case Manager Clinical Goal(s):  [name] Over the next 30 days, patient will work with Consulting civil engineer to address needs related to PCS  Interventions:  . 1:1 collaboration with [name], MD regarding development and update of comprehensive plan of care as evidenced by provider attestation and co-signature . Inter-disciplinary care team collaboration (see longitudinal plan of care) . Chart reviewed including relevant office notes, referrals, and orders for PCS . Reached out to [employer] who is the Scientist, physiological for [employer]  . Talked with [place] [phone]) in response to the faxed request [phone]) that I sent to verify patient's PCS hours o He is receiving 80 hours a month which is typically 2-3 hours per day, 7 days per week o This was increased from 55, which he was receiving prior to our request for increased hours  in Oct 2021 o Hours are flexible as long as they equal 80 per month o Theadora Rama is Heritage manager current Aide. They work very well together.  o Per Librarian, academic, she is able to go with him to appointments if they are both comfortable with that arrangement and if it fits within her scheduled hours.  o Hours can be increased if PCP feels it is necessary and can send in new order with supporting documentation . RNCM will collaborate with [facility] clinical staff regarding new order for increased hours . RNCM will let patient know that Theadora Rama may be able to go with him to CT scan appointment if they both feel comfortable with that. Patient was concerned about going alone.            Patient verbalizes understanding of instructions provided today and agrees to view in [place].    Plan:  Telephone follow up appointment with care management team member scheduled for:  05/30/20 with RN Care Manager  The patient has been provided with contact information for the care management team and has been advised to call with any health  related questions or concerns  The patient will call CCM Team as advised to arrange transportation for PT and CT appointments.    [name], BSN, RN-BC Embedded Chronic Care Manager [facility] / [place] [facility] Direct Dial: [phone]

## 2020-05-20 ENCOUNTER — Ambulatory Visit: Payer: Medicare Other | Admitting: *Deleted

## 2020-05-20 DIAGNOSIS — I10 Essential (primary) hypertension: Secondary | ICD-10-CM

## 2020-05-20 DIAGNOSIS — R531 Weakness: Secondary | ICD-10-CM

## 2020-05-20 DIAGNOSIS — G809 Cerebral palsy, unspecified: Secondary | ICD-10-CM

## 2020-05-20 NOTE — Chronic Care Management (AMB) (Signed)
Addendum to Care Plan  05/17/2020     Personal Care Services        Current Barriers:  . Care Coordination needs related to [place] [facility]) in a patient with cerebral palsy, hypertension, COPD, and frequent falls . Unable to independently drive . Lacks social connections . Unable to perform IADLs independently . Unable to independently manage his medical conditions  Nurse Case Manager Clinical Goal(s):  [name] Over the next 30 days, patient will work with Consulting civil engineer to address needs related to PCS  Interventions:  . 1:1 collaboration with [name], MD regarding development and update of comprehensive plan of care as evidenced by provider attestation and co-signature . Inter-disciplinary care team collaboration (see longitudinal plan of care) . Chart reviewed including relevant office notes, referrals, and orders for PCS . Reached out to [employer] who is the Scientist, physiological for [employer]  . Talked with [facility] [phone]) in response to the faxed request [phone]) that I sent to verify patient's PCS hours o He is receiving 80 hours a month which is typically 2-3 hours per day, 7 days per week o This was increased from 55, which he was receiving prior to our request for increased hours in Oct 2021 o Hours are flexible as long as they equal 80 per month o Theadora Rama is Heritage manager current Aide. They work very well together.  o Per Librarian, academic, she is able to go with him to appointments if they are both comfortable with that arrangement and if it fits within her scheduled hours.  o Hours can be increased if PCP feels it is necessary and can send in new order with supporting documentation . RNCM will collaborate with [facility] clinical staff regarding new order for increased hours . RNCM will let patient know that Theadora Rama may be able to go with him to CT scan appointment if they both feel comfortable with that. Patient was concerned about going  alone.            [name], BSN, RN-BC Embedded Chronic Care Manager [facility] / [place] Management Direct Dial: [phone]

## 2020-05-22 ENCOUNTER — Ambulatory Visit: Payer: Self-pay | Admitting: Licensed Clinical Social Worker

## 2020-05-22 DIAGNOSIS — G809 Cerebral palsy, unspecified: Secondary | ICD-10-CM

## 2020-05-22 DIAGNOSIS — K219 Gastro-esophageal reflux disease without esophagitis: Secondary | ICD-10-CM

## 2020-05-22 DIAGNOSIS — M51369 Other intervertebral disc degeneration, lumbar region without mention of lumbar back pain or lower extremity pain: Secondary | ICD-10-CM

## 2020-05-22 DIAGNOSIS — M5136 Other intervertebral disc degeneration, lumbar region: Secondary | ICD-10-CM

## 2020-05-22 DIAGNOSIS — R482 Apraxia: Secondary | ICD-10-CM

## 2020-05-22 DIAGNOSIS — I252 Old myocardial infarction: Secondary | ICD-10-CM

## 2020-05-22 DIAGNOSIS — J441 Chronic obstructive pulmonary disease with (acute) exacerbation: Secondary | ICD-10-CM

## 2020-05-22 DIAGNOSIS — I25118 Atherosclerotic heart disease of native coronary artery with other forms of angina pectoris: Secondary | ICD-10-CM

## 2020-05-22 DIAGNOSIS — E538 Deficiency of other specified B group vitamins: Secondary | ICD-10-CM

## 2020-05-22 DIAGNOSIS — F3341 Major depressive disorder, recurrent, in partial remission: Secondary | ICD-10-CM

## 2020-05-22 DIAGNOSIS — F411 Generalized anxiety disorder: Secondary | ICD-10-CM

## 2020-05-22 DIAGNOSIS — I10 Essential (primary) hypertension: Secondary | ICD-10-CM

## 2020-05-22 NOTE — Patient Instructions (Addendum)
Licensed Clinical Social Worker Visit Information  Materials Provided: No  05/22/2020  Name: [name]          MRN: [id number]       DOB: 1955-09-26  [name] is a 65 y.o. year old male who is a primary care patient of [name], [name], MD. The CCM team was consulted for assistance with [name] .   Review of patient status, including review of consultants reports, other relevant assessments, and collaboration with appropriate care team members and the patient's provider was performed as part of comprehensive patient evaluation and provision of chronic care management services.    SDOH (Social Determinants of Health) assessments performed: No; risk for social isolation; risk for depression; risk for physical inactivity; risk for stress; risk for transport needs  [name] called [name] transport today and arranged [name] transport for client for 05/28/2020 client appointment at [facility] for B12 injection.  [name] called client today and informed client of above information.  Follow Up Plan: [name] to call client in next 4 weeks to discuss social work needs of client at that time  The patient verbalized understanding of instructions provided today and declined a print copy of patient instruction materials.   [name].Kissy Cielo MSW, [name] Licensed Clinical Social Worker [facility] Care Management [phone]

## 2020-05-22 NOTE — Chronic Care Management (AMB) (Signed)
Chronic Care Management    Clinical Social Work Follow Up Note  05/22/2020 Name: [name] MRN: [id number] DOB: 1955/04/26  [name] is a 65 y.o. year old male who is a primary care patient of [name], [name], MD. The CCM team was consulted for assistance with [name] .   Review of patient status, including review of consultants reports, other relevant assessments, and collaboration with appropriate care team members and the patient's provider was performed as part of comprehensive patient evaluation and provision of chronic care management services.    SDOH (Social Determinants of Health) assessments performed: No; risk for social isolation; risk for depression; risk for physical inactivity; risk for stress; risk for transport needs  Flowsheet Row Chronic Care Management from 02/21/2019 in [place]  PHQ-9 Total Score 9     GAD 7 : Generalized Anxiety Score 04/10/2019 05/01/2016 03/31/2016  Nervous, Anxious, on Edge 0 1 3  Control/stop worrying 0 1 3  Worry too much - different things 0 1 3  Trouble relaxing 0 0 2  Restless 0 0 2  Easily annoyed or irritable 0 0 0  Afraid - awful might happen 0 0 0  Total GAD 7 Score 0 3 13  Anxiety Difficulty Not difficult at all Not difficult at all Not difficult at all    Outpatient Encounter Medications as of 05/22/2020  Medication Sig  . acetaminophen (TYLENOL) 325 MG tablet Take 2 tablets (650 mg total) by mouth every 6 (six) hours as needed for mild pain or headache.  . albuterol (PROAIR HFA) 108 (90 Base) MCG/ACT inhaler 1-2 puffs every 6 hours as needed wheezing or shortness of breath.  [name] aspirin EC 81 MG tablet Take 1 tablet (81 mg total) by mouth daily.  [name] atorvastatin (LIPITOR) 40 MG tablet TAKE 1 TABLET ONCE DAILY AT 6PM  . buPROPion (WELLBUTRIN SR) 150 MG 12 hr tablet Take 1 tablet (150 mg total) by mouth daily with breakfast.  . butalbital-acetaminophen-caffeine (ESGIC) 50-325-40 MG tablet  Take 1 tablet by mouth every 6 (six) hours as needed for headache.  . celecoxib (CELEBREX) 200 MG capsule Take 1 capsule (200 mg total) by mouth daily as needed.  . cetaphil (CETAPHIL) lotion Apply 1 application topically 2 (two) times daily. For rash. Apply after washing with [facility] shower gel and rinsing with clear warm water.  . DULoxetine (CYMBALTA) 60 MG capsule Take 1 capsule (60 mg total) by mouth daily. WITH A FULL STOMACH AT SUPPER TIME  . fexofenadine (ALLEGRA) 180 MG tablet Take 1 tablet (180 mg total) by mouth daily. For allergy symptoms  . fluocinonide-emollient (LIDEX-E) 0.05 % cream Apply 1 application topically 2 (two) times daily. To affected areas  . fluticasone (FLONASE) 50 MCG/ACT nasal spray Place 2 sprays into both nostrils daily as needed for allergies or rhinitis.  . fluticasone furoate-vilanterol (BREO ELLIPTA) 100-25 MCG/INH AEPB Inhale 1 puff into the lungs daily.  . Ipratropium-Albuterol (COMBIVENT RESPIMAT) 20-100 MCG/ACT AERS respimat Inhale 1 puff into the lungs every 6 (six) hours  . isosorbide mononitrate (IMDUR) 60 MG 24 hr tablet Take 1 tablet (60 mg total) by mouth daily.  . metoprolol tartrate (LOPRESSOR) 25 MG tablet Take 1 tablet (25 mg total) by mouth 2 (two) times daily.  . mirtazapine (REMERON) 15 MG tablet Take 1 tablet (15 mg total) by mouth at bedtime. For sleep  . nitroGLYCERIN (NITROSTAT) 0.4 MG SL tablet Place 1 tablet (0.4 mg total) under the tongue every  5 (five) minutes x 3 doses as needed for chest pain.  . pantoprazole (PROTONIX) 40 MG tablet TAKE  (1)  TABLET TWICE A DAY.  . tamsulosin (FLOMAX) 0.4 MG CAPS capsule Take 2 capsules (0.8 mg total) by mouth daily after supper.   Facility-Administered Encounter Medications as of 05/22/2020  Medication  . cyanocobalamin ((VITAMIN B-12)) injection 1,000 mcg     [name] called [name] transport today and arranged [name] transport for client for 05/28/2020 client appointment at [facility] for B12 injection.   [name] called client today and informed client of above information.  Follow Up Plan: [name] to call client in next 4 weeks to discuss social work needs of client at that time  Luay Balding.Yarima Penman MSW, [name] Licensed Clinical Social Worker [facility] Care Management [phone]

## 2020-05-27 ENCOUNTER — Ambulatory Visit (INDEPENDENT_AMBULATORY_CARE_PROVIDER_SITE_OTHER): Payer: Medicare Other | Admitting: Licensed Clinical Social Worker

## 2020-05-27 DIAGNOSIS — F411 Generalized anxiety disorder: Secondary | ICD-10-CM

## 2020-05-27 DIAGNOSIS — G809 Cerebral palsy, unspecified: Secondary | ICD-10-CM

## 2020-05-27 DIAGNOSIS — M5136 Other intervertebral disc degeneration, lumbar region: Secondary | ICD-10-CM

## 2020-05-27 DIAGNOSIS — I10 Essential (primary) hypertension: Secondary | ICD-10-CM

## 2020-05-27 DIAGNOSIS — K219 Gastro-esophageal reflux disease without esophagitis: Secondary | ICD-10-CM

## 2020-05-27 DIAGNOSIS — J441 Chronic obstructive pulmonary disease with (acute) exacerbation: Secondary | ICD-10-CM

## 2020-05-27 DIAGNOSIS — F3341 Major depressive disorder, recurrent, in partial remission: Secondary | ICD-10-CM

## 2020-05-27 DIAGNOSIS — I25118 Atherosclerotic heart disease of native coronary artery with other forms of angina pectoris: Secondary | ICD-10-CM

## 2020-05-27 DIAGNOSIS — R482 Apraxia: Secondary | ICD-10-CM

## 2020-05-27 DIAGNOSIS — I252 Old myocardial infarction: Secondary | ICD-10-CM

## 2020-05-27 DIAGNOSIS — E538 Deficiency of other specified B group vitamins: Secondary | ICD-10-CM

## 2020-05-27 NOTE — Chronic Care Management (AMB) (Signed)
Chronic Care Management    Clinical Social Work Follow Up Note  05/27/2020 Name: [name] MRN: [id number] DOB: 1955-10-25  [name] is a 65 yHaleo. year old male who is a primary care patient of [name], [name], MD. The CCM team was consulted for assistance with [employer] .   Review of patient status, including review of consultants reports, other relevant assessments, and collaboration with appropriate care team members and the patient's provider was performed as part of comprehensive patient evaluation and provision of chronic care management services.    SDOH (Social Determinants of Health) assessments performed: No; risk for stress; risk for physical inactivity; risk for depression; risk for transport needs  Flowsheet Row Chronic Care Management from 02/21/2019 in [place]  PHQ-9 Total Score 9     GAD 7 : Generalized Anxiety Score 04/10/2019 05/01/2016 03/31/2016  Nervous, Anxious, on Edge 0 1 3  Control/stop worrying 0 1 3  Worry too much - different things 0 1 3  Trouble relaxing 0 0 2  Restless 0 0 2  Easily annoyed or irritable 0 0 0  Afraid - awful might happen 0 0 0  Total GAD 7 Score 0 3 13  Anxiety Difficulty Not difficult at all Not difficult at all Not difficult at all    Outpatient Encounter Medications as of 05/27/2020  Medication Sig  . acetaminophen (TYLENOL) 325 MG tablet Take 2 tablets (650 mg total) by mouth every 6 (six) hours as needed for mild pain or headache.  . albuterol (PROAIR HFA) 108 (90 Base) MCG/ACT inhaler 1-2 puffs every 6 hours as needed wheezing or shortness of breath.  [name] aspirin EC 81 MG tablet Take 1 tablet (81 mg total) by mouth daily.  [name] atorvastatin (LIPITOR) 40 MG tablet TAKE 1 TABLET ONCE DAILY AT 6PM  . buPROPion (WELLBUTRIN SR) 150 MG 12 hr tablet Take 1 tablet (150 mg total) by mouth daily with breakfast.  . butalbital-acetaminophen-caffeine (ESGIC) 50-325-40 MG tablet Take 1 tablet by mouth every  6 (six) hours as needed for headache.  . celecoxib (CELEBREX) 200 MG capsule Take 1 capsule (200 mg total) by mouth daily as needed.  . cetaphil (CETAPHIL) lotion Apply 1 application topically 2 (two) times daily. For rash. Apply after washing with [facility] shower gel and rinsing with clear warm water.  . DULoxetine (CYMBALTA) 60 MG capsule Take 1 capsule (60 mg total) by mouth daily. WITH A FULL STOMACH AT SUPPER TIME  . fexofenadine (ALLEGRA) 180 MG tablet Take 1 tablet (180 mg total) by mouth daily. For allergy symptoms  . fluocinonide-emollient (LIDEX-E) 0Hale05 % cream Apply 1 application topically 2 (two) times daily. To affected areas  . fluticasone (FLONASE) 50 MCG/ACT nasal spray Place 2 sprays into both nostrils daily as needed for allergies or rhinitis.  . fluticasone furoate-vilanterol (BREO ELLIPTA) 100-25 MCG/INH AEPB Inhale 1 puff into the lungs daily.  . Ipratropium-Albuterol (COMBIVENT RESPIMAT) 20-100 MCG/ACT AERS respimat Inhale 1 puff into the lungs every 6 (six) hours  . isosorbide mononitrate (IMDUR) 60 MG 24 hr tablet Take 1 tablet (60 mg total) by mouth daily.  . metoprolol tartrate (LOPRESSOR) 25 MG tablet Take 1 tablet (25 mg total) by mouth 2 (two) times daily.  . mirtazapine (REMERON) 15 MG tablet Take 1 tablet (15 mg total) by mouth at bedtime. For sleep  . nitroGLYCERIN (NITROSTAT) 0Hale4 MG SL tablet Place 1 tablet (0Hale4 mg total) under the tongue every 5 (five) minutes x  3 doses as needed for chest pain.  . pantoprazole (PROTONIX) 40 MG tablet TAKE  (1)  TABLET TWICE A DAY.  . tamsulosin (FLOMAX) 0Hale4 MG CAPS capsule Take 2 capsules (0Hale8 mg total) by mouth daily after supper.   Facility-Administered Encounter Medications as of 05/27/2020  Medication  . cyanocobalamin ((VITAMIN B-12)) injection 1,000 mcg    [name] talked with client today via phone. [name] talked with client about client recent appointment with Dr. Livia Hale.  [name] talked with client today about his Outpatient  Physical Therapy sessions. Client said he has appointment this Friday with Outpatient Physical Therapy in [place], [place]. Client said he had called [name] today and [name] for his physical therapy appointment this Friday.  Follow Up Plan: [name] to call client in next 30 days to assess client needs at that time  James HaleHaleJames Hale MSW, [name] Licensed Clinical Social Worker [facility] Care Management [phone]

## 2020-05-27 NOTE — Patient Instructions (Addendum)
Licensed Clinical Social Worker Visit Information  Materials Provided: No  05/27/2020  Name: [name]          MRN: [id number]       DOB: 10-12-1955  [name] is a 65 y.o. year old male who is a primary care patient of [name], [name], MD. The CCM team was consulted for assistance with [name] .   Review of patient status, including review of consultants reports, other relevant assessments, and collaboration with appropriate care team members and the patient's provider was performed as part of comprehensive patient evaluation and provision of chronic care management services.    SDOH (Social Determinants of Health) assessments performed: No; risk for stress; risk for physical inactivity; risk for depression; risk for transport needs    [name] talked with client today via phone. [name] talked with client about [name].  [name] talked with client today about his Outpatient Physical Therapy sessions. Client said he has appointment this Friday with Outpatient Physical Therapy in [place], [place]. Client said he had called [name] today and [name] for his physical therapy appointment this Friday.  Follow Up Plan: [name] to call client in next 30 days to assess client needs at that time  The patient verbalized understanding of instructions provided today and declined a print copy of patient instruction materials.   [name].Jesicca Dipierro MSW, [name] Licensed Clinical Social Worker [facility] Care Management [phone]

## 2020-05-28 ENCOUNTER — Ambulatory Visit: Payer: Medicare Other

## 2020-05-30 ENCOUNTER — Ambulatory Visit: Payer: Medicare Other | Admitting: *Deleted

## 2020-05-30 DIAGNOSIS — F3341 Major depressive disorder, recurrent, in partial remission: Secondary | ICD-10-CM | POA: Diagnosis not present

## 2020-05-30 DIAGNOSIS — R531 Weakness: Secondary | ICD-10-CM

## 2020-05-30 DIAGNOSIS — I25118 Atherosclerotic heart disease of native coronary artery with other forms of angina pectoris: Secondary | ICD-10-CM | POA: Diagnosis not present

## 2020-05-30 DIAGNOSIS — I10 Essential (primary) hypertension: Secondary | ICD-10-CM

## 2020-05-30 DIAGNOSIS — I252 Old myocardial infarction: Secondary | ICD-10-CM

## 2020-05-30 DIAGNOSIS — J441 Chronic obstructive pulmonary disease with (acute) exacerbation: Secondary | ICD-10-CM

## 2020-05-30 DIAGNOSIS — Z9181 History of falling: Secondary | ICD-10-CM

## 2020-05-30 DIAGNOSIS — G809 Cerebral palsy, unspecified: Secondary | ICD-10-CM

## 2020-05-31 ENCOUNTER — Other Ambulatory Visit: Payer: Self-pay

## 2020-05-31 ENCOUNTER — Ambulatory Visit: Payer: Medicare Other | Attending: Family Medicine | Admitting: Physical Therapy

## 2020-05-31 ENCOUNTER — Encounter: Payer: Self-pay | Admitting: Physical Therapy

## 2020-05-31 DIAGNOSIS — R262 Difficulty in walking, not elsewhere classified: Secondary | ICD-10-CM | POA: Insufficient documentation

## 2020-05-31 DIAGNOSIS — R296 Repeated falls: Secondary | ICD-10-CM | POA: Insufficient documentation

## 2020-05-31 DIAGNOSIS — M6281 Muscle weakness (generalized): Secondary | ICD-10-CM | POA: Diagnosis not present

## 2020-05-31 DIAGNOSIS — R2681 Unsteadiness on feet: Secondary | ICD-10-CM | POA: Diagnosis not present

## 2020-05-31 NOTE — Therapy (Signed)
[facility] [place], [place], [address] Phone: [phone]   Fax:  [phone]  Physical Therapy Evaluation  Patient Details  Name: [name] MRN: [id number] Date of Birth: July 05, 1955 Referring Provider (PT): [name], MD   Encounter Date: 05/31/2020   PT End of Session - 05/31/20 1210    Visit Number 1    Number of Visits 12    Date for PT Re-Evaluation 07/19/20    Authorization Type United healthcare medicare (CQ modifier & KX modifier) Progress note every 10th visit    PT Start Time 0945    PT Stop Time 1020    PT Time Calculation (min) 35 min    Activity Tolerance Patient tolerated treatment well    Behavior During Therapy [facility] for tasks assessed/performed           Past Medical History:  Diagnosis Date  . Cerebral palsy ([place])   . Coronary artery disease    LAD 70% stenosis, cath 2019  . Depression   . GERD (gastroesophageal reflux disease)   . Hypertension   . Scoliosis   . Seizures ([place])    pt's sister states that patinet has had seizures in the past, pt camr for PAT by himself on last visit and she stst "they misunderstood what he was trying to say. Pt saw neurologist in March who did CT and states patient does not have seizures. On no meds, had seizures as child.    Past Surgical History:  Procedure Laterality Date  . CARPAL TUNNEL RELEASE Right 08/13/2016   Procedure: CARPAL TUNNEL RELEASE;  Surgeon: Carole Civil, MD;  Location: [facility];  Service: Orthopedics;  Laterality: Right;  . CORONARY ANGIOPLASTY WITH STENT PLACEMENT    . LEFT HEART CATH AND CORONARY ANGIOGRAPHY N/A 07/10/2017   Procedure: LEFT HEART CATH AND CORONARY ANGIOGRAPHY;  Surgeon: Troy Sine, MD;  Location: [place] [facility];  Service: Cardiovascular;  Laterality: N/A;    There were no vitals filed for this visit.    Subjective Assessment - 05/31/20 1154    Subjective COVID-19 screening performed upon arrival.  Patient arrives to physical therapy with reports of history of falls and progressive weakness. Patient reports gaining assistance with ADLs, home activities, and showering from his home health aide. Patient reports a history of falls and requires a sturdy surface to get himself up. Patient reports more falls toward the right. Patient requires increased time for transfers from sit to stand due to feeling "wobbly" Patient denies pain. Patient's goals are to improve movement, improve balance, and stop falling.    Pertinent History History of falls, HTN, CAD, Seizure disorder, infantile cerebral palsy, history of MI    Limitations Walking;House hold activities;Standing;Sitting    Patient Stated Goals stop falling    Currently in Pain? No/denies              [facility] PT Assessment - 05/31/20 0001      Assessment   Medical Diagnosis Left sided weakness, at high risk for falls    Referring Provider (PT) [name], MD    Onset Date/Surgical Date --   ongoing   Next MD Visit to be made    Prior Therapy no      Precautions   Precautions Fall      Restrictions   Weight Bearing Restrictions No      Balance Screen   Has the patient fallen in the past 6 months Yes    How  many times? multiple    Has the patient had a decrease in activity level because of a fear of falling?  Yes    Is the patient reluctant to leave their home because of a fear of falling?  Yes      [place] Private residence    Living Arrangements Alone    Available Help at Discharge Available PRN/intermittently   M-F, 2 hrs per day     Prior Function   Level of Independence Needs assistance with homemaking;Needs assistance with ADLs;Independent with household mobility with device      ROM / Strength   AROM / PROM / Strength Strength      Strength   Overall Strength Deficits    Strength Assessment Site Hip;Knee    Right/Left Hip Right;Left    Right Hip Flexion 3+/5    Left Hip Flexion  3-/5    Right/Left Knee Left;Right    Right Knee Flexion 3+/5    Right Knee Extension 4-/5    Left Knee Flexion 3+/5    Left Knee Extension 4-/5      Transfers   Five time sit to stand comments  34.4 seconds with UE support      Ambulation/Gait   Ambulation/Gait Yes    Ambulation/Gait Assistance 5: Supervision    Gait Pattern Step-to pattern;Decreased arm swing - left;Decreased stance time - right;Decreased step length - left;Decreased stride length;Decreased hip/knee flexion - left;Decreased weight shift to left;Lateral trunk lean to right;Wide base of support      Balance   Balance Assessed Yes      Static Standing Balance   Static Standing - Balance Support No upper extremity supported    Static Standing - Level of Assistance 5: Stand by assistance    Static Standing Balance -  Activities  Romberg - Eyes Opened;Romberg - Eyes Closed    Static Standing - Comment/# of Minutes 1 minute Romberg; 10 seconds eyes closed romberg      Standardized Balance Assessment   Standardized Balance Assessment 10 meter walk test    10 Meter Walk 11.4 seconds =0.53 m/s                      Objective measurements completed on examination: See above findings.               PT Education - 05/31/20 1210    Education Details seated HR and TR, LAQ    Person(s) Educated Patient    Methods Explanation;Demonstration;Handout    Comprehension Verbalized understanding               PT Long Term Goals - 05/31/20 1218      PT LONG TERM GOAL #1   Title Patient will be independent with HEP    Time 6    Period Weeks    Status New      PT LONG TERM GOAL #2   Title Patient will demonstrate 4/5 or greater bilateral LE MMT to improve stability during functional tasks.    Time 6    Period Weeks    Status New      PT LONG TERM GOAL #3   Title Patient will perform modified 5x sit to stand with UE support in 22 seconds or less to decrease risk of falls.    Time 6    Period  Weeks    Status New      PT LONG TERM GOAL #4  Title Patient will demonstrate a 4+ point improvement on the Berg Balance Scale to decrease risk of falls.    Time 6    Period Weeks    Status New                  Plan - 05/31/20 1212    Clinical Impression Statement Patient is a 65 year old male who presents to physical therapy with bilateral LE weakness, decreased balance, and difficulty walking that has progressed over the past few years. Patient's 5x sit to stand with UE support time of 34.4 seconds categorizes him as a fall risk with decreased LE functional strength. Patient (+) romberg and required contact guard to maintain balance. Patient and PT discussed HEP and goals for PT to which patient reported understanding. Patient would benefit from skilled physical therapy to address deficits and address patient's goals.    Personal Factors and Comorbidities Comorbidity 3+;Time since onset of injury/illness/exacerbation;Age    Comorbidities history of falls, HTN, CAD, Seizure disorder, infantile cerebral palsy, history of MI    Examination-Activity Limitations Stand;Squat;Hygiene/Grooming;Dressing;Toileting;Locomotion Level;Transfers;Bed Mobility;Bathing;Sit    Stability/Clinical Decision Making Evolving/Moderate complexity    Clinical Decision Making Moderate    Rehab Potential Fair    PT Frequency 2x / week    PT Duration 6 weeks    PT Treatment/Interventions ADLs/Self Care Home Management;Gait training;Stair training;Functional mobility training;Therapeutic activities;Therapeutic exercise;Balance training;Neuromuscular re-education;Manual techniques;Passive range of motion;Patient/family education    PT Next Visit Plan Please complete Berg; Nustep, LE strengthening, core stability, balance activities in various positions.    PT Home Exercise Plan see patient education section    Consulted and Agree with Plan of Care Patient           Patient will benefit from skilled  therapeutic intervention in order to improve the following deficits and impairments:  Decreased activity tolerance,Decreased balance,Decreased strength,Abnormal gait,Difficulty walking,Pain,Postural dysfunction  Visit Diagnosis: Muscle weakness (generalized) - Plan: PT plan of care cert/re-cert  Unsteadiness on feet - Plan: PT plan of care cert/re-cert  Repeated falls - Plan: PT plan of care cert/re-cert  Difficulty in walking, not elsewhere classified - Plan: PT plan of care cert/re-cert     Problem List Patient Active Problem List   Diagnosis Date Noted  . Degenerative disc disease, lumbar 01/16/2019  . Depression   . Hypokalemia 06/27/2018  . Seizure disorder ([place]) 02/07/2018  . Apraxia of speech 10/13/2017  . Left-sided weakness 10/13/2017  . Coronary artery disease of native artery of native heart with stable angina pectoris ([place])   . Illiteracy 10/23/2016  . Carpal tunnel syndrome of right wrist 06/15/2016  . GAD (generalized anxiety disorder) 05/01/2016  . History of MI (myocardial infarction) 02/26/2015  . Dyslipidemia, goal LDL below 70 10/08/2009  . Infantile cerebral palsy ([place]) 10/08/2009  . Essential hypertension 10/08/2009    Gabriela Eves, PT, DPT 05/31/2020, 12:28 PM  [facility] [address] [place], [place], [address] Phone: [phone]   Fax:  [phone]  Name: [name] MRN: [id number] Date of Birth: 09/06/1955

## 2020-06-05 ENCOUNTER — Ambulatory Visit: Payer: Medicare Other | Admitting: Physical Therapy

## 2020-06-05 ENCOUNTER — Other Ambulatory Visit: Payer: Self-pay

## 2020-06-05 DIAGNOSIS — R2681 Unsteadiness on feet: Secondary | ICD-10-CM

## 2020-06-05 DIAGNOSIS — R296 Repeated falls: Secondary | ICD-10-CM | POA: Diagnosis not present

## 2020-06-05 DIAGNOSIS — R262 Difficulty in walking, not elsewhere classified: Secondary | ICD-10-CM

## 2020-06-05 DIAGNOSIS — M6281 Muscle weakness (generalized): Secondary | ICD-10-CM | POA: Diagnosis not present

## 2020-06-05 NOTE — Therapy (Signed)
[facility] [place], [place], [address] Phone: [phone]   Fax:  [phone]  Physical Therapy Treatment  Patient Details  Name: [name] MRN: [id number] Date of Birth: November 12, 1955 Referring Provider (PT): [name], MD   Encounter Date: 06/05/2020   PT End of Session - 06/05/20 0952    Visit Number 2    Number of Visits 12    Date for PT Re-Evaluation 07/19/20    Authorization Type United healthcare medicare (CQ modifier & KX modifier) Progress note every 10th visit    PT Start Time 0945    PT Stop Time 1028    PT Time Calculation (min) 43 min    Activity Tolerance Patient tolerated treatment well    Behavior During Therapy [facility] for tasks assessed/performed           Past Medical History:  Diagnosis Date  . Cerebral palsy ([place])   . Coronary artery disease    LAD 70% stenosis, cath 2019  . Depression   . GERD (gastroesophageal reflux disease)   . Hypertension   . Scoliosis   . Seizures ([place])    pt's sister states that patinet has had seizures in the past, pt camr for PAT by himself on last visit and she stst "they misunderstood what he was trying to say. Pt saw neurologist in March who did CT and states patient does not have seizures. On no meds, had seizures as child.    Past Surgical History:  Procedure Laterality Date  . CARPAL TUNNEL RELEASE Right 08/13/2016   Procedure: CARPAL TUNNEL RELEASE;  Surgeon: Carole Civil, MD;  Location: [facility];  Service: Orthopedics;  Laterality: Right;  . CORONARY ANGIOPLASTY WITH STENT PLACEMENT    . LEFT HEART CATH AND CORONARY ANGIOGRAPHY N/A 07/10/2017   Procedure: LEFT HEART CATH AND CORONARY ANGIOGRAPHY;  Surgeon: Troy Sine, MD;  Location: [place] [facility];  Service: Cardiovascular;  Laterality: N/A;    There were no vitals filed for this visit.   Subjective Assessment - 06/05/20 0946    Subjective COVID-19 screening performed upon arrival.  Patient arrived doing well.    Pertinent History History of falls, HTN, CAD, Seizure disorder, infantile cerebral palsy, history of MI    Limitations Walking;House hold activities;Standing;Sitting    Patient Stated Goals stop falling    Currently in Pain? No/denies              [facility] PT Assessment - 06/05/20 0001      Standardized Balance Assessment   Standardized Balance Assessment Berg Balance Test      Berg Balance Test   Sit to Stand Able to stand  independently using hands    Standing Unsupported Able to stand 30 seconds unsupported    Sitting with Back Unsupported but Feet Supported on Floor or Stool Able to sit safely and securely 2 minutes    Stand to Sit Controls descent by using hands    Transfers Able to transfer safely, definite need of hands    Standing Unsupported with Eyes Closed Able to stand 10 seconds with supervision    Standing Unsupported with Feet Together Able to place feet together independently but unable to hold for 30 seconds    From Standing, Reach Forward with Outstretched Arm Can reach forward >5 cm safely (2")    From Standing Position, Pick up Object from Floor Able to pick up shoe, needs supervision    From Standing Position, Turn to Look  Behind Over each Shoulder Turn sideways only but maintains balance    Turn 360 Degrees Able to turn 360 degrees safely one side only in 4 seconds or less    Standing Unsupported, Alternately Place Feet on Step/Stool Able to complete >2 steps/needs minimal assist    Standing Unsupported, One Foot in Front Needs help to step but can hold 15 seconds    Standing on One Leg Unable to try or needs assist to prevent fall    Total Score 32                         [facility] Adult PT Treatment/Exercise - 06/05/20 0001      Exercises   Exercises Knee/Hip;Lumbar      Lumbar Exercises: Seated   Other Seated Lumbar Exercises seated for Core sets press and holds x16min    Other Seated Lumbar Exercises seated 2#  ball for truck rotations 2x10 and overhead 2x10      Knee/Hip Exercises: Aerobic   Nustep L3 x 15min LE only      Knee/Hip Exercises: Seated   Long Arc Quad Strengthening;Both;2 sets;10 reps    Long Arc Quad Weight 3 lbs.    Ball Squeeze 29min    Clamshell with TheraBand Red   x30   Marching Strengthening;Both;2 sets;10 reps;Weights    Marching Weights 3 lbs.    Hamstring Curl Strengthening;Both;20 reps    Hamstring Limitations red bands               Balance Exercises - 06/05/20 0001      Balance Exercises: Standing   Standing Eyes Opened Narrow base of support (BOS);Wide (BOA);Foam/compliant surface;Solid surface   several reps and then with airex balance and with trunk rotaion for progression   Standing Eyes Closed Wide (BOA);Solid surface;2 reps;10 secs    Tandem Stance Eyes open;Intermittent upper extremity support;2 reps    SLS Limitations    Marching Solid surface;20 reps   Bil LE   Heel Raises Both;20 reps    Sit to Stand Elevated surface   x10   Other Standing Exercises 4" toe taps 2x10                  PT Long Term Goals - 06/05/20 1018      PT LONG TERM GOAL #1   Title Patient will be independent with HEP    Time 6    Period Weeks    Status On-going      PT LONG TERM GOAL #2   Title Patient will demonstrate 4/5 or greater bilateral LE MMT to improve stability during functional tasks.    Time 6    Period Weeks    Status On-going      PT LONG TERM GOAL #3   Title Patient will perform modified 5x sit to stand with UE support in 22 seconds or less to decrease risk of falls.    Time 6    Period Weeks    Status On-going      PT LONG TERM GOAL #4   Title Patient will demonstrate a 4+ point improvement on the Berg Balance Scale to decrease risk of falls.    Baseline 32/56 06/05/20    Time 6    Period Weeks    Status On-going                 Plan - 06/05/20 1019    Clinical Impression Statement Patient tolerated treatment  well today.  patient required educational cues for technique and pace today. Patient did well with BERG score at 32/56 with SBA in paralel bars for safety. Patient had a few episodes of LOB yet able to recover with bars for assit. Patient did well with seated strength exercises and able to complete. Patient current goals ongoing.    Personal Factors and Comorbidities Comorbidity 3+;Time since onset of injury/illness/exacerbation;Age    Comorbidities history of falls, HTN, CAD, Seizure disorder, infantile cerebral palsy, history of MI    Examination-Activity Limitations Stand;Squat;Hygiene/Grooming;Dressing;Toileting;Locomotion Level;Transfers;Bed Mobility;Bathing;Sit    Stability/Clinical Decision Making Evolving/Moderate complexity    Rehab Potential Fair    PT Frequency 2x / week    PT Duration 6 weeks    PT Treatment/Interventions ADLs/Self Care Home Management;Gait training;Stair training;Functional mobility training;Therapeutic activities;Therapeutic exercise;Balance training;Neuromuscular re-education;Manual techniques;Passive range of motion;Patient/family education    PT Next Visit Plan cont with POC for  Nustep, LE strengthening, core stability, balance activities in various positions.    Consulted and Agree with Plan of Care Patient           Patient will benefit from skilled therapeutic intervention in order to improve the following deficits and impairments:  Decreased activity tolerance,Decreased balance,Decreased strength,Abnormal gait,Difficulty walking,Pain,Postural dysfunction  Visit Diagnosis: Muscle weakness (generalized)  Unsteadiness on feet  Repeated falls  Difficulty in walking, not elsewhere classified     Problem List Patient Active Problem List   Diagnosis Date Noted  . Degenerative disc disease, lumbar 01/16/2019  . Depression   . Hypokalemia 06/27/2018  . Seizure disorder ([place]) 02/07/2018  . Apraxia of speech 10/13/2017  . Left-sided weakness 10/13/2017  .  Coronary artery disease of native artery of native heart with stable angina pectoris ([place])   . Illiteracy 10/23/2016  . Carpal tunnel syndrome of right wrist 06/15/2016  . GAD (generalized anxiety disorder) 05/01/2016  . History of MI (myocardial infarction) 02/26/2015  . Dyslipidemia, goal LDL below 70 10/08/2009  . Infantile cerebral palsy ([name]) 10/08/2009  . Essential hypertension 10/08/2009    Phillips Climes, PTA 06/05/2020, 10:29 AM  [facility] [place], [place], [address] Phone: [phone]   Fax:  [phone]  Name: [name] MRN: [id number] Date of Birth: 02-Feb-1956

## 2020-06-07 ENCOUNTER — Ambulatory Visit: Payer: Medicare Other | Admitting: Physical Therapy

## 2020-06-07 ENCOUNTER — Ambulatory Visit: Payer: Self-pay | Admitting: Licensed Clinical Social Worker

## 2020-06-07 ENCOUNTER — Other Ambulatory Visit: Payer: Self-pay

## 2020-06-07 DIAGNOSIS — I10 Essential (primary) hypertension: Secondary | ICD-10-CM

## 2020-06-07 DIAGNOSIS — F3341 Major depressive disorder, recurrent, in partial remission: Secondary | ICD-10-CM

## 2020-06-07 DIAGNOSIS — M5136 Other intervertebral disc degeneration, lumbar region: Secondary | ICD-10-CM

## 2020-06-07 DIAGNOSIS — J441 Chronic obstructive pulmonary disease with (acute) exacerbation: Secondary | ICD-10-CM

## 2020-06-07 DIAGNOSIS — M6281 Muscle weakness (generalized): Secondary | ICD-10-CM | POA: Diagnosis not present

## 2020-06-07 DIAGNOSIS — R296 Repeated falls: Secondary | ICD-10-CM | POA: Diagnosis not present

## 2020-06-07 DIAGNOSIS — K219 Gastro-esophageal reflux disease without esophagitis: Secondary | ICD-10-CM

## 2020-06-07 DIAGNOSIS — R2681 Unsteadiness on feet: Secondary | ICD-10-CM

## 2020-06-07 DIAGNOSIS — E538 Deficiency of other specified B group vitamins: Secondary | ICD-10-CM

## 2020-06-07 DIAGNOSIS — I252 Old myocardial infarction: Secondary | ICD-10-CM

## 2020-06-07 DIAGNOSIS — F411 Generalized anxiety disorder: Secondary | ICD-10-CM

## 2020-06-07 DIAGNOSIS — R262 Difficulty in walking, not elsewhere classified: Secondary | ICD-10-CM | POA: Diagnosis not present

## 2020-06-07 DIAGNOSIS — R482 Apraxia: Secondary | ICD-10-CM

## 2020-06-07 DIAGNOSIS — G809 Cerebral palsy, unspecified: Secondary | ICD-10-CM

## 2020-06-07 NOTE — Patient Instructions (Addendum)
Licensed Clinical Social Worker Visit Information  Goals we discussed today:    Current Barriers:   Film/video editor.   Literacy barriers  Transportation barriers in client with Chronic Diagnoses of DDD, Depressin Apraxia of Speech, CAD GAD, Hx MI, HTN, Infantile Cerebral Palsy   Cognitive Deficits  Nurse Case Manager Clinical Goal(s):  Over the next14days, patient will have transportation arranged forhis nextB12 injection [facility] 2022at[facility]    Interventions:  Called client and informed client that his next B12 injection appointment at [facility] was scheduled for 06/12/2020 at 10:30 AM.  Informed client that [name] is scheduled to transport client to and from his 06/12/2020 appointment at [facility]  Talked with client about physical therapy sessions client is receiving 2 times weekly in [place], [place] that client is receiving physical therapy sessions 2 times weekly in [place], [place]   Patient Self Care Activities:   Currently UNABLE TO independently drive to medical appointments.   Please see past updates related to this goal by clicking on the "Past Updates" button in the selected goal     Follow Up Plan:[name] to call client in next 4 weeks to discuss social work needs of client at that time  Materials Provided: No  The patient verbalized understanding of instructions provided today and declined a print copy of patient instruction materials.   [name].[name] MSW, [name] Licensed Clinical Social Worker [facility] Care Management [phone]

## 2020-06-07 NOTE — Therapy (Signed)
[facility] [place], [place], [address] Phone: [phone]   Fax:  [phone]  Physical Therapy Treatment  Patient Details  Name: [name] MRN: [id number] Date of Birth: 12/08/1955 Referring Provider (PT): [name], MD   Encounter Date: 06/07/2020   PT End of Session - 06/07/20 1152    Visit Number 3    Number of Visits 12    Date for PT Re-Evaluation 07/19/20    Authorization Type United healthcare medicare (CQ modifier & KX modifier) Progress note every 10th visit    PT Start Time 1115    PT Stop Time 1156    PT Time Calculation (min) 41 min    Activity Tolerance Patient tolerated treatment well    Behavior During Therapy [facility] for tasks assessed/performed           Past Medical History:  Diagnosis Date  . Cerebral palsy ([place])   . Coronary artery disease    LAD 70% stenosis, cath 2019  . Depression   . GERD (gastroesophageal reflux disease)   . Hypertension   . Scoliosis   . Seizures ([place])    pt's sister states that patinet has had seizures in the past, pt camr for PAT by himself on last visit and she stst "they misunderstood what he was trying to say. Pt saw neurologist in March who did CT and states patient does not have seizures. On no meds, had seizures as child.    Past Surgical History:  Procedure Laterality Date  . CARPAL TUNNEL RELEASE Right 08/13/2016   Procedure: CARPAL TUNNEL RELEASE;  Surgeon: Carole Civil, MD;  Location: [facility];  Service: Orthopedics;  Laterality: Right;  . CORONARY ANGIOPLASTY WITH STENT PLACEMENT    . LEFT HEART CATH AND CORONARY ANGIOGRAPHY N/A 07/10/2017   Procedure: LEFT HEART CATH AND CORONARY ANGIOGRAPHY;  Surgeon: Troy Sine, MD;  Location: [facility] CV [facility];  Service: Cardiovascular;  Laterality: N/A;    There were no vitals filed for this visit.   Subjective Assessment - 06/07/20 1203    Subjective COVID-19 screening performed upon arrival.  Patient reported feeling sore after last session but doing well. Has been consistent with performing HEP    Pertinent History History of falls, HTN, CAD, Seizure disorder, infantile cerebral palsy, history of MI    Limitations Walking;House hold activities;Standing;Sitting    Patient Stated Goals stop falling    Currently in Pain? No/denies                             [facility] Adult PT Treatment/Exercise - 06/07/20 0001      Lumbar Exercises: Seated   Other Seated Lumbar Exercises seated for Core sets and oblique sets x20 each      Knee/Hip Exercises: Aerobic   Nustep L3 x 91min LE only      Knee/Hip Exercises: Standing   Heel Raises Both;20 reps    Hip Flexion Both;Knee straight;Other (comment)   alternating step taps on 6" step x2 minutes   Hip Abduction AROM;Both;Knee straight;1 set;10 reps    Rocker Board 3 minutes   for calf stretching     Knee/Hip Exercises: Seated   Long Arc Quad Strengthening;Both;2 sets;10 reps    Long Arc Quad Weight 2 lbs.    Clamshell with TheraBand Yellow   x20   Hamstring Curl Strengthening;Both;20 reps    Hamstring Limitations yellow theraband      Knee/Hip  Exercises: Supine   Short Arc Quad Sets Strengthening;Both;20 reps    Short Arc Quad Sets Limitations 2#    Other Supine Knee/Hip Exercises ball squeeze x3 minutes    Other Supine Knee/Hip Exercises supine marching x3 minutes                       PT Long Term Goals - 06/05/20 1018      PT LONG TERM GOAL #1   Title Patient will be independent with HEP    Time 6    Period Weeks    Status On-going      PT LONG TERM GOAL #2   Title Patient will demonstrate 4/5 or greater bilateral LE MMT to improve stability during functional tasks.    Time 6    Period Weeks    Status On-going      PT LONG TERM GOAL #3   Title Patient will perform modified 5x sit to stand with UE support in 22 seconds or less to decrease risk of falls.    Time 6    Period Weeks     Status On-going      PT LONG TERM GOAL #4   Title Patient will demonstrate a 4+ point improvement on the Berg Balance Scale to decrease risk of falls.    Baseline 32/56 06/05/20    Time 6    Period Weeks    Status On-going                 Plan - 06/07/20 1153    Clinical Impression Statement Patinet arrives to physical therapy with reports of soreness after last session. Patient guided through TEs with  verbal cuing, tactile cuing, and demonstration. Patient required intermittent contact guard for safety. Patient was able to complete exercises but with fatigue. Rest breaks provided with good return to exercises with good technique.    Personal Factors and Comorbidities Comorbidity 3+;Time since onset of injury/illness/exacerbation;Age    Comorbidities history of falls, HTN, CAD, Seizure disorder, infantile cerebral palsy, history of MI    Examination-Activity Limitations Stand;Squat;Hygiene/Grooming;Dressing;Toileting;Locomotion Level;Transfers;Bed Mobility;Bathing;Sit    Stability/Clinical Decision Making Evolving/Moderate complexity    Clinical Decision Making Moderate    Rehab Potential Fair    PT Frequency 2x / week    PT Duration 6 weeks    PT Treatment/Interventions ADLs/Self Care Home Management;Gait training;Stair training;Functional mobility training;Therapeutic activities;Therapeutic exercise;Balance training;Neuromuscular re-education;Manual techniques;Passive range of motion;Patient/family education    PT Next Visit Plan cont with POC for  Nustep, LE strengthening, core stability, balance activities in various positions.    PT Home Exercise Plan see patient education section    Consulted and Agree with Plan of Care Patient           Patient will benefit from skilled therapeutic intervention in order to improve the following deficits and impairments:  Decreased activity tolerance,Decreased balance,Decreased strength,Abnormal gait,Difficulty walking,Pain,Postural  dysfunction  Visit Diagnosis: Muscle weakness (generalized)  Unsteadiness on feet  Repeated falls  Difficulty in walking, not elsewhere classified     Problem List Patient Active Problem List   Diagnosis Date Noted  . Degenerative disc disease, lumbar 01/16/2019  . Depression   . Hypokalemia 06/27/2018  . Seizure disorder ([place]) 02/07/2018  . Apraxia of speech 10/13/2017  . Left-sided weakness 10/13/2017  . Coronary artery disease of native artery of native heart with stable angina pectoris ([place])   . Illiteracy 10/23/2016  . Carpal tunnel syndrome of right wrist 06/15/2016  .  GAD (generalized anxiety disorder) 05/01/2016  . History of MI (myocardial infarction) 02/26/2015  . Dyslipidemia, goal LDL below 70 10/08/2009  . Infantile cerebral palsy ([place]) 10/08/2009  . Essential hypertension 10/08/2009    Gabriela Eves, PT, DPT 06/07/2020, 12:06 PM  [facility] [address] [place], [place], [address] Phone: [phone]   Fax:  [phone]  Name: MAANAV KASSABIAN MRN: [id number] Date of Birth: [birth year]

## 2020-06-07 NOTE — Chronic Care Management (AMB) (Signed)
Chronic Care Management    Clinical Social Work Follow Up Note  06/07/2020 Name: [name] MRN: [id number] DOB: 05-13-1955  [name] is a 65 y.o. year old male who is a primary care patient of [name], [name], MD. The CCM team was consulted for assistance with [name] .   Review of patient status, including review of consultants reports, other relevant assessments, and collaboration with appropriate care team members and the patient's provider was performed as part of comprehensive patient evaluation and provision of chronic care management services.    SDOH (Social Determinants of Health) assessments performed: No; risk for depression; risk for tobacco use; risk for physical inactivity; risk for stress; risk for financial strain  Flowsheet Row Chronic Care Management from 02/21/2019 in [place]  PHQ-9 Total Score 9      GAD 7 : Generalized Anxiety Score 04/10/2019 05/01/2016 03/31/2016  Nervous, Anxious, on Edge 0 1 3  Control/stop worrying 0 1 3  Worry too much - different things 0 1 3  Trouble relaxing 0 0 2  Restless 0 0 2  Easily annoyed or irritable 0 0 0  Afraid - awful might happen 0 0 0  Total GAD 7 Score 0 3 13  Anxiety Difficulty Not difficult at all Not difficult at all Not difficult at all    Outpatient Encounter Medications as of 06/07/2020  Medication Sig  . acetaminophen (TYLENOL) 325 MG tablet Take 2 tablets (650 mg total) by mouth every 6 (six) hours as needed for mild pain or headache.  . albuterol (PROAIR HFA) 108 (90 Base) MCG/ACT inhaler 1-2 puffs every 6 hours as needed wheezing or shortness of breath.  [name] aspirin EC 81 MG tablet Take 1 tablet (81 mg total) by mouth daily.  [name] atorvastatin (LIPITOR) 40 MG tablet TAKE 1 TABLET ONCE DAILY AT 6PM  . buPROPion (WELLBUTRIN SR) 150 MG 12 hr tablet Take 1 tablet (150 mg total) by mouth daily with breakfast.  . butalbital-acetaminophen-caffeine (ESGIC) 50-325-40 MG tablet  Take 1 tablet by mouth every 6 (six) hours as needed for headache.  . celecoxib (CELEBREX) 200 MG capsule Take 1 capsule (200 mg total) by mouth daily as needed.  . cetaphil (CETAPHIL) lotion Apply 1 application topically 2 (two) times daily. For rash. Apply after washing with [facility] shower gel and rinsing with clear warm water.  . DULoxetine (CYMBALTA) 60 MG capsule Take 1 capsule (60 mg total) by mouth daily. WITH A FULL STOMACH AT SUPPER TIME  . fexofenadine (ALLEGRA) 180 MG tablet Take 1 tablet (180 mg total) by mouth daily. For allergy symptoms  . fluocinonide-emollient (LIDEX-E) 0.05 % cream Apply 1 application topically 2 (two) times daily. To affected areas  . fluticasone (FLONASE) 50 MCG/ACT nasal spray Place 2 sprays into both nostrils daily as needed for allergies or rhinitis.  . fluticasone furoate-vilanterol (BREO ELLIPTA) 100-25 MCG/INH AEPB Inhale 1 puff into the lungs daily.  . Ipratropium-Albuterol (COMBIVENT RESPIMAT) 20-100 MCG/ACT AERS respimat Inhale 1 puff into the lungs every 6 (six) hours  . isosorbide mononitrate (IMDUR) 60 MG 24 hr tablet Take 1 tablet (60 mg total) by mouth daily.  . metoprolol tartrate (LOPRESSOR) 25 MG tablet Take 1 tablet (25 mg total) by mouth 2 (two) times daily.  . mirtazapine (REMERON) 15 MG tablet Take 1 tablet (15 mg total) by mouth at bedtime. For sleep  . nitroGLYCERIN (NITROSTAT) 0.4 MG SL tablet Place 1 tablet (0.4 mg total) under the tongue  every 5 (five) minutes x 3 doses as needed for chest pain.  . pantoprazole (PROTONIX) 40 MG tablet TAKE  (1)  TABLET TWICE A DAY.  . tamsulosin (FLOMAX) 0.4 MG CAPS capsule Take 2 capsules (0.8 mg total) by mouth daily after supper.   Facility-Administered Encounter Medications as of 06/07/2020  Medication  . cyanocobalamin ((VITAMIN B-12)) injection 1,000 mcg    Goals    Current Barriers:   Film/video editor.   Literacy barriers  Transportation barriers in client with Chronic  Diagnoses of DDD, Depressin Apraxia of Speech, CAD GAD, Hx MI, HTN, Infantile Cerebral Palsy   Cognitive Deficits  Nurse Case Manager Clinical Goal(s):  Over the next14days, patient will have transportation arranged forhis nextB12 [facility]    Interventions:  Called client and informed client that his next B12 injection appointment at [facility] was scheduled for 06/12/2020 at 10:30 AM.  Informed client that [name] is scheduled to transport client to and from his 06/12/2020 appointment at [facility]  Talked with client about physical therapy sessions client is receiving 2 times weekly in [place], [place] that client is receiving physical therapy sessions 2 times weekly in [place], [place]   Patient Self Care Activities:   Currently UNABLE TO independently drive to medical appointments.   Please see past updates related to this goal by clicking on the "Past Updates" button in the selected goal     Follow Up Plan:[name] to call client in next 4 weeks to discuss social work needs of client at that time  Athel Merriweather.Malkia Nippert MSW, [name] Licensed Clinical Social Worker [id number] [facility] Care Management [phone]

## 2020-06-10 ENCOUNTER — Ambulatory Visit: Payer: Medicare Other | Admitting: *Deleted

## 2020-06-10 DIAGNOSIS — I10 Essential (primary) hypertension: Secondary | ICD-10-CM

## 2020-06-10 DIAGNOSIS — J441 Chronic obstructive pulmonary disease with (acute) exacerbation: Secondary | ICD-10-CM | POA: Diagnosis not present

## 2020-06-10 DIAGNOSIS — I25118 Atherosclerotic heart disease of native coronary artery with other forms of angina pectoris: Secondary | ICD-10-CM

## 2020-06-10 DIAGNOSIS — R531 Weakness: Secondary | ICD-10-CM

## 2020-06-10 DIAGNOSIS — F3341 Major depressive disorder, recurrent, in partial remission: Secondary | ICD-10-CM | POA: Diagnosis not present

## 2020-06-10 DIAGNOSIS — I252 Old myocardial infarction: Secondary | ICD-10-CM | POA: Diagnosis not present

## 2020-06-10 DIAGNOSIS — G809 Cerebral palsy, unspecified: Secondary | ICD-10-CM

## 2020-06-10 NOTE — Patient Instructions (Signed)
Care Plan   .  Recurrent Falls and Weakness (pt-stated)       Current Barriers:  . Care Coordination needs related to new onset of recurrent falls in a patient with infantile cerebral palsy, HTN, and COPD (disease states) . Cognitive Deficits  Nurse Case Manager Clinical Goal(s):  [name] Over the next 30 days, patient will reach out to [facility] or PCP with any new or worsening symptoms . Over the next 30 days, patient will demonstrate understanding of fall prevention strategies as evidenced by a decrease in falls . Over the next 7 days, patient will work with outpatient PT to schedule an initial assessment for strength and gait training  Interventions:  . Inter-disciplinary care team collaboration (see longitudinal plan of care) . Chart reviewed including recent office notes and lab results . Medications reviewed and discussed. No recent medication changes.  . Discussed frequent falls . Discussed weakness and potential causes for falls . Encouraged patient to use cane for all ambulation . Reinforced the need to change positions slowly and to stand for 30 seconds or so after arising from a seated position before he starts to walk . Discussed that falls are affecting his ability to perform ADLs and IADLs . Previously collaborated with [facility] clinical staff to schedule visit with PCP for 05/15/20 for evaluation to order [facility] physical therapy and to assess for medical cause for weakness and falls o Patient cancelled appointment . Collaborated with [facility] Referral Coordinator regarding referral for Home Health PT o She was unable to arrange [place] referral was sent to Outpatient PT in [place] and after our discussion she was going to reach out to their scheduler to initiate appointment scheduling . Advised patient that [facility] isn't an option right now and they he will be getting a call from [facility] Outpatient PT to schedule a visit in [place]. He was agreeable to this. . Advised that the CCM team can assist with  transportation arrangements once the appointment is scheduled . Discussed family/social support o Oldest sister died last year. She was his primary support. o Has two other sisters that were helping but, per patient, haven't called or came by very much since 02/2020.  [name] He does receive in-home care services for two hours 5 days a week o Patient would like to increase these hours o Reached out to [employer] who is the Scientist, physiological for [employer] o Confirmed that they received our request in Oct 2021 for an increase in hours and reported that hours were increased but could not give me details o They provided me with the agency name and number and asked that I call them to confirm hours.  o I reached out to [place] by telephone at [phone] and was asked to fax a request for information regarding hours o Faxed request sent through [facility] to [phone] o Collaborated with Jamelle Haring, LPN who is physically working in the office at [facility] and advised that a faxed confirmation from [place] should be coming into the main fax line within the next few days . Discussed right flank/rib pain o Pain is improving but has not resolved o Patient had a negative CXR with rib detail at visit on 05/14/20 with Dr Livia Snellen o He was frustrated that he didn't have an answer for his pain and wasn't given any medication but advised to take OTC Tylenol or Aleve o CT scan was ordered but patient refused . Explained that further evaluation of his pain  is needed in order to determine the cause of the pain since there was no injury and that a prescription pain medication may have not been appropriate o Patient seemed to accept this explanation  o Patient then agreed to CT scan o Collaborated with [facility] Referral Coordinator and asked that she reactive the order and schedule at [facility] per patient's preference o CCM team can arrange transportation . Encouraged patient to talk with [name]  regarding psychosocial issues . Encouraged patient to reach out to PCP or RNCM with any new symptoms and to call if he experiences any additional falls . Encouraged patient to keep cell phone with him at all times  Patient Self Care Activities:  . Has an aide that comes in daily to help with some ADLs . Does not drive and has assistance from sister and [name] . Takes prepackaged medications as directed . Calls PCP with any medical concerns  Please see past updates related to this goal by clicking on the "Past Updates" button in the selected goal        .  Personal Care Services      Current Barriers:  . Care Coordination needs related to [facility]) in a patient with cerebral palsy, hypertension, COPD, and frequent falls . Unable to independently drive . Lacks social connections . Unable to perform IADLs independently . Unable to independently manage his medical conditions  Nurse Case Manager Clinical Goal(s):  [name] Over the next 30 days, patient will work with Consulting civil engineer to address needs related to PCS  Interventions:  . 1:1 collaboration with [name], MD regarding development and update of comprehensive plan of care as evidenced by provider attestation and co-signature . Inter-disciplinary care team collaboration (see longitudinal plan of care) . Chart reviewed including relevant office notes, referrals, and orders for PCS . Reached out to [employer] who is the Scientist, physiological for [employer]  . Talked with Hale [phone]) in response to the faxed request [phone]) that I sent to verify patient's PCS hours o He is receiving 80 hours a month which is typically 2-3 hours per day, 7 days per week o This was increased from 55, which he was receiving prior to our request for increased hours in Oct 2021 o Hours are flexible as long as they equal 80 per month o Theadora Rama is Heritage manager current Aide. They work very well together.   o Per Librarian, academic, she is able to go with him to appointments if they are both comfortable with that arrangement and if it fits within her scheduled hours.  o Hours can be increased if PCP feels it is necessary and can send in new order with supporting documentation . RNCM will collaborate with [facility] clinical staff regarding new order for increased hours . RNCM will let patient know that Theadora Rama may be able to go with him to CT scan appointment if they both feel comfortable with that. Patient was concerned about going alone.       Plan:   The patient has been provided with contact information for the care management team and has been advised to call with any health related questions or concerns.  The care management team will reach out to the patient again over the next 45 days.    [name], BSN, RN-BC Embedded Chronic Care Manager [facility] [place] Management Direct Dial: [phone]

## 2020-06-10 NOTE — Patient Instructions (Addendum)
Visit Information  PATIENT GOALS: Goals Addressed            This Visit's Progress   . Fall Prevention and Improved Strength and Balance       Timeframe:  Short-Term Goal Priority:  High Start Date: 05/30/20                            Expected End Date: 08/27/20  Follow-up: 06/10/20                     . Attend all physical therapy sessions . Work with [employer] and [name] to arrange transportation to therapy sessions . Use cane for ambulation . Move carefully and change positions slowly in order to decrease fall risk    . Increase Personal Care Services Hours       Timeframe:  Short-Term Goal Priority:  Medium Start Date:                             Expected End Date:                       Follow-up: 06/10/20  . Reach out to [facility] and [name] as needed . Work with [employer] . Talk with PCP office regarding order for increased PCS hours    . Track and Manage My Blood Pressure-Hypertension       Timeframe:  Long-Range Goal Priority:  Medium Start Date:                             Expected End Date:                       Follow Up Date 06/10/20    . check blood pressure 3 times per week . write blood pressure results in a log or diary  . Bring blood pressure log to appointments with PCP . Call PCP if you have any readings that are over 140/90 or below 100/70 . Continue to take your prepackaged medication . Call RN Care Manager [phone] if you have any questions about your medicine or your blood pressure    Why is this important?    You won't feel high blood pressure, but it can still hurt your blood vessels.   High blood pressure can cause heart or kidney problems. It can also cause a stroke.   Making lifestyle changes like losing a little weight or eating less salt will help.   Checking your blood pressure at home and at different times of the day can help to control blood pressure.   If the doctor prescribes medicine remember to take it the way the doctor ordered.    Call the office if you cannot afford the medicine or if there are questions about it.     Notes:        Patient verbalizes understanding of instructions provided today and agrees to view in [place].   Follow Up Plan:  Telephone follow up appointment with care management team member scheduled for: 06/10/20 with [facility] The patient has been provided with contact information for the care management team and has been advised to call with any health related questions or concerns.   [name], BSN, RN-BC Embedded Chronic Care Manager [facility] [place] Management Direct Dial: [phone]

## 2020-06-10 NOTE — Chronic Care Management (AMB) (Signed)
Chronic Care Management   CCM RN Visit Note  05/30/2020 Name: [name] MRN: [id number] DOB: 1956-02-21  Subjective: [name] is a 65 y.o. year old male who is a primary care patient of [name], [name], MD. The care management team was consulted for assistance with disease management and care coordination needs.    Engaged with patient by telephone for follow up visit in response to provider referral for case management and/or care coordination services.   Consent to Services:  The patient was given information about Chronic Care Management services, agreed to services, and gave verbal consent prior to initiation of services.  Please see initial visit note for detailed documentation.   Patient agreed to services and verbal consent obtained.   Assessment: Review of patient past medical history, allergies, medications, health status, including review of consultants reports, laboratory and other test data, was performed as part of comprehensive evaluation and provision of chronic care management services.   SDOH (Social Determinants of Health) assessments and interventions performed:    CCM Care Plan  Allergies  Allergen Reactions  . Chantix [Varenicline Tartrate] Nausea And Vomiting    Outpatient Encounter Medications as of 05/30/2020  Medication Sig  . acetaminophen (TYLENOL) 325 MG tablet Take 2 tablets (650 mg total) by mouth every 6 (six) hours as needed for mild pain or headache.  . albuterol (PROAIR HFA) 108 (90 Base) MCG/ACT inhaler 1-2 puffs every 6 hours as needed wheezing or shortness of breath.  [name] aspirin EC 81 MG tablet Take 1 tablet (81 mg total) by mouth daily.  [name] atorvastatin (LIPITOR) 40 MG tablet TAKE 1 TABLET ONCE DAILY AT 6PM  . buPROPion (WELLBUTRIN SR) 150 MG 12 hr tablet Take 1 tablet (150 mg total) by mouth daily with breakfast.  . butalbital-acetaminophen-caffeine (ESGIC) 50-325-40 MG tablet Take 1 tablet by mouth every 6 (six) hours as needed for  headache.  . celecoxib (CELEBREX) 200 MG capsule Take 1 capsule (200 mg total) by mouth daily as needed.  . cetaphil (CETAPHIL) lotion Apply 1 application topically 2 (two) times daily. For rash. Apply after washing with [facility] shower gel and rinsing with clear warm water.  . DULoxetine (CYMBALTA) 60 MG capsule Take 1 capsule (60 mg total) by mouth daily. WITH A FULL STOMACH AT SUPPER TIME  . fexofenadine (ALLEGRA) 180 MG tablet Take 1 tablet (180 mg total) by mouth daily. For allergy symptoms  . fluocinonide-emollient (LIDEX-E) 0.05 % cream Apply 1 application topically 2 (two) times daily. To affected areas  . fluticasone (FLONASE) 50 MCG/ACT nasal spray Place 2 sprays into both nostrils daily as needed for allergies or rhinitis.  . fluticasone furoate-vilanterol (BREO ELLIPTA) 100-25 MCG/INH AEPB Inhale 1 puff into the lungs daily.  . Ipratropium-Albuterol (COMBIVENT RESPIMAT) 20-100 MCG/ACT AERS respimat Inhale 1 puff into the lungs every 6 (six) hours  . isosorbide mononitrate (IMDUR) 60 MG 24 hr tablet Take 1 tablet (60 mg total) by mouth daily.  . metoprolol tartrate (LOPRESSOR) 25 MG tablet Take 1 tablet (25 mg total) by mouth 2 (two) times daily.  . mirtazapine (REMERON) 15 MG tablet Take 1 tablet (15 mg total) by mouth at bedtime. For sleep  . nitroGLYCERIN (NITROSTAT) 0.4 MG SL tablet Place 1 tablet (0.4 mg total) under the tongue every 5 (five) minutes x 3 doses as needed for chest pain.  . pantoprazole (PROTONIX) 40 MG tablet TAKE  (1)  TABLET TWICE A DAY.  . tamsulosin (FLOMAX) 0.4 MG CAPS capsule Take  2 capsules (0.8 mg total) by mouth daily after supper.   Facility-Administered Encounter Medications as of 05/30/2020  Medication  . cyanocobalamin ((VITAMIN B-12)) injection 1,000 mcg    Patient Active Problem List   Diagnosis Date Noted  . Degenerative disc disease, lumbar 01/16/2019  . Depression   . Hypokalemia 06/27/2018  . Seizure disorder ([place]) 02/07/2018  . Apraxia  of speech 10/13/2017  . Left-sided weakness 10/13/2017  . Coronary artery disease of native artery of native heart with stable angina pectoris ([place])   . Illiteracy 10/23/2016  . Carpal tunnel syndrome of right wrist 06/15/2016  . GAD (generalized anxiety disorder) 05/01/2016  . History of MI (myocardial infarction) 02/26/2015  . Dyslipidemia, goal LDL below 70 10/08/2009  . Infantile cerebral palsy ([place]) 10/08/2009  . Essential hypertension 10/08/2009    Conditions to be addressed/monitored:  Infantile Cerebral Palsy with cognitive deficits, GAD, illiteracy, apraxia of speech, angina, hx of MI, CAD, dyslipidemia,B12 deficiency, eczema, DDD, Depression, HTN, COPD   Care Plan : RNCM: Generalized Weakness & Recurrent Falls  Updates made by [name], RN since 06/10/2020 12:00 AM    Problem: Weakness and Falls   Priority: High    Goal: Prevent Falls by Improving Strength and Balance   Start Date: 05/30/2020  This Visit's Progress: Not on track  Priority: High  Note:   Current Barriers:  . Care Coordination needs related to generalized weakness and frequent falls in a patient with infantile cerebral palsy, HTN, and hx of MI . Lacks caregiver support.  . Transportation barriers . Unable to independently drive . Lacks social connections . Cognitive deficits and illiteracy   Nurse Case Manager Clinical Goal(s):  . patient will work with [facility] to address needs related to weakness and recurrent falls . patient will meet with RN Care Manager to address fall prevention  Interventions:  . 1:1 collaboration with [name], MD regarding development and update of comprehensive plan of care as evidenced by provider attestation and co-signature . Inter-disciplinary care team collaboration (see longitudinal plan of care) . Chart reviewed including relevant office notes, lab results, and referral notes . Medications reviewed . Discussed referral for  Outpatient Physical therapy for gait training and strengthening to improve balance and decrease fall risk . Collaborated with [name] regarding transportation to physical therapy appointments twice a week . Encouraged patient to reach out CCM team as needed . Previously completely falls evaluation and discussed fall prevention  Patient Goals/Self-Care Activities Over the next 90 days, patient will: . Attend all physical therapy sessions . Work with [employer] and [name] to arrange transportation to therapy sessions . Use cane for ambulation . Move carefully and change positions slowly in order to decrease fall risk    Care Plan : RNCM: Hypertension (Adult)  Updates made by [name], RN since 06/10/2020 12:00 AM    Problem: Hypertension (Hypertension)   Priority: Medium    Long-Range Goal: Hypertension Monitored   Start Date: 05/30/2020  This Visit's Progress: Not on track  Priority: Medium  Note:   Current Barriers:  . Chronic Disease Management support and education needs related to hypertension . Lacks caregiver support.  . Film/video editor.  . Literacy barriers . Transportation barriers . Cognitive Deficits . Unable to independently drive  Nurse Case Manager Clinical Goal(s):  [name] Over the next 30 days, patient will work with Consulting civil engineer to address needs related to hypertension . Over the next 90 days, patient will check and record  blood pressure daily and bring record to all PCP and cardiology appointments  Interventions:  . 1:1 collaboration with [name], MD regarding development and update of comprehensive plan of care as evidenced by provider attestation and co-signature . Inter-disciplinary care team collaboration (see longitudinal plan of care) . Evaluation of current treatment plan related to hypertension and patient's adherence to plan as established by provider. . Reviewed medications o Medications are prepackaged for the month and delivered by [name] o Patient is compliant with medications o No problems with affordability. Has Medicaid secondary which covers medication cost. . Advised patient to check and record blood pressure daily and as needed . Advised to call PCP with any blood pressure readings that are outside of recommended range . Bring log to PCP and cardiology appointments . Recommended DASH diet . Reviewed upcoming appointments . Discussed transportation through [place] . Encouraged patient to reach out to [facility] team as needed  Patient Goals/Self-Care Activities Over the next 30 days, patient will: . Take prepackaged medication as prescribed . Check and write down blood pressure everyday and as needed . Call your doctor if your blood pressure is too high or too low . Eat a low sodium diet. Do not add extra salt to food.    Care Plan : RNCM: Infantile Cerebral Palsy  Updates made by [name], RN since 06/10/2020 12:00 AM    Problem: In-Home Care Needed   Priority: Medium    Goal: Increase Personal Care Service Hours   This Visit's Progress: Not on track  Priority: Medium  Note:   Current Barriers:  . Care Coordination needs related to Personal Care Services [facility]) in a patient with cerebral palsy, hypertension, COPD, and frequent falls . Unable to independently drive . Lacks caregiver support . Lacks social connections . Unable to perform IADLs independently . Unable to independently manage his medical conditions  Nurse Case Manager Clinical Goal(s):  . patient will work with PCP office to address needs related to increasing Personal Care Service hours . patient will meet with RN Care Manager to address personal care services  Interventions:  . 1:1 collaboration with [name], MD regarding development and update of comprehensive plan of care as evidenced by provider attestation and co-signature . Inter-disciplinary care team collaboration (see longitudinal plan of care) . Chart reviewed  including relevant office notes, referrals, and orders for PCS . Reached out to [employer] who is the Scientist, physiological for [employer]  . Talked with [place] [phone]) in response to the faxed request [phone]) that I sent to verify patient's PCS hours o He is receiving 80 hours a month which is typically 2-3 hours per day, 7 days per week o This was increased from 55, which he was receiving prior to our request for increased hours in Oct 2021 o Hours are flexible as long as they equal 80 per month o Theadora Rama is Heritage manager current Aide. They work very well together.  o Per Librarian, academic, she is able to go with him to appointments if they are both comfortable with that arrangement and if it fits within her scheduled hours.  o Hours can be increased if PCP feels it is necessary and can send in new order with supporting documentation . RNCM will collaborate with [facility] clinical staff regarding new order for increased hours . RNCM will let patient know that Theadora Rama may be able to go with him to CT scan appointment if they both feel comfortable with that. Patient  was concerned about going alone.  Patient Goals/Self-Care Activities Over the next 60 days, patient will: . Reach out to [facility] and [name] as needed . Work with [employer] . Talk with PCP office regarding order for increased PCS hours      Follow Up Plan:  Telephone follow up appointment with care management team member scheduled for: 06/10/20 with [name] The patient has been provided with contact information for the care management team and has been advised to call with any health related questions or concerns.   [name], BSN, RN-BC Embedded Chronic Care Manager [facility] [place] Management Direct Dial: [phone]

## 2020-06-10 NOTE — Chronic Care Management (AMB) (Signed)
Chronic Care Management   Follow Up Note   05/20/2020 Name: HASTON CASEBOLT MRN: [id number] DOB: October 28, 1955  Referred by: [name], MD Reason for referral : Chronic Care Management (RN follow up)   MAXMILIAN TROSTEL is a 65 y.o. year old male who is a primary care patient of [name], [name], MD. The CCM team was consulted for assistance with chronic disease management and care coordination needs.    Review of patient status, including review of consultants reports, relevant laboratory and other test results, and collaboration with appropriate care team members and the patient's provider was performed as part of comprehensive patient evaluation and provision of chronic care management services.    SDOH (Social Determinants of Health) assessments performed: Yes See Care Plan activities for detailed interventions related to [facility])     Outpatient Encounter Medications as of 05/20/2020  Medication Sig  . acetaminophen (TYLENOL) 325 MG tablet Take 2 tablets (650 mg total) by mouth every 6 (six) hours as needed for mild pain or headache.  . albuterol (PROAIR HFA) 108 (90 Base) MCG/ACT inhaler 1-2 puffs every 6 hours as needed wheezing or shortness of breath.  [name] aspirin EC 81 MG tablet Take 1 tablet (81 mg total) by mouth daily.  [name] atorvastatin (LIPITOR) 40 MG tablet TAKE 1 TABLET ONCE DAILY AT 6PM  . buPROPion (WELLBUTRIN SR) 150 MG 12 hr tablet Take 1 tablet (150 mg total) by mouth daily with breakfast.  . butalbital-acetaminophen-caffeine (ESGIC) 50-325-40 MG tablet Take 1 tablet by mouth every 6 (six) hours as needed for headache.  . celecoxib (CELEBREX) 200 MG capsule Take 1 capsule (200 mg total) by mouth daily as needed.  . cetaphil (CETAPHIL) lotion Apply 1 application topically 2 (two) times daily. For rash. Apply after washing with [facility] shower gel and rinsing with clear warm water.  . DULoxetine (CYMBALTA) 60 MG capsule Take 1 capsule (60 mg total) by mouth daily. WITH A FULL  STOMACH AT SUPPER TIME  . fexofenadine (ALLEGRA) 180 MG tablet Take 1 tablet (180 mg total) by mouth daily. For allergy symptoms  . fluocinonide-emollient (LIDEX-E) 0.05 % cream Apply 1 application topically 2 (two) times daily. To affected areas  . fluticasone (FLONASE) 50 MCG/ACT nasal spray Place 2 sprays into both nostrils daily as needed for allergies or rhinitis.  . fluticasone furoate-vilanterol (BREO ELLIPTA) 100-25 MCG/INH AEPB Inhale 1 puff into the lungs daily.  . Ipratropium-Albuterol (COMBIVENT RESPIMAT) 20-100 MCG/ACT AERS respimat Inhale 1 puff into the lungs every 6 (six) hours  . isosorbide mononitrate (IMDUR) 60 MG 24 hr tablet Take 1 tablet (60 mg total) by mouth daily.  . metoprolol tartrate (LOPRESSOR) 25 MG tablet Take 1 tablet (25 mg total) by mouth 2 (two) times daily.  . mirtazapine (REMERON) 15 MG tablet Take 1 tablet (15 mg total) by mouth at bedtime. For sleep  . nitroGLYCERIN (NITROSTAT) 0.4 MG SL tablet Place 1 tablet (0.4 mg total) under the tongue every 5 (five) minutes x 3 doses as needed for chest pain.  . pantoprazole (PROTONIX) 40 MG tablet TAKE  (1)  TABLET TWICE A DAY.  . tamsulosin (FLOMAX) 0.4 MG CAPS capsule Take 2 capsules (0.8 mg total) by mouth daily after supper.   Facility-Administered Encounter Medications as of 05/20/2020  Medication  . cyanocobalamin ((VITAMIN B-12)) injection 1,000 mcg    Care Plan   .  Recurrent Falls and Weakness (pt-stated)       Current Barriers:  . Care Coordination needs related  to new onset of recurrent falls in a patient with infantile cerebral palsy, HTN, and COPD (disease states) . Cognitive Deficits  Nurse Case Manager Clinical Goal(s):  [name] Over the next 30 days, patient will reach out to [facility] or PCP with any new or worsening symptoms . Over the next 30 days, patient will demonstrate understanding of fall prevention strategies as evidenced by a decrease in falls . Over the next 7 days, patient will work with  outpatient PT to schedule an initial assessment for strength and gait training  Interventions:  . Inter-disciplinary care team collaboration (see longitudinal plan of care) . Chart reviewed including recent office notes and lab results . Medications reviewed and discussed. No recent medication changes.  . Discussed frequent falls . Discussed weakness and potential causes for falls . Encouraged patient to use cane for all ambulation . Reinforced the need to change positions slowly and to stand for 30 seconds or so after arising from a seated position before he starts to walk . Discussed that falls are affecting his ability to perform ADLs and IADLs . Previously collaborated with [facility] clinical staff to schedule visit with PCP for 05/15/20 for evaluation to order [facility] physical therapy and to assess for medical cause for weakness and falls o Patient cancelled appointment . Collaborated with [facility] Referral Coordinator regarding referral for Home Health PT o She was unable to arrange [place] referral was sent to Outpatient PT in [place] and after our discussion she was going to reach out to their scheduler to initiate appointment scheduling . Advised patient that [facility] isn't an option right now and they he will be getting a call from [facility] PT to schedule a visit in [place]. He was agreeable to this. . Advised that the CCM team can assist with transportation arrangements once the appointment is scheduled . Discussed family/social support o Oldest sister died last year. She was his primary support. o Has two other sisters that were helping but, per patient, haven't called or came by very much since 02/2020.  [name] He does receive in-home care services for two hours 5 days a week o Patient would like to increase these hours o Reached out to [employer] who is the Scientist, physiological for [employer] o Confirmed that they received our request in Oct 2021 for an increase in hours and reported that  hours were increased but could not give me details o They provided me with the agency name and number and asked that I call them to confirm hours.  o I reached out to [place] by telephone at [phone] and was asked to fax a request for information regarding hours o Faxed request sent through [facility] to [phone] o Collaborated with Jamelle Haring, LPN who is physically working in the office at [facility] and advised that a faxed confirmation from [place] should be coming into the main fax line within the next few days . Discussed right flank/rib pain o Pain is improving but has not resolved o Patient had a negative CXR with rib detail at visit on 05/14/20 with Dr Livia Snellen o He was frustrated that he didn't have an answer for his pain and wasn't given any medication but advised to take OTC Tylenol or Aleve o CT scan was ordered but patient refused . Explained that further evaluation of his pain is needed in order to determine the cause of the pain since there was no injury and that a prescription pain medication may have  not been appropriate o Patient seemed to accept this explanation  o Patient then agreed to CT scan o Collaborated with [facility] Referral Coordinator and asked that she reactive the order and schedule at [facility] per patient's preference o CCM team can arrange transportation . Encouraged patient to talk with [name] regarding psychosocial issues . Encouraged patient to reach out to PCP or RNCM with any new symptoms and to call if he experiences any additional falls . Encouraged patient to keep cell phone with him at all times  Patient Self Care Activities:  . Has an aide that comes in daily to help with some ADLs . Does not drive and has assistance from sister and [name] . Takes prepackaged medications as directed . Calls PCP with any medical concerns  Please see past updates related to this goal by clicking on the "Past Updates" button in the selected goal         .  Personal Care Services      Current Barriers:  . Care Coordination needs related to [facility]) in a patient with cerebral palsy, hypertension, COPD, and frequent falls . Unable to independently drive . Lacks social connections . Unable to perform IADLs independently . Unable to independently manage his medical conditions  Nurse Case Manager Clinical Goal(s):  [name] Over the next 30 days, patient will work with Consulting civil engineer to address needs related to PCS  Interventions:  . 1:1 collaboration with [name], MD regarding development and update of comprehensive plan of care as evidenced by provider attestation and co-signature . Inter-disciplinary care team collaboration (see longitudinal plan of care) . Chart reviewed including relevant office notes, referrals, and orders for PCS . Reached out to [employer] who is the Scientist, physiological for [employer]  . Talked with [place] [phone]) in response to the faxed request [phone]) that I sent to verify patient's PCS hours o He is receiving 80 hours a month which is typically 2-3 hours per day, 7 days per week o This was increased from 55, which he was receiving prior to our request for increased hours in Oct 2021 o Hours are flexible as long as they equal 80 per month o Theadora Rama is Heritage manager current Aide. They work very well together.  o Per Librarian, academic, she is able to go with him to appointments if they are both comfortable with that arrangement and if it fits within her scheduled hours.  o Hours can be increased if PCP feels it is necessary and can send in new order with supporting documentation . RNCM will collaborate with [facility] clinical staff regarding new order for increased hours . RNCM will let patient know that Theadora Rama may be able to go with him to CT scan appointment if they both feel comfortable with that. Patient was concerned about going alone.       Plan:   The patient has  been provided with contact information for the care management team and has been advised to call with any health related questions or concerns.  The care management team will reach out to the patient again over the next 45 days.    [name], BSN, RN-BC Embedded Chronic Care Manager [facility] / [place] Management Direct Dial: [phone]

## 2020-06-11 NOTE — Patient Instructions (Signed)
Visit Information  PATIENT GOALS: Goals Addressed            This Visit's Progress   . Fall Prevention and Improved Strength and Balance   On track    Timeframe:  Short-Term Goal Priority:  High Start Date: 05/30/20                            Expected End Date: 08/27/20  Follow-up: 06/10/20                     . Attend all physical therapy sessions . Work with [employer] and [name] to arrange transportation to therapy sessions . Use cane for ambulation . Move carefully and change positions slowly in order to decrease fall risk . Use chair lift carefully since it feels loose o Have someone check the bolts/screws to see if they nee tightening    . Improve My Heart Health-Coronary Artery Disease   On track    Timeframe:  Long-Range Goal Priority:  Medium Start Date:  06/10/20                          Expected End Date:  04/19/21                     Follow Up Date 07/09/20    . I can manage, know and watch for signs of a heart attack . if I have chest pain, call for help  . Take all of your medications each day . Eat plenty of vegetables, fruits, and lean protein . Do not eat a lot of sugary, fried, or fatty foods . See your cardiologist regularly o Dr Percival Spanish on 3/30 at 3:20 in the [place] office . Call Dr Percival Spanish as needed at [phone]   Why is this important?    Lifestyle changes are key to improving the blood flow to your heart. Think about the things you can change and set a goal to live healthy.   Remember, when the blood vessels to your heart start to get clogged you may not have any symptoms.   Over time, they can get worse.   Don't ignore the signs, like chest pain, and get help right away.     Notes:     . Increase Personal Care Services Hours   Not on track    Timeframe:  Short-Term Goal Priority:  Medium Start Date:                             Expected End Date:                       Follow-up: 06/10/20  . Reach out to [facility] and [name] as needed . Work with  [employer] . Talk with PCP office regarding order for increased PCS hours    . Track and Manage My Blood Pressure-Hypertension   On track    Timeframe:  Long-Range Goal Priority:  Medium Start Date:                             Expected End Date:                       Follow Up Date 07/09/20   . Take prepackaged medication  as prescribed . Check and write down blood pressure everyday and as needed . Call your doctor if your blood pressure is over 140/90 or lower than 100/70 . Eat a low sodium diet. Do not add extra salt to food . Keep appointment with cardiologist on 07/17/20 . Move around more but be careful not to fall    Why is this important?    You won't feel high blood pressure, but it can still hurt your blood vessels.   High blood pressure can cause heart or kidney problems. It can also cause a stroke.   Making lifestyle changes like losing a little weight or eating less salt will help.   Checking your blood pressure at home and at different times of the day can help to control blood pressure.   If the doctor prescribes medicine remember to take it the way the doctor ordered.   Call the office if you cannot afford the medicine or if there are questions about it.     Notes:        Patient verbalizes understanding of instructions provided today and agrees to view in [place].   Follow Up Plan:  Telephone follow up appointment with care management team member scheduled for: 07/09/20 with RN Care Manager The patient has been provided with contact information for the care management team and has been advised to call with any health related questions or concerns.  Next cardiology appointment scheduled for: 07/17/20 at 3:20 in [facility], BSN, RN-BC [place] / [place] Management Direct Dial: [phone]

## 2020-06-11 NOTE — Chronic Care Management (AMB) (Signed)
Chronic Care Management   CCM RN Visit Note  06/10/2020 Name: [name] MRN: [id number] DOB: [birth year]  Subjective: [name] is a 65 y.o. year old male who is a primary care patient of [name], [name], MD. The care management team was consulted for assistance with disease management and care coordination needs.    Engaged with patient by telephone for follow up visit in response to provider referral for case management and/or care coordination services.   Consent to Services:  The patient was given information about Chronic Care Management services, agreed to services, and gave verbal consent prior to initiation of services.  Please see initial visit note for detailed documentation.   Patient agreed to services and verbal consent obtained.   Assessment: Review of patient past medical history, allergies, medications, health status, including review of consultants reports, laboratory and other test data, was performed as part of comprehensive evaluation and provision of chronic care management services.   SDOH (Social Determinants of Health) assessments and interventions performed:    CCM Care Plan  Allergies  Allergen Reactions  . Chantix [Varenicline Tartrate] Nausea And Vomiting    Outpatient Encounter Medications as of 06/10/2020  Medication Sig  . acetaminophen (TYLENOL) 325 MG tablet Take 2 tablets (650 mg total) by mouth every 6 (six) hours as needed for mild pain or headache.  . albuterol (PROAIR HFA) 108 (90 Base) MCG/ACT inhaler 1-2 puffs every 6 hours as needed wheezing or shortness of breath.  [name] aspirin EC 81 MG tablet Take 1 tablet (81 mg total) by mouth daily.  [name] atorvastatin (LIPITOR) 40 MG tablet TAKE 1 TABLET ONCE DAILY AT 6PM  . buPROPion (WELLBUTRIN SR) 150 MG 12 hr tablet Take 1 tablet (150 mg total) by mouth daily with breakfast.  . butalbital-acetaminophen-caffeine (ESGIC) 50-325-40 MG tablet Take 1 tablet by mouth every 6 (six) hours as needed for  headache.  . celecoxib (CELEBREX) 200 MG capsule Take 1 capsule (200 mg total) by mouth daily as needed.  . cetaphil (CETAPHIL) lotion Apply 1 application topically 2 (two) times daily. For rash. Apply after washing with [facility] shower gel and rinsing with clear warm water.  . DULoxetine (CYMBALTA) 60 MG capsule Take 1 capsule (60 mg total) by mouth daily. WITH A FULL STOMACH AT SUPPER TIME  . fexofenadine (ALLEGRA) 180 MG tablet Take 1 tablet (180 mg total) by mouth daily. For allergy symptoms  . fluocinonide-emollient (LIDEX-E) 0.05 % cream Apply 1 application topically 2 (two) times daily. To affected areas  . fluticasone (FLONASE) 50 MCG/ACT nasal spray Place 2 sprays into both nostrils daily as needed for allergies or rhinitis.  . fluticasone furoate-vilanterol (BREO ELLIPTA) 100-25 MCG/INH AEPB Inhale 1 puff into the lungs daily.  . Ipratropium-Albuterol (COMBIVENT RESPIMAT) 20-100 MCG/ACT AERS respimat Inhale 1 puff into the lungs every 6 (six) hours  . isosorbide mononitrate (IMDUR) 60 MG 24 hr tablet Take 1 tablet (60 mg total) by mouth daily.  . metoprolol tartrate (LOPRESSOR) 25 MG tablet Take 1 tablet (25 mg total) by mouth 2 (two) times daily.  . mirtazapine (REMERON) 15 MG tablet Take 1 tablet (15 mg total) by mouth at bedtime. For sleep  . nitroGLYCERIN (NITROSTAT) 0.4 MG SL tablet Place 1 tablet (0.4 mg total) under the tongue every 5 (five) minutes x 3 doses as needed for chest pain.  . pantoprazole (PROTONIX) 40 MG tablet TAKE  (1)  TABLET TWICE A DAY.  . tamsulosin (FLOMAX) 0.4 MG CAPS capsule Take  2 capsules (0.8 mg total) by mouth daily after supper.   Facility-Administered Encounter Medications as of 06/10/2020  Medication  . cyanocobalamin ((VITAMIN B-12)) injection 1,000 mcg    Patient Active Problem List   Diagnosis Date Noted  . Degenerative disc disease, lumbar 01/16/2019  . Depression   . Hypokalemia 06/27/2018  . Seizure disorder ([place]) 02/07/2018  . Apraxia  of speech 10/13/2017  . Left-sided weakness 10/13/2017  . Coronary artery disease of native artery of native heart with stable angina pectoris ([place])   . Illiteracy 10/23/2016  . Carpal tunnel syndrome of right wrist 06/15/2016  . GAD (generalized anxiety disorder) 05/01/2016  . History of MI (myocardial infarction) 02/26/2015  . Dyslipidemia, goal LDL below 70 10/08/2009  . Infantile cerebral palsy ([place]) 10/08/2009  . Essential hypertension 10/08/2009    Conditions to be addressed/monitored: Infantile Cerebral Palsy with cognitive deficits, GAD, illiteracy, apraxia of speech, angina, hx of MI, CAD, dyslipidemia,B12 deficiency, eczema, DDD, Depression, HTN, COPD   Care Plan : RNCM: Hypertension (Adult)  Updates made by [name], RN since 06/11/2020 12:00 AM    Problem: Hypertension (Hypertension)   Priority: Medium    Long-Range Goal: Hypertension Monitored   Start Date: 05/30/2020  This Visit's Progress: On track  Recent Progress: Not on track  Priority: Medium  Note:   Current Barriers:  . Chronic Disease Management support and education needs related to hypertension . Lacks caregiver support.  . Film/video editor.  . Literacy barriers . Transportation barriers . Cognitive Deficits . Unable to independently drive  Nurse Case Manager Clinical Goal(s):  [name] Patient will work with RN Care Manager to address needs related to hypertension . Patient will check and record blood pressure daily and bring record to all PCP and cardiology appointments  Interventions:  . 1:1 collaboration with [name], MD regarding development and update of comprehensive plan of care as evidenced by provider attestation and co-signature . Inter-disciplinary care team collaboration (see longitudinal plan of care) . Evaluation of current treatment plan related to hypertension and patient's adherence to plan as established by provider. . Reviewed medications o Medications are prepackaged  for the month and delivered by [facility] o Patient is compliant with medications o No problems with affordability. Has Medicaid secondary which covers medication cost. . Advised patient to check and record blood pressure daily and as needed o BP is also checked at PT appointments . Advised to call PCP with any blood pressure readings that are outside of recommended range . Bring log to PCP and cardiology appointments . Recommended DASH diet . Reviewed upcoming appointments . Discussed transportation through [place] . Encouraged patient to reach out to [facility] team as needed  Patient Goals/Self-Care Activities Over the next 30 days, patient will: . Take prepackaged medication as prescribed . Check and write down blood pressure everyday and as needed . Call your doctor if your blood pressure is over 140/90 or lower than 100/70 . Eat a low sodium diet. Do not add extra salt to food . Keep appointment with cardiologist on 07/17/20 . Move around more but be careful not to fall     Care Plan : RNCM: Cerebral Palsy  Updates made by [name], RN since 06/11/2020 12:00 AM    Problem: In-Home Care Needed   Priority: Medium    Goal: Increase Personal Care Service Hours   This Visit's Progress: Not on track  Recent Progress: Not on track  Priority: Medium  Note:   Current Barriers:  .  Care Coordination needs related to [place] [facility]) in a patient with cerebral palsy, hypertension, COPD, and frequent falls . Unable to independently drive . Lacks caregiver support . Lacks social connections . Unable to perform IADLs independently . Unable to independently manage his medical conditions  Nurse Case Manager Clinical Goal(s):  . patient will work with PCP office to address needs related to increasing Personal Care Service hours . patient will meet with RN Care Manager to address personal care services  Interventions:  . 1:1 collaboration with [name], MD  regarding development and update of comprehensive plan of care as evidenced by provider attestation and co-signature . Inter-disciplinary care team collaboration (see longitudinal plan of care) . Chart reviewed including relevant office notes, referrals, and orders for PCS . Reached out to [employer] who is the Scientist, physiological for [employer]  . Talked with [place] [phone]) in response to the faxed request [phone]) that I sent to verify patient's PCS hours o He is receiving 80 hours a month which is typically 2-3 hours per day, 7 days per week o This was increased from 55, which he was receiving prior to our request for increased hours in Oct 2021 o Hours are flexible as long as they equal 80 per month o Theadora Rama is Heritage manager current Aide. They work very well together.  o Per Librarian, academic, she is able to go with him to appointments if they are both comfortable with that arrangement and if it fits within her scheduled hours.  o Hours can be increased if PCP feels it is necessary and can send in new order with supporting documentation . RNCM will collaborate with [facility] clinical staff regarding new order for increased hours . RNCM will let patient know that Theadora Rama may be able to go with him to CT scan appointment if they both feel comfortable with that. Patient was concerned about going alone.  Patient Goals/Self-Care Activities Over the next 60 days, patient will: . Reach out to [facility] and [name] as needed . Work with [employer] . Talk with PCP office regarding order for increased PCS hours    Problem: Weakness and Falls   Priority: High    Goal: Prevent Falls by Improving Strength and Balance   Start Date: 05/30/2020  This Visit's Progress: On track  Priority: High  Note:   Current Barriers:  . Care Coordination needs related to generalized weakness and frequent falls in a patient with infantile cerebral palsy, HTN, and hx of MI . Lacks caregiver support.   . Transportation barriers . Unable to independently drive . Lacks social connections . Cognitive deficits and illiteracy   Nurse Case Manager Clinical Goal(s):  . patient will work with [facility] to address needs related to weakness and recurrent falls . patient will meet with RN Care Manager to address fall prevention  Interventions:  . 1:1 collaboration with [name], MD regarding development and update of comprehensive plan of care as evidenced by provider attestation and co-signature . Inter-disciplinary care team collaboration (see longitudinal plan of care) . Chart reviewed including relevant office notes, lab results, and referral notes . Medications reviewed . Discussed lift chair o RNCM and Care Guides have worked to try and find a way to replace his broken lift chair but have been unsuccessful. o Patient says that it feels loose and that it may just need some screws tightened. He's going to check on this.  o Discussed other chair options . Discussed referral  for Outpatient Physical therapy for gait training and strengthening to improve balance and decrease fall risk . Collaborated with [name] regarding transportation to physical therapy appointments twice a week . Encouraged patient to reach out CCM team as needed . Previously completely falls evaluation and discussed fall prevention  Patient Goals/Self-Care Activities Over the next 90 days, patient will: . Attend all physical therapy sessions . Work with [employer] and [name] to arrange transportation to therapy sessions . Use cane for ambulation . Move carefully and change positions slowly in order to decrease fall risk . Use chair lift carefully since it feels loose o Have someone check the bolts/screws to see if they nee tightening     Care Plan : RNCM:Coronary Artery Disease (Adult)  Updates made by [name], RN since 06/11/2020 12:00 AM    Problem: Disease Progression (Coronary Artery  Disease)   Priority: Medium    Long-Range Goal: Disease Progression Prevented or Minimized   Start Date: 06/10/2020  This Visit's Progress: On track  Priority: Medium  Note:   Current Barriers:  . Chronic Disease Management support and education needs related to CAD in a patient with hypertension and hx of MI . Unable to independently drive . Unable to perform IADLs independently  Nurse Case Manager Clinical Goal(s):  . patient will work with Consulting civil engineer to address needs related to self-management of CAD . patient will attend all scheduled medical appointments: Dr Percival Spanish 07/17/20 in [place] office  Interventions:  . 1:1 collaboration with [name], MD regarding development and update of comprehensive plan of care as evidenced by provider attestation and co-signature . Inter-disciplinary care team collaboration (see longitudinal plan of care) . Evaluation of current treatment plan related to CAD and patient's adherence to plan as established by provider. . Chart reviewed including relevant office notes and lab results . Reviewed and discussed appointments and appointment recalls o Overdue for 12 month follow-up with cardio . Collaborated with [place] to schedule patient appointment with Dr Percival Spanish in [place] on 07/17/20 at 3:20 o Patient notified o CCM team will need to arrange transportation if his sister cannot take him . Reviewed and discussed medications o Taking prepackaged medications as directed o Has not had to take nitro "in a while" . Reviewed s/s of a heart attack o No chest pain or pressure . Encouraged increasing physical activity as tolerated o Working with PT now to increase strength and balance . Discussed diet o Has mostly frozen meals for convenience o Reinforced need to limit fried, fatty, and sugary foods and to eat vegetables, fruits, and lean proteins . Encouraged to call Cardiologist as needed at [phone] . Reinforced to call  911 if he has any s/s of a heart attack . Encouraged to reach out to [facility] team as needed  Patient Goals/Self-Care Activities Over the next 60 days, patient will: . I can manage, know and watch for signs of a heart attack . if I have chest pain, call for help  . Take all of your medications each day . Eat plenty of vegetables, fruits, and lean protein . Do not eat a lot of sugary, fried, or fatty foods . See your cardiologist regularly o Dr Percival Spanish on 3/30 at 3:20 in the [place] office . Call Dr Percival Spanish as needed at [phone]       Follow Up Plan:  Telephone follow up appointment with care management team member scheduled for: 07/09/20 with RN Care Manager The patient has been provided  with contact information for the care management team and has been advised to call with any health related questions or concerns.  Next cardiology appointment scheduled for: 07/17/20 at 3:20 in [facility], BSN, RN-BC [place] / [place] Management Direct Dial: [phone]

## 2020-06-12 ENCOUNTER — Ambulatory Visit (INDEPENDENT_AMBULATORY_CARE_PROVIDER_SITE_OTHER): Payer: Medicare Other

## 2020-06-12 ENCOUNTER — Ambulatory Visit: Payer: Medicare Other | Admitting: Physical Therapy

## 2020-06-12 ENCOUNTER — Other Ambulatory Visit: Payer: Self-pay

## 2020-06-12 DIAGNOSIS — R296 Repeated falls: Secondary | ICD-10-CM

## 2020-06-12 DIAGNOSIS — M6281 Muscle weakness (generalized): Secondary | ICD-10-CM | POA: Diagnosis not present

## 2020-06-12 DIAGNOSIS — R2681 Unsteadiness on feet: Secondary | ICD-10-CM

## 2020-06-12 DIAGNOSIS — R262 Difficulty in walking, not elsewhere classified: Secondary | ICD-10-CM | POA: Diagnosis not present

## 2020-06-12 DIAGNOSIS — Z23 Encounter for immunization: Secondary | ICD-10-CM | POA: Diagnosis not present

## 2020-06-12 DIAGNOSIS — E538 Deficiency of other specified B group vitamins: Secondary | ICD-10-CM | POA: Diagnosis not present

## 2020-06-12 NOTE — Progress Notes (Addendum)
Vitamin b12 injection given and patient tolerated well.     Covid-19 Vaccination Clinic  Name:  [name]    MRN: [id number] DOB: November 26, 1955  06/12/2020  [name] was observed post Covid-19 immunization for 15 minutes without incident. He was provided with Vaccine Information Sheet and instruction to access the V-Safe system.   [name] was instructed to call 911 with any severe reactions post vaccine: [name] Difficulty breathing  . Swelling of face and throat  . A fast heartbeat  . A bad rash all over body  . Dizziness and weakness   Immunizations Administered    Name Date Dose VIS Date Route   Moderna COVID-19 Vaccine 06/12/2020 10:31 AM 0.5 mL 02/07/2020 Intramuscular   Manufacturer: Moderna   Lot: [id number]   [place]: [phone]

## 2020-06-12 NOTE — Therapy (Signed)
[facility] [place], [place], [address] Phone: [phone]   Fax:  [phone]  Physical Therapy Treatment  Patient Details  Name: [name] MRN: [id number] Date of Birth: [birth year] Referring Provider (PT): [name], MD   Encounter Date: 06/12/2020   PT End of Session - 06/12/20 1117    Visit Number 4    Number of Visits 12    Date for PT Re-Evaluation 07/19/20    Authorization Type [employer] healthcare medicare (CQ modifier & KX modifier) Progress note every 10th visit    PT Start Time 1115    PT Stop Time 1157    PT Time Calculation (min) 42 min    Activity Tolerance Patient tolerated treatment well    Behavior During Therapy [facility] for tasks assessed/performed           Past Medical History:  Diagnosis Date  . Cerebral palsy ([place])   . Coronary artery disease    LAD 70% stenosis, cath 2019  . Depression   . GERD (gastroesophageal reflux disease)   . Hypertension   . Scoliosis   . Seizures ([place])    pt's sister states that patinet has had seizures in the past, pt camr for PAT by himself on last visit and she stst "they misunderstood what he was trying to say. Pt saw neurologist in March who did CT and states patient does not have seizures. On no meds, had seizures as child.    Past Surgical History:  Procedure Laterality Date  . CARPAL TUNNEL RELEASE Right 08/13/2016   Procedure: CARPAL TUNNEL RELEASE;  Surgeon: Carole Civil, MD;  Location: [facility];  Service: Orthopedics;  Laterality: Right;  . CORONARY ANGIOPLASTY WITH STENT PLACEMENT    . LEFT HEART CATH AND CORONARY ANGIOGRAPHY N/A 07/10/2017   Procedure: LEFT HEART CATH AND CORONARY ANGIOGRAPHY;  Surgeon: Troy Sine, MD;  Location: [place] CV LAB;  Service: Cardiovascular;  Laterality: N/A;    There were no vitals filed for this visit.                      [place] Adult PT Treatment/Exercise - 06/12/20 0001      Lumbar  Exercises: Seated   Other Seated Lumbar Exercises seated for Core sets and oblique sets x20 each    Other Seated Lumbar Exercises seated 2# ball for truck rotations 2x10 and overhead 2x10      Knee/Hip Exercises: Aerobic   Nustep L4 x 12 min LE only      Knee/Hip Exercises: Supine   Short Arc Quad Sets Strengthening;Both;20 reps    Short Arc Quad Sets Limitations 2#    Bridges Strengthening;Both;20 reps    Other Supine Knee/Hip Exercises clamshell right LE w red band/ left LE yellow band x20 each               Balance Exercises - 06/12/20 0001      Balance Exercises: Standing   Standing Eyes Opened Narrow base of support (BOS);Wide (BOA);Foam/compliant surface   x71min each   Rockerboard Anterior/posterior   x48min   Tandem Gait Forward;Upper extremity support;5 reps;3 reps    Marching Solid surface;Upper extremity assist 1;20 reps    Heel Raises Both;20 reps    Sit to Stand Elevated surface   x15                 PT Long Term Goals - 06/12/20 1129      PT  LONG TERM GOAL #1   Title Patient will be independent with HEP    Time 6    Period Weeks    Status On-going      PT LONG TERM GOAL #2   Title Patient will demonstrate 4/5 or greater bilateral LE MMT to improve stability during functional tasks.    Time 6    Period Weeks    Status On-going      PT LONG TERM GOAL #3   Title Patient will perform modified 5x sit to stand with UE support in 22 seconds or less to decrease risk of falls.    Time 6    Period Weeks    Status On-going      PT LONG TERM GOAL #4   Title Patient will demonstrate a 4+ point improvement on the Berg Balance Scale to decrease risk of falls.    Baseline 32/56 06/05/20    Period Weeks    Status On-going                 Plan - 06/12/20 1147    Clinical Impression Statement Patient toleraed treatment well today. Patient able to continue to progress with all exercises today for balance and LE strengthening. Patient required SBA for  safety today. Patient able to perform sit to stands yet elevated surface and slow pace. Patient has LOB with some balance activities yet able to recover with assist.  Patient current goals progressing this week.    Personal Factors and Comorbidities Comorbidity 3+;Time since onset of injury/illness/exacerbation;Age    Comorbidities history of falls, HTN, CAD, Seizure disorder, infantile cerebral palsy, history of MI    Examination-Activity Limitations Stand;Squat;Hygiene/Grooming;Dressing;Toileting;Locomotion Level;Transfers;Bed Mobility;Bathing;Sit    Stability/Clinical Decision Making Evolving/Moderate complexity    Rehab Potential Fair    PT Frequency 2x / week    PT Duration 6 weeks    PT Treatment/Interventions ADLs/Self Care Home Management;Gait training;Stair training;Functional mobility training;Therapeutic activities;Therapeutic exercise;Balance training;Neuromuscular re-education;Manual techniques;Passive range of motion;Patient/family education    PT Next Visit Plan cont with POC for  Nustep, LE strengthening, core stability, balance activities in various positions.    Consulted and Agree with Plan of Care Patient           Patient will benefit from skilled therapeutic intervention in order to improve the following deficits and impairments:  Decreased activity tolerance,Decreased balance,Decreased strength,Abnormal gait,Difficulty walking,Pain,Postural dysfunction  Visit Diagnosis: Muscle weakness (generalized)  Unsteadiness on feet  Repeated falls  Difficulty in walking, not elsewhere classified     Problem List Patient Active Problem List   Diagnosis Date Noted  . Degenerative disc disease, lumbar 01/16/2019  . Depression   . Hypokalemia 06/27/2018  . Seizure disorder ([place]) 02/07/2018  . Apraxia of speech 10/13/2017  . Left-sided weakness 10/13/2017  . Coronary artery disease of native artery of native heart with stable angina pectoris ([place])   . Illiteracy  10/23/2016  . Carpal tunnel syndrome of right wrist 06/15/2016  . GAD (generalized anxiety disorder) 05/01/2016  . History of MI (myocardial infarction) 02/26/2015  . Dyslipidemia, goal LDL below 70 10/08/2009  . Infantile cerebral palsy ([place]) 10/08/2009  . Essential hypertension 10/08/2009    Phillips Climes, PTA 06/12/2020, 12:01 PM  [facility] [address] [place], [place], [address] Phone: [phone]   Fax:  [phone]  Name: [name] MRN: [id number] Date of Birth: 03-31-1956

## 2020-06-13 ENCOUNTER — Ambulatory Visit (HOSPITAL_COMMUNITY): Payer: Medicare Other

## 2020-06-14 ENCOUNTER — Ambulatory Visit: Payer: Medicare Other | Admitting: Physical Therapy

## 2020-06-14 ENCOUNTER — Other Ambulatory Visit: Payer: Self-pay

## 2020-06-14 ENCOUNTER — Encounter: Payer: Self-pay | Admitting: Physical Therapy

## 2020-06-14 DIAGNOSIS — R296 Repeated falls: Secondary | ICD-10-CM | POA: Diagnosis not present

## 2020-06-14 DIAGNOSIS — R262 Difficulty in walking, not elsewhere classified: Secondary | ICD-10-CM | POA: Diagnosis not present

## 2020-06-14 DIAGNOSIS — R2681 Unsteadiness on feet: Secondary | ICD-10-CM | POA: Diagnosis not present

## 2020-06-14 DIAGNOSIS — M6281 Muscle weakness (generalized): Secondary | ICD-10-CM | POA: Diagnosis not present

## 2020-06-14 NOTE — Therapy (Signed)
[facility] [place], [place], [address] Phone: [phone]   Fax:  [phone]  Physical Therapy Treatment  Patient Details  Name: [name] MRN: [id number] Date of Birth: 11-Apr-1956 Referring Provider (PT): [name], MD   Encounter Date: 06/14/2020   PT End of Session - 06/14/20 1214    Visit Number 5    Number of Visits 12    Date for PT Re-Evaluation 07/19/20    Authorization Type United healthcare medicare (CQ modifier & KX modifier) Progress note every 10th visit    PT Start Time 1115    PT Stop Time 1158    PT Time Calculation (min) 43 min    Activity Tolerance Patient tolerated treatment well    Behavior During Therapy [facility] for tasks assessed/performed           Past Medical History:  Diagnosis Date  . Cerebral palsy ([place])   . Coronary artery disease    LAD 70% stenosis, cath 2019  . Depression   . GERD (gastroesophageal reflux disease)   . Hypertension   . Scoliosis   . Seizures ([place])    pt's sister states that patinet has had seizures in the past, pt camr for PAT by himself on last visit and she stst "they misunderstood what he was trying to say. Pt saw neurologist in March who did CT and states patient does not have seizures. On no meds, had seizures as child.    Past Surgical History:  Procedure Laterality Date  . CARPAL TUNNEL RELEASE Right 08/13/2016   Procedure: CARPAL TUNNEL RELEASE;  Surgeon: Carole Civil, MD;  Location: [facility];  Service: Orthopedics;  Laterality: Right;  . CORONARY ANGIOPLASTY WITH STENT PLACEMENT    . LEFT HEART CATH AND CORONARY ANGIOGRAPHY N/A 07/10/2017   Procedure: LEFT HEART CATH AND CORONARY ANGIOGRAPHY;  Surgeon: Troy Sine, MD;  Location: [place] CV LAB;  Service: Cardiovascular;  Laterality: N/A;    There were no vitals filed for this visit.   Subjective Assessment - 06/14/20 1213    Subjective COVID-19 screening performed upon arrival.  Patient reported feeling sore and fatigued after last session but overall did well.    Pertinent History History of falls, HTN, CAD, Seizure disorder, infantile cerebral palsy, history of MI    Limitations Walking;House hold activities;Standing;Sitting    Patient Stated Goals stop falling    Currently in Pain? No/denies              [facility] PT Assessment - 06/14/20 0001      Assessment   Medical Diagnosis Left sided weakness, at high risk for falls    Referring Provider (PT) [name], MD                         [facility] Adult PT Treatment/Exercise - 06/14/20 0001      Lumbar Exercises: Seated   Other Seated Lumbar Exercises seated for Core sets and oblique sets x30 each    Other Seated Lumbar Exercises seated 2# ball for trunk rotations 2x10 and overhead 2x10      Knee/Hip Exercises: Aerobic   Nustep L4 x 15 min LE only      Knee/Hip Exercises: Seated   Clamshell with TheraBand Yellow   x2 minutes   Marching Strengthening;Both;2 sets;10 reps;Weights    Marching Limitations green theraband    Hamstring Curl Strengthening;Both;20 reps    Hamstring Limitations green theraband  Balance Exercises - 06/14/20 0001      Balance Exercises: Standing   Standing Eyes Opened Narrow base of support (BOS);Wide (BOA);Foam/compliant surface   x2 minutes   Balance Beam lateral stepping x2 mins    Marching Solid surface;Upper extremity assist 1;20 reps    Heel Raises Both;20 reps                  PT Long Term Goals - 06/12/20 1129      PT LONG TERM GOAL #1   Title Patient will be independent with HEP    Time 6    Period Weeks    Status On-going      PT LONG TERM GOAL #2   Title Patient will demonstrate 4/5 or greater bilateral LE MMT to improve stability during functional tasks.    Time 6    Period Weeks    Status On-going      PT LONG TERM GOAL #3   Title Patient will perform modified 5x sit to stand with UE support in 22 seconds or  less to decrease risk of falls.    Time 6    Period Weeks    Status On-going      PT LONG TERM GOAL #4   Title Patient will demonstrate a 4+ point improvement on the Berg Balance Scale to decrease risk of falls.    Baseline 32/56 06/05/20    Period Weeks    Status On-going                 Plan - 06/14/20 1214    Clinical Impression Statement Patient arrived doing very well but sore after last session. Patient was able to tolerate exercises with good technique after explanation and demonstration. Patient with close supervision for balance activities. Patient with left LE muscle fasiculations but overall was able to complete all reps with rest.    Personal Factors and Comorbidities Comorbidity 3+;Time since onset of injury/illness/exacerbation;Age    Comorbidities history of falls, HTN, CAD, Seizure disorder, infantile cerebral palsy, history of MI    Examination-Activity Limitations Stand;Squat;Hygiene/Grooming;Dressing;Toileting;Locomotion Level;Transfers;Bed Mobility;Bathing;Sit    Stability/Clinical Decision Making Evolving/Moderate complexity    Clinical Decision Making Moderate    Rehab Potential Fair    PT Frequency 2x / week    PT Duration 6 weeks    PT Treatment/Interventions ADLs/Self Care Home Management;Gait training;Stair training;Functional mobility training;Therapeutic activities;Therapeutic exercise;Balance training;Neuromuscular re-education;Manual techniques;Passive range of motion;Patient/family education    PT Next Visit Plan cont with POC for  Nustep, LE strengthening, core stability, balance activities in various positions.    PT Home Exercise Plan see patient education section    Consulted and Agree with Plan of Care Patient           Patient will benefit from skilled therapeutic intervention in order to improve the following deficits and impairments:  Decreased activity tolerance,Decreased balance,Decreased strength,Abnormal gait,Difficulty  walking,Pain,Postural dysfunction  Visit Diagnosis: Muscle weakness (generalized)  Unsteadiness on feet  Difficulty in walking, not elsewhere classified  Repeated falls     Problem List Patient Active Problem List   Diagnosis Date Noted  . Degenerative disc disease, lumbar 01/16/2019  . Depression   . Hypokalemia 06/27/2018  . Seizure disorder ([place]) 02/07/2018  . Apraxia of speech 10/13/2017  . Left-sided weakness 10/13/2017  . Coronary artery disease of native artery of native heart with stable angina pectoris ([place])   . Illiteracy 10/23/2016  . Carpal tunnel syndrome of right wrist 06/15/2016  . GAD (generalized  anxiety disorder) 05/01/2016  . History of MI (myocardial infarction) 02/26/2015  . Dyslipidemia, goal LDL below 70 10/08/2009  . Infantile cerebral palsy ([place]) 10/08/2009  . Essential hypertension 10/08/2009    Gabriela Eves, PT, DPT 06/14/2020, 12:34 PM  [facility] [address] [place], [place], [address] Phone: [phone]   Fax:  [phone]  Name: NICHLAS PITERA MRN: [id number] Date of Birth: 1955-05-30

## 2020-06-18 ENCOUNTER — Ambulatory Visit (INDEPENDENT_AMBULATORY_CARE_PROVIDER_SITE_OTHER): Payer: Medicare Other | Admitting: Licensed Clinical Social Worker

## 2020-06-18 DIAGNOSIS — I10 Essential (primary) hypertension: Secondary | ICD-10-CM

## 2020-06-18 DIAGNOSIS — R482 Apraxia: Secondary | ICD-10-CM

## 2020-06-18 DIAGNOSIS — I252 Old myocardial infarction: Secondary | ICD-10-CM

## 2020-06-18 DIAGNOSIS — M5136 Other intervertebral disc degeneration, lumbar region: Secondary | ICD-10-CM

## 2020-06-18 DIAGNOSIS — M51369 Other intervertebral disc degeneration, lumbar region without mention of lumbar back pain or lower extremity pain: Secondary | ICD-10-CM

## 2020-06-18 DIAGNOSIS — I25118 Atherosclerotic heart disease of native coronary artery with other forms of angina pectoris: Secondary | ICD-10-CM

## 2020-06-18 DIAGNOSIS — K219 Gastro-esophageal reflux disease without esophagitis: Secondary | ICD-10-CM

## 2020-06-18 DIAGNOSIS — G809 Cerebral palsy, unspecified: Secondary | ICD-10-CM

## 2020-06-18 DIAGNOSIS — J441 Chronic obstructive pulmonary disease with (acute) exacerbation: Secondary | ICD-10-CM

## 2020-06-18 DIAGNOSIS — F3341 Major depressive disorder, recurrent, in partial remission: Secondary | ICD-10-CM

## 2020-06-18 DIAGNOSIS — E538 Deficiency of other specified B group vitamins: Secondary | ICD-10-CM

## 2020-06-18 DIAGNOSIS — F411 Generalized anxiety disorder: Secondary | ICD-10-CM

## 2020-06-18 DIAGNOSIS — I209 Angina pectoris, unspecified: Secondary | ICD-10-CM

## 2020-06-18 NOTE — Patient Instructions (Addendum)
Licensed Clinical Social Worker Visit Information  Goals we discussed today:   Goals    Current Barriers:   Film/video editor.   Literacy barriers  Transportation barriers in client with Chronic Diagnoses of DDD, Depressin Apraxia of Speech, CAD GAD, Hx MI, HTN, Infantile Cerebral Palsy   Cognitive Deficits  Nurse Case Manager Clinical Goal(s):  Over the next14days, patient will have transportation arranged forhis nextB12 injection scheduled [facility]client at [facility] on July 10, 2020     Interventions:  Called client and informed client that his next B12 injection appointment at [facility] was scheduledfor3/23/2022.  Informed client that [name] would be calling [name] in next 2 weeks to make transport arrangements for client to and from 07/10/2020 appointment for client at [facility]   Talked with client about physical therapy sessions client is receiving 2 times weekly in [place], [place] with [facility] regarding nursing needs of client  Talked with client about mobility of client  Talked with client about sleeping issues of client  Talked with client about his home health aide support weekly   Patient Self Care Activities:  Currently UNABLE TO independently drive to medical appointments.  Eats meals at his home  Please see past updates related to this goal by clicking on the "Past Updates" button in the selected goal     Follow Up Plan:[name] to call client in next 4 weeks to discuss social work needs of client at that time  Materials Provided: No  The patient verbalized understanding of instructions provided today and declined a print copy of patient instruction materials.   [name].Adja Ruff MSW, [name] Licensed Clinical Social Worker [facility] Care Management [phone]

## 2020-06-18 NOTE — Chronic Care Management (AMB) (Signed)
Chronic Care Management    Clinical Social Work Follow Up Note  06/18/2020 Name: [name] MRN: [id number] DOB: [birth year]  [name] is a 65 y.o. year old male who is a primary care patient of [name], [name], MD. The CCM team was consulted for assistance with [name] .   Review of patient status, including review of consultants reports, other relevant assessments, and collaboration with appropriate care team members and the patient's provider was performed as part of comprehensive patient evaluation and provision of chronic care management services.    SDOH (Social Determinants of Health) assessments performed: No;risk of tobacco use; risk for depression; risk of stress; risk of transport needs  Flowsheet Row Chronic Care Management from 02/21/2019 in [facility]  PHQ-9 Total Score 9     GAD 7 : Generalized Anxiety Score 04/10/2019 05/01/2016 03/31/2016  Nervous, Anxious, on Edge 0 1 3  Control/stop worrying 0 1 3  Worry too much - different things 0 1 3  Trouble relaxing 0 0 2  Restless 0 0 2  Easily annoyed or irritable 0 0 0  Afraid - awful might happen 0 0 0  Total GAD 7 Score 0 3 13  Anxiety Difficulty Not difficult at all Not difficult at all Not difficult at all    Outpatient Encounter Medications as of 06/18/2020  Medication Sig  . acetaminophen (TYLENOL) 325 MG tablet Take 2 tablets (650 mg total) by mouth every 6 (six) hours as needed for mild pain or headache.  . albuterol (PROAIR HFA) 108 (90 Base) MCG/ACT inhaler 1-2 puffs every 6 hours as needed wheezing or shortness of breath.  [name] aspirin EC 81 MG tablet Take 1 tablet (81 mg total) by mouth daily.  [name] atorvastatin (LIPITOR) 40 MG tablet TAKE 1 TABLET ONCE DAILY AT 6PM  . buPROPion (WELLBUTRIN SR) 150 MG 12 hr tablet Take 1 tablet (150 mg total) by mouth daily with breakfast.  . butalbital-acetaminophen-caffeine (ESGIC) 50-325-40 MG tablet Take 1 tablet by mouth every 6 (six)  hours as needed for headache.  . celecoxib (CELEBREX) 200 MG capsule Take 1 capsule (200 mg total) by mouth daily as needed.  . cetaphil (CETAPHIL) lotion Apply 1 application topically 2 (two) times daily. For rash. Apply after washing with [facility] shower gel and rinsing with clear warm water.  . DULoxetine (CYMBALTA) 60 MG capsule Take 1 capsule (60 mg total) by mouth daily. WITH A FULL STOMACH AT SUPPER TIME  . fexofenadine (ALLEGRA) 180 MG tablet Take 1 tablet (180 mg total) by mouth daily. For allergy symptoms  . fluocinonide-emollient (LIDEX-E) 0.05 % cream Apply 1 application topically 2 (two) times daily. To affected areas  . fluticasone (FLONASE) 50 MCG/ACT nasal spray Place 2 sprays into both nostrils daily as needed for allergies or rhinitis.  . fluticasone furoate-vilanterol (BREO ELLIPTA) 100-25 MCG/INH AEPB Inhale 1 puff into the lungs daily.  . Ipratropium-Albuterol (COMBIVENT RESPIMAT) 20-100 MCG/ACT AERS respimat Inhale 1 puff into the lungs every 6 (six) hours  . isosorbide mononitrate (IMDUR) 60 MG 24 hr tablet Take 1 tablet (60 mg total) by mouth daily.  . metoprolol tartrate (LOPRESSOR) 25 MG tablet Take 1 tablet (25 mg total) by mouth 2 (two) times daily.  . mirtazapine (REMERON) 15 MG tablet Take 1 tablet (15 mg total) by mouth at bedtime. For sleep  . nitroGLYCERIN (NITROSTAT) 0.4 MG SL tablet Place 1 tablet (0.4 mg total) under the tongue every 5 (five) minutes x 3  doses as needed for chest pain.  . pantoprazole (PROTONIX) 40 MG tablet TAKE  (1)  TABLET TWICE A DAY.  . tamsulosin (FLOMAX) 0.4 MG CAPS capsule Take 2 capsules (0.8 mg total) by mouth daily after supper.   Facility-Administered Encounter Medications as of 06/18/2020  Medication  . cyanocobalamin ((VITAMIN B-12)) injection 1,000 mcg    Goals    Current Barriers:   Film/video editor.   Literacy barriers  Transportation barriers in client with Chronic Diagnoses of DDD, Depressin Apraxia of  Speech, CAD GAD, Hx MI, HTN, Infantile Cerebral Palsy   Cognitive Deficits  Nurse Case Manager Clinical Goal(s):  Over the next14days, patient will have transportation arranged forhis nextB12 injection scheduled [facility]client [facility] on July 10, 2020     Interventions:  Called client and informed client that his next B12 injection appointment at [facility] was scheduled for 07/10/2020 Informed client that [name] would be calling [name] in next 2 weeks to make transport arrangements for client to and from 07/10/2020 appointment for client at [facility]   Talked with client about physical therapy sessions client is receiving 2 times weekly in [place], [place] with [facility] regarding nursing needs of client  Talked with client about mobility of client  Talked with client about sleeping issues of client  Talked with client about his home health aide support weekly   Patient Self Care Activities:  Currently UNABLE TO independently drive to medical appointments.  Eats meals at his home  Please see past updates related to this goal by clicking on the "Past Updates" button in the selected goal     Follow Up Plan:[name] to call client in next 4 weeks to discuss social work needs of client at that time   Deshannon Hinchliffe.[place] MSW, [name] Licensed Clinical Social Worker [facility] Care Management [phone]

## 2020-06-19 ENCOUNTER — Ambulatory Visit: Payer: Medicare Other

## 2020-06-19 ENCOUNTER — Ambulatory Visit: Payer: Medicare Other | Admitting: Physical Therapy

## 2020-06-21 ENCOUNTER — Ambulatory Visit: Payer: Medicare Other | Admitting: Cardiology

## 2020-06-21 ENCOUNTER — Encounter: Payer: Medicare Other | Admitting: Physical Therapy

## 2020-06-25 ENCOUNTER — Ambulatory Visit: Payer: Medicare Other | Admitting: Family Medicine

## 2020-06-26 ENCOUNTER — Other Ambulatory Visit: Payer: Self-pay

## 2020-06-26 ENCOUNTER — Encounter: Payer: Self-pay | Admitting: Physical Therapy

## 2020-06-26 ENCOUNTER — Ambulatory Visit: Payer: Medicare Other | Attending: Family Medicine | Admitting: Physical Therapy

## 2020-06-26 DIAGNOSIS — R296 Repeated falls: Secondary | ICD-10-CM | POA: Diagnosis not present

## 2020-06-26 DIAGNOSIS — R2681 Unsteadiness on feet: Secondary | ICD-10-CM | POA: Insufficient documentation

## 2020-06-26 DIAGNOSIS — M6281 Muscle weakness (generalized): Secondary | ICD-10-CM | POA: Diagnosis not present

## 2020-06-26 DIAGNOSIS — R262 Difficulty in walking, not elsewhere classified: Secondary | ICD-10-CM | POA: Insufficient documentation

## 2020-06-26 NOTE — Therapy (Signed)
[facility] [place], [place], [address] Phone: [phone]   Fax:  [phone]  Physical Therapy Treatment  Patient Details  Name: [name] MRN: [id number] Date of Birth: Aug 23, 1955 Referring Provider (PT): [name], MD   Encounter Date: 06/26/2020   PT End of Session - 06/26/20 1127    Visit Number 6    Number of Visits 12    Date for PT Re-Evaluation 07/19/20    Authorization Type United healthcare medicare (CQ modifier & KX modifier) Progress note every 10th visit    PT Start Time 1116    PT Stop Time 1200    PT Time Calculation (min) 44 min    Activity Tolerance Patient tolerated treatment well    Behavior During Therapy [facility] for tasks assessed/performed           Past Medical History:  Diagnosis Date  . Cerebral palsy ([place])   . Coronary artery disease    LAD 70% stenosis, cath 2019  . Depression   . GERD (gastroesophageal reflux disease)   . Hypertension   . Scoliosis   . Seizures ([place])    pt's sister states that patinet has had seizures in the past, pt camr for PAT by himself on last visit and she stst "they misunderstood what he was trying to say. Pt saw neurologist in March who did CT and states patient does not have seizures. On no meds, had seizures as child.    Past Surgical History:  Procedure Laterality Date  . CARPAL TUNNEL RELEASE Right 08/13/2016   Procedure: CARPAL TUNNEL RELEASE;  Surgeon: Carole Civil, MD;  Location: [facility];  Service: Orthopedics;  Laterality: Right;  . CORONARY ANGIOPLASTY WITH STENT PLACEMENT    . LEFT HEART CATH AND CORONARY ANGIOGRAPHY N/A 07/10/2017   Procedure: LEFT HEART CATH AND CORONARY ANGIOGRAPHY;  Surgeon: Troy Sine, MD;  Location: [facility] [facility];  Service: Cardiovascular;  Laterality: N/A;    There were no vitals filed for this visit.   Subjective Assessment - 06/26/20 1120    Subjective COVID-19 screening performed upon arrival.  Patient reports his balance is doing good. Has a [employer] aide for two hours but wonders if he could get more time for assistance. No new falls in approximately two months.    Pertinent History History of falls, HTN, CAD, Seizure disorder, infantile cerebral palsy, history of MI    Limitations Walking;House hold activities;Standing;Sitting    Patient Stated Goals stop falling    Currently in Pain? No/denies              [facility] PT Assessment - 06/26/20 0001      Assessment   Medical Diagnosis Left sided weakness, at high risk for falls (P)     Referring Provider (PT) [name], MD (P)     Next MD Visit to be made (P)     Prior Therapy no (P)       Precautions   Precautions Fall (P)                          [facility] Adult PT Treatment/Exercise - 06/26/20 0001      Knee/Hip Exercises: Aerobic   Nustep L4 x 15 min LE only      Knee/Hip Exercises: Standing   Heel Raises Both;2 sets;10 reps    Hip Abduction AROM;Both;2 sets;10 reps;Knee straight      Knee/Hip Exercises: Seated   Long  [employer] Strengthening;Both;2 sets;10 reps;Weights    Long [employer] 3 lbs.    Clamshell with TheraBand Red   x20 reps; limited by L hip   Hamstring Curl Strengthening;Both;3 sets;10 reps;Limitations    Hamstring Limitations red theraband    Sit to Sand 15 reps;without UE support               Balance Exercises - 06/26/20 0001      Balance Exercises: Standing   Standing Eyes Opened Narrow base of support (BOS);Foam/compliant surface;Time;Limitations    Standing Eyes Opened Time 3 min    Standing Eyes Opened Limitations intermittant RUE support    Standing Eyes Closed Narrow base of support (BOS);Foam/compliant surface;Time;Limitations    Standing Eyes Closed Time 1 min    Standing Eyes Closed Limitations intermittant UE support    Tandem Stance Eyes open;Foam/compliant surface;Intermittent upper extremity support;Time    Tandem Stance Limitations 52min    Sidestepping  Foam/compliant support;Upper extremity support;5 reps    Marching Solid surface;Upper extremity assist 1;20 reps                  PT Long Term Goals - 06/12/20 1129      PT LONG TERM GOAL #1   Title Patient will be independent with HEP    Time 6    Period Weeks    Status On-going      PT LONG TERM GOAL #2   Title Patient will demonstrate 4/5 or greater bilateral LE MMT to improve stability during functional tasks.    Time 6    Period Weeks    Status On-going      PT LONG TERM GOAL #3   Title Patient will perform modified 5x sit to stand with UE support in 22 seconds or less to decrease risk of falls.    Time 6    Period Weeks    Status On-going      PT LONG TERM GOAL #4   Title Patient will demonstrate a 4+ point improvement on the Berg Balance Scale to decrease risk of falls.    Baseline 32/56 06/05/20    Period Weeks    Status On-going                 Plan - 06/26/20 1208    Clinical Impression Statement Patient presented in clinic with reports of no new falls. Muscle fasiculations noted in LLE with seated therex along with limited L hip mobility. Trunk rotation utilized for standing L hip abduction and sitting clamshells. Patient fatigues with therex but able to tolerate without any adverse reports. Patient does have a [employer] aide that comes to his home daily but for only two hours. Patient has inquired with PCP about increasing the hours she is there to assist with his ADLs but has not heard any updates. Patient able to tolerate uneven surfaces fair with increased ankle strategy especially with eyes closed and with fatigue. Patient does have history of 5 fractures in LLE from a fall on ice two years ago per patient.    Personal Factors and Comorbidities Comorbidity 3+;Time since onset of injury/illness/exacerbation;Age    Comorbidities history of falls, HTN, CAD, Seizure disorder, infantile cerebral palsy, history of MI    Examination-Activity Limitations  Stand;Squat;Hygiene/Grooming;Dressing;Toileting;Locomotion Level;Transfers;Bed Mobility;Bathing;Sit    Stability/Clinical Decision Making Evolving/Moderate complexity    Rehab Potential Fair    PT Frequency 2x / week    PT Duration 6 weeks    PT Treatment/Interventions ADLs/Self Care  Home Management;Gait training;Stair training;Functional mobility training;Therapeutic activities;Therapeutic exercise;Balance training;Neuromuscular re-education;Manual techniques;Passive range of motion;Patient/family education    PT Next Visit Plan cont with POC for  Nustep, LE strengthening, core stability, balance activities in various positions.    PT Home Exercise Plan see patient education section    Consulted and Agree with Plan of Care Patient           Patient will benefit from skilled therapeutic intervention in order to improve the following deficits and impairments:  Decreased activity tolerance,Decreased balance,Decreased strength,Abnormal gait,Difficulty walking,Pain,Postural dysfunction  Visit Diagnosis: Muscle weakness (generalized)  Unsteadiness on feet  Difficulty in walking, not elsewhere classified  Repeated falls     Problem List Patient Active Problem List   Diagnosis Date Noted  . Degenerative disc disease, lumbar 01/16/2019  . Depression   . Hypokalemia 06/27/2018  . Seizure disorder ([place]) 02/07/2018  . Apraxia of speech 10/13/2017  . Left-sided weakness 10/13/2017  . Coronary artery disease of native artery of native heart with stable angina pectoris ([place])   . Illiteracy 10/23/2016  . Carpal tunnel syndrome of right wrist 06/15/2016  . GAD (generalized anxiety disorder) 05/01/2016  . History of MI (myocardial infarction) 02/26/2015  . Dyslipidemia, goal LDL below 70 10/08/2009  . Infantile cerebral palsy ([place]) 10/08/2009  . Essential hypertension 10/08/2009    Standley Brooking, PTA 06/26/2020, 12:14 PM  [facility] [address] [place], [place], [address] Phone: [phone]   Fax:  [phone]  Name: UDELL MAZZOCCO MRN: [id number] Date of Birth: 1955-06-10

## 2020-06-28 ENCOUNTER — Other Ambulatory Visit: Payer: Self-pay

## 2020-06-28 ENCOUNTER — Ambulatory Visit: Payer: Medicare Other | Admitting: Physical Therapy

## 2020-06-28 ENCOUNTER — Encounter: Payer: Self-pay | Admitting: Physical Therapy

## 2020-06-28 DIAGNOSIS — R2681 Unsteadiness on feet: Secondary | ICD-10-CM | POA: Diagnosis not present

## 2020-06-28 DIAGNOSIS — R296 Repeated falls: Secondary | ICD-10-CM | POA: Diagnosis not present

## 2020-06-28 DIAGNOSIS — M6281 Muscle weakness (generalized): Secondary | ICD-10-CM

## 2020-06-28 DIAGNOSIS — R262 Difficulty in walking, not elsewhere classified: Secondary | ICD-10-CM | POA: Diagnosis not present

## 2020-06-28 NOTE — Therapy (Signed)
[facility] [place], [place], [address] Phone: [phone]   Fax:  [phone]  Physical Therapy Treatment  Patient Details  Name: [name] MRN: [id number] Date of Birth: 10/06/1955 Referring Provider (PT): [name], MD   Encounter Date: 06/28/2020   PT End of Session - 06/28/20 1124    Visit Number 7    Number of Visits 12    Date for PT Re-Evaluation 07/19/20    Authorization Type United healthcare medicare (CQ modifier & KX modifier) Progress note every 10th visit    PT Start Time 1116    PT Stop Time 1159    PT Time Calculation (min) 43 min    Activity Tolerance Patient tolerated treatment well    Behavior During Therapy [facility] for tasks assessed/performed           Past Medical History:  Diagnosis Date  . Cerebral palsy ([place])   . Coronary artery disease    LAD 70% stenosis, cath 2019  . Depression   . GERD (gastroesophageal reflux disease)   . Hypertension   . Scoliosis   . Seizures ([place])    pt's sister states that patinet has had seizures in the past, pt camr for PAT by himself on last visit and she stst "they misunderstood what he was trying to say. Pt saw neurologist in March who did CT and states patient does not have seizures. On no meds, had seizures as child.    Past Surgical History:  Procedure Laterality Date  . CARPAL TUNNEL RELEASE Right 08/13/2016   Procedure: CARPAL TUNNEL RELEASE;  Surgeon: Carole Civil, MD;  Location: [facility];  Service: Orthopedics;  Laterality: Right;  . CORONARY ANGIOPLASTY WITH STENT PLACEMENT    . LEFT HEART CATH AND CORONARY ANGIOGRAPHY N/A 07/10/2017   Procedure: LEFT HEART CATH AND CORONARY ANGIOGRAPHY;  Surgeon: Troy Sine, MD;  Location: [place] [facility];  Service: Cardiovascular;  Laterality: N/A;    There were no vitals filed for this visit.   Subjective Assessment - 06/28/20 1124    Subjective COVID-19 screening performed upon arrival. No  new complaints today.    Pertinent History History of falls, HTN, CAD, Seizure disorder, infantile cerebral palsy, history of MI    Limitations Walking;House hold activities;Standing;Sitting    Patient Stated Goals stop falling    Currently in Pain? No/denies              [facility] PT Assessment - 06/28/20 0001      Assessment   Medical Diagnosis Left sided weakness, at high risk for falls    Referring Provider (PT) [name], MD    Next MD Visit to be made    Prior Therapy no      Precautions   Precautions Fall      Restrictions   Weight Bearing Restrictions No                         [facility] Adult PT Treatment/Exercise - 06/28/20 0001      Knee/Hip Exercises: Aerobic   Nustep L4 x 15 min LE only      Knee/Hip Exercises: Standing   Lateral Step Up Both;15 reps;Hand Hold: 2;Step Height: 6"    Forward Step Up Both;15 reps;Hand Hold: 2;Step Height: 6"      Knee/Hip Exercises: Seated   Long Arc Quad Strengthening;Both;2 sets;10 reps;Weights    Long Arc Quad Weight 3 lbs.    Hamstring  Curl Strengthening;Both;3 sets;10 reps;Limitations    Hamstring Limitations green theraband    Sit to Sand 20 reps;without UE support               Balance Exercises - 06/28/20 0001      Balance Exercises: Standing   Tandem Stance Eyes open;Foam/compliant surface;Time    Tandem Stance Limitations 2 min    Standing, One Foot on a Step Eyes open;6 inch;Time    Standing, One Foot on a Step Time x2 min each LE    Sidestepping Foam/compliant support;Upper extremity support;5 reps                  PT Long Term Goals - 06/12/20 1129      PT LONG TERM GOAL #1   Title Patient will be independent with HEP    Time 6    Period Weeks    Status On-going      PT LONG TERM GOAL #2   Title Patient will demonstrate 4/5 or greater bilateral LE MMT to improve stability during functional tasks.    Time 6    Period Weeks    Status On-going      PT LONG TERM GOAL #3    Title Patient will perform modified 5x sit to stand with UE support in 22 seconds or less to decrease risk of falls.    Time 6    Period Weeks    Status On-going      PT LONG TERM GOAL #4   Title Patient will demonstrate a 4+ point improvement on the Berg Balance Scale to decrease risk of falls.    Baseline 32/56 06/05/20    Period Weeks    Status On-going                 Plan - 06/28/20 1211    Clinical Impression Statement Patient presented in clinic with reports of any new falls. Patient demonstrated less muscle fasciculations of LLE and notes improvement of LE strength. Patient continues to demonstrate instability on uneven surfaces and requires greater UE support intermittantly.    Personal Factors and Comorbidities Comorbidity 3+;Time since onset of injury/illness/exacerbation;Age    Comorbidities history of falls, HTN, CAD, Seizure disorder, infantile cerebral palsy, history of MI    Examination-Activity Limitations Stand;Squat;Hygiene/Grooming;Dressing;Toileting;Locomotion Level;Transfers;Bed Mobility;Bathing;Sit    Stability/Clinical Decision Making Evolving/Moderate complexity    Rehab Potential Fair    PT Frequency 2x / week    PT Duration 6 weeks    PT Treatment/Interventions ADLs/Self Care Home Management;Gait training;Stair training;Functional mobility training;Therapeutic activities;Therapeutic exercise;Balance training;Neuromuscular re-education;Manual techniques;Passive range of motion;Patient/family education    PT Next Visit Plan cont with POC for  Nustep, LE strengthening, core stability, balance activities in various positions.    PT Home Exercise Plan see patient education section    Consulted and Agree with Plan of Care Patient           Patient will benefit from skilled therapeutic intervention in order to improve the following deficits and impairments:  Decreased activity tolerance,Decreased balance,Decreased strength,Abnormal gait,Difficulty  walking,Pain,Postural dysfunction  Visit Diagnosis: Muscle weakness (generalized)  Unsteadiness on feet  Difficulty in walking, not elsewhere classified  Repeated falls     Problem List Patient Active Problem List   Diagnosis Date Noted  . Degenerative disc disease, lumbar 01/16/2019  . Depression   . Hypokalemia 06/27/2018  . Seizure disorder ([place]) 02/07/2018  . Apraxia of speech 10/13/2017  . Left-sided weakness 10/13/2017  . Coronary artery disease of  native artery of native heart with stable angina pectoris ([place])   . Illiteracy 10/23/2016  . Carpal tunnel syndrome of right wrist 06/15/2016  . GAD (generalized anxiety disorder) 05/01/2016  . History of MI (myocardial infarction) 02/26/2015  . Dyslipidemia, goal LDL below 70 10/08/2009  . Infantile cerebral palsy ([place]) 10/08/2009  . Essential hypertension 10/08/2009    Standley Brooking, PTA 06/28/2020, 12:24 PM  [facility] [address] [place], [place], [address] Phone: [phone]   Fax:  [phone]  Name: [name] MRN: [id number] Date of Birth: 1955/08/20

## 2020-07-02 ENCOUNTER — Ambulatory Visit: Payer: Self-pay | Admitting: Licensed Clinical Social Worker

## 2020-07-02 DIAGNOSIS — K219 Gastro-esophageal reflux disease without esophagitis: Secondary | ICD-10-CM

## 2020-07-02 DIAGNOSIS — F411 Generalized anxiety disorder: Secondary | ICD-10-CM

## 2020-07-02 DIAGNOSIS — J441 Chronic obstructive pulmonary disease with (acute) exacerbation: Secondary | ICD-10-CM

## 2020-07-02 DIAGNOSIS — I252 Old myocardial infarction: Secondary | ICD-10-CM

## 2020-07-02 DIAGNOSIS — I10 Essential (primary) hypertension: Secondary | ICD-10-CM

## 2020-07-02 DIAGNOSIS — G809 Cerebral palsy, unspecified: Secondary | ICD-10-CM

## 2020-07-02 DIAGNOSIS — M5136 Other intervertebral disc degeneration, lumbar region: Secondary | ICD-10-CM

## 2020-07-02 DIAGNOSIS — I25118 Atherosclerotic heart disease of native coronary artery with other forms of angina pectoris: Secondary | ICD-10-CM

## 2020-07-02 DIAGNOSIS — I209 Angina pectoris, unspecified: Secondary | ICD-10-CM

## 2020-07-02 DIAGNOSIS — E538 Deficiency of other specified B group vitamins: Secondary | ICD-10-CM

## 2020-07-02 DIAGNOSIS — F3341 Major depressive disorder, recurrent, in partial remission: Secondary | ICD-10-CM

## 2020-07-02 DIAGNOSIS — R482 Apraxia: Secondary | ICD-10-CM

## 2020-07-02 DIAGNOSIS — M51369 Other intervertebral disc degeneration, lumbar region without mention of lumbar back pain or lower extremity pain: Secondary | ICD-10-CM

## 2020-07-02 NOTE — Patient Instructions (Signed)
Visit Information  Chronic Care Management   Clinical Social Work Note   07/02/2020   Name: [name] MRN: [id number] DOB: [birth year]   [name] is a 65 y.o. year old male who is a primary care patient of [name], [name], MD. The CCM team was consulted to assist the patient with chronic disease management and/or care coordination needs related to: [name] .   Engaged with patient by telephone for follow up visit in response to provider referral for social work chronic care management and care coordination services.   Consent to Services:  The patient was given information about Chronic Care Management services, agreed to services, and gave verbal consent prior to initiation of services. Please see initial visit note for detailed documentation.   Patient agreed to services and consent obtained.   Assessment: Review of patient past medical history, allergies, medications, and health status, including review of relevant consultants reports was performed today as part of a comprehensive evaluation and provision of chronic care management and care coordination services.   SDOH (Social Determinants of Health) assessments and interventions performed:   Advanced Directives Status: See Vynca application for related entries.       Allergies  Allergen Reactions  . Chantix [Varenicline Tartrate] Nausea And Vomiting       Outpatient Encounter Medications as of 07/02/2020  Medication Sig  . acetaminophen (TYLENOL) 325 MG tablet Take 2 tablets (650 mg total) by mouth every 6 (six) hours as needed for mild pain or headache.  . albuterol (PROAIR HFA) 108 (90 Base) MCG/ACT inhaler 1-2 puffs every 6 hours as needed wheezing or shortness of breath.  [name] aspirin EC 81 MG tablet Take 1 tablet (81 mg total) by mouth daily.  [name] atorvastatin (LIPITOR) 40 MG tablet TAKE 1 TABLET ONCE DAILY AT 6PM  . buPROPion (WELLBUTRIN SR) 150 MG 12 hr tablet Take 1 tablet (150 mg total) by mouth daily  with breakfast.  . butalbital-acetaminophen-caffeine (ESGIC) 50-325-40 MG tablet Take 1 tablet by mouth every 6 (six) hours as needed for headache.  . celecoxib (CELEBREX) 200 MG capsule Take 1 capsule (200 mg total) by mouth daily as needed.  . cetaphil (CETAPHIL) lotion Apply 1 application topically 2 (two) times daily. For rash. Apply after washing with [facility] shower gel and rinsing with clear warm water.  . DULoxetine (CYMBALTA) 60 MG capsule Take 1 capsule (60 mg total) by mouth daily. WITH A FULL STOMACH AT SUPPER TIME  . fexofenadine (ALLEGRA) 180 MG tablet Take 1 tablet (180 mg total) by mouth daily. For allergy symptoms  . fluocinonide-emollient (LIDEX-E) 0.05 % cream Apply 1 application topically 2 (two) times daily. To affected areas  . fluticasone (FLONASE) 50 MCG/ACT nasal spray Place 2 sprays into both nostrils daily as needed for allergies or rhinitis.  . fluticasone furoate-vilanterol (BREO ELLIPTA) 100-25 MCG/INH AEPB Inhale 1 puff into the lungs daily.  . Ipratropium-Albuterol (COMBIVENT RESPIMAT) 20-100 MCG/ACT AERS respimat Inhale 1 puff into the lungs every 6 (six) hours  . isosorbide mononitrate (IMDUR) 60 MG 24 hr tablet Take 1 tablet (60 mg total) by mouth daily.  . metoprolol tartrate (LOPRESSOR) 25 MG tablet Take 1 tablet (25 mg total) by mouth 2 (two) times daily.  . mirtazapine (REMERON) 15 MG tablet Take 1 tablet (15 mg total) by mouth at bedtime. For sleep  . nitroGLYCERIN (NITROSTAT) 0.4 MG SL tablet Place 1 tablet (0.4 mg total) under the tongue every 5 (five) minutes x 3 doses as  needed for chest pain.  . pantoprazole (PROTONIX) 40 MG tablet TAKE (1) TABLET TWICE A DAY.  . tamsulosin (FLOMAX) 0.4 MG CAPS capsule Take 2 capsules (0.8 mg total) by mouth daily after supper.      Facility-Administered Encounter Medications as of 07/02/2020  Medication  . cyanocobalamin ((VITAMIN B-12)) injection 1,000 mcg       Patient Active Problem List   Diagnosis Date  Noted  . Degenerative disc disease, lumbar 01/16/2019  . Depression   . Hypokalemia 06/27/2018  . Seizure disorder ([place]) 02/07/2018  . Apraxia of speech 10/13/2017  . Left-sided weakness 10/13/2017  . Coronary artery disease of native artery of native heart with stable angina pectoris ([place])   . Illiteracy 10/23/2016  . Carpal tunnel syndrome of right wrist 06/15/2016  . GAD (generalized anxiety disorder) 05/01/2016  . History of MI (myocardial infarction) 02/26/2015  . Dyslipidemia, goal LDL below 70 10/08/2009  . Infantile cerebral palsy ([place]) 10/08/2009  . Essential hypertension 10/08/2009   Conditions to be addressed/monitored: ; Transport needs, mobility issues   [name] called [name] schedulling today and scheduled [name] transport for client to and from 07/10/2020 10:30 AM appointment for client at [facility]. [name] called client today and informed client of above information. Client and [name] spoke of physical therapy sessions received by client. [name] and client spoke of mobility of client and appetite of client. [name] and client spoke of pain issues of client. [name] and client spoke of client sleeping issues. Client and [name] spoke of ambulation of client. Client said he is walking well. [name] encouraged client to call RNCM as needed for nursing support issues. [name] thanked client for phone call with [name] on 07/02/2020   Patient Goals: Attend scheduled medical appointments Take medications as prescribed Communicate with RNCM or [name] as needed for CCM support Get adequate sleep and take rest breaks as needed Cooperate with CNA providing in home care support for client  Follow Up Plan: [name] to call client in next 4 weeks to assess client needs at that time.   [name].Aslin Farinas MSW, [name] Licensed Clinical Social Worker [facility] Care Management [phone]

## 2020-07-02 NOTE — Chronic Care Management (AMB) (Signed)
Chronic Care Management    Clinical Social Work Note  07/02/2020 Name: [name] MRN: [id number] DOB: 1956/01/29  TREYDON HENRICKS is a 65 y.o. year old male who is a primary care patient of [name], [name], MD. The CCM team was consulted to assist the patient with chronic disease management and/or care coordination needs related to: [name] .   Engaged with patient by telephone for follow up visit in response to provider referral for social work chronic care management and care coordination services.   Consent to Services:  The patient was given information about Chronic Care Management services, agreed to services, and gave verbal consent prior to initiation of services.  Please see initial visit note for detailed documentation.   Patient agreed to services and consent obtained.   Assessment: Review of patient past medical history, allergies, medications, and health status, including review of relevant consultants reports was performed today as part of a comprehensive evaluation and provision of chronic care management and care coordination services.     SDOH (Social Determinants of Health) assessments and interventions performed:    Advanced Directives Status: See Vynca application for related entries.  CCM Care Plan  Allergies  Allergen Reactions  . Chantix [Varenicline Tartrate] Nausea And Vomiting    Outpatient Encounter Medications as of 07/02/2020  Medication Sig  . acetaminophen (TYLENOL) 325 MG tablet Take 2 tablets (650 mg total) by mouth every 6 (six) hours as needed for mild pain or headache.  . albuterol (PROAIR HFA) 108 (90 Base) MCG/ACT inhaler 1-2 puffs every 6 hours as needed wheezing or shortness of breath.  [name] aspirin EC 81 MG tablet Take 1 tablet (81 mg total) by mouth daily.  [name] atorvastatin (LIPITOR) 40 MG tablet TAKE 1 TABLET ONCE DAILY AT 6PM  . buPROPion (WELLBUTRIN SR) 150 MG 12 hr tablet Take 1 tablet (150 mg total) by mouth daily with  breakfast.  . butalbital-acetaminophen-caffeine (ESGIC) 50-325-40 MG tablet Take 1 tablet by mouth every 6 (six) hours as needed for headache.  . celecoxib (CELEBREX) 200 MG capsule Take 1 capsule (200 mg total) by mouth daily as needed.  . cetaphil (CETAPHIL) lotion Apply 1 application topically 2 (two) times daily. For rash. Apply after washing with [facility] shower gel and rinsing with clear warm water.  . DULoxetine (CYMBALTA) 60 MG capsule Take 1 capsule (60 mg total) by mouth daily. WITH A FULL STOMACH AT SUPPER TIME  . fexofenadine (ALLEGRA) 180 MG tablet Take 1 tablet (180 mg total) by mouth daily. For allergy symptoms  . fluocinonide-emollient (LIDEX-E) 0.05 % cream Apply 1 application topically 2 (two) times daily. To affected areas  . fluticasone (FLONASE) 50 MCG/ACT nasal spray Place 2 sprays into both nostrils daily as needed for allergies or rhinitis.  . fluticasone furoate-vilanterol (BREO ELLIPTA) 100-25 MCG/INH AEPB Inhale 1 puff into the lungs daily.  . Ipratropium-Albuterol (COMBIVENT RESPIMAT) 20-100 MCG/ACT AERS respimat Inhale 1 puff into the lungs every 6 (six) hours  . isosorbide mononitrate (IMDUR) 60 MG 24 hr tablet Take 1 tablet (60 mg total) by mouth daily.  . metoprolol tartrate (LOPRESSOR) 25 MG tablet Take 1 tablet (25 mg total) by mouth 2 (two) times daily.  . mirtazapine (REMERON) 15 MG tablet Take 1 tablet (15 mg total) by mouth at bedtime. For sleep  . nitroGLYCERIN (NITROSTAT) 0.4 MG SL tablet Place 1 tablet (0.4 mg total) under the tongue every 5 (five) minutes x 3 doses as needed for chest pain.  [name]  pantoprazole (PROTONIX) 40 MG tablet TAKE  (1)  TABLET TWICE A DAY.  . tamsulosin (FLOMAX) 0.4 MG CAPS capsule Take 2 capsules (0.8 mg total) by mouth daily after supper.   Facility-Administered Encounter Medications as of 07/02/2020  Medication  . cyanocobalamin ((VITAMIN B-12)) injection 1,000 mcg    Patient Active Problem List   Diagnosis Date Noted  .  Degenerative disc disease, lumbar 01/16/2019  . Depression   . Hypokalemia 06/27/2018  . Seizure disorder ([place]) 02/07/2018  . Apraxia of speech 10/13/2017  . Left-sided weakness 10/13/2017  . Coronary artery disease of native artery of native heart with stable angina pectoris ([place])   . Illiteracy 10/23/2016  . Carpal tunnel syndrome of right wrist 06/15/2016  . GAD (generalized anxiety disorder) 05/01/2016  . History of MI (myocardial infarction) 02/26/2015  . Dyslipidemia, goal LDL below 70 10/08/2009  . Infantile cerebral palsy ([place]) 10/08/2009  . Essential hypertension 10/08/2009    Conditions to be addressed/monitored: ; Transport needs, mobility issues   [name] called [name] schedulling today and scheduled [name] transport for client to and from 07/10/2020 10:30 AM appointment for client at [facility].  [name] called client today and informed client of above information.   Client and [name] spoke of physical therapy sessions received by client. [name] and client spoke of mobility of client and appetite of client. [name] and client spoke of pain issues of client. [name] and client spoke of client sleeping issues. Client and [name] spoke of ambulation of client. Client said he is walking well. [name] encouraged client to call RNCM as needed for nursing support issues. [name] thanked client for phone call with [name] on 07/02/2020   .Follow Up Plan: [name] to call client in next 4 weeks to assess client needs at that time.  [name].Aleecia Tapia MSW, [name] Licensed Clinical Social Worker [facility] Care Management [phone]

## 2020-07-03 ENCOUNTER — Ambulatory Visit: Payer: Medicare Other | Admitting: Physical Therapy

## 2020-07-05 ENCOUNTER — Encounter: Payer: Medicare Other | Admitting: Physical Therapy

## 2020-07-09 ENCOUNTER — Telehealth: Payer: Medicare Other

## 2020-07-10 ENCOUNTER — Other Ambulatory Visit: Payer: Self-pay

## 2020-07-10 ENCOUNTER — Ambulatory Visit (INDEPENDENT_AMBULATORY_CARE_PROVIDER_SITE_OTHER): Payer: Medicare Other | Admitting: Family Medicine

## 2020-07-10 DIAGNOSIS — E538 Deficiency of other specified B group vitamins: Secondary | ICD-10-CM

## 2020-07-12 ENCOUNTER — Ambulatory Visit: Payer: Medicare Other | Admitting: *Deleted

## 2020-07-12 DIAGNOSIS — I10 Essential (primary) hypertension: Secondary | ICD-10-CM

## 2020-07-12 DIAGNOSIS — F3341 Major depressive disorder, recurrent, in partial remission: Secondary | ICD-10-CM | POA: Diagnosis not present

## 2020-07-12 DIAGNOSIS — J441 Chronic obstructive pulmonary disease with (acute) exacerbation: Secondary | ICD-10-CM

## 2020-07-12 DIAGNOSIS — I252 Old myocardial infarction: Secondary | ICD-10-CM

## 2020-07-12 DIAGNOSIS — I209 Angina pectoris, unspecified: Secondary | ICD-10-CM

## 2020-07-12 DIAGNOSIS — I25118 Atherosclerotic heart disease of native coronary artery with other forms of angina pectoris: Secondary | ICD-10-CM

## 2020-07-12 DIAGNOSIS — R531 Weakness: Secondary | ICD-10-CM

## 2020-07-12 DIAGNOSIS — G809 Cerebral palsy, unspecified: Secondary | ICD-10-CM

## 2020-07-16 NOTE — Progress Notes (Signed)
Cardiology Office Note   Date:  07/17/2020   ID:  James, Hale 07/02/1955, MRN [id number]  PCP:  James Fraise, MD  Cardiologist:   James Breeding, MD   Chief Complaint  Patient presents with  . Fatigue      History of Present Illness: [name] is a 65 y.o. male who presents for follow up of CAD.  The patient has a history of CAD. He has has had a remote LHC that showed 30% LAD and 70% diagonal lesion. He has reported having stents in the past, although this was felt to be possibly incorrect per prior cardiology evaluation.  He had chest pain and had cath in 2019.  The patient underwent LHC that showeda 70% LAD stenosis with other nonobstructive CAD and normal LV gram; no culprit lesion was identified.  He was managed medically.    Since we last did a tele visit with him in Feb 2021 he has not had any chest discomfort.  However, he is fatigued.  He says he is fatigued when he wakes up in the morning.  He is just tired all the day and does not feel like doing things.  He is not describing chest pressure, neck or arm discomfort.  Is not describing palpitations, presyncope or syncope.  He has no PND or orthopnea.  There is nobo11dy there to know whether he snores or has any apneic episodes.  He has never been diagnosed with this.  Past Medical History:  Diagnosis Date  . Cerebral palsy ([place])   . Coronary artery disease    LAD 70% stenosis, cath 2019  . Depression   . GERD (gastroesophageal reflux disease)   . Hypertension   . Scoliosis   . Seizures ([place])    pt's sister states that patinet has had seizures in the past, pt camr for PAT by himself on last visit and she stst "they misunderstood what he was trying to say. Pt saw neurologist in March who did CT and states patient does not have seizures. On no meds, had seizures as child.    Past Surgical History:  Procedure Laterality Date  . CARPAL TUNNEL RELEASE Right 08/13/2016   Procedure: CARPAL TUNNEL RELEASE;   Surgeon: Carole Civil, MD;  Location: [facility];  Service: Orthopedics;  Laterality: Right;  . CORONARY ANGIOPLASTY WITH STENT PLACEMENT    . LEFT HEART CATH AND CORONARY ANGIOGRAPHY N/A 07/10/2017   Procedure: LEFT HEART CATH AND CORONARY ANGIOGRAPHY;  Surgeon: Troy Sine, MD;  Location: [facility];  Service: Cardiovascular;  Laterality: N/A;     Current Outpatient Medications  Medication Sig Dispense Refill  . acetaminophen (TYLENOL) 325 MG tablet Take 2 tablets (650 mg total) by mouth every 6 (six) hours as needed for mild pain or headache.    . albuterol (PROAIR HFA) 108 (90 Base) MCG/ACT inhaler 1-2 puffs every 6 hours as needed wheezing or shortness of breath. 17 g 10  . aspirin EC 81 MG tablet Take 1 tablet (81 mg total) by mouth daily. 30 tablet 11  . atorvastatin (LIPITOR) 40 MG tablet TAKE 1 TABLET ONCE DAILY AT 6PM 90 tablet 1  . buPROPion (WELLBUTRIN SR) 150 MG 12 hr tablet Take 1 tablet (150 mg total) by mouth daily with breakfast. 90 tablet 1  . butalbital-acetaminophen-caffeine (ESGIC) 50-325-40 MG tablet Take 1 tablet by mouth every 6 (six) hours as needed for headache. 30 tablet 0  . celecoxib (CELEBREX) 200 MG capsule  Take 1 capsule (200 mg total) by mouth daily as needed. 90 capsule 1  . cetaphil (CETAPHIL) lotion Apply 1 application topically 2 (two) times daily. For rash. Apply after washing with [facility] shower gel and rinsing with clear warm water. 473 mL 0  . DULoxetine (CYMBALTA) 60 MG capsule Take 1 capsule (60 mg total) by mouth daily. WITH A FULL STOMACH AT SUPPER TIME 90 capsule 1  . fexofenadine (ALLEGRA) 180 MG tablet Take 1 tablet (180 mg total) by mouth daily. For allergy symptoms 90 tablet 2  . fluocinonide-emollient (LIDEX-E) 0.05 % cream Apply 1 application topically 2 (two) times daily. To affected areas 60 g 5  . fluticasone (FLONASE) 50 MCG/ACT nasal spray Place 2 sprays into both nostrils daily as needed for allergies or rhinitis. 48 g 1   . fluticasone furoate-vilanterol (BREO ELLIPTA) 100-25 MCG/INH AEPB Inhale 1 puff into the lungs daily. 90 each 3  . Ipratropium-Albuterol (COMBIVENT RESPIMAT) 20-100 MCG/ACT AERS respimat Inhale 1 puff into the lungs every 6 (six) hours 4 g 11  . isosorbide mononitrate (IMDUR) 60 MG 24 hr tablet Take 1 tablet (60 mg total) by mouth daily. 90 tablet 3  . metoprolol tartrate (LOPRESSOR) 25 MG tablet Take 1 tablet (25 mg total) by mouth 2 (two) times daily. 180 tablet 3  . mirtazapine (REMERON) 15 MG tablet Take 1 tablet (15 mg total) by mouth at bedtime. For sleep 90 tablet 3  . nitroGLYCERIN (NITROSTAT) 0.4 MG SL tablet Place 1 tablet (0.4 mg total) under the tongue every 5 (five) minutes x 3 doses as needed for chest pain. 25 tablet 11  . pantoprazole (PROTONIX) 40 MG tablet TAKE  (1)  TABLET TWICE A DAY. 180 tablet 3  . tamsulosin (FLOMAX) 0.4 MG CAPS capsule Take 2 capsules (0.8 mg total) by mouth daily after supper. 180 capsule 3   Current Facility-Administered Medications  Medication Dose Route Frequency Provider Last Rate Last Admin  . cyanocobalamin ((VITAMIN B-12)) injection 1,000 mcg  1,000 mcg Intramuscular Q30 days James Fraise, MD   1,000 mcg at 07/10/20 1001    Allergies:   Chantix [varenicline tartrate]    ROS:  Please see the history of present illness.   Otherwise, review of systems are positive for none.   All other systems are reviewed and negative.    PHYSICAL EXAM: VS:  BP 118/78   Pulse 79   Ht 6\' 1"  (1.854 m)   Wt 258 lb (117 kg)   BMI 34.04 kg/m  , BMI Body mass index is 34.04 kg/m. GENERAL:  Well appearing NECK:  No jugular venous distention, waveform within normal limits, carotid upstroke brisk and symmetric, no bruits, no thyromegaly LUNGS:  Clear to auscultation bilaterally CHEST:  Unremarkable HEART:  PMI not displaced or sustained,S1 and S2 within normal limits, no S3, no S4, no clicks, no rubs, no murmurs ABD:  Flat, positive bowel sounds normal in  frequency in pitch, no bruits, no rebound, no guarding, no midline pulsatile mass, no hepatomegaly, no splenomegaly EXT:  2 plus pulses throughout, no edema, no cyanosis no clubbing    EKG:  EKG is ordered today. The ekg ordered today demonstrates rhythm, rate 79, axis within normal limits, intervals within normal limits, no acute ST-T wave changes.   Recent Labs: 05/09/2020: ALT 17; BUN 12; Creatinine, Ser 0.88; Hemoglobin 15.7; Platelets 223; Potassium 4.7; Sodium 140    Lipid Panel    Component Value Date/Time   CHOL 101 12/20/2019 1019  TRIG 104 12/20/2019 1019   HDL 31 (L) 12/20/2019 1019   CHOLHDL 3.3 12/20/2019 1019   CHOLHDL 3.2 07/11/2017 0000   VLDL 26 07/11/2017 0000   LDLCALC 50 12/20/2019 1019      Wt Readings from Last 3 Encounters:  07/17/20 258 lb (117 kg)  05/09/20 254 lb (115.2 kg)  03/28/20 256 lb 9.6 oz (116.4 kg)      Other studies Reviewed: Additional studies/ records that were reviewed today include: Labs. Review of the above records demonstrates:  Please see elsewhere in the note.     ASSESSMENT AND PLAN:   CAD: The patient has no new sypmtoms.  No further cardiovascular testing is indicated.  We will continue with aggressive risk reduction and meds as listed.  I think he has had any anginal symptoms since his last stress test.  HTN:His blood pressure is controlled.  Continue meds as listed.  DYSLIPIDEMIA:I will check a fasting lipid profile liver enzymes.  TOBACCO ABUSE: We talked about the need to stop smoking completely.  FATIGUE: This is his biggest complaint.  I strongly suspect that he has sleep apnea and I will screen him for this.  He has a high Epworth sleepiness scale.  I will check a TSH and a CBC.   Current medicines are reviewed at length with the patient today.  The patient does not have concerns regarding medicines.  The following changes have been made:  no change  Labs/ tests ordered today include:   Orders  Placed This Encounter  Procedures  . CBC  . Comprehensive metabolic panel  . Lipid panel  . TSH  . EKG 12-Lead  . Split night study     Disposition:   FU with in one year.     Signed, James Breeding, MD  07/17/2020 7:48 PM    [facility]

## 2020-07-17 ENCOUNTER — Other Ambulatory Visit: Payer: Self-pay

## 2020-07-17 ENCOUNTER — Ambulatory Visit (INDEPENDENT_AMBULATORY_CARE_PROVIDER_SITE_OTHER): Payer: Medicare Other | Admitting: Cardiology

## 2020-07-17 ENCOUNTER — Encounter: Payer: Self-pay | Admitting: Cardiology

## 2020-07-17 VITALS — BP 118/78 | HR 79 | Ht 73.0 in | Wt 258.0 lb

## 2020-07-17 DIAGNOSIS — R5383 Other fatigue: Secondary | ICD-10-CM | POA: Diagnosis not present

## 2020-07-17 DIAGNOSIS — Z720 Tobacco use: Secondary | ICD-10-CM | POA: Diagnosis not present

## 2020-07-17 DIAGNOSIS — E785 Hyperlipidemia, unspecified: Secondary | ICD-10-CM

## 2020-07-17 DIAGNOSIS — Z79899 Other long term (current) drug therapy: Secondary | ICD-10-CM | POA: Diagnosis not present

## 2020-07-17 DIAGNOSIS — I10 Essential (primary) hypertension: Secondary | ICD-10-CM | POA: Diagnosis not present

## 2020-07-17 DIAGNOSIS — I2510 Atherosclerotic heart disease of native coronary artery without angina pectoris: Secondary | ICD-10-CM

## 2020-07-17 NOTE — Patient Instructions (Addendum)
Medication Instructions:  The current medical regimen is effective;  continue present plan and medications.  *If you need a refill on your cardiac medications before your next appointment, please call your pharmacy*  Lab Work: Please have fasting lab work at your primary care doctor's office. (Lipid, CBC, BMP and TSH)  If you have labs (blood work) drawn today and your tests are completely normal, you will receive your results only by: [name] MyChart Message (if you have MyChart) OR . A paper copy in the mail If you have any lab test that is abnormal or we need to change your treatment, we will call you to review the results.  Testing/Procedures: Your physician has recommended that you have a sleep study. This test records several body functions during sleep, including: brain activity, eye movement, oxygen and carbon dioxide blood levels, heart rate and rhythm, breathing rate and rhythm, the flow of air through your mouth and nose, snoring, body muscle movements, and chest and belly movement.  This will be completed at [facility].  You will be contacted to be scheduled.  Follow-Up: At [facility], you and your health needs are our priority.  As part of our continuing mission to provide you with exceptional heart care, we have created designated Provider Care Teams.  These Care Teams include your primary Cardiologist (physician) and Advanced Practice Providers (APPs -  Physician Assistants and Nurse Practitioners) who all work together to provide you with the care you need, when you need it.  We recommend signing up for the patient portal called "MyChart".  Sign up information is provided on this After Visit Summary.  MyChart is used to connect with patients for Virtual Visits (Telemedicine).  Patients are able to view lab/test results, encounter notes, upcoming appointments, etc.  Non-urgent messages can be sent to your provider as well.   To learn more about what you can do with MyChart, go to  [email or web address].    Your next appointment:   12 month(s)  The format for your next appointment:   In Person  Provider:   Minus Breeding, MD   Thank you for choosing [facility]!!

## 2020-07-19 ENCOUNTER — Telehealth: Payer: Self-pay | Admitting: *Deleted

## 2020-07-19 NOTE — Telephone Encounter (Signed)
Left sleep study appointment details on home voicemail.

## 2020-07-19 NOTE — Patient Instructions (Signed)
Visit Information  PATIENT GOALS: Goals Addressed            This Visit's Progress   . Fall Prevention and Improved Strength and Balance   Not on track    Timeframe:  Short-Term Goal Priority:  High Start Date: 05/30/20                            Expected End Date: 08/27/20  Follow-up: 09/03/20                 . Reschedule physical therapy sessions . Work with [employer] and [name] to arrange transportation to therapy sessions . Use cane for ambulation . Move carefully and change positions slowly in order to decrease fall risk . Get outside everyday . Increase physical activity level slowly and carefully with an ultimate goal of 150 minutes a week . Keep phone with you at all times in case you fall . Call RN Care Manager as needed [phone]    . Improve My Heart Health-Coronary Artery Disease   On track    Timeframe:  Long-Range Goal Priority:  Medium Start Date:  06/10/20                          Expected End Date:  04/19/21                     Follow Up Date 09/03/20   . I can manage, know and watch for signs of a heart attack . if I have chest pain, call for help  . Take all of your medications each day . Eat plenty of vegetables, fruits, and lean protein . Do not eat a lot of sugary, fried, or fatty foods . See your cardiologist regularly o Dr Percival Spanish on 3/30 at 3:20 in the [place] office . Call Dr Percival Spanish as needed at [phone] . Reschedule physical therapy sessions . Walk daily. Use a cane if needed for balance.  [name] Keep phone with you at all times in case you fall   Why is this important?    Lifestyle changes are key to improving the blood flow to your heart. Think about the things you can change and set a goal to live healthy.   Remember, when the blood vessels to your heart start to get clogged you may not have any symptoms.   Over time, they can get worse.   Don't ignore the signs, like chest pain, and get help right away.     Notes:        The  patient verbalized understanding of instructions, educational materials, and care plan provided today and declined offer to receive copy of patient instructions, educational materials, and care plan.    Follow Up Plan:  . Telephone follow up appointment with care management team member scheduled for: 09/03/20 with RN Care Manager . The patient has been provided with contact information for the care management team and has been advised to call with any health related questions or concerns.  . Next cardiology appointment scheduled for: 07/17/20 at 3:20 in [facility], BSN, RN-BC [place] [facility] [place] Management Direct Dial: [phone]

## 2020-07-19 NOTE — Chronic Care Management (AMB) (Signed)
Chronic Care Management   CCM RN Visit Note  07/12/2020 Name: [name] MRN: [id number] DOB: 1956/03/05  Subjective: [name] is a 65 y.o. year old male who is a primary care patient of [name], [name], MD. The care management team was consulted for assistance with disease management and care coordination needs.    Engaged with patient by telephone for follow up visit in response to provider referral for case management and/or care coordination services.   Consent to Services:  The patient was given information about Chronic Care Management services, agreed to services, and gave verbal consent prior to initiation of services.  Please see initial visit note for detailed documentation.   Patient agreed to services and verbal consent obtained.   Assessment: Review of patient past medical history, allergies, medications, health status, including review of consultants reports, laboratory and other test data, was performed as part of comprehensive evaluation and provision of chronic care management services.   SDOH (Social Determinants of Health) assessments and interventions performed:    CCM Care Plan  Allergies  Allergen Reactions  . Chantix [Varenicline Tartrate] Nausea And Vomiting    Outpatient Encounter Medications as of 07/12/2020  Medication Sig  . acetaminophen (TYLENOL) 325 MG tablet Take 2 tablets (650 mg total) by mouth every 6 (six) hours as needed for mild pain or headache.  . albuterol (PROAIR HFA) 108 (90 Base) MCG/ACT inhaler 1-2 puffs every 6 hours as needed wheezing or shortness of breath.  [name] aspirin EC 81 MG tablet Take 1 tablet (81 mg total) by mouth daily.  [name] atorvastatin (LIPITOR) 40 MG tablet TAKE 1 TABLET ONCE DAILY AT 6PM  . buPROPion (WELLBUTRIN SR) 150 MG 12 hr tablet Take 1 tablet (150 mg total) by mouth daily with breakfast.  . butalbital-acetaminophen-caffeine (ESGIC) 50-325-40 MG tablet Take 1 tablet by mouth every 6 (six) hours as needed for  headache.  . celecoxib (CELEBREX) 200 MG capsule Take 1 capsule (200 mg total) by mouth daily as needed.  . cetaphil (CETAPHIL) lotion Apply 1 application topically 2 (two) times daily. For rash. Apply after washing with [facility] shower gel and rinsing with clear warm water.  . DULoxetine (CYMBALTA) 60 MG capsule Take 1 capsule (60 mg total) by mouth daily. WITH A FULL STOMACH AT SUPPER TIME  . fexofenadine (ALLEGRA) 180 MG tablet Take 1 tablet (180 mg total) by mouth daily. For allergy symptoms  . fluocinonide-emollient (LIDEX-E) 0.05 % cream Apply 1 application topically 2 (two) times daily. To affected areas  . fluticasone (FLONASE) 50 MCG/ACT nasal spray Place 2 sprays into both nostrils daily as needed for allergies or rhinitis.  . fluticasone furoate-vilanterol (BREO ELLIPTA) 100-25 MCG/INH AEPB Inhale 1 puff into the lungs daily.  . Ipratropium-Albuterol (COMBIVENT RESPIMAT) 20-100 MCG/ACT AERS respimat Inhale 1 puff into the lungs every 6 (six) hours  . isosorbide mononitrate (IMDUR) 60 MG 24 hr tablet Take 1 tablet (60 mg total) by mouth daily.  . metoprolol tartrate (LOPRESSOR) 25 MG tablet Take 1 tablet (25 mg total) by mouth 2 (two) times daily.  . mirtazapine (REMERON) 15 MG tablet Take 1 tablet (15 mg total) by mouth at bedtime. For sleep  . nitroGLYCERIN (NITROSTAT) 0.4 MG SL tablet Place 1 tablet (0.4 mg total) under the tongue every 5 (five) minutes x 3 doses as needed for chest pain.  . pantoprazole (PROTONIX) 40 MG tablet TAKE  (1)  TABLET TWICE A DAY.  . tamsulosin (FLOMAX) 0.4 MG CAPS capsule Take  2 capsules (0.8 mg total) by mouth daily after supper.   Facility-Administered Encounter Medications as of 07/12/2020  Medication  . cyanocobalamin ((VITAMIN B-12)) injection 1,000 mcg    Patient Active Problem List   Diagnosis Date Noted  . Degenerative disc disease, lumbar 01/16/2019  . Depression   . Hypokalemia 06/27/2018  . Seizure disorder ([place]) 02/07/2018  . Apraxia  of speech 10/13/2017  . Left-sided weakness 10/13/2017  . Coronary artery disease of native artery of native heart with stable angina pectoris ([place])   . Illiteracy 10/23/2016  . Carpal tunnel syndrome of right wrist 06/15/2016  . GAD (generalized anxiety disorder) 05/01/2016  . History of MI (myocardial infarction) 02/26/2015  . Dyslipidemia, goal LDL below 70 10/08/2009  . Infantile cerebral palsy ([place]) 10/08/2009  . Essential hypertension 10/08/2009    Conditions to be addressed/monitored: Infantile Cerebral Palsy with cognitive deficits, GAD, illiteracy, apraxia of speech, angina, hx of MI, CAD, dyslipidemia,B12 deficiency, eczema, DDD, Depression, HTN, COPD  Care Plan : RNCM: Cerebral Palsy  Updates made by [name], RN since 07/19/2020 12:00 AM    Problem: Weakness and Falls   Priority: High    Goal: Prevent Falls by Improving Strength and Balance   Start Date: 05/30/2020  This Visit's Progress: Not on track  Recent Progress: On track  Priority: High  Note:   Current Barriers:  . Care Coordination needs related to generalized weakness and frequent falls in a patient with infantile cerebral palsy, HTN, and hx of MI . Lacks caregiver support.  . Transportation barriers . Unable to independently drive . Lacks social connections . Cognitive deficits and illiteracy   Nurse Case Manager Clinical Goal(s):  . patient will work with [facility] to address needs related to weakness and recurrent falls . patient will meet with RN Care Manager to address fall prevention  Interventions:  . 1:1 collaboration with [name], MD regarding development and update of comprehensive plan of care as evidenced by provider attestation and co-signature . Inter-disciplinary care team collaboration (see longitudinal plan of care) . Chart reviewed including relevant office notes, lab results, and referral notes . Medications reviewed . Previously discussed lift  chair o RNCM and Care Guides have worked to try and find a way to replace his broken lift chair but have been unsuccessful. o Patient says that it feels loose and that it may just need some screws tightened. He's going to check on this.  o Discussed other chair options . Discussed referral for Outpatient Physical therapy for gait training and strengthening to improve balance and decrease fall risk o Patient went for several weeks but stopped because he felt too tired afterwards and into the next day . Encouraged patient to reach back out to reschedule sessions and talk with physical therapist about his fatigue after each session . Discussed current physical activity level o Patient reports getting outdoors more often since the weather is warmer o He feels that his strength and balance have improved and he's walking around outdoors more . Encouraged patient to keep phone with him while walking outdoors and use a cane if needed . Encouraged patient to reach out to [facility] team as needed  Patient Goals/Self-Care Activities Over the next 90 days, patient will: . Reschedule physical therapy sessions . Work with [employer] and [name] to arrange transportation to therapy sessions . Use cane for ambulation . Move carefully and change positions slowly in order to decrease fall risk . Get outside everyday . Increase  physical activity level slowly and carefully with an ultimate goal of 150 minutes a week . Keep phone with you at all times in case you fall . Call RN Care Manager as needed [phone]    Care Plan : RNCM:Coronary Artery Disease (Adult)  Updates made by [name], RN since 07/19/2020 12:00 AM    Problem: Disease Progression (Coronary Artery Disease)   Priority: Medium    Long-Range Goal: Disease Progression Prevented or Minimized   Start Date: 06/10/2020  This Visit's Progress: On track  Recent Progress: On track  Priority: Medium  Note:   Current Barriers:  . Chronic Disease  Management support and education needs related to CAD in a patient with hypertension and hx of MI . Unable to independently drive . Unable to perform IADLs independently  Nurse Case Manager Clinical Goal(s):  . patient will work with Consulting civil engineer to address needs related to self-management of CAD . patient will attend all scheduled medical appointments: Dr Percival Spanish 07/17/20 in [place] office  Interventions:  . 1:1 collaboration with [name], MD regarding development and update of comprehensive plan of care as evidenced by provider attestation and co-signature . Inter-disciplinary care team collaboration (see longitudinal plan of care) . Evaluation of current treatment plan related to CAD and patient's adherence to plan as established by provider. . Chart reviewed including relevant office notes and lab results . Reviewed and discussed appointments and appointment recalls . Collaborated with [place] to schedule patient appointment with Dr Percival Spanish in [place] on 07/17/20 at 3:20 o Patient aware . Collaborated with [name], [name] to arrange transportation for [place] to cardiology appointment on 07/17/20 . Reviewed and discussed medications o Taking prepackaged medications as directed o [employer] his mediations one month at a time and delivers them o Has not had to take nitro "in a while" . Reviewed s/s of a heart attack o No chest pain or pressure . Encouraged increasing physical activity as tolerated o Worked with PT for several weeks but cancelled future appointments because he felt they were working him too hard. He was very tired after each session and even into the next day o Encouraged patient to reschedule visits and talk with Physical therapist about his concerns o He is feeling stronger and better overall though and his balance has improved. He isn't as afraid of falling. o He has been walking outdoors when the weather is  nice . Discussed diet o Has mostly frozen meals for convenience o Reinforced need to limit fried, fatty, and sugary foods and to eat vegetables, fruits, and lean proteins . Reinforced to call 911 if he has any s/s of a heart attack . Encouraged to reach out to [facility] team as needed  Patient Goals/Self-Care Activities Over the next 60 days, patient will: . I can manage, know and watch for signs of a heart attack . if I have chest pain, call for help  . Take all of your medications each day . Eat plenty of vegetables, fruits, and lean protein . Do not eat a lot of sugary, fried, or fatty foods . See your cardiologist regularly o Dr Percival Spanish on 3/30 at 3:20 in the [place] office . Call Dr Percival Spanish as needed at [phone] . Reschedule physical therapy sessions . Walk daily. Use a cane if needed for balance.  [name] Keep phone with you at all times in case you fall     Follow Up Plan:  . Telephone follow up appointment  with care management team member scheduled for: 09/03/20 with RN Care Manager . The patient has been provided with contact information for the care management team and has been advised to call with any health related questions or concerns.  . Next cardiology appointment scheduled for: 07/17/20 at 3:20 in [facility], BSN, RN-BC [place] [facility] [place] Management Direct Dial: [phone]

## 2020-07-22 ENCOUNTER — Telehealth: Payer: Self-pay | Admitting: *Deleted

## 2020-07-22 NOTE — Telephone Encounter (Signed)
-----   Message from Shellia Cleverly, RN sent at 07/17/2020  5:17 PM EDT ----- Regarding: sleep study Dr Percival Spanish has ordered this pt to have a sleep study at [facility].  Pt is aware he will be contacted to be scheduled.   Thank you  Pam

## 2020-08-06 ENCOUNTER — Encounter: Payer: Medicare Other | Admitting: Cardiovascular Disease

## 2020-08-12 ENCOUNTER — Ambulatory Visit (INDEPENDENT_AMBULATORY_CARE_PROVIDER_SITE_OTHER): Payer: Medicare Other

## 2020-08-12 ENCOUNTER — Telehealth: Payer: Self-pay | Admitting: *Deleted

## 2020-08-12 ENCOUNTER — Other Ambulatory Visit: Payer: Self-pay

## 2020-08-12 DIAGNOSIS — J431 Panlobular emphysema: Secondary | ICD-10-CM

## 2020-08-12 DIAGNOSIS — E538 Deficiency of other specified B group vitamins: Secondary | ICD-10-CM | POA: Diagnosis not present

## 2020-08-12 MED ORDER — FLUTICASONE PROPIONATE 50 MCG/ACT NA SUSP: 2.0000 | Freq: Every day | NASAL | 3 refills | Status: AC | PRN

## 2020-08-12 NOTE — Telephone Encounter (Signed)
08/12/2020  Patient spoke with [name], [place] relayed that patient would like a refill of Flonase sent to [facility] for delivery.   Forwarding to [facility] Clinical staff for review and action.  [name], BSN, RN-BC Embedded Chronic Care Manager [facility] [place] Management Direct Dial: [phone]

## 2020-08-12 NOTE — Progress Notes (Signed)
Cyanocobalamin injection given to left deltoid.  Patient tolerated well. 

## 2020-08-12 NOTE — Telephone Encounter (Signed)
Refill sent to pharmacy, patient aware ?

## 2020-08-14 ENCOUNTER — Telehealth: Payer: Medicare Other | Admitting: *Deleted

## 2020-09-02 ENCOUNTER — Ambulatory Visit (INDEPENDENT_AMBULATORY_CARE_PROVIDER_SITE_OTHER): Payer: Medicare Other | Admitting: Licensed Clinical Social Worker

## 2020-09-02 DIAGNOSIS — F32A Depression, unspecified: Secondary | ICD-10-CM

## 2020-09-02 DIAGNOSIS — I10 Essential (primary) hypertension: Secondary | ICD-10-CM

## 2020-09-02 DIAGNOSIS — F411 Generalized anxiety disorder: Secondary | ICD-10-CM

## 2020-09-02 DIAGNOSIS — M5136 Other intervertebral disc degeneration, lumbar region: Secondary | ICD-10-CM

## 2020-09-02 DIAGNOSIS — J441 Chronic obstructive pulmonary disease with (acute) exacerbation: Secondary | ICD-10-CM

## 2020-09-02 DIAGNOSIS — G809 Cerebral palsy, unspecified: Secondary | ICD-10-CM

## 2020-09-02 DIAGNOSIS — E538 Deficiency of other specified B group vitamins: Secondary | ICD-10-CM

## 2020-09-02 DIAGNOSIS — I25118 Atherosclerotic heart disease of native coronary artery with other forms of angina pectoris: Secondary | ICD-10-CM

## 2020-09-02 DIAGNOSIS — R482 Apraxia: Secondary | ICD-10-CM

## 2020-09-02 DIAGNOSIS — I252 Old myocardial infarction: Secondary | ICD-10-CM

## 2020-09-02 DIAGNOSIS — F3341 Major depressive disorder, recurrent, in partial remission: Secondary | ICD-10-CM

## 2020-09-02 NOTE — Patient Instructions (Signed)
Visit Information  PATIENT GOALS: Goals Addressed            This Visit's Progress   . Increase Personal Care Services Hours; Receive in home care weekly as needed through home health agency       Timeframe:  Short-Term Goal Priority:  Medium Progress: On Track Start Date:             09/02/20                Expected End Date:           12/03/20            Follow-up: 10/07/20  Patient Self Care Activities:  . Self administers medications as prescribed . Attends all scheduled provider appointments . Performs ADL's independently  Patient Coping Strengths:  . Family  Patient Self Care Deficits:  [name] Mobility issues . Needs some help occasionally with in home care needs  Patient Goals:  - spend time or talk with others at least 2 to 3 times per week - practice relaxation or meditation daily - keep a calendar with appointment dates  Follow Up Plan: [name] to call client on 10/07/20       [name].Quinlee Sciarra MSW, [name] Licensed Clinical Social Worker [facility] Care Management [phone]

## 2020-09-02 NOTE — Chronic Care Management (AMB) (Signed)
Chronic Care Management    Clinical Social Work Note  09/02/2020 Name: [name] MRN: [id number] DOB: 1955-07-12  [name] is a 65 y.o. year old male who is a primary care patient of [name], [name], MD. The CCM team was consulted to assist the patient with chronic disease management and/or care coordination needs related to: [name] .   Engaged with patient by telephone for follow up visit in response to provider referral for social work chronic care management and care coordination services.   Consent to Services:  The patient was given information about Chronic Care Management services, agreed to services, and gave verbal consent prior to initiation of services.  Please see initial visit note for detailed documentation.   Patient agreed to services and consent obtained.   Assessment: Review of patient past medical history, allergies, medications, and health status, including review of relevant consultants reports was performed today as part of a comprehensive evaluation and provision of chronic care management and care coordination services.     SDOH (Social Determinants of Health) assessments and interventions performed:  SDOH Interventions   Flowsheet Row Most Recent Value  SDOH Interventions   Depression Interventions/Treatment  Counseling       Advanced Directives Status: See Vynca application for related entries.  CCM Care Plan  Allergies  Allergen Reactions  . Chantix [Varenicline Tartrate] Nausea And Vomiting    Outpatient Encounter Medications as of 09/02/2020  Medication Sig  . acetaminophen (TYLENOL) 325 MG tablet Take 2 tablets (650 mg total) by mouth every 6 (six) hours as needed for mild pain or headache.  . albuterol (PROAIR HFA) 108 (90 Base) MCG/ACT inhaler 1-2 puffs every 6 hours as needed wheezing or shortness of breath.  [name] aspirin EC 81 MG tablet Take 1 tablet (81 mg total) by mouth daily.  [name] atorvastatin (LIPITOR) 40 MG tablet TAKE 1  TABLET ONCE DAILY AT 6PM  . buPROPion (WELLBUTRIN SR) 150 MG 12 hr tablet Take 1 tablet (150 mg total) by mouth daily with breakfast.  . butalbital-acetaminophen-caffeine (ESGIC) 50-325-40 MG tablet Take 1 tablet by mouth every 6 (six) hours as needed for headache.  . celecoxib (CELEBREX) 200 MG capsule Take 1 capsule (200 mg total) by mouth daily as needed.  . cetaphil (CETAPHIL) lotion Apply 1 application topically 2 (two) times daily. For rash. Apply after washing with [facility] shower gel and rinsing with clear warm water.  . DULoxetine (CYMBALTA) 60 MG capsule Take 1 capsule (60 mg total) by mouth daily. WITH A FULL STOMACH AT SUPPER TIME  . fexofenadine (ALLEGRA) 180 MG tablet Take 1 tablet (180 mg total) by mouth daily. For allergy symptoms  . fluocinonide-emollient (LIDEX-E) 0.05 % cream Apply 1 application topically 2 (two) times daily. To affected areas  . fluticasone (FLONASE) 50 MCG/ACT nasal spray Place 2 sprays into both nostrils daily as needed for allergies or rhinitis.  . fluticasone furoate-vilanterol (BREO ELLIPTA) 100-25 MCG/INH AEPB Inhale 1 puff into the lungs daily.  . Ipratropium-Albuterol (COMBIVENT RESPIMAT) 20-100 MCG/ACT AERS respimat Inhale 1 puff into the lungs every 6 (six) hours  . isosorbide mononitrate (IMDUR) 60 MG 24 hr tablet Take 1 tablet (60 mg total) by mouth daily.  . metoprolol tartrate (LOPRESSOR) 25 MG tablet Take 1 tablet (25 mg total) by mouth 2 (two) times daily.  . mirtazapine (REMERON) 15 MG tablet Take 1 tablet (15 mg total) by mouth at bedtime. For sleep  . nitroGLYCERIN (NITROSTAT) 0.4 MG SL tablet Place  1 tablet (0.4 mg total) under the tongue every 5 (five) minutes x 3 doses as needed for chest pain.  . pantoprazole (PROTONIX) 40 MG tablet TAKE  (1)  TABLET TWICE A DAY.  . tamsulosin (FLOMAX) 0.4 MG CAPS capsule Take 2 capsules (0.8 mg total) by mouth daily after supper.   Facility-Administered Encounter Medications as of 09/02/2020   Medication  . cyanocobalamin ((VITAMIN B-12)) injection 1,000 mcg    Patient Active Problem List   Diagnosis Date Noted  . Degenerative disc disease, lumbar 01/16/2019  . Depression   . Hypokalemia 06/27/2018  . Seizure disorder ([place]) 02/07/2018  . Apraxia of speech 10/13/2017  . Left-sided weakness 10/13/2017  . Coronary artery disease of native artery of native heart with stable angina pectoris ([place])   . Illiteracy 10/23/2016  . Carpal tunnel syndrome of right wrist 06/15/2016  . GAD (generalized anxiety disorder) 05/01/2016  . History of MI (myocardial infarction) 02/26/2015  . Dyslipidemia, goal LDL below 70 10/08/2009  . Infantile cerebral palsy ([place]) 10/08/2009  . Essential hypertension 10/08/2009    Conditions to be addressed/monitored: Monitor ADLs completion of client; Monitor client management of depression;   Care Plan : [name]  Updates made by [name], [name] since 09/02/2020 12:00 AM    Problem: Emotional Distress     Goal: Emotional Health Supported;Manage ADLs; Manage depression issues   Start Date: 09/02/2020  Expected End Date: 12/03/2020  This Visit's Progress: On track  Priority: Medium  Note:   Current Barriers:  . Chronic Mental Health needs related to depression management and anxiety management . Mobility issues . Suicidal Ideation/Homicidal Ideation: No  Clinical Social Work Goal(s):  . patient will work with SW monthly by telephone or in person to reduce or manage symptoms related to depression and anxiety issues . Patient will call RNCM or [name] as needed in next 30 days for CCM support  Interventions: . 1:1 collaboration with [name], MD regarding development and update of comprehensive plan of care as evidenced by provider attestation and co-signature . Talked with client about transport arrangements for B12 injection appointment for client on 09/11/20 at [facility] . Talked with client about food provision of client . Talked  with client about sleeping issues of client . Talked with client about pain issues of client . Talked with client about mobility of client  Patient Self Care Activities:  . Self administers medications as prescribed . Attends all scheduled provider appointments . Performs ADL's independently  Patient Coping Strengths:  . Family  Patient Self Care Deficits:  [name] Mobility issues . Needs some help occasionally with in home care needs  Patient Goals:  - spend time or talk with others at least 2 to 3 times per week - practice relaxation or meditation daily - keep a calendar with appointment dates  Follow Up Plan: [name] to call client on 10/07/20    Josian Lanese.Bettejane Leavens MSW, [name] Licensed Clinical Social Worker [facility] Care Management [phone]

## 2020-09-03 ENCOUNTER — Ambulatory Visit: Payer: Medicare Other | Admitting: *Deleted

## 2020-09-03 DIAGNOSIS — I25118 Atherosclerotic heart disease of native coronary artery with other forms of angina pectoris: Secondary | ICD-10-CM | POA: Diagnosis not present

## 2020-09-03 DIAGNOSIS — G809 Cerebral palsy, unspecified: Secondary | ICD-10-CM

## 2020-09-03 DIAGNOSIS — F3341 Major depressive disorder, recurrent, in partial remission: Secondary | ICD-10-CM | POA: Diagnosis not present

## 2020-09-03 DIAGNOSIS — F32A Depression, unspecified: Secondary | ICD-10-CM | POA: Diagnosis not present

## 2020-09-03 DIAGNOSIS — I10 Essential (primary) hypertension: Secondary | ICD-10-CM

## 2020-09-03 DIAGNOSIS — I252 Old myocardial infarction: Secondary | ICD-10-CM | POA: Diagnosis not present

## 2020-09-03 DIAGNOSIS — J441 Chronic obstructive pulmonary disease with (acute) exacerbation: Secondary | ICD-10-CM | POA: Diagnosis not present

## 2020-09-06 NOTE — Chronic Care Management (AMB) (Signed)
Chronic Care Management   CCM RN Visit Note  09/03/2020 Name: [name] MRN: [id number] DOB: 02-23-1956  Subjective: [name] is a 65 y.o. year old male who is a primary care patient of [name], [name], MD. The care management team was consulted for assistance with disease management and care coordination needs.    Engaged with patient by telephone for follow up visit in response to provider referral for case management and/or care coordination services.   Consent to Services:  The patient was given information about Chronic Care Management services, agreed to services, and gave verbal consent prior to initiation of services.  Please see initial visit note for detailed documentation.   Patient agreed to services and verbal consent obtained.   Assessment: Review of patient past medical history, allergies, medications, health status, including review of consultants reports, laboratory and other test data, was performed as part of comprehensive evaluation and provision of chronic care management services.   SDOH (Social Determinants of Health) assessments and interventions performed:    CCM Care Plan  Allergies  Allergen Reactions  . Chantix [Varenicline Tartrate] Nausea And Vomiting    Outpatient Encounter Medications as of 09/03/2020  Medication Sig  . acetaminophen (TYLENOL) 325 MG tablet Take 2 tablets (650 mg total) by mouth every 6 (six) hours as needed for mild pain or headache.  . albuterol (PROAIR HFA) 108 (90 Base) MCG/ACT inhaler 1-2 puffs every 6 hours as needed wheezing or shortness of breath.  [name] aspirin EC 81 MG tablet Take 1 tablet (81 mg total) by mouth daily.  [name] atorvastatin (LIPITOR) 40 MG tablet TAKE 1 TABLET ONCE DAILY AT 6PM  . buPROPion (WELLBUTRIN SR) 150 MG 12 hr tablet Take 1 tablet (150 mg total) by mouth daily with breakfast.  . butalbital-acetaminophen-caffeine (ESGIC) 50-325-40 MG tablet Take 1 tablet by mouth every 6 (six) hours as needed for  headache.  . celecoxib (CELEBREX) 200 MG capsule Take 1 capsule (200 mg total) by mouth daily as needed.  . cetaphil (CETAPHIL) lotion Apply 1 application topically 2 (two) times daily. For rash. Apply after washing with [facility] shower gel and rinsing with clear warm water.  . DULoxetine (CYMBALTA) 60 MG capsule Take 1 capsule (60 mg total) by mouth daily. WITH A FULL STOMACH AT SUPPER TIME  . fexofenadine (ALLEGRA) 180 MG tablet Take 1 tablet (180 mg total) by mouth daily. For allergy symptoms  . fluocinonide-emollient (LIDEX-E) 0.05 % cream Apply 1 application topically 2 (two) times daily. To affected areas  . fluticasone (FLONASE) 50 MCG/ACT nasal spray Place 2 sprays into both nostrils daily as needed for allergies or rhinitis.  . fluticasone furoate-vilanterol (BREO ELLIPTA) 100-25 MCG/INH AEPB Inhale 1 puff into the lungs daily.  . Ipratropium-Albuterol (COMBIVENT RESPIMAT) 20-100 MCG/ACT AERS respimat Inhale 1 puff into the lungs every 6 (six) hours  . isosorbide mononitrate (IMDUR) 60 MG 24 hr tablet Take 1 tablet (60 mg total) by mouth daily.  . metoprolol tartrate (LOPRESSOR) 25 MG tablet Take 1 tablet (25 mg total) by mouth 2 (two) times daily.  . mirtazapine (REMERON) 15 MG tablet Take 1 tablet (15 mg total) by mouth at bedtime. For sleep  . nitroGLYCERIN (NITROSTAT) 0.4 MG SL tablet Place 1 tablet (0.4 mg total) under the tongue every 5 (five) minutes x 3 doses as needed for chest pain.  . pantoprazole (PROTONIX) 40 MG tablet TAKE  (1)  TABLET TWICE A DAY.  . tamsulosin (FLOMAX) 0.4 MG CAPS capsule Take  2 capsules (0.8 mg total) by mouth daily after supper.   Facility-Administered Encounter Medications as of 09/03/2020  Medication  . cyanocobalamin ((VITAMIN B-12)) injection 1,000 mcg    Patient Active Problem List   Diagnosis Date Noted  . Degenerative disc disease, lumbar 01/16/2019  . Depression   . Hypokalemia 06/27/2018  . Seizure disorder ([place]) 02/07/2018  . Apraxia  of speech 10/13/2017  . Left-sided weakness 10/13/2017  . Coronary artery disease of native artery of native heart with stable angina pectoris ([place])   . Illiteracy 10/23/2016  . Carpal tunnel syndrome of right wrist 06/15/2016  . GAD (generalized anxiety disorder) 05/01/2016  . History of MI (myocardial infarction) 02/26/2015  . Dyslipidemia, goal LDL below 70 10/08/2009  . Infantile cerebral palsy ([place]) 10/08/2009  . Essential hypertension 10/08/2009    Conditions to be addressed/monitored:CAD, HTN and cerebral palsy, high fall risk  Care Plan : RNCM: Cerebral Palsy  Updates made by [name], RN since 09/06/2020 12:00 AM    Problem: Weakness and Falls   Priority: High    Goal: Prevent Falls by Improving Strength and Balance   Start Date: 05/30/2020  This Visit's Progress: On track  Recent Progress: Not on track  Priority: High  Note:   Current Barriers:  . Lacks caregiver support.  . Transportation barriers . Unable to independently drive . Lacks social connections . Cognitive deficits and illiteracy   Nurse Case Manager Clinical Goal(s):  [name] Patient will demonstrate ongoing self-health management by practicing fall prevention strategies . patient will meet with RN Care Manager to address fall prevention  Interventions:  . 1:1 collaboration with [name], MD regarding development and update of comprehensive plan of care as evidenced by provider attestation and co-signature . Inter-disciplinary care team collaboration (see longitudinal plan of care) . Chart reviewed including relevant office notes, lab results, and referral notes . Medications reviewed and patient is compliant . Fall assessment complete o No recent falls . Discussed mobility and ambulation o Discontinued physical therapy  o He feels that his strength and balance have improved and he's walking around outdoors more . Verbal education on fall prevention strategies given . Encouraged patient to  keep phone with him while walking outdoors and use a cane if needed . Encouraged patient to reach out to [facility] team as needed  Patient Goals/Self-Care Activities Over the next 90 days, patient will: [name] Use cane for ambulation . Move carefully and change positions slowly in order to decrease fall risk . Get outside everyday . Increase physical activity level slowly and carefully with an ultimate goal of 150 minutes a week . Keep phone with you at all times in case you fall . Keep a clear path through your home . Know where your dog is at all times so you don't fall over him . Call RN Care Manager as needed [phone]    Care Plan : RNCM:Coronary Artery Disease (Adult)  Updates made by [name], RN since 09/06/2020 12:00 AM    Problem: Disease Progression (Coronary Artery Disease)   Priority: Medium    Long-Range Goal: Disease Progression Prevented or Minimized   Start Date: 06/10/2020  This Visit's Progress: Not on track  Recent Progress: On track  Priority: Medium  Note:   Current Barriers:  . Chronic Disease Management support and education needs related to CAD in a patient with hypertension and hx of MI . Unable to independently drive . Unable to perform IADLs independently  Nurse Case Manager  Clinical Goal(s):  . patient will work with Consulting civil engineer to address needs related to self-management of CAD . patient will attend all scheduled medical appointments: Dr Percival Spanish 07/17/20 in [place] office  Interventions:  . 1:1 collaboration with [name], MD regarding development and update of comprehensive plan of care as evidenced by provider attestation and co-signature . Inter-disciplinary care team collaboration (see longitudinal plan of care) . Evaluation of current treatment plan related to CAD and patient's adherence to plan as established by provider. . Chart reviewed including relevant office notes and lab results . Discussed recent cardiology visit . Reviewed  referral and appointment for sleep study o Patient did not show up for sleep study appt on 08/06/20 . Collaborated with [name], [name] regarding transportation and ongoing psychosocial concerns . Reviewed and discussed medications o Taking prepackaged medications as directed o [employer] his mediations one month at a time and delivers them o Has not had to take nitro "in a while" . Reviewed s/s of a heart attack o No chest pain or pressure . Encouraged increasing physical activity as tolerated o He is feeling stronger and better overall though and his balance has improved. He isn't as afraid of falling. o He has been walking outdoors when the weather is nice and working some outside . Discussed diet o Has mostly frozen meals for convenience o Reinforced need to limit fried, fatty, and sugary foods and to eat vegetables, fruits, and lean proteins . Reinforced to call 911 if he has any s/s of a heart attack . Encouraged to reschedule sleep study . Encouraged to reach out to [facility] team as needed  Patient Goals/Self-Care Activities Over the next 60 days, patient will: . I can manage, know and watch for signs of a heart attack . if I have chest pain, call for help  . Take all of your medications each day . Eat plenty of vegetables, fruits, and lean protein . Do not eat a lot of sugary, fried, or fatty foods . Keep all medical appointments . Reschedule sleep study . Call Dr Percival Spanish as needed at [phone] . Walk daily. Use a cane if needed for balance.  [name] Keep phone with you at all times in case you fall . Call RN Care Manager as needed [phone]    Follow Up Plan:  . Telephone follow up appointment with care management team member scheduled for: 10/17/20 with RNCM . The patient has been provided with contact information for the care management team and has been advised to call with any health related questions or concerns.   [name], BSN, RN-BC Embedded  Chronic Care Manager [facility] / [place] Management Direct Dial: [phone]

## 2020-09-06 NOTE — Patient Instructions (Signed)
Visit Information  PATIENT GOALS: Goals Addressed            This Visit's Progress   . Fall Prevention and Improved Strength and Balance   On track    Timeframe:  Short-Term Goal Priority:  High Start Date: 05/30/20                            Expected End Date: 11/18/20  Follow-up: 10/17/20             . Use cane for ambulation . Move carefully and change positions slowly in order to decrease fall risk . Get outside everyday . Increase physical activity level slowly and carefully with an ultimate goal of 150 minutes a week . Keep phone with you at all times in case you fall . Keep a clear path through your home . Know where your dog is at all times so you don't fall over him . Call RN Care Manager as needed [phone]    . Improve My Heart Health-Coronary Artery Disease   Not on track    Timeframe:  Long-Range Goal Priority:  Medium Start Date:  06/10/20                          Expected End Date:  04/19/21                     Follow Up Date 10/17/20  . I can manage, know and watch for signs of a heart attack . if I have chest pain, call for help  . Take all of your medications each day . Eat plenty of vegetables, fruits, and lean protein . Do not eat a lot of sugary, fried, or fatty foods . Keep all medical appointments . Reschedule sleep study . Call Dr Percival Spanish as needed at [phone] . Walk daily. Use a cane if needed for balance.  [name] Keep phone with you at all times in case you fall . Call RN Care Manager as needed [phone]     Why is this important?    Lifestyle changes are key to improving the blood flow to your heart. Think about the things you can change and set a goal to live healthy.   Remember, when the blood vessels to your heart start to get clogged you may not have any symptoms.   Over time, they can get worse.   Don't ignore the signs, like chest pain, and get help right away.     Notes:        Patient verbalizes understanding of  instructions provided today and agrees to view in [place].    Follow Up Plan:  . Telephone follow up appointment with care management team member scheduled for: 10/17/20 with RNCM . The patient has been provided with contact information for the care management team and has been advised to call with any health related questions or concerns.   [name], BSN, RN-BC Embedded Chronic Care Manager [facility] / [place] Management Direct Dial: [phone]

## 2020-09-11 ENCOUNTER — Other Ambulatory Visit: Payer: Self-pay

## 2020-09-11 ENCOUNTER — Ambulatory Visit (INDEPENDENT_AMBULATORY_CARE_PROVIDER_SITE_OTHER): Payer: Medicare Other | Admitting: *Deleted

## 2020-09-11 ENCOUNTER — Ambulatory Visit (INDEPENDENT_AMBULATORY_CARE_PROVIDER_SITE_OTHER): Payer: Medicare Other

## 2020-09-11 VITALS — Ht 73.0 in | Wt 258.0 lb

## 2020-09-11 DIAGNOSIS — E538 Deficiency of other specified B group vitamins: Secondary | ICD-10-CM

## 2020-09-11 DIAGNOSIS — Z0000 Encounter for general adult medical examination without abnormal findings: Secondary | ICD-10-CM | POA: Diagnosis not present

## 2020-09-11 NOTE — Patient Instructions (Signed)
[name] , Thank you for taking time to come for your Medicare Wellness Visit. I appreciate your ongoing commitment to your health goals. Please review the following plan we discussed and let me know if I can assist you in the future.   Screening recommendations/referrals: Colonoscopy: Done 04/19/2015 - Repeat in 10 years Recommended yearly ophthalmology/optometry visit for glaucoma screening and checkup Recommended yearly dental visit for hygiene and checkup  Vaccinations: Influenza vaccine: Done 02/06/2012 - Repeat every fall Pneumococcal vaccine: Prevnar done 02/07/2018; Get Pneumovax soon Tdap vaccine: Done 12/05/2011 - Due for repeat August 2023 Shingles vaccine: Due Shingrix discussed. Please contact your pharmacy for coverage information.    Covid-19: Done 05/16/19 & 06/13/19 - may have gotten booster - bring card to next visit.  Advanced directives: Please bring a copy of your health care power of attorney and living will to the office to be added to your chart at your convenience.  Conditions/risks identified: Aim for 30 minutes of exercise or brisk walking each day, drink 6-8 glasses of water and eat lots of fruits and vegetables.  Next appointment: Follow up in one year for your annual wellness visit.   Preventive Care 68 Years and Older, Male  Preventive care refers to lifestyle choices and visits with your health care provider that can promote health and wellness. What does preventive care include?  A yearly physical exam. This is also called an annual well check.  Dental exams once or twice a year.  Routine eye exams. Ask your health care provider how often you should have your eyes checked.  Personal lifestyle choices, including:  Daily care of your teeth and gums.  Regular physical activity.  Eating a healthy diet.  Avoiding tobacco and drug use.  Limiting alcohol use.  Practicing safe sex.  Taking low doses of aspirin every day.  Taking vitamin and  mineral supplements as recommended by your health care provider. What happens during an annual well check? The services and screenings done by your health care provider during your annual well check will depend on your age, overall health, lifestyle risk factors, and family history of disease. Counseling  Your health care provider may ask you questions about your:  Alcohol use.  Tobacco use.  Drug use.  Emotional well-being.  Home and relationship well-being.  Sexual activity.  Eating habits.  History of falls.  Memory and ability to understand (cognition).  Work and work Statistician. Screening  You may have the following tests or measurements:  Height, weight, and BMI.  Blood pressure.  Lipid and cholesterol levels. These may be checked every 5 years, or more frequently if you are over 44 years old.  Skin check.  Lung cancer screening. You may have this screening every year starting at age 49 if you have a 30-pack-year history of smoking and currently smoke or have quit within the past 15 years.  Fecal occult blood test (FOBT) of the stool. You may have this test every year starting at age 34.  Flexible sigmoidoscopy or colonoscopy. You may have a sigmoidoscopy every 5 years or a colonoscopy every 10 years starting at age 69.  Prostate cancer screening. Recommendations will vary depending on your family history and other risks.  Hepatitis C blood test.  Hepatitis B blood test.  Sexually transmitted disease (STD) testing.  Diabetes screening. This is done by checking your blood sugar (glucose) after you have not eaten for a while (fasting). You may have this done every 1-3 years.  Abdominal aortic  aneurysm (AAA) screening. You may need this if you are a current or former smoker.  Osteoporosis. You may be screened starting at age 37 if you are at high risk. Talk with your health care provider about your test results, treatment options, and if necessary, the need  for more tests. Vaccines  Your health care provider may recommend certain vaccines, such as:  Influenza vaccine. This is recommended every year.  Tetanus, diphtheria, and acellular pertussis (Tdap, Td) vaccine. You may need a Td booster every 10 years.  Zoster vaccine. You may need this after age 53.  Pneumococcal 13-valent conjugate (PCV13) vaccine. One dose is recommended after age 47.  Pneumococcal polysaccharide (PPSV23) vaccine. One dose is recommended after age 39. Talk to your health care provider about which screenings and vaccines you need and how often you need them. This information is not intended to replace advice given to you by your health care provider. Make sure you discuss any questions you have with your health care provider. Document Released: 05/03/2015 Document Revised: 12/25/2015 Document Reviewed: 02/05/2015 Elsevier Interactive Patient Education  2017 [employer] Prevention in the Home Falls can cause injuries. They can happen to people of all ages. There are many things you can do to make your home safe and to help prevent falls. What can I do on the outside of my home?  Regularly fix the edges of walkways and driveways and fix any cracks.  Remove anything that might make you trip as you walk through a door, such as a raised step or threshold.  Trim any bushes or trees on the path to your home.  Use bright outdoor lighting.  Clear any walking paths of anything that might make someone trip, such as rocks or tools.  Regularly check to see if handrails are loose or broken. Make sure that both sides of any steps have handrails.  Any raised decks and porches should have guardrails on the edges.  Have any leaves, snow, or ice cleared regularly.  Use sand or salt on walking paths during winter.  Clean up any spills in your garage right away. This includes oil or grease spills. What can I do in the bathroom?  Use night lights.  Install grab bars  by the toilet and in the tub and shower. Do not use towel bars as grab bars.  Use non-skid mats or decals in the tub or shower.  If you need to sit down in the shower, use a plastic, non-slip stool.  Keep the floor dry. Clean up any water that spills on the floor as soon as it happens.  Remove soap buildup in the tub or shower regularly.  Attach bath mats securely with double-sided non-slip rug tape.  Do not have throw rugs and other things on the floor that can make you trip. What can I do in the bedroom?  Use night lights.  Make sure that you have a light by your bed that is easy to reach.  Do not use any sheets or blankets that are too big for your bed. They should not hang down onto the floor.  Have a firm chair that has side arms. You can use this for support while you get dressed.  Do not have throw rugs and other things on the floor that can make you trip. What can I do in the kitchen?  Clean up any spills right away.  Avoid walking on wet floors.  Keep items that you use a lot  in easy-to-reach places.  If you need to reach something above you, use a strong step stool that has a grab bar.  Keep electrical cords out of the way.  Do not use floor polish or wax that makes floors slippery. If you must use wax, use non-skid floor wax.  Do not have throw rugs and other things on the floor that can make you trip. What can I do with my stairs?  Do not leave any items on the stairs.  Make sure that there are handrails on both sides of the stairs and use them. Fix handrails that are broken or loose. Make sure that handrails are as long as the stairways.  Check any carpeting to make sure that it is firmly attached to the stairs. Fix any carpet that is loose or worn.  Avoid having throw rugs at the top or bottom of the stairs. If you do have throw rugs, attach them to the floor with carpet tape.  Make sure that you have a light switch at the top of the stairs and the  bottom of the stairs. If you do not have them, ask someone to add them for you. What else can I do to help prevent falls?  Wear shoes that:  Do not have high heels.  Have rubber bottoms.  Are comfortable and fit you well.  Are closed at the toe. Do not wear sandals.  If you use a stepladder:  Make sure that it is fully opened. Do not climb a closed stepladder.  Make sure that both sides of the stepladder are locked into place.  Ask someone to hold it for you, if possible.  Clearly mark and make sure that you can see:  Any grab bars or handrails.  First and last steps.  Where the edge of each step is.  Use tools that help you move around (mobility aids) if they are needed. These include:  Canes.  Walkers.  Scooters.  Crutches.  Turn on the lights when you go into a dark area. Replace any light bulbs as soon as they burn out.  Set up your furniture so you have a clear path. Avoid moving your furniture around.  If any of your floors are uneven, fix them.  If there are any pets around you, be aware of where they are.  Review your medicines with your doctor. Some medicines can make you feel dizzy. This can increase your chance of falling. Ask your doctor what other things that you can do to help prevent falls. This information is not intended to replace advice given to you by your health care provider. Make sure you discuss any questions you have with your health care provider. Document Released: 01/31/2009 Document Revised: 09/12/2015 Document Reviewed: 05/11/2014 Elsevier Interactive Patient Education  2017 [employer].

## 2020-09-11 NOTE — Progress Notes (Signed)
Subjective:   ANTAWAN MCHUGH is a 65 y.o. male who presents for Medicare Annual/Subsequent preventive examination.  Virtual Visit via Telephone Note  I connected with  Bacilio Abascal Dehner on 09/11/20 at  3:30 PM EDT by telephone and verified that I am speaking with the correct person using two identifiers.  Location: Patient: Home Provider: [facility] Persons participating in the virtual visit: patient/Nurse Health Advisor   I discussed the limitations, risks, security and privacy concerns of performing an evaluation and management service by telephone and the availability of in person appointments. The patient expressed understanding and agreed to proceed.  Interactive audio and video telecommunications were attempted between this nurse and patient, however failed, due to patient having technical difficulties OR patient did not have access to video capability.  We continued and completed visit with audio only.  Some vital signs may be absent or patient reported.   Ashana Tullo E Ector Laurel, LPN   Review of Systems     Cardiac Risk Factors include: advanced age (>20men, >75 women);obesity (BMI >30kg/m2);sedentary lifestyle;dyslipidemia;hypertension;male gender;smoking/ tobacco exposure     Objective:    Today's Vitals   09/11/20 1430  Weight: 258 lb (117 kg)  Height: 6\' 1"  (1.854 m)   Body mass index is 34.04 kg/m.  Advanced Directives 09/11/2020 05/31/2020 08/23/2019 08/22/2018 06/27/2018 06/27/2018 03/08/2018  Does Patient Have a Medical Advance Directive? Yes No No No No No No  Type of Paramedic of [place];Living will - - - - - -  Copy of [place] in Chart? No - copy requested - - - - - -  Would patient like information on creating a medical advance directive? - - No - Patient declined Yes (MAU/Ambulatory/Procedural Areas - Information given) No - Patient declined - No - Patient declined    Current Medications (verified) Outpatient Encounter Medications  as of 09/11/2020  Medication Sig  . acetaminophen (TYLENOL) 325 MG tablet Take 2 tablets (650 mg total) by mouth every 6 (six) hours as needed for mild pain or headache.  . albuterol (PROAIR HFA) 108 (90 Base) MCG/ACT inhaler 1-2 puffs every 6 hours as needed wheezing or shortness of breath.  [name] aspirin EC 81 MG tablet Take 1 tablet (81 mg total) by mouth daily.  [name] atorvastatin (LIPITOR) 40 MG tablet TAKE 1 TABLET ONCE DAILY AT 6PM  . buPROPion (WELLBUTRIN SR) 150 MG 12 hr tablet Take 1 tablet (150 mg total) by mouth daily with breakfast.  . butalbital-acetaminophen-caffeine (ESGIC) 50-325-40 MG tablet Take 1 tablet by mouth every 6 (six) hours as needed for headache.  . celecoxib (CELEBREX) 200 MG capsule Take 1 capsule (200 mg total) by mouth daily as needed.  . cetaphil (CETAPHIL) lotion Apply 1 application topically 2 (two) times daily. For rash. Apply after washing with [facility] shower gel and rinsing with clear warm water.  . DULoxetine (CYMBALTA) 60 MG capsule Take 1 capsule (60 mg total) by mouth daily. WITH A FULL STOMACH AT SUPPER TIME  . fexofenadine (ALLEGRA) 180 MG tablet Take 1 tablet (180 mg total) by mouth daily. For allergy symptoms  . fluocinonide-emollient (LIDEX-E) 0.05 % cream Apply 1 application topically 2 (two) times daily. To affected areas  . fluticasone (FLONASE) 50 MCG/ACT nasal spray Place 2 sprays into both nostrils daily as needed for allergies or rhinitis.  . fluticasone furoate-vilanterol (BREO ELLIPTA) 100-25 MCG/INH AEPB Inhale 1 puff into the lungs daily.  . Ipratropium-Albuterol (COMBIVENT RESPIMAT) 20-100 MCG/ACT AERS respimat Inhale 1  puff into the lungs every 6 (six) hours  . isosorbide mononitrate (IMDUR) 60 MG 24 hr tablet Take 1 tablet (60 mg total) by mouth daily.  . metoprolol tartrate (LOPRESSOR) 25 MG tablet Take 1 tablet (25 mg total) by mouth 2 (two) times daily.  . mirtazapine (REMERON) 15 MG tablet Take 1 tablet (15 mg total) by mouth at bedtime.  For sleep  . nitroGLYCERIN (NITROSTAT) 0.4 MG SL tablet Place 1 tablet (0.4 mg total) under the tongue every 5 (five) minutes x 3 doses as needed for chest pain.  . pantoprazole (PROTONIX) 40 MG tablet TAKE  (1)  TABLET TWICE A DAY.  . tamsulosin (FLOMAX) 0.4 MG CAPS capsule Take 2 capsules (0.8 mg total) by mouth daily after supper.   Facility-Administered Encounter Medications as of 09/11/2020  Medication  . cyanocobalamin ((VITAMIN B-12)) injection 1,000 mcg    Allergies (verified) Chantix [varenicline tartrate]   History: Past Medical History:  Diagnosis Date  . Cerebral palsy ([place])   . Coronary artery disease    LAD 70% stenosis, cath 2019  . Depression   . GERD (gastroesophageal reflux disease)   . Hypertension   . Scoliosis   . Seizures ([place])    pt's sister states that patinet has had seizures in the past, pt camr for PAT by himself on last visit and she stst "they misunderstood what he was trying to say. Pt saw neurologist in March who did CT and states patient does not have seizures. On no meds, had seizures as child.   Past Surgical History:  Procedure Laterality Date  . CARPAL TUNNEL RELEASE Right 08/13/2016   Procedure: CARPAL TUNNEL RELEASE;  Surgeon: Carole Civil, MD;  Location: [facility];  Service: Orthopedics;  Laterality: Right;  . CORONARY ANGIOPLASTY WITH STENT PLACEMENT    . LEFT HEART CATH AND CORONARY ANGIOGRAPHY N/A 07/10/2017   Procedure: LEFT HEART CATH AND CORONARY ANGIOGRAPHY;  Surgeon: Troy Sine, MD;  Location: [place] [facility];  Service: Cardiovascular;  Laterality: N/A;   Family History  Problem Relation Age of Onset  . CAD Father 72       CABG  . Heart disease Father   . CAD Brother   . Diabetes Mother   . Alcohol abuse Brother   . CAD Sister   . Heart failure Sister    Social History   Socioeconomic History  . Marital status: Single    Spouse name: Not on file  . Number of children: Not on file  . Years of education: 50  .  Highest education level: High school graduate  Occupational History  . Occupation: disabled  Tobacco Use  . Smoking status: Former Smoker    Packs/day: 0.50    Years: 41.00    Pack years: 20.50    Types: Cigarettes    Quit date: 06/06/2018    Years since quitting: 2.2  . Smokeless tobacco: Never Used  . Tobacco comment: Patient quit on his own in February 2020  Vaping Use  . Vaping Use: Never used  Substance and Sexual Activity  . Alcohol use: Yes    Comment: "once in a blue moon" typically drinks beer 2 weeks or longer  . Drug use: No  . Sexual activity: Never    Birth control/protection: None  Other Topics Concern  . Not on file  Social History Narrative   08/05/2018   Patient lives alone in a trailer. His sister [name] will take take him to the grocery store  or bring things by for him. He usually sees her twice a week. His "baby sister" also checks in on him. He is able to perform most ADLs but is illiterate. He does not drive and depends on family or arranged transportation - usually [name]. States that his landlord and his wife are nice and lookout for him. He has an aide that comes 5 days per week who helps him bathe, shop, pay bills, etc.   Social Determinants of Health   Financial Resource Strain: Low Risk   . Difficulty of Paying Living Expenses: Not hard at all  Food Insecurity: No Food Insecurity  . Worried About Charity fundraiser in the Last Year: Never true  . Ran Out of Food in the Last Year: Never true  Transportation Needs: No Transportation Needs  . Lack of Transportation (Medical): No  . Lack of Transportation (Non-Medical): No  Physical Activity: Sufficiently Active  . Days of Exercise per Week: 5 days  . Minutes of Exercise per Session: 30 min  Stress: No Stress Concern Present  . Feeling of Stress : Not at all  Social Connections: Socially Isolated  . Frequency of Communication with Friends and Family: More than three times a week  . Frequency of Social  Gatherings with Friends and Family: More than three times a week  . Attends Religious Services: Never  . Active Member of Clubs or Organizations: No  . Attends Archivist Meetings: Never  . Marital Status: Never married    Tobacco Counseling Counseling given: Not Answered Comment: Patient quit on his own in February 2020   Clinical Intake:  Pre-visit preparation completed: Yes  Pain : No/denies pain     BMI - recorded: 34.04 Nutritional Status: BMI > 30  Obese Nutritional Risks: None Diabetes: No  How often do you need to have someone help you when you read instructions, pamphlets, or other written materials from your doctor or pharmacy?: 3 - Sometimes  Diabetic? No  Interpreter Needed?: No  Information entered by :: Tonnette Zwiebel, LPN   Activities of Daily Living In your present state of health, do you have any difficulty performing the following activities: 09/11/2020  Hearing? N  Vision? N  Difficulty concentrating or making decisions? Y  Walking or climbing stairs? Y  Dressing or bathing? Y  Doing errands, shopping? Y  Preparing Food and eating ? Y  Using the Toilet? N  In the past six months, have you accidently leaked urine? N  Do you have problems with loss of bowel control? N  Managing your Medications? Y  Managing your Finances? Y  Housekeeping or managing your Housekeeping? Y  Some recent data might be hidden    Patient Care Team: [name], MD as PCP - General (Family Medicine) Minus Breeding, MD as PCP - Cardiology (Cardiology) Shea Evans [name], [name] as Social Worker (Licensed Clinical Social Worker) [name], RN as Case Manager  Indicate any recent [place] you may have received from other than [facility] providers in the past year (date may be approximate).     Assessment:   This is a routine wellness examination for Jeremih.  Hearing/Vision screen  Hearing Screening   125Hz  250Hz  500Hz  1000Hz  2000Hz  3000Hz  4000Hz   6000Hz  8000Hz   Right ear:           Left ear:           Comments: Denies hearing difficulties  Vision Screening Comments: Wears glasses sometimes - Annual visits with Dr  Le in [place]  Dietary issues and exercise activities discussed: Current Exercise Habits: Home exercise routine, Type of exercise: stretching;walking, Time (Minutes): 20, Frequency (Times/Week): 7, Weekly Exercise (Minutes/Week): 140, Intensity: Mild, Exercise limited by: orthopedic condition(s);neurologic condition(s)  Goals Addressed            This Visit's Progress   . DIET - DECREASE SODA OR JUICE INTAKE   On track    Reduce soda intake to 1 serving per day.  Aim for 4 bottles of water a day      Depression Screen PHQ 2/9 Scores 09/11/2020 09/02/2020 05/09/2020 03/28/2020 02/06/2020 08/23/2019 08/16/2019  PHQ - 2 Score 0 0 0 0 0 0 0  PHQ- 9 Score 5 6 - - - - -    Fall Risk Fall Risk  09/11/2020 05/09/2020 03/28/2020 02/06/2020 12/20/2019  Falls in the past year? 1 1 1 1 1   Number falls in past yr: 1 1 1 1 1   Injury with Fall? 0 0 0 0 0  Comment - - - - -  Risk for fall due to : Impaired balance/gait;History of fall(s);Orthopedic patient;Impaired vision;Medication side effect History of fall(s);Impaired balance/gait;Impaired mobility History of fall(s);Impaired balance/gait;Impaired mobility History of fall(s);Impaired balance/gait;Impaired mobility History of fall(s);Impaired balance/gait  Follow up Education provided;Falls prevention discussed Falls evaluation completed Falls evaluation completed Falls evaluation completed Falls evaluation completed    FALL RISK PREVENTION PERTAINING TO THE HOME:  Any stairs in or around the home? Yes  If so, are there any without handrails? No  Home free of loose throw rugs in walkways, pet beds, electrical cords, etc? Yes  Adequate lighting in your home to reduce risk of falls? Yes   ASSISTIVE DEVICES UTILIZED TO PREVENT FALLS:  Life alert? No  Use of a cane, walker or w/c?  Yes  Grab bars in the bathroom? Yes  Shower chair or bench in shower? No  Elevated toilet seat or a handicapped toilet? No   TIMED UP AND GO:  Was the test performed? No . Telephonic visit.  Cognitive Function: Cognitive status assessed by direct observation. Patient has current diagnosis of cognitive impairment. Patient is unable to complete screening 6CIT or MMSE.   MMSE - Mini Mental State Exam 03/08/2018  Not completed: Unable to complete     6CIT Screen 08/23/2019 08/22/2018  What Year? 0 points 0 points  What month? 0 points 3 points  What time? 0 points 0 points  Count back from 20 4 points 4 points  Months in reverse 4 points 4 points  Repeat phrase 2 points 2 points  Total Score 10 13    Immunizations Immunization History  Administered Date(s) Administered  . Influenza,inj,Quad PF,6+ Mos 02/12/2014, 02/26/2015, 05/01/2016, 02/09/2017, 02/07/2018, 01/24/2019, 02/06/2020  . Moderna Sars-Covid-2 Vaccination 05/15/2020, 06/12/2020  . Pneumococcal Conjugate-13 02/07/2018  . Tdap 12/05/2011    TDAP status: Up to date  Flu Vaccine status: Up to date  Pneumococcal vaccine status: Due, Education has been provided regarding the importance of this vaccine. Advised may receive this vaccine at local pharmacy or Health Dept. Aware to provide a copy of the vaccination record if obtained from local pharmacy or Health Dept. Verbalized acceptance and understanding.  Covid-19 vaccine status: Completed vaccines  Qualifies for Shingles Vaccine? Yes   Zostavax completed No   Shingrix Completed?: No.    Education has been provided regarding the importance of this vaccine. Patient has been advised to call insurance company to determine out of pocket expense if they have not  yet received this vaccine. Advised may also receive vaccine at local pharmacy or Health Dept. Verbalized acceptance and understanding.  Screening Tests Health Maintenance  Topic Date Due  . COVID-19 Vaccine (3 -  Booster for Moderna series) 11/09/2020  . INFLUENZA VACCINE  11/18/2020  . TETANUS/TDAP  12/04/2021  . COLONOSCOPY (Pts 45-36yrs Insurance coverage will need to be confirmed)  04/18/2025  . Hepatitis C Screening  Completed  . HIV Screening  Completed  . HPV VACCINES  Aged Out    Health Maintenance  There are no preventive care reminders to display for this patient.  Colorectal cancer screening: Type of screening: Colonoscopy. Completed 04/19/2015. Repeat every 10 years  Lung Cancer Screening: (Low Dose CT Chest recommended if Age 51-80 years, 30 pack-year currently smoking OR have quit w/in 15years.) does not qualify.   Additional Screening:  Hepatitis C Screening: does qualify; Completed 02/26/2015  Vision Screening: Recommended annual ophthalmology exams for early detection of glaucoma and other disorders of the eye. Is the patient up to date with their annual eye exam?  Yes  Who is the provider or what is the name of the office in which the patient attends annual eye exams? Dr Marin Comment in [place] If pt is not established with a provider, would they like to be referred to a provider to establish care? No .   Dental Screening: Recommended annual dental exams for proper oral hygiene  Community Resource Referral / Chronic Care Management: CRR required this visit?  No   CCM required this visit?  No      Plan:     I have personally reviewed and noted the following in the patient's chart:   . Medical and social history . Use of alcohol, tobacco or illicit drugs  . Current medications and supplements including opioid prescriptions. Patient is not currently taking opioid prescriptions. . Functional ability and status . Nutritional status . Physical activity . Advanced directives . List of other physicians . Hospitalizations, surgeries, and ER visits in previous 12 months . Vitals . Screenings to include cognitive, depression, and falls . Referrals and appointments  In  addition, I have reviewed and discussed with patient certain preventive protocols, quality metrics, and best practice recommendations. A written personalized care plan for preventive services as well as general preventive health recommendations were provided to patient.     Sandrea Hammond, LPN   07/20/270   Nurse Notes: None

## 2020-09-11 NOTE — Progress Notes (Signed)
Vitamin b12 injection given and patient tolerated well.  

## 2020-09-24 ENCOUNTER — Ambulatory Visit (INDEPENDENT_AMBULATORY_CARE_PROVIDER_SITE_OTHER): Payer: Medicare Other | Admitting: Licensed Clinical Social Worker

## 2020-09-24 DIAGNOSIS — K219 Gastro-esophageal reflux disease without esophagitis: Secondary | ICD-10-CM

## 2020-09-24 DIAGNOSIS — F32A Depression, unspecified: Secondary | ICD-10-CM

## 2020-09-24 DIAGNOSIS — J441 Chronic obstructive pulmonary disease with (acute) exacerbation: Secondary | ICD-10-CM

## 2020-09-24 DIAGNOSIS — G809 Cerebral palsy, unspecified: Secondary | ICD-10-CM

## 2020-09-24 DIAGNOSIS — M5136 Other intervertebral disc degeneration, lumbar region: Secondary | ICD-10-CM

## 2020-09-24 DIAGNOSIS — R482 Apraxia: Secondary | ICD-10-CM

## 2020-09-24 DIAGNOSIS — I25118 Atherosclerotic heart disease of native coronary artery with other forms of angina pectoris: Secondary | ICD-10-CM

## 2020-09-24 DIAGNOSIS — M51369 Other intervertebral disc degeneration, lumbar region without mention of lumbar back pain or lower extremity pain: Secondary | ICD-10-CM

## 2020-09-24 DIAGNOSIS — I10 Essential (primary) hypertension: Secondary | ICD-10-CM

## 2020-09-24 DIAGNOSIS — F411 Generalized anxiety disorder: Secondary | ICD-10-CM

## 2020-09-24 DIAGNOSIS — E538 Deficiency of other specified B group vitamins: Secondary | ICD-10-CM

## 2020-09-24 NOTE — Chronic Care Management (AMB) (Signed)
Chronic Care Management    Clinical Social Work Note  09/24/2020 Name: [name] MRN: [id number] DOB: 11/08/1955  [name] is a 65 y.o. year old male who is a primary care patient of [name], [name], [name]. The CCM team was consulted to assist the patient with chronic disease management and/or care coordination needs related to: [name] .   Engaged with patient by telephone for follow up visit in response to provider referral for social work chronic care management and care coordination services.   Consent to Services:  The patient was given information about Chronic Care Management services, agreed to services, and gave verbal consent prior to initiation of services.  Please see initial visit note for detailed documentation.   Patient agreed to services and consent obtained.   Assessment: Review of patient past medical history, allergies, medications, and health status, including review of relevant consultants reports was performed today as part of a comprehensive evaluation and provision of chronic care management and care coordination services.     SDOH (Social Determinants of Health) assessments and interventions performed:  SDOH Interventions   Flowsheet Row Most Recent Value  SDOH Interventions   Depression Interventions/Treatment  Currently on Treatment       Advanced Directives Status: See Vynca application for related entries.  CCM Care Plan  Allergies  Allergen Reactions  . Chantix [Varenicline Tartrate] Nausea And Vomiting    Outpatient Encounter Medications as of 09/24/2020  Medication Sig  . acetaminophen (TYLENOL) 325 MG tablet Take 2 tablets (650 mg total) by mouth every 6 (six) hours as needed for mild pain or headache.  . albuterol (PROAIR HFA) 108 (90 Base) MCG/ACT inhaler 1-2 puffs every 6 hours as needed wheezing or shortness of breath.  [name] aspirin EC 81 MG tablet Take 1 tablet (81 mg total) by mouth daily.  [name] atorvastatin (LIPITOR) 40 MG  tablet TAKE 1 TABLET ONCE DAILY AT 6PM  . buPROPion (WELLBUTRIN SR) 150 MG 12 hr tablet Take 1 tablet (150 mg total) by mouth daily with breakfast.  . butalbital-acetaminophen-caffeine (ESGIC) 50-325-40 MG tablet Take 1 tablet by mouth every 6 (six) hours as needed for headache.  . celecoxib (CELEBREX) 200 MG capsule Take 1 capsule (200 mg total) by mouth daily as needed.  . cetaphil (CETAPHIL) lotion Apply 1 application topically 2 (two) times daily. For rash. Apply after washing with [facility] shower gel and rinsing with clear warm water.  . DULoxetine (CYMBALTA) 60 MG capsule Take 1 capsule (60 mg total) by mouth daily. WITH A FULL STOMACH AT SUPPER TIME  . fexofenadine (ALLEGRA) 180 MG tablet Take 1 tablet (180 mg total) by mouth daily. For allergy symptoms  . fluocinonide-emollient (LIDEX-E) 0.05 % cream Apply 1 application topically 2 (two) times daily. To affected areas  . fluticasone (FLONASE) 50 MCG/ACT nasal spray Place 2 sprays into both nostrils daily as needed for allergies or rhinitis.  . fluticasone furoate-vilanterol (BREO ELLIPTA) 100-25 MCG/INH AEPB Inhale 1 puff into the lungs daily.  . Ipratropium-Albuterol (COMBIVENT RESPIMAT) 20-100 MCG/ACT AERS respimat Inhale 1 puff into the lungs every 6 (six) hours  . isosorbide mononitrate (IMDUR) 60 MG 24 hr tablet Take 1 tablet (60 mg total) by mouth daily.  . metoprolol tartrate (LOPRESSOR) 25 MG tablet Take 1 tablet (25 mg total) by mouth 2 (two) times daily.  . mirtazapine (REMERON) 15 MG tablet Take 1 tablet (15 mg total) by mouth at bedtime. For sleep  . nitroGLYCERIN (NITROSTAT) 0.4 MG SL  tablet Place 1 tablet (0.4 mg total) under the tongue every 5 (five) minutes x 3 doses as needed for chest pain.  . pantoprazole (PROTONIX) 40 MG tablet TAKE  (1)  TABLET TWICE A DAY.  . tamsulosin (FLOMAX) 0.4 MG CAPS capsule Take 2 capsules (0.8 mg total) by mouth daily after supper.   Facility-Administered Encounter Medications as of  09/24/2020  Medication  . cyanocobalamin ((VITAMIN B-12)) injection 1,000 mcg    Patient Active Problem List   Diagnosis Date Noted  . Degenerative disc disease, lumbar 01/16/2019  . Depression   . Hypokalemia 06/27/2018  . Seizure disorder ([place]) 02/07/2018  . Apraxia of speech 10/13/2017  . Left-sided weakness 10/13/2017  . Coronary artery disease of native artery of native heart with stable angina pectoris ([place])   . Illiteracy 10/23/2016  . Carpal tunnel syndrome of right wrist 06/15/2016  . GAD (generalized anxiety disorder) 05/01/2016  . History of MI (myocardial infarction) 02/26/2015  . Dyslipidemia, goal LDL below 70 10/08/2009  . Infantile cerebral palsy ([place]) 10/08/2009  . Essential hypertension 10/08/2009    Conditions to be addressed/monitored: Monitor client completion of ADLs as able. Monitor client mobility issues and pain issues  Care Plan : [name] care plan  Updates made by James Cabal, [name] since 09/24/2020 12:00 AM    Problem: Coping Skills (General Plan of Care)     Goal: Coping Skills Enhanced; Complete ADLs daily , as able   Start Date: 09/24/2020  Expected End Date: 12/25/2020  This Visit's Progress: On track  Priority: Medium  Note:   Current barriers:   . Patient in need of assistance with connecting to community resources for possible help in completing ADLs . Patient is unable to independently navigate community resource options without care coordination support . Mobility challenges . Pain issues  Clinical Goals:  patient will work with SW monthly to address concerns related to client completion of ADLs Patient will call RNCM or [name] monthly as needed for CCM support  Clinical Interventions:  . Collaboration with James Fraise, [name] regarding development and update of comprehensive plan of care as evidenced by provider attestation and co-signature . Talked with client about current client needs . Informed client of transport arrangements with  [name] for transport for client to and from [facility] on 10/11/20 for B12 injection . Talked with client about mobility of client and pain issues of client . Talked with client about sleep issues of client . Talked with client about food procurement of client . Talked with client about financial needs of client (client said that after he pays his monthly bills he does not have much money left over to buy other items) . Talked with client about lift chair (he said he cannot afford to buy new lift chair; he said he talked with a friend who had a used lift chair for a reasonable price. He said he may be able to try to save a little to see if he can purchase used lift-chair.  He did say his cable bill is $ 160.00 per month) . Talked with client about family support . Talked with client about support from home health aide . Encouraged client to contact RNCM as needed for nursing support . Collaborated with RNCM about current client needs  Patient Coping Skills: Attends scheduled medical appointments Takes medications as prescribed Has some family support  Patient Deficits:   Memory challenges Pain issues Mobility issues Financial challenges   Patient Goals: over next 30 days, patient  will: Attend scheduled medical appointments Take medications as prescribed Cooperate with home health aide assisting client , as scheduled, in client home Call [facility] as needed for nursing support  Follow Up Plan: [name] to call client on 11/07/20     Woodley Petzold.Chanita Boden MSW, [name] Licensed Clinical Social Worker [name] Care Management [phone]

## 2020-09-24 NOTE — Patient Instructions (Signed)
Visit Information  PATIENT GOALS: Goals Addressed            This Visit's Progress   . Increase Personal Care Services Hours; Receive in home care weekly as needed through home health agency       Timeframe:  Short-Term Goal Priority:  Medium Progress: On Track Start Date:             09/24/20                Expected End Date:           12/25/20            Follow-up: 11/07/20  Patient Self Care Activities:  . Self administers medications as prescribed . Attends all scheduled provider appointments . Performs ADL's independently  Patient Coping Strengths:  . Family  Patient Self Care Deficits:  [name] Mobility issues . Needs some help occasionally with in home care needs  Patient Goals:  - spend time or talk with others at least 2 to 3 times per week - practice relaxation or meditation daily - keep a calendar with appointment dates  Follow Up Plan: [name] to call client on 11/07/20       Charvez Voorhies.Oneita Allmon MSW, [name] Licensed Clinical Social Worker [facility] Care Management [phone]

## 2020-10-07 ENCOUNTER — Telehealth: Payer: Medicare Other

## 2020-10-11 ENCOUNTER — Other Ambulatory Visit: Payer: Self-pay

## 2020-10-11 ENCOUNTER — Ambulatory Visit (INDEPENDENT_AMBULATORY_CARE_PROVIDER_SITE_OTHER): Payer: Medicare Other | Admitting: *Deleted

## 2020-10-11 DIAGNOSIS — E538 Deficiency of other specified B group vitamins: Secondary | ICD-10-CM

## 2020-10-17 ENCOUNTER — Telehealth: Payer: Medicare Other | Admitting: *Deleted

## 2020-10-31 ENCOUNTER — Encounter: Payer: Self-pay | Admitting: Nurse Practitioner

## 2020-10-31 ENCOUNTER — Other Ambulatory Visit: Payer: Self-pay

## 2020-10-31 ENCOUNTER — Ambulatory Visit (INDEPENDENT_AMBULATORY_CARE_PROVIDER_SITE_OTHER): Payer: Medicare Other | Admitting: Nurse Practitioner

## 2020-10-31 VITALS — BP 114/64 | HR 74 | Temp 98.0°F | Ht 73.0 in | Wt 256.0 lb

## 2020-10-31 DIAGNOSIS — M5489 Other dorsalgia: Secondary | ICD-10-CM | POA: Insufficient documentation

## 2020-10-31 MED ORDER — DICLOFENAC SODIUM 75 MG PO TBEC: 75.0000 mg | DELAYED_RELEASE_TABLET | Freq: Two times a day (BID) | ORAL | 0 refills | Status: DC

## 2020-10-31 MED ORDER — CYCLOBENZAPRINE HCL 5 MG PO TABS: 5.0000 mg | ORAL_TABLET | Freq: Three times a day (TID) | ORAL | 0 refills | Status: DC | PRN

## 2020-10-31 NOTE — Patient Instructions (Signed)
Acute Back Pain, Adult Acute back pain is sudden and usually short-lived. It is often caused by an injury to the muscles and tissues in the back. The injury may result from: A muscle or ligament getting overstretched or torn (strained). Ligaments are tissues that connect bones to each other. Lifting something improperly can cause a back strain. Wear and tear (degeneration) of the spinal disks. Spinal disks are circular tissue that provide cushioning between the bones of the spine (vertebrae). Twisting motions, such as while playing sports or doing yard work. A hit to the back. Arthritis. You may have a physical exam, lab tests, and imaging tests to find the cause ofyour pain. Acute back pain usually goes away with rest and home care. Follow these instructions at home: Managing pain, stiffness, and swelling Treatment may include medicines for pain and inflammation that are taken by mouth or applied to the skin, prescription pain medicine, or muscle relaxants. Take over-the-counter and prescription medicines only as told by your health care provider. Your health care provider may recommend applying ice during the first 24-48 hours after your pain starts. To do this: Put ice in a plastic bag. Place a towel between your skin and the bag. Leave the ice on for 20 minutes, 2-3 times a day. If directed, apply heat to the affected area as often as told by your health care provider. Use the heat source that your health care provider recommends, such as a moist heat pack or a heating pad. Place a towel between your skin and the heat source. Leave the heat on for 20-30 minutes. Remove the heat if your skin turns bright red. This is especially important if you are unable to feel pain, heat, or cold. You have a greater risk of getting burned. Activity  Do not stay in bed. Staying in bed for more than 1-2 days can delay your recovery. Sit up and stand up straight. Avoid leaning forward when you sit or  hunching over when you stand. If you work at a desk, sit close to it so you do not need to lean over. Keep your chin tucked in. Keep your neck drawn back, and keep your elbows bent at a 90-degree angle (right angle). Sit high and close to the steering wheel when you drive. Add lower back (lumbar) support to your car seat, if needed. Take short walks on even surfaces as soon as you are able. Try to increase the length of time you walk each day. Do not sit, drive, or stand in one place for more than 30 minutes at a time. Sitting or standing for long periods of time can put stress on your back. Do not drive or use heavy machinery while taking prescription pain medicine. Use proper lifting techniques. When you bend and lift, use positions that put less stress on your back: Bend your knees. Keep the load close to your body. Avoid twisting. Exercise regularly as told by your health care provider. Exercising helps your back heal faster and helps prevent back injuries by keeping muscles strong and flexible. Work with a physical therapist to make a safe exercise program, as recommended by your health care provider. Do any exercises as told by your physical therapist.  Lifestyle Maintain a healthy weight. Extra weight puts stress on your back and makes it difficult to have good posture. Avoid activities or situations that make you feel anxious or stressed. Stress and anxiety increase muscle tension and can make back pain worse. Learn ways to manage   anxiety and stress, such as through exercise. General instructions Sleep on a firm mattress in a comfortable position. Try lying on your side with your knees slightly bent. If you lie on your back, put a pillow under your knees. Follow your treatment plan as told by your health care provider. This may include: Cognitive or behavioral therapy. Acupuncture or massage therapy. Meditation or yoga. Contact a health care provider if: You have pain that is not  relieved with rest or medicine. You have increasing pain going down into your legs or buttocks. Your pain does not improve after 2 weeks. You have pain at night. You lose weight without trying. You have a fever or chills. Get help right away if: You develop new bowel or bladder control problems. You have unusual weakness or numbness in your arms or legs. You develop nausea or vomiting. You develop abdominal pain. You feel faint. Summary Acute back pain is sudden and usually short-lived. Use proper lifting techniques. When you bend and lift, use positions that put less stress on your back. Take over-the-counter and prescription medicines and apply heat or ice as directed by your health care provider. This information is not intended to replace advice given to you by your health care provider. Make sure you discuss any questions you have with your healthcare provider. Document Revised: 12/26/2019 Document Reviewed: 12/29/2019 Elsevier Patient Education  2022 Elsevier Inc.  

## 2020-10-31 NOTE — Assessment & Plan Note (Signed)
Back pain started when a cabinet fell over patient about 1 week ago, started patient on Voltaren 75 mg tablet by mouth and flexeril 5 mg tablet by mouth 3 times a day as needed.  Patient knows to follow up with worsening or unresolved symptoms  RX sent to pharmacy

## 2020-10-31 NOTE — Progress Notes (Signed)
Acute Office Visit  Subjective:    Patient ID: [name], male    DOB: 1956-04-12, 65 y.o.   MRN: [id number]  Chief Complaint  Patient presents with   Back Pain    Back Pain  Patient is in today for Back Pain  He reports new onset back pain. There was an injury that may have caused the pain. The most recent episode started a few days ago and is staying constant. The pain is located in the lower backwithout radiation. It is described as aching, is moderate in intensity, occurring constantly. Symptoms are worse in the: morning, mid-day, afternoon, evening, nighttime  Aggravating factors: bending backwards, bending forwards, and walking Relieving factors: none.  He has tried application of heat with no relief.   Associated symptoms: No abdominal pain No bowel incontinence  No chest pain No dysuria   No fever No headaches  No joint pains No pelvic pain  No weakness in leg  No tingling in lower extremities  No urinary incontinence No weight loss      Past Medical History:  Diagnosis Date   Cerebral palsy ([place])    Coronary artery disease    LAD 70% stenosis, cath 2019   Depression    GERD (gastroesophageal reflux disease)    Hypertension    Scoliosis    Seizures ([place])    pt's sister states that patinet has had seizures in the past, pt camr for PAT by himself on last visit and she stst "they misunderstood what he was trying to say. Pt saw neurologist in March who did CT and states patient does not have seizures. On no meds, had seizures as child.    Past Surgical History:  Procedure Laterality Date   CARPAL TUNNEL RELEASE Right 08/13/2016   Procedure: CARPAL TUNNEL RELEASE;  Surgeon: Carole Civil, MD;  Location: [facility];  Service: Orthopedics;  Laterality: Right;   CORONARY ANGIOPLASTY WITH STENT PLACEMENT     LEFT HEART CATH AND CORONARY ANGIOGRAPHY N/A 07/10/2017   Procedure: LEFT HEART CATH AND CORONARY ANGIOGRAPHY;  Surgeon: Troy Sine, MD;  Location: [facility];  Service: Cardiovascular;  Laterality: N/A;    Family History  Problem Relation Age of Onset   CAD Father 48       CABG   Heart disease Father    CAD Brother    Diabetes Mother    Alcohol abuse Brother    CAD Sister    Heart failure Sister     Social History   Socioeconomic History   Marital status: Single    Spouse name: Not on file   Number of children: Not on file   Years of education: 12   Highest education level: High school graduate  Occupational History   Occupation: disabled  Tobacco Use   Smoking status: Former    Packs/day: 0.50    Years: 41.00    Pack years: 20.50    Types: Cigarettes    Quit date: 06/06/2018    Years since quitting: 2.4   Smokeless tobacco: Never   Tobacco comments:    Patient quit on his own in February 2020  Vaping Use   Vaping Use: Never used  Substance and Sexual Activity   Alcohol use: Yes    Comment: "once in a blue moon" typically drinks beer 2 weeks or longer   Drug use: No   Sexual activity: Never    Birth control/protection: None  Other Topics Concern  Not on file  Social History Narrative   08/05/2018   Patient lives alone in a trailer. His sister [name] will take take him to the grocery store or bring things by for him. He usually sees her twice a week. His "baby sister" also checks in on him. He is able to perform most ADLs but is illiterate. He does not drive and depends on family or arranged transportation - usually [name]. States that his landlord and his wife are nice and lookout for him. He has an aide that comes 5 days per week who helps him bathe, shop, pay bills, etc.   Social Determinants of Health   Financial Resource Strain: Low Risk    Difficulty of Paying Living Expenses: Not hard at all  Food Insecurity: No Food Insecurity   Worried About Charity fundraiser in the Last Year: Never true   [place] in the Last Year: Never true  Transportation Needs: No Transportation Needs   Lack of  Transportation (Medical): No   Lack of Transportation (Non-Medical): No  Physical Activity: Sufficiently Active   Days of Exercise per Week: 5 days   Minutes of Exercise per Session: 30 min  Stress: No Stress Concern Present   Feeling of Stress : Not at all  Social Connections: Socially Isolated   Frequency of Communication with Friends and Family: More than three times a week   Frequency of Social Gatherings with Friends and Family: More than three times a week   Attends Religious Services: Never   Marine scientist or Organizations: No   Attends Music therapist: Never   Marital Status: Never married  Human resources officer Violence: Not At Risk   Fear of Current or Ex-Partner: No   Emotionally Abused: No   Physically Abused: No   Sexually Abused: No    Outpatient Medications Prior to Visit  Medication Sig Dispense Refill   acetaminophen (TYLENOL) 325 MG tablet Take 2 tablets (650 mg total) by mouth every 6 (six) hours as needed for mild pain or headache.     albuterol (PROAIR HFA) 108 (90 Base) MCG/ACT inhaler 1-2 puffs every 6 hours as needed wheezing or shortness of breath. 17 g 10   aspirin EC 81 MG tablet Take 1 tablet (81 mg total) by mouth daily. 30 tablet 11   atorvastatin (LIPITOR) 40 MG tablet TAKE 1 TABLET ONCE DAILY AT 6PM 90 tablet 1   buPROPion (WELLBUTRIN SR) 150 MG 12 hr tablet Take 1 tablet (150 mg total) by mouth daily with breakfast. 90 tablet 1   celecoxib (CELEBREX) 200 MG capsule Take 1 capsule (200 mg total) by mouth daily as needed. 90 capsule 1   cetaphil (CETAPHIL) lotion Apply 1 application topically 2 (two) times daily. For rash. Apply after washing with [facility] shower gel and rinsing with clear warm water. 473 mL 0   DULoxetine (CYMBALTA) 60 MG capsule Take 1 capsule (60 mg total) by mouth daily. WITH A FULL STOMACH AT SUPPER TIME 90 capsule 1   fexofenadine (ALLEGRA) 180 MG tablet Take 1 tablet (180 mg total) by mouth daily. For allergy  symptoms 90 tablet 2   fluocinonide-emollient (LIDEX-E) 0.05 % cream Apply 1 application topically 2 (two) times daily. To affected areas 60 g 5   fluticasone (FLONASE) 50 MCG/ACT nasal spray Place 2 sprays into both nostrils daily as needed for allergies or rhinitis. 48 g 3   fluticasone furoate-vilanterol (BREO ELLIPTA) 100-25 MCG/INH AEPB Inhale 1  puff into the lungs daily. 90 each 3   Ipratropium-Albuterol (COMBIVENT RESPIMAT) 20-100 MCG/ACT AERS respimat Inhale 1 puff into the lungs every 6 (six) hours 4 g 11   isosorbide mononitrate (IMDUR) 60 MG 24 hr tablet Take 1 tablet (60 mg total) by mouth daily. 90 tablet 3   metoprolol tartrate (LOPRESSOR) 25 MG tablet Take 1 tablet (25 mg total) by mouth 2 (two) times daily. 180 tablet 3   mirtazapine (REMERON) 15 MG tablet Take 1 tablet (15 mg total) by mouth at bedtime. For sleep 90 tablet 3   nitroGLYCERIN (NITROSTAT) 0.4 MG SL tablet Place 1 tablet (0.4 mg total) under the tongue every 5 (five) minutes x 3 doses as needed for chest pain. 25 tablet 11   pantoprazole (PROTONIX) 40 MG tablet TAKE  (1)  TABLET TWICE A DAY. 180 tablet 3   tamsulosin (FLOMAX) 0.4 MG CAPS capsule Take 2 capsules (0.8 mg total) by mouth daily after supper. 180 capsule 3   butalbital-acetaminophen-caffeine (ESGIC) 50-325-40 MG tablet Take 1 tablet by mouth every 6 (six) hours as needed for headache. 30 tablet 0   Facility-Administered Medications Prior to Visit  Medication Dose Route Frequency Provider Last Rate Last Admin   cyanocobalamin ((VITAMIN B-12)) injection 1,000 mcg  1,000 mcg Intramuscular Q30 days [name], MD   1,000 mcg at 10/11/20 1113    Allergies  Allergen Reactions   Chantix [Varenicline Tartrate] Nausea And Vomiting    Review of Systems  Constitutional: Negative.   HENT: Negative.    Eyes: Negative.   Respiratory: Negative.    Gastrointestinal: Negative.   Musculoskeletal:  Positive for back pain.  All other systems reviewed and are  negative.     Objective:    Physical Exam Vitals and nursing note reviewed.  Constitutional:      Appearance: Normal appearance.  HENT:     Head: Normocephalic.     Nose: Nose normal.  Eyes:     Conjunctiva/sclera: Conjunctivae normal.  Cardiovascular:     Pulses: Normal pulses.     Heart sounds: Normal heart sounds.  Pulmonary:     Effort: Pulmonary effort is normal.     Breath sounds: Normal breath sounds.  Abdominal:     General: Bowel sounds are normal.  Musculoskeletal:     Lumbar back: Tenderness present.  Neurological:     Mental Status: He is alert and oriented to person, place, and time.    BP 114/64   Pulse 74   Temp 98 F (36.7 C) (Temporal)   Ht 6\' 1"  (1.854 m)   Wt 256 lb (116.1 kg)   SpO2 94%   BMI 33.78 kg/m  Wt Readings from Last 3 Encounters:  10/31/20 256 lb (116.1 kg)  09/11/20 258 lb (117 kg)  07/17/20 258 lb (117 kg)    Health Maintenance Due  Topic Date Due   Zoster Vaccines- Shingrix (1 of 2) Never done   Pneumococcal Vaccine 75-71 Years old (2 - PPSV23 or PCV20) 02/08/2019   COVID-19 Vaccine (3 - Moderna risk series) 07/10/2020    There are no preventive care reminders to display for this patient.   Lab Results  Component Value Date   TSH 1.200 06/23/2013   Lab Results  Component Value Date   WBC 11.0 (H) 05/09/2020   HGB 15.7 05/09/2020   HCT 43.8 05/09/2020   MCV 97 05/09/2020   PLT 223 05/09/2020   Lab Results  Component Value Date   NA 140 05/09/2020  K 4.7 05/09/2020   CO2 32 (H) 05/09/2020   GLUCOSE 85 05/09/2020   BUN 12 05/09/2020   CREATININE 0.88 05/09/2020   BILITOT 0.5 05/09/2020   ALKPHOS 80 05/09/2020   AST 19 05/09/2020   ALT 17 05/09/2020   PROT 7.2 05/09/2020   ALBUMIN 4.2 05/09/2020   CALCIUM 9.4 05/09/2020   ANIONGAP 9 06/30/2018   Lab Results  Component Value Date   CHOL 101 12/20/2019   Lab Results  Component Value Date   HDL 31 (L) 12/20/2019   Lab Results  Component Value Date    LDLCALC 50 12/20/2019   Lab Results  Component Value Date   TRIG 104 12/20/2019   Lab Results  Component Value Date   CHOLHDL 3.3 12/20/2019   Lab Results  Component Value Date   HGBA1C 5.0 07/11/2017       Assessment & Plan:   Problem List Items Addressed This Visit       Other   Back pain without sciatica - Primary    Back pain started when a cabinet fell over patient about 1 week ago, started patient on Voltaren 75 mg tablet by mouth and flexeril 5 mg tablet by mouth 3 times a day as needed.  Patient knows to follow up with worsening or unresolved symptoms  RX sent to pharmacy       Relevant Medications   diclofenac (VOLTAREN) 75 MG EC tablet   cyclobenzaprine (FLEXERIL) 5 MG tablet     Meds ordered this encounter  Medications   diclofenac (VOLTAREN) 75 MG EC tablet    Sig: Take 1 tablet (75 mg total) by mouth 2 (two) times daily.    Dispense:  30 tablet    Refill:  0    Order Specific Question:   Supervising Provider    Answer:   Janora Norlander [[id number]]   cyclobenzaprine (FLEXERIL) 5 MG tablet    Sig: Take 1 tablet (5 mg total) by mouth 3 (three) times daily as needed for muscle spasms.    Dispense:  30 tablet    Refill:  0    Order Specific Question:   Supervising Provider    Answer:   Janora Norlander [[id number]]     Ivy Lynn, NP

## 2020-11-07 ENCOUNTER — Ambulatory Visit (INDEPENDENT_AMBULATORY_CARE_PROVIDER_SITE_OTHER): Payer: Medicare Other | Admitting: Licensed Clinical Social Worker

## 2020-11-07 DIAGNOSIS — F32A Depression, unspecified: Secondary | ICD-10-CM | POA: Diagnosis not present

## 2020-11-07 DIAGNOSIS — G809 Cerebral palsy, unspecified: Secondary | ICD-10-CM

## 2020-11-07 DIAGNOSIS — I252 Old myocardial infarction: Secondary | ICD-10-CM | POA: Diagnosis not present

## 2020-11-07 DIAGNOSIS — I25118 Atherosclerotic heart disease of native coronary artery with other forms of angina pectoris: Secondary | ICD-10-CM

## 2020-11-07 DIAGNOSIS — I10 Essential (primary) hypertension: Secondary | ICD-10-CM | POA: Diagnosis not present

## 2020-11-07 DIAGNOSIS — R482 Apraxia: Secondary | ICD-10-CM

## 2020-11-07 DIAGNOSIS — J441 Chronic obstructive pulmonary disease with (acute) exacerbation: Secondary | ICD-10-CM

## 2020-11-07 DIAGNOSIS — E785 Hyperlipidemia, unspecified: Secondary | ICD-10-CM | POA: Diagnosis not present

## 2020-11-07 DIAGNOSIS — E538 Deficiency of other specified B group vitamins: Secondary | ICD-10-CM

## 2020-11-07 DIAGNOSIS — F411 Generalized anxiety disorder: Secondary | ICD-10-CM

## 2020-11-07 DIAGNOSIS — Z550 Illiteracy and low-level literacy: Secondary | ICD-10-CM

## 2020-11-07 DIAGNOSIS — M5136 Other intervertebral disc degeneration, lumbar region: Secondary | ICD-10-CM

## 2020-11-07 NOTE — Patient Instructions (Signed)
Visit Information  PATIENT GOALS:  Goals Addressed             This Visit's Progress    Manage My Emotions; Manage anxiety issues; Manage depression issues       Timeframe:  Short-Term Goal Priority:  Medium Progress: On Track Start Date:             11/07/20                Expected End Date:           02/04/21            Follow Up Date 12/17/20   Manage emotions. Manage anxiety issues; Manage depression issues    Why is this important?   When you are stressed, down or upset, your body reacts too.  For example, your blood pressure may get higher; you may have a headache or stomachache.  When your emotions get the best of you, your body's ability to fight off cold and flu gets weak.  These steps will help you manage your emotions.     Patient Self Care Activities:  Self administers medications as prescribed Attends all scheduled provider appointments  Patient Coping Strengths:  Family Friends  Patient Self Care Deficits:  Mobility issues Depression issues Anxiety issues  Patient Goals:  - spend time or talk with others at least 2 to 3 times per week - practice relaxation or meditation daily - keep a calendar with appointment dates  Follow Up Plan: [name] to call client on 12/17/20     Rylee Toole.Aveion Nguyen MSW, [name] Licensed Clinical Social Worker [facility] Care Management [phone]

## 2020-11-07 NOTE — Chronic Care Management (AMB) (Signed)
Chronic Care Management    Clinical Social Work Note  11/07/2020 Name: [name] MRN: [id number] DOB: 1956-01-13  [name] is a 65 y.o. year old male who is a primary care patient of [name], James Gash, MD. The CCM team was consulted to assist the patient with chronic disease management and/or care coordination needs related to: [name] .   Engaged with patient by telephone for follow up visit in response to provider referral for social work chronic care management and care coordination services.   Consent to Services:  The patient was given information about Chronic Care Management services, agreed to services, and gave verbal consent prior to initiation of services.  Please see initial visit note for detailed documentation.   Patient agreed to services and consent obtained.   Assessment: Review of patient past medical history, allergies, medications, and health status, including review of relevant consultants reports was performed today as part of a comprehensive evaluation and provision of chronic care management and care coordination services.     SDOH (Social Determinants of Health) assessments and interventions performed:  SDOH Interventions    Flowsheet Row Most Recent Value  SDOH Interventions   Depression Interventions/Treatment  Currently on Treatment        Advanced Directives Status: See Vynca application for related entries.  CCM Care Plan  Allergies  Allergen Reactions   Chantix [Varenicline Tartrate] Nausea And Vomiting    Outpatient Encounter Medications as of 11/07/2020  Medication Sig   acetaminophen (TYLENOL) 325 MG tablet Take 2 tablets (650 mg total) by mouth every 6 (six) hours as needed for mild pain or headache.   albuterol (PROAIR HFA) 108 (90 Base) MCG/ACT inhaler 1-2 puffs every 6 hours as needed wheezing or shortness of breath.   aspirin EC 81 MG tablet Take 1 tablet (81 mg total) by mouth daily.   atorvastatin (LIPITOR) 40 MG  tablet TAKE 1 TABLET ONCE DAILY AT 6PM   buPROPion (WELLBUTRIN SR) 150 MG 12 hr tablet Take 1 tablet (150 mg total) by mouth daily with breakfast.   celecoxib (CELEBREX) 200 MG capsule Take 1 capsule (200 mg total) by mouth daily as needed.   cetaphil (CETAPHIL) lotion Apply 1 application topically 2 (two) times daily. For rash. Apply after washing with [facility] shower gel and rinsing with clear warm water.   cyclobenzaprine (FLEXERIL) 5 MG tablet Take 1 tablet (5 mg total) by mouth 3 (three) times daily as needed for muscle spasms.   diclofenac (VOLTAREN) 75 MG EC tablet Take 1 tablet (75 mg total) by mouth 2 (two) times daily.   DULoxetine (CYMBALTA) 60 MG capsule Take 1 capsule (60 mg total) by mouth daily. WITH A FULL STOMACH AT SUPPER TIME   fexofenadine (ALLEGRA) 180 MG tablet Take 1 tablet (180 mg total) by mouth daily. For allergy symptoms   fluocinonide-emollient (LIDEX-E) 0.05 % cream Apply 1 application topically 2 (two) times daily. To affected areas   fluticasone (FLONASE) 50 MCG/ACT nasal spray Place 2 sprays into both nostrils daily as needed for allergies or rhinitis.   fluticasone furoate-vilanterol (BREO ELLIPTA) 100-25 MCG/INH AEPB Inhale 1 puff into the lungs daily.   Ipratropium-Albuterol (COMBIVENT RESPIMAT) 20-100 MCG/ACT AERS respimat Inhale 1 puff into the lungs every 6 (six) hours   isosorbide mononitrate (IMDUR) 60 MG 24 hr tablet Take 1 tablet (60 mg total) by mouth daily.   metoprolol tartrate (LOPRESSOR) 25 MG tablet Take 1 tablet (25 mg total) by mouth 2 (two) times daily.  mirtazapine (REMERON) 15 MG tablet Take 1 tablet (15 mg total) by mouth at bedtime. For sleep   nitroGLYCERIN (NITROSTAT) 0.4 MG SL tablet Place 1 tablet (0.4 mg total) under the tongue every 5 (five) minutes x 3 doses as needed for chest pain.   pantoprazole (PROTONIX) 40 MG tablet TAKE  (1)  TABLET TWICE A DAY.   tamsulosin (FLOMAX) 0.4 MG CAPS capsule Take 2 capsules (0.8 mg total) by mouth  daily after supper.   Facility-Administered Encounter Medications as of 11/07/2020  Medication   cyanocobalamin ((VITAMIN B-12)) injection 1,000 mcg    Patient Active Problem List   Diagnosis Date Noted   Back pain without sciatica 10/31/2020   Degenerative disc disease, lumbar 01/16/2019   Depression    Hypokalemia 06/27/2018   Seizure disorder ([place]) 02/07/2018   Apraxia of speech 10/13/2017   Left-sided weakness 10/13/2017   Coronary artery disease of native artery of native heart with stable angina pectoris ([place])    Illiteracy 10/23/2016   Carpal tunnel syndrome of right wrist 06/15/2016   GAD (generalized anxiety disorder) 05/01/2016   History of MI (myocardial infarction) 02/26/2015   Dyslipidemia, goal LDL below 70 10/08/2009   Infantile cerebral palsy ([place]) 10/08/2009   Essential hypertension 10/08/2009    Conditions to be addressed/monitored: monitor client management of anxiety issues;monitor client management of depression issues   Care Plan : [name]  Updates made by [name], [name] since 11/07/2020 12:00 AM     Problem: Emotional Distress      Goal: Emotional Health Supported; Manage anxiety issues; manage depression issues   Start Date: 11/07/2020  Expected End Date: 02/04/2021  This Visit's Progress: On track  Priority: Medium  Note:   Current Barriers:  Chronic Mental Health needs related to anxiety management and depression management Mobility issues Suicidal Ideation/Homicidal Ideation: No  Clinical Social Work Goal(s):  patient will work with SW monthly by telephone or in person to reduce or manage symptoms related to anxiety management and related to depression management Patient will call RNCM as needed in next 30 days to discuss nursing needs of client Client will attend all scheduled medical appointments in next 30 days  Interventions: 1:1 collaboration with [name], MD regarding development and update of comprehensive plan  of care as evidenced by provider attestation and co-signature Talked with client about his transport arrangements with [name] for his appointment on 11/11/20 at [facility] Talked with client about in home help received weekly as scheduled with home health aide Talked with client about food supply of client Talked with client about mobility of client Talked with client about pain issues of client (he spoke of pain issues when a shelf fell on him recently. He spoke of shoulder pain, back pain) Talked with client about RNCM support Talked with client about sleeping issues of client  Patient Self Care Activities:  Self administers medications as prescribed Attends all scheduled provider appointments  Patient Coping Strengths:  Family Friends  Patient Self Care Deficits:  Mobility issues Depression issues Anxiety issues  Patient Goals:  - spend time or talk with others at least 2 to 3 times per week - practice relaxation or meditation daily - keep a calendar with appointment dates  Follow Up Plan: [name] to call client on 12/17/20    Veer Elamin.Jai Bear MSW, [name] Licensed Clinical Social Worker [facility] Care Management [phone]

## 2020-11-11 ENCOUNTER — Other Ambulatory Visit: Payer: Self-pay

## 2020-11-11 ENCOUNTER — Ambulatory Visit (INDEPENDENT_AMBULATORY_CARE_PROVIDER_SITE_OTHER): Payer: Medicare Other | Admitting: *Deleted

## 2020-11-11 DIAGNOSIS — E538 Deficiency of other specified B group vitamins: Secondary | ICD-10-CM

## 2020-11-11 NOTE — Progress Notes (Signed)
B12 1,000 mcg given in right deltoid. No difficulties

## 2020-11-13 ENCOUNTER — Telehealth: Payer: Medicare Other

## 2020-12-04 ENCOUNTER — Ambulatory Visit (INDEPENDENT_AMBULATORY_CARE_PROVIDER_SITE_OTHER): Payer: Medicare Other | Admitting: Licensed Clinical Social Worker

## 2020-12-04 DIAGNOSIS — F32A Depression, unspecified: Secondary | ICD-10-CM

## 2020-12-04 DIAGNOSIS — J441 Chronic obstructive pulmonary disease with (acute) exacerbation: Secondary | ICD-10-CM

## 2020-12-04 DIAGNOSIS — F411 Generalized anxiety disorder: Secondary | ICD-10-CM

## 2020-12-04 DIAGNOSIS — G809 Cerebral palsy, unspecified: Secondary | ICD-10-CM

## 2020-12-04 DIAGNOSIS — E538 Deficiency of other specified B group vitamins: Secondary | ICD-10-CM

## 2020-12-04 DIAGNOSIS — R482 Apraxia: Secondary | ICD-10-CM

## 2020-12-04 DIAGNOSIS — I25118 Atherosclerotic heart disease of native coronary artery with other forms of angina pectoris: Secondary | ICD-10-CM

## 2020-12-04 DIAGNOSIS — M5136 Other intervertebral disc degeneration, lumbar region: Secondary | ICD-10-CM

## 2020-12-04 DIAGNOSIS — I10 Essential (primary) hypertension: Secondary | ICD-10-CM | POA: Diagnosis not present

## 2020-12-04 DIAGNOSIS — E785 Hyperlipidemia, unspecified: Secondary | ICD-10-CM | POA: Diagnosis not present

## 2020-12-04 DIAGNOSIS — Z550 Illiteracy and low-level literacy: Secondary | ICD-10-CM

## 2020-12-04 NOTE — Chronic Care Management (AMB) (Signed)
Chronic Care Management    Clinical Social Work Note  12/04/2020 Name: [name] MRN: [id number] DOB: 09/30/1955  [name] is a 65 y.o. year old male who is a primary care patient of [name], [name], MD. The CCM team was consulted to assist the patient with chronic disease management and/or care coordination needs related to: [name] .   Engaged with patient by telephone for follow up visit in response to provider referral for social work chronic care management and care coordination services.   Consent to Services:  The patient was given information about Chronic Care Management services, agreed to services, and gave verbal consent prior to initiation of services.  Please see initial visit note for detailed documentation.   Patient agreed to services and consent obtained.   Assessment: Review of patient past medical history, allergies, medications, and health status, including review of relevant consultants reports was performed today as part of a comprehensive evaluation and provision of chronic care management and care coordination services.     SDOH (Social Determinants of Health) assessments and interventions performed: Client has some mobility issues; occasional pain in walking SDOH Interventions    Flowsheet Row Most Recent Value  SDOH Interventions   Depression Interventions/Treatment  Currently on Treatment        Advanced Directives Status: See Vynca application for related entries.  CCM Care Plan  Allergies  Allergen Reactions   Chantix [Varenicline Tartrate] Nausea And Vomiting    Outpatient Encounter Medications as of 12/04/2020  Medication Sig   acetaminophen (TYLENOL) 325 MG tablet Take 2 tablets (650 mg total) by mouth every 6 (six) hours as needed for mild pain or headache.   albuterol (PROAIR HFA) 108 (90 Base) MCG/ACT inhaler 1-2 puffs every 6 hours as needed wheezing or shortness of breath.   aspirin EC 81 MG tablet Take 1 tablet (81 mg  total) by mouth daily.   atorvastatin (LIPITOR) 40 MG tablet TAKE 1 TABLET ONCE DAILY AT 6PM   buPROPion (WELLBUTRIN SR) 150 MG 12 hr tablet Take 1 tablet (150 mg total) by mouth daily with breakfast.   celecoxib (CELEBREX) 200 MG capsule Take 1 capsule (200 mg total) by mouth daily as needed.   cetaphil (CETAPHIL) lotion Apply 1 application topically 2 (two) times daily. For rash. Apply after washing with [facility] shower gel and rinsing with clear warm water.   cyclobenzaprine (FLEXERIL) 5 MG tablet Take 1 tablet (5 mg total) by mouth 3 (three) times daily as needed for muscle spasms.   diclofenac (VOLTAREN) 75 MG EC tablet Take 1 tablet (75 mg total) by mouth 2 (two) times daily.   DULoxetine (CYMBALTA) 60 MG capsule Take 1 capsule (60 mg total) by mouth daily. WITH A FULL STOMACH AT SUPPER TIME   fexofenadine (ALLEGRA) 180 MG tablet Take 1 tablet (180 mg total) by mouth daily. For allergy symptoms   fluocinonide-emollient (LIDEX-E) 0.05 % cream Apply 1 application topically 2 (two) times daily. To affected areas   fluticasone (FLONASE) 50 MCG/ACT nasal spray Place 2 sprays into both nostrils daily as needed for allergies or rhinitis.   fluticasone furoate-vilanterol (BREO ELLIPTA) 100-25 MCG/INH AEPB Inhale 1 puff into the lungs daily.   Ipratropium-Albuterol (COMBIVENT RESPIMAT) 20-100 MCG/ACT AERS respimat Inhale 1 puff into the lungs every 6 (six) hours   isosorbide mononitrate (IMDUR) 60 MG 24 hr tablet Take 1 tablet (60 mg total) by mouth daily.   metoprolol tartrate (LOPRESSOR) 25 MG tablet Take 1 tablet (25  mg total) by mouth 2 (two) times daily.   mirtazapine (REMERON) 15 MG tablet Take 1 tablet (15 mg total) by mouth at bedtime. For sleep   nitroGLYCERIN (NITROSTAT) 0.4 MG SL tablet Place 1 tablet (0.4 mg total) under the tongue every 5 (five) minutes x 3 doses as needed for chest pain.   pantoprazole (PROTONIX) 40 MG tablet TAKE  (1)  TABLET TWICE A DAY.   tamsulosin (FLOMAX) 0.4  MG CAPS capsule Take 2 capsules (0.8 mg total) by mouth daily after supper.   Facility-Administered Encounter Medications as of 12/04/2020  Medication   cyanocobalamin ((VITAMIN B-12)) injection 1,000 mcg    Patient Active Problem List   Diagnosis Date Noted   Back pain without sciatica 10/31/2020   Degenerative disc disease, lumbar 01/16/2019   Depression    Hypokalemia 06/27/2018   Seizure disorder ([place]) 02/07/2018   Apraxia of speech 10/13/2017   Left-sided weakness 10/13/2017   Coronary artery disease of native artery of native heart with stable angina pectoris ([place])    Illiteracy 10/23/2016   Carpal tunnel syndrome of right wrist 06/15/2016   GAD (generalized anxiety disorder) 05/01/2016   History of MI (myocardial infarction) 02/26/2015   Dyslipidemia, goal LDL below 70 10/08/2009   Infantile cerebral palsy ([place]) 10/08/2009   Essential hypertension 10/08/2009    Conditions to be addressed/monitored: Monitor client completion of daily ADLs. Monitor client management of depression issues   Care Plan : [name]  Updates made by [name], [name] since 12/04/2020 12:00 AM     Problem: Coping Skills (General Plan of Care)      Goal: Coping Skills Enhanced; Client to complete ADLs daily, with assistance as needed   Start Date: 12/04/2020  Expected End Date: 03/05/2021  This Visit's Progress: On track  Priority: Medium  Note:   Current barriers:   Patient in need of assistance with connecting to community resources for ADLs completion support and for food assistance support Patient is unable to independently navigate community resource options without care coordination support Some mobility issues Depression issues Decreased family support     Clinical Goals:  Client to communicate with [name] in next 30 days to discuss ADLs completion of  client and to discuss depression management of client Client to call RNCM as needed in next 30 days for CCM nursing  support Client to attend scheduled medical appointments in next 30 days  Clinical Interventions:  Collaboration with [name], MD regarding development and update of comprehensive plan of care as evidenced by provider attestation and co-signature Talked with client about client needs Talked with client about his next appointment at [facility] on 12/13/20. Informed [place] that [name] had scheduled transport assist for client to and from 12/13/20 appointment for client at [facility] Talked with client about pain issues of client Talked with client about sleeping issues of client Talked with client about repairs at his home and getting ready for winter weather Talked with client about family support Talked with client about ambulation of client (he said he has been active mowing his yard and is doing well with ambulation) Talked with client about food provision of client Talked with client about in home support with home health aide Provided counseling support   Patient Coping Skills: Attends scheduled medical appointments Talks with RNCM as needed regarding nursing issues of client Takes medications as prescribed  Patient Deficits: Decreased family support Some pain issues Depression issues Transportation needs  Patient Goals: In next 30 days, patient  will: Attend scheduled medical appointments Call RNCM as needed for nursing support Allow time for self care with rest and relaxation   Follow Up Plan: [name] to call client on 01/13/21 to assess client needs    Jayvin Sedlar.Ryzen Deady MSW, [name] Licensed Clinical Social Worker [facility] Care Management [phone]

## 2020-12-04 NOTE — Patient Instructions (Signed)
Visit Information  PATIENT GOALS:  Goals Addressed             This Visit's Progress    Manage My Emotions; Manage depression issues. Complete ADLs daily with assistance as needed       Timeframe:  Short-Term Goal Priority:  Medium Progress: On Track Start Date:             12/04/20                Expected End Date:           03/05/21            Follow Up Date 01/13/21   Manage emotions. Manage depression issues . Complete ADLs daily with assistance as needed   Why is this important?   When you are stressed, down or upset, your body reacts too.  For example, your blood pressure may get higher; you may have a headache or stomachache.  When your emotions get the best of you, your body's ability to fight off cold and flu gets weak.  These steps will help you manage your emotions.     Patient Self Care Activities:  Self administers medications as prescribed Attends all scheduled provider appointments  Patient Coping Strengths:  Family Friends  Patient Self Care Deficits:  Mobility issues Depression issues Anxiety issues  Patient Goals:  - spend time or talk with others at least 2 to 3 times per week - practice relaxation or meditation daily - keep a calendar with appointment dates  Follow Up Plan: [name] to call client on 01/13/21 to assess client needs     Wrenley Weatherman.Jessy Calixte MSW, [name] Licensed Clinical Social Worker [facility] Care Management [phone]

## 2020-12-11 ENCOUNTER — Telehealth: Payer: Medicare Other | Admitting: *Deleted

## 2020-12-13 ENCOUNTER — Other Ambulatory Visit: Payer: Self-pay

## 2020-12-13 ENCOUNTER — Ambulatory Visit (INDEPENDENT_AMBULATORY_CARE_PROVIDER_SITE_OTHER): Payer: Medicare Other | Admitting: *Deleted

## 2020-12-13 DIAGNOSIS — E538 Deficiency of other specified B group vitamins: Secondary | ICD-10-CM | POA: Diagnosis not present

## 2020-12-14 ENCOUNTER — Other Ambulatory Visit: Payer: Self-pay | Admitting: Family Medicine

## 2020-12-14 DIAGNOSIS — I209 Angina pectoris, unspecified: Secondary | ICD-10-CM

## 2020-12-14 DIAGNOSIS — I10 Essential (primary) hypertension: Secondary | ICD-10-CM

## 2020-12-14 DIAGNOSIS — I25118 Atherosclerotic heart disease of native coronary artery with other forms of angina pectoris: Secondary | ICD-10-CM

## 2020-12-17 ENCOUNTER — Telehealth: Payer: Medicare Other

## 2020-12-20 ENCOUNTER — Telehealth: Payer: Self-pay | Admitting: Family Medicine

## 2020-12-20 ENCOUNTER — Ambulatory Visit (INDEPENDENT_AMBULATORY_CARE_PROVIDER_SITE_OTHER): Payer: Medicare Other | Admitting: Licensed Clinical Social Worker

## 2020-12-20 DIAGNOSIS — F411 Generalized anxiety disorder: Secondary | ICD-10-CM

## 2020-12-20 DIAGNOSIS — M5136 Other intervertebral disc degeneration, lumbar region: Secondary | ICD-10-CM

## 2020-12-20 DIAGNOSIS — E538 Deficiency of other specified B group vitamins: Secondary | ICD-10-CM

## 2020-12-20 DIAGNOSIS — J441 Chronic obstructive pulmonary disease with (acute) exacerbation: Secondary | ICD-10-CM

## 2020-12-20 DIAGNOSIS — I25118 Atherosclerotic heart disease of native coronary artery with other forms of angina pectoris: Secondary | ICD-10-CM

## 2020-12-20 DIAGNOSIS — F3341 Major depressive disorder, recurrent, in partial remission: Secondary | ICD-10-CM

## 2020-12-20 DIAGNOSIS — Z550 Illiteracy and low-level literacy: Secondary | ICD-10-CM

## 2020-12-20 DIAGNOSIS — R482 Apraxia: Secondary | ICD-10-CM

## 2020-12-20 DIAGNOSIS — F32A Depression, unspecified: Secondary | ICD-10-CM

## 2020-12-20 DIAGNOSIS — I252 Old myocardial infarction: Secondary | ICD-10-CM

## 2020-12-20 DIAGNOSIS — E785 Hyperlipidemia, unspecified: Secondary | ICD-10-CM

## 2020-12-20 DIAGNOSIS — I10 Essential (primary) hypertension: Secondary | ICD-10-CM

## 2020-12-20 DIAGNOSIS — G809 Cerebral palsy, unspecified: Secondary | ICD-10-CM

## 2020-12-20 NOTE — Patient Instructions (Signed)
Visit Information  PATIENT GOALS:  Goals Addressed             This Visit's Progress    Manage My Emotions; Manage depression issues. Complete ADLs daily with assistance as needed       Timeframe:  Short-Term Goal Priority:  Medium Progress: On Track Start Date:            12/20/20                Expected End Date:          03/20/21            Follow Up Date 12/24/20   Manage emotions. Manage depression issues . Complete ADLs daily with assistance as needed   Why is this important?   When you are stressed, down or upset, your body reacts too.  For example, your blood pressure may get higher; you may have a headache or stomachache.  When your emotions get the best of you, your body's ability to fight off cold and flu gets weak.  These steps will help you manage your emotions.     Patient Self Care Activities:  Self administers medications as prescribed Attends all scheduled provider appointments  Patient Coping Strengths:  Family Friends  Patient Self Care Deficits:  Mobility issues Depression issues Anxiety issues  Patient Goals:  - spend time or talk with others at least 2 to 3 times per week - practice relaxation or meditation daily - keep a calendar with appointment dates  Follow Up Plan: [name] to call client on 12/24/20 to assess client needs     Miko Lovos.Jeri Rawlins MSW, [name] Licensed Clinical Social Worker [facility] Care Management [phone]

## 2020-12-20 NOTE — Chronic Care Management (AMB) (Signed)
Chronic Care Management    Clinical Social Work Note  12/20/2020 Name: [name] MRN: [id number] DOB: December 16, 1955  [name] is a 65 y.o. year old male who is a primary care patient of [name], [name], MD. The CCM team was consulted to assist the patient with chronic disease management and/or care coordination needs related to: [name] .   Engaged with patient by telephone for follow up visit in response to provider referral for social work chronic care management and care coordination services.   Consent to Services:  The patient was given information about Chronic Care Management services, agreed to services, and gave verbal consent prior to initiation of services.  Please see initial visit note for detailed documentation.   Patient agreed to services and consent obtained.   Assessment: Review of patient past medical history, allergies, medications, and health status, including review of relevant consultants reports was performed today as part of a comprehensive evaluation and provision of chronic care management and care coordination services.     SDOH (Social Determinants of Health) assessments and interventions performed: Client has ambulation challenges; he gets fatigued easily. He has a cane to use as needed SDOH Interventions    Flowsheet Row Most Recent Value  SDOH Interventions   Depression Interventions/Treatment  Currently on Treatment        Advanced Directives Status: See Vynca application for related entries.  CCM Care Plan  Allergies  Allergen Reactions   Chantix [Varenicline Tartrate] Nausea And Vomiting    Outpatient Encounter Medications as of 12/20/2020  Medication Sig   acetaminophen (TYLENOL) 325 MG tablet Take 2 tablets (650 mg total) by mouth every 6 (six) hours as needed for mild pain or headache.   albuterol (PROAIR HFA) 108 (90 Base) MCG/ACT inhaler 1-2 puffs every 6 hours as needed wheezing or shortness of breath.   aspirin EC 81 MG  tablet Take 1 tablet (81 mg total) by mouth daily.   atorvastatin (LIPITOR) 40 MG tablet TAKE 1 TABLET ONCE DAILY AT 6PM   buPROPion (WELLBUTRIN SR) 150 MG 12 hr tablet Take 1 tablet (150 mg total) by mouth daily with breakfast.   celecoxib (CELEBREX) 200 MG capsule Take 1 capsule (200 mg total) by mouth daily as needed.   cetaphil (CETAPHIL) lotion Apply 1 application topically 2 (two) times daily. For rash. Apply after washing with [facility] shower gel and rinsing with clear warm water.   cyclobenzaprine (FLEXERIL) 5 MG tablet Take 1 tablet (5 mg total) by mouth 3 (three) times daily as needed for muscle spasms.   diclofenac (VOLTAREN) 75 MG EC tablet Take 1 tablet (75 mg total) by mouth 2 (two) times daily.   DULoxetine (CYMBALTA) 60 MG capsule Take 1 capsule (60 mg total) by mouth daily. WITH A FULL STOMACH AT SUPPER TIME   fexofenadine (ALLEGRA) 180 MG tablet Take 1 tablet (180 mg total) by mouth daily. For allergy symptoms   fluocinonide-emollient (LIDEX-E) 0.05 % cream Apply 1 application topically 2 (two) times daily. To affected areas   fluticasone (FLONASE) 50 MCG/ACT nasal spray Place 2 sprays into both nostrils daily as needed for allergies or rhinitis.   fluticasone furoate-vilanterol (BREO ELLIPTA) 100-25 MCG/INH AEPB Inhale 1 puff into the lungs daily.   Ipratropium-Albuterol (COMBIVENT RESPIMAT) 20-100 MCG/ACT AERS respimat Inhale 1 puff into the lungs every 6 (six) hours   isosorbide mononitrate (IMDUR) 60 MG 24 hr tablet Take 1 tablet (60 mg total) by mouth daily.   metoprolol tartrate (LOPRESSOR)  25 MG tablet Take 1 tablet (25 mg total) by mouth 2 (two) times daily. (NEEDS TO BE SEEN BEFORE NEXT REFILL)   mirtazapine (REMERON) 15 MG tablet Take 1 tablet (15 mg total) by mouth at bedtime. For sleep   nitroGLYCERIN (NITROSTAT) 0.4 MG SL tablet Place 1 tablet (0.4 mg total) under the tongue every 5 (five) minutes x 3 doses as needed for chest pain.   pantoprazole (PROTONIX) 40 MG  tablet TAKE  (1)  TABLET TWICE A DAY.   tamsulosin (FLOMAX) 0.4 MG CAPS capsule Take 2 capsules (0.8 mg total) by mouth daily after supper.   Facility-Administered Encounter Medications as of 12/20/2020  Medication   cyanocobalamin ((VITAMIN B-12)) injection 1,000 mcg    Patient Active Problem List   Diagnosis Date Noted   Back pain without sciatica 10/31/2020   Degenerative disc disease, lumbar 01/16/2019   Depression    Hypokalemia 06/27/2018   Seizure disorder ([place]) 02/07/2018   Apraxia of speech 10/13/2017   Left-sided weakness 10/13/2017   Coronary artery disease of native artery of native heart with stable angina pectoris ([place])    Illiteracy 10/23/2016   Carpal tunnel syndrome of right wrist 06/15/2016   GAD (generalized anxiety disorder) 05/01/2016   History of MI (myocardial infarction) 02/26/2015   Dyslipidemia, goal LDL below 70 10/08/2009   Infantile cerebral palsy ([place]) 10/08/2009   Essential hypertension 10/08/2009    Conditions to be addressed/monitored: Monitor client completion of ADLs; monitor client management of anxiety issues   Care Plan : [name]  Updates made by [name], [name] since 12/20/2020 12:00 AM     Problem: Coping Skills (General Plan of Care)      Goal: Coping Skills Enhanced; Complete ADLs daily as able; manage anxiety issues   Start Date: 12/20/2020  Expected End Date: 03/21/2021  This Visit's Progress: On track  Priority: Medium  Note:   Current barriers:   Patient in need of assistance with connecting to community resources for possible help with in home care needs and ADLs completion Patient is unable to independently navigate community resource options without care coordination support Mobility needs Memory issues  Clinical Goals:  Client to communicate with [name] in next 30 days to discuss client completion of daily ADLs and to discuss client in home care needs Client to call RNCM or [name] as needed in next 30 days for  CCM support for client  Clinical Interventions:  Collaboration with [name], MD regarding development and update of comprehensive plan of care as evidenced by provider attestation and co-signature Assessment of needs, barriers of client Talked with client about his scheduled appointment on 01/13/21 at [facility] for B12 injection Talked with client about in home aide assistance received by client Talked with client about food provision of client Talked with client about client heating and air conditioning. He said he received recently a new air conditioner. He said he had talked with several agencies that may be able to help him with fuel oil for the winter months  Coping Skills: Has scheduled in home aide services each week Has some family support Attends scheduled medical appointments  Patient Deficits: Some mobility issues Memory challenges Pain issues  Patient Goals:  Patient will attend scheduled medical appointments in next 30 days Patient will call RNCM in next 30 days as needed for CCM nursing support Patient will cooperate with home health aide weekly to complete needed ADLs and to meet in home care needs of client -  Follow Up Plan: [name] to call client on 12/24/20 at [place].[name] MSW, [name] Licensed Clinical Social Worker [facility] Care Management [phone]

## 2020-12-20 NOTE — Telephone Encounter (Signed)
NA/NVM pt needs an appt with Dr. Livia Snellen in order for an updated [place] form to be filled out

## 2020-12-24 ENCOUNTER — Ambulatory Visit: Payer: Medicare Other | Admitting: Licensed Clinical Social Worker

## 2020-12-24 DIAGNOSIS — I10 Essential (primary) hypertension: Secondary | ICD-10-CM

## 2020-12-24 DIAGNOSIS — F32A Depression, unspecified: Secondary | ICD-10-CM

## 2020-12-24 DIAGNOSIS — M51369 Other intervertebral disc degeneration, lumbar region without mention of lumbar back pain or lower extremity pain: Secondary | ICD-10-CM

## 2020-12-24 DIAGNOSIS — F3341 Major depressive disorder, recurrent, in partial remission: Secondary | ICD-10-CM

## 2020-12-24 DIAGNOSIS — F411 Generalized anxiety disorder: Secondary | ICD-10-CM

## 2020-12-24 DIAGNOSIS — K219 Gastro-esophageal reflux disease without esophagitis: Secondary | ICD-10-CM

## 2020-12-24 DIAGNOSIS — Z550 Illiteracy and low-level literacy: Secondary | ICD-10-CM

## 2020-12-24 DIAGNOSIS — I252 Old myocardial infarction: Secondary | ICD-10-CM

## 2020-12-24 DIAGNOSIS — R482 Apraxia: Secondary | ICD-10-CM

## 2020-12-24 DIAGNOSIS — E538 Deficiency of other specified B group vitamins: Secondary | ICD-10-CM

## 2020-12-24 DIAGNOSIS — E785 Hyperlipidemia, unspecified: Secondary | ICD-10-CM

## 2020-12-24 DIAGNOSIS — I25118 Atherosclerotic heart disease of native coronary artery with other forms of angina pectoris: Secondary | ICD-10-CM

## 2020-12-24 DIAGNOSIS — M5136 Other intervertebral disc degeneration, lumbar region: Secondary | ICD-10-CM

## 2020-12-24 DIAGNOSIS — J441 Chronic obstructive pulmonary disease with (acute) exacerbation: Secondary | ICD-10-CM

## 2020-12-24 DIAGNOSIS — G809 Cerebral palsy, unspecified: Secondary | ICD-10-CM

## 2020-12-24 NOTE — Chronic Care Management (AMB) (Signed)
Chronic Care Management    Clinical Social Work Note  12/24/2020 Name: [name] MRN: [id number] DOB: 02-Jun-1955  [name] is a 65 y.o. year old male who is a primary care patient of [name], [name], MD. The CCM team was consulted to assist the patient with chronic disease management and/or care coordination needs related to: [name] .   Engaged with patient by telephone for follow up visit in response to provider referral for social work chronic care management and care coordination services.   Consent to Services:  The patient was given information about Chronic Care Management services, agreed to services, and gave verbal consent prior to initiation of services.  Please see initial visit note for detailed documentation.   Patient agreed to services and consent obtained.   Assessment: Review of patient past medical history, allergies, medications, and health status, including review of relevant consultants reports was performed today as part of a comprehensive evaluation and provision of chronic care management and care coordination services.     SDOH (Social Determinants of Health) assessments and interventions performed:  SDOH Interventions    Flowsheet Row Most Recent Value  SDOH Interventions   Physical Activity Interventions Other (Comments)  [decreased ability to walk independently,  has a cane to use to help him walk]  Depression Interventions/Treatment  Currently on Treatment        Advanced Directives Status: See Vynca application for related entries.  CCM Care Plan  Allergies  Allergen Reactions   Chantix [Varenicline Tartrate] Nausea And Vomiting    Outpatient Encounter Medications as of 12/24/2020  Medication Sig   acetaminophen (TYLENOL) 325 MG tablet Take 2 tablets (650 mg total) by mouth every 6 (six) hours as needed for mild pain or headache.   albuterol (PROAIR HFA) 108 (90 Base) MCG/ACT inhaler 1-2 puffs every 6 hours as needed wheezing  or shortness of breath.   aspirin EC 81 MG tablet Take 1 tablet (81 mg total) by mouth daily.   atorvastatin (LIPITOR) 40 MG tablet TAKE 1 TABLET ONCE DAILY AT 6PM   buPROPion (WELLBUTRIN SR) 150 MG 12 hr tablet Take 1 tablet (150 mg total) by mouth daily with breakfast.   celecoxib (CELEBREX) 200 MG capsule Take 1 capsule (200 mg total) by mouth daily as needed.   cetaphil (CETAPHIL) lotion Apply 1 application topically 2 (two) times daily. For rash. Apply after washing with [facility] shower gel and rinsing with clear warm water.   cyclobenzaprine (FLEXERIL) 5 MG tablet Take 1 tablet (5 mg total) by mouth 3 (three) times daily as needed for muscle spasms.   diclofenac (VOLTAREN) 75 MG EC tablet Take 1 tablet (75 mg total) by mouth 2 (two) times daily.   DULoxetine (CYMBALTA) 60 MG capsule Take 1 capsule (60 mg total) by mouth daily. WITH A FULL STOMACH AT SUPPER TIME   fexofenadine (ALLEGRA) 180 MG tablet Take 1 tablet (180 mg total) by mouth daily. For allergy symptoms   fluocinonide-emollient (LIDEX-E) 0.05 % cream Apply 1 application topically 2 (two) times daily. To affected areas   fluticasone (FLONASE) 50 MCG/ACT nasal spray Place 2 sprays into both nostrils daily as needed for allergies or rhinitis.   fluticasone furoate-vilanterol (BREO ELLIPTA) 100-25 MCG/INH AEPB Inhale 1 puff into the lungs daily.   Ipratropium-Albuterol (COMBIVENT RESPIMAT) 20-100 MCG/ACT AERS respimat Inhale 1 puff into the lungs every 6 (six) hours   isosorbide mononitrate (IMDUR) 60 MG 24 hr tablet Take 1 tablet (60 mg total) by  mouth daily.   metoprolol tartrate (LOPRESSOR) 25 MG tablet Take 1 tablet (25 mg total) by mouth 2 (two) times daily. (NEEDS TO BE SEEN BEFORE NEXT REFILL)   mirtazapine (REMERON) 15 MG tablet Take 1 tablet (15 mg total) by mouth at bedtime. For sleep   nitroGLYCERIN (NITROSTAT) 0.4 MG SL tablet Place 1 tablet (0.4 mg total) under the tongue every 5 (five) minutes x 3 doses as needed for  chest pain.   pantoprazole (PROTONIX) 40 MG tablet TAKE  (1)  TABLET TWICE A DAY.   tamsulosin (FLOMAX) 0.4 MG CAPS capsule Take 2 capsules (0.8 mg total) by mouth daily after supper.   Facility-Administered Encounter Medications as of 12/24/2020  Medication   cyanocobalamin ((VITAMIN B-12)) injection 1,000 mcg    Patient Active Problem List   Diagnosis Date Noted   Back pain without sciatica 10/31/2020   Degenerative disc disease, lumbar 01/16/2019   Depression    Hypokalemia 06/27/2018   Seizure disorder ([place]) 02/07/2018   Apraxia of speech 10/13/2017   Left-sided weakness 10/13/2017   Coronary artery disease of native artery of native heart with stable angina pectoris ([place])    Illiteracy 10/23/2016   Carpal tunnel syndrome of right wrist 06/15/2016   GAD (generalized anxiety disorder) 05/01/2016   History of MI (myocardial infarction) 02/26/2015   Dyslipidemia, goal LDL below 70 10/08/2009   Infantile cerebral palsy ([place]) 10/08/2009   Essential hypertension 10/08/2009    Conditions to be addressed/monitored: monitor client completion of ADLs  Care Plan : [name]  Updates made by [name], [name] since 12/24/2020 12:00 AM     Problem: Functional Decline      Goal: Mobility and Function Maintained; Complete ADLs daily as he is able   Start Date: 12/24/2020  Expected End Date: 03/24/2021  This Visit's Progress: On track  Priority: Medium  Note:   Current barriers:   Patient in need of assistance with connecting to community resources for possible help with completion of ADLs Patient is unable to independently navigate community resource options without care coordination support Mobility issues Some pain issues Literacy issues  Clinical Goals:  [name] to talk with client in next 30 days about ADLs completion of client [name] to talk with client in next 30 days about CNA in home support for client [name] to talk with client in next 30 days about pain issues of  client  Clinical Interventions:  Collaboration with [name], MD regarding development and update of comprehensive plan of care as evidenced by provider attestation and co-signature Assessment of needs, barriers of client Talked with [place] about in home care with CNA With verbal permission from client today, [name] talked with Theadora Rama, CNA for client. Theadora Rama works with [facility] based in [place], [place]. She works with [name] to provide in home care 17 hours to 18 hours per week.  She said she helped client with cooking, cleaning, bathing, grocery shopping and paying his bills. [name] asked Theadora Rama about ADLs support. She said client is having more problems walking. He cannot push lawnmower outdoors as he used to. If he works in yard he has to use [employer]. She said his legs get weak.   [name] talked with Theadora Rama about family support for client. She said client has reduced family support. She said she is helping client with all aspects of his care.   Theadora Rama and [place] asked if client might be able to get more in home care hours with CNA  [name] informed Theadora Rama and [place] of client appointment for B12 shot at [facility] on 01/13/21 at 11:00 AM. Client and Theadora Rama informed that [name] transport will pick client up on 01/13/21 at 10:30 AM to transport him to appointment. [name] will also bring client home after 01/13/21 appointment [name] informed [place] and Theadora Rama that [name] would call client for follow up call on 01/13/21 at 2:15 PM [name] collaborated today with [facility] regarding client needs  Patient Coping Skills: Has support from CNA as scheduled weekly Attends scheduled medical appointments  Patient Deficits: Managing multiple medical issues Some pain issues Some walking issues  Patient Goals: In next 30 days, patient will: Attend scheduled medical appointments Talk with [facility] about nursing needs of client Communicate with [name] as needed to discuss client needs -  Follow Up Plan: [name] to call  client on 01/13/21 to discuss client needs at that time     Donold Sheffler.Drew Herman MSW, [name] Licensed Clinical Social Worker [facility] Care Management [phone]

## 2020-12-24 NOTE — Patient Instructions (Signed)
Visit Information  PATIENT GOALS:  Goals Addressed             This Visit's Progress    Manage My Emotions; Manage depression issues. Complete ADLs daily with assistance as needed       Timeframe:  Short-Term Goal Priority:  Medium Progress: On Track Start Date:            12/24/20                Expected End Date:          03/24/21            Follow Up Date 01/13/21   Manage emotions. Manage depression issues . Complete ADLs daily with assistance as needed   Why is this important?   When you are stressed, down or upset, your body reacts too.  For example, your blood pressure may get higher; you may have a headache or stomachache.  When your emotions get the best of you, your body's ability to fight off cold and flu gets weak.  These steps will help you manage your emotions.     Patient Self Care Activities:  Self administers medications as prescribed Attends all scheduled provider appointments  Patient Coping Strengths:  Family Friends  Patient Self Care Deficits:  Mobility issues Depression issues Anxiety issues  Patient Goals:  - spend time or talk with others at least 2 to 3 times per week - practice relaxation or meditation daily - keep a calendar with appointment dates  Follow Up Plan: [name] to call client on 01/13/21 to assess client needs     [name].[name] MSW, [name] Licensed Clinical Social Worker [facility] Care Management [phone]

## 2020-12-25 ENCOUNTER — Other Ambulatory Visit: Payer: Self-pay | Admitting: Family Medicine

## 2020-12-25 DIAGNOSIS — K219 Gastro-esophageal reflux disease without esophagitis: Secondary | ICD-10-CM

## 2020-12-25 NOTE — Telephone Encounter (Signed)
Talked w/ [name], [name] who talked w/ Pt & his aide who works through [employer] this morning trying to get pt & aide more hours. We set pt up an appt with Dr. Livia Snellen on 01/28/21 and Nicki Reaper will let them know of appt and arrange his transportation for this appt.

## 2020-12-31 ENCOUNTER — Telehealth: Payer: Self-pay

## 2020-12-31 ENCOUNTER — Ambulatory Visit: Payer: Medicare Other | Admitting: Licensed Clinical Social Worker

## 2020-12-31 ENCOUNTER — Telehealth: Payer: Self-pay | Admitting: Family Medicine

## 2020-12-31 DIAGNOSIS — Z550 Illiteracy and low-level literacy: Secondary | ICD-10-CM

## 2020-12-31 DIAGNOSIS — I209 Angina pectoris, unspecified: Secondary | ICD-10-CM

## 2020-12-31 DIAGNOSIS — F411 Generalized anxiety disorder: Secondary | ICD-10-CM

## 2020-12-31 DIAGNOSIS — J441 Chronic obstructive pulmonary disease with (acute) exacerbation: Secondary | ICD-10-CM

## 2020-12-31 DIAGNOSIS — R482 Apraxia: Secondary | ICD-10-CM

## 2020-12-31 DIAGNOSIS — M5136 Other intervertebral disc degeneration, lumbar region: Secondary | ICD-10-CM

## 2020-12-31 DIAGNOSIS — E785 Hyperlipidemia, unspecified: Secondary | ICD-10-CM

## 2020-12-31 DIAGNOSIS — F32A Depression, unspecified: Secondary | ICD-10-CM

## 2020-12-31 DIAGNOSIS — G809 Cerebral palsy, unspecified: Secondary | ICD-10-CM

## 2020-12-31 DIAGNOSIS — I25118 Atherosclerotic heart disease of native coronary artery with other forms of angina pectoris: Secondary | ICD-10-CM

## 2020-12-31 DIAGNOSIS — I252 Old myocardial infarction: Secondary | ICD-10-CM

## 2020-12-31 DIAGNOSIS — E538 Deficiency of other specified B group vitamins: Secondary | ICD-10-CM

## 2020-12-31 DIAGNOSIS — I10 Essential (primary) hypertension: Secondary | ICD-10-CM

## 2020-12-31 NOTE — Patient Instructions (Signed)
Visit Information  PATIENT GOALS:  Goals Addressed             This Visit's Progress    Manage My Emotions; Manage depression issues. Complete ADLs daily with assistance as needed       Timeframe:  Short-Term Goal Priority:  Medium Progress: On Track Start Date:            12/31/20                Expected End Date:          03/31/21            Follow Up Date 01/13/21 at 2:15 PM   Manage emotions. Manage depression issues . Complete ADLs daily with assistance as needed   Why is this important?   When you are stressed, down or upset, your body reacts too.  For example, your blood pressure may get higher; you may have a headache or stomachache.  When your emotions get the best of you, your body's ability to fight off cold and flu gets weak.  These steps will help you manage your emotions.     Patient Self Care Activities:  Self administers medications as prescribed Attends all scheduled provider appointments  Patient Coping Strengths:  Family Friends  Patient Self Care Deficits:  Mobility issues Depression issues Anxiety issues  Patient Goals:  - spend time or talk with others at least 2 to 3 times per week - practice relaxation or meditation daily - keep a calendar with appointment dates  Follow Up Plan: [name] to call client on 01/13/21 at 2:15 PM to assess client needs    Orlan Rychlik.Akirra Lacerda MSW, [name] Licensed Clinical Social Worker [facility] Care Management [phone]

## 2020-12-31 NOTE — Telephone Encounter (Signed)
[name], our clinical social worker, asked that I relay some information to you about this patient.  [name] has recently spoken with [name] and his home health aide about the care that is being done for him.  The home health aide is doing a wonderful job with [name] and takes very good care of the needs in the home.  She is doing about 17 hours per week now but [name] recommends that those hours be increased because of the amount of work she is doing and how much it is helping the patient.  The aide does all of his grocery shopping, pays bills for him, cleans house, does cooking, and also bathes him.  [name] feels that her hours could be increased because of this and also [name] has had some functional decline such as some urinary and fecal incontinence, and more difficulty when walking.  The patient has an appointment scheduled with you to discuss this on 01/28/21 but [name] wanted to  make you aware before then.

## 2020-12-31 NOTE — Telephone Encounter (Signed)
12/31/2020  Patient scheduled for [facility] telephone appointment for tomorrow.  [name], BSN, RN-BC Embedded Chronic Care Manager [facility] [place] Management Direct Dial: [phone]

## 2020-12-31 NOTE — Chronic Care Management (AMB) (Signed)
Chronic Care Management    Clinical Social Work Note  12/31/2020 Name: [name] MRN: [id number] DOB: 1955/09/24  [name] is a 65 y.o. year old male who is a primary care patient of [name], [name], MD. The CCM team was consulted to assist the patient with chronic disease management and/or care coordination needs related to: [name] .   Engaged with patient  and engaged with CNA [place], by telephone for follow up visit in response to provider referral for social work chronic care management and care coordination services.   Consent to Services:  The patient was given information about Chronic Care Management services, agreed to services, and gave verbal consent prior to initiation of services.  Please see initial visit note for detailed documentation.   Patient agreed to services and consent obtained.   Assessment: Review of patient past medical history, allergies, medications, and health status, including review of relevant consultants reports was performed today as part of a comprehensive evaluation and provision of chronic care management and care coordination services.     SDOH (Social Determinants of Health) assessments and interventions performed:  SDOH Interventions    Flowsheet Row Most Recent Value  SDOH Interventions   Social Connections Interventions Other (Comment)  [client has weekly social interaction with his home health aide who helps him in the home. Client has reduced family support]  Depression Interventions/Treatment  Currently on Treatment        Advanced Directives Status: See Vyn[employer] application for related entries.  CCM Care Plan  Allergies  Allergen Reactions   Chantix [Varenicline Tartrate] Nausea And Vomiting    Outpatient Encounter Medications as of 12/31/2020  Medication Sig   acetaminophen (TYLENOL) 325 MG tablet Take 2 tablets (650 mg total) by mouth every 6 (six) hours as needed for mild pain or headache.   albuterol (PROAIR  HFA) 108 (90 Base) MCG/ACT inhaler 1-2 puffs every 6 hours as needed wheezing or shortness of breath.   aspirin EC 81 MG tablet Take 1 tablet (81 mg total) by mouth daily.   atorvastatin (LIPITOR) 40 MG tablet TAKE 1 TABLET ONCE DAILY AT 6PM   buPROPion (WELLBUTRIN SR) 150 MG 12 hr tablet Take 1 tablet (150 mg total) by mouth daily with breakfast.   celecoxib (CELEBREX) 200 MG capsule Take 1 capsule (200 mg total) by mouth daily as needed.   cetaphil (CETAPHIL) lotion Apply 1 application topically 2 (two) times daily. For rash. Apply after washing with [facility] shower gel and rinsing with clear warm water.   cyclobenzaprine (FLEXERIL) 5 MG tablet Take 1 tablet (5 mg total) by mouth 3 (three) times daily as needed for muscle spasms.   diclofenac (VOLTAREN) 75 MG EC tablet Take 1 tablet (75 mg total) by mouth 2 (two) times daily.   DULoxetine (CYMBALTA) 60 MG capsule Take 1 capsule (60 mg total) by mouth daily. WITH A FULL STOMACH AT SUPPER TIME   fexofenadine (ALLEGRA) 180 MG tablet Take 1 tablet (180 mg total) by mouth daily. For allergy symptoms   fluocinonide-emollient (LIDEX-E) 0.05 % cream Apply 1 application topically 2 (two) times daily. To affected areas   fluticasone (FLONASE) 50 MCG/ACT nasal spray Place 2 sprays into both nostrils daily as needed for allergies or rhinitis.   fluticasone furoate-vilanterol (BREO ELLIPTA) 100-25 MCG/INH AEPB Inhale 1 puff into the lungs daily.   Ipratropium-Albuterol (COMBIVENT RESPIMAT) 20-100 MCG/ACT AERS respimat Inhale 1 puff into the lungs every 6 (six) hours   isosorbide mononitrate (IMDUR)  60 MG 24 hr tablet Take 1 tablet (60 mg total) by mouth daily.   metoprolol tartrate (LOPRESSOR) 25 MG tablet Take 1 tablet (25 mg total) by mouth 2 (two) times daily. (NEEDS TO BE SEEN BEFORE NEXT REFILL)   mirtazapine (REMERON) 15 MG tablet Take 1 tablet (15 mg total) by mouth at bedtime. For sleep   nitroGLYCERIN (NITROSTAT) 0.4 MG SL tablet Place 1 tablet  (0.4 mg total) under the tongue every 5 (five) minutes x 3 doses as needed for chest pain.   pantoprazole (PROTONIX) 40 MG tablet TAKE (1) TABLET TWICE A DAY.   tamsulosin (FLOMAX) 0.4 MG CAPS capsule Take 2 capsules (0.8 mg total) by mouth daily after supper.   Facility-Administered Encounter Medications as of 12/31/2020  Medication   cyanocobalamin ((VITAMIN B-12)) injection 1,000 mcg    Patient Active Problem List   Diagnosis Date Noted   Back pain without sciatica 10/31/2020   Degenerative disc disease, lumbar 01/16/2019   Depression    Hypokalemia 06/27/2018   Seizure disorder ([place]) 02/07/2018   Apraxia of speech 10/13/2017   Left-sided weakness 10/13/2017   Coronary artery disease of native artery of native heart with stable angina pectoris ([place])    Illiteracy 10/23/2016   Carpal tunnel syndrome of right wrist 06/15/2016   GAD (generalized anxiety disorder) 05/01/2016   History of MI (myocardial infarction) 02/26/2015   Dyslipidemia, goal LDL below 70 10/08/2009   Infantile cerebral palsy ([place]) 10/08/2009   Essential hypertension 10/08/2009    Conditions to be addressed/monitored: monitor client completion of ADLs. Monitor client management of depression issues  Care Plan : [name]  Updates made by [name], [name] since 12/31/2020 12:00 AM     Problem: Functional Decline      Goal: Mobility and Function Maintained: needs help with ADLs ; needs help with daily activities   Start Date: 12/31/2020  Expected End Date: 03/31/2021  This Visit's Progress: On track  Priority: High  Note:   Current barriers:   Patient in need of assistance with connecting to community resources for possible help with ADLs completion Patient is unable to independently navigate community resource options without care coordination support Mobility issues Pain issues Illiteracy challenges  Clinical Goals:  [name] will communicate with client in next 30 days to discuss ADLs  completion of client [name] will talk with client in next 30 days about mobility challenges of client Client will call RNCM as needed in next 30 days for CCM nursing support  Clinical Interventions:  Collaboration with [name], MD regarding development and update of comprehensive plan of care as evidenced by provider attestation and co-signature Assessment of needs, barriers of client  Talked with client about client needs related to in home care support With permission of client, [name] spoke with Theadora Rama CNA who assists client in the home. She reported that client is having more episodes of incontinence recently. She said she pays clients bills for him, helps him shop for groceries, helps client with bathing, cooks some meals for client, and ensures his in home care needs are met. She said that client has little family support at present.  [name] informed client and Theadora Rama that B12 shot appointment is scheduled for client on 01/13/21 and [name] informed Priest and Theadora Rama that [name] will provide client transport to and from appointment [name] informed client and Theadora Rama that client has appointment with Dr. Livia Snellen on 01/28/21 to discuss in home care needs of client and support he receives from  CNA. Encouraged client to call RNCM or [name] as needed for CCM support  Patient Strengths: Has support from CNA each week Attends scheduled medical appointments  Patient Deficits: Memory issues Illiteracy challenges (he states that he has trouble remembering details like dates and times for appointments) Walking challenges  Patient Goals:  Attend scheduled medical appointments in next 30 days Call RNCM as needed in next 30 days for CCM nursing support Cooperate with CNA in next 30 days in addressing in home care needs of client -  Follow Up Plan: [name] to call client on 01/13/21 at 2:15 PM    [name].Farha Dano MSW, [name] Licensed Clinical Social Worker [facility] Care Management [phone]

## 2020-12-31 NOTE — Telephone Encounter (Signed)
That sounds great. Let me know what you need me to do. 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 [name]

## 2020-12-31 NOTE — Telephone Encounter (Signed)
Cathy, when you get back, can you look into this and see what Dr. Livia Snellen needs to do to increase the hours for patient's aide.

## 2021-01-01 ENCOUNTER — Ambulatory Visit: Payer: Medicare Other | Admitting: *Deleted

## 2021-01-01 DIAGNOSIS — G809 Cerebral palsy, unspecified: Secondary | ICD-10-CM

## 2021-01-01 DIAGNOSIS — I10 Essential (primary) hypertension: Secondary | ICD-10-CM

## 2021-01-01 DIAGNOSIS — I25118 Atherosclerotic heart disease of native coronary artery with other forms of angina pectoris: Secondary | ICD-10-CM

## 2021-01-01 NOTE — Telephone Encounter (Signed)
In order to fill out new PCS form pt has to be seen within 90 days, pt has appt on 01/28/21 with Dr. Livia Snellen. Nicki Reaper will arrange transportation for pt to get to this appointment too.

## 2021-01-02 NOTE — Patient Instructions (Signed)
Visit Information  PATIENT GOALS:  Goals Addressed             This Visit's Progress    Fall Prevention and Improved Strength and Balance   Not on track    Timeframe:  Long-Range Goal Priority:  High Start Date: 05/30/20                            Expected End Date: 04/19/21  Follow-up: 02/04/21             Use cane for ambulation Move carefully and change positions slowly in order to decrease fall risk Get outside everyday Increase physical activity level slowly and carefully with an ultimate goal of 150 minutes a week Keep phone with you at all times in case you fall Keep a clear path through your home Know where your dog is at all times so you don't fall over him Walk/exercise when someone is at home with you Call RN Care Manager as needed [phone]     Improve My Heart Health-Coronary Artery Disease   On track    Timeframe:  Long-Range Goal Priority:  Medium Start Date:  06/10/20                          Expected End Date:  04/19/21                     Follow Up Date 02/04/21  Know and watch for signs of a heart attack Call 911 if you have those symptoms Take all of your medications each day Eat plenty of vegetables, fruits, and lean protein Do not eat a lot of sugary, fried, or fatty foods Keep all medical appointments Call Dr Percival Spanish as needed at [phone] Walk daily. Use a cane if needed for balance.  Keep phone with you at all times in case you fall Call RN Care Manager as needed [phone]     Why is this important?   Lifestyle changes are key to improving the blood flow to your heart. Think about the things you can change and set a goal to live healthy.  Remember, when the blood vessels to your heart start to get clogged you may not have any symptoms.  Over time, they can get worse.  Don't ignore the signs, like chest pain, and get help right away.     Notes:      Increase Personal Care Services Hours; Receive in home care weekly as needed  through home health agency   On track    Timeframe:  Short-Term Goal Priority:  Medium Progress: On Track Start Date:   01/02/21                Expected End Date:   03/04/2021      Follow-up: 02/04/21  Keep appointment with Dr Livia Snellen on 01/28/21 for evaluation to complete PCS application for increased hours Talk with Dr Livia Snellen about what all Theadora Rama helps you with Talk with Dr Livia Snellen about any new or worsening problems Call [place] as needed [phone] Talk with [name] about transportation to appointment     Track and Manage My Blood Pressure-Hypertension   On track    Timeframe:  Long-Range Goal Priority:  Medium Start Date:  Expected End Date:                       Follow Up Date 02/04/21   Take prepackaged medication as prescribed Check and write down blood pressure everyday and as needed Call your doctor if your blood pressure is over 140/90 or lower than 100/70 Eat a low sodium diet. Do not add extra salt to food Keep all medical appointments Move around more but be careful not to fall    Why is this important?   You won't feel high blood pressure, but it can still hurt your blood vessels.  High blood pressure can cause heart or kidney problems. It can also cause a stroke.  Making lifestyle changes like losing a little weight or eating less salt will help.  Checking your blood pressure at home and at different times of the day can help to control blood pressure.  If the doctor prescribes medicine remember to take it the way the doctor ordered.  Call the office if you cannot afford the medicine or if there are questions about it.     Notes:         The patient verbalized understanding of instructions, educational materials, and care plan provided today and declined offer to receive copy of patient instructions, educational materials, and care plan.    Plan:Telephone follow up appointment with care management team member scheduled  for:  02/04/21 with RNCM and The patient has been provided with contact information for the care management team and has been advised to call with any health related questions or concerns.   [name], BSN, RN-BC Embedded Chronic Care Manager [facility] / [place] Management Direct Dial: [phone]

## 2021-01-02 NOTE — Chronic Care Management (AMB) (Signed)
Chronic Care Management   CCM RN Visit Note  01/01/2021 Name: [name] MRN: [id number] DOB: 02-Nov-1955  Subjective: [name] is a 65 y.o. year old male who is a primary care patient of [name], [name], MD. The care management team was consulted for assistance with disease management and care coordination needs.    Engaged with patient by telephone for follow up visit in response to provider referral for case management and/or care coordination services.   Consent to Services:  The patient was given information about Chronic Care Management services, agreed to services, and gave verbal consent prior to initiation of services.  Please see initial visit note for detailed documentation.   Patient agreed to services and verbal consent obtained.   Assessment: Review of patient past medical history, allergies, medications, health status, including review of consultants reports, laboratory and other test data, was performed as part of comprehensive evaluation and provision of chronic care management services.   SDOH (Social Determinants of Health) assessments and interventions performed:    CCM Care Plan  Allergies  Allergen Reactions   Chantix [Varenicline Tartrate] Nausea And Vomiting    Outpatient Encounter Medications as of 01/01/2021  Medication Sig   acetaminophen (TYLENOL) 325 MG tablet Take 2 tablets (650 mg total) by mouth every 6 (six) hours as needed for mild pain or headache.   albuterol (PROAIR HFA) 108 (90 Base) MCG/ACT inhaler 1-2 puffs every 6 hours as needed wheezing or shortness of breath.   aspirin EC 81 MG tablet Take 1 tablet (81 mg total) by mouth daily.   atorvastatin (LIPITOR) 40 MG tablet TAKE 1 TABLET ONCE DAILY AT 6PM   buPROPion (WELLBUTRIN SR) 150 MG 12 hr tablet Take 1 tablet (150 mg total) by mouth daily with breakfast.   celecoxib (CELEBREX) 200 MG capsule Take 1 capsule (200 mg total) by mouth daily as needed.   cetaphil (CETAPHIL) lotion Apply 1  application topically 2 (two) times daily. For rash. Apply after washing with [facility] shower gel and rinsing with clear warm water.   cyclobenzaprine (FLEXERIL) 5 MG tablet Take 1 tablet (5 mg total) by mouth 3 (three) times daily as needed for muscle spasms.   diclofenac (VOLTAREN) 75 MG EC tablet Take 1 tablet (75 mg total) by mouth 2 (two) times daily.   DULoxetine (CYMBALTA) 60 MG capsule Take 1 capsule (60 mg total) by mouth daily. WITH A FULL STOMACH AT SUPPER TIME   fexofenadine (ALLEGRA) 180 MG tablet Take 1 tablet (180 mg total) by mouth daily. For allergy symptoms   fluocinonide-emollient (LIDEX-E) 0.05 % cream Apply 1 application topically 2 (two) times daily. To affected areas   fluticasone (FLONASE) 50 MCG/ACT nasal spray Place 2 sprays into both nostrils daily as needed for allergies or rhinitis.   fluticasone furoate-vilanterol (BREO ELLIPTA) 100-25 MCG/INH AEPB Inhale 1 puff into the lungs daily.   Ipratropium-Albuterol (COMBIVENT RESPIMAT) 20-100 MCG/ACT AERS respimat Inhale 1 puff into the lungs every 6 (six) hours   isosorbide mononitrate (IMDUR) 60 MG 24 hr tablet Take 1 tablet (60 mg total) by mouth daily.   metoprolol tartrate (LOPRESSOR) 25 MG tablet Take 1 tablet (25 mg total) by mouth 2 (two) times daily. (NEEDS TO BE SEEN BEFORE NEXT REFILL)   mirtazapine (REMERON) 15 MG tablet Take 1 tablet (15 mg total) by mouth at bedtime. For sleep   nitroGLYCERIN (NITROSTAT) 0.4 MG SL tablet Place 1 tablet (0.4 mg total) under the tongue every 5 (five) minutes x  3 doses as needed for chest pain.   pantoprazole (PROTONIX) 40 MG tablet TAKE (1) TABLET TWICE A DAY.   tamsulosin (FLOMAX) 0.4 MG CAPS capsule Take 2 capsules (0.8 mg total) by mouth daily after supper.   Facility-Administered Encounter Medications as of 01/01/2021  Medication   cyanocobalamin ((VITAMIN B-12)) injection 1,000 mcg    Patient Active Problem List   Diagnosis Date Noted   Back pain without sciatica  10/31/2020   Degenerative disc disease, lumbar 01/16/2019   Depression    Hypokalemia 06/27/2018   Seizure disorder ([place]) 02/07/2018   Apraxia of speech 10/13/2017   Left-sided weakness 10/13/2017   Coronary artery disease of native artery of native heart with stable angina pectoris ([place])    Illiteracy 10/23/2016   Carpal tunnel syndrome of right wrist 06/15/2016   GAD (generalized anxiety disorder) 05/01/2016   History of MI (myocardial infarction) 02/26/2015   Dyslipidemia, goal LDL below 70 10/08/2009   Infantile cerebral palsy ([place]) 10/08/2009   Essential hypertension 10/08/2009    Conditions to be addressed/monitored:CAD, HTN, and cerebral palsy  Care Plan : [facility] Care Plan     Problem: Chronic Disease Management Needs   Priority: Medium     Long-Range Goal: Patient will Work With Consulting civil engineer Regarding Care Management and Care Coordination Associated with Chronic Medical Conditions   This Visit's Progress: On track  Priority: Medium  Note:   Current Barriers:  Care Coordination needs related to Transportation, Level of care concerns, ADL IADL limitations, Family and relationship dysfunction, Social Isolation, Literacy concerns, Inability to perform ADL's independently, and Inability to perform IADL's independently  Chronic Disease Management support and education needs related to CAD, HTN, Pulmonary Disease, and Cerebral Palsy Lacks caregiver support.  Unable to independently manage his medical conditions Transportation needs  RNCM Clinical Goal(s):  Patient will take all medications exactly as prescribed and will call provider for medication related questions continue to work with RN Care Manager to address care management and care coordination needs related to CAD, HTN, Pulmonary Disease, and cerebral palsy  work with Education officer, museum to address Limited social support, Transportation, Level of care concerns, ADL IADL limitations, Social Isolation, Literacy concerns,  Cognitive Deficits, and Memory Deficits related to the management of cerebral palsy  through collaboration with Consulting civil engineer, provider, and care team.   Interventions: 1:1 collaboration with primary care provider regarding development and update of comprehensive plan of care as evidenced by provider attestation and co-signature Inter-disciplinary care team collaboration (see longitudinal plan of care) Evaluation of current treatment plan related to  self management and patient's adherence to plan as established by provider Provided with RNCM contact number and encouraged to reach out as needed  Hypertension: (Status: Goal on track: YES.) Last practice recorded BP readings:  BP Readings from Last 3 Encounters:  10/31/20 114/64  07/17/20 118/78  05/09/20 134/79  Evaluation of current treatment plan related to hypertension and patient's adherence to plan as established by provider. Reviewed medications Medications are prepackaged for the month and delivered by [facility] Patient is compliant with medications No problems with affordability. Has Medicaid secondary which covers medication cost. Advised patient to check and record blood pressure daily and as needed Advised to call PCP with any blood pressure readings that are outside of recommended range Bring log to PCP and cardiology appointments Recommended DASH diet Reviewed upcoming appointments Discussed transportation through [name]   CAD  (Status: Goal on track: YES.) Assessed understanding of CAD diagnosis Medications reviewed including  medications utilized in CAD treatment plan Evaluation of current treatment plan related to CAD and patient's adherence to plan as established by provider. Has not had to take nitro "in a while" Reviewed s/s of a heart attack No chest pain or pressure No shortness of breath Encouraged increasing physical activity as tolerated Patient feels weak after walking short distances. He does try to  walk some in his home. Encouraged to slowly increase the distance he walks in his home  Fall prevention discussed Discussed diet Has mostly frozen meals for convenience Reinforced need to limit fried, fatty, and sugary foods and to eat vegetables, fruits, and lean proteins Reinforced to call 911 if he has any s/s of a heart attack   Cerebral Palsy:  (Status: Goal on track: YES.) Family/social support discussed Patient has two sisters but they are not very involved in his care. He may talk with them once a month by telephone.  Discussed Personal Care Services [facility]) Has an aid, Theadora Rama, that comes Mon-Fri for a few hours each day. Totals 80 hrs a month. [place] [phone]) Theadora Rama assists with ADLs, bill payment, grocery shopping, and transport to appointments He feels very comfortable with her and says that if it wasn't for her, he wouldn't be able to care for himself Previously collaborated with PCP office, [employer], and [place] to increase hours from 55 hrs a month to 80. Patient is now requesting additional hours.  Consulted by [name] regarding need for additional hours. Advised on PCS form process. Patient scheduled for office visit with PCP in a few weeks to re-evaluate and complete form. [name] to assist clinical staff with completion. Fall assessment complete [place] getting out of bed two weeks ago. No injury.  Verbal education on fall prevention strategies given Discussed mobility and ambulation Does feel weak when walking. Can't walk very far.  Using a cane Encouraged to do physical therapy exercises that he was given months ago Encouraged to slowly increase the distance he walks inside of his home to build up stamina and strength Encouraged to walk/exercise when he's not at home alone Discussed how sedentary he is and if he doesn't move around more, he'll just continue to decondition and physically decline Provided lots of  encouragement Strongly advised to be completely open/honest with PCP about his limitations and any new or worsening problems. In order to qualify for more PCS hours, there has to be a change in status.  Therapeutic listening utilized regarding physical struggles, general health, and family/social dynamics   Patient Goals/Self-Care Activities: Patient will self administer medications as prescribed Patient will call pharmacy for medication refills Patient will continue to perform ADL's independently Patient will call provider office for new concerns or questions     Plan:Telephone follow up appointment with care management team member scheduled for:  02/04/21 with RNCM and The patient has been provided with contact information for the care management team and has been advised to call with any health related questions or concerns.   [name], BSN, RN-BC Embedded Chronic Care Manager [facility] / [place] Management Direct Dial: [phone]

## 2021-01-03 ENCOUNTER — Other Ambulatory Visit: Payer: Self-pay | Admitting: Family Medicine

## 2021-01-03 DIAGNOSIS — I209 Angina pectoris, unspecified: Secondary | ICD-10-CM

## 2021-01-03 DIAGNOSIS — I25118 Atherosclerotic heart disease of native coronary artery with other forms of angina pectoris: Secondary | ICD-10-CM

## 2021-01-03 DIAGNOSIS — I10 Essential (primary) hypertension: Secondary | ICD-10-CM

## 2021-01-10 ENCOUNTER — Ambulatory Visit: Payer: Medicare Other | Admitting: Licensed Clinical Social Worker

## 2021-01-10 DIAGNOSIS — E538 Deficiency of other specified B group vitamins: Secondary | ICD-10-CM

## 2021-01-10 DIAGNOSIS — J441 Chronic obstructive pulmonary disease with (acute) exacerbation: Secondary | ICD-10-CM

## 2021-01-10 DIAGNOSIS — R482 Apraxia: Secondary | ICD-10-CM

## 2021-01-10 DIAGNOSIS — I209 Angina pectoris, unspecified: Secondary | ICD-10-CM

## 2021-01-10 DIAGNOSIS — I25118 Atherosclerotic heart disease of native coronary artery with other forms of angina pectoris: Secondary | ICD-10-CM

## 2021-01-10 DIAGNOSIS — F411 Generalized anxiety disorder: Secondary | ICD-10-CM

## 2021-01-10 DIAGNOSIS — I252 Old myocardial infarction: Secondary | ICD-10-CM

## 2021-01-10 DIAGNOSIS — G809 Cerebral palsy, unspecified: Secondary | ICD-10-CM

## 2021-01-10 DIAGNOSIS — M5136 Other intervertebral disc degeneration, lumbar region: Secondary | ICD-10-CM

## 2021-01-10 DIAGNOSIS — I10 Essential (primary) hypertension: Secondary | ICD-10-CM

## 2021-01-10 DIAGNOSIS — E785 Hyperlipidemia, unspecified: Secondary | ICD-10-CM

## 2021-01-10 DIAGNOSIS — M51369 Other intervertebral disc degeneration, lumbar region without mention of lumbar back pain or lower extremity pain: Secondary | ICD-10-CM

## 2021-01-10 DIAGNOSIS — F32A Depression, unspecified: Secondary | ICD-10-CM

## 2021-01-10 DIAGNOSIS — Z550 Illiteracy and low-level literacy: Secondary | ICD-10-CM

## 2021-01-10 NOTE — Patient Instructions (Addendum)
Visit Information  PATIENT GOALS:  Goals Addressed Manage emotions; manage depression issues;complete ADLs daily with assistance as needed    Timeframe:  Short-Term Goal Priority:  Medium Progress: On Track Start Date:            12/31/20                Expected End Date:          03/31/21            Follow Up Date 01/13/21 at 2:15 PM   Manage emotions. Manage depression issues . Complete ADLs daily with assistance as needed   Why is this important?   When you are stressed, down or upset, your body reacts too.  For example, your blood pressure may get higher; you may have a headache or stomachache.  When your emotions get the best of you, your body's ability to fight off cold and flu gets weak.  These steps will help you manage your emotions.     Patient Self Care Activities:  Self administers medications as prescribed Attends all scheduled provider appointments  Patient Coping Strengths:  Family Friends  Patient Self Care Deficits:  Mobility issues Depression issues Anxiety issues  Patient Goals:  - spend time or talk with others at least 2 to 3 times per week - practice relaxation or meditation daily - keep a calendar with appointment dates  Follow Up Plan: [name] to call client on 01/13/21 at 2:15 PM to assess client needs   Rasool Rommel.Jeremyah Jelley MSW, [name] Licensed Clinical Social Worker [facility] Care Management [phone]

## 2021-01-10 NOTE — Chronic Care Management (AMB) (Signed)
Chronic Care Management    Clinical Social Work Note  01/10/2021 Name: [name] MRN: [id number] DOB: 24-Oct-1955  [name] is a 65 y.o. year old male who is a primary care patient of [name], [name], MD. The CCM team was consulted to assist the patient with chronic disease management and/or care coordination needs related to: [name] .   Engaged with patient by telephone for follow up visit in response to provider referral for social work chronic care management and care coordination services.   Consent to Services:  The patient was given information about Chronic Care Management services, agreed to services, and gave verbal consent prior to initiation of services.  Please see initial visit note for detailed documentation.   Patient agreed to services and consent obtained.   Assessment: Review of patient past medical history, allergies, medications, and health status, including review of relevant consultants reports was performed today as part of a comprehensive evaluation and provision of chronic care management and care coordination services.     SDOH (Social Determinants of Health) assessments and interventions performed:  SDOH Interventions    Flowsheet Row Most Recent Value  SDOH Interventions   Physical Activity Interventions Other (Comments)  [walking challenges, Knees get weak,  client gets fatigued]  Depression Interventions/Treatment  Currently on Treatment        Advanced Directives Status: See Vynca application for related entries.  CCM Care Plan  Allergies  Allergen Reactions   Chantix [Varenicline Tartrate] Nausea And Vomiting    Outpatient Encounter Medications as of 01/10/2021  Medication Sig   acetaminophen (TYLENOL) 325 MG tablet Take 2 tablets (650 mg total) by mouth every 6 (six) hours as needed for mild pain or headache.   albuterol (PROAIR HFA) 108 (90 Base) MCG/ACT inhaler 1-2 puffs every 6 hours as needed wheezing or shortness of  breath.   aspirin EC 81 MG tablet Take 1 tablet (81 mg total) by mouth daily.   atorvastatin (LIPITOR) 40 MG tablet TAKE 1 TABLET ONCE DAILY AT 6PM   buPROPion (WELLBUTRIN SR) 150 MG 12 hr tablet Take 1 tablet (150 mg total) by mouth daily with breakfast.   celecoxib (CELEBREX) 200 MG capsule Take 1 capsule (200 mg total) by mouth daily as needed.   cetaphil (CETAPHIL) lotion Apply 1 application topically 2 (two) times daily. For rash. Apply after washing with [facility] shower gel and rinsing with clear warm water.   cyclobenzaprine (FLEXERIL) 5 MG tablet Take 1 tablet (5 mg total) by mouth 3 (three) times daily as needed for muscle spasms.   diclofenac (VOLTAREN) 75 MG EC tablet Take 1 tablet (75 mg total) by mouth 2 (two) times daily.   DULoxetine (CYMBALTA) 60 MG capsule Take 1 capsule (60 mg total) by mouth daily. WITH A FULL STOMACH AT SUPPER TIME   fexofenadine (ALLEGRA) 180 MG tablet Take 1 tablet (180 mg total) by mouth daily. For allergy symptoms   fluocinonide-emollient (LIDEX-E) 0.05 % cream Apply 1 application topically 2 (two) times daily. To affected areas   fluticasone (FLONASE) 50 MCG/ACT nasal spray Place 2 sprays into both nostrils daily as needed for allergies or rhinitis.   fluticasone furoate-vilanterol (BREO ELLIPTA) 100-25 MCG/INH AEPB Inhale 1 puff into the lungs daily.   Ipratropium-Albuterol (COMBIVENT RESPIMAT) 20-100 MCG/ACT AERS respimat Inhale 1 puff into the lungs every 6 (six) hours   isosorbide mononitrate (IMDUR) 60 MG 24 hr tablet Take 1 tablet (60 mg total) by mouth daily.   metoprolol tartrate (  LOPRESSOR) 25 MG tablet TAKE (1) TABLET TWICE A DAY.   mirtazapine (REMERON) 15 MG tablet Take 1 tablet (15 mg total) by mouth at bedtime. For sleep   nitroGLYCERIN (NITROSTAT) 0.4 MG SL tablet Place 1 tablet (0.4 mg total) under the tongue every 5 (five) minutes x 3 doses as needed for chest pain.   pantoprazole (PROTONIX) 40 MG tablet TAKE (1) TABLET TWICE A DAY.    tamsulosin (FLOMAX) 0.4 MG CAPS capsule Take 2 capsules (0.8 mg total) by mouth daily after supper.   Facility-Administered Encounter Medications as of 01/10/2021  Medication   cyanocobalamin ((VITAMIN B-12)) injection 1,000 mcg    Patient Active Problem List   Diagnosis Date Noted   Back pain without sciatica 10/31/2020   Degenerative disc disease, lumbar 01/16/2019   Depression    Hypokalemia 06/27/2018   Seizure disorder ([place]) 02/07/2018   Apraxia of speech 10/13/2017   Left-sided weakness 10/13/2017   Coronary artery disease of native artery of native heart with stable angina pectoris ([place])    Illiteracy 10/23/2016   Carpal tunnel syndrome of right wrist 06/15/2016   GAD (generalized anxiety disorder) 05/01/2016   History of MI (myocardial infarction) 02/26/2015   Dyslipidemia, goal LDL below 70 10/08/2009   Infantile cerebral palsy ([place]) 10/08/2009   Essential hypertension 10/08/2009    Conditions to be addressed/monitored: monitor client management of depression and anxiety issues  Care Plan : [name]  Updates made by [name], [name] since 01/10/2021 12:00 AM     Problem: Emotional Distress      Goal: Emotional Health Supported;Manage ADLs; Manage depression issues   Start Date: 01/10/2021  Expected End Date: 04/08/2021  This Visit's Progress: On track  Recent Progress: On track  Priority: Medium  Note:   Current Barriers:  Chronic Mental Health needs related to depression management and anxiety management Mobility issues Suicidal Ideation/Homicidal Ideation: No  Clinical Social Work Goal(s):  patient will work with SW monthly by telephone or in person to reduce or manage symptoms related to depression and anxiety issues Patient will call RNCM or [name] as needed in next 30 days for CCM support  Interventions: 1:1 collaboration with [name], MD regarding development and update of comprehensive plan of care as evidenced by provider  attestation and co-signature Talked with client about food provision of client Talked with client about sleeping issues of client Talked with client about pain issues of client Talked with client about mobility of client Reminded client of his appointment on 01/13/21 at 11:00 at [facility] for B-12 injection Reminded client that [name] was providing transport support for him on 01/13/21 for his appointment at [facility] Talked with client about his decreased energy and occasional fatigue.   Talked with client about appetite of client Talked with client about home health support with CNA, Brandy.  Patient Self Care Activities:  Self administers medications as prescribed Attends all scheduled provider appointments Performs ADL's independently  Patient Coping Strengths:  Family  Patient Self Care Deficits:  Mobility issues Needs some help occasionally with in home care needs  Patient Goals:  - spend time or talk with others at least 2 to 3 times per week - practice relaxation or meditation daily - keep a calendar with appointment dates  Follow Up Plan: [name] to call client on 01/13/21 at 2:15 PM to assess client needs      Daelan Gatt.Myrian Botello MSW, [name] Licensed Clinical Social Worker [facility] Care Management [phone]

## 2021-01-13 ENCOUNTER — Ambulatory Visit (INDEPENDENT_AMBULATORY_CARE_PROVIDER_SITE_OTHER): Payer: Medicare Other | Admitting: *Deleted

## 2021-01-13 ENCOUNTER — Other Ambulatory Visit: Payer: Self-pay

## 2021-01-13 ENCOUNTER — Ambulatory Visit: Payer: Medicare Other | Admitting: Licensed Clinical Social Worker

## 2021-01-13 DIAGNOSIS — I209 Angina pectoris, unspecified: Secondary | ICD-10-CM

## 2021-01-13 DIAGNOSIS — F411 Generalized anxiety disorder: Secondary | ICD-10-CM

## 2021-01-13 DIAGNOSIS — G809 Cerebral palsy, unspecified: Secondary | ICD-10-CM

## 2021-01-13 DIAGNOSIS — I252 Old myocardial infarction: Secondary | ICD-10-CM

## 2021-01-13 DIAGNOSIS — E538 Deficiency of other specified B group vitamins: Secondary | ICD-10-CM

## 2021-01-13 DIAGNOSIS — J441 Chronic obstructive pulmonary disease with (acute) exacerbation: Secondary | ICD-10-CM

## 2021-01-13 DIAGNOSIS — R482 Apraxia: Secondary | ICD-10-CM

## 2021-01-13 DIAGNOSIS — E785 Hyperlipidemia, unspecified: Secondary | ICD-10-CM

## 2021-01-13 DIAGNOSIS — Z23 Encounter for immunization: Secondary | ICD-10-CM | POA: Diagnosis not present

## 2021-01-13 DIAGNOSIS — I10 Essential (primary) hypertension: Secondary | ICD-10-CM

## 2021-01-13 DIAGNOSIS — I25118 Atherosclerotic heart disease of native coronary artery with other forms of angina pectoris: Secondary | ICD-10-CM

## 2021-01-13 DIAGNOSIS — F32A Depression, unspecified: Secondary | ICD-10-CM

## 2021-01-13 DIAGNOSIS — K219 Gastro-esophageal reflux disease without esophagitis: Secondary | ICD-10-CM

## 2021-01-13 DIAGNOSIS — M5136 Other intervertebral disc degeneration, lumbar region: Secondary | ICD-10-CM

## 2021-01-13 DIAGNOSIS — Z550 Illiteracy and low-level literacy: Secondary | ICD-10-CM

## 2021-01-13 DIAGNOSIS — F3341 Major depressive disorder, recurrent, in partial remission: Secondary | ICD-10-CM

## 2021-01-13 DIAGNOSIS — M51369 Other intervertebral disc degeneration, lumbar region without mention of lumbar back pain or lower extremity pain: Secondary | ICD-10-CM

## 2021-01-13 NOTE — Patient Instructions (Signed)
Visit Information  PATIENT GOALS:  Goals Addressed             This Visit's Progress    Manage My Emotions; Manage depression issues. Complete ADLs daily with assistance as needed       Timeframe:  Short-Term Goal Priority:  Medium Progress: On Track Start Date:            12/31/20                Expected End Date:          03/31/21            Follow Up Date 02/27/21 at 3:00 PM   Manage emotions. Manage depression issues . Complete ADLs daily with assistance as needed   Why is this important?   When you are stressed, down or upset, your body reacts too.  For example, your blood pressure may get higher; you may have a headache or stomachache.  When your emotions get the best of you, your body's ability to fight off cold and flu gets weak.  These steps will help you manage your emotions.     Patient Self Care Activities:  Self administers medications as prescribed Attends all scheduled provider appointments  Patient Coping Strengths:  Family Friends  Patient Self Care Deficits:  Mobility issues Depression issues Anxiety issues  Patient Goals:  - spend time or talk with others at least 2 to 3 times per week - practice relaxation or meditation daily - keep a calendar with appointment dates  Follow Up Plan: [name] to call client on 02/27/21 at 3:00 PM to assess client needs     [name].[name] MSW, [name] Licensed Clinical Social Worker [facility] Care Management [phone]

## 2021-01-13 NOTE — Chronic Care Management (AMB) (Signed)
Chronic Care Management    Clinical Social Work Note  01/13/2021 Name: [name] MRN: [id number] DOB: [birth year]  PACE LAMADRID is a 65 y.o. year old male who is a primary care patient of [name], [name], MD. The CCM team was consulted to assist the patient with chronic disease management and/or care coordination needs related to: [name] .   Engaged with patient by telephone for follow up visit in response to provider referral for social work chronic care management and care coordination services.   Consent to Services:  The patient was given information about Chronic Care Management services, agreed to services, and gave verbal consent prior to initiation of services.  Please see initial visit note for detailed documentation.   Patient agreed to services and consent obtained.   Assessment: Review of patient past medical history, allergies, medications, and health status, including review of relevant consultants reports was performed today as part of a comprehensive evaluation and provision of chronic care management and care coordination services.     SDOH (Social Determinants of Health) assessments and interventions performed:  SDOH Interventions    Flowsheet Row Most Recent Value  SDOH Interventions   Physical Activity Interventions Other (Comments)  [walking challenges,  risk of fall]  Depression Interventions/Treatment  Currently on Treatment        Advanced Directives Status: See Vynca application for related entries.  CCM Care Plan  Allergies  Allergen Reactions   Chantix [Varenicline Tartrate] Nausea And Vomiting    Outpatient Encounter Medications as of 01/13/2021  Medication Sig   acetaminophen (TYLENOL) 325 MG tablet Take 2 tablets (650 mg total) by mouth every 6 (six) hours as needed for mild pain or headache.   albuterol (PROAIR HFA) 108 (90 Base) MCG/ACT inhaler 1-2 puffs every 6 hours as needed wheezing or shortness of breath.   aspirin EC 81  MG tablet Take 1 tablet (81 mg total) by mouth daily.   atorvastatin (LIPITOR) 40 MG tablet TAKE 1 TABLET ONCE DAILY AT 6PM   buPROPion (WELLBUTRIN SR) 150 MG 12 hr tablet Take 1 tablet (150 mg total) by mouth daily with breakfast.   celecoxib (CELEBREX) 200 MG capsule Take 1 capsule (200 mg total) by mouth daily as needed.   cetaphil (CETAPHIL) lotion Apply 1 application topically 2 (two) times daily. For rash. Apply after washing with [facility] shower gel and rinsing with clear warm water.   cyclobenzaprine (FLEXERIL) 5 MG tablet Take 1 tablet (5 mg total) by mouth 3 (three) times daily as needed for muscle spasms.   diclofenac (VOLTAREN) 75 MG EC tablet Take 1 tablet (75 mg total) by mouth 2 (two) times daily.   DULoxetine (CYMBALTA) 60 MG capsule Take 1 capsule (60 mg total) by mouth daily. WITH A FULL STOMACH AT SUPPER TIME   fexofenadine (ALLEGRA) 180 MG tablet Take 1 tablet (180 mg total) by mouth daily. For allergy symptoms   fluocinonide-emollient (LIDEX-E) 0.05 % cream Apply 1 application topically 2 (two) times daily. To affected areas   fluticasone (FLONASE) 50 MCG/ACT nasal spray Place 2 sprays into both nostrils daily as needed for allergies or rhinitis.   fluticasone furoate-vilanterol (BREO ELLIPTA) 100-25 MCG/INH AEPB Inhale 1 puff into the lungs daily.   Ipratropium-Albuterol (COMBIVENT RESPIMAT) 20-100 MCG/ACT AERS respimat Inhale 1 puff into the lungs every 6 (six) hours   isosorbide mononitrate (IMDUR) 60 MG 24 hr tablet Take 1 tablet (60 mg total) by mouth daily.   metoprolol tartrate (LOPRESSOR) 25 MG  tablet TAKE (1) TABLET TWICE A DAY.   mirtazapine (REMERON) 15 MG tablet Take 1 tablet (15 mg total) by mouth at bedtime. For sleep   nitroGLYCERIN (NITROSTAT) 0.4 MG SL tablet Place 1 tablet (0.4 mg total) under the tongue every 5 (five) minutes x 3 doses as needed for chest pain.   pantoprazole (PROTONIX) 40 MG tablet TAKE (1) TABLET TWICE A DAY.   tamsulosin (FLOMAX) 0.4  MG CAPS capsule Take 2 capsules (0.8 mg total) by mouth daily after supper.   Facility-Administered Encounter Medications as of 01/13/2021  Medication   cyanocobalamin ((VITAMIN B-12)) injection 1,000 mcg    Patient Active Problem List   Diagnosis Date Noted   Back pain without sciatica 10/31/2020   Degenerative disc disease, lumbar 01/16/2019   Depression    Hypokalemia 06/27/2018   Seizure disorder ([place]) 02/07/2018   Apraxia of speech 10/13/2017   Left-sided weakness 10/13/2017   Coronary artery disease of native artery of native heart with stable angina pectoris ([place])    Illiteracy 10/23/2016   Carpal tunnel syndrome of right wrist 06/15/2016   GAD (generalized anxiety disorder) 05/01/2016   History of MI (myocardial infarction) 02/26/2015   Dyslipidemia, goal LDL below 70 10/08/2009   Infantile cerebral palsy ([place]) 10/08/2009   Essential hypertension 10/08/2009    Conditions to be addressed/monitored: monitor client management of depression issues  Care Plan : [name]  Updates made by [name], [name] since 01/13/2021 12:00 AM     Problem: Emotional Distress      Goal: Emotional Health Supported;Manage ADLs; Manage depression issues   Start Date: 01/10/2021  Expected End Date: 04/08/2021  This Visit's Progress: On track  Recent Progress: On track  Priority: Medium  Note:   Current Barriers:  Chronic Mental Health needs related to depression management and anxiety management Mobility issues Suicidal Ideation/Homicidal Ideation: No  Clinical Social Work Goal(s):  patient will work with SW monthly by telephone or in person to reduce or manage symptoms related to depression and anxiety issues Patient will call RNCM or [name] as needed in next 30 days for CCM support  Interventions: 1:1 collaboration with [name], MD regarding development and update of comprehensive plan of care as evidenced by provider attestation and co-signature Talked with  client about his appointment at [facility] on 01/13/21 for B12 injection Talked with client about food provision of client Talked with client about sleeping issues of client Talked with client about pain issues of client Talked with client about mobility of client. Client said when he goes shopping he uses a motorized cart to ride when he is shopping Talked with client about his decreased energy and occasional fatigue.   Talked with client about appetite of client Talked with client about home health support with CNA, Brandy. Talked with client about his appointment on 01/28/21 at [facility] with Dr. Livia Snellen Provided counseling support for client  Patient Self Care Activities:  Self administers medications as prescribed Attends all scheduled provider appointments Performs ADL's independently  Patient Coping Strengths:  Family  Patient Self Care Deficits:  Mobility issues Needs some help occasionally with in home care needs  Patient Goals:  - spend time or talk with others at least 2 to 3 times per week - practice relaxation or meditation daily - keep a calendar with appointment dates  Follow Up Plan: [name] to call client on 02/27/21 at 3:00 PM to assess client needs      Makoa Satz.Ingra Rother MSW, [name] Licensed Clinical  Social Worker [facility] Care Management [phone]

## 2021-01-14 ENCOUNTER — Other Ambulatory Visit: Payer: Self-pay | Admitting: Family Medicine

## 2021-01-14 DIAGNOSIS — G4701 Insomnia due to medical condition: Secondary | ICD-10-CM

## 2021-01-14 DIAGNOSIS — F3341 Major depressive disorder, recurrent, in partial remission: Secondary | ICD-10-CM

## 2021-01-17 DIAGNOSIS — I252 Old myocardial infarction: Secondary | ICD-10-CM | POA: Diagnosis not present

## 2021-01-17 DIAGNOSIS — I209 Angina pectoris, unspecified: Secondary | ICD-10-CM | POA: Diagnosis not present

## 2021-01-17 DIAGNOSIS — E785 Hyperlipidemia, unspecified: Secondary | ICD-10-CM | POA: Diagnosis not present

## 2021-01-17 DIAGNOSIS — I25118 Atherosclerotic heart disease of native coronary artery with other forms of angina pectoris: Secondary | ICD-10-CM

## 2021-01-17 DIAGNOSIS — F3341 Major depressive disorder, recurrent, in partial remission: Secondary | ICD-10-CM

## 2021-01-17 DIAGNOSIS — I10 Essential (primary) hypertension: Secondary | ICD-10-CM | POA: Diagnosis not present

## 2021-01-17 DIAGNOSIS — J441 Chronic obstructive pulmonary disease with (acute) exacerbation: Secondary | ICD-10-CM | POA: Diagnosis not present

## 2021-01-17 DIAGNOSIS — F32A Depression, unspecified: Secondary | ICD-10-CM

## 2021-01-27 ENCOUNTER — Ambulatory Visit (INDEPENDENT_AMBULATORY_CARE_PROVIDER_SITE_OTHER): Payer: Medicare Other | Admitting: Licensed Clinical Social Worker

## 2021-01-27 DIAGNOSIS — G809 Cerebral palsy, unspecified: Secondary | ICD-10-CM

## 2021-01-27 DIAGNOSIS — J441 Chronic obstructive pulmonary disease with (acute) exacerbation: Secondary | ICD-10-CM

## 2021-01-27 DIAGNOSIS — F32A Depression, unspecified: Secondary | ICD-10-CM

## 2021-01-27 DIAGNOSIS — M51369 Other intervertebral disc degeneration, lumbar region without mention of lumbar back pain or lower extremity pain: Secondary | ICD-10-CM

## 2021-01-27 DIAGNOSIS — R482 Apraxia: Secondary | ICD-10-CM

## 2021-01-27 DIAGNOSIS — I252 Old myocardial infarction: Secondary | ICD-10-CM

## 2021-01-27 DIAGNOSIS — I10 Essential (primary) hypertension: Secondary | ICD-10-CM

## 2021-01-27 DIAGNOSIS — E538 Deficiency of other specified B group vitamins: Secondary | ICD-10-CM

## 2021-01-27 DIAGNOSIS — F411 Generalized anxiety disorder: Secondary | ICD-10-CM

## 2021-01-27 DIAGNOSIS — Z550 Illiteracy and low-level literacy: Secondary | ICD-10-CM

## 2021-01-27 DIAGNOSIS — E785 Hyperlipidemia, unspecified: Secondary | ICD-10-CM

## 2021-01-27 DIAGNOSIS — I25118 Atherosclerotic heart disease of native coronary artery with other forms of angina pectoris: Secondary | ICD-10-CM

## 2021-01-27 DIAGNOSIS — M5136 Other intervertebral disc degeneration, lumbar region: Secondary | ICD-10-CM

## 2021-01-27 NOTE — Chronic Care Management (AMB) (Signed)
Chronic Care Management    Clinical Social Work Note  01/27/2021 Name: [name] MRN: [id number] DOB: 19-Jan-1956  [name] is a 65 y.o. year old male who is a primary care patient of [name], [name], MD. The CCM team was consulted to assist the patient with chronic disease management and/or care coordination needs related to: [name] .   Engaged with patient by telephone for follow up visit in response to provider referral for social work chronic care management and care coordination services.   Consent to Services:  The patient was given information about Chronic Care Management services, agreed to services, and gave verbal consent prior to initiation of services.  Please see initial visit note for detailed documentation.   Patient agreed to services and consent obtained.   Assessment: Review of patient past medical history, allergies, medications, and health status, including review of relevant consultants reports was performed today as part of a comprehensive evaluation and provision of chronic care management and care coordination services.     SDOH (Social Determinants of Health) assessments and interventions performed:  SDOH Interventions    Flowsheet Row Most Recent Value  SDOH Interventions   Physical Activity Interventions Other (Comments)  [client has walking challenges, client uses a cane to help him walk]  Depression Interventions/Treatment  Currently on Treatment        Advanced Directives Status: See Vynca application for related entries.  CCM Care Plan  Allergies  Allergen Reactions   Chantix [Varenicline Tartrate] Nausea And Vomiting    Outpatient Encounter Medications as of 01/27/2021  Medication Sig   acetaminophen (TYLENOL) 325 MG tablet Take 2 tablets (650 mg total) by mouth every 6 (six) hours as needed for mild pain or headache.   albuterol (PROAIR HFA) 108 (90 Base) MCG/ACT inhaler 1-2 puffs every 6 hours as needed wheezing or  shortness of breath.   aspirin EC 81 MG tablet Take 1 tablet (81 mg total) by mouth daily.   atorvastatin (LIPITOR) 40 MG tablet TAKE 1 TABLET ONCE DAILY AT 6PM   buPROPion (WELLBUTRIN SR) 150 MG 12 hr tablet Take 1 tablet (150 mg total) by mouth daily with breakfast.   celecoxib (CELEBREX) 200 MG capsule Take 1 capsule (200 mg total) by mouth daily as needed.   cetaphil (CETAPHIL) lotion Apply 1 application topically 2 (two) times daily. For rash. Apply after washing with [facility] shower gel and rinsing with clear warm water.   cyclobenzaprine (FLEXERIL) 5 MG tablet Take 1 tablet (5 mg total) by mouth 3 (three) times daily as needed for muscle spasms.   diclofenac (VOLTAREN) 75 MG EC tablet Take 1 tablet (75 mg total) by mouth 2 (two) times daily.   DULoxetine (CYMBALTA) 60 MG capsule Take 1 capsule (60 mg total) by mouth daily. WITH A FULL STOMACH AT SUPPER TIME   fexofenadine (ALLEGRA) 180 MG tablet Take 1 tablet (180 mg total) by mouth daily. For allergy symptoms   fluocinonide-emollient (LIDEX-E) 0.05 % cream Apply 1 application topically 2 (two) times daily. To affected areas   fluticasone (FLONASE) 50 MCG/ACT nasal spray Place 2 sprays into both nostrils daily as needed for allergies or rhinitis.   fluticasone furoate-vilanterol (BREO ELLIPTA) 100-25 MCG/INH AEPB Inhale 1 puff into the lungs daily.   Ipratropium-Albuterol (COMBIVENT RESPIMAT) 20-100 MCG/ACT AERS respimat Inhale 1 puff into the lungs every 6 (six) hours   isosorbide mononitrate (IMDUR) 60 MG 24 hr tablet Take 1 tablet (60 mg total) by mouth daily.  metoprolol tartrate (LOPRESSOR) 25 MG tablet TAKE (1) TABLET TWICE A DAY.   mirtazapine (REMERON) 15 MG tablet TAKE 1 TABLET AT BEDTIME AS NEEDED FOR SLEEP   nitroGLYCERIN (NITROSTAT) 0.4 MG SL tablet Place 1 tablet (0.4 mg total) under the tongue every 5 (five) minutes x 3 doses as needed for chest pain.   pantoprazole (PROTONIX) 40 MG tablet TAKE (1) TABLET TWICE A DAY.    tamsulosin (FLOMAX) 0.4 MG CAPS capsule Take 2 capsules (0.8 mg total) by mouth daily after supper.   Facility-Administered Encounter Medications as of 01/27/2021  Medication   cyanocobalamin ((VITAMIN B-12)) injection 1,000 mcg    Patient Active Problem List   Diagnosis Date Noted   Back pain without sciatica 10/31/2020   Degenerative disc disease, lumbar 01/16/2019   Depression    Hypokalemia 06/27/2018   Seizure disorder ([place]) 02/07/2018   Apraxia of speech 10/13/2017   Left-sided weakness 10/13/2017   Coronary artery disease of native artery of native heart with stable angina pectoris ([place])    Illiteracy 10/23/2016   Carpal tunnel syndrome of right wrist 06/15/2016   GAD (generalized anxiety disorder) 05/01/2016   History of MI (myocardial infarction) 02/26/2015   Dyslipidemia, goal LDL below 70 10/08/2009   Infantile cerebral palsy ([place]) 10/08/2009   Essential hypertension 10/08/2009    Conditions to be addressed/monitored: monitor client management of depression issues and of anxiety issues  Care Plan : [name]  Updates made by [name], [name] since 01/27/2021 12:00 AM     Problem: Emotional Distress      Goal: Emotional Health Supported;Manage ADLs; Manage depression issues   Start Date: 01/10/2021  Expected End Date: 04/10/2021  This Visit's Progress: On track  Recent Progress: On track  Priority: Medium  Note:   Current Barriers:  Chronic Mental Health needs related to depression management and anxiety management Mobility issues Pain issues Illiteracy Suicidal Ideation/Homicidal Ideation: No  Clinical Social Work Goal(s):  patient will work with SW monthly by telephone or in person to reduce or manage symptoms related to depression and anxiety issues Patient will call RNCM or [name] as needed in next 30 days for CCM support Patient will communicate with [name] in next 30 days to discuss mobility issues of client and to discuss pain issues of  client  Interventions: 1:1 collaboration with [name], MD regarding development and update of comprehensive plan of care as evidenced by provider attestation and co-signature Discussed with [place] that he has appointment tomorrow at 9:40 AM with Dr. Livia Snellen. Reminded [place] that [name] Lucianne Lei will pick him up at his home at 9:10 AM tomorrow morning to transport him to and from his appointment tomorrow at [facility] with Dr. Livia Snellen Reviewed with client his recent fall at his home and swelling in right leg.  Encouraged client to talk with Dr. Livia Snellen tomorrow at his appointment with Dr. Livia Snellen about knee issues of client Encouraged client to talk with Dr. Livia Snellen at his appointment tomorrow with Dr. Livia Snellen about client dependence on care from home health aide weekly. Client needs help with bathing, dressing (has troubling lifting one arm), needs help with cooking, obtaining groceries, needs help with home chores, cannot shop on his own (uses motorized cart when he shops), needs help with paying his bills,needs help with maintaining his home. Provided counseling support from client.  Discussed with client his vision needs. He said he is having more trouble with vision and may need a vision exam.   Collaborated with T[place]  Helene Kelp , LPN about PCS application for client  Patient Self Care Activities:  Self administers medications as prescribed Attends all scheduled provider appointments Performs ADL's independently  Patient Coping Strengths:  Family  Patient Self Care Deficits:  Mobility issues Needs some help occasionally with in home care needs  Patient Goals:  - spend time or talk with others at least 2 to 3 times per week - practice relaxation or meditation daily - keep a calendar with appointment dates  Follow Up Plan: [name] to call client on 02/27/21 at 3:00 PM to assess client needs      Dacota Ruben.Lanaysia Fritchman MSW, [name] Licensed Clinical Social Worker [facility] Care Management [phone]

## 2021-01-27 NOTE — Patient Instructions (Addendum)
Visit Information  PATIENT GOALS: Manage emotions. Manage depression issues. Complete ADLs daily with assistance as needed  Timeframe:  Short-Term Goal Priority:  Medium Progress: On Track Start Date:            12/31/20                Expected End Date:          03/31/21            Follow Up Date 02/27/21 at 3:00 PM   Manage emotions. Manage depression issues . Complete ADLs daily with assistance as needed   Why is this important?   When you are stressed, down or upset, your body reacts too.  For example, your blood pressure may get higher; you may have a headache or stomachache.  When your emotions get the best of you, your body's ability to fight off cold and flu gets weak.  These steps will help you manage your emotions.     Patient Self Care Activities:  Self administers medications as prescribed Attends all scheduled provider appointments  Patient Coping Strengths:  Family Friends  Patient Self Care Deficits:  Mobility issues Depression issues Anxiety issues  Patient Goals:  - spend time or talk with others at least 2 to 3 times per week - practice relaxation or meditation daily - keep a calendar with appointment dates  Follow Up Plan: [name] to call client on 02/27/21 at 3:00 PM to assess client needs   Cornel Werber.Marselino Slayton MSW, [name] Licensed Clinical Social Worker [facility] Care Management [phone]

## 2021-01-28 ENCOUNTER — Ambulatory Visit: Payer: Medicare Other | Admitting: Family Medicine

## 2021-01-29 ENCOUNTER — Other Ambulatory Visit: Payer: Self-pay | Admitting: Family Medicine

## 2021-01-29 DIAGNOSIS — I25118 Atherosclerotic heart disease of native coronary artery with other forms of angina pectoris: Secondary | ICD-10-CM

## 2021-01-29 DIAGNOSIS — I209 Angina pectoris, unspecified: Secondary | ICD-10-CM

## 2021-01-29 DIAGNOSIS — I10 Essential (primary) hypertension: Secondary | ICD-10-CM

## 2021-01-30 ENCOUNTER — Ambulatory Visit: Payer: Medicare Other | Admitting: Family Medicine

## 2021-01-31 ENCOUNTER — Encounter: Payer: Self-pay | Admitting: Family Medicine

## 2021-01-31 ENCOUNTER — Other Ambulatory Visit: Payer: Self-pay

## 2021-01-31 ENCOUNTER — Ambulatory Visit (INDEPENDENT_AMBULATORY_CARE_PROVIDER_SITE_OTHER): Payer: Medicare Other | Admitting: Family Medicine

## 2021-01-31 VITALS — BP 130/76 | HR 81 | Temp 98.0°F | Ht 73.0 in | Wt 254.4 lb

## 2021-01-31 DIAGNOSIS — I10 Essential (primary) hypertension: Secondary | ICD-10-CM

## 2021-01-31 DIAGNOSIS — R482 Apraxia: Secondary | ICD-10-CM | POA: Diagnosis not present

## 2021-01-31 DIAGNOSIS — F411 Generalized anxiety disorder: Secondary | ICD-10-CM

## 2021-01-31 DIAGNOSIS — Z550 Illiteracy and low-level literacy: Secondary | ICD-10-CM

## 2021-01-31 DIAGNOSIS — R5383 Other fatigue: Secondary | ICD-10-CM | POA: Diagnosis not present

## 2021-01-31 DIAGNOSIS — G809 Cerebral palsy, unspecified: Secondary | ICD-10-CM

## 2021-01-31 NOTE — Progress Notes (Signed)
Subjective:  Patient ID: [name], male    DOB: Mar 07, 1956  Age: 65 y.o. MRN: [id number]  CC: Medical Management of Chronic Issues   HPI SKIPPER DACOSTA presents for no energy. Not doing anything except sleeping. Denies dyspnea, just tired. No chest pains.  "All I want to do is sit in my chair." Pt. Concerned meds may be the cause. Sleeping okay at night.   PT. Also has CP. Weak on left side, illiterate and has speech slurring/apraxia. Requesting extra help at home.   presents for  follow-up of hypertension. Patient has no history of headache chest pain or shortness of breath or recent cough. Patient also denies symptoms of TIA such as focal numbness or weakness. Patient denies side effects from medication. States taking it regularly.   Depression screen [facility] 2/9 01/31/2021 01/27/2021 01/27/2021  Decreased Interest 0 1 1  Down, Depressed, Hopeless 0 1 1  PHQ - 2 Score 0 2 2  Altered sleeping - 0 0  Tired, decreased energy - 1 1  Change in appetite - 0 0  Feeling bad or failure about yourself  - 1 1  Trouble concentrating - 1 1  Moving slowly or fidgety/restless - 2 2  Suicidal thoughts - 0 0  PHQ-9 Score - 7 7  Difficult doing work/chores - Somewhat difficult Somewhat difficult  Some recent data might be hidden    History Jamari has a past medical history of Cerebral palsy ([place]), Coronary artery disease, Depression, GERD (gastroesophageal reflux disease), Hypertension, Scoliosis, and Seizures ([place]).   He has a past surgical history that includes Coronary angioplasty with stent; Carpal tunnel release (Right, 08/13/2016); and LEFT HEART CATH AND CORONARY ANGIOGRAPHY (N/A, 07/10/2017).   His family history includes Alcohol abuse in his brother; CAD in his brother and sister; CAD (age of onset: 26) in his father; Diabetes in his mother; Heart disease in his father; Heart failure in his sister.He reports that he quit smoking about 2 years ago. His smoking use included cigarettes.  He has a 20.50 pack-year smoking history. He has never used smokeless tobacco. He reports current alcohol use. He reports that he does not use drugs.    ROS Review of Systems  Constitutional:  Negative for fever.  Respiratory:  Negative for shortness of breath.   Cardiovascular:  Negative for chest pain.  Musculoskeletal:  Negative for arthralgias.  Skin:  Negative for rash.  Neurological:  Positive for speech difficulty and weakness (Left side, upper and lower extremity).  Psychiatric/Behavioral:  Negative for sleep disturbance.    Objective:  BP 130/76   Pulse 81   Temp 98 F (36.7 C)   Ht '6\' 1"'  (1.854 m)   Wt 254 lb 6.4 oz (115.4 kg)   SpO2 94%   BMI 33.56 kg/m   BP Readings from Last 3 Encounters:  01/31/21 130/76  10/31/20 114/64  07/17/20 118/78    Wt Readings from Last 3 Encounters:  01/31/21 254 lb 6.4 oz (115.4 kg)  10/31/20 256 lb (116.1 kg)  09/11/20 258 lb (117 kg)     Physical Exam Vitals reviewed.  Constitutional:      Appearance: He is well-developed.  HENT:     Head: Normocephalic and atraumatic.     Right Ear: External ear normal.     Left Ear: External ear normal.     Mouth/Throat:     Pharynx: No oropharyngeal exudate or posterior oropharyngeal erythema.  Eyes:     Pupils: Pupils are equal,  round, and reactive to light.  Cardiovascular:     Rate and Rhythm: Normal rate and regular rhythm.     Heart sounds: No murmur heard. Pulmonary:     Effort: No respiratory distress.     Breath sounds: Normal breath sounds.  Musculoskeletal:        General: Deformity (left arm with decreased abduction and flexion at elbow and shoulder) present.     Cervical back: Normal range of motion and neck supple.     Right lower leg: No edema.     Left lower leg: No edema.  Skin:    General: Skin is warm and dry.  Neurological:     Mental Status: He is alert and oriented to person, place, and time.     Cranial Nerves: Cranial nerve deficit present.      Coordination: Coordination abnormal (weak on left with unsteady gait.).      Assessment & Plan:   Even was seen today for medical management of chronic issues.  Diagnoses and all orders for this visit:  Infantile cerebral palsy (HCC)  Fatigue, unspecified type -     CBC with Differential/Platelet -     CMP14+EGFR -     TSH + free T4  Essential hypertension  GAD (generalized anxiety disorder)  Apraxia of speech  Illiteracy      I have discontinued Christian Mate. Knoble's celecoxib and cyclobenzaprine. I am also having him maintain his acetaminophen, cetaphil, aspirin EC, albuterol, fexofenadine, fluocinonide-emollient, Breo Ellipta, Combivent Respimat, tamsulosin, nitroGLYCERIN, DULoxetine, buPROPion, atorvastatin, fluticasone, diclofenac, pantoprazole, mirtazapine, isosorbide mononitrate, and metoprolol tartrate. We will continue to administer cyanocobalamin.  Allergies as of 01/31/2021       Reactions   Chantix [varenicline Tartrate] Nausea And Vomiting        Medication List        Accurate as of January 31, 2021 10:45 AM. If you have any questions, ask your nurse or doctor.          STOP taking these medications    celecoxib 200 MG capsule Commonly known as: CELEBREX Stopped by: [name], MD   cyclobenzaprine 5 MG tablet Commonly known as: FLEXERIL Stopped by: [name], MD       TAKE these medications    acetaminophen 325 MG tablet Commonly known as: TYLENOL Take 2 tablets (650 mg total) by mouth every 6 (six) hours as needed for mild pain or headache.   albuterol 108 (90 Base) MCG/ACT inhaler Commonly known as: ProAir HFA 1-2 puffs every 6 hours as needed wheezing or shortness of breath.   aspirin EC 81 MG tablet Take 1 tablet (81 mg total) by mouth daily.   atorvastatin 40 MG tablet Commonly known as: LIPITOR TAKE 1 TABLET ONCE DAILY AT 6PM   Breo Ellipta 100-25 MCG/INH Aepb Generic drug: fluticasone  furoate-vilanterol Inhale 1 puff into the lungs daily.   buPROPion 150 MG 12 hr tablet Commonly known as: WELLBUTRIN SR Take 1 tablet (150 mg total) by mouth daily with breakfast.   cetaphil lotion Apply 1 application topically 2 (two) times daily. For rash. Apply after washing with [facility] shower gel and rinsing with clear warm water.   Combivent Respimat 20-100 MCG/ACT Aers respimat Generic drug: Ipratropium-Albuterol Inhale 1 puff into the lungs every 6 (six) hours   diclofenac 75 MG EC tablet Commonly known as: VOLTAREN Take 1 tablet (75 mg total) by mouth 2 (two) times daily.   DULoxetine 60 MG capsule Commonly known as: CYMBALTA Take  1 capsule (60 mg total) by mouth daily. WITH A FULL STOMACH AT SUPPER TIME   fexofenadine 180 MG tablet Commonly known as: ALLEGRA Take 1 tablet (180 mg total) by mouth daily. For allergy symptoms   fluocinonide-emollient 0.05 % cream Commonly known as: LIDEX-E Apply 1 application topically 2 (two) times daily. To affected areas   fluticasone 50 MCG/ACT nasal spray Commonly known as: FLONASE Place 2 sprays into both nostrils daily as needed for allergies or rhinitis.   isosorbide mononitrate 60 MG 24 hr tablet Commonly known as: IMDUR Take 1 tablet (60 mg total) by mouth daily. (NEEDS TO BE SEEN BEFORE NEXT REFILL)   metoprolol tartrate 25 MG tablet Commonly known as: LOPRESSOR TAKE (1) TABLET TWICE A DAY.   mirtazapine 15 MG tablet Commonly known as: REMERON TAKE 1 TABLET AT BEDTIME AS NEEDED FOR SLEEP   nitroGLYCERIN 0.4 MG SL tablet Commonly known as: NITROSTAT Place 1 tablet (0.4 mg total) under the tongue every 5 (five) minutes x 3 doses as needed for chest pain.   pantoprazole 40 MG tablet Commonly known as: PROTONIX TAKE (1) TABLET TWICE A DAY.   tamsulosin 0.4 MG Caps capsule Commonly known as: FLOMAX Take 2 capsules (0.8 mg total) by mouth daily after supper.         Follow-up: Return in about 3 months  (around 05/03/2021), or if symptoms worsen or fail to improve.  [name], M.D.

## 2021-02-01 LAB — CBC WITH DIFFERENTIAL/PLATELET
Basophils Absolute: 0.1 10*3/uL (ref 0.0–0.2)
Basos: 1 %
EOS (ABSOLUTE): 0.9 10*3/uL — ABNORMAL HIGH (ref 0.0–0.4)
Eos: 8 %
Hematocrit: 42.5 % (ref 37.5–51.0)
Hemoglobin: 15.2 g/dL (ref 13.0–17.7)
Immature Grans (Abs): 0 10*3/uL (ref 0.0–0.1)
Immature Granulocytes: 0 %
Lymphocytes Absolute: 3.6 10*3/uL — ABNORMAL HIGH (ref 0.7–3.1)
Lymphs: 32 %
MCH: 35.6 pg — ABNORMAL HIGH (ref 26.6–33.0)
MCHC: 35.8 g/dL — ABNORMAL HIGH (ref 31.5–35.7)
MCV: 100 fL — ABNORMAL HIGH (ref 79–97)
Monocytes Absolute: 0.7 10*3/uL (ref 0.1–0.9)
Monocytes: 6 %
Neutrophils Absolute: 6.2 10*3/uL (ref 1.4–7.0)
Neutrophils: 53 %
Platelets: 216 10*3/uL (ref 150–450)
RBC: 4.27 x10E6/uL (ref 4.14–5.80)
RDW: 12.7 % (ref 11.6–15.4)
WBC: 11.5 10*3/uL — ABNORMAL HIGH (ref 3.4–10.8)

## 2021-02-01 LAB — CMP14+EGFR
ALT: 14 IU/L (ref 0–44)
AST: 17 IU/L (ref 0–40)
Albumin/Globulin Ratio: 1.4 (ref 1.2–2.2)
Albumin: 4.1 g/dL (ref 3.8–4.8)
Alkaline Phosphatase: 83 IU/L (ref 44–121)
BUN/Creatinine Ratio: 19 (ref 10–24)
BUN: 15 mg/dL (ref 8–27)
Bilirubin Total: 0.5 mg/dL (ref 0.0–1.2)
CO2: 26 mmol/L (ref 20–29)
Calcium: 9.6 mg/dL (ref 8.6–10.2)
Chloride: 102 mmol/L (ref 96–106)
Creatinine, Ser: 0.78 mg/dL (ref 0.76–1.27)
Globulin, Total: 3 g/dL (ref 1.5–4.5)
Glucose: 94 mg/dL (ref 70–99)
Potassium: 4.2 mmol/L (ref 3.5–5.2)
Sodium: 144 mmol/L (ref 134–144)
Total Protein: 7.1 g/dL (ref 6.0–8.5)
eGFR: 100 mL/min/{1.73_m2} (ref >59)

## 2021-02-01 LAB — TSH+FREE T4
Free T4: 0.88 ng/dL (ref 0.82–1.77)
TSH: 0.809 u[IU]/mL (ref 0.450–4.500)

## 2021-02-03 NOTE — Progress Notes (Signed)
Hello Jeramih,  Your lab result is normal and/or stable.Some minor variations that are not significant are commonly marked abnormal, but do not represent any medical problem for you.  Best regards, [name], M.D.

## 2021-02-04 ENCOUNTER — Ambulatory Visit: Payer: Medicare Other | Admitting: *Deleted

## 2021-02-04 DIAGNOSIS — G809 Cerebral palsy, unspecified: Secondary | ICD-10-CM

## 2021-02-04 DIAGNOSIS — E538 Deficiency of other specified B group vitamins: Secondary | ICD-10-CM

## 2021-02-04 DIAGNOSIS — Z748 Other problems related to care provider dependency: Secondary | ICD-10-CM

## 2021-02-04 DIAGNOSIS — Z9181 History of falling: Secondary | ICD-10-CM

## 2021-02-04 DIAGNOSIS — I10 Essential (primary) hypertension: Secondary | ICD-10-CM

## 2021-02-04 DIAGNOSIS — I25118 Atherosclerotic heart disease of native coronary artery with other forms of angina pectoris: Secondary | ICD-10-CM

## 2021-02-09 NOTE — Patient Instructions (Addendum)
Visit Information    Care Plan : [place]  Updates made by [name], RN since 02/09/2021 12:00 AM       Problem: Chronic Disease Management Needs    Priority: Medium       Long-Range Goal: Patient will Work With Consulting civil engineer Regarding Care Management and Care Coordination Associated with Chronic Medical Conditions    This Visit's Progress: On track  Recent Progress: On track  Priority: High  Note:   Current Barriers:  Care Coordination needs related to Transportation, Level of care concerns, ADL IADL limitations, Family and relationship dysfunction, Social Isolation, Literacy concerns, Inability to perform ADL's independently, and Inability to perform IADL's independently  Chronic Disease Management support and education needs related to CAD, HTN, Pulmonary Disease, and Cerebral Palsy Lacks caregiver support.  Unable to independently manage his medical conditions Transportation needs Limited mobility   RNCM Clinical Goal(s):  Patient will take all medications exactly as prescribed and will call provider for medication related questions continue to work with RN Care Manager to address care management and care coordination needs related to CAD, HTN, Pulmonary Disease, and cerebral palsy  work with Education officer, museum to address Limited social support, Transportation, Level of care concerns, ADL IADL limitations, Social Isolation, Literacy concerns, Cognitive Deficits, and Memory Deficits related to the management of cerebral palsy  through collaboration with Consulting civil engineer, provider, and care team.    Interventions: 1:1 collaboration with primary care provider regarding development and update of comprehensive plan of care as evidenced by provider attestation and co-signature Inter-disciplinary care team collaboration (see longitudinal plan of care) Evaluation of current treatment plan related to  self management and patient's adherence to plan as established by  provider Provided with RNCM contact number and encouraged to reach out as needed     SDOH Barriers (Status: Goal on track: YES.)  Patient interviewed and SDOH assessment performed       Collaborated with [facility] (community agency) re: transportation assistance to upcoming B12 injection appointment at [facility]   Hypertension: (Status: Goal on track: YES.) Last practice recorded BP readings:     BP Readings from Last 3 Encounters:  01/31/21 130/76  10/31/20 114/64  07/17/20 118/78    Evaluation of current treatment plan related to hypertension and patient's adherence to plan as established by provider. Reviewed medications Medications are prepackaged for the month and delivered by [facility] Patient is compliant with medications No problems with affordability. Has Medicaid secondary which covers medication cost. Advised patient to check and record blood pressure daily and as needed Advised to call PCP with any blood pressure readings that are outside of recommended range Bring log to PCP and cardiology appointments Recommended DASH diet Reviewed upcoming appointments Discussed transportation through [name]     CAD  (Status: Goal on track: YES.) Assessed understanding of CAD diagnosis Medications reviewed including medications utilized in CAD treatment plan Evaluation of current treatment plan related to CAD and patient's adherence to plan as established by provider. Has not had to take nitro "in a while" Reviewed s/s of a heart attack No chest pain or pressure No shortness of breath Encouraged increasing physical activity as tolerated Patient feels weak after walking short distances. He does try to walk some in his home. Encouraged to slowly increase the distance he walks in his home  Fall prevention discussed Discussed diet Has mostly frozen meals for convenience Reinforced need to limit fried, fatty, and sugary foods and to eat vegetables, fruits, and  lean  proteins Reinforced to call 911 if he has any s/s of a heart attack     Cerebral Palsy:  (Status: Goal on track: YES.) Family/social support discussed Patient has two sisters but they are not very involved in his care. He may talk with them once a month by telephone.  Discussed Personal Care Services [facility]) Has an aid, Theadora Rama, that comes Mon-Fri for a few hours each day. Totals 80 hrs a month. [place] [phone]) Theadora Rama assists with ADLs, bill payment, grocery shopping, and transport to appointments He feels very comfortable with her and says that if it wasn't for her, he wouldn't be able to care for himself Previously collaborated with [name], PCP office, [employer], and [place] to increase his aid hours Fall assessment complete Has had a couple of falls recently. Per patient, his legs just "give way"  Verbal education on fall prevention strategies given Discussed mobility and ambulation Does feel weak when walking. Can't walk very far.  Using a cane Encouraged to do physical therapy exercises that he was given months ago Encouraged to slowly increase the distance he walks inside of his home to build up stamina and strength Encouraged to walk/exercise when he's not at home alone Discussed how sedentary he is and if he doesn't move around more, he'll just continue to decondition and physically decline Provided lots of encouragement Strongly advised to be completely open/honest with PCP about his limitations and any new or worsening problems. In order to qualify for more PCS hours, there has to be a change in status.  Therapeutic listening utilized regarding physical struggles, general health, and family/social dynamics     B12 Deficiency:  (Status: Goal on track: YES.) Reviewed recent office notes and lab results with patient No recent B12 levels Provider did recommend that  he continue monthly B12 injections Collaborated with front  office staff to schedule his next B12 injection ]   Patient Goals/Self-Care Activities: Patient will self administer medications as prescribed Patient will call pharmacy for medication refills Patient will continue to perform ADL's independently Patient will call provider office for new concerns or questions      Patient verbalizes understanding of instructions provided today and agrees to view in [place].    Plan:Telephone follow up appointment with care management team member scheduled for:  02/28/21 with RNCM The patient has been provided with contact information for the care management team and has been advised to call with any health related questions or concerns.    [name], BSN, RN-BC Embedded Chronic Care Manager [facility] [place] Management Direct Dial: [phone]

## 2021-02-09 NOTE — Chronic Care Management (AMB) (Signed)
Chronic Care Management   CCM RN Visit Note  02/04/2021 Name: [name] MRN: [id number] DOB: October 29, 1955  Subjective: [name] is a 65 y.o. year old male who is a primary care patient of [name], James Gash, MD. The care management team was consulted for assistance with disease management and care coordination needs.    Engaged with patient by telephone for follow up visit in response to provider referral for case management and/or care coordination services.   Consent to Services:  The patient was given information about Chronic Care Management services, agreed to services, and gave verbal consent prior to initiation of services.  Please see initial visit note for detailed documentation.   Patient agreed to services and verbal consent obtained.   Assessment: Review of patient past medical history, allergies, medications, health status, including review of consultants reports, laboratory and other test data, was performed as part of comprehensive evaluation and provision of chronic care management services.   SDOH (Social Determinants of Health) assessments and interventions performed:    CCM Care Plan  Allergies  Allergen Reactions   Chantix [Varenicline Tartrate] Nausea And Vomiting    Outpatient Encounter Medications as of 02/04/2021  Medication Sig   acetaminophen (TYLENOL) 325 MG tablet Take 2 tablets (650 mg total) by mouth every 6 (six) hours as needed for mild pain or headache.   albuterol (PROAIR HFA) 108 (90 Base) MCG/ACT inhaler 1-2 puffs every 6 hours as needed wheezing or shortness of breath.   aspirin EC 81 MG tablet Take 1 tablet (81 mg total) by mouth daily.   atorvastatin (LIPITOR) 40 MG tablet TAKE 1 TABLET ONCE DAILY AT 6PM   buPROPion (WELLBUTRIN SR) 150 MG 12 hr tablet Take 1 tablet (150 mg total) by mouth daily with breakfast.   cetaphil (CETAPHIL) lotion Apply 1 application topically 2 (two) times daily. For rash. Apply after washing with [facility]  shower gel and rinsing with clear warm water.   diclofenac (VOLTAREN) 75 MG EC tablet Take 1 tablet (75 mg total) by mouth 2 (two) times daily.   DULoxetine (CYMBALTA) 60 MG capsule Take 1 capsule (60 mg total) by mouth daily. WITH A FULL STOMACH AT SUPPER TIME   fexofenadine (ALLEGRA) 180 MG tablet Take 1 tablet (180 mg total) by mouth daily. For allergy symptoms   fluocinonide-emollient (LIDEX-E) 0.05 % cream Apply 1 application topically 2 (two) times daily. To affected areas   fluticasone (FLONASE) 50 MCG/ACT nasal spray Place 2 sprays into both nostrils daily as needed for allergies or rhinitis.   fluticasone furoate-vilanterol (BREO ELLIPTA) 100-25 MCG/INH AEPB Inhale 1 puff into the lungs daily.   Ipratropium-Albuterol (COMBIVENT RESPIMAT) 20-100 MCG/ACT AERS respimat Inhale 1 puff into the lungs every 6 (six) hours   isosorbide mononitrate (IMDUR) 60 MG 24 hr tablet Take 1 tablet (60 mg total) by mouth daily. (NEEDS TO BE SEEN BEFORE NEXT REFILL)   metoprolol tartrate (LOPRESSOR) 25 MG tablet TAKE (1) TABLET TWICE A DAY.   mirtazapine (REMERON) 15 MG tablet TAKE 1 TABLET AT BEDTIME AS NEEDED FOR SLEEP   nitroGLYCERIN (NITROSTAT) 0.4 MG SL tablet Place 1 tablet (0.4 mg total) under the tongue every 5 (five) minutes x 3 doses as needed for chest pain.   pantoprazole (PROTONIX) 40 MG tablet TAKE (1) TABLET TWICE A DAY.   tamsulosin (FLOMAX) 0.4 MG CAPS capsule Take 2 capsules (0.8 mg total) by mouth daily after supper.   Facility-Administered Encounter Medications as of 02/04/2021  Medication  cyanocobalamin ((VITAMIN B-12)) injection 1,000 mcg    Patient Active Problem List   Diagnosis Date Noted   Back pain without sciatica 10/31/2020   Degenerative disc disease, lumbar 01/16/2019   Depression    Hypokalemia 06/27/2018   Seizure disorder ([place]) 02/07/2018   Apraxia of speech 10/13/2017   Left-sided weakness 10/13/2017   Coronary artery disease of native artery of native heart with  stable angina pectoris ([place])    Illiteracy 10/23/2016   Carpal tunnel syndrome of right wrist 06/15/2016   GAD (generalized anxiety disorder) 05/01/2016   History of MI (myocardial infarction) 02/26/2015   Dyslipidemia, goal LDL below 70 10/08/2009   Infantile cerebral palsy ([place]) 10/08/2009   Essential hypertension 10/08/2009    Conditions to be addressed/monitored:CAD, HTN, and infantile cerebral palsy  Care Plan : [facility] Care Plan  Updates made by [name], RN since 02/09/2021 12:00 AM     Problem: Chronic Disease Management Needs   Priority: Medium     Long-Range Goal: Patient will Work With Consulting civil engineer Regarding Care Management and Care Coordination Associated with Chronic Medical Conditions   This Visit's Progress: On track  Recent Progress: On track  Priority: High  Note:   Current Barriers:  Care Coordination needs related to Transportation, Level of care concerns, ADL IADL limitations, Family and relationship dysfunction, Social Isolation, Literacy concerns, Inability to perform ADL's independently, and Inability to perform IADL's independently  Chronic Disease Management support and education needs related to CAD, HTN, Pulmonary Disease, and Cerebral Palsy Lacks caregiver support.  Unable to independently manage his medical conditions Transportation needs Limited mobility  RNCM Clinical Goal(s):  Patient will take all medications exactly as prescribed and will call provider for medication related questions continue to work with RN Care Manager to address care management and care coordination needs related to CAD, HTN, Pulmonary Disease, and cerebral palsy  work with Education officer, museum to address Limited social support, Transportation, Level of care concerns, ADL IADL limitations, Social Isolation, Literacy concerns, Cognitive Deficits, and Memory Deficits related to the management of cerebral palsy  through collaboration with Consulting civil engineer, provider, and care team.    Interventions: 1:1 collaboration with primary care provider regarding development and update of comprehensive plan of care as evidenced by provider attestation and co-signature Inter-disciplinary care team collaboration (see longitudinal plan of care) Evaluation of current treatment plan related to  self management and patient's adherence to plan as established by provider Provided with RNCM contact number and encouraged to reach out as needed   SDOH Barriers (Status: Goal on track: YES.)  Patient interviewed and SDOH assessment performed       Collaborated with [facility] (community agency) re: transportation assistance to upcoming B12 injection appointment at [facility]   Hypertension: (Status: Goal on track: YES.) Last practice recorded BP readings:  BP Readings from Last 3 Encounters:  01/31/21 130/76  10/31/20 114/64  07/17/20 118/78   Evaluation of current treatment plan related to hypertension and patient's adherence to plan as established by provider. Reviewed medications Medications are prepackaged for the month and delivered by [facility] Patient is compliant with medications No problems with affordability. Has Medicaid secondary which covers medication cost. Advised patient to check and record blood pressure daily and as needed Advised to call PCP with any blood pressure readings that are outside of recommended range Bring log to PCP and cardiology appointments Recommended DASH diet Reviewed upcoming appointments Discussed transportation through [name]   CAD  (Status: Goal on track: YES.)  Assessed understanding of CAD diagnosis Medications reviewed including medications utilized in CAD treatment plan Evaluation of current treatment plan related to CAD and patient's adherence to plan as established by provider. Has not had to take nitro "in a while" Reviewed s/s of a heart attack No chest pain or pressure No shortness of breath Encouraged increasing physical activity as  tolerated Patient feels weak after walking short distances. He does try to walk some in his home. Encouraged to slowly increase the distance he walks in his home  Fall prevention discussed Discussed diet Has mostly frozen meals for convenience Reinforced need to limit fried, fatty, and sugary foods and to eat vegetables, fruits, and lean proteins Reinforced to call 911 if he has any s/s of a heart attack   Cerebral Palsy:  (Status: Goal on track: YES.) Family/social support discussed Patient has two sisters but they are not very involved in his care. He may talk with them once a month by telephone.  Discussed Personal Care Services [facility]) Has an aid, Theadora Rama, that comes Mon-Fri for a few hours each day. Totals 80 hrs a month. [place] [phone]) Theadora Rama assists with ADLs, bill payment, grocery shopping, and transport to appointments He feels very comfortable with her and says that if it wasn't for her, he wouldn't be able to care for himself Previously collaborated with [name], PCP office, [employer], and [place] to increase his aid hours Fall assessment complete Has had a couple of falls recently. Per patient, his legs just "give way"  Verbal education on fall prevention strategies given Discussed mobility and ambulation Does feel weak when walking. Can't walk very far.  Using a cane Encouraged to do physical therapy exercises that he was given months ago Encouraged to slowly increase the distance he walks inside of his home to build up stamina and strength Encouraged to walk/exercise when he's not at home alone Discussed how sedentary he is and if he doesn't move around more, he'll just continue to decondition and physically decline Provided lots of encouragement Strongly advised to be completely open/honest with PCP about his limitations and any new or worsening problems. In order to qualify for more PCS hours, there has to be a change  in status.  Therapeutic listening utilized regarding physical struggles, general health, and family/social dynamics   B12 Deficiency:  (Status: Goal on track: YES.) Reviewed recent office notes and lab results with patient No recent B12 levels Provider did recommend that  he continue monthly B12 injections Collaborated with front office staff to schedule his next B12 injection ]  Patient Goals/Self-Care Activities: Patient will self administer medications as prescribed Patient will call pharmacy for medication refills Patient will continue to perform ADL's independently Patient will call provider office for new concerns or questions     Plan:Telephone follow up appointment with care management team member scheduled for:  02/28/21 with RNCM The patient has been provided with contact information for the care management team and has been advised to call with any health related questions or concerns.   [name], BSN, RN-BC Embedded Chronic Care Manager [facility] [place] Management Direct Dial: [phone]

## 2021-02-11 ENCOUNTER — Ambulatory Visit: Payer: Medicare Other | Admitting: Licensed Clinical Social Worker

## 2021-02-11 DIAGNOSIS — G809 Cerebral palsy, unspecified: Secondary | ICD-10-CM

## 2021-02-11 DIAGNOSIS — F411 Generalized anxiety disorder: Secondary | ICD-10-CM

## 2021-02-11 DIAGNOSIS — F32A Depression, unspecified: Secondary | ICD-10-CM

## 2021-02-11 DIAGNOSIS — I209 Angina pectoris, unspecified: Secondary | ICD-10-CM

## 2021-02-11 DIAGNOSIS — J441 Chronic obstructive pulmonary disease with (acute) exacerbation: Secondary | ICD-10-CM

## 2021-02-11 DIAGNOSIS — M5136 Other intervertebral disc degeneration, lumbar region: Secondary | ICD-10-CM

## 2021-02-11 DIAGNOSIS — E538 Deficiency of other specified B group vitamins: Secondary | ICD-10-CM

## 2021-02-11 DIAGNOSIS — Z550 Illiteracy and low-level literacy: Secondary | ICD-10-CM

## 2021-02-11 DIAGNOSIS — I25118 Atherosclerotic heart disease of native coronary artery with other forms of angina pectoris: Secondary | ICD-10-CM

## 2021-02-11 DIAGNOSIS — I10 Essential (primary) hypertension: Secondary | ICD-10-CM

## 2021-02-11 DIAGNOSIS — E785 Hyperlipidemia, unspecified: Secondary | ICD-10-CM

## 2021-02-11 DIAGNOSIS — R482 Apraxia: Secondary | ICD-10-CM

## 2021-02-11 DIAGNOSIS — I252 Old myocardial infarction: Secondary | ICD-10-CM

## 2021-02-11 NOTE — Chronic Care Management (AMB) (Signed)
Chronic Care Management    Clinical Social Work Note  02/11/2021 Name: [name] MRN: [id number] DOB: [birth year]  [name] is a 65 y.o. year old male who is a primary care patient of [name], [name], MD. The CCM team was consulted to assist the patient with chronic disease management and/or care coordination needs related to: [name] .   Engaged with patient by telephone for follow up visit in response to provider referral for social work chronic care management and care coordination services.   Consent to Services:  The patient was given information about Chronic Care Management services, agreed to services, and gave verbal consent prior to initiation of services.  Please see initial visit note for detailed documentation.   Patient agreed to services and consent obtained.   Assessment: Review of patient past medical history, allergies, medications, and health status, including review of relevant consultants reports was performed today as part of a comprehensive evaluation and provision of chronic care management and care coordination services.     SDOH (Social Determinants of Health) assessments and interventions performed:  SDOH Interventions    Flowsheet Row Most Recent Value  SDOH Interventions   Physical Activity Interventions Other (Comments)  [has walking challenges,  at risk for fall]  Stress Interventions Provide Counseling  [client has stress related to managing his medical conditions]  Depression Interventions/Treatment  Currently on Treatment        Advanced Directives Status: See Vynca application for related entries.  CCM Care Plan  Allergies  Allergen Reactions   Chantix [Varenicline Tartrate] Nausea And Vomiting    Outpatient Encounter Medications as of 02/11/2021  Medication Sig   acetaminophen (TYLENOL) 325 MG tablet Take 2 tablets (650 mg total) by mouth every 6 (six) hours as needed for mild pain or headache.   albuterol (PROAIR HFA)  108 (90 Base) MCG/ACT inhaler 1-2 puffs every 6 hours as needed wheezing or shortness of breath.   aspirin EC 81 MG tablet Take 1 tablet (81 mg total) by mouth daily.   atorvastatin (LIPITOR) 40 MG tablet TAKE 1 TABLET ONCE DAILY AT 6PM   buPROPion (WELLBUTRIN SR) 150 MG 12 hr tablet Take 1 tablet (150 mg total) by mouth daily with breakfast.   cetaphil (CETAPHIL) lotion Apply 1 application topically 2 (two) times daily. For rash. Apply after washing with [facility] shower gel and rinsing with clear warm water.   diclofenac (VOLTAREN) 75 MG EC tablet Take 1 tablet (75 mg total) by mouth 2 (two) times daily.   DULoxetine (CYMBALTA) 60 MG capsule Take 1 capsule (60 mg total) by mouth daily. WITH A FULL STOMACH AT SUPPER TIME   fexofenadine (ALLEGRA) 180 MG tablet Take 1 tablet (180 mg total) by mouth daily. For allergy symptoms   fluocinonide-emollient (LIDEX-E) 0.05 % cream Apply 1 application topically 2 (two) times daily. To affected areas   fluticasone (FLONASE) 50 MCG/ACT nasal spray Place 2 sprays into both nostrils daily as needed for allergies or rhinitis.   fluticasone furoate-vilanterol (BREO ELLIPTA) 100-25 MCG/INH AEPB Inhale 1 puff into the lungs daily.   Ipratropium-Albuterol (COMBIVENT RESPIMAT) 20-100 MCG/ACT AERS respimat Inhale 1 puff into the lungs every 6 (six) hours   isosorbide mononitrate (IMDUR) 60 MG 24 hr tablet Take 1 tablet (60 mg total) by mouth daily. (NEEDS TO BE SEEN BEFORE NEXT REFILL)   metoprolol tartrate (LOPRESSOR) 25 MG tablet TAKE (1) TABLET TWICE A DAY.   mirtazapine (REMERON) 15 MG tablet TAKE 1 TABLET AT  BEDTIME AS NEEDED FOR SLEEP   nitroGLYCERIN (NITROSTAT) 0.4 MG SL tablet Place 1 tablet (0.4 mg total) under the tongue every 5 (five) minutes x 3 doses as needed for chest pain.   pantoprazole (PROTONIX) 40 MG tablet TAKE (1) TABLET TWICE A DAY.   tamsulosin (FLOMAX) 0.4 MG CAPS capsule Take 2 capsules (0.8 mg total) by mouth daily after supper.    Facility-Administered Encounter Medications as of 02/11/2021  Medication   cyanocobalamin ((VITAMIN B-12)) injection 1,000 mcg    Patient Active Problem List   Diagnosis Date Noted   Back pain without sciatica 10/31/2020   Degenerative disc disease, lumbar 01/16/2019   Depression    Hypokalemia 06/27/2018   Seizure disorder ([place]) 02/07/2018   Apraxia of speech 10/13/2017   Left-sided weakness 10/13/2017   Coronary artery disease of native artery of native heart with stable angina pectoris ([place])    Illiteracy 10/23/2016   Carpal tunnel syndrome of right wrist 06/15/2016   GAD (generalized anxiety disorder) 05/01/2016   History of MI (myocardial infarction) 02/26/2015   Dyslipidemia, goal LDL below 70 10/08/2009   Infantile cerebral palsy ([place]) 10/08/2009   Essential hypertension 10/08/2009    Conditions to be addressed/monitored: monitor client management of depression and anxiety issues  Care Plan : [name]  Updates made by [name], [name] since 02/11/2021 12:00 AM     Problem: Emotional Distress      Goal: Emotional Health Supported;Manage ADLs; Manage depression issues   Start Date: 02/11/2021  Expected End Date: 05/12/2021  This Visit's Progress: On track  Recent Progress: On track  Priority: Medium  Note:   Current Barriers:  Chronic Mental Health needs related to depression management and anxiety management Mobility issues Pain issues Illiteracy Suicidal Ideation/Homicidal Ideation: No  Clinical Social Work Goal(s):  patient will work with SW monthly by telephone or in person to reduce or manage symptoms related to depression and anxiety issues Patient will call RNCM or [name] as needed in next 30 days for CCM support Patient will communicate with [name] in next 30 days to discuss mobility issues of client and to discuss pain issues of client  Interventions: 1:1 collaboration with [name], MD regarding development and update of  comprehensive plan of care as evidenced by provider attestation and co-signature Discussed with [name] that he has appointment on 02/14/21 at 11:00 AM at [facility] for B12 injection.  Reminded client that [name] transport will pick him up at his home on 02/14/21 at 10:30 AM to transport him to his scheduled appointment Discussed with [name] his CNA support weekly in the home Discussed with [name] support from [facility] for his nursing needs Reviewed with Holger his family support. He said he is having reduced family support at this time Reviewed with [place] that [facility] is conducting a home visit with client this Thursday to determine home health care needs of client Provided counseling support for client Discussed ambulation needs of client  Patient Self Care Activities:  Self administers medications as prescribed Attends all scheduled provider appointments Performs ADL's independently  Patient Coping Strengths:  Family  Patient Self Care Deficits:  Mobility issues Needs some help occasionally with in home care needs  Patient Goals:  - spend time or talk with others at least 2 to 3 times per week - practice relaxation or meditation daily - keep a calendar with appointment dates  Follow Up Plan: [name] to call client on  04/04/21 at 10:00 to assess client needs at  that time.       [name].Virdia Ziesmer MSW, [name] Licensed Clinical Social Worker [facility] Care Management [phone]

## 2021-02-11 NOTE — Patient Instructions (Signed)
Visit Information  PATIENT GOALS:  Goals Addressed             This Visit's Progress    Manage My Emotions; Manage depression issues. Complete ADLs daily with assistance as needed       Timeframe:  Short-Term Goal Priority:  Medium Progress: On Track Start Date:            02/11/21                Expected End Date:          05/12/21             Follow Up Date 04/04/21 at 10:00 AM   Manage emotions. Manage depression issues . Complete ADLs daily with assistance as needed   Why is this important?   When you are stressed, down or upset, your body reacts too.  For example, your blood pressure may get higher; you may have a headache or stomachache.  When your emotions get the best of you, your body's ability to fight off cold and flu gets weak.  These steps will help you manage your emotions.     Patient Self Care Activities:  Self administers medications as prescribed Attends all scheduled provider appointments  Patient Coping Strengths:  Family Friends  Patient Self Care Deficits:  Mobility issues Depression issues Anxiety issues  Patient Goals:  - spend time or talk with others at least 2 to 3 times per week - practice relaxation or meditation daily - keep a calendar with appointment dates  Follow Up Plan: [name] to call client on  04/04/21 at 10:00 AM to assess client needs     [name].[name] MSW, [name] Licensed Clinical Social Worker [facility] Care Management [phone]

## 2021-02-12 ENCOUNTER — Ambulatory Visit: Payer: Medicare Other | Admitting: Licensed Clinical Social Worker

## 2021-02-12 DIAGNOSIS — J441 Chronic obstructive pulmonary disease with (acute) exacerbation: Secondary | ICD-10-CM

## 2021-02-12 DIAGNOSIS — F32A Depression, unspecified: Secondary | ICD-10-CM

## 2021-02-12 DIAGNOSIS — I10 Essential (primary) hypertension: Secondary | ICD-10-CM

## 2021-02-12 DIAGNOSIS — R482 Apraxia: Secondary | ICD-10-CM

## 2021-02-12 DIAGNOSIS — E538 Deficiency of other specified B group vitamins: Secondary | ICD-10-CM

## 2021-02-12 DIAGNOSIS — I209 Angina pectoris, unspecified: Secondary | ICD-10-CM

## 2021-02-12 DIAGNOSIS — I252 Old myocardial infarction: Secondary | ICD-10-CM

## 2021-02-12 DIAGNOSIS — G809 Cerebral palsy, unspecified: Secondary | ICD-10-CM

## 2021-02-12 DIAGNOSIS — I25118 Atherosclerotic heart disease of native coronary artery with other forms of angina pectoris: Secondary | ICD-10-CM

## 2021-02-12 DIAGNOSIS — Z550 Illiteracy and low-level literacy: Secondary | ICD-10-CM

## 2021-02-12 DIAGNOSIS — M5136 Other intervertebral disc degeneration, lumbar region: Secondary | ICD-10-CM

## 2021-02-12 DIAGNOSIS — F411 Generalized anxiety disorder: Secondary | ICD-10-CM

## 2021-02-12 DIAGNOSIS — E785 Hyperlipidemia, unspecified: Secondary | ICD-10-CM

## 2021-02-12 NOTE — Patient Instructions (Addendum)
Visit Information  PATIENT GOALS: Manage Emotions Manage Depression issues. Complete ADLs daily with  assistance needed  Timeframe:  Short-Term Goal Priority:  Medium Progress: On Track Start Date:            02/11/21                Expected End Date:          05/12/21             Follow Up Date 04/04/21 at 10:00 AM   Manage emotions. Manage depression issues . Complete ADLs daily with assistance as needed   Why is this important?   When you are stressed, down or upset, your body reacts too.  For example, your blood pressure may get higher; you may have a headache or stomachache.  When your emotions get the best of you, your body's ability to fight off cold and flu gets weak.  These steps will help you manage your emotions.     Patient Self Care Activities:  Self administers medications as prescribed Attends all scheduled provider appointments  Patient Coping Strengths:  Family Friends  Patient Self Care Deficits:  Mobility issues Depression issues Anxiety issues  Patient Goals:  - spend time or talk with others at least 2 to 3 times per week - practice relaxation or meditation daily - keep a calendar with appointment dates  Follow Up Plan: [name] to call client on  04/04/21 at 10:00 AM to assess client needs   Kallum Jorgensen.Joyelle Siedlecki MSW, [name] Licensed Clinical Social Worker [facility] Care Management [phone]

## 2021-02-12 NOTE — Chronic Care Management (AMB) (Signed)
Chronic Care Management    Clinical Social Work Note  02/12/2021 Name: [name] MRN: [id number] DOB: [birth year]  [name] is a 65 y.o. year old male who is a primary care patient of [name], James Gash, MD. The CCM team was consulted to assist the patient with chronic disease management and/or care coordination needs related to: [name] .   Engaged with patient James Rama, CNA for client (with client permission) by telephone for follow up visit in response to provider referral for social work chronic care management and care coordination services.   Consent to Services:  The patient was given information about Chronic Care Management services, agreed to services, and gave verbal consent prior to initiation of services.  Please see initial visit note for detailed documentation.   Patient agreed to services and consent obtained.   Assessment: Review of patient past medical history, allergies, medications, and health status, including review of relevant consultants reports was performed today as part of a comprehensive evaluation and provision of chronic care management and care coordination services.     SDOH (Social Determinants of Health) assessments and interventions performed:  SDOH Interventions    Flowsheet Row Most Recent Value  SDOH Interventions   Physical Activity Interventions Other (Comments)  [client has walking challenges,  he uses a cane to help him walk]  Stress Interventions Provide Counseling  [client has stress related to managing his medical needs. client has stress related to managing his daily activities]  Depression Interventions/Treatment  Currently on Treatment        Advanced Directives Status: See Vynca application for related entries.  CCM Care Plan  Allergies  Allergen Reactions   Chantix [Varenicline Tartrate] Nausea And Vomiting    Outpatient Encounter Medications as of 02/12/2021  Medication Sig   acetaminophen (TYLENOL) 325 MG  tablet Take 2 tablets (650 mg total) by mouth every 6 (six) hours as needed for mild pain or headache.   albuterol (PROAIR HFA) 108 (90 Base) MCG/ACT inhaler 1-2 puffs every 6 hours as needed wheezing or shortness of breath.   aspirin EC 81 MG tablet Take 1 tablet (81 mg total) by mouth daily.   atorvastatin (LIPITOR) 40 MG tablet TAKE 1 TABLET ONCE DAILY AT 6PM   buPROPion (WELLBUTRIN SR) 150 MG 12 hr tablet Take 1 tablet (150 mg total) by mouth daily with breakfast.   cetaphil (CETAPHIL) lotion Apply 1 application topically 2 (two) times daily. For rash. Apply after washing with [facility] shower gel and rinsing with clear warm water.   diclofenac (VOLTAREN) 75 MG EC tablet Take 1 tablet (75 mg total) by mouth 2 (two) times daily.   DULoxetine (CYMBALTA) 60 MG capsule Take 1 capsule (60 mg total) by mouth daily. WITH A FULL STOMACH AT SUPPER TIME   fexofenadine (ALLEGRA) 180 MG tablet Take 1 tablet (180 mg total) by mouth daily. For allergy symptoms   fluocinonide-emollient (LIDEX-E) 0.05 % cream Apply 1 application topically 2 (two) times daily. To affected areas   fluticasone (FLONASE) 50 MCG/ACT nasal spray Place 2 sprays into both nostrils daily as needed for allergies or rhinitis.   fluticasone furoate-vilanterol (BREO ELLIPTA) 100-25 MCG/INH AEPB Inhale 1 puff into the lungs daily.   Ipratropium-Albuterol (COMBIVENT RESPIMAT) 20-100 MCG/ACT AERS respimat Inhale 1 puff into the lungs every 6 (six) hours   isosorbide mononitrate (IMDUR) 60 MG 24 hr tablet Take 1 tablet (60 mg total) by mouth daily. (NEEDS TO BE SEEN BEFORE NEXT REFILL)   metoprolol tartrate (  LOPRESSOR) 25 MG tablet TAKE (1) TABLET TWICE A DAY.   mirtazapine (REMERON) 15 MG tablet TAKE 1 TABLET AT BEDTIME AS NEEDED FOR SLEEP   nitroGLYCERIN (NITROSTAT) 0.4 MG SL tablet Place 1 tablet (0.4 mg total) under the tongue every 5 (five) minutes x 3 doses as needed for chest pain.   pantoprazole (PROTONIX) 40 MG tablet TAKE (1)  TABLET TWICE A DAY.   tamsulosin (FLOMAX) 0.4 MG CAPS capsule Take 2 capsules (0.8 mg total) by mouth daily after supper.   Facility-Administered Encounter Medications as of 02/12/2021  Medication   cyanocobalamin ((VITAMIN B-12)) injection 1,000 mcg    Patient Active Problem List   Diagnosis Date Noted   Back pain without sciatica 10/31/2020   Degenerative disc disease, lumbar 01/16/2019   Depression    Hypokalemia 06/27/2018   Seizure disorder ([place]) 02/07/2018   Apraxia of speech 10/13/2017   Left-sided weakness 10/13/2017   Coronary artery disease of native artery of native heart with stable angina pectoris ([place])    Illiteracy 10/23/2016   Carpal tunnel syndrome of right wrist 06/15/2016   GAD (generalized anxiety disorder) 05/01/2016   History of MI (myocardial infarction) 02/26/2015   Dyslipidemia, goal LDL below 70 10/08/2009   Infantile cerebral palsy ([place]) 10/08/2009   Essential hypertension 10/08/2009    Conditions to be addressed/monitored: monitor client management of anxiety issues and of depression issues  Care Plan : [name]  Updates made by James Cabal, [name] since 02/12/2021 12:00 AM     Problem: Emotional Distress      Goal: Emotional Health Supported;Manage ADLs; Manage depression issues   Start Date: 02/11/2021  Expected End Date: 05/12/2021  This Visit's Progress: On track  Recent Progress: On track  Priority: Medium  Note:   Current Barriers:  Chronic Mental Health needs related to depression management and anxiety management Mobility issues Pain issues Illiteracy Suicidal Ideation/Homicidal Ideation: No  Clinical Social Work Goal(s):  patient will work with SW monthly by telephone or in person to reduce or manage symptoms related to depression and anxiety issues Patient will call RNCM or [name] as needed in next 30 days for CCM support Patient will communicate with [name] in next 30 days to discuss mobility issues of client and to  discuss pain issues of client  Interventions: 1:1 collaboration with James Fraise, MD regarding development and update of comprehensive plan of care as evidenced by provider attestation and co-signature Discussed with Thi that he has appointment on 02/14/21 at 11:00 AM at [facility] for B12 injection.  Reminded client that [name] transport will pick him up at his home on 02/14/21 at 10:30 AM to transport him to his scheduled appointment Reviewed with [name] that [facility] is conducting a home visit with client this Thursday to determine home health care needs of client [name], client, gave [name] verbal permission today to talk with his CNA, [name] about his needs.  [name] and Brandy talked of [facility] nurse visit with [name] tomorrow morning at home of client.  [name] provides in home care for client. Discussed with [name] that Janai has appointment for B12 injection at [facility] on 02/14/21 at 11:00 AM. Discussed with Brandy that [place] would transport client to and from his 02/14/21 appointment at [facility]  Patient Self Care Activities:  Self administers medications as prescribed Attends all scheduled provider appointments Performs ADL's independently  Patient Coping Strengths:  Family  Patient Self Care Deficits:  Mobility issues Needs some help occasionally with in  home care needs  Patient Goals:  - spend time or talk with others at least 2 to 3 times per week - practice relaxation or meditation daily - keep a calendar with appointment dates  Follow Up Plan: [name] to call client on  04/04/21 at 10:00 to assess client needs at that time.       [name].Jamirah Zelaya MSW, [name] Licensed Clinical Social Worker [facility] Care Management [phone]

## 2021-02-13 ENCOUNTER — Ambulatory Visit: Payer: Medicare Other

## 2021-02-14 ENCOUNTER — Other Ambulatory Visit: Payer: Self-pay

## 2021-02-14 ENCOUNTER — Ambulatory Visit (INDEPENDENT_AMBULATORY_CARE_PROVIDER_SITE_OTHER): Payer: Medicare Other

## 2021-02-14 DIAGNOSIS — E538 Deficiency of other specified B group vitamins: Secondary | ICD-10-CM | POA: Diagnosis not present

## 2021-02-14 NOTE — Progress Notes (Signed)
Pt tolerated b12 well. 

## 2021-02-17 DIAGNOSIS — E785 Hyperlipidemia, unspecified: Secondary | ICD-10-CM

## 2021-02-17 DIAGNOSIS — I25118 Atherosclerotic heart disease of native coronary artery with other forms of angina pectoris: Secondary | ICD-10-CM

## 2021-02-17 DIAGNOSIS — F32A Depression, unspecified: Secondary | ICD-10-CM | POA: Diagnosis not present

## 2021-02-17 DIAGNOSIS — I209 Angina pectoris, unspecified: Secondary | ICD-10-CM | POA: Diagnosis not present

## 2021-02-17 DIAGNOSIS — I252 Old myocardial infarction: Secondary | ICD-10-CM

## 2021-02-17 DIAGNOSIS — J441 Chronic obstructive pulmonary disease with (acute) exacerbation: Secondary | ICD-10-CM | POA: Diagnosis not present

## 2021-02-17 DIAGNOSIS — I10 Essential (primary) hypertension: Secondary | ICD-10-CM | POA: Diagnosis not present

## 2021-02-18 ENCOUNTER — Ambulatory Visit (INDEPENDENT_AMBULATORY_CARE_PROVIDER_SITE_OTHER): Payer: Medicare Other | Admitting: Licensed Clinical Social Worker

## 2021-02-18 DIAGNOSIS — R482 Apraxia: Secondary | ICD-10-CM

## 2021-02-18 DIAGNOSIS — F411 Generalized anxiety disorder: Secondary | ICD-10-CM

## 2021-02-18 DIAGNOSIS — I25118 Atherosclerotic heart disease of native coronary artery with other forms of angina pectoris: Secondary | ICD-10-CM

## 2021-02-18 DIAGNOSIS — Z550 Illiteracy and low-level literacy: Secondary | ICD-10-CM

## 2021-02-18 DIAGNOSIS — I10 Essential (primary) hypertension: Secondary | ICD-10-CM

## 2021-02-18 DIAGNOSIS — E785 Hyperlipidemia, unspecified: Secondary | ICD-10-CM

## 2021-02-18 DIAGNOSIS — F32A Depression, unspecified: Secondary | ICD-10-CM

## 2021-02-18 DIAGNOSIS — E538 Deficiency of other specified B group vitamins: Secondary | ICD-10-CM

## 2021-02-18 DIAGNOSIS — G809 Cerebral palsy, unspecified: Secondary | ICD-10-CM

## 2021-02-18 DIAGNOSIS — I252 Old myocardial infarction: Secondary | ICD-10-CM

## 2021-02-18 DIAGNOSIS — I209 Angina pectoris, unspecified: Secondary | ICD-10-CM

## 2021-02-18 DIAGNOSIS — J441 Chronic obstructive pulmonary disease with (acute) exacerbation: Secondary | ICD-10-CM

## 2021-02-18 DIAGNOSIS — M5136 Other intervertebral disc degeneration, lumbar region: Secondary | ICD-10-CM

## 2021-02-18 NOTE — Patient Instructions (Addendum)
Visit Information  Patient Goal:  Manage emotions; Manage depression issues. Complete ADLs daily with assistance as needed  Timeframe:  Short-Term Goal Priority:  Medium Progress: On Track Start Date:          02/18/21                Expected End Date:        05/19/21             Follow Up Date  02/27/21 at 3:00 PM   Manage emotions. Manage depression issues . Complete ADLs daily with assistance as needed   Why is this important?   When you are stressed, down or upset, your body reacts too.  For example, your blood pressure may get higher; you may have a headache or stomachache.  When your emotions get the best of you, your body's ability to fight off cold and flu gets weak.  These steps will help you manage your emotions.     Patient Self Care Activities:  Self administers medications as prescribed Attends all scheduled provider appointments  Patient Coping Strengths:  Family Friends  Patient Self Care Deficits:  Mobility issues Depression issues Anxiety issues  Patient Goals:  - spend time or talk with others at least 2 to 3 times per week - practice relaxation or meditation daily - keep a calendar with appointment dates  Follow Up Plan: [name] to call client on  02/27/21 at 3:00 PM to assess client needs   Mendell Bontempo.Emeka Lindner MSW, [name] Licensed Clinical Social Worker [facility] Care Management [phone]

## 2021-02-18 NOTE — Chronic Care Management (AMB) (Signed)
Chronic Care Management    Clinical Social Work Note  02/18/2021 Name: [name] MRN: [id number] DOB: 04/11/1956  [name] is a 65 y.o. year old male who is a primary care patient of [name], [name], MD. The CCM team was consulted to assist the patient with chronic disease management and/or care coordination needs related to: [name] .   Engaged with patient by telephone for follow up visit in response to provider referral for social work chronic care management and care coordination services.   Consent to Services:  The patient was given information about Chronic Care Management services, agreed to services, and gave verbal consent prior to initiation of services.  Please see initial visit note for detailed documentation.   Patient agreed to services and consent obtained.   Assessment: Review of patient past medical history, allergies, medications, and health status, including review of relevant consultants reports was performed today as part of a comprehensive evaluation and provision of chronic care management and care coordination services.     SDOH (Social Determinants of Health) assessments and interventions performed:  SDOH Interventions    Flowsheet Row Most Recent Value  SDOH Interventions   Physical Activity Interventions Other (Comments)  [walking challenges,  risk for falls]  Stress Interventions Provide Counseling  [client has stress related to meeting in home care needs]  Depression Interventions/Treatment  Currently on Treatment        Advanced Directives Status: See Vynca application for related entries.  CCM Care Plan  Allergies  Allergen Reactions   Chantix [Varenicline Tartrate] Nausea And Vomiting    Outpatient Encounter Medications as of 02/18/2021  Medication Sig   acetaminophen (TYLENOL) 325 MG tablet Take 2 tablets (650 mg total) by mouth every 6 (six) hours as needed for mild pain or headache.   albuterol (PROAIR HFA) 108 (90  Base) MCG/ACT inhaler 1-2 puffs every 6 hours as needed wheezing or shortness of breath.   aspirin EC 81 MG tablet Take 1 tablet (81 mg total) by mouth daily.   atorvastatin (LIPITOR) 40 MG tablet TAKE 1 TABLET ONCE DAILY AT 6PM   buPROPion (WELLBUTRIN SR) 150 MG 12 hr tablet Take 1 tablet (150 mg total) by mouth daily with breakfast.   cetaphil (CETAPHIL) lotion Apply 1 application topically 2 (two) times daily. For rash. Apply after washing with [facility] shower gel and rinsing with clear warm water.   diclofenac (VOLTAREN) 75 MG EC tablet Take 1 tablet (75 mg total) by mouth 2 (two) times daily.   DULoxetine (CYMBALTA) 60 MG capsule Take 1 capsule (60 mg total) by mouth daily. WITH A FULL STOMACH AT SUPPER TIME   fexofenadine (ALLEGRA) 180 MG tablet Take 1 tablet (180 mg total) by mouth daily. For allergy symptoms   fluocinonide-emollient (LIDEX-E) 0.05 % cream Apply 1 application topically 2 (two) times daily. To affected areas   fluticasone (FLONASE) 50 MCG/ACT nasal spray Place 2 sprays into both nostrils daily as needed for allergies or rhinitis.   fluticasone furoate-vilanterol (BREO ELLIPTA) 100-25 MCG/INH AEPB Inhale 1 puff into the lungs daily.   Ipratropium-Albuterol (COMBIVENT RESPIMAT) 20-100 MCG/ACT AERS respimat Inhale 1 puff into the lungs every 6 (six) hours   isosorbide mononitrate (IMDUR) 60 MG 24 hr tablet Take 1 tablet (60 mg total) by mouth daily. (NEEDS TO BE SEEN BEFORE NEXT REFILL)   metoprolol tartrate (LOPRESSOR) 25 MG tablet TAKE (1) TABLET TWICE A DAY.   mirtazapine (REMERON) 15 MG tablet TAKE 1 TABLET AT BEDTIME  AS NEEDED FOR SLEEP   nitroGLYCERIN (NITROSTAT) 0.4 MG SL tablet Place 1 tablet (0.4 mg total) under the tongue every 5 (five) minutes x 3 doses as needed for chest pain.   pantoprazole (PROTONIX) 40 MG tablet TAKE (1) TABLET TWICE A DAY.   tamsulosin (FLOMAX) 0.4 MG CAPS capsule Take 2 capsules (0.8 mg total) by mouth daily after supper.    Facility-Administered Encounter Medications as of 02/18/2021  Medication   cyanocobalamin ((VITAMIN B-12)) injection 1,000 mcg    Patient Active Problem List   Diagnosis Date Noted   Back pain without sciatica 10/31/2020   Degenerative disc disease, lumbar 01/16/2019   Depression    Hypokalemia 06/27/2018   Seizure disorder ([place]) 02/07/2018   Apraxia of speech 10/13/2017   Left-sided weakness 10/13/2017   Coronary artery disease of native artery of native heart with stable angina pectoris ([place])    Illiteracy 10/23/2016   Carpal tunnel syndrome of right wrist 06/15/2016   GAD (generalized anxiety disorder) 05/01/2016   History of MI (myocardial infarction) 02/26/2015   Dyslipidemia, goal LDL below 70 10/08/2009   Infantile cerebral palsy ([place]) 10/08/2009   Essential hypertension 10/08/2009    Conditions to be addressed/monitored: monitor client management of depression issues or anxiety issues  Care Plan : [name]  Updates made by [name], [name] since 02/18/2021 12:00 AM     Problem: Emotional Distress      Goal: Emotional Health Supported;Manage ADLs; Manage depression issues   Start Date: 02/18/2021  Expected End Date: 05/19/2021  This Visit's Progress: On track  Recent Progress: On track  Priority: Medium  Note:   Current Barriers:  Chronic Mental Health needs related to depression management and anxiety management Mobility issues Pain issues Illiteracy Suicidal Ideation/Homicidal Ideation: No  Clinical Social Work Goal(s):  patient will work with SW monthly by telephone or in person to reduce or manage symptoms related to depression and anxiety issues Patient will call RNCM or [name] as needed in next 30 days for CCM support Patient will communicate with [name] in next 30 days to discuss mobility issues of client and to discuss pain issues of client  Interventions: 1:1 collaboration with [name], MD regarding development and update of  comprehensive plan of care as evidenced by provider attestation and co-signature Discussed with [name] nurse home assessment visit with client last week from [facility]. Discussed with Demetria initial denial of increase of hours from [facility] for client [name] called representative of [facility] today and talked with representative of [facility] form completion of client. [name] called Jamelle Haring, LPN  at [facility] today, and talked with Tye [place] about information from [facility] regarding [facility] form completion [name] informed client today that Jamelle Haring, LPN would talk again with Dr. Livia Snellen regarding form completion and Tye Maryland would see if [facility] application could possibly be resubmitted to [facility]for consideration. Client agreed to this plan Encouraged client to be patient with [facility] application request.  Client appreciates home health aide support and is very pleased with current home health aide  Patient Self Care Activities:  Self administers medications as prescribed Attends all scheduled provider appointments Performs ADL's independently  Patient Coping Strengths:  Family  Patient Self Care Deficits:  Mobility issues Needs some help occasionally with in home care needs  Patient Goals:  - spend time or talk with others at least 2 to 3 times per week - practice relaxation or meditation daily - keep a  calendar with appointment dates  Follow Up Plan: [name] to call client on 02/27/21 at 3:00 PM to assess client needs at that time.       [name].Josseline Reddin MSW, [name] Licensed Clinical Social Worker [facility] Care Management [phone]

## 2021-02-24 ENCOUNTER — Ambulatory Visit: Payer: Medicare Other | Admitting: Licensed Clinical Social Worker

## 2021-02-24 DIAGNOSIS — M5136 Other intervertebral disc degeneration, lumbar region: Secondary | ICD-10-CM

## 2021-02-24 DIAGNOSIS — I252 Old myocardial infarction: Secondary | ICD-10-CM

## 2021-02-24 DIAGNOSIS — F411 Generalized anxiety disorder: Secondary | ICD-10-CM

## 2021-02-24 DIAGNOSIS — E785 Hyperlipidemia, unspecified: Secondary | ICD-10-CM

## 2021-02-24 DIAGNOSIS — I25118 Atherosclerotic heart disease of native coronary artery with other forms of angina pectoris: Secondary | ICD-10-CM

## 2021-02-24 DIAGNOSIS — M51369 Other intervertebral disc degeneration, lumbar region without mention of lumbar back pain or lower extremity pain: Secondary | ICD-10-CM

## 2021-02-24 DIAGNOSIS — I10 Essential (primary) hypertension: Secondary | ICD-10-CM

## 2021-02-24 DIAGNOSIS — J441 Chronic obstructive pulmonary disease with (acute) exacerbation: Secondary | ICD-10-CM

## 2021-02-24 DIAGNOSIS — E538 Deficiency of other specified B group vitamins: Secondary | ICD-10-CM

## 2021-02-24 DIAGNOSIS — I209 Angina pectoris, unspecified: Secondary | ICD-10-CM

## 2021-02-24 DIAGNOSIS — G809 Cerebral palsy, unspecified: Secondary | ICD-10-CM

## 2021-02-24 DIAGNOSIS — Z550 Illiteracy and low-level literacy: Secondary | ICD-10-CM

## 2021-02-24 DIAGNOSIS — F32A Depression, unspecified: Secondary | ICD-10-CM

## 2021-02-24 DIAGNOSIS — R482 Apraxia: Secondary | ICD-10-CM

## 2021-02-24 NOTE — Chronic Care Management (AMB) (Signed)
Chronic Care Management    Clinical Social Work Note  02/24/2021 Name: [name] MRN: [id number] DOB: Oct 22, 1955  [name] is a 65 y.o. year old male who is a primary care patient of [name], [name], MD. The CCM team was consulted to assist the patient with chronic disease management and/or care coordination needs related to: [name] .   Engaged with patient by telephone for follow up visit in response to provider referral for social work chronic care management and care coordination services.   Consent to Services:  The patient was given information about Chronic Care Management services, agreed to services, and gave verbal consent prior to initiation of services.  Please see initial visit note for detailed documentation.   Patient agreed to services and consent obtained.   Assessment: Review of patient past medical history, allergies, medications, and health status, including review of relevant consultants reports was performed today as part of a comprehensive evaluation and provision of chronic care management and care coordination services.     SDOH (Social Determinants of Health) assessments and interventions performed:  SDOH Interventions    Flowsheet Row Most Recent Value  SDOH Interventions   Physical Activity Interventions Other (Comments)  [client has walking challenges]  Stress Interventions Provide Counseling  [client has stress related to managing medical needs]  Depression Interventions/Treatment  Currently on Treatment        Advanced Directives Status: See Vynca application for related entries.  CCM Care Plan  Allergies  Allergen Reactions   Chantix [Varenicline Tartrate] Nausea And Vomiting    Outpatient Encounter Medications as of 02/24/2021  Medication Sig   acetaminophen (TYLENOL) 325 MG tablet Take 2 tablets (650 mg total) by mouth every 6 (six) hours as needed for mild pain or headache.   albuterol (PROAIR HFA) 108 (90 Base) MCG/ACT  inhaler 1-2 puffs every 6 hours as needed wheezing or shortness of breath.   aspirin EC 81 MG tablet Take 1 tablet (81 mg total) by mouth daily.   atorvastatin (LIPITOR) 40 MG tablet TAKE 1 TABLET ONCE DAILY AT 6PM   buPROPion (WELLBUTRIN SR) 150 MG 12 hr tablet Take 1 tablet (150 mg total) by mouth daily with breakfast.   cetaphil (CETAPHIL) lotion Apply 1 application topically 2 (two) times daily. For rash. Apply after washing with [facility] shower gel and rinsing with clear warm water.   diclofenac (VOLTAREN) 75 MG EC tablet Take 1 tablet (75 mg total) by mouth 2 (two) times daily.   DULoxetine (CYMBALTA) 60 MG capsule Take 1 capsule (60 mg total) by mouth daily. WITH A FULL STOMACH AT SUPPER TIME   fexofenadine (ALLEGRA) 180 MG tablet Take 1 tablet (180 mg total) by mouth daily. For allergy symptoms   fluocinonide-emollient (LIDEX-E) 0.05 % cream Apply 1 application topically 2 (two) times daily. To affected areas   fluticasone (FLONASE) 50 MCG/ACT nasal spray Place 2 sprays into both nostrils daily as needed for allergies or rhinitis.   fluticasone furoate-vilanterol (BREO ELLIPTA) 100-25 MCG/INH AEPB Inhale 1 puff into the lungs daily.   Ipratropium-Albuterol (COMBIVENT RESPIMAT) 20-100 MCG/ACT AERS respimat Inhale 1 puff into the lungs every 6 (six) hours   isosorbide mononitrate (IMDUR) 60 MG 24 hr tablet Take 1 tablet (60 mg total) by mouth daily. (NEEDS TO BE SEEN BEFORE NEXT REFILL)   metoprolol tartrate (LOPRESSOR) 25 MG tablet TAKE (1) TABLET TWICE A DAY.   mirtazapine (REMERON) 15 MG tablet TAKE 1 TABLET AT BEDTIME AS NEEDED FOR SLEEP  nitroGLYCERIN (NITROSTAT) 0.4 MG SL tablet Place 1 tablet (0.4 mg total) under the tongue every 5 (five) minutes x 3 doses as needed for chest pain.   pantoprazole (PROTONIX) 40 MG tablet TAKE (1) TABLET TWICE A DAY.   tamsulosin (FLOMAX) 0.4 MG CAPS capsule Take 2 capsules (0.8 mg total) by mouth daily after supper.   Facility-Administered  Encounter Medications as of 02/24/2021  Medication   cyanocobalamin ((VITAMIN B-12)) injection 1,000 mcg    Patient Active Problem List   Diagnosis Date Noted   Back pain without sciatica 10/31/2020   Degenerative disc disease, lumbar 01/16/2019   Depression    Hypokalemia 06/27/2018   Seizure disorder ([place]) 02/07/2018   Apraxia of speech 10/13/2017   Left-sided weakness 10/13/2017   Coronary artery disease of native artery of native heart with stable angina pectoris ([place])    Illiteracy 10/23/2016   Carpal tunnel syndrome of right wrist 06/15/2016   GAD (generalized anxiety disorder) 05/01/2016   History of MI (myocardial infarction) 02/26/2015   Dyslipidemia, goal LDL below 70 10/08/2009   Infantile cerebral palsy ([place]) 10/08/2009   Essential hypertension 10/08/2009    Conditions to be addressed/monitored: monitor client management of depression and anxiety issues  Care Plan : [name]  Updates made by [name], [name] since 02/24/2021 12:00 AM     Problem: Emotional Distress      Goal: Emotional Health Supported;Manage ADLs; Manage depression issues   Start Date: 02/24/2021  Expected End Date: 05/23/2021  This Visit's Progress: On track  Recent Progress: On track  Priority: Medium  Note:   Current Barriers:  Chronic Mental Health needs related to depression management and anxiety management Mobility issues Pain issues Illiteracy Suicidal Ideation/Homicidal Ideation: No  Clinical Social Work Goal(s):  patient will work with SW monthly by telephone or in person to reduce or manage symptoms related to depression and anxiety issues Patient will call RNCM or [name] as needed in next 30 days for CCM support Patient will communicate with [name] in next 30 days to discuss mobility issues of client and to discuss pain issues of client  Interventions: 1:1 collaboration with [name], MD regarding development and update of comprehensive plan of care as evidenced  by provider attestation and co-signature Discussed with [name] nurse home assessment visit with client scheduled for this week on Wednesday morning from [facility]. Discussed with Claire that [place], his CNA, will be present with him for nurse visit on Wednesday morning at his home Reviewed with [name] walking challenges. He said he is still trying to mow his yard but has to use a riding lawnmower to [place] yard.. Discussed with Wyman that [facility] would determine if he were approved or not for more in home assistance hours from CNA Provided counseling support for client  Patient Self Care Activities:  Self administers medications as prescribed Attends all scheduled provider appointments Performs ADL's independently  Patient Coping Strengths:  Family  Patient Self Care Deficits:  Mobility issues Needs some help occasionally with in home care needs  Patient Goals:  - spend time or talk with others at least 2 to 3 times per week - practice relaxation or meditation daily - keep a calendar with appointment dates  Follow Up Plan: [name] to call client on 02/27/21 at 3:00 PM to assess client needs at that time.       [name].Herbert Aguinaldo MSW, [name] Licensed Clinical Social Worker [facility] Care Management [phone]

## 2021-02-24 NOTE — Patient Instructions (Addendum)
Visit Information  Patient Goals:  Manage emotions. Manage depression issues; Complete ADLs daily with assistance as needed.  Timeframe:  Short-Term Goal Priority:  Medium Progress: On Track Start Date:         02/24/21                 Expected End Date:        05/23/21             Follow Up Date  02/27/21 at 3:00 PM   Manage emotions. Manage depression issues . Complete ADLs daily with assistance as needed   Why is this important?   When you are stressed, down or upset, your body reacts too.  For example, your blood pressure may get higher; you may have a headache or stomachache.  When your emotions get the best of you, your body's ability to fight off cold and flu gets weak.  These steps will help you manage your emotions.     Patient Self Care Activities:  Self administers medications as prescribed Attends all scheduled provider appointments  Patient Coping Strengths:  Family Friends  Patient Self Care Deficits:  Mobility issues Depression issues Anxiety issues  Patient Goals:  - spend time or talk with others at least 2 to 3 times per week - practice relaxation or meditation daily - keep a calendar with appointment dates  Follow Up Plan: [name] to call client on  02/27/21 at 3:00 PM to assess client needs   Mate Alegria.Shundra Wirsing MSW, [name] Licensed Clinical Social Worker [facility] Care Management [phone]

## 2021-02-25 ENCOUNTER — Ambulatory Visit: Payer: Medicare Other | Admitting: Licensed Clinical Social Worker

## 2021-02-25 DIAGNOSIS — G809 Cerebral palsy, unspecified: Secondary | ICD-10-CM

## 2021-02-25 DIAGNOSIS — E785 Hyperlipidemia, unspecified: Secondary | ICD-10-CM

## 2021-02-25 DIAGNOSIS — R482 Apraxia: Secondary | ICD-10-CM

## 2021-02-25 DIAGNOSIS — Z550 Illiteracy and low-level literacy: Secondary | ICD-10-CM

## 2021-02-25 DIAGNOSIS — I209 Angina pectoris, unspecified: Secondary | ICD-10-CM

## 2021-02-25 DIAGNOSIS — F411 Generalized anxiety disorder: Secondary | ICD-10-CM

## 2021-02-25 DIAGNOSIS — J441 Chronic obstructive pulmonary disease with (acute) exacerbation: Secondary | ICD-10-CM

## 2021-02-25 DIAGNOSIS — F32A Depression, unspecified: Secondary | ICD-10-CM

## 2021-02-25 DIAGNOSIS — I25118 Atherosclerotic heart disease of native coronary artery with other forms of angina pectoris: Secondary | ICD-10-CM

## 2021-02-25 DIAGNOSIS — I252 Old myocardial infarction: Secondary | ICD-10-CM

## 2021-02-25 DIAGNOSIS — I10 Essential (primary) hypertension: Secondary | ICD-10-CM

## 2021-02-25 DIAGNOSIS — E538 Deficiency of other specified B group vitamins: Secondary | ICD-10-CM

## 2021-02-25 DIAGNOSIS — M5136 Other intervertebral disc degeneration, lumbar region: Secondary | ICD-10-CM

## 2021-02-25 NOTE — Chronic Care Management (AMB) (Signed)
Chronic Care Management    Clinical Social Work Note  02/25/2021 Name: [name] MRN: [id number] DOB: [birth year]  [name] is a 65 y.o. year old male who is a primary care patient of [name], [name], MD. The CCM team was consulted to assist the patient with chronic disease management and/or care coordination needs related to: [name] .   Engaged with patient by telephone for follow up visit in response to provider referral for social work chronic care management and care coordination services.   Consent to Services:  The patient was given information about Chronic Care Management services, agreed to services, and gave verbal consent prior to initiation of services.  Please see initial visit note for detailed documentation.   Patient agreed to services and consent obtained.   Assessment: Review of patient past medical history, allergies, medications, and health status, including review of relevant consultants reports was performed today as part of a comprehensive evaluation and provision of chronic care management and care coordination services.     SDOH (Social Determinants of Health) assessments and interventions performed:  SDOH Interventions    Flowsheet Row Most Recent Value  SDOH Interventions   Physical Activity Interventions Other (Comments)  [client has walking challenges]  Stress Interventions Provide Counseling  [client has stress related to managing in home care needs]        Advanced Directives Status: See Vynca application for related entries.  CCM Care Plan  Allergies  Allergen Reactions   Chantix [Varenicline Tartrate] Nausea And Vomiting    Outpatient Encounter Medications as of 02/25/2021  Medication Sig   acetaminophen (TYLENOL) 325 MG tablet Take 2 tablets (650 mg total) by mouth every 6 (six) hours as needed for mild pain or headache.   albuterol (PROAIR HFA) 108 (90 Base) MCG/ACT inhaler 1-2 puffs every 6 hours as needed wheezing or  shortness of breath.   aspirin EC 81 MG tablet Take 1 tablet (81 mg total) by mouth daily.   atorvastatin (LIPITOR) 40 MG tablet TAKE 1 TABLET ONCE DAILY AT 6PM   buPROPion (WELLBUTRIN SR) 150 MG 12 hr tablet Take 1 tablet (150 mg total) by mouth daily with breakfast.   cetaphil (CETAPHIL) lotion Apply 1 application topically 2 (two) times daily. For rash. Apply after washing with [facility] shower gel and rinsing with clear warm water.   diclofenac (VOLTAREN) 75 MG EC tablet Take 1 tablet (75 mg total) by mouth 2 (two) times daily.   DULoxetine (CYMBALTA) 60 MG capsule Take 1 capsule (60 mg total) by mouth daily. WITH A FULL STOMACH AT SUPPER TIME   fexofenadine (ALLEGRA) 180 MG tablet Take 1 tablet (180 mg total) by mouth daily. For allergy symptoms   fluocinonide-emollient (LIDEX-E) 0.05 % cream Apply 1 application topically 2 (two) times daily. To affected areas   fluticasone (FLONASE) 50 MCG/ACT nasal spray Place 2 sprays into both nostrils daily as needed for allergies or rhinitis.   fluticasone furoate-vilanterol (BREO ELLIPTA) 100-25 MCG/INH AEPB Inhale 1 puff into the lungs daily.   Ipratropium-Albuterol (COMBIVENT RESPIMAT) 20-100 MCG/ACT AERS respimat Inhale 1 puff into the lungs every 6 (six) hours   isosorbide mononitrate (IMDUR) 60 MG 24 hr tablet Take 1 tablet (60 mg total) by mouth daily. (NEEDS TO BE SEEN BEFORE NEXT REFILL)   metoprolol tartrate (LOPRESSOR) 25 MG tablet TAKE (1) TABLET TWICE A DAY.   mirtazapine (REMERON) 15 MG tablet TAKE 1 TABLET AT BEDTIME AS NEEDED FOR SLEEP   nitroGLYCERIN (NITROSTAT) 0.4  MG SL tablet Place 1 tablet (0.4 mg total) under the tongue every 5 (five) minutes x 3 doses as needed for chest pain.   pantoprazole (PROTONIX) 40 MG tablet TAKE (1) TABLET TWICE A DAY.   tamsulosin (FLOMAX) 0.4 MG CAPS capsule Take 2 capsules (0.8 mg total) by mouth daily after supper.   Facility-Administered Encounter Medications as of 02/25/2021  Medication    cyanocobalamin ((VITAMIN B-12)) injection 1,000 mcg    Patient Active Problem List   Diagnosis Date Noted   Back pain without sciatica 10/31/2020   Degenerative disc disease, lumbar 01/16/2019   Depression    Hypokalemia 06/27/2018   Seizure disorder ([place]) 02/07/2018   Apraxia of speech 10/13/2017   Left-sided weakness 10/13/2017   Coronary artery disease of native artery of native heart with stable angina pectoris ([place])    Illiteracy 10/23/2016   Carpal tunnel syndrome of right wrist 06/15/2016   GAD (generalized anxiety disorder) 05/01/2016   History of MI (myocardial infarction) 02/26/2015   Dyslipidemia, goal LDL below 70 10/08/2009   Infantile cerebral palsy ([place]) 10/08/2009   Essential hypertension 10/08/2009    Conditions to be addressed/monitored: monitor client management of depression issues  Care Plan : [name]  Updates made by [name], [name] since 02/25/2021 12:00 AM     Problem: Emotional Distress      Goal: Emotional Health Supported;Manage ADLs; Manage depression issues   Start Date: 02/24/2021  Expected End Date: 05/23/2021  This Visit's Progress: On track  Recent Progress: On track  Priority: Medium  Note:   Current Barriers:  Chronic Mental Health needs related to depression management and anxiety management Mobility issues Pain issues Illiteracy Suicidal Ideation/Homicidal Ideation: No  Clinical Social Work Goal(s):  patient will work with SW monthly by telephone or in person to reduce or manage symptoms related to depression and anxiety issues Patient will call RNCM or [name] as needed in next 30 days for CCM support Patient will communicate with [name] in next 30 days to discuss mobility issues of client and to discuss pain issues of client  Interventions: 1:1 collaboration with [name], MD regarding development and update of comprehensive plan of care as evidenced by provider attestation and co-signature Discussed with client  that Dr. Livia Snellen had completed form for [facility] support for client and that form had been faxed to [facility] yesterday for their review Provided counseling support for client  Patient Self Care Activities:  Self administers medications as prescribed Attends all scheduled provider appointments Performs ADL's independently  Patient Coping Strengths:  Family  Patient Self Care Deficits:  Mobility issues Needs some help occasionally with in home care needs  Patient Goals:  - spend time or talk with others at least 2 to 3 times per week - practice relaxation or meditation daily - keep a calendar with appointment dates  Follow Up Plan: [name] to call client on 02/27/21 at 3:00 PM to assess client needs at that time.       [name].Meilani Edmundson MSW, [name] Licensed Clinical Social Worker [facility] Care Management [phone]

## 2021-02-25 NOTE — Patient Instructions (Addendum)
Visit Information  Patient Goals: Manage Emotions. Manage Depression issues. Complete ADLs daily with assistance as needed   Timeframe:  Short-Term Goal Priority:  Medium Progress: On Track Start Date:         02/24/21                 Expected End Date:        05/23/21             Follow Up Date  02/27/21 at 3:00 PM   Manage emotions. Manage depression issues . Complete ADLs daily with assistance as needed   Why is this important?   When you are stressed, down or upset, your body reacts too.  For example, your blood pressure may get higher; you may have a headache or stomachache.  When your emotions get the best of you, your body's ability to fight off cold and flu gets weak.  These steps will help you manage your emotions.     Patient Self Care Activities:  Self administers medications as prescribed Attends all scheduled provider appointments  Patient Coping Strengths:  Family Friends  Patient Self Care Deficits:  Mobility issues Depression issues Anxiety issues  Patient Goals:  - spend time or talk with others at least 2 to 3 times per week - practice relaxation or meditation daily - keep a calendar with appointment dates  Follow Up Plan: [name] to call client on  02/27/21 at 3:00 PM to assess client needs   Davontae Prusinski.Keilana Morlock MSW, [name] Licensed Clinical Social Worker [facility] Care Management [phone]

## 2021-02-26 ENCOUNTER — Other Ambulatory Visit: Payer: Self-pay | Admitting: Family Medicine

## 2021-02-26 DIAGNOSIS — F3341 Major depressive disorder, recurrent, in partial remission: Secondary | ICD-10-CM

## 2021-02-26 DIAGNOSIS — I209 Angina pectoris, unspecified: Secondary | ICD-10-CM

## 2021-02-26 DIAGNOSIS — F411 Generalized anxiety disorder: Secondary | ICD-10-CM

## 2021-02-26 DIAGNOSIS — I10 Essential (primary) hypertension: Secondary | ICD-10-CM

## 2021-02-26 DIAGNOSIS — I25118 Atherosclerotic heart disease of native coronary artery with other forms of angina pectoris: Secondary | ICD-10-CM

## 2021-02-27 ENCOUNTER — Ambulatory Visit: Payer: Medicare Other | Admitting: Licensed Clinical Social Worker

## 2021-02-27 DIAGNOSIS — I252 Old myocardial infarction: Secondary | ICD-10-CM

## 2021-02-27 DIAGNOSIS — I10 Essential (primary) hypertension: Secondary | ICD-10-CM

## 2021-02-27 DIAGNOSIS — F411 Generalized anxiety disorder: Secondary | ICD-10-CM

## 2021-02-27 DIAGNOSIS — J441 Chronic obstructive pulmonary disease with (acute) exacerbation: Secondary | ICD-10-CM

## 2021-02-27 DIAGNOSIS — E538 Deficiency of other specified B group vitamins: Secondary | ICD-10-CM

## 2021-02-27 DIAGNOSIS — G809 Cerebral palsy, unspecified: Secondary | ICD-10-CM

## 2021-02-27 DIAGNOSIS — Z550 Illiteracy and low-level literacy: Secondary | ICD-10-CM

## 2021-02-27 DIAGNOSIS — R482 Apraxia: Secondary | ICD-10-CM

## 2021-02-27 DIAGNOSIS — F32A Depression, unspecified: Secondary | ICD-10-CM

## 2021-02-27 DIAGNOSIS — M5136 Other intervertebral disc degeneration, lumbar region: Secondary | ICD-10-CM

## 2021-02-27 DIAGNOSIS — I209 Angina pectoris, unspecified: Secondary | ICD-10-CM

## 2021-02-27 DIAGNOSIS — I25118 Atherosclerotic heart disease of native coronary artery with other forms of angina pectoris: Secondary | ICD-10-CM

## 2021-02-27 DIAGNOSIS — E785 Hyperlipidemia, unspecified: Secondary | ICD-10-CM

## 2021-02-27 NOTE — Patient Instructions (Addendum)
Visit Information  Patient Goals:  Manage emotions; Manage depression issues; Complete ADLs daily with  assistance as needed  Timeframe:  Short-Term Goal Priority:  Medium Progress: On Track Start Date:         02/24/21                 Expected End Date:        05/23/21             Follow Up Date  04/29/21 at 11:15 AM   Manage emotions. Manage depression issues . Complete ADLs daily with assistance as needed   Why is this important?   When you are stressed, down or upset, your body reacts too.  For example, your blood pressure may get higher; you may have a headache or stomachache.  When your emotions get the best of you, your body's ability to fight off cold and flu gets weak.  These steps will help you manage your emotions.     Patient Self Care Activities:  Self administers medications as prescribed Attends all scheduled provider appointments  Patient Coping Strengths:  Family Friends  Patient Self Care Deficits:  Mobility issues Depression issues Anxiety issues  Patient Goals:  - spend time or talk with others at least 2 to 3 times per week - practice relaxation or meditation daily - keep a calendar with appointment dates  Follow Up Plan: [name] to call client on  04/29/21 at 11:15 AM to assess client needs   [name].Harmony Sandell MSW, [name] Licensed Clinical Social Worker [facility] Care Management [phone]

## 2021-02-27 NOTE — Chronic Care Management (AMB) (Signed)
Chronic Care Management    Clinical Social Work Note  02/27/2021 Name: [name] MRN: [id number] DOB: [birth year]  [name] is a 65 y.o. year old male who is a primary care patient of [name], [name], MD. The CCM team was consulted to assist the patient with chronic disease management and/or care coordination needs related to: [name] .   Engaged with patient by telephone for follow up visit in response to provider referral for social work chronic care management and care coordination services.   Consent to Services:  The patient was given information about Chronic Care Management services, agreed to services, and gave verbal consent prior to initiation of services.  Please see initial visit note for detailed documentation.   Patient agreed to services and consent obtained.   Assessment: Review of patient past medical history, allergies, medications, and health status, including review of relevant consultants reports was performed today as part of a comprehensive evaluation and provision of chronic care management and care coordination services.     SDOH (Social Determinants of Health) assessments and interventions performed:  SDOH Interventions    Flowsheet Row Most Recent Value  SDOH Interventions   Physical Activity Interventions Other (Comments)  [client has walking challenges. he has a cane to help him walk. he sometimes has weakness in his legs]  Stress Interventions Provide Counseling  [client has stress over managing medical needs. client has some stress related to financial needs]  Depression Interventions/Treatment  Currently on Treatment        Advanced Directives Status: See Vynca application for related entries.  CCM Care Plan  Allergies  Allergen Reactions   Chantix [Varenicline Tartrate] Nausea And Vomiting    Outpatient Encounter Medications as of 02/27/2021  Medication Sig   celecoxib (CELEBREX) 200 MG capsule TAKE (1) CAPSULE DAILY AS  NEEDED.   acetaminophen (TYLENOL) 325 MG tablet Take 2 tablets (650 mg total) by mouth every 6 (six) hours as needed for mild pain or headache.   albuterol (PROAIR HFA) 108 (90 Base) MCG/ACT inhaler 1-2 puffs every 6 hours as needed wheezing or shortness of breath.   aspirin EC 81 MG tablet Take 1 tablet (81 mg total) by mouth daily.   atorvastatin (LIPITOR) 40 MG tablet TAKE 1 TABLET ONCE DAILY AT 6PM   buPROPion (WELLBUTRIN SR) 150 MG 12 hr tablet Take 1 tablet (150 mg total) by mouth daily with breakfast.   cetaphil (CETAPHIL) lotion Apply 1 application topically 2 (two) times daily. For rash. Apply after washing with [facility] shower gel and rinsing with clear warm water.   DULoxetine (CYMBALTA) 60 MG capsule TAKE 1 CAPSULE ONCE DAILY WITH LARGEST MEAL   fexofenadine (ALLEGRA) 180 MG tablet Take 1 tablet (180 mg total) by mouth daily. For allergy symptoms   fluocinonide-emollient (LIDEX-E) 0.05 % cream Apply 1 application topically 2 (two) times daily. To affected areas   fluticasone (FLONASE) 50 MCG/ACT nasal spray Place 2 sprays into both nostrils daily as needed for allergies or rhinitis.   fluticasone furoate-vilanterol (BREO ELLIPTA) 100-25 MCG/INH AEPB Inhale 1 puff into the lungs daily.   Ipratropium-Albuterol (COMBIVENT RESPIMAT) 20-100 MCG/ACT AERS respimat Inhale 1 puff into the lungs every 6 (six) hours   isosorbide mononitrate (IMDUR) 60 MG 24 hr tablet Take 1 tablet (60 mg total) by mouth daily. (NEEDS TO BE SEEN BEFORE NEXT REFILL)   metoprolol tartrate (LOPRESSOR) 25 MG tablet TAKE (1) TABLET TWICE A DAY.   mirtazapine (REMERON) 15 MG tablet TAKE 1  TABLET AT BEDTIME AS NEEDED FOR SLEEP   nitroGLYCERIN (NITROSTAT) 0.4 MG SL tablet Place 1 tablet (0.4 mg total) under the tongue every 5 (five) minutes x 3 doses as needed for chest pain.   pantoprazole (PROTONIX) 40 MG tablet TAKE (1) TABLET TWICE A DAY.   tamsulosin (FLOMAX) 0.4 MG CAPS capsule Take 2 capsules (0.8 mg total) by  mouth daily after supper.   Facility-Administered Encounter Medications as of 02/27/2021  Medication   cyanocobalamin ((VITAMIN B-12)) injection 1,000 mcg    Patient Active Problem List   Diagnosis Date Noted   Back pain without sciatica 10/31/2020   Degenerative disc disease, lumbar 01/16/2019   Depression    Hypokalemia 06/27/2018   Seizure disorder ([place]) 02/07/2018   Apraxia of speech 10/13/2017   Left-sided weakness 10/13/2017   Coronary artery disease of native artery of native heart with stable angina pectoris ([place])    Illiteracy 10/23/2016   Carpal tunnel syndrome of right wrist 06/15/2016   GAD (generalized anxiety disorder) 05/01/2016   History of MI (myocardial infarction) 02/26/2015   Dyslipidemia, goal LDL below 70 10/08/2009   Infantile cerebral palsy ([place]) 10/08/2009   Essential hypertension 10/08/2009    Conditions to be addressed/monitored: monitor ambulation needs of client. Monitor client completion of ADLs. Monitor client management of anxiety issues  Care Plan : [name]  Updates made by [name], [name] since 02/27/2021 12:00 AM     Problem: Emotional Distress      Goal: Emotional Health Supported;Manage ADLs; Manage depression issues   Start Date: 02/24/2021  Expected End Date: 05/23/2021  This Visit's Progress: On track  Recent Progress: On track  Priority: Medium  Note:   Current Barriers:  Chronic Mental Health needs related to depression management and anxiety management Mobility issues Pain issues Illiteracy Suicidal Ideation/Homicidal Ideation: No  Clinical Social Work Goal(s):  patient will work with SW monthly by telephone or in person to reduce or manage symptoms related to depression and anxiety issues Patient will call RNCM or [name] as needed in next 30 days for CCM support Patient will communicate with [name] in next 30 days to discuss mobility issues of client and to discuss pain issues of client  Interventions: 1:1  collaboration with [name], MD regarding development and update of comprehensive plan of care as evidenced by provider attestation and co-signature Provided counseling support for client Discussed with client that his next B12 shot appointment in on 03/18/21.   Discussed with client that [facility] application request for client had been completed by Dr. Livia Snellen and had been faxed recenlty to [facility] for consideration Reviewed pain issues of client.  Reviewed appetite of client. Reviewed medication procurement with client Discussed ambulation of client. He said he has a cane to use to help him walk. He said sometimes he feels as if his legs give way and he does not have strength to stand. He has talked with Dr. Livia Snellen about this issue Reviewed family support for client. He said he has reduced family support Reviewed with client his past physical therapy experience. He said he had gone through a series of physical therapy sessions several months ago and they were very hard for him to complete Discussed in home care support from CNA, [place]. He said Theadora Rama helps him as scheduled in the home each week. He said she helps him with meals, cleaning, paying bills and going to grocery store Informed client that [place] is scheduled to call client  next Wednesday at 3:30 PM to discuss nursing needs of client  Patient Self Care Activities:  Self administers medications as prescribed Attends all scheduled provider appointments Performs ADL's independently  Patient Coping Strengths:  Family  Patient Self Care Deficits:  Mobility issues Needs some help occasionally with in home care needs  Patient Goals:  - spend time or talk with others at least 2 to 3 times per week - practice relaxation or meditation daily - keep a calendar with appointment dates  Follow Up Plan: [name] to call client on 04/29/21 at  11:15 AM to assess needs of client. [name].Trevor Wilkie MSW,  [name] Licensed Clinical Social Worker [facility] Care Management [phone]

## 2021-02-28 ENCOUNTER — Telehealth: Payer: Medicare Other

## 2021-03-05 ENCOUNTER — Telehealth: Payer: Medicare Other | Admitting: *Deleted

## 2021-03-05 DIAGNOSIS — G809 Cerebral palsy, unspecified: Secondary | ICD-10-CM

## 2021-03-05 DIAGNOSIS — F3341 Major depressive disorder, recurrent, in partial remission: Secondary | ICD-10-CM

## 2021-03-05 DIAGNOSIS — I10 Essential (primary) hypertension: Secondary | ICD-10-CM

## 2021-03-05 DIAGNOSIS — Z9181 History of falling: Secondary | ICD-10-CM

## 2021-03-11 ENCOUNTER — Telehealth: Payer: Self-pay

## 2021-03-11 ENCOUNTER — Other Ambulatory Visit: Payer: Self-pay | Admitting: Family Medicine

## 2021-03-11 DIAGNOSIS — R351 Nocturia: Secondary | ICD-10-CM

## 2021-03-11 DIAGNOSIS — K219 Gastro-esophageal reflux disease without esophagitis: Secondary | ICD-10-CM

## 2021-03-11 DIAGNOSIS — N401 Enlarged prostate with lower urinary tract symptoms: Secondary | ICD-10-CM

## 2021-03-11 NOTE — Telephone Encounter (Signed)
Letter has been sent to patient informing them that their sleep study has expired. Patient will need to call and schedule an office visit to re-evaluate the need for a sleep study.    

## 2021-03-12 ENCOUNTER — Ambulatory Visit: Payer: Medicare Other | Admitting: Licensed Clinical Social Worker

## 2021-03-12 DIAGNOSIS — I209 Angina pectoris, unspecified: Secondary | ICD-10-CM

## 2021-03-12 DIAGNOSIS — I25118 Atherosclerotic heart disease of native coronary artery with other forms of angina pectoris: Secondary | ICD-10-CM

## 2021-03-12 DIAGNOSIS — R482 Apraxia: Secondary | ICD-10-CM

## 2021-03-12 DIAGNOSIS — I10 Essential (primary) hypertension: Secondary | ICD-10-CM

## 2021-03-12 DIAGNOSIS — F32A Depression, unspecified: Secondary | ICD-10-CM

## 2021-03-12 DIAGNOSIS — I252 Old myocardial infarction: Secondary | ICD-10-CM

## 2021-03-12 DIAGNOSIS — E785 Hyperlipidemia, unspecified: Secondary | ICD-10-CM

## 2021-03-12 DIAGNOSIS — Z550 Illiteracy and low-level literacy: Secondary | ICD-10-CM

## 2021-03-12 DIAGNOSIS — F411 Generalized anxiety disorder: Secondary | ICD-10-CM

## 2021-03-12 DIAGNOSIS — G809 Cerebral palsy, unspecified: Secondary | ICD-10-CM

## 2021-03-12 DIAGNOSIS — M5136 Other intervertebral disc degeneration, lumbar region: Secondary | ICD-10-CM

## 2021-03-12 DIAGNOSIS — E538 Deficiency of other specified B group vitamins: Secondary | ICD-10-CM

## 2021-03-12 DIAGNOSIS — J441 Chronic obstructive pulmonary disease with (acute) exacerbation: Secondary | ICD-10-CM

## 2021-03-12 NOTE — Patient Instructions (Addendum)
Visit Information  Patient Goals Manage emotions. Manage depression issues. Complete ADLs daily with assistance as needed.  Timeframe:  Short-Term Goal Priority:  Medium Progress: On Track Start Date:        03/12/21                 Expected End Date:       06/10/21             Follow Up Date 04/29/21 at 11:15 AM   Manage emotions. Manage depression issues . Complete ADLs daily with assistance as needed   Why is this important?   When you are stressed, down or upset, your body reacts too.  For example, your blood pressure may get higher; you may have a headache or stomachache.  When your emotions get the best of you, your body's ability to fight off cold and flu gets weak.  These steps will help you manage your emotions.     Patient Self Care Activities:  Self administers medications as prescribed Attends all scheduled provider appointments  Patient Coping Strengths:  Family Friends  Patient Self Care Deficits:  Mobility issues Depression issues Anxiety issues  Patient Goals:  - spend time or talk with others at least 2 to 3 times per week - practice relaxation or meditation daily - keep a calendar with appointment dates  Follow Up Plan: [name] to call client on  04/29/21 at 11:15 AM to assess client needs   Tahje Borawski.Jeffory Snelgrove MSW, [name] Licensed Clinical Social Worker [facility] Care Management [phone]

## 2021-03-12 NOTE — Chronic Care Management (AMB) (Signed)
Chronic Care Management    Clinical Social Work Note  03/12/2021 Name: [name] MRN: [id number] DOB: 01-21-1956  [name] is a 65 y.o. year old male who is a primary care patient of [name], [name], MD. The CCM team was consulted to assist the patient with chronic disease management and/or care coordination needs related to: [name] .   Engaged with patient by telephone for follow up visit in response to provider referral for social work chronic care management and care coordination services.   Consent to Services:  The patient was given information about Chronic Care Management services, agreed to services, and gave verbal consent prior to initiation of services.  Please see initial visit note for detailed documentation.   Patient agreed to services and consent obtained.   Assessment: Review of patient past medical history, allergies, medications, and health status, including review of relevant consultants reports was performed today as part of a comprehensive evaluation and provision of chronic care management and care coordination services.     SDOH (Social Determinants of Health) assessments and interventions performed:  SDOH Interventions    Flowsheet Row Most Recent Value  SDOH Interventions   Physical Activity Interventions Other (Comments)  [walking challenges]  Stress Interventions Provide Counseling  [client has stress over finances,  client has stress over managing medical needs]  Depression Interventions/Treatment  Currently on Treatment        Advanced Directives Status: See Vynca application for related entries.  CCM Care Plan  Allergies  Allergen Reactions   Chantix [Varenicline Tartrate] Nausea And Vomiting    Outpatient Encounter Medications as of 03/12/2021  Medication Sig   celecoxib (CELEBREX) 200 MG capsule TAKE (1) CAPSULE DAILY AS NEEDED.   acetaminophen (TYLENOL) 325 MG tablet Take 2 tablets (650 mg total) by mouth every 6 (six)  hours as needed for mild pain or headache.   albuterol (PROAIR HFA) 108 (90 Base) MCG/ACT inhaler 1-2 puffs every 6 hours as needed wheezing or shortness of breath.   aspirin EC 81 MG tablet Take 1 tablet (81 mg total) by mouth daily.   atorvastatin (LIPITOR) 40 MG tablet TAKE 1 TABLET ONCE DAILY AT 6PM   buPROPion (WELLBUTRIN SR) 150 MG 12 hr tablet Take 1 tablet (150 mg total) by mouth daily with breakfast.   cetaphil (CETAPHIL) lotion Apply 1 application topically 2 (two) times daily. For rash. Apply after washing with [facility] shower gel and rinsing with clear warm water.   DULoxetine (CYMBALTA) 60 MG capsule TAKE 1 CAPSULE ONCE DAILY WITH LARGEST MEAL   fexofenadine (ALLEGRA) 180 MG tablet Take 1 tablet (180 mg total) by mouth daily. For allergy symptoms   fluocinonide-emollient (LIDEX-E) 0.05 % cream Apply 1 application topically 2 (two) times daily. To affected areas   fluticasone (FLONASE) 50 MCG/ACT nasal spray Place 2 sprays into both nostrils daily as needed for allergies or rhinitis.   fluticasone furoate-vilanterol (BREO ELLIPTA) 100-25 MCG/INH AEPB Inhale 1 puff into the lungs daily.   Ipratropium-Albuterol (COMBIVENT RESPIMAT) 20-100 MCG/ACT AERS respimat Inhale 1 puff into the lungs every 6 (six) hours   isosorbide mononitrate (IMDUR) 60 MG 24 hr tablet Take 1 tablet (60 mg total) by mouth daily. (NEEDS TO BE SEEN BEFORE NEXT REFILL)   metoprolol tartrate (LOPRESSOR) 25 MG tablet TAKE (1) TABLET TWICE A DAY.   mirtazapine (REMERON) 15 MG tablet TAKE 1 TABLET AT BEDTIME AS NEEDED FOR SLEEP   nitroGLYCERIN (NITROSTAT) 0.4 MG SL tablet Place 1 tablet (0.4  mg total) under the tongue every 5 (five) minutes x 3 doses as needed for chest pain.   pantoprazole (PROTONIX) 40 MG tablet TAKE (1) TABLET TWICE A DAY.   tamsulosin (FLOMAX) 0.4 MG CAPS capsule TAKE (2) CAPSULES DAILY AFTER SUPPER.   Facility-Administered Encounter Medications as of 03/12/2021  Medication   cyanocobalamin  ((VITAMIN B-12)) injection 1,000 mcg    Patient Active Problem List   Diagnosis Date Noted   Back pain without sciatica 10/31/2020   Degenerative disc disease, lumbar 01/16/2019   Depression    Hypokalemia 06/27/2018   Seizure disorder ([place]) 02/07/2018   Apraxia of speech 10/13/2017   Left-sided weakness 10/13/2017   Coronary artery disease of native artery of native heart with stable angina pectoris ([place])    Illiteracy 10/23/2016   Carpal tunnel syndrome of right wrist 06/15/2016   GAD (generalized anxiety disorder) 05/01/2016   History of MI (myocardial infarction) 02/26/2015   Dyslipidemia, goal LDL below 70 10/08/2009   Infantile cerebral palsy ([place]) 10/08/2009   Essential hypertension 10/08/2009    Conditions to be addressed/monitored: monitor client management of depression issues; monitor client completion of ADLs  Care Plan : [name]  Updates made by [name], [name] since 03/12/2021 12:00 AM     Problem: Emotional Distress      Goal: Emotional Health Supported;Manage ADLs; Manage depression issues   Start Date: 03/12/2021  Expected End Date: 06/10/2021  This Visit's Progress: On track  Recent Progress: On track  Priority: Medium  Note:   Current Barriers:  Chronic Mental Health needs related to depression management and anxiety management Mobility issues Pain issues Illiteracy Suicidal Ideation/Homicidal Ideation: No  Clinical Social Work Goal(s):  patient will work with SW monthly by telephone or in person to reduce or manage symptoms related to depression and anxiety issues Patient will call RNCM or [name] as needed in next 30 days for CCM support Patient will communicate with [name] in next 30 days to discuss mobility issues of client and to discuss pain issues of client  Interventions: 1:1 collaboration with [name], MD regarding development and update of comprehensive plan of care as evidenced by provider attestation and  co-signature Discussed with client that his next B12 shot appointment at [facility] is on 03/18/21.   Informed client that [facility] will transport him to and from his 03/18/21 appointments at [facility] Discussed with client that [facility] application request for client had been completed by Dr. Livia Snellen and had been faxed recenlty to [facility] for consideration [name] called [facility] today and spoke with representative about PCS application for client.  Representative said that a nurse from [facility] would need to do assessment on client for consideration of PCS application [name] informed client of this information and gave client information related to calling [facility] to schedule nurse visit with client  Patient Self Care Activities:  Self administers medications as prescribed Attends all scheduled provider appointments Performs ADL's independently  Patient Coping Strengths:  Family  Patient Self Care Deficits:  Mobility issues Needs some help occasionally with in home care needs  Patient Goals:  - spend time or talk with others at least 2 to 3 times per week - practice relaxation or meditation daily - keep a calendar with appointment dates  Follow Up Plan: [name] to call client on 04/29/21 at  11:15 AM to assess needs of client. [name].Latravis Grine MSW, [name] Licensed Clinical Social  Worker [facility] Care Management [phone]

## 2021-03-18 ENCOUNTER — Other Ambulatory Visit: Payer: Self-pay

## 2021-03-18 ENCOUNTER — Ambulatory Visit (INDEPENDENT_AMBULATORY_CARE_PROVIDER_SITE_OTHER): Payer: Medicare Other

## 2021-03-18 DIAGNOSIS — E538 Deficiency of other specified B group vitamins: Secondary | ICD-10-CM | POA: Diagnosis not present

## 2021-03-18 NOTE — Progress Notes (Signed)
Cyanocobalamin injection given to right deltoid.  Patient tolerated well. 

## 2021-03-19 ENCOUNTER — Ambulatory Visit: Payer: Medicare Other | Admitting: Licensed Clinical Social Worker

## 2021-03-19 DIAGNOSIS — I10 Essential (primary) hypertension: Secondary | ICD-10-CM

## 2021-03-19 DIAGNOSIS — I25118 Atherosclerotic heart disease of native coronary artery with other forms of angina pectoris: Secondary | ICD-10-CM

## 2021-03-19 DIAGNOSIS — I209 Angina pectoris, unspecified: Secondary | ICD-10-CM

## 2021-03-19 DIAGNOSIS — E785 Hyperlipidemia, unspecified: Secondary | ICD-10-CM

## 2021-03-19 DIAGNOSIS — M51369 Other intervertebral disc degeneration, lumbar region without mention of lumbar back pain or lower extremity pain: Secondary | ICD-10-CM

## 2021-03-19 DIAGNOSIS — E538 Deficiency of other specified B group vitamins: Secondary | ICD-10-CM

## 2021-03-19 DIAGNOSIS — J441 Chronic obstructive pulmonary disease with (acute) exacerbation: Secondary | ICD-10-CM

## 2021-03-19 DIAGNOSIS — M5136 Other intervertebral disc degeneration, lumbar region: Secondary | ICD-10-CM

## 2021-03-19 DIAGNOSIS — F32A Depression, unspecified: Secondary | ICD-10-CM

## 2021-03-19 DIAGNOSIS — G809 Cerebral palsy, unspecified: Secondary | ICD-10-CM

## 2021-03-19 DIAGNOSIS — F411 Generalized anxiety disorder: Secondary | ICD-10-CM

## 2021-03-19 DIAGNOSIS — I252 Old myocardial infarction: Secondary | ICD-10-CM

## 2021-03-19 DIAGNOSIS — Z550 Illiteracy and low-level literacy: Secondary | ICD-10-CM

## 2021-03-19 DIAGNOSIS — R482 Apraxia: Secondary | ICD-10-CM

## 2021-03-19 NOTE — Chronic Care Management (AMB) (Signed)
Chronic Care Management    Clinical Social Work Note  03/19/2021 Name: [name] MRN: [id number] DOB: July 07, 1955  [name] is a 65 y.o. year old male who is a primary care patient of [name], [name], MD. The CCM team was consulted to assist the patient with chronic disease management and/or care coordination needs related to: [name] .   Engaged with patient by telephone for follow up visit in response to provider referral for social work chronic care management and care coordination services.   Consent to Services:  The patient was given information about Chronic Care Management services, agreed to services, and gave verbal consent prior to initiation of services.  Please see initial visit note for detailed documentation.   Patient agreed to services and consent obtained.   Assessment: Review of patient past medical history, allergies, medications, and health status, including review of relevant consultants reports was performed today as part of a comprehensive evaluation and provision of chronic care management and care coordination services.     SDOH (Social Determinants of Health) assessments and interventions performed:  SDOH Interventions    Flowsheet Row Most Recent Value  SDOH Interventions   Physical Activity Interventions Other (Comments)  [walking challenges. uses a cane to help him walk]  Stress Interventions Provide Counseling  [has stress related to managing medical needs]  Depression Interventions/Treatment  Currently on Treatment        Advanced Directives Status: See Vynca application for related entries.  CCM Care Plan  Allergies  Allergen Reactions   Chantix [Varenicline Tartrate] Nausea And Vomiting    Outpatient Encounter Medications as of 03/19/2021  Medication Sig   celecoxib (CELEBREX) 200 MG capsule TAKE (1) CAPSULE DAILY AS NEEDED.   acetaminophen (TYLENOL) 325 MG tablet Take 2 tablets (650 mg total) by mouth every 6 (six)  hours as needed for mild pain or headache.   albuterol (PROAIR HFA) 108 (90 Base) MCG/ACT inhaler 1-2 puffs every 6 hours as needed wheezing or shortness of breath.   aspirin EC 81 MG tablet Take 1 tablet (81 mg total) by mouth daily.   atorvastatin (LIPITOR) 40 MG tablet TAKE 1 TABLET ONCE DAILY AT 6PM   buPROPion (WELLBUTRIN SR) 150 MG 12 hr tablet Take 1 tablet (150 mg total) by mouth daily with breakfast.   cetaphil (CETAPHIL) lotion Apply 1 application topically 2 (two) times daily. For rash. Apply after washing with [facility] shower gel and rinsing with clear warm water.   DULoxetine (CYMBALTA) 60 MG capsule TAKE 1 CAPSULE ONCE DAILY WITH LARGEST MEAL   fexofenadine (ALLEGRA) 180 MG tablet Take 1 tablet (180 mg total) by mouth daily. For allergy symptoms   fluocinonide-emollient (LIDEX-E) 0.05 % cream Apply 1 application topically 2 (two) times daily. To affected areas   fluticasone (FLONASE) 50 MCG/ACT nasal spray Place 2 sprays into both nostrils daily as needed for allergies or rhinitis.   fluticasone furoate-vilanterol (BREO ELLIPTA) 100-25 MCG/INH AEPB Inhale 1 puff into the lungs daily.   Ipratropium-Albuterol (COMBIVENT RESPIMAT) 20-100 MCG/ACT AERS respimat Inhale 1 puff into the lungs every 6 (six) hours   isosorbide mononitrate (IMDUR) 60 MG 24 hr tablet Take 1 tablet (60 mg total) by mouth daily. (NEEDS TO BE SEEN BEFORE NEXT REFILL)   metoprolol tartrate (LOPRESSOR) 25 MG tablet TAKE (1) TABLET TWICE A DAY.   mirtazapine (REMERON) 15 MG tablet TAKE 1 TABLET AT BEDTIME AS NEEDED FOR SLEEP   nitroGLYCERIN (NITROSTAT) 0.4 MG SL tablet Place 1 tablet (  0.4 mg total) under the tongue every 5 (five) minutes x 3 doses as needed for chest pain.   pantoprazole (PROTONIX) 40 MG tablet TAKE (1) TABLET TWICE A DAY.   tamsulosin (FLOMAX) 0.4 MG CAPS capsule TAKE (2) CAPSULES DAILY AFTER SUPPER.   Facility-Administered Encounter Medications as of 03/19/2021  Medication   cyanocobalamin  ((VITAMIN B-12)) injection 1,000 mcg    Patient Active Problem List   Diagnosis Date Noted   Back pain without sciatica 10/31/2020   Degenerative disc disease, lumbar 01/16/2019   Depression    Hypokalemia 06/27/2018   Seizure disorder ([place]) 02/07/2018   Apraxia of speech 10/13/2017   Left-sided weakness 10/13/2017   Coronary artery disease of native artery of native heart with stable angina pectoris ([place])    Illiteracy 10/23/2016   Carpal tunnel syndrome of right wrist 06/15/2016   GAD (generalized anxiety disorder) 05/01/2016   History of MI (myocardial infarction) 02/26/2015   Dyslipidemia, goal LDL below 70 10/08/2009   Infantile cerebral palsy ([place]) 10/08/2009   Essential hypertension 10/08/2009    Conditions to be addressed/monitored: monitor client management of depression issues  Care Plan : [name]  Updates made by [name], [name] since 03/19/2021 12:00 AM     Problem: Emotional Distress      Goal: Emotional Health Supported;Manage ADLs; Manage depression issues   Start Date: 03/19/2021  Expected End Date: 06/16/2021  This Visit's Progress: On track  Recent Progress: On track  Priority: Medium  Note:   Current Barriers:  Chronic Mental Health needs related to depression management and anxiety management Mobility issues Pain issues Illiteracy Suicidal Ideation/Homicidal Ideation: No  Clinical Social Work Goal(s):  patient will work with SW monthly by telephone or in person to reduce or manage symptoms related to depression and anxiety issues Patient will call RNCM or [name] as needed in next 30 days for CCM support Patient will communicate with [name] in next 30 days to discuss mobility issues of client and to discuss pain issues of client  Interventions: 1:1 collaboration with [name], MD regarding development and update of comprehensive plan of care as evidenced by provider attestation and co-signature Discussed with client the nurse  home visit he had yesterday with RN from [facility] Discussed PCS application for client Discussed pain issues of client and dental needs of client. He said he has one loose tooth and would like to have a dental exam to address his dental needs Collaborated with RNCM [name] today to discuss dental needs of client   Patient Self Care Activities:  Self administers medications as prescribed Attends all scheduled provider appointments Performs ADL's independently  Patient Coping Strengths:  Family  Patient Self Care Deficits:  Mobility issues Needs some help occasionally with in home care needs  Patient Goals:  - spend time or talk with others at least 2 to 3 times per week - practice relaxation or meditation daily - keep a calendar with appointment dates  Follow Up Plan: [name] to call client on 04/29/21 at  11:15 AM to assess needs of client. [name].Smaran Gaus MSW, [name] Licensed Clinical Social Worker [facility] Care Management [phone]

## 2021-03-19 NOTE — Patient Instructions (Addendum)
Visit Information  Patient goals:  Manage emotions. Manage depression issues. Complete ADLs daily with assistance as needed  Timeframe:  Short-Term Goal Priority:  Medium Progress: On Track Start Date:        03/19/21                 Expected End Date:       06/16/21             Follow Up Date 04/29/21 at 11:15 AM   Manage emotions. Manage depression issues . Complete ADLs daily with assistance as needed   Why is this important?   When you are stressed, down or upset, your body reacts too.  For example, your blood pressure may get higher; you may have a headache or stomachache.  When your emotions get the best of you, your body's ability to fight off cold and flu gets weak.  These steps will help you manage your emotions.     Patient Self Care Activities:  Self administers medications as prescribed Attends all scheduled provider appointments  Patient Coping Strengths:  Family Friends  Patient Self Care Deficits:  Mobility issues Depression issues Anxiety issues  Patient Goals:  - spend time or talk with others at least 2 to 3 times per week - practice relaxation or meditation daily - keep a calendar with appointment dates  Follow Up Plan: [name] to call client on  04/29/21 at 11:15 AM to assess client needs   [name].Amandy Chubbuck MSW, [name] Licensed Clinical Social Worker [facility] Care Management [phone]

## 2021-03-20 ENCOUNTER — Telehealth: Payer: Self-pay | Admitting: Family Medicine

## 2021-03-20 ENCOUNTER — Ambulatory Visit (INDEPENDENT_AMBULATORY_CARE_PROVIDER_SITE_OTHER): Payer: Medicare Other | Admitting: Licensed Clinical Social Worker

## 2021-03-20 DIAGNOSIS — M5136 Other intervertebral disc degeneration, lumbar region: Secondary | ICD-10-CM

## 2021-03-20 DIAGNOSIS — F32A Depression, unspecified: Secondary | ICD-10-CM

## 2021-03-20 DIAGNOSIS — E785 Hyperlipidemia, unspecified: Secondary | ICD-10-CM

## 2021-03-20 DIAGNOSIS — I25118 Atherosclerotic heart disease of native coronary artery with other forms of angina pectoris: Secondary | ICD-10-CM

## 2021-03-20 DIAGNOSIS — I209 Angina pectoris, unspecified: Secondary | ICD-10-CM

## 2021-03-20 DIAGNOSIS — E538 Deficiency of other specified B group vitamins: Secondary | ICD-10-CM

## 2021-03-20 DIAGNOSIS — I252 Old myocardial infarction: Secondary | ICD-10-CM

## 2021-03-20 DIAGNOSIS — R482 Apraxia: Secondary | ICD-10-CM

## 2021-03-20 DIAGNOSIS — M51369 Other intervertebral disc degeneration, lumbar region without mention of lumbar back pain or lower extremity pain: Secondary | ICD-10-CM

## 2021-03-20 DIAGNOSIS — G809 Cerebral palsy, unspecified: Secondary | ICD-10-CM

## 2021-03-20 DIAGNOSIS — J441 Chronic obstructive pulmonary disease with (acute) exacerbation: Secondary | ICD-10-CM

## 2021-03-20 DIAGNOSIS — F411 Generalized anxiety disorder: Secondary | ICD-10-CM

## 2021-03-20 DIAGNOSIS — I10 Essential (primary) hypertension: Secondary | ICD-10-CM

## 2021-03-20 DIAGNOSIS — Z550 Illiteracy and low-level literacy: Secondary | ICD-10-CM

## 2021-03-20 NOTE — Telephone Encounter (Signed)
Pt would like to talk to someone about getting on the CAP program. Please call back and advise.

## 2021-03-20 NOTE — Chronic Care Management (AMB) (Signed)
Chronic Care Management    Clinical Social Work Note  03/20/2021 Name: [name] MRN: [id number] DOB: 1955/05/31  [name] is a 65 y.o. year old male who is a primary care patient of [name], [name], MD. The CCM team was consulted to assist the patient with chronic disease management and/or care coordination needs related to: [name] .   Engaged with patient by telephone for follow up visit in response to provider referral for social work chronic care management and care coordination services.   Consent to Services:  The patient was given information about Chronic Care Management services, agreed to services, and gave verbal consent prior to initiation of services.  Please see initial visit note for detailed documentation.   Patient agreed to services and consent obtained.   Assessment: Review of patient past medical history, allergies, medications, and health status, including review of relevant consultants reports was performed today as part of a comprehensive evaluation and provision of chronic care management and care coordination services.     SDOH (Social Determinants of Health) assessments and interventions performed:  SDOH Interventions    Flowsheet Row Most Recent Value  SDOH Interventions   Physical Activity Interventions Other (Comments)  [walking challenges. uses a cane to help him walk]  Stress Interventions Provide Counseling  [has stress related to managing in home care needs]  Depression Interventions/Treatment  Currently on Treatment        Advanced Directives Status: See Vynca application for related entries.  CCM Care Plan  Allergies  Allergen Reactions   Chantix [Varenicline Tartrate] Nausea And Vomiting    Outpatient Encounter Medications as of 03/20/2021  Medication Sig   celecoxib (CELEBREX) 200 MG capsule TAKE (1) CAPSULE DAILY AS NEEDED.   acetaminophen (TYLENOL) 325 MG tablet Take 2 tablets (650 mg total) by mouth every 6 (six)  hours as needed for mild pain or headache.   albuterol (PROAIR HFA) 108 (90 Base) MCG/ACT inhaler 1-2 puffs every 6 hours as needed wheezing or shortness of breath.   aspirin EC 81 MG tablet Take 1 tablet (81 mg total) by mouth daily.   atorvastatin (LIPITOR) 40 MG tablet TAKE 1 TABLET ONCE DAILY AT 6PM   buPROPion (WELLBUTRIN SR) 150 MG 12 hr tablet Take 1 tablet (150 mg total) by mouth daily with breakfast.   cetaphil (CETAPHIL) lotion Apply 1 application topically 2 (two) times daily. For rash. Apply after washing with [facility] shower gel and rinsing with clear warm water.   DULoxetine (CYMBALTA) 60 MG capsule TAKE 1 CAPSULE ONCE DAILY WITH LARGEST MEAL   fexofenadine (ALLEGRA) 180 MG tablet Take 1 tablet (180 mg total) by mouth daily. For allergy symptoms   fluocinonide-emollient (LIDEX-E) 0.05 % cream Apply 1 application topically 2 (two) times daily. To affected areas   fluticasone (FLONASE) 50 MCG/ACT nasal spray Place 2 sprays into both nostrils daily as needed for allergies or rhinitis.   fluticasone furoate-vilanterol (BREO ELLIPTA) 100-25 MCG/INH AEPB Inhale 1 puff into the lungs daily.   Ipratropium-Albuterol (COMBIVENT RESPIMAT) 20-100 MCG/ACT AERS respimat Inhale 1 puff into the lungs every 6 (six) hours   isosorbide mononitrate (IMDUR) 60 MG 24 hr tablet Take 1 tablet (60 mg total) by mouth daily. (NEEDS TO BE SEEN BEFORE NEXT REFILL)   metoprolol tartrate (LOPRESSOR) 25 MG tablet TAKE (1) TABLET TWICE A DAY.   mirtazapine (REMERON) 15 MG tablet TAKE 1 TABLET AT BEDTIME AS NEEDED FOR SLEEP   nitroGLYCERIN (NITROSTAT) 0.4 MG SL tablet Place  1 tablet (0.4 mg total) under the tongue every 5 (five) minutes x 3 doses as needed for chest pain.   pantoprazole (PROTONIX) 40 MG tablet TAKE (1) TABLET TWICE A DAY.   tamsulosin (FLOMAX) 0.4 MG CAPS capsule TAKE (2) CAPSULES DAILY AFTER SUPPER.   Facility-Administered Encounter Medications as of 03/20/2021  Medication   cyanocobalamin  ((VITAMIN B-12)) injection 1,000 mcg    Patient Active Problem List   Diagnosis Date Noted   Back pain without sciatica 10/31/2020   Degenerative disc disease, lumbar 01/16/2019   Depression    Hypokalemia 06/27/2018   Seizure disorder ([place]) 02/07/2018   Apraxia of speech 10/13/2017   Left-sided weakness 10/13/2017   Coronary artery disease of native artery of native heart with stable angina pectoris ([place])    Illiteracy 10/23/2016   Carpal tunnel syndrome of right wrist 06/15/2016   GAD (generalized anxiety disorder) 05/01/2016   History of MI (myocardial infarction) 02/26/2015   Dyslipidemia, goal LDL below 70 10/08/2009   Infantile cerebral palsy ([place]) 10/08/2009   Essential hypertension 10/08/2009   Conditions to be addressed/monitored: monitor client management of depression issues and of anxiety issues  Care Plan : [name]  Updates made by [name], [name] since 03/20/2021 12:00 AM     Problem: Emotional Distress      Goal: Emotional Health Supported;Manage ADLs; Manage depression issues   Start Date: 03/20/2021  Expected End Date: 06/16/2021  This Visit's Progress: On track  Recent Progress: On track  Priority: Medium  Note:   Current Barriers:  Chronic Mental Health needs related to depression management and anxiety management Mobility issues Pain issues Illiteracy Suicidal Ideation/Homicidal Ideation: No  Clinical Social Work Goal(s):  patient will work with SW monthly by telephone or in person to reduce or manage symptoms related to depression and anxiety issues Patient will call RNCM or [name] as needed in next 30 days for CCM support Patient will communicate with [name] in next 30 days to discuss mobility issues of client and to discuss pain issues of client  Interventions: 1:1 collaboration with [name], MD regarding development and update of comprehensive plan of care as evidenced by provider attestation and co-signature Discussed with  client recent RN home visit with RN from [facility] has not received a letter from [facility] recently regarding his latest RN home visit and request for in home hours increase Client is anxious, calling [name] 3 times weekly to inquire about his needs.  He has support from [facility] staff, has support from [facility], has support with [name] for transport, and has support of [name] [name] called [facility] 2 times today but [name] was not able to speak with representative of [facility] via phone today Provided counseling support for client   Patient Self Care Activities:  Self administers medications as prescribed Attends all scheduled provider appointments Performs ADL's independently  Patient Coping Strengths:  Family  Patient Self Care Deficits:  Mobility issues Needs some help occasionally with in home care needs  Patient Goals:  - spend time or talk with others at least 2 to 3 times per week - practice relaxation or meditation daily - keep a calendar with appointment dates  Follow Up Plan: [name] to call client on 04/29/21 at  11:15 AM to assess needs of client. [name].Kimberly Nieland MSW, [name] Licensed Clinical Social Worker [facility] Care Management [phone]

## 2021-03-20 NOTE — Telephone Encounter (Signed)
Gave pt the phone # to [facility] in [place] Pt also needed name & number of oral surgeon that he saw couple years ago, gave him info from referral to Dr. Costella Hatcher in [place] to [place] 7 Maxillofacial Surgery [phone]

## 2021-03-20 NOTE — Patient Instructions (Addendum)
Visit Information  Patient Goals:  Manage emotions. Manage depression issues. Complete ADLs daily with  assistance as needed.   Timeframe:  Short-Term Goal Priority:  Medium Progress: On Track Start Date:        03/20/21                 Expected End Date:       06/16/21             Follow Up Date 04/29/21 at 11:15 AM   Manage emotions. Manage depression issues . Complete ADLs daily with assistance as needed   Why is this important?   When you are stressed, down or upset, your body reacts too.  For example, your blood pressure may get higher; you may have a headache or stomachache.  When your emotions get the best of you, your body's ability to fight off cold and flu gets weak.  These steps will help you manage your emotions.     Patient Self Care Activities:  Self administers medications as prescribed Attends all scheduled provider appointments  Patient Coping Strengths:  Family Friends  Patient Self Care Deficits:  Mobility issues Depression issues Anxiety issues  Patient Goals:  - spend time or talk with others at least 2 to 3 times per week - practice relaxation or meditation daily - keep a calendar with appointment dates  Follow Up Plan: [name] to call client on  04/29/21 at 11:15 AM to assess client needs   Brailyn Delman.Anastazia Creek MSW, [name] Licensed Clinical Social Worker [facility] Care Management [phone]

## 2021-03-21 ENCOUNTER — Ambulatory Visit: Payer: Medicare Other | Admitting: Licensed Clinical Social Worker

## 2021-03-21 DIAGNOSIS — R482 Apraxia: Secondary | ICD-10-CM

## 2021-03-21 DIAGNOSIS — F411 Generalized anxiety disorder: Secondary | ICD-10-CM

## 2021-03-21 DIAGNOSIS — I252 Old myocardial infarction: Secondary | ICD-10-CM

## 2021-03-21 DIAGNOSIS — E785 Hyperlipidemia, unspecified: Secondary | ICD-10-CM

## 2021-03-21 DIAGNOSIS — I209 Angina pectoris, unspecified: Secondary | ICD-10-CM

## 2021-03-21 DIAGNOSIS — I25118 Atherosclerotic heart disease of native coronary artery with other forms of angina pectoris: Secondary | ICD-10-CM

## 2021-03-21 DIAGNOSIS — F32A Depression, unspecified: Secondary | ICD-10-CM

## 2021-03-21 DIAGNOSIS — M5136 Other intervertebral disc degeneration, lumbar region: Secondary | ICD-10-CM

## 2021-03-21 DIAGNOSIS — G809 Cerebral palsy, unspecified: Secondary | ICD-10-CM

## 2021-03-21 DIAGNOSIS — I10 Essential (primary) hypertension: Secondary | ICD-10-CM

## 2021-03-21 DIAGNOSIS — Z550 Illiteracy and low-level literacy: Secondary | ICD-10-CM

## 2021-03-21 DIAGNOSIS — E538 Deficiency of other specified B group vitamins: Secondary | ICD-10-CM

## 2021-03-21 DIAGNOSIS — J441 Chronic obstructive pulmonary disease with (acute) exacerbation: Secondary | ICD-10-CM

## 2021-03-21 DIAGNOSIS — M51369 Other intervertebral disc degeneration, lumbar region without mention of lumbar back pain or lower extremity pain: Secondary | ICD-10-CM

## 2021-03-21 NOTE — Patient Instructions (Addendum)
Visit Information  Patient Goals:  Manage emotions. Manage Depression issues. Complete ADLs daily with assistance as needed  Timeframe:  Short-Term Goal Priority:  Medium Progress: On Track Start Date:        03/21/21                 Expected End Date:       06/16/21             Follow Up Date 04/29/21 at 11:15 AM   Manage emotions. Manage depression issues . Complete ADLs daily with assistance as needed   Why is this important?   When you are stressed, down or upset, your body reacts too.  For example, your blood pressure may get higher; you may have a headache or stomachache.  When your emotions get the best of you, your body's ability to fight off cold and flu gets weak.  These steps will help you manage your emotions.     Patient Self Care Activities:  Self administers medications as prescribed Attends all scheduled provider appointments  Patient Coping Strengths:  Family Friends  Patient Self Care Deficits:  Mobility issues Depression issues Anxiety issues  Patient Goals:  - spend time or talk with others at least 2 to 3 times per week - practice relaxation or meditation daily - keep a calendar with appointment dates  Follow Up Plan: [name] to call client on  04/29/21 at 11:15 AM to assess client needs   Yvon Mccord.Tonika Eden MSW, [name] Licensed Clinical Social Worker [facility] Care Management [phone]

## 2021-03-21 NOTE — Chronic Care Management (AMB) (Signed)
Chronic Care Management    Clinical Social Work Note  03/21/2021 Name: [name] MRN: [id number] DOB: 1955/07/11  [name] is a 65 y.o. year old male who is a primary care patient of [name], [name], MD. The CCM team was consulted to assist the patient with chronic disease management and/or care coordination needs related to: [name] .   Engaged with patient by telephone for follow up visit in response to provider referral for social work chronic care management and care coordination services.   Consent to Services:  The patient was given information about Chronic Care Management services, agreed to services, and gave verbal consent prior to initiation of services.  Please see initial visit note for detailed documentation.   Patient agreed to services and consent obtained.   Assessment: Review of patient past medical history, allergies, medications, and health status, including review of relevant consultants reports was performed today as part of a comprehensive evaluation and provision of chronic care management and care coordination services.     SDOH (Social Determinants of Health) assessments and interventions performed:  SDOH Interventions    Flowsheet Row Most Recent Value  SDOH Interventions   Physical Activity Interventions Other (Comments)  [walking challenges. uses a cane to help him walk]  Stress Interventions Provide Counseling  [client has stress related to home care assistance. client has stress related to managing medical needs]  Depression Interventions/Treatment  Currently on Treatment        Advanced Directives Status: See Vynca application for related entries.  CCM Care Plan  Allergies  Allergen Reactions   Chantix [Varenicline Tartrate] Nausea And Vomiting    Outpatient Encounter Medications as of 03/21/2021  Medication Sig   celecoxib (CELEBREX) 200 MG capsule TAKE (1) CAPSULE DAILY AS NEEDED.   acetaminophen (TYLENOL) 325 MG tablet  Take 2 tablets (650 mg total) by mouth every 6 (six) hours as needed for mild pain or headache.   albuterol (PROAIR HFA) 108 (90 Base) MCG/ACT inhaler 1-2 puffs every 6 hours as needed wheezing or shortness of breath.   aspirin EC 81 MG tablet Take 1 tablet (81 mg total) by mouth daily.   atorvastatin (LIPITOR) 40 MG tablet TAKE 1 TABLET ONCE DAILY AT 6PM   buPROPion (WELLBUTRIN SR) 150 MG 12 hr tablet Take 1 tablet (150 mg total) by mouth daily with breakfast.   cetaphil (CETAPHIL) lotion Apply 1 application topically 2 (two) times daily. For rash. Apply after washing with [facility] shower gel and rinsing with clear warm water.   DULoxetine (CYMBALTA) 60 MG capsule TAKE 1 CAPSULE ONCE DAILY WITH LARGEST MEAL   fexofenadine (ALLEGRA) 180 MG tablet Take 1 tablet (180 mg total) by mouth daily. For allergy symptoms   fluocinonide-emollient (LIDEX-E) 0.05 % cream Apply 1 application topically 2 (two) times daily. To affected areas   fluticasone (FLONASE) 50 MCG/ACT nasal spray Place 2 sprays into both nostrils daily as needed for allergies or rhinitis.   fluticasone furoate-vilanterol (BREO ELLIPTA) 100-25 MCG/INH AEPB Inhale 1 puff into the lungs daily.   Ipratropium-Albuterol (COMBIVENT RESPIMAT) 20-100 MCG/ACT AERS respimat Inhale 1 puff into the lungs every 6 (six) hours   isosorbide mononitrate (IMDUR) 60 MG 24 hr tablet Take 1 tablet (60 mg total) by mouth daily. (NEEDS TO BE SEEN BEFORE NEXT REFILL)   metoprolol tartrate (LOPRESSOR) 25 MG tablet TAKE (1) TABLET TWICE A DAY.   mirtazapine (REMERON) 15 MG tablet TAKE 1 TABLET AT BEDTIME AS NEEDED FOR SLEEP  nitroGLYCERIN (NITROSTAT) 0.4 MG SL tablet Place 1 tablet (0.4 mg total) under the tongue every 5 (five) minutes x 3 doses as needed for chest pain.   pantoprazole (PROTONIX) 40 MG tablet TAKE (1) TABLET TWICE A DAY.   tamsulosin (FLOMAX) 0.4 MG CAPS capsule TAKE (2) CAPSULES DAILY AFTER SUPPER.   Facility-Administered Encounter  Medications as of 03/21/2021  Medication   cyanocobalamin ((VITAMIN B-12)) injection 1,000 mcg    Patient Active Problem List   Diagnosis Date Noted   Back pain without sciatica 10/31/2020   Degenerative disc disease, lumbar 01/16/2019   Depression    Hypokalemia 06/27/2018   Seizure disorder ([place]) 02/07/2018   Apraxia of speech 10/13/2017   Left-sided weakness 10/13/2017   Coronary artery disease of native artery of native heart with stable angina pectoris ([place])    Illiteracy 10/23/2016   Carpal tunnel syndrome of right wrist 06/15/2016   GAD (generalized anxiety disorder) 05/01/2016   History of MI (myocardial infarction) 02/26/2015   Dyslipidemia, goal LDL below 70 10/08/2009   Infantile cerebral palsy ([place]) 10/08/2009   Essential hypertension 10/08/2009    Conditions to be addressed/monitored: monitor client management of anxiety issues and of depression issues  Care Plan : [name]  Updates made by [name], [name] since 03/21/2021 12:00 AM     Problem: Emotional Distress      Goal: Emotional Health Supported;Manage ADLs; Manage depression issues   Start Date: 03/21/2021  Expected End Date: 06/16/2021  This Visit's Progress: On track  Recent Progress: On track  Priority: Medium  Note:   Current Barriers:  Chronic Mental Health needs related to depression management and anxiety management Mobility issues Pain issues Illiteracy Suicidal Ideation/Homicidal Ideation: No  Clinical Social Work Goal(s):  patient will work with SW monthly by telephone or in person to reduce or manage symptoms related to depression and anxiety issues Patient will call RNCM or [name] as needed in next 30 days for CCM support Patient will communicate with [name] in next 30 days to discuss mobility issues of client and to discuss pain issues of client  Interventions: 1:1 collaboration with [name], MD regarding development and update of comprehensive plan of care as  evidenced by provider attestation and co-signature Discussed with client recent RN home visit with RN from [place] and [name] spoke of CAP services potentially for client. Client said he called CAP services representative today and talked briefly with CAP representative about his needs. CAP representative said that client would be mailed documents to his home for client to take to PCP office for completion for client Client spoke of dental needs.  [name] has communicated with RNCM about dental needs of client.  Client did not know name of dentist he saw in the past but client said he saw a "[facility] in [place], [place]".. [name] will talk further with RNCM about this information given by client related to dentist he had seen. Provided counseling support for client. Client is anxious about home health service. He already receives [facility] services weekly. 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  Patient Self Care Activities:  Self administers medications as prescribed Attends all scheduled provider appointments Performs ADL's independently  Patient Coping Strengths:  Family  Patient Self Care Deficits:  Mobility issues Needs some help occasionally with in home care needs  Patient Goals:  - spend time or talk with others  at least 2 to 3 times per week - practice relaxation or meditation daily - keep a calendar with appointment dates  Follow Up Plan: [name] to call client on 04/29/21 at  11:15 AM to assess needs of client. [name].Tata Timmins MSW, [name] Licensed Clinical Social Worker [facility] Care Management [phone]

## 2021-04-01 ENCOUNTER — Ambulatory Visit: Payer: Medicare Other | Admitting: Licensed Clinical Social Worker

## 2021-04-01 DIAGNOSIS — Z550 Illiteracy and low-level literacy: Secondary | ICD-10-CM

## 2021-04-01 DIAGNOSIS — F411 Generalized anxiety disorder: Secondary | ICD-10-CM

## 2021-04-01 DIAGNOSIS — I10 Essential (primary) hypertension: Secondary | ICD-10-CM

## 2021-04-01 DIAGNOSIS — G809 Cerebral palsy, unspecified: Secondary | ICD-10-CM

## 2021-04-01 DIAGNOSIS — F32A Depression, unspecified: Secondary | ICD-10-CM

## 2021-04-01 DIAGNOSIS — M5136 Other intervertebral disc degeneration, lumbar region: Secondary | ICD-10-CM

## 2021-04-01 DIAGNOSIS — E538 Deficiency of other specified B group vitamins: Secondary | ICD-10-CM

## 2021-04-01 DIAGNOSIS — I252 Old myocardial infarction: Secondary | ICD-10-CM

## 2021-04-01 DIAGNOSIS — I25118 Atherosclerotic heart disease of native coronary artery with other forms of angina pectoris: Secondary | ICD-10-CM

## 2021-04-01 DIAGNOSIS — R482 Apraxia: Secondary | ICD-10-CM

## 2021-04-01 DIAGNOSIS — I209 Angina pectoris, unspecified: Secondary | ICD-10-CM

## 2021-04-01 DIAGNOSIS — E785 Hyperlipidemia, unspecified: Secondary | ICD-10-CM

## 2021-04-01 DIAGNOSIS — J441 Chronic obstructive pulmonary disease with (acute) exacerbation: Secondary | ICD-10-CM

## 2021-04-01 NOTE — Patient Instructions (Addendum)
Visit Information  Patient Goals: Manage emotions. Manage Depression issues. Complete ADLs daily with assistance as needed  Timeframe:  Short-Term Goal Priority:  Medium Progress: On Track Start Date:        04/01/21                 Expected End Date:       06/24/21             Follow Up Date  04/29/21 at 11:15 AM   Manage emotions. Manage depression issues . Complete ADLs daily with assistance as needed   Why is this important?   When you are stressed, down or upset, your body reacts too.  For example, your blood pressure may get higher; you may have a headache or stomachache.  When your emotions get the best of you, your body's ability to fight off cold and flu gets weak.  These steps will help you manage your emotions.     Patient Self Care Activities:  Self administers medications as prescribed Attends all scheduled provider appointments  Patient Coping Strengths:  Family Friends  Patient Self Care Deficits:  Mobility issues Depression issues Anxiety issues  Patient Goals:  - spend time or talk with others at least 2 to 3 times per week - practice relaxation or meditation daily - keep a calendar with appointment dates  Follow Up Plan: [name] to call client on  04/29/21 at 11:15 AM to assess client needs   [name].Taeko Schaffer MSW, [name] Licensed Clinical Social Worker [facility] Care Management [phone]

## 2021-04-01 NOTE — Chronic Care Management (AMB) (Signed)
Chronic Care Management    Clinical Social Work Note  04/01/2021 Name: [name] MRN: [id number] DOB: 1955/05/15  [name] is a 65 y.o. year old male who is a primary care patient of [name], [name], MD. The CCM team was consulted to assist the patient with chronic disease management and/or care coordination needs related to: [name] .   Engaged with patient by telephone for follow up visit in response to provider referral for social work chronic care management and care coordination services.   Consent to Services:  The patient was given information about Chronic Care Management services, agreed to services, and gave verbal consent prior to initiation of services.  Please see initial visit note for detailed documentation.   Patient agreed to services and consent obtained.   Assessment: Review of patient past medical history, allergies, medications, and health status, including review of relevant consultants reports was performed today as part of a comprehensive evaluation and provision of chronic care management and care coordination services.     SDOH (Social Determinants of Health) assessments and interventions performed:  SDOH Interventions    Flowsheet Row Most Recent Value  SDOH Interventions   Physical Activity Interventions Other (Comments)  [walking challenges, he uses a cane to help him walk]  Stress Interventions Provide Counseling  [client has stress related to managing medical needs]  Depression Interventions/Treatment  Currently on Treatment        Advanced Directives Status: See Vynca application for related entries.  CCM Care Plan  Allergies  Allergen Reactions   Chantix [Varenicline Tartrate] Nausea And Vomiting    Outpatient Encounter Medications as of 04/01/2021  Medication Sig   celecoxib (CELEBREX) 200 MG capsule TAKE (1) CAPSULE DAILY AS NEEDED.   acetaminophen (TYLENOL) 325 MG tablet Take 2 tablets (650 mg total) by mouth every 6  (six) hours as needed for mild pain or headache.   albuterol (PROAIR HFA) 108 (90 Base) MCG/ACT inhaler 1-2 puffs every 6 hours as needed wheezing or shortness of breath.   aspirin EC 81 MG tablet Take 1 tablet (81 mg total) by mouth daily.   atorvastatin (LIPITOR) 40 MG tablet TAKE 1 TABLET ONCE DAILY AT 6PM   buPROPion (WELLBUTRIN SR) 150 MG 12 hr tablet Take 1 tablet (150 mg total) by mouth daily with breakfast.   cetaphil (CETAPHIL) lotion Apply 1 application topically 2 (two) times daily. For rash. Apply after washing with [facility] shower gel and rinsing with clear warm water.   DULoxetine (CYMBALTA) 60 MG capsule TAKE 1 CAPSULE ONCE DAILY WITH LARGEST MEAL   fexofenadine (ALLEGRA) 180 MG tablet Take 1 tablet (180 mg total) by mouth daily. For allergy symptoms   fluocinonide-emollient (LIDEX-E) 0.05 % cream Apply 1 application topically 2 (two) times daily. To affected areas   fluticasone (FLONASE) 50 MCG/ACT nasal spray Place 2 sprays into both nostrils daily as needed for allergies or rhinitis.   fluticasone furoate-vilanterol (BREO ELLIPTA) 100-25 MCG/INH AEPB Inhale 1 puff into the lungs daily.   Ipratropium-Albuterol (COMBIVENT RESPIMAT) 20-100 MCG/ACT AERS respimat Inhale 1 puff into the lungs every 6 (six) hours   isosorbide mononitrate (IMDUR) 60 MG 24 hr tablet Take 1 tablet (60 mg total) by mouth daily. (NEEDS TO BE SEEN BEFORE NEXT REFILL)   metoprolol tartrate (LOPRESSOR) 25 MG tablet TAKE (1) TABLET TWICE A DAY.   mirtazapine (REMERON) 15 MG tablet TAKE 1 TABLET AT BEDTIME AS NEEDED FOR SLEEP   nitroGLYCERIN (NITROSTAT) 0.4 MG SL tablet Place  1 tablet (0.4 mg total) under the tongue every 5 (five) minutes x 3 doses as needed for chest pain.   pantoprazole (PROTONIX) 40 MG tablet TAKE (1) TABLET TWICE A DAY.   tamsulosin (FLOMAX) 0.4 MG CAPS capsule TAKE (2) CAPSULES DAILY AFTER SUPPER.   Facility-Administered Encounter Medications as of 04/01/2021  Medication    cyanocobalamin ((VITAMIN B-12)) injection 1,000 mcg    Patient Active Problem List   Diagnosis Date Noted   Back pain without sciatica 10/31/2020   Degenerative disc disease, lumbar 01/16/2019   Depression    Hypokalemia 06/27/2018   Seizure disorder ([place]) 02/07/2018   Apraxia of speech 10/13/2017   Left-sided weakness 10/13/2017   Coronary artery disease of native artery of native heart with stable angina pectoris ([place])    Illiteracy 10/23/2016   Carpal tunnel syndrome of right wrist 06/15/2016   GAD (generalized anxiety disorder) 05/01/2016   History of MI (myocardial infarction) 02/26/2015   Dyslipidemia, goal LDL below 70 10/08/2009   Infantile cerebral palsy ([place]) 10/08/2009   Essential hypertension 10/08/2009    Conditions to be addressed/monitored: monitor client management of anxiety and depression issues  Care Plan : [name]  Updates made by [name], [name] since 04/01/2021 12:00 AM     Problem: Emotional Distress      Goal: Emotional Health Supported;Manage ADLs; Manage depression issues   Start Date: 04/01/2021  Expected End Date: 06/24/2021  This Visit's Progress: On track  Recent Progress: On track  Priority: Medium  Note:   Current Barriers:  Chronic Mental Health needs related to depression management and anxiety management Mobility issues Pain issues Illiteracy Suicidal Ideation/Homicidal Ideation: No  Clinical Social Work Goal(s):  patient will work with SW monthly by telephone or in person to reduce or manage symptoms related to depression and anxiety issues Patient will call RNCM or [name] as needed in next 30 days for CCM support Patient will communicate with [name] in next 30 days to discuss mobility issues of client and to discuss pain issues of client  Interventions: 1:1 collaboration with [name], MD regarding development and update of comprehensive plan of care as evidenced by provider attestation and co-signature [name] and  client spoke of dental needs of client.  Client did not complain of mouth pain. However, he did say that he had one tooth which was loose. He asked if dental appointment for him could be set up after the first of the year.  He said if he could get a dental appointment in January of 2023 that would work well . Provided counseling support for client.  [name] and client spoke of CAP program.  Andrick said he plans to call CAP representative tomorrow to ask representative from CAP to please remail CAP forms to [name] for PCP office to complete.  Discussed food supply of client. He said he had good food supply. He also said that his home health aide, Theadora Rama, had been sick for a few days.  Patient Self Care Activities:  Self administers medications as prescribed Attends all scheduled provider appointments Performs ADL's independently  Patient Coping Strengths:  Family  Patient Self Care Deficits:  Mobility issues Needs some help occasionally with in home care needs  Patient Goals:  - spend time or talk with others at least 2 to 3 times per week - practice relaxation or meditation daily - keep a calendar with appointment dates  Follow Up Plan: [name] to call client on 04/29/21 at  11:15 AM to  assess needs of client. [name].Doralee Kocak MSW, [name] Licensed Clinical Social Worker [facility] Care Management [phone]

## 2021-04-02 ENCOUNTER — Telehealth: Payer: Medicare Other

## 2021-04-04 ENCOUNTER — Telehealth: Payer: Medicare Other

## 2021-04-08 ENCOUNTER — Ambulatory Visit: Payer: Medicare Other | Admitting: Licensed Clinical Social Worker

## 2021-04-08 DIAGNOSIS — F32A Depression, unspecified: Secondary | ICD-10-CM

## 2021-04-08 DIAGNOSIS — M5136 Other intervertebral disc degeneration, lumbar region: Secondary | ICD-10-CM

## 2021-04-08 DIAGNOSIS — E538 Deficiency of other specified B group vitamins: Secondary | ICD-10-CM

## 2021-04-08 DIAGNOSIS — I25118 Atherosclerotic heart disease of native coronary artery with other forms of angina pectoris: Secondary | ICD-10-CM

## 2021-04-08 DIAGNOSIS — E785 Hyperlipidemia, unspecified: Secondary | ICD-10-CM

## 2021-04-08 DIAGNOSIS — F411 Generalized anxiety disorder: Secondary | ICD-10-CM

## 2021-04-08 DIAGNOSIS — G809 Cerebral palsy, unspecified: Secondary | ICD-10-CM

## 2021-04-08 DIAGNOSIS — I252 Old myocardial infarction: Secondary | ICD-10-CM

## 2021-04-08 DIAGNOSIS — J441 Chronic obstructive pulmonary disease with (acute) exacerbation: Secondary | ICD-10-CM

## 2021-04-08 DIAGNOSIS — I209 Angina pectoris, unspecified: Secondary | ICD-10-CM

## 2021-04-08 DIAGNOSIS — I10 Essential (primary) hypertension: Secondary | ICD-10-CM

## 2021-04-08 DIAGNOSIS — R482 Apraxia: Secondary | ICD-10-CM

## 2021-04-08 DIAGNOSIS — Z550 Illiteracy and low-level literacy: Secondary | ICD-10-CM

## 2021-04-08 NOTE — Chronic Care Management (AMB) (Signed)
Chronic Care Management    Clinical Social Work Note  04/08/2021 Name: [name] MRN: [id number] DOB: 12-13-1955  [name] is a 65 y.o. year old male who is a primary care patient of [name], James Gash, MD. The CCM team was consulted to assist the patient with chronic disease management and/or care coordination needs related to: [name] .   Engaged with patient by telephone for follow up visit in response to provider referral for social work chronic care management and care coordination services.   Consent to Services:  The patient was given information about Chronic Care Management services, agreed to services, and gave verbal consent prior to initiation of services.  Please see initial visit note for detailed documentation.   Patient agreed to services and consent obtained.   Assessment: Review of patient past medical history, allergies, medications, and health status, including review of relevant consultants reports was performed today as part of a comprehensive evaluation and provision of chronic care management and care coordination services.     SDOH (Social Determinants of Health) assessments and interventions performed:  SDOH Interventions    Flowsheet Row Most Recent Value  SDOH Interventions   Physical Activity Interventions Other (Comments)  [walking challenges,  uses a cane to help him walk]  Stress Interventions Provide Counseling  [client has stress related to managing medical needs. client has stress related to home care issues]  Depression Interventions/Treatment  Currently on Treatment        Advanced Directives Status: See Vynca application for related entries.  CCM Care Plan  Allergies  Allergen Reactions   Chantix [Varenicline Tartrate] Nausea And Vomiting    Outpatient Encounter Medications as of 04/08/2021  Medication Sig   celecoxib (CELEBREX) 200 MG capsule TAKE (1) CAPSULE DAILY AS NEEDED.   acetaminophen (TYLENOL) 325 MG tablet  Take 2 tablets (650 mg total) by mouth every 6 (six) hours as needed for mild pain or headache.   albuterol (PROAIR HFA) 108 (90 Base) MCG/ACT inhaler 1-2 puffs every 6 hours as needed wheezing or shortness of breath.   aspirin EC 81 MG tablet Take 1 tablet (81 mg total) by mouth daily.   atorvastatin (LIPITOR) 40 MG tablet TAKE 1 TABLET ONCE DAILY AT 6PM   buPROPion (WELLBUTRIN SR) 150 MG 12 hr tablet Take 1 tablet (150 mg total) by mouth daily with breakfast.   cetaphil (CETAPHIL) lotion Apply 1 application topically 2 (two) times daily. For rash. Apply after washing with [facility] shower gel and rinsing with clear warm water.   DULoxetine (CYMBALTA) 60 MG capsule TAKE 1 CAPSULE ONCE DAILY WITH LARGEST MEAL   fexofenadine (ALLEGRA) 180 MG tablet Take 1 tablet (180 mg total) by mouth daily. For allergy symptoms   fluocinonide-emollient (LIDEX-E) 0.05 % cream Apply 1 application topically 2 (two) times daily. To affected areas   fluticasone (FLONASE) 50 MCG/ACT nasal spray Place 2 sprays into both nostrils daily as needed for allergies or rhinitis.   fluticasone furoate-vilanterol (BREO ELLIPTA) 100-25 MCG/INH AEPB Inhale 1 puff into the lungs daily.   Ipratropium-Albuterol (COMBIVENT RESPIMAT) 20-100 MCG/ACT AERS respimat Inhale 1 puff into the lungs every 6 (six) hours   isosorbide mononitrate (IMDUR) 60 MG 24 hr tablet Take 1 tablet (60 mg total) by mouth daily. (NEEDS TO BE SEEN BEFORE NEXT REFILL)   metoprolol tartrate (LOPRESSOR) 25 MG tablet TAKE (1) TABLET TWICE A DAY.   mirtazapine (REMERON) 15 MG tablet TAKE 1 TABLET AT BEDTIME AS NEEDED FOR SLEEP  nitroGLYCERIN (NITROSTAT) 0.4 MG SL tablet Place 1 tablet (0.4 mg total) under the tongue every 5 (five) minutes x 3 doses as needed for chest pain.   pantoprazole (PROTONIX) 40 MG tablet TAKE (1) TABLET TWICE A DAY.   tamsulosin (FLOMAX) 0.4 MG CAPS capsule TAKE (2) CAPSULES DAILY AFTER SUPPER.   Facility-Administered Encounter  Medications as of 04/08/2021  Medication   cyanocobalamin ((VITAMIN B-12)) injection 1,000 mcg    Patient Active Problem List   Diagnosis Date Noted   Back pain without sciatica 10/31/2020   Degenerative disc disease, lumbar 01/16/2019   Depression    Hypokalemia 06/27/2018   Seizure disorder ([place]) 02/07/2018   Apraxia of speech 10/13/2017   Left-sided weakness 10/13/2017   Coronary artery disease of native artery of native heart with stable angina pectoris ([place])    Illiteracy 10/23/2016   Carpal tunnel syndrome of right wrist 06/15/2016   GAD (generalized anxiety disorder) 05/01/2016   History of MI (myocardial infarction) 02/26/2015   Dyslipidemia, goal LDL below 70 10/08/2009   Infantile cerebral palsy ([place]) 10/08/2009   Essential hypertension 10/08/2009    Conditions to be addressed/monitored: monitor client management of depression and anxiety issues  Care Plan : [name]  Updates made by [name], [name] since 04/08/2021 12:00 AM     Problem: Emotional Distress      Goal: Emotional Health Supported;Manage ADLs; Manage depression issues   Start Date: 04/08/2021  Expected End Date: 07/04/2021  This Visit's Progress: On track  Recent Progress: On track  Priority: Medium  Note:   Current Barriers:  Chronic Mental Health needs related to depression management and anxiety management Mobility issues Pain issues Illiteracy Suicidal Ideation/Homicidal Ideation: No  Clinical Social Work Goal(s):  patient will work with SW monthly by telephone or in person to reduce or manage symptoms related to depression and anxiety issues Patient will call RNCM or [name] as needed in next 30 days for CCM support Patient will communicate with [name] in next 30 days to discuss mobility issues of client and to discuss pain issues of client  Interventions: 1:1 collaboration with James Fraise, MD regarding development and update of comprehensive plan of care as evidenced by  provider attestation and co-signature [name] and client spoke of CAP program.  [name] said he plans to call CAP representative tomorrow to ask representative from CAP to please remail CAP forms to [name] for PCP office to complete.  Discussed fuel oil supply of client. Monta said he has adequate fuel oil supply for now. Discussed next client appointment for  B12 injection at [facility]. [name] informed client that transportation had been arranged by [name] to transport client to and from his next B12 appointment at [facility] on 04/17/21 at 10:30 AM.  Client understood this information. Provided counseling support for client  Patient Self Care Activities:  Self administers medications as prescribed Attends all scheduled provider appointments Performs ADL's independently  Patient Coping Strengths:  Family  Patient Self Care Deficits:  Mobility issues Needs some help occasionally with in home care needs  Patient Goals:  - spend time or talk with others at least 2 to 3 times per week - practice relaxation or meditation daily - keep a calendar with appointment dates  Follow Up Plan: [name] to call client on 04/29/21 at  11:15 AM to assess needs of client. [name].Hans Rusher MSW, [name] Licensed Clinical Social Worker [facility] Care Management [phone]

## 2021-04-08 NOTE — Patient Instructions (Addendum)
Visit Information  Patient Goals:  Manage emotions. Manage depression issues. Complete ADLs daily with assistance as needed  Timeframe:  Short-Term Goal Priority:  Medium Progress: On Track Start Date:       04/08/21                   Expected End Date:       07/04/21             Follow Up Date  04/29/21 at 11:15 AM   Manage emotions. Manage depression issues . Complete ADLs daily with assistance as needed   Why is this important?   When you are stressed, down or upset, your body reacts too.  For example, your blood pressure may get higher; you may have a headache or stomachache.  When your emotions get the best of you, your body's ability to fight off cold and flu gets weak.  These steps will help you manage your emotions.     Patient Self Care Activities:  Self administers medications as prescribed Attends all scheduled provider appointments  Patient Coping Strengths:  Family Friends  Patient Self Care Deficits:  Mobility issues Depression issues Anxiety issues  Patient Goals:  - spend time or talk with others at least 2 to 3 times per week - practice relaxation or meditation daily - keep a calendar with appointment dates  Follow Up Plan: [name] to call client on  04/29/21 at 11:15 AM to assess client needs   [name].Icesis Renn MSW, [name] Licensed Clinical Social Worker [facility] Care Management [phone]

## 2021-04-09 ENCOUNTER — Other Ambulatory Visit: Payer: Self-pay | Admitting: Family Medicine

## 2021-04-09 DIAGNOSIS — I25118 Atherosclerotic heart disease of native coronary artery with other forms of angina pectoris: Secondary | ICD-10-CM

## 2021-04-09 DIAGNOSIS — E785 Hyperlipidemia, unspecified: Secondary | ICD-10-CM

## 2021-04-10 ENCOUNTER — Ambulatory Visit: Payer: Medicare Other | Admitting: Licensed Clinical Social Worker

## 2021-04-10 DIAGNOSIS — J441 Chronic obstructive pulmonary disease with (acute) exacerbation: Secondary | ICD-10-CM

## 2021-04-10 DIAGNOSIS — Z550 Illiteracy and low-level literacy: Secondary | ICD-10-CM

## 2021-04-10 DIAGNOSIS — F411 Generalized anxiety disorder: Secondary | ICD-10-CM

## 2021-04-10 DIAGNOSIS — I252 Old myocardial infarction: Secondary | ICD-10-CM

## 2021-04-10 DIAGNOSIS — F32A Depression, unspecified: Secondary | ICD-10-CM

## 2021-04-10 DIAGNOSIS — I25118 Atherosclerotic heart disease of native coronary artery with other forms of angina pectoris: Secondary | ICD-10-CM

## 2021-04-10 DIAGNOSIS — M5136 Other intervertebral disc degeneration, lumbar region: Secondary | ICD-10-CM

## 2021-04-10 DIAGNOSIS — R482 Apraxia: Secondary | ICD-10-CM

## 2021-04-10 DIAGNOSIS — I209 Angina pectoris, unspecified: Secondary | ICD-10-CM

## 2021-04-10 DIAGNOSIS — E538 Deficiency of other specified B group vitamins: Secondary | ICD-10-CM

## 2021-04-10 DIAGNOSIS — G809 Cerebral palsy, unspecified: Secondary | ICD-10-CM

## 2021-04-10 DIAGNOSIS — I10 Essential (primary) hypertension: Secondary | ICD-10-CM

## 2021-04-10 DIAGNOSIS — E785 Hyperlipidemia, unspecified: Secondary | ICD-10-CM

## 2021-04-10 NOTE — Chronic Care Management (AMB) (Signed)
Chronic Care Management    Clinical Social Work Note  04/10/2021 Name: [name] MRN: [id number] DOB: Jan 11, 1956  [name] is a 65 y.o. year old male who is a primary care patient of [name], [name], MD. The CCM team was consulted to assist the patient with chronic disease management and/or care coordination needs related to: [name] .   Engaged with patient by telephone for follow up visit in response to provider referral for social work chronic care management and care coordination services.   Consent to Services:  The patient was given information about Chronic Care Management services, agreed to services, and gave verbal consent prior to initiation of services.  Please see initial visit note for detailed documentation.   Patient agreed to services and consent obtained.   Assessment: Review of patient past medical history, allergies, medications, and health status, including review of relevant consultants reports was performed today as part of a comprehensive evaluation and provision of chronic care management and care coordination services.     SDOH (Social Determinants of Health) assessments and interventions performed:  SDOH Interventions    Flowsheet Row Most Recent Value  SDOH Interventions   Physical Activity Interventions Other (Comments)  [has walking challenges]  Stress Interventions Provide Counseling  [has stress related to managing medical needs]  Depression Interventions/Treatment  Currently on Treatment        Advanced Directives Status: See Vynca application for related entries.  CCM Care Plan  Allergies  Allergen Reactions   Chantix [Varenicline Tartrate] Nausea And Vomiting    Outpatient Encounter Medications as of 04/10/2021  Medication Sig   celecoxib (CELEBREX) 200 MG capsule TAKE (1) CAPSULE DAILY AS NEEDED.   acetaminophen (TYLENOL) 325 MG tablet Take 2 tablets (650 mg total) by mouth every 6 (six) hours as needed for mild pain  or headache.   albuterol (PROAIR HFA) 108 (90 Base) MCG/ACT inhaler 1-2 puffs every 6 hours as needed wheezing or shortness of breath.   aspirin EC 81 MG tablet Take 1 tablet (81 mg total) by mouth daily.   atorvastatin (LIPITOR) 40 MG tablet TAKE 1 TABLET ONCE DAILY AT 6PM   buPROPion (WELLBUTRIN SR) 150 MG 12 hr tablet Take 1 tablet (150 mg total) by mouth daily with breakfast.   cetaphil (CETAPHIL) lotion Apply 1 application topically 2 (two) times daily. For rash. Apply after washing with [facility] shower gel and rinsing with clear warm water.   DULoxetine (CYMBALTA) 60 MG capsule TAKE 1 CAPSULE ONCE DAILY WITH LARGEST MEAL   fexofenadine (ALLEGRA) 180 MG tablet Take 1 tablet (180 mg total) by mouth daily. For allergy symptoms   fluocinonide-emollient (LIDEX-E) 0.05 % cream Apply 1 application topically 2 (two) times daily. To affected areas   fluticasone (FLONASE) 50 MCG/ACT nasal spray Place 2 sprays into both nostrils daily as needed for allergies or rhinitis.   fluticasone furoate-vilanterol (BREO ELLIPTA) 100-25 MCG/INH AEPB Inhale 1 puff into the lungs daily.   Ipratropium-Albuterol (COMBIVENT RESPIMAT) 20-100 MCG/ACT AERS respimat Inhale 1 puff into the lungs every 6 (six) hours   isosorbide mononitrate (IMDUR) 60 MG 24 hr tablet Take 1 tablet (60 mg total) by mouth daily. (NEEDS TO BE SEEN BEFORE NEXT REFILL)   metoprolol tartrate (LOPRESSOR) 25 MG tablet TAKE (1) TABLET TWICE A DAY.   mirtazapine (REMERON) 15 MG tablet TAKE 1 TABLET AT BEDTIME AS NEEDED FOR SLEEP   nitroGLYCERIN (NITROSTAT) 0.4 MG SL tablet Place 1 tablet (0.4 mg total) under the tongue  every 5 (five) minutes x 3 doses as needed for chest pain.   pantoprazole (PROTONIX) 40 MG tablet TAKE (1) TABLET TWICE A DAY.   tamsulosin (FLOMAX) 0.4 MG CAPS capsule TAKE (2) CAPSULES DAILY AFTER SUPPER.   Facility-Administered Encounter Medications as of 04/10/2021  Medication   cyanocobalamin ((VITAMIN B-12)) injection 1,000  mcg    Patient Active Problem List   Diagnosis Date Noted   Back pain without sciatica 10/31/2020   Degenerative disc disease, lumbar 01/16/2019   Depression    Hypokalemia 06/27/2018   Seizure disorder ([place]) 02/07/2018   Apraxia of speech 10/13/2017   Left-sided weakness 10/13/2017   Coronary artery disease of native artery of native heart with stable angina pectoris ([place])    Illiteracy 10/23/2016   Carpal tunnel syndrome of right wrist 06/15/2016   GAD (generalized anxiety disorder) 05/01/2016   History of MI (myocardial infarction) 02/26/2015   Dyslipidemia, goal LDL below 70 10/08/2009   Infantile cerebral palsy ([place]) 10/08/2009   Essential hypertension 10/08/2009    Conditions to be addressed/monitored: monitor client management of depression issues and of anxiety issues  Care Plan : [name]  Updates made by [name], [name] since 04/10/2021 12:00 AM     Problem: Emotional Distress      Goal: Emotional Health Supported;Manage ADLs; Manage depression issues   Start Date: 04/08/2021  Expected End Date: 07/04/2021  This Visit's Progress: On track  Recent Progress: On track  Priority: Medium  Note:   Current Barriers:  Chronic Mental Health needs related to depression management and anxiety management Mobility issues Pain issues Illiteracy Suicidal Ideation/Homicidal Ideation: No  Clinical Social Work Goal(s):  patient will work with SW monthly by telephone or in person to reduce or manage symptoms related to depression and anxiety issues Patient will call RNCM or [name] as needed in next 30 days for CCM support Patient will communicate with [name] in next 30 days to discuss mobility issues of client and to discuss pain issues of client  Interventions: 1:1 collaboration with [name], MD regarding development and update of comprehensive plan of care as evidenced by provider attestation and co-signature Discussed fuel oil supply of client. [name]  said he has adequate fuel oil supply for now. Discussed next client appointment for  B12 injection at [facility]. [name] informed client that transportation had been arranged by [name] to transport client to and from his next B12 appointment at [facility] on 04/17/21 at 10:30 AM.  Client understood this information. Discussed food supply of client. Leone said that his aide had taken him to grocery store recently and Zhane had purchased needed food items. He said he has adequate food supply Discussed support from home health aide.  Patient Self Care Activities:  Self administers medications as prescribed Attends all scheduled provider appointments Performs ADL's independently  Patient Coping Strengths:  Family  Patient Self Care Deficits:  Mobility issues Needs some help occasionally with in home care needs  Patient Goals:  - spend time or talk with others at least 2 to 3 times per week - practice relaxation or meditation daily - keep a calendar with appointment dates  Follow Up Plan: [name] to call client on 04/29/21 at  11:15 AM to assess needs of client. [name].Furman Trentman MSW, [name] Licensed Clinical Social Worker [facility] Care Management [phone]

## 2021-04-10 NOTE — Patient Instructions (Addendum)
Visit Information  Patient Goals:  Manage emotions. Manage depression issues. Complete ADLs daily with assistance as needed.   Timeframe:  Short-Term Goal Priority:  Medium Progress: On Track Start Date:       04/08/21                   Expected End Date:       07/04/21             Follow Up Date  04/29/21 at 11:15 AM   Manage emotions. Manage depression issues . Complete ADLs daily with assistance as needed   Why is this important?   When you are stressed, down or upset, your body reacts too.  For example, your blood pressure may get higher; you may have a headache or stomachache.  When your emotions get the best of you, your body's ability to fight off cold and flu gets weak.  These steps will help you manage your emotions.     Patient Self Care Activities:  Self administers medications as prescribed Attends all scheduled provider appointments  Patient Coping Strengths:  Family Friends  Patient Self Care Deficits:  Mobility issues Depression issues Anxiety issues  Patient Goals:  - spend time or talk with others at least 2 to 3 times per week - practice relaxation or meditation daily - keep a calendar with appointment dates  Follow Up Plan: [name] to call client on  04/29/21 at 11:15 AM to assess client needs   Rishabh Rinkenberger.Jackie Littlejohn MSW, [name] Licensed Clinical Social Worker [facility] Care Management [phone]

## 2021-04-17 ENCOUNTER — Ambulatory Visit (INDEPENDENT_AMBULATORY_CARE_PROVIDER_SITE_OTHER): Payer: Medicare Other

## 2021-04-17 DIAGNOSIS — E538 Deficiency of other specified B group vitamins: Secondary | ICD-10-CM | POA: Diagnosis not present

## 2021-04-17 NOTE — Progress Notes (Signed)
Cyanocobalamin injection given to left deltoid.  Patient tolerated well. 

## 2021-04-25 ENCOUNTER — Other Ambulatory Visit: Payer: Self-pay | Admitting: Family Medicine

## 2021-04-25 DIAGNOSIS — I25118 Atherosclerotic heart disease of native coronary artery with other forms of angina pectoris: Secondary | ICD-10-CM

## 2021-04-25 DIAGNOSIS — G4701 Insomnia due to medical condition: Secondary | ICD-10-CM

## 2021-04-25 DIAGNOSIS — I209 Angina pectoris, unspecified: Secondary | ICD-10-CM

## 2021-04-25 DIAGNOSIS — F3341 Major depressive disorder, recurrent, in partial remission: Secondary | ICD-10-CM

## 2021-04-29 ENCOUNTER — Ambulatory Visit (INDEPENDENT_AMBULATORY_CARE_PROVIDER_SITE_OTHER): Payer: Medicare Other | Admitting: Licensed Clinical Social Worker

## 2021-04-29 DIAGNOSIS — I209 Angina pectoris, unspecified: Secondary | ICD-10-CM

## 2021-04-29 DIAGNOSIS — I10 Essential (primary) hypertension: Secondary | ICD-10-CM

## 2021-04-29 DIAGNOSIS — E538 Deficiency of other specified B group vitamins: Secondary | ICD-10-CM

## 2021-04-29 DIAGNOSIS — R531 Weakness: Secondary | ICD-10-CM

## 2021-04-29 DIAGNOSIS — I252 Old myocardial infarction: Secondary | ICD-10-CM

## 2021-04-29 DIAGNOSIS — F32A Depression, unspecified: Secondary | ICD-10-CM

## 2021-04-29 DIAGNOSIS — G809 Cerebral palsy, unspecified: Secondary | ICD-10-CM

## 2021-04-29 DIAGNOSIS — F411 Generalized anxiety disorder: Secondary | ICD-10-CM

## 2021-04-29 DIAGNOSIS — I25118 Atherosclerotic heart disease of native coronary artery with other forms of angina pectoris: Secondary | ICD-10-CM

## 2021-04-29 DIAGNOSIS — J441 Chronic obstructive pulmonary disease with (acute) exacerbation: Secondary | ICD-10-CM

## 2021-04-29 DIAGNOSIS — E785 Hyperlipidemia, unspecified: Secondary | ICD-10-CM

## 2021-04-29 DIAGNOSIS — G40909 Epilepsy, unspecified, not intractable, without status epilepticus: Secondary | ICD-10-CM

## 2021-04-29 NOTE — Chronic Care Management (AMB) (Signed)
Chronic Care Management    Clinical Social Work Note  04/29/2021 Name: [name] MRN: [id number] DOB: [birth year]  [name] is a 66 y.o. year old male who is a primary care patient of [name], [name], MD. The CCM team was consulted to assist the patient with chronic disease management and/or care coordination needs related to: [name] .   Engaged with patient by telephone for follow up visit in response to provider referral for social work chronic care management and care coordination services.   Consent to Services:  The patient was given information about Chronic Care Management services, agreed to services, and gave verbal consent prior to initiation of services.  Please see initial visit note for detailed documentation.   Patient agreed to services and consent obtained.   Assessment: Review of patient past medical history, allergies, medications, and health status, including review of relevant consultants reports was performed today as part of a comprehensive evaluation and provision of chronic care management and care coordination services.     SDOH (Social Determinants of Health) assessments and interventions performed:  SDOH Interventions    Flowsheet Row Most Recent Value  SDOH Interventions   Physical Activity Interventions Other (Comments)  [walking challenges. [name] uses a cane to help him walk]  Stress Interventions Provide Counseling  [client has stress related to financial needs/ monthly bills due. client has stress related to managing medical needs]  Depression Interventions/Treatment  Currently on Treatment        Advanced Directives Status: See [employer] application for related entries.  CCM Care Plan  Allergies  Allergen Reactions   Chantix [Varenicline Tartrate] Nausea And Vomiting    Outpatient Encounter Medications as of 04/29/2021  Medication Sig   celecoxib (CELEBREX) 200 MG capsule TAKE (1) CAPSULE DAILY AS NEEDED.   acetaminophen  (TYLENOL) 325 MG tablet Take 2 tablets (650 mg total) by mouth every 6 (six) hours as needed for mild pain or headache.   albuterol (PROAIR HFA) 108 (90 Base) MCG/ACT inhaler 1-2 puffs every 6 hours as needed wheezing or shortness of breath.   aspirin EC 81 MG tablet Take 1 tablet (81 mg total) by mouth daily.   atorvastatin (LIPITOR) 40 MG tablet TAKE 1 TABLET ONCE DAILY AT 6PM   buPROPion (WELLBUTRIN SR) 150 MG 12 hr tablet Take 1 tablet (150 mg total) by mouth daily with breakfast.   cetaphil (CETAPHIL) lotion Apply 1 application topically 2 (two) times daily. For rash. Apply after washing with [facility] shower gel and rinsing with clear warm water.   DULoxetine (CYMBALTA) 60 MG capsule TAKE 1 CAPSULE ONCE DAILY WITH LARGEST MEAL   fexofenadine (ALLEGRA) 180 MG tablet Take 1 tablet (180 mg total) by mouth daily. For allergy symptoms   fluocinonide-emollient (LIDEX-E) 0.05 % cream Apply 1 application topically 2 (two) times daily. To affected areas   fluticasone (FLONASE) 50 MCG/ACT nasal spray Place 2 sprays into both nostrils daily as needed for allergies or rhinitis.   fluticasone furoate-vilanterol (BREO ELLIPTA) 100-25 MCG/INH AEPB Inhale 1 puff into the lungs daily.   Ipratropium-Albuterol (COMBIVENT RESPIMAT) 20-100 MCG/ACT AERS respimat Inhale 1 puff into the lungs every 6 (six) hours   isosorbide mononitrate (IMDUR) 60 MG 24 hr tablet TAKE 1 TABLET DAILY   metoprolol tartrate (LOPRESSOR) 25 MG tablet TAKE (1) TABLET TWICE A DAY.   mirtazapine (REMERON) 15 MG tablet TAKE 1 TABLET AT BEDTIME AS NEEDED FOR SLEEP   nitroGLYCERIN (NITROSTAT) 0.4 MG SL tablet Place 1 tablet (  0.4 mg total) under the tongue every 5 (five) minutes x 3 doses as needed for chest pain.   pantoprazole (PROTONIX) 40 MG tablet TAKE (1) TABLET TWICE A DAY.   tamsulosin (FLOMAX) 0.4 MG CAPS capsule TAKE (2) CAPSULES DAILY AFTER SUPPER.   Facility-Administered Encounter Medications as of 04/29/2021  Medication    cyanocobalamin ((VITAMIN B-12)) injection 1,000 mcg    Patient Active Problem List   Diagnosis Date Noted   Back pain without sciatica 10/31/2020   Degenerative disc disease, lumbar 01/16/2019   Depression    Hypokalemia 06/27/2018   Seizure disorder ([place]) 02/07/2018   Apraxia of speech 10/13/2017   Left-sided weakness 10/13/2017   Coronary artery disease of native artery of native heart with stable angina pectoris ([place])    Illiteracy 10/23/2016   Carpal tunnel syndrome of right wrist 06/15/2016   GAD (generalized anxiety disorder) 05/01/2016   History of MI (myocardial infarction) 02/26/2015   Dyslipidemia, goal LDL below 70 10/08/2009   Infantile cerebral palsy ([place]) 10/08/2009   Essential hypertension 10/08/2009    Conditions to be addressed/monitored: monitor client management of depression issues and of anxiety issues  Care Plan : [name]  Updates made by [name], [name] since 04/29/2021 12:00 AM     Problem: Emotional Distress      Goal: Emotional Health Supported;Manage ADLs; Manage depression issues   Start Date: 04/29/2021  Expected End Date: 07/24/2021  This Visit's Progress: On track  Recent Progress: On track  Priority: Medium  Note:   Current Barriers:  Chronic Mental Health needs related to depression management and anxiety management Mobility issues Pain issues Illiteracy Suicidal Ideation/Homicidal Ideation: No  Clinical Social Work Goal(s):  patient will work with SW monthly by telephone or in person to reduce or manage symptoms related to depression and anxiety issues Patient will call RNCM or [name] as needed in next 30 days for CCM support Patient will communicate with [name] in next 30 days to discuss mobility issues of client and to discuss pain issues of client  Interventions: 1:1 collaboration with [name], MD regarding development and update of comprehensive plan of care as evidenced by provider attestation and  co-signature Discussed fuel oil Hale of client. [name] for now. [name] Hale repairman came to his mobile home today and repaired his furnace. His furnace is working well now and [name] has adequate oil Hale.  [name] also Hale [name] had received a letter from community Hale saying that Hale could help him with $ 400.00 of fuel oil this Winter. [name] was glad to receive this letter of help from community Hale Discussed next client appointment for  B12 injection at [facility]. Client is scheduled for B12 injection at [facility] on 05/19/21 at 10:30 AM.  [name] called [name] today and [name] scheduled [name] to transport client to and from his B12 injection appointment at [facility] on 05/19/21. [name] informed client that transportation had been arranged by [name] to transport client to and from his next B12 appointment at [facility] on 05/19/21. Pick up time for client for transport is 10:00 AM on 05/19/21. Client is aware of this information.  Discussed food Hale of client. [name] Hale that [name] had adequate food Hale Discussed support of client from home health aide.[name] Hale his home health aide is coming tomorrow to assist him as scheduled.  Discussed recent client fall. [name] spoke of falling recently and bumping his left leg.  [name] Hale leg had been  bleeding at injury site and [name] is putting on several band aides per day on injury site.  Discussed client ambulation.  [name] is walking with use of a cane.  Patient Self Care Activities:  Self administers medications as prescribed Attends all scheduled provider appointments Performs ADL's independently  Patient Coping Strengths:  Family  Patient Self Care Deficits:  Mobility issues Needs some help occasionally with in home care needs  Patient Goals:  - spend time or talk with others at least 2 to 3 times per week - practice relaxation or meditation daily - keep a calendar with appointment dates  Follow Up Plan: [name] to call client on 06/19/21 at 9:00 AM to  assess needs of client. [name] MSW, [name] Licensed Clinical Social Worker [facility] Care Management [phone]

## 2021-04-29 NOTE — Patient Instructions (Addendum)
Visit Information  Patient Goals  Manage emotions. Manage depression issus. Complete ADLs daily with assistance as needed.   Timeframe:  Short-Term Goal Priority:  Medium Progress: On Track Start Date:    04/29/21                     Expected End Date:    07/24/21             Follow Up Date  06/19/21 at 9:00 AM    Manage emotions. Manage depression issues . Complete ADLs daily with assistance as needed   Why is this important?   When you are stressed, down or upset, your body reacts too.  For example, your blood pressure may get higher; you may have a headache or stomachache.  When your emotions get the best of you, your body's ability to fight off cold and flu gets weak.  These steps will help you manage your emotions.     Patient Self Care Activities:  Self administers medications as prescribed Attends all scheduled provider appointments  Patient Coping Strengths:  Family Friends  Patient Self Care Deficits:  Mobility issues Depression issues Anxiety issues  Patient Goals:  - spend time or talk with others at least 2 to 3 times per week - practice relaxation or meditation daily - keep a calendar with appointment dates  Follow Up Plan: [name] to call client on  06/19/21 at 9:00 AM to assess client needs   Mindy Behnken.Jaszmine Navejas MSW, [name] Licensed Clinical Social Worker [facility] Care Management [phone]

## 2021-05-12 ENCOUNTER — Ambulatory Visit: Payer: Commercial Managed Care - HMO | Admitting: *Deleted

## 2021-05-12 DIAGNOSIS — G809 Cerebral palsy, unspecified: Secondary | ICD-10-CM

## 2021-05-12 DIAGNOSIS — I10 Essential (primary) hypertension: Secondary | ICD-10-CM

## 2021-05-12 DIAGNOSIS — I25118 Atherosclerotic heart disease of native coronary artery with other forms of angina pectoris: Secondary | ICD-10-CM

## 2021-05-12 NOTE — Patient Instructions (Signed)
Visit Information  Patient Goals/Self-Care Activities: Patient will self administer medications as prescribed Patient will call pharmacy for medication refills Patient will continue to perform ADL's independently Patient will call provider office for new concerns or questions Patient/in-home care aid will work with PCP and Social Services office regarding CAPS application Patient will call CCM team member for additional assistance or questions regarding application (RNCM [phone])   Patient verbalizes understanding of instructions and care plan provided today and agrees to view in [place]. Active MyChart status confirmed with patient.    Plan:Telephone follow up appointment with care management team member scheduled for:  05/16/21 with RNCM The patient has been provided with contact information for the care management team and has been advised to call with any health related questions or concerns.   [name], BSN, RN-BC Embedded Chronic Care Manager [facility] / [place] Management Direct Dial: [phone]

## 2021-05-12 NOTE — Chronic Care Management (AMB) (Signed)
Chronic Care Management   CCM RN Visit Note  05/12/2021 Name: [name] MRN: [id number] DOB: 1955-05-09  Subjective: [name] is a 66 y.o. year old male who is a primary care patient of [name], [name], MD. The care management team was consulted for assistance with disease management and care coordination needs.    Engaged with patient by telephone for  care Coordination  in response to provider referral for case management and/or care coordination services.   Consent to Services:  The patient was given information about Chronic Care Management services, agreed to services, and gave verbal consent prior to initiation of services.  Please see initial visit note for detailed documentation.   Patient agreed to services and verbal consent obtained.   Assessment: Review of patient past medical history, allergies, medications, health status, including review of consultants reports, laboratory and other test data, was performed as part of comprehensive evaluation and provision of chronic care management services.   SDOH (Social Determinants of Health) assessments and interventions performed:    CCM Care Plan  Allergies  Allergen Reactions   Chantix [Varenicline Tartrate] Nausea And Vomiting    Outpatient Encounter Medications as of 05/12/2021  Medication Sig   celecoxib (CELEBREX) 200 MG capsule TAKE (1) CAPSULE DAILY AS NEEDED.   acetaminophen (TYLENOL) 325 MG tablet Take 2 tablets (650 mg total) by mouth every 6 (six) hours as needed for mild pain or headache.   albuterol (PROAIR HFA) 108 (90 Base) MCG/ACT inhaler 1-2 puffs every 6 hours as needed wheezing or shortness of breath.   aspirin EC 81 MG tablet Take 1 tablet (81 mg total) by mouth daily.   atorvastatin (LIPITOR) 40 MG tablet TAKE 1 TABLET ONCE DAILY AT 6PM   buPROPion (WELLBUTRIN SR) 150 MG 12 hr tablet Take 1 tablet (150 mg total) by mouth daily with breakfast.   cetaphil (CETAPHIL) lotion Apply 1 application  topically 2 (two) times daily. For rash. Apply after washing with [facility] shower gel and rinsing with clear warm water.   DULoxetine (CYMBALTA) 60 MG capsule TAKE 1 CAPSULE ONCE DAILY WITH LARGEST MEAL   fexofenadine (ALLEGRA) 180 MG tablet Take 1 tablet (180 mg total) by mouth daily. For allergy symptoms   fluocinonide-emollient (LIDEX-E) 0.05 % cream Apply 1 application topically 2 (two) times daily. To affected areas   fluticasone (FLONASE) 50 MCG/ACT nasal spray Place 2 sprays into both nostrils daily as needed for allergies or rhinitis.   fluticasone furoate-vilanterol (BREO ELLIPTA) 100-25 MCG/INH AEPB Inhale 1 puff into the lungs daily.   Ipratropium-Albuterol (COMBIVENT RESPIMAT) 20-100 MCG/ACT AERS respimat Inhale 1 puff into the lungs every 6 (six) hours   isosorbide mononitrate (IMDUR) 60 MG 24 hr tablet TAKE 1 TABLET DAILY   metoprolol tartrate (LOPRESSOR) 25 MG tablet TAKE (1) TABLET TWICE A DAY.   mirtazapine (REMERON) 15 MG tablet TAKE 1 TABLET AT BEDTIME AS NEEDED FOR SLEEP   nitroGLYCERIN (NITROSTAT) 0.4 MG SL tablet Place 1 tablet (0.4 mg total) under the tongue every 5 (five) minutes x 3 doses as needed for chest pain.   pantoprazole (PROTONIX) 40 MG tablet TAKE (1) TABLET TWICE A DAY.   tamsulosin (FLOMAX) 0.4 MG CAPS capsule TAKE (2) CAPSULES DAILY AFTER SUPPER.   Facility-Administered Encounter Medications as of 05/12/2021  Medication   cyanocobalamin ((VITAMIN B-12)) injection 1,000 mcg    Patient Active Problem List   Diagnosis Date Noted   Back pain without sciatica 10/31/2020   Degenerative disc disease, lumbar  01/16/2019   Depression    Hypokalemia 06/27/2018   Seizure disorder ([place]) 02/07/2018   Apraxia of speech 10/13/2017   Left-sided weakness 10/13/2017   Coronary artery disease of native artery of native heart with stable angina pectoris ([place])    Illiteracy 10/23/2016   Carpal tunnel syndrome of right wrist 06/15/2016   GAD (generalized anxiety  disorder) 05/01/2016   History of MI (myocardial infarction) 02/26/2015   Dyslipidemia, goal LDL below 70 10/08/2009   Infantile cerebral palsy ([place]) 10/08/2009   Essential hypertension 10/08/2009    Conditions to be addressed/monitored:CAD, HTN, and cerebral palsy  Care Plan : [facility] Care Plan  Updates made by [name], RN since 05/12/2021 12:00 AM     Problem: Chronic Disease Management Needs   Priority: Medium     Long-Range Goal: Patient will Work With Consulting civil engineer Regarding Care Management and Care Coordination Associated with Chronic Medical Conditions   This Visit's Progress: On track  Recent Progress: On track  Priority: High  Note:   Current Barriers:  Care Coordination needs related to Transportation, Level of care concerns, ADL IADL limitations, Family and relationship dysfunction, Social Isolation, Literacy concerns, Inability to perform ADL's independently, and Inability to perform IADL's independently  Chronic Disease Management support and education needs related to CAD, HTN, Pulmonary Disease, and Cerebral Palsy Lacks caregiver support.  Unable to independently manage his medical conditions Transportation needs Limited mobility  RNCM Clinical Goal(s):  Patient will take all medications exactly as prescribed and will call provider for medication related questions continue to work with RN Care Manager to address care management and care coordination needs related to CAD, HTN, Pulmonary Disease, and cerebral palsy  work with Education officer, museum to address Limited social support, Transportation, Level of care concerns, ADL IADL limitations, Social Isolation, Literacy concerns, Cognitive Deficits, and Memory Deficits related to the management of cerebral palsy  through collaboration with Consulting civil engineer, provider, and care team.   Interventions: 1:1 collaboration with primary care provider regarding development and update of comprehensive plan of care as evidenced by  provider attestation and co-signature Inter-disciplinary care team collaboration (see longitudinal plan of care) Evaluation of current treatment plan related to  self management and patient's adherence to plan as established by provider Provided with RNCM contact number and encouraged to reach out as needed   SDOH Barriers (Status: Goal on track: YES.)  Patient interviewed and SDOH assessment performed       Provided education to patient/caregiver regarding level of care options. Discussed prior applications for additional hours for PCS through [employer]. Spoke with patient and his in-home care Aid.  He does not qualify for any additional PCS hours unless he has dementia/alzheimer's But aid does does a significant amount to help with ADLs and IADLs Discussed CAPS application Collaborated with [place] Clinical staff and [name] regarding application Aid to drop off application today, attn: Jamelle Haring, LPN Advised that it has to be completed, signed, and returned within 10 days. Aid will pick-up once provider portion is complete   Hypertension: (Status: Condition stable. Not addressed this visit.) Last practice recorded BP readings:  BP Readings from Last 3 Encounters:  01/31/21 130/76  10/31/20 114/64  07/17/20 118/78   Evaluation of current treatment plan related to hypertension and patient's adherence to plan as established by provider. Reviewed medications Medications are prepackaged for the month and delivered by [facility] Patient is compliant with medications No problems with affordability. Has Medicaid secondary which covers medication cost.  Advised patient to check and record blood pressure daily and as needed Advised to call PCP with any blood pressure readings that are outside of recommended range Bring log to PCP and cardiology appointments Recommended DASH diet Reviewed upcoming appointments Discussed transportation through [name]   CAD  (Status: Condition  stable. Not addressed this visit.) Assessed understanding of CAD diagnosis Medications reviewed including medications utilized in CAD treatment plan Evaluation of current treatment plan related to CAD and patient's adherence to plan as established by provider. Has not had to take nitro "in a while" Reviewed s/s of a heart attack No chest pain or pressure No shortness of breath Encouraged increasing physical activity as tolerated Patient feels weak after walking short distances. He does try to walk some in his home. Encouraged to slowly increase the distance he walks in his home  Fall prevention discussed Discussed diet Has mostly frozen meals for convenience Reinforced need to limit fried, fatty, and sugary foods and to eat vegetables, fruits, and lean proteins Reinforced to call 911 if he has any s/s of a heart attack   Cerebral Palsy:  (Status: Condition stable. Not addressed this visit.) Family/social support discussed Patient has two sisters but they are not very involved in his care. He may talk with them once a month by telephone.  Discussed Personal Care Services [facility]) Has an aid, Theadora Rama, that comes Mon-Fri for a few hours each day. Totals 80 hrs a month. [place] [phone]) Theadora Rama assists with ADLs, bill payment, grocery shopping, and transport to appointments He feels very comfortable with her and says that if it wasn't for her, he wouldn't be able to care for himself Previously collaborated with [name], PCP office, [employer], and [place] to increase his aid hours Fall assessment complete Has had a couple of falls recently. Per patient, his legs just "give way"  Verbal education on fall prevention strategies given Discussed mobility and ambulation Does feel weak when walking. Can't walk very far.  Using a cane Encouraged to do physical therapy exercises that he was given months ago Encouraged to slowly increase the  distance he walks inside of his home to build up stamina and strength Encouraged to walk/exercise when he's not at home alone Discussed how sedentary he is and if he doesn't move around more, he'll just continue to decondition and physically decline Provided lots of encouragement Strongly advised to be completely open/honest with PCP about his limitations and any new or worsening problems. In order to qualify for more PCS hours, there has to be a change in status.  Therapeutic listening utilized regarding physical struggles, general health, and family/social dynamics   B12 Deficiency:  (Status: Condition stable. Not addressed this visit.) Reviewed recent office notes and lab results with patient No recent B12 levels Provider did recommend that  he continue monthly B12 injections Collaborated with front office staff to schedule his next B12 injection   Patient Goals/Self-Care Activities: Patient will self administer medications as prescribed Patient will call pharmacy for medication refills Patient will continue to perform ADL's independently Patient will call provider office for new concerns or questions Patient/in-home care aid will work with PCP and Social Services office regarding CAPS application Patient will call CCM team member for additional assistance or questions regarding application (RNCM [phone])    Plan:Telephone follow up appointment with care management team member scheduled for:  05/16/21 with RNCM The patient has been provided with contact information for the care management team and has  been advised to call with any health related questions or concerns.   [name], BSN, RN-BC Embedded Chronic Care Manager [facility] / [place] Management Direct Dial: [phone]

## 2021-05-13 ENCOUNTER — Ambulatory Visit: Payer: Medicare Other | Admitting: Licensed Clinical Social Worker

## 2021-05-13 DIAGNOSIS — E538 Deficiency of other specified B group vitamins: Secondary | ICD-10-CM

## 2021-05-13 DIAGNOSIS — Z550 Illiteracy and low-level literacy: Secondary | ICD-10-CM

## 2021-05-13 DIAGNOSIS — I209 Angina pectoris, unspecified: Secondary | ICD-10-CM

## 2021-05-13 DIAGNOSIS — E785 Hyperlipidemia, unspecified: Secondary | ICD-10-CM

## 2021-05-13 DIAGNOSIS — F411 Generalized anxiety disorder: Secondary | ICD-10-CM

## 2021-05-13 DIAGNOSIS — I10 Essential (primary) hypertension: Secondary | ICD-10-CM

## 2021-05-13 DIAGNOSIS — R482 Apraxia: Secondary | ICD-10-CM

## 2021-05-13 DIAGNOSIS — M51369 Other intervertebral disc degeneration, lumbar region without mention of lumbar back pain or lower extremity pain: Secondary | ICD-10-CM

## 2021-05-13 DIAGNOSIS — I252 Old myocardial infarction: Secondary | ICD-10-CM

## 2021-05-13 DIAGNOSIS — K219 Gastro-esophageal reflux disease without esophagitis: Secondary | ICD-10-CM

## 2021-05-13 DIAGNOSIS — M5136 Other intervertebral disc degeneration, lumbar region: Secondary | ICD-10-CM

## 2021-05-13 DIAGNOSIS — G809 Cerebral palsy, unspecified: Secondary | ICD-10-CM

## 2021-05-13 DIAGNOSIS — I25118 Atherosclerotic heart disease of native coronary artery with other forms of angina pectoris: Secondary | ICD-10-CM

## 2021-05-13 NOTE — Chronic Care Management (AMB) (Signed)
Chronic Care Management    Clinical Social Work Note  05/13/2021 Name: [name] MRN: [id number] DOB: 1956-01-02  [name] is a 66 y.o. year old male who is a primary care patient of [name], [name], MD. The CCM team was consulted to assist the patient with chronic disease management and/or care coordination needs related to: [name] .   Engaged with patient by telephone for follow up visit in response to provider referral for social work chronic care management and care coordination services.   Consent to Services:  The patient was given information about Chronic Care Management services, agreed to services, and gave verbal consent prior to initiation of services.  Please see initial visit note for detailed documentation.   Patient agreed to services and consent obtained.   Assessment: Review of patient past medical history, allergies, medications, and health status, including review of relevant consultants reports was performed today as part of a comprehensive evaluation and provision of chronic care management and care coordination services.     SDOH (Social Determinants of Health) assessments and interventions performed:  SDOH Interventions    Flowsheet Row Most Recent Value  SDOH Interventions   Physical Activity Interventions Other (Comments)  [walking challenges. client uses a cane to help him walk]  Stress Interventions Provide Counseling  [client has stress related to managing medical needs]  Depression Interventions/Treatment  Currently on Treatment        Advanced Directives Status: See Vyn[employer] application for related entries.  CCM Care Plan  Allergies  Allergen Reactions   Chantix [Varenicline Tartrate] Nausea And Vomiting    Outpatient Encounter Medications as of 05/13/2021  Medication Sig   celecoxib (CELEBREX) 200 MG capsule TAKE (1) CAPSULE DAILY AS NEEDED.   acetaminophen (TYLENOL) 325 MG tablet Take 2 tablets (650 mg total) by mouth every  6 (six) hours as needed for mild pain or headache.   albuterol (PROAIR HFA) 108 (90 Base) MCG/ACT inhaler 1-2 puffs every 6 hours as needed wheezing or shortness of breath.   aspirin EC 81 MG tablet Take 1 tablet (81 mg total) by mouth daily.   atorvastatin (LIPITOR) 40 MG tablet TAKE 1 TABLET ONCE DAILY AT 6PM   buPROPion (WELLBUTRIN SR) 150 MG 12 hr tablet Take 1 tablet (150 mg total) by mouth daily with breakfast.   cetaphil (CETAPHIL) lotion Apply 1 application topically 2 (two) times daily. For rash. Apply after washing with [facility] shower gel and rinsing with clear warm water.   DULoxetine (CYMBALTA) 60 MG capsule TAKE 1 CAPSULE ONCE DAILY WITH LARGEST MEAL   fexofenadine (ALLEGRA) 180 MG tablet Take 1 tablet (180 mg total) by mouth daily. For allergy symptoms   fluocinonide-emollient (LIDEX-E) 0.05 % cream Apply 1 application topically 2 (two) times daily. To affected areas   fluticasone (FLONASE) 50 MCG/ACT nasal spray Place 2 sprays into both nostrils daily as needed for allergies or rhinitis.   fluticasone furoate-vilanterol (BREO ELLIPTA) 100-25 MCG/INH AEPB Inhale 1 puff into the lungs daily.   Ipratropium-Albuterol (COMBIVENT RESPIMAT) 20-100 MCG/ACT AERS respimat Inhale 1 puff into the lungs every 6 (six) hours   isosorbide mononitrate (IMDUR) 60 MG 24 hr tablet TAKE 1 TABLET DAILY   metoprolol tartrate (LOPRESSOR) 25 MG tablet TAKE (1) TABLET TWICE A DAY.   mirtazapine (REMERON) 15 MG tablet TAKE 1 TABLET AT BEDTIME AS NEEDED FOR SLEEP   nitroGLYCERIN (NITROSTAT) 0.4 MG SL tablet Place 1 tablet (0.4 mg total) under the tongue every 5 (five) minutes  x 3 doses as needed for chest pain.   pantoprazole (PROTONIX) 40 MG tablet TAKE (1) TABLET TWICE A DAY.   tamsulosin (FLOMAX) 0.4 MG CAPS capsule TAKE (2) CAPSULES DAILY AFTER SUPPER.   Facility-Administered Encounter Medications as of 05/13/2021  Medication   cyanocobalamin ((VITAMIN B-12)) injection 1,000 mcg    Patient Active  Problem List   Diagnosis Date Noted   Back pain without sciatica 10/31/2020   Degenerative disc disease, lumbar 01/16/2019   Depression    Hypokalemia 06/27/2018   Seizure disorder ([place]) 02/07/2018   Apraxia of speech 10/13/2017   Left-sided weakness 10/13/2017   Coronary artery disease of native artery of native heart with stable angina pectoris ([place])    Illiteracy 10/23/2016   Carpal tunnel syndrome of right wrist 06/15/2016   GAD (generalized anxiety disorder) 05/01/2016   History of MI (myocardial infarction) 02/26/2015   Dyslipidemia, goal LDL below 70 10/08/2009   Infantile cerebral palsy ([place]) 10/08/2009   Essential hypertension 10/08/2009    Conditions to be addressed/monitored: monitor client management of anxiety and depression issues  Care Plan : [name]  Updates made by [name], [name] since 05/13/2021 12:00 AM     Problem: Emotional Distress      Goal: Emotional Health Supported;Manage ADLs; Manage depression issues   Start Date: 05/13/2021  Expected End Date: 08/07/2021  This Visit's Progress: On track  Recent Progress: On track  Priority: Medium  Note:   Current Barriers:  Chronic Mental Health needs related to depression management and anxiety management Mobility issues Pain issues Illiteracy Suicidal Ideation/Homicidal Ideation: No  Clinical Social Work Goal(s):  patient will work with SW monthly by telephone or in person to reduce or manage symptoms related to depression and anxiety issues Patient will call RNCM or [name] as needed in next 30 days for CCM support Patient will communicate with [name] in next 30 days to discuss mobility issues of client and to discuss pain issues of client  Interventions: 1:1 collaboration with [name], MD regarding development and update of comprehensive plan of care as evidenced by provider attestation and co-signature Discussed next client appointment for  B12 injection at [facility]. Client is scheduled  for B12 injection at [facility] on 05/19/21 at 10:30 AM.  [name] called [name] today and confirmed that [name]  will transport client to and from his B12 injection appointment at [facility] on 05/19/21. [name] informed client that transportation had been arranged by [name] to transport client to and from his next B12 appointment at [facility] on 05/19/21. Pick up time for client for transport is 10:00 AM on 05/19/21. Client is aware of this information.  Discussed food supply of client. [place]  has adequate food supply and is eating well. Discussed support of client from home health aide.Home health aide is providing support to client weekly as scheduled. Discussed client mobility. Client is using cane to help him walk. Home health aide and Johndaniel have been walking outdoors occasionally to help client maintain strength in ambulation.  Encouraged client to call RNCM as needed in next 30 days for nursing support for client Discussed client request for Lift Chair.  Order was completed previously for Lift Chair and sent to [employer].  Client was informed that he would need to pay at least $1,000.00 out of his pocket to obtain Lift Chair. Client is not able to afford Client is having more mobility challenges. He is at risk for falls. He seems to be more weak when walking.  Patient Self Care Activities:  Self administers medications as prescribed Attends all scheduled provider appointments Performs ADL's independently  Patient Coping Strengths:  Family  Patient Self Care Deficits:  Mobility issues Needs some help occasionally with in home care needs  Patient Goals:  - spend time or talk with others at least 2 to 3 times per week - practice relaxation or meditation daily - keep a calendar with appointment dates  Follow Up Plan: [name] to call client on 06/19/21 at 9:00 AM to assess needs of client. [name].Lujuana Kapler MSW, [place] Holiday representative [facility] Care Management [phone]

## 2021-05-13 NOTE — Patient Instructions (Addendum)
Visit Information  Patient goals: Manage emotions. Manage Depression issues. Complete ADLs daily with assistance as needed  Timeframe:  Short-Term Goal Priority:  Medium Progress: On Track Start Date:    05/13/21                     Expected End Date:    08/05/21             Follow Up Date  06/19/21 at 9:00 AM    Manage emotions. Manage depression issues . Complete ADLs daily with assistance as needed   Why is this important?   When you are stressed, down or upset, your body reacts too.  For example, your blood pressure may get higher; you may have a headache or stomachache.  When your emotions get the best of you, your body's ability to fight off cold and flu gets weak.  These steps will help you manage your emotions.     Patient Self Care Activities:  Self administers medications as prescribed Attends all scheduled provider appointments  Patient Coping Strengths:  Family Friends  Patient Self Care Deficits:  Mobility issues Depression issues Anxiety issues  Patient Goals:  - spend time or talk with others at least 2 to 3 times per week - practice relaxation or meditation daily - keep a calendar with appointment dates  Follow Up Plan: [name] to call client on  06/19/21 at 9:00 AM to assess client needs   Colburn Asper.Aoki Wedemeyer MSW, [place] Holiday representative [facility] Care Management [phone]

## 2021-05-16 ENCOUNTER — Ambulatory Visit: Payer: Medicare Other | Admitting: *Deleted

## 2021-05-16 DIAGNOSIS — R531 Weakness: Secondary | ICD-10-CM

## 2021-05-16 DIAGNOSIS — G809 Cerebral palsy, unspecified: Secondary | ICD-10-CM

## 2021-05-16 DIAGNOSIS — I25118 Atherosclerotic heart disease of native coronary artery with other forms of angina pectoris: Secondary | ICD-10-CM

## 2021-05-16 DIAGNOSIS — I10 Essential (primary) hypertension: Secondary | ICD-10-CM

## 2021-05-16 NOTE — Patient Instructions (Signed)
Visit Information  Patient Goals/Self-Care Activities: Take medications as prescribed   Call pharmacy for medication refills 3-7 days in advance of running out of medications Call provider office for new concerns or questions  Patient/in-home care aid will work with Social Services office regarding CAPS application Patient will call CCM team member for additional assistance or questions regarding application (RNCM [phone]) Patient will increase activity level as tolerated  Patient will use cane/assistive devices for ambulation Patient will move carefully and change positions slowly to avoid falls Patient will keep cell phone with him at all times  Check with [phone] and [employer] for exercise equipment, like pedal bike, that can be used while seated  Patient verbalizes understanding of instructions and care plan provided today and agrees to view in [place]. Active MyChart status confirmed with patient.    Plan:Telephone follow up appointment with care management team member scheduled for:  06/17/21 with RNCM The patient has been provided with contact information for the care management team and has been advised to call with any health related questions or concerns.   [name], BSN, RN-BC Embedded Chronic Care Manager [facility] / [place] Management Direct Dial: [phone]

## 2021-05-16 NOTE — Chronic Care Management (AMB) (Signed)
Chronic Care Management   CCM RN Visit Note  05/16/2021 Name: [name] MRN: [id number] DOB: 10/02/1955  Subjective: [name] is a 66 y.o. year old male who is a primary care patient of [name], [name], MD. The care management team was consulted for assistance with disease management and care coordination needs.    Engaged with patient by telephone for follow up visit in response to provider referral for case management and/or care coordination services.   Consent to Services:  The patient was given information about Chronic Care Management services, agreed to services, and gave verbal consent prior to initiation of services.  Please see initial visit note for detailed documentation.   Patient agreed to services and verbal consent obtained.   Assessment: Review of patient past medical history, allergies, medications, health status, including review of consultants reports, laboratory and other test data, was performed as part of comprehensive evaluation and provision of chronic care management services.   SDOH (Social Determinants of Health) assessments and interventions performed:    CCM Care Plan  Allergies  Allergen Reactions   Chantix [Varenicline Tartrate] Nausea And Vomiting    Outpatient Encounter Medications as of 05/16/2021  Medication Sig   celecoxib (CELEBREX) 200 MG capsule TAKE (1) CAPSULE DAILY AS NEEDED.   acetaminophen (TYLENOL) 325 MG tablet Take 2 tablets (650 mg total) by mouth every 6 (six) hours as needed for mild pain or headache.   albuterol (PROAIR HFA) 108 (90 Base) MCG/ACT inhaler 1-2 puffs every 6 hours as needed wheezing or shortness of breath.   aspirin EC 81 MG tablet Take 1 tablet (81 mg total) by mouth daily.   atorvastatin (LIPITOR) 40 MG tablet TAKE 1 TABLET ONCE DAILY AT 6PM   buPROPion (WELLBUTRIN SR) 150 MG 12 hr tablet Take 1 tablet (150 mg total) by mouth daily with breakfast.   cetaphil (CETAPHIL) lotion Apply 1 application topically  2 (two) times daily. For rash. Apply after washing with [facility] shower gel and rinsing with clear warm water.   DULoxetine (CYMBALTA) 60 MG capsule TAKE 1 CAPSULE ONCE DAILY WITH LARGEST MEAL   fexofenadine (ALLEGRA) 180 MG tablet Take 1 tablet (180 mg total) by mouth daily. For allergy symptoms   fluocinonide-emollient (LIDEX-E) 0.05 % cream Apply 1 application topically 2 (two) times daily. To affected areas   fluticasone (FLONASE) 50 MCG/ACT nasal spray Place 2 sprays into both nostrils daily as needed for allergies or rhinitis.   fluticasone furoate-vilanterol (BREO ELLIPTA) 100-25 MCG/INH AEPB Inhale 1 puff into the lungs daily.   Ipratropium-Albuterol (COMBIVENT RESPIMAT) 20-100 MCG/ACT AERS respimat Inhale 1 puff into the lungs every 6 (six) hours   isosorbide mononitrate (IMDUR) 60 MG 24 hr tablet TAKE 1 TABLET DAILY   metoprolol tartrate (LOPRESSOR) 25 MG tablet TAKE (1) TABLET TWICE A DAY.   mirtazapine (REMERON) 15 MG tablet TAKE 1 TABLET AT BEDTIME AS NEEDED FOR SLEEP   nitroGLYCERIN (NITROSTAT) 0.4 MG SL tablet Place 1 tablet (0.4 mg total) under the tongue every 5 (five) minutes x 3 doses as needed for chest pain.   pantoprazole (PROTONIX) 40 MG tablet TAKE (1) TABLET TWICE A DAY.   tamsulosin (FLOMAX) 0.4 MG CAPS capsule TAKE (2) CAPSULES DAILY AFTER SUPPER.   Facility-Administered Encounter Medications as of 05/16/2021  Medication   cyanocobalamin ((VITAMIN B-12)) injection 1,000 mcg    Patient Active Problem List   Diagnosis Date Noted   Back pain without sciatica 10/31/2020   Degenerative disc disease, lumbar 01/16/2019  Depression    Hypokalemia 06/27/2018   Seizure disorder ([place]) 02/07/2018   Apraxia of speech 10/13/2017   Left-sided weakness 10/13/2017   Coronary artery disease of native artery of native heart with stable angina pectoris ([place])    Illiteracy 10/23/2016   Carpal tunnel syndrome of right wrist 06/15/2016   GAD (generalized anxiety disorder)  05/01/2016   History of MI (myocardial infarction) 02/26/2015   Dyslipidemia, goal LDL below 70 10/08/2009   Infantile cerebral palsy ([place]) 10/08/2009   Essential hypertension 10/08/2009    Conditions to be addressed/monitored:CAD, HTN, and infantile cerebral palsy  Care Plan : [facility] Care Plan  Updates made by [name], RN since 05/16/2021 12:00 AM     Problem: Chronic Disease Management Needs   Priority: Medium     Long-Range Goal: Patient will Work With Consulting civil engineer Regarding Care Management and Care Coordination Associated with Chronic Medical Conditions   This Visit's Progress: On track  Recent Progress: On track  Priority: High  Note:   Current Barriers:  Care Coordination needs related to Transportation, Level of care concerns, ADL IADL limitations, Family and relationship dysfunction, Social Isolation, Literacy concerns, Inability to perform ADL's independently, and Inability to perform IADL's independently  Chronic Disease Management support and education needs related to CAD, HTN, Pulmonary Disease, and Cerebral Palsy Lacks caregiver support.  Unable to independently manage his medical conditions Transportation needs Limited mobility  RNCM Clinical Goal(s):  Patient will take all medications exactly as prescribed and will call provider for medication related questions continue to work with RN Care Manager to address care management and care coordination needs related to CAD, HTN, Pulmonary Disease, and cerebral palsy  work with Education officer, museum to address Limited social support, Transportation, Level of care concerns, ADL IADL limitations, Social Isolation, Literacy concerns, Cognitive Deficits, and Memory Deficits related to the management of cerebral palsy  through collaboration with Consulting civil engineer, provider, and care team.   Interventions: 1:1 collaboration with primary care provider regarding development and update of comprehensive plan of care as evidenced by  provider attestation and co-signature Inter-disciplinary care team collaboration (see longitudinal plan of care) Evaluation of current treatment plan related to  self management and patient's adherence to plan as established by provider Provided with RNCM contact number and encouraged to reach out as needed   SDOH Barriers (Status: Goal on track: YES.)  Discussed prior applications for additional hours for PCS through [employer]. Previously spoke with patient and his in-home care Aid regarding PCS hours  He does not qualify for any additional PCS hours unless he has dementia/alzheimer's But aid does does a significant amount to help with ADLs and IADLs Previously collaborated with [facility] Clinical staff and [name] regarding CAPS application Confirmed that Aid was able to pickup completed application from PCP office yesterday Discussed patient's need for additional aid hours and what all she assists him with   Hypertension: (Status: Goal on Track (progressing): YES.) Last practice recorded BP readings:  BP Readings from Last 3 Encounters:  01/31/21 130/76  10/31/20 114/64  07/17/20 118/78   Evaluation of current treatment plan related to hypertension and patient's adherence to plan as established by provider. Reviewed medications Medications are prepackaged for the month and delivered by [facility] Patient is compliant with medications No problems with affordability. Has Medicaid secondary which covers medication cost. Advised patient to check and record blood pressure daily and as needed Advised to call PCP with any blood pressure readings that are outside of  recommended range Bring log to PCP and cardiology appointments Recommended DASH diet Reviewed upcoming appointments Discussed transportation through [name]   CAD  (Status: Condition stable. Not addressed this visit.) Assessed understanding of CAD diagnosis Medications reviewed including medications utilized in CAD  treatment plan Evaluation of current treatment plan related to CAD and patient's adherence to plan as established by provider. Has not had to take nitro "in a while" Reviewed s/s of a heart attack No chest pain or pressure No shortness of breath Encouraged increasing physical activity as tolerated Patient feels weak after walking short distances. He does try to walk some in his home. Encouraged to slowly increase the distance he walks in his home  Fall prevention discussed Discussed diet Has mostly frozen meals for convenience Reinforced need to limit fried, fatty, and sugary foods and to eat vegetables, fruits, and lean proteins Reinforced to call 911 if he has any s/s of a heart attack   Cerebral Palsy:  (Status: Goal on Track (progressing): YES.) Family/social support discussed Patient has two sisters but they are not very involved in his care. He may talk with them once a month by telephone.  Discussed Personal Care Services [facility]) Has an aid, Theadora Rama, that comes Mon-Fri for a few hours each day. Totals 80 hrs a month. [place] [phone]) Theadora Rama assists with ADLs, bill payment, grocery shopping, and transport to appointments He feels very comfortable with her and says that if it wasn't for her, he wouldn't be able to care for himself Additional hours through [facility] were denied by he is applying for CAP services Fall assessment complete No recent falls but legs do feel weak and "wobbly" after walking a short distance Discussed fall prevention and safety and encouraged to keep cell phone with him at all times Discussed mobility and ambulation Does feel weak when walking. Can't walk very far.  Using a cane Encouraged to do physical therapy exercises that he was given months ago Encouraged to slowly increase the distance he walks inside of his home to build up stamina and strength Encouraged to walk/exercise when he's not at home alone Discussed how sedentary he  is and if he doesn't move around more, he'll just continue to decondition and physically decline Patient mentioned possibly purchasing a pedal bike that he can use while sitting. Encouraged this and recommended he check the [employer] and [phone] in [place]. Also can purchase online for around $25 if he has that ability. Provided lots of encouragement Therapeutic listening utilized regarding physical struggles, general health, and family/social dynamics   B12 Deficiency:  (Status: Goal on Track (progressing): YES.) Reviewed recent office notes and lab results with patient No recent B12 levels Provider did recommend that  he continue monthly B12 injections Reviewed upcoming appointment and most recent [name] notes Advised patient that transportation has been arranged through [name] for his appointment on 1/30.   Patient Goals/Self-Care Activities: Take medications as prescribed   Call pharmacy for medication refills 3-7 days in advance of running out of medications Call provider office for new concerns or questions  Patient/in-home care aid will work with Social Services office regarding CAPS application Patient will call CCM team member for additional assistance or questions regarding application (RNCM [phone]) Patient will increase activity level as tolerated  Patient will use cane/assistive devices for ambulation Patient will move carefully and change positions slowly to avoid falls Patient will keep cell phone with him at all times  Check with [phone] and [employer]  for exercise equipment, like pedal bike, that can be used while seated  Plan:Telephone follow up appointment with care management team member scheduled for:  06/17/21 with RNCM The patient has been provided with contact information for the care management team and has been advised to call with any health related questions or concerns.   [name], BSN, RN-BC Embedded Chronic Care Manager [facility] / [place] Management Direct Dial: [phone]

## 2021-05-19 ENCOUNTER — Ambulatory Visit (INDEPENDENT_AMBULATORY_CARE_PROVIDER_SITE_OTHER): Payer: Medicare Other | Admitting: *Deleted

## 2021-05-19 DIAGNOSIS — E538 Deficiency of other specified B group vitamins: Secondary | ICD-10-CM | POA: Diagnosis not present

## 2021-05-19 NOTE — Progress Notes (Signed)
Pt given B12 injection IM right deltoid and tolerated well. 

## 2021-05-20 DIAGNOSIS — F32A Depression, unspecified: Secondary | ICD-10-CM

## 2021-05-20 DIAGNOSIS — J441 Chronic obstructive pulmonary disease with (acute) exacerbation: Secondary | ICD-10-CM | POA: Diagnosis not present

## 2021-05-20 DIAGNOSIS — E785 Hyperlipidemia, unspecified: Secondary | ICD-10-CM | POA: Diagnosis not present

## 2021-05-20 DIAGNOSIS — I252 Old myocardial infarction: Secondary | ICD-10-CM

## 2021-05-20 DIAGNOSIS — I25118 Atherosclerotic heart disease of native coronary artery with other forms of angina pectoris: Secondary | ICD-10-CM

## 2021-05-20 DIAGNOSIS — I10 Essential (primary) hypertension: Secondary | ICD-10-CM | POA: Diagnosis not present

## 2021-05-20 DIAGNOSIS — I209 Angina pectoris, unspecified: Secondary | ICD-10-CM

## 2021-06-04 ENCOUNTER — Other Ambulatory Visit: Payer: Self-pay | Admitting: Family Medicine

## 2021-06-04 DIAGNOSIS — F411 Generalized anxiety disorder: Secondary | ICD-10-CM

## 2021-06-04 DIAGNOSIS — F3341 Major depressive disorder, recurrent, in partial remission: Secondary | ICD-10-CM

## 2021-06-06 ENCOUNTER — Other Ambulatory Visit: Payer: Self-pay | Admitting: Family Medicine

## 2021-06-06 DIAGNOSIS — I10 Essential (primary) hypertension: Secondary | ICD-10-CM

## 2021-06-06 DIAGNOSIS — I25118 Atherosclerotic heart disease of native coronary artery with other forms of angina pectoris: Secondary | ICD-10-CM

## 2021-06-06 DIAGNOSIS — I209 Angina pectoris, unspecified: Secondary | ICD-10-CM

## 2021-06-17 ENCOUNTER — Ambulatory Visit (INDEPENDENT_AMBULATORY_CARE_PROVIDER_SITE_OTHER): Payer: Medicare Other | Admitting: *Deleted

## 2021-06-17 DIAGNOSIS — G809 Cerebral palsy, unspecified: Secondary | ICD-10-CM

## 2021-06-17 DIAGNOSIS — I10 Essential (primary) hypertension: Secondary | ICD-10-CM | POA: Diagnosis not present

## 2021-06-17 DIAGNOSIS — R531 Weakness: Secondary | ICD-10-CM

## 2021-06-17 NOTE — Patient Instructions (Signed)
Visit Information  Patient Goals/Self-Care Activities: Take medications as prescribed   Call pharmacy for medication refills 3-7 days in advance of running out of medications Call provider office for new concerns or questions  Patient/in-home care aid will work with Social Services office regarding CAPS application Patient will call CCM team member for additional assistance or questions regarding application (RNCM [phone]) Patient will increase activity level as tolerated  Patient will use cane/assistive devices for ambulation Patient will move carefully and change positions slowly to avoid falls Patient will keep cell phone with him at all times  Check with [phone] and [employer] for exercise equipment, like pedal bike, that can be used while seated  Patient verbalizes understanding of instructions and care plan provided today and agrees to view in [place]. Active MyChart status confirmed with patient.    Plan:Telephone follow up appointment with care management team member scheduled for:  07/15/21 with RNCM The patient has been provided with contact information for the care management team and has been advised to call with any health related questions or concerns.   [name], BSN, RN-BC Embedded Chronic Care Manager [facility] / [place] Management Direct Dial: [phone]

## 2021-06-17 NOTE — Chronic Care Management (AMB) (Signed)
Chronic Care Management   CCM RN Visit Note  06/17/2021 Name: [name] MRN: [id number] DOB: 01/29/1956  Subjective: [name] is a 66 y.o. year old male who is a primary care patient of [name], [name], MD. The care management team was consulted for assistance with disease management and care coordination needs.    Engaged with patient by telephone for follow up visit in response to provider referral for case management and/or care coordination services.   Consent to Services:  The patient was given information about Chronic Care Management services, agreed to services, and gave verbal consent prior to initiation of services.  Please see initial visit note for detailed documentation.   Patient agreed to services and verbal consent obtained.   Assessment: Review of patient past medical history, allergies, medications, health status, including review of consultants reports, laboratory and other test data, was performed as part of comprehensive evaluation and provision of chronic care management services.   SDOH (Social Determinants of Health) assessments and interventions performed:    CCM Care Plan  Allergies  Allergen Reactions   Chantix [Varenicline Tartrate] Nausea And Vomiting    Outpatient Encounter Medications as of 06/17/2021  Medication Sig   acetaminophen (TYLENOL) 325 MG tablet Take 2 tablets (650 mg total) by mouth every 6 (six) hours as needed for mild pain or headache.   albuterol (PROAIR HFA) 108 (90 Base) MCG/ACT inhaler 1-2 puffs every 6 hours as needed wheezing or shortness of breath.   aspirin EC 81 MG tablet Take 1 tablet (81 mg total) by mouth daily.   atorvastatin (LIPITOR) 40 MG tablet TAKE 1 TABLET ONCE DAILY AT 6PM   buPROPion (WELLBUTRIN SR) 150 MG 12 hr tablet TAKE (1) TABLET DAILY WITH BREAKFAST.   celecoxib (CELEBREX) 200 MG capsule TAKE (1) CAPSULE DAILY AS NEEDED.   cetaphil (CETAPHIL) lotion Apply 1 application topically 2 (two) times daily.  For rash. Apply after washing with [facility] shower gel and rinsing with clear warm water.   DULoxetine (CYMBALTA) 60 MG capsule TAKE 1 CAPSULE ONCE DAILY WITH LARGEST MEAL   fexofenadine (ALLEGRA) 180 MG tablet Take 1 tablet (180 mg total) by mouth daily. For allergy symptoms   fluocinonide-emollient (LIDEX-E) 0.05 % cream Apply 1 application topically 2 (two) times daily. To affected areas   fluticasone (FLONASE) 50 MCG/ACT nasal spray Place 2 sprays into both nostrils daily as needed for allergies or rhinitis.   fluticasone furoate-vilanterol (BREO ELLIPTA) 100-25 MCG/INH AEPB Inhale 1 puff into the lungs daily.   Ipratropium-Albuterol (COMBIVENT RESPIMAT) 20-100 MCG/ACT AERS respimat Inhale 1 puff into the lungs every 6 (six) hours   isosorbide mononitrate (IMDUR) 60 MG 24 hr tablet TAKE 1 TABLET DAILY   metoprolol tartrate (LOPRESSOR) 25 MG tablet TAKE (1) TABLET TWICE A DAY.   mirtazapine (REMERON) 15 MG tablet TAKE 1 TABLET AT BEDTIME AS NEEDED FOR SLEEP   nitroGLYCERIN (NITROSTAT) 0.4 MG SL tablet Place 1 tablet (0.4 mg total) under the tongue every 5 (five) minutes x 3 doses as needed for chest pain.   pantoprazole (PROTONIX) 40 MG tablet TAKE (1) TABLET TWICE A DAY.   tamsulosin (FLOMAX) 0.4 MG CAPS capsule TAKE (2) CAPSULES DAILY AFTER SUPPER.   Facility-Administered Encounter Medications as of 06/17/2021  Medication   cyanocobalamin ((VITAMIN B-12)) injection 1,000 mcg    Patient Active Problem List   Diagnosis Date Noted   Back pain without sciatica 10/31/2020   Degenerative disc disease, lumbar 01/16/2019   Depression  Hypokalemia 06/27/2018   Seizure disorder ([place]) 02/07/2018   Apraxia of speech 10/13/2017   Left-sided weakness 10/13/2017   Coronary artery disease of native artery of native heart with stable angina pectoris ([place])    Illiteracy 10/23/2016   Carpal tunnel syndrome of right wrist 06/15/2016   GAD (generalized anxiety disorder) 05/01/2016   History of  MI (myocardial infarction) 02/26/2015   Dyslipidemia, goal LDL below 70 10/08/2009   Infantile cerebral palsy ([place]) 10/08/2009   Essential hypertension 10/08/2009    Conditions to be addressed/monitored:HTN and cerebral palsy, high fall risk  Care Plan : [facility] Care Plan  Updates made by [name], RN since 06/17/2021 12:00 AM     Problem: Chronic Disease Management Needs   Priority: Medium     Long-Range Goal: Patient will Work With Consulting civil engineer Regarding Care Management and Care Coordination Associated with Chronic Medical Conditions   This Visit's Progress: On track  Recent Progress: On track  Priority: High  Note:   Current Barriers:  Care Coordination needs related to Transportation, Level of care concerns, ADL IADL limitations, Family and relationship dysfunction, Social Isolation, Literacy concerns, Inability to perform ADL's independently, and Inability to perform IADL's independently  Chronic Disease Management support and education needs related to CAD, HTN, Pulmonary Disease, and Cerebral Palsy Lacks caregiver support.  Unable to independently manage his medical conditions Transportation needs Limited mobility  RNCM Clinical Goal(s):  Patient will take all medications exactly as prescribed and will call provider for medication related questions continue to work with RN Care Manager to address care management and care coordination needs related to CAD, HTN, Pulmonary Disease, and cerebral palsy  work with Education officer, museum to address Limited social support, Transportation, Level of care concerns, ADL IADL limitations, Social Isolation, Literacy concerns, Cognitive Deficits, and Memory Deficits related to the management of cerebral palsy  through collaboration with Consulting civil engineer, provider, and care team.   Interventions: 1:1 collaboration with primary care provider regarding development and update of comprehensive plan of care as evidenced by provider attestation and  co-signature Inter-disciplinary care team collaboration (see longitudinal plan of care) Evaluation of current treatment plan related to  self management and patient's adherence to plan as established by provider Provided with RNCM contact number and encouraged to reach out as needed   SDOH Barriers (Status: Goal on track: YES.)  Discussed prior applications for additional hours for PCS through [employer]. Previously spoke with patient and his in-home care Aid regarding PCS hours  He does not qualify for any additional PCS hours unless he has dementia/alzheimer's But aid does does a significant amount to help with ADLs and IADLs Collaborated with [name] regarding CAPS application Confirmed that Aid did pick up completed application and mailed it back in Patient is anxious that he hasn't heard anything yet Reminded patient that it has only been about 3 weeks and that they likely need more time to process the application Asked that he give it a little more time or reach out to [facility] [phone] which is the administrator for [place] services Discussed patient's need for additional aid hours and what all she assists him with   Hypertension: (Status: Goal on Track (progressing): YES.) Last practice recorded BP readings:  BP Readings from Last 3 Encounters:  01/31/21 130/76  10/31/20 114/64  07/17/20 118/78   Evaluation of current treatment plan related to hypertension and patient's adherence to plan as established by provider. Reviewed medications Medications are prepackaged for the  month and delivered by [facility] Patient is compliant with medications No problems with affordability. Has Medicaid secondary which covers medication cost. Encouraged to continue checking blood pressure at least 3 times per week and as needed Discussed that his aid assists with blood pressure checks Advised to call PCP with any blood pressure readings that are outside of  recommended range Bring log to PCP and cardiology appointments Recommended DASH diet Reviewed upcoming appointments Discussed transportation through [name]   CAD  (Status: Condition stable. Not addressed this visit.) Assessed understanding of CAD diagnosis Medications reviewed including medications utilized in CAD treatment plan Evaluation of current treatment plan related to CAD and patient's adherence to plan as established by provider. Has not had to take nitro "in a while" Reviewed s/s of a heart attack No chest pain or pressure No shortness of breath Encouraged increasing physical activity as tolerated Patient feels weak after walking short distances. He does try to walk some in his home. Encouraged to slowly increase the distance he walks in his home  Fall prevention discussed Discussed diet Has mostly frozen meals for convenience Reinforced need to limit fried, fatty, and sugary foods and to eat vegetables, fruits, and lean proteins Reinforced to call 911 if he has any s/s of a heart attack   Cerebral Palsy:  (Status: Goal on Track (progressing): YES.) Family/social support discussed Patient has two sisters but they are not very involved in his care. He may talk with them once a month by telephone.  Discussed Personal Care Services [facility]) Has an aid, Theadora Rama, that comes Mon-Fri for a few hours each day. Totals 80 hrs a month. [place] [phone]) Theadora Rama assists with ADLs, bill payment, grocery shopping, and transport to appointments He feels very comfortable with her and says that if it wasn't for her, he wouldn't be able to care for himself Additional hours through [facility] were denied by he has applied for CAP services. Pending response from them. Fall assessment complete One fall 4 days ago on the porch. No injury He was using his cane Left leg felt week and and gave way Discussed fall prevention and safety and encouraged to keep cell phone with him at  all times Discussed mobility and ambulation Does feel weak when walking. Can't walk very far.  Using a cane Encouraged to do physical therapy exercises that he was given months ago Encouraged to slowly increase the distance he walks inside of his home to build up stamina and strength Encouraged to walk/exercise when he's not at home alone Discussed how sedentary he is and if he doesn't move around more, he'll just continue to decondition and physically decline Discussed purchasing a pedal bike again, that he can use while sitting. Encouraged this and recommended he check the [employer] and [phone] in [place]. Also can purchase online for around $25 if he has that ability. Provided lots of encouragement Therapeutic listening utilized regarding physical struggles, general health, and family/social dynamics   B12 Deficiency:  (Status: Goal on Track (progressing): YES.) Reviewed recent office notes and lab results with patient No recent B12 levels Provider did recommend that  he continue monthly B12 injections Reviewed upcoming appointment and most recent [name] notes Discussed that his aid will be bringing him to his appointment this week   Patient Goals/Self-Care Activities: Take medications as prescribed   Call pharmacy for medication refills 3-7 days in advance of running out of medications Call provider office for new concerns or questions  Patient/in-home care  aid will work with [place] office regarding CAPS application Patient will call CCM team member for additional assistance or questions regarding application (RNCM [phone]) Patient will increase activity level as tolerated  Patient will use cane/assistive devices for ambulation Patient will move carefully and change positions slowly to avoid falls Patient will keep cell phone with him at all times  Check with [phone] and [employer] for exercise equipment, like pedal bike, that can be used while  seated  Plan:Telephone follow up appointment with care management team member scheduled for:  07/15/21 with [facility] The patient has been provided with contact information for the care management team and has been advised to call with any health related questions or concerns.   [name], BSN, RN-BC Embedded Chronic Care Manager [facility] / [place] [facility] Direct Dial: [phone]

## 2021-06-19 ENCOUNTER — Ambulatory Visit (INDEPENDENT_AMBULATORY_CARE_PROVIDER_SITE_OTHER): Payer: Medicare Other

## 2021-06-19 ENCOUNTER — Ambulatory Visit (INDEPENDENT_AMBULATORY_CARE_PROVIDER_SITE_OTHER): Payer: Medicare Other | Admitting: Licensed Clinical Social Worker

## 2021-06-19 DIAGNOSIS — I252 Old myocardial infarction: Secondary | ICD-10-CM

## 2021-06-19 DIAGNOSIS — J441 Chronic obstructive pulmonary disease with (acute) exacerbation: Secondary | ICD-10-CM

## 2021-06-19 DIAGNOSIS — E538 Deficiency of other specified B group vitamins: Secondary | ICD-10-CM

## 2021-06-19 DIAGNOSIS — G809 Cerebral palsy, unspecified: Secondary | ICD-10-CM

## 2021-06-19 DIAGNOSIS — R482 Apraxia: Secondary | ICD-10-CM

## 2021-06-19 DIAGNOSIS — F32A Depression, unspecified: Secondary | ICD-10-CM

## 2021-06-19 DIAGNOSIS — M5136 Other intervertebral disc degeneration, lumbar region: Secondary | ICD-10-CM

## 2021-06-19 DIAGNOSIS — E785 Hyperlipidemia, unspecified: Secondary | ICD-10-CM

## 2021-06-19 DIAGNOSIS — I10 Essential (primary) hypertension: Secondary | ICD-10-CM

## 2021-06-19 DIAGNOSIS — Z550 Illiteracy and low-level literacy: Secondary | ICD-10-CM

## 2021-06-19 DIAGNOSIS — F411 Generalized anxiety disorder: Secondary | ICD-10-CM

## 2021-06-19 DIAGNOSIS — I209 Angina pectoris, unspecified: Secondary | ICD-10-CM

## 2021-06-19 NOTE — Chronic Care Management (AMB) (Signed)
?Chronic Care Management  ? ? Clinical Social Work Note ? ?06/19/2021 ?Name: [name] MRN: [id number] DOB: 11/29/1955 ? ?[name] is a 66 y.o. year old male who is a primary care patient of [name], [name], MD. The CCM team was consulted to assist the patient with chronic disease management and/or care coordination needs related to: [name] .  ? ?Engaged with patient by telephone for follow up visit in response to provider referral for social work chronic care management and care coordination services.  ? ?Consent to Services:  ?The patient was given information about Chronic Care Management services, agreed to services, and gave verbal consent prior to initiation of services.  Please see initial visit note for detailed documentation.  ? ?Patient agreed to services and consent obtained.  ? ?Assessment: Review of patient past medical history, allergies, medications, and health status, including review of relevant consultants reports was performed today as part of a comprehensive evaluation and provision of chronic care management and care coordination services.    ? ?SDOH (Social Determinants of Health) assessments and interventions performed:  ?SDOH Interventions   ? ?Flowsheet Row Most Recent Value  ?SDOH Interventions   ?Physical Activity Interventions Other (Comments)  [walking challenges. he uses a cane to help him walk]  ?Stress Interventions Provide Counseling  [client has stress related to managing medical needs]  ?Depression Interventions/Treatment  Counseling  ? ?  ?  ? ?Advanced Directives Status: See [employer] application for related entries. ? ?CCM Care Plan ? ?Allergies  ?Allergen Reactions  ? Chantix [Varenicline Tartrate] Nausea And Vomiting  ? ? ?Outpatient Encounter Medications as of 06/19/2021  ?Medication Sig  ? acetaminophen (TYLENOL) 325 MG tablet Take 2 tablets (650 mg total) by mouth every 6 (six) hours as needed for mild pain or headache.  ? albuterol (PROAIR HFA) 108 (90 Base)  MCG/ACT inhaler 1-2 puffs every 6 hours as needed wheezing or shortness of breath.  ? aspirin EC 81 MG tablet Take 1 tablet (81 mg total) by mouth daily.  ? atorvastatin (LIPITOR) 40 MG tablet TAKE 1 TABLET ONCE DAILY AT 6PM  ? buPROPion (WELLBUTRIN SR) 150 MG 12 hr tablet TAKE (1) TABLET DAILY WITH BREAKFAST.  ? celecoxib (CELEBREX) 200 MG capsule TAKE (1) CAPSULE DAILY AS NEEDED.  ? cetaphil (CETAPHIL) lotion Apply 1 application topically 2 (two) times daily. For rash. Apply after washing with [facility] shower gel and rinsing with clear warm water.  ? DULoxetine (CYMBALTA) 60 MG capsule TAKE 1 CAPSULE ONCE DAILY WITH LARGEST MEAL  ? fexofenadine (ALLEGRA) 180 MG tablet Take 1 tablet (180 mg total) by mouth daily. For allergy symptoms  ? fluocinonide-emollient (LIDEX-E) 0.05 % cream Apply 1 application topically 2 (two) times daily. To affected areas  ? fluticasone (FLONASE) 50 MCG/ACT nasal spray Place 2 sprays into both nostrils daily as needed for allergies or rhinitis.  ? fluticasone furoate-vilanterol (BREO ELLIPTA) 100-25 MCG/INH AEPB Inhale 1 puff into the lungs daily.  ? Ipratropium-Albuterol (COMBIVENT RESPIMAT) 20-100 MCG/ACT AERS respimat Inhale 1 puff into the lungs every 6 (six) hours  ? isosorbide mononitrate (IMDUR) 60 MG 24 hr tablet TAKE 1 TABLET DAILY  ? metoprolol tartrate (LOPRESSOR) 25 MG tablet TAKE (1) TABLET TWICE A DAY.  ? mirtazapine (REMERON) 15 MG tablet TAKE 1 TABLET AT BEDTIME AS NEEDED FOR SLEEP  ? nitroGLYCERIN (NITROSTAT) 0.4 MG SL tablet Place 1 tablet (0.4 mg total) under the tongue every 5 (five) minutes x 3 doses as needed for chest  pain.  ? pantoprazole (PROTONIX) 40 MG tablet TAKE (1) TABLET TWICE A DAY.  ? tamsulosin (FLOMAX) 0.4 MG CAPS capsule TAKE (2) CAPSULES DAILY AFTER SUPPER.  ? ?Facility-Administered Encounter Medications as of 06/19/2021  ?Medication  ? cyanocobalamin ((VITAMIN B-12)) injection 1,000 mcg  ? ? ?Patient Active Problem List  ? Diagnosis Date Noted  ?  Back pain without sciatica 10/31/2020  ? Degenerative disc disease, lumbar 01/16/2019  ? Depression   ? Hypokalemia 06/27/2018  ? Seizure disorder ([place]) 02/07/2018  ? Apraxia of speech 10/13/2017  ? Left-sided weakness 10/13/2017  ? Coronary artery disease of native artery of native heart with stable angina pectoris ([place])   ? Illiteracy 10/23/2016  ? Carpal tunnel syndrome of right wrist 06/15/2016  ? GAD (generalized anxiety disorder) 05/01/2016  ? History of MI (myocardial infarction) 02/26/2015  ? Dyslipidemia, goal LDL below 70 10/08/2009  ? Infantile cerebral palsy ([place]) 10/08/2009  ? Essential hypertension 10/08/2009  ? ? ?Conditions to be addressed/monitored: monitor client management of anxiety issues and of depression issues ? ?Care Plan : [name]  ?Updates made by [name], [name] since 06/19/2021 12:00 AM  ?  ? ?Problem: Emotional Distress   ?  ? ?Goal: Emotional Health Supported;Manage ADLs; Manage depression issues   ?Start Date: 06/19/2021  ?Expected End Date: 09/18/2021  ?This Visit's Progress: On track  ?Recent Progress: On track  ?Priority: Medium  ?Note:   ?Current Barriers:  ?Chronic Mental Health needs related to depression management and anxiety management ?Mobility issues ?Pain issues ?Illiteracy ?Suicidal Ideation/Homicidal Ideation: No ? ?Clinical Social Work Goal(s):  ?patient will work with SW monthly by telephone or in person to reduce or manage symptoms related to depression and anxiety issues ?Patient will call RNCM or [name] as needed in next 30 days for CCM support ?Patient will communicate with [name] in next 30 days to discuss mobility issues of client and to discuss pain issues of client ? ?Interventions: ?1:1 collaboration with [name], MD regarding development and update of comprehensive plan of care as evidenced by provider attestation and co-signature ?Discussed client appointment for  B12 injection at [facility]. On 06/19/21 at 2:30 PM.  [name] reminded Joban that [name] Lucianne Lei  will transport him to and from appointment today and [name] Lucianne Lei will pick him up at his home at 2:00 PM. Client understood this information ?[name] talked with [name] about CAPs application for client. This application has been mailed in to CAPs and is being processed. [name] encouraged [place] to be patient during this process and informed him that it could take several weeks for application to be processed. Client is still currently receiving in home aid support for total of 18 hours per week. This in home support is very helpful to client.  Home health aide helps [place] with cooking , cleaning , bathing and self care, buying groceries and paying monthly bills ?Discussed ambulation of client. [place] said he walks daily to his mailbox and uses a cane to help him walk ?Encouraged client to call RNCM as needed for nursing support ?Provided counseling support for client.  ? ?Patient Self Care Activities:  ?Self administers medications as prescribed ?Attends all scheduled provider appointments ?Performs ADL's independently ? ?Patient Coping Strengths:  ?Family ? ?Patient Self Care Deficits:  ?Mobility issues ?Needs some help occasionally with in home care needs ? ?Patient Goals:  ?- spend time or talk with others at least 2 to 3 times per week ?- practice relaxation or meditation daily ?-  keep a calendar with appointment dates ? ?Follow Up Plan: [name] to call client on 08/13/21 at 10:00 AM to assess needs of client. .   ?  ?  ?[name].Reanne Nellums MSW, [name] ?Licensed Clinical Social Worker ?[facility] ?[phone] ?

## 2021-06-19 NOTE — Progress Notes (Signed)
Cyanocobalamin injection given to left deltoid.  Patient tolerated well. 

## 2021-06-19 NOTE — Patient Instructions (Addendum)
Visit Information ? ?Patient Goals:  Manage emotions. Manage depression issues. Complete ADLs daily with assistance as needed ? ?Timeframe:  Short-Term Goal ?Priority:  Medium ?Progress: On Track ?Start Date:    06/19/21                     ?Expected End Date:    09/18/21              ? ?Follow Up Date  08/13/21 at 10:00 AM  ?  ?Manage emotions. Manage depression issues . Complete ADLs daily with assistance as needed ?  ?Why is this important?   ?When you are stressed, down or upset, your body reacts too.  ?For example, your blood pressure may get higher; you may have a headache or stomachache.  ?When your emotions get the best of you, your body's ability to fight off cold and flu gets weak.  ?These steps will help you manage your emotions.    ? ?Patient Self Care Activities:  ?Self administers medications as prescribed ?Attends all scheduled provider appointments ? ?Patient Coping Strengths:  ?Family ?Friends ? ?Patient Self Care Deficits:  ?Mobility issues ?Depression issues ?Anxiety issues ? ?Patient Goals:  ?- spend time or talk with others at least 2 to 3 times per week ?- practice relaxation or meditation daily ?- keep a calendar with appointment dates ? ?Follow Up Plan: [name] to call client on  08/13/21 at 10:00 AM to assess client needs  ? ?[name].Caitlyne Ingham MSW, [name] ?Licensed Clinical Social Worker ?[facility] ?[phone] ?

## 2021-06-20 ENCOUNTER — Ambulatory Visit: Payer: Medicare Other

## 2021-07-02 ENCOUNTER — Other Ambulatory Visit: Payer: Self-pay | Admitting: Family Medicine

## 2021-07-02 DIAGNOSIS — I10 Essential (primary) hypertension: Secondary | ICD-10-CM

## 2021-07-02 DIAGNOSIS — I25118 Atherosclerotic heart disease of native coronary artery with other forms of angina pectoris: Secondary | ICD-10-CM

## 2021-07-02 DIAGNOSIS — I209 Angina pectoris, unspecified: Secondary | ICD-10-CM

## 2021-07-15 ENCOUNTER — Ambulatory Visit: Payer: Medicare Other | Admitting: *Deleted

## 2021-07-15 ENCOUNTER — Other Ambulatory Visit: Payer: Self-pay | Admitting: Family Medicine

## 2021-07-15 DIAGNOSIS — I10 Essential (primary) hypertension: Secondary | ICD-10-CM

## 2021-07-15 DIAGNOSIS — R531 Weakness: Secondary | ICD-10-CM

## 2021-07-15 DIAGNOSIS — I25118 Atherosclerotic heart disease of native coronary artery with other forms of angina pectoris: Secondary | ICD-10-CM

## 2021-07-15 DIAGNOSIS — I209 Angina pectoris, unspecified: Secondary | ICD-10-CM

## 2021-07-15 DIAGNOSIS — G809 Cerebral palsy, unspecified: Secondary | ICD-10-CM

## 2021-07-15 DIAGNOSIS — F3341 Major depressive disorder, recurrent, in partial remission: Secondary | ICD-10-CM

## 2021-07-15 DIAGNOSIS — G4701 Insomnia due to medical condition: Secondary | ICD-10-CM

## 2021-07-16 NOTE — Patient Instructions (Signed)
Visit Information ? ?Patient Goals/Self-Care Activities: ?Take medications as prescribed   ?Call pharmacy for medication refills 3-7 days in advance of running out of medications ?Call provider office for new concerns or questions  ?Patient/in-home care aid will work with Orth[facility] office regarding CAPS application ?Patient will call CCM team member for additional assistance or questions regarding application (RNCM [phone]) ?Patient will increase activity level as tolerated  ?Patient will use cane/assistive devices for ambulation ?Patient will move carefully and change positions slowly to avoid falls ?Patient will keep cell phone with him at all times  ?Call [name] [phone] to arrange transportation to appointment on 4/12 with Dr Phoebe Sharps inj ? ?Patient verbalizes understanding of instructions and care plan provided today and agrees to view in [place]. Active MyChart status confirmed with patient.   ? ?Plan:Telephone follow up appointment with care management team member scheduled for:  08/15/21 with RNCM ?The patient has been provided with contact information for the care management team and has been advised to call with any health related questions or concerns.  ? ?[name], BSN, RN-BC ?Embedded Chronic Care Manager ?[place] / [place] Management ?Direct Dial: [phone] ?  ?

## 2021-07-16 NOTE — Chronic Care Management (AMB) (Signed)
?Chronic Care Management  ? ?CCM RN Visit Note ? ?07/15/2021 ?Name: [name] MRN: [id number] DOB: 07-Aug-1955 ? ?Subjective: ?[name] is a 66 y.o. year old male who is a primary care patient of [name], [name], MD. The care management team was consulted for assistance with disease management and care coordination needs.   ? ?Engaged with patient by telephone for follow up visit in response to provider referral for case management and/or care coordination services.  ? ?Consent to Services:  ?The patient was given information about Chronic Care Management services, agreed to services, and gave verbal consent prior to initiation of services.  Please see initial visit note for detailed documentation.  ? ?Patient agreed to services and verbal consent obtained.  ? ?Assessment: Review of patient past medical history, allergies, medications, health status, including review of consultants reports, laboratory and other test data, was performed as part of comprehensive evaluation and provision of chronic care management services.  ? ?SDOH (Social Determinants of Health) assessments and interventions performed:   ? ?CCM Care Plan ? ?Allergies  ?Allergen Reactions  ? Chantix [Varenicline Tartrate] Nausea And Vomiting  ? ? ?Outpatient Encounter Medications as of 07/15/2021  ?Medication Sig  ? acetaminophen (TYLENOL) 325 MG tablet Take 2 tablets (650 mg total) by mouth every 6 (six) hours as needed for mild pain or headache.  ? albuterol (PROAIR HFA) 108 (90 Base) MCG/ACT inhaler 1-2 puffs every 6 hours as needed wheezing or shortness of breath.  ? aspirin EC 81 MG tablet Take 1 tablet (81 mg total) by mouth daily.  ? atorvastatin (LIPITOR) 40 MG tablet TAKE 1 TABLET ONCE DAILY AT 6PM  ? buPROPion (WELLBUTRIN SR) 150 MG 12 hr tablet TAKE (1) TABLET DAILY WITH BREAKFAST.  ? celecoxib (CELEBREX) 200 MG capsule TAKE (1) CAPSULE DAILY AS NEEDED.  ? cetaphil (CETAPHIL) lotion Apply 1 application topically 2 (two) times daily.  For rash. Apply after washing with [facility] shower gel and rinsing with clear warm water.  ? DULoxetine (CYMBALTA) 60 MG capsule TAKE 1 CAPSULE ONCE DAILY WITH LARGEST MEAL  ? fexofenadine (ALLEGRA) 180 MG tablet Take 1 tablet (180 mg total) by mouth daily. For allergy symptoms  ? fluocinonide-emollient (LIDEX-E) 0.05 % cream Apply 1 application topically 2 (two) times daily. To affected areas  ? fluticasone (FLONASE) 50 MCG/ACT nasal spray Place 2 sprays into both nostrils daily as needed for allergies or rhinitis.  ? fluticasone furoate-vilanterol (BREO ELLIPTA) 100-25 MCG/INH AEPB Inhale 1 puff into the lungs daily.  ? Ipratropium-Albuterol (COMBIVENT RESPIMAT) 20-100 MCG/ACT AERS respimat Inhale 1 puff into the lungs every 6 (six) hours  ? isosorbide mononitrate (IMDUR) 60 MG 24 hr tablet TAKE 1 TABLET DAILY  ? metoprolol tartrate (LOPRESSOR) 25 MG tablet TAKE (1) TABLET TWICE A DAY.  ? mirtazapine (REMERON) 15 MG tablet TAKE 1 TABLET AT BEDTIME AS NEEDED FOR SLEEP  ? nitroGLYCERIN (NITROSTAT) 0.4 MG SL tablet Place 1 tablet (0.4 mg total) under the tongue every 5 (five) minutes x 3 doses as needed for chest pain.  ? pantoprazole (PROTONIX) 40 MG tablet TAKE (1) TABLET TWICE A DAY.  ? tamsulosin (FLOMAX) 0.4 MG CAPS capsule TAKE (2) CAPSULES DAILY AFTER SUPPER.  ? ?Facility-Administered Encounter Medications as of 07/15/2021  ?Medication  ? cyanocobalamin ((VITAMIN B-12)) injection 1,000 mcg  ? ? ?Patient Active Problem List  ? Diagnosis Date Noted  ? Back pain without sciatica 10/31/2020  ? Degenerative disc disease, lumbar 01/16/2019  ? Depression   ?  Hypokalemia 06/27/2018  ? Seizure disorder ([place]) 02/07/2018  ? Apraxia of speech 10/13/2017  ? Left-sided weakness 10/13/2017  ? Coronary artery disease of native artery of native heart with stable angina pectoris ([place])   ? Illiteracy 10/23/2016  ? Carpal tunnel syndrome of right wrist 06/15/2016  ? GAD (generalized anxiety disorder) 05/01/2016  ? History of  MI (myocardial infarction) 02/26/2015  ? Dyslipidemia, goal LDL below 70 10/08/2009  ? Infantile cerebral palsy ([place]) 10/08/2009  ? Essential hypertension 10/08/2009  ? ? ?Conditions to be addressed/monitored:CAD, HTN, and Cerebral Palsy ? ?Care Plan : [facility] Care Plan  ?Updates made by [name], RN since 07/16/2021 12:00 AM  ?  ? ?Problem: Chronic Disease Management Needs   ?Priority: High  ?  ? ?Long-Range Goal: Patient will Work With [place] with Chronic Medical Conditions   ?This Visit's Progress: On track  ?Recent Progress: On track  ?Priority: High  ?Note:   ?Current Barriers:  ?Care Coordination needs related to Transportation, Level of care concerns, ADL IADL limitations, Family and relationship dysfunction, Social Isolation, Literacy concerns, Inability to perform ADL's independently, and Inability to perform IADL's independently  ?Chronic Disease Management support and education needs related to CAD, HTN, Pulmonary Disease, and Cerebral Palsy ?Lacks caregiver support.  ?Unable to independently manage his medical conditions ?Transportation needs ?Limited mobility ? ?RNCM Clinical Goal(s):  ?Patient will take all medications exactly as prescribed and will call provider for medication related questions ?continue to work with RN Care Manager to address care management and care coordination needs related to CAD, HTN, Pulmonary Disease, and cerebral palsy  ?work with Education officer, museum to address Limited social support, Transportation, Level of care concerns, ADL IADL limitations, Social Isolation, Literacy concerns, Cognitive Deficits, and Memory Deficits related to the management of cerebral palsy  through collaboration with [name], provider, and care team.  ? ?Interventions: ?1:1 collaboration with primary care provider regarding development and update of comprehensive plan of care as evidenced by provider attestation and  co-signature ?Inter-disciplinary care team collaboration (see longitudinal plan of care) ?Evaluation of current treatment plan related to  self management and patient's adherence to plan as established by provider ?Provided with RNCM contact number and encouraged to reach out as needed ? ? ?SDOH Barriers (Status: Goal on track: YES.)  ?Discussed prior applications for additional hours for PCS through [employer]. ?Previously spoke with patient and his in-home care Aid regarding PCS hours  ?He does not qualify for any additional PCS hours unless he has dementia/alzheimer's ?But aid does does a significant amount to help with ADLs and IADLs ?Collaborated with [name] regarding CAPS application ?Discussed that he hasn't heard anything regarding the CAPS application ?Asked that he give it a little more time or reach out to [employer] [phone] which is the administrator for [place] services ? ?  ?Hypertension: (Status: Condition stable. Not addressed this visit.) ?Last practice recorded BP readings:  ?BP Readings from Last 3 Encounters:  ?01/31/21 130/76  ?10/31/20 114/64  ?07/17/20 118/78  ? ?Evaluation of current treatment plan related to hypertension and patient's adherence to plan as established by provider. ?Reviewed medications ?Medications are prepackaged for the month and delivered by [facility] ?Patient is compliant with medications ?No problems with affordability. Has Medicaid secondary which covers medication cost. ?Encouraged to continue checking blood pressure at least 3 times per week and as needed ?Discussed that his aid assists with blood pressure checks ?Advised to call  PCP with any blood pressure readings that are outside of recommended range ?Bring log to PCP and cardiology appointments ?Recommended DASH diet ?Reviewed upcoming appointments ?Discussed transportation through [place] ? ? ?CAD  (Status: Condition stable. Not addressed this visit.) ?Assessed understanding of CAD  diagnosis ?Medications reviewed including medications utilized in CAD treatment plan ?Evaluation of current treatment plan related to CAD and patient's adherence to plan as established by provider. ?Has not had to take nitro "in a

## 2021-07-18 DIAGNOSIS — F32A Depression, unspecified: Secondary | ICD-10-CM | POA: Diagnosis not present

## 2021-07-18 DIAGNOSIS — I25118 Atherosclerotic heart disease of native coronary artery with other forms of angina pectoris: Secondary | ICD-10-CM

## 2021-07-18 DIAGNOSIS — I252 Old myocardial infarction: Secondary | ICD-10-CM | POA: Diagnosis not present

## 2021-07-18 DIAGNOSIS — I10 Essential (primary) hypertension: Secondary | ICD-10-CM | POA: Diagnosis not present

## 2021-07-18 DIAGNOSIS — I209 Angina pectoris, unspecified: Secondary | ICD-10-CM | POA: Diagnosis not present

## 2021-07-18 DIAGNOSIS — E785 Hyperlipidemia, unspecified: Secondary | ICD-10-CM | POA: Diagnosis not present

## 2021-07-18 DIAGNOSIS — J441 Chronic obstructive pulmonary disease with (acute) exacerbation: Secondary | ICD-10-CM

## 2021-07-21 ENCOUNTER — Ambulatory Visit: Payer: Medicare Other

## 2021-07-30 ENCOUNTER — Ambulatory Visit (INDEPENDENT_AMBULATORY_CARE_PROVIDER_SITE_OTHER): Payer: Medicare Other | Admitting: Licensed Clinical Social Worker

## 2021-07-30 ENCOUNTER — Encounter: Payer: Self-pay | Admitting: Family Medicine

## 2021-07-30 ENCOUNTER — Ambulatory Visit (INDEPENDENT_AMBULATORY_CARE_PROVIDER_SITE_OTHER): Payer: Medicare Other | Admitting: Family Medicine

## 2021-07-30 VITALS — BP 105/62 | HR 69 | Temp 97.7°F | Ht 73.0 in | Wt 258.2 lb

## 2021-07-30 DIAGNOSIS — I10 Essential (primary) hypertension: Secondary | ICD-10-CM

## 2021-07-30 DIAGNOSIS — I209 Angina pectoris, unspecified: Secondary | ICD-10-CM

## 2021-07-30 DIAGNOSIS — F411 Generalized anxiety disorder: Secondary | ICD-10-CM

## 2021-07-30 DIAGNOSIS — E785 Hyperlipidemia, unspecified: Secondary | ICD-10-CM

## 2021-07-30 DIAGNOSIS — J432 Centrilobular emphysema: Secondary | ICD-10-CM | POA: Diagnosis not present

## 2021-07-30 DIAGNOSIS — G809 Cerebral palsy, unspecified: Secondary | ICD-10-CM | POA: Diagnosis not present

## 2021-07-30 DIAGNOSIS — J441 Chronic obstructive pulmonary disease with (acute) exacerbation: Secondary | ICD-10-CM

## 2021-07-30 DIAGNOSIS — R5383 Other fatigue: Secondary | ICD-10-CM

## 2021-07-30 DIAGNOSIS — E538 Deficiency of other specified B group vitamins: Secondary | ICD-10-CM

## 2021-07-30 DIAGNOSIS — K219 Gastro-esophageal reflux disease without esophagitis: Secondary | ICD-10-CM

## 2021-07-30 DIAGNOSIS — I25118 Atherosclerotic heart disease of native coronary artery with other forms of angina pectoris: Secondary | ICD-10-CM

## 2021-07-30 DIAGNOSIS — M51369 Other intervertebral disc degeneration, lumbar region without mention of lumbar back pain or lower extremity pain: Secondary | ICD-10-CM

## 2021-07-30 DIAGNOSIS — I252 Old myocardial infarction: Secondary | ICD-10-CM

## 2021-07-30 DIAGNOSIS — Z550 Illiteracy and low-level literacy: Secondary | ICD-10-CM

## 2021-07-30 DIAGNOSIS — Z9181 History of falling: Secondary | ICD-10-CM

## 2021-07-30 DIAGNOSIS — M5136 Other intervertebral disc degeneration, lumbar region: Secondary | ICD-10-CM

## 2021-07-30 DIAGNOSIS — R482 Apraxia: Secondary | ICD-10-CM

## 2021-07-30 MED ORDER — ALBUTEROL SULFATE HFA 108 (90 BASE) MCG/ACT IN AERS: 2.0000 | INHALATION_SPRAY | Freq: Four times a day (QID) | RESPIRATORY_TRACT | 11 refills | Status: AC | PRN

## 2021-07-30 MED ORDER — TRELEGY ELLIPTA 100-62.5-25 MCG/ACT IN AEPB: 1.0000 | INHALATION_SPRAY | Freq: Every day | RESPIRATORY_TRACT | 11 refills | Status: DC

## 2021-07-30 NOTE — Patient Instructions (Addendum)
Visit Information ? ?Patient Goals: Manage emotions. Manage depression issues. Complete ADLs daily with assistance as needed ? ?imeframe:  Short-Term Goal ?Priority:  Medium ?Progress: On Track ?Start Date:    07/30/21                       ?Expected End Date:    10/24/21              ? ?Follow Up Date  08/13/21 at 10:00 AM  ?  ?Manage emotions. Manage depression issues . Complete ADLs daily with assistance as needed ?  ?Why is this important?   ?When you are stressed, down or upset, your body reacts too.  ?For example, your blood pressure may get higher; you may have a headache or stomachache.  ?When your emotions get the best of you, your body's ability to fight off cold and flu gets weak.  ?These steps will help you manage your emotions.    ? ?Patient Self Care Activities:  ?Self administers medications as prescribed ?Attends all scheduled provider appointments ? ?Patient Coping Strengths:  ?Family ?Friends ? ?Patient Self Care Deficits:  ?Mobility issues ?Depression issues ?Anxiety issues ? ?Patient Goals:  ?- spend time or talk with others at least 2 to 3 times per week ?- practice relaxation or meditation daily ?- keep a calendar with appointment dates ? ?Follow Up Plan: [name] to call client on  08/13/21 at 10:00 AM to assess client needs  ? ?[name].Preet Mangano MSW, [name] ?Licensed Clinical Social Worker ?[facility] ?[phone] ?

## 2021-07-30 NOTE — Progress Notes (Signed)
? ?Subjective:  ?Patient ID: [name], male    DOB: Jun 02, 1955  Age: 66 y.o. MRN: [id number] ? ?CC: Medical Management of Chronic Issues ? ? ?HPI ?Aadil Sur Schoenberg presents for follow-up of hypertension. Patient has no history of headache chest pain or shortness of breath or recent cough. Patient also denies symptoms of TIA such as numbness weakness lateralizing. Patient checks  blood pressure at home and has not had any elevated readings recently. Patient denies side effects from his medication. States taking it regularly. ?His cerebral palsy hemiplegia is stable.  He is living alone but he has an aide coming in.  The aide is out right now due to a kidney stone.  Overall the arrangement seems to be working well.  He was turned down for increased hours with the aid however. ? ?He is continuing to use his Breo inhaler but is having fairly frequent shortness of breath.  He is using his rescue inhaler almost daily.  He says he gets short of breath just walking about 30 to 50 feet. ? ?Patient in for follow-up of elevated cholesterol. Doing well without complaints on current medication. Denies side effects of statin including myalgia and arthralgia and nausea. Also in today for liver function testing. Currently no chest pain, shortness of breath or other cardiovascular related symptoms noted. ? ?Patient also has a history of vitamin B12 deficiency he is due to have that checked again today as well.  He is due for his injection to be done afterward.  No peripheral neuropathy symptoms noted currently. ? ? ?  07/30/2021  ? 10:14 AM 06/19/2021  ?  9:30 AM 05/13/2021  ? 10:59 AM  ?Depression screen PHQ 2/9  ?Decreased Interest 0 1 1  ?Down, Depressed, Hopeless 0 1 1  ?PHQ - 2 Score 0 2 2  ?Altered sleeping  1 1  ?Tired, decreased energy  1 1  ?Change in appetite  0 0  ?Feeling bad or failure about yourself   1 1  ?Trouble concentrating  1 2  ?Moving slowly or fidgety/restless  1 1  ?Suicidal thoughts  0 0  ?PHQ-9 Score  7 8   ?Difficult doing work/chores  Somewhat difficult Somewhat difficult  ? ? ?History ?Marvell has a past medical history of Cerebral palsy ([place]), Coronary artery disease, Depression, GERD (gastroesophageal reflux disease), Hypertension, Scoliosis, and Seizures ([place]).  ? ?He has a past surgical history that includes Coronary angioplasty with stent; Carpal tunnel release (Right, 08/13/2016); and LEFT HEART CATH AND CORONARY ANGIOGRAPHY (N/A, 07/10/2017).  ? ?His family history includes Alcohol abuse in his brother; CAD in his brother and sister; CAD (age of onset: 35) in his father; Diabetes in his mother; Heart disease in his father; Heart failure in his sister.He reports that he quit smoking about 3 years ago. His smoking use included cigarettes. He has a 20.50 pack-year smoking history. He has never used smokeless tobacco. He reports current alcohol use. He reports that he does not use drugs. ? ? ? ?ROS ?Review of Systems  ?Constitutional:  Negative for fever.  ?Respiratory:  Negative for shortness of breath.   ?Cardiovascular:  Negative for chest pain.  ?Musculoskeletal:  Negative for arthralgias.  ?Skin:  Negative for rash.  ? ?Objective:  ?Pulse 69   Temp 97.7 ?F (36.5 ?C)   Ht 6' 1" (1.854 m)   Wt 258 lb 3.2 oz (117.1 kg)   SpO2 95%   BMI 34.07 kg/m?  ? ?BP Readings from Last 3  Encounters:  ?01/31/21 130/76  ?10/31/20 114/64  ?07/17/20 118/78  ? ? ?Wt Readings from Last 3 Encounters:  ?07/30/21 258 lb 3.2 oz (117.1 kg)  ?01/31/21 254 lb 6.4 oz (115.4 kg)  ?10/31/20 256 lb (116.1 kg)  ? ? ? ?Physical Exam ?Vitals reviewed.  ?Constitutional:   ?   Appearance: He is well-developed.  ?HENT:  ?   Head: Normocephalic and atraumatic.  ?   Right Ear: External ear normal.  ?   Left Ear: External ear normal.  ?   Mouth/Throat:  ?   Pharynx: No oropharyngeal exudate or posterior oropharyngeal erythema.  ?Eyes:  ?   Pupils: Pupils are equal, round, and reactive to light.  ?Cardiovascular:  ?   Rate and Rhythm: Normal rate  and regular rhythm.  ?   Heart sounds: No murmur heard. ?Pulmonary:  ?   Effort: No respiratory distress.  ?   Breath sounds: Wheezing present.  ?Musculoskeletal:     ?   General: Deformity (LUE is atrophied) present.  ?   Cervical back: Normal range of motion and neck supple.  ?Neurological:  ?   Mental Status: He is alert and oriented to person, place, and time.  ? ? ? ? ?Assessment & Plan:  ? ?Demetres was seen today for medical management of chronic issues. ? ?Diagnoses and all orders for this visit: ? ?Centrilobular emphysema (HCC) ? ?Essential hypertension ?-     CBC with Differential/Platelet ?-     CMP14+EGFR ? ?Dyslipidemia, goal LDL below 70 ?-     Lipid panel ? ?Infantile cerebral palsy (HCC) ? ?B12 deficiency ?-     Vitamin B12 ? ?Other orders ?-     Fluticasone-Umeclidin-Vilant (TRELEGY ELLIPTA) 100-62.5-25 MCG/ACT AEPB; Inhale 1 puff into the lungs daily. ?-     albuterol (VENTOLIN HFA) 108 (90 Base) MCG/ACT inhaler; Inhale 2 puffs into the lungs every 6 (six) hours as needed for wheezing or shortness of breath. ? ? ? ? ? ? ?I have discontinued Angus J. Nachreiner's albuterol, Breo Ellipta, and Combivent Respimat. I am also having him start on Trelegy Ellipta and albuterol. Additionally, I am having him maintain his acetaminophen, cetaphil, aspirin EC, fexofenadine, fluocinonide-emollient, nitroGLYCERIN, fluticasone, tamsulosin, pantoprazole, atorvastatin, buPROPion, DULoxetine, celecoxib, metoprolol tartrate, isosorbide mononitrate, and mirtazapine. We administered cyanocobalamin. We will continue to administer cyanocobalamin. ? ?Allergies as of 07/30/2021   ? ?   Reactions  ? Chantix [varenicline Tartrate] Nausea And Vomiting  ? ?  ? ?  ?Medication List  ?  ? ?  ? Accurate as of July 30, 2021 11:40 AM. If you have any questions, ask your nurse or doctor.  ?  ?  ? ?  ? ?STOP taking these medications   ? ?Breo Ellipta 100-25 MCG/ACT Aepb ?Generic drug: fluticasone furoate-vilanterol ?Stopped by: Warren  [name], MD ?  ?Combivent Respimat 20-100 MCG/ACT Aers respimat ?Generic drug: Ipratropium-Albuterol ?Stopped by: Warren [name], MD ?  ? ?  ? ?TAKE these medications   ? ?acetaminophen 325 MG tablet ?Commonly known as: TYLENOL ?Take 2 tablets (650 mg total) by mouth every 6 (six) hours as needed for mild pain or headache. ?  ?albuterol 108 (90 Base) MCG/ACT inhaler ?Commonly known as: VENTOLIN HFA ?Inhale 2 puffs into the lungs every 6 (six) hours as needed for wheezing or shortness of breath. ?What changed:  ?how much to take ?how to take this ?when to take this ?reasons to take this ?additional instructions ?Changed by: Warren [name], MD ?  ?  aspirin EC 81 MG tablet ?Take 1 tablet (81 mg total) by mouth daily. ?  ?atorvastatin 40 MG tablet ?Commonly known as: LIPITOR ?TAKE 1 TABLET ONCE DAILY AT 6PM ?  ?buPROPion 150 MG 12 hr tablet ?Commonly known as: WELLBUTRIN SR ?TAKE (1) TABLET DAILY WITH BREAKFAST. ?  ?celecoxib 200 MG capsule ?Commonly known as: CELEBREX ?TAKE (1) CAPSULE DAILY AS NEEDED. ?  ?cetaphil lotion ?Apply 1 application topically 2 (two) times daily. For rash. Apply after washing with [facility] shower gel and rinsing with clear warm water. ?  ?DULoxetine 60 MG capsule ?Commonly known as: CYMBALTA ?TAKE 1 CAPSULE ONCE DAILY WITH LARGEST MEAL ?  ?fexofenadine 180 MG tablet ?Commonly known as: ALLEGRA ?Take 1 tablet (180 mg total) by mouth daily. For allergy symptoms ?  ?fluocinonide-emollient 0.05 % cream ?Commonly known as: LIDEX-E ?Apply 1 application topically 2 (two) times daily. To affected areas ?  ?fluticasone 50 MCG/ACT nasal spray ?Commonly known as: FLONASE ?Place 2 sprays into both nostrils daily as needed for allergies or rhinitis. ?  ?isosorbide mononitrate 60 MG 24 hr tablet ?Commonly known as: IMDUR ?TAKE 1 TABLET DAILY ?  ?metoprolol tartrate 25 MG tablet ?Commonly known as: LOPRESSOR ?TAKE (1) TABLET TWICE A DAY. ?  ?mirtazapine 15 MG tablet ?Commonly known as: REMERON ?TAKE 1  TABLET AT BEDTIME AS NEEDED FOR SLEEP ?  ?nitroGLYCERIN 0.4 MG SL tablet ?Commonly known as: NITROSTAT ?Place 1 tablet (0.4 mg total) under the tongue every 5 (five) minutes x 3 doses as needed for chest pain. ?  ?p

## 2021-07-30 NOTE — Chronic Care Management (AMB) (Signed)
?Chronic Care Management  ? ? Clinical Social Work Note ? ?07/30/2021 ?Name: [name] MRN: [id number] DOB: Sep 06, 1955 ? ?[name] is a 66 y.o. year old male who is a primary care patient of [name], [name], MD. The CCM team was consulted to assist the patient with chronic disease management and/or care coordination needs related to: [name] .  ? ?Engaged with patient by telephone for follow up visit in response to provider referral for social work chronic care management and care coordination services.  ? ?Consent to Services:  ?The patient was given information about Chronic Care Management services, agreed to services, and gave verbal consent prior to initiation of services.  Please see initial visit note for detailed documentation.  ? ?Patient agreed to services and consent obtained.  ? ?Assessment: Review of patient past medical history, allergies, medications, and health status, including review of relevant consultants reports was performed today as part of a comprehensive evaluation and provision of chronic care management and care coordination services.    ? ?SDOH (Social Determinants of Health) assessments and interventions performed:  ?SDOH Interventions   ? ?Flowsheet Row Most Recent Value  ?SDOH Interventions   ?Physical Activity Interventions Other (Comments)  [walking challenges. he uses a cane to help him walk]  ?Stress Interventions Provide Counseling  [client has stress related to managing in home care needs]  ?Depression Interventions/Treatment  Counseling  ? ?  ?  ? ?Advanced Directives Status: See Vynca application for related entries. ? ?CCM Care Plan ? ?Allergies  ?Allergen Reactions  ? Chantix [Varenicline Tartrate] Nausea And Vomiting  ? ? ?Outpatient Encounter Medications as of 07/30/2021  ?Medication Sig  ? acetaminophen (TYLENOL) 325 MG tablet Take 2 tablets (650 mg total) by mouth every 6 (six) hours as needed for mild pain or headache.  ? albuterol (VENTOLIN HFA) 108  (90 Base) MCG/ACT inhaler Inhale 2 puffs into the lungs every 6 (six) hours as needed for wheezing or shortness of breath.  ? aspirin EC 81 MG tablet Take 1 tablet (81 mg total) by mouth daily.  ? atorvastatin (LIPITOR) 40 MG tablet TAKE 1 TABLET ONCE DAILY AT 6PM  ? buPROPion (WELLBUTRIN SR) 150 MG 12 hr tablet TAKE (1) TABLET DAILY WITH BREAKFAST.  ? celecoxib (CELEBREX) 200 MG capsule TAKE (1) CAPSULE DAILY AS NEEDED.  ? cetaphil (CETAPHIL) lotion Apply 1 application topically 2 (two) times daily. For rash. Apply after washing with [facility] shower gel and rinsing with clear warm water.  ? DULoxetine (CYMBALTA) 60 MG capsule TAKE 1 CAPSULE ONCE DAILY WITH LARGEST MEAL  ? fexofenadine (ALLEGRA) 180 MG tablet Take 1 tablet (180 mg total) by mouth daily. For allergy symptoms  ? fluocinonide-emollient (LIDEX-E) 0.05 % cream Apply 1 application topically 2 (two) times daily. To affected areas  ? fluticasone (FLONASE) 50 MCG/ACT nasal spray Place 2 sprays into both nostrils daily as needed for allergies or rhinitis.  ? Fluticasone-Umeclidin-Vilant (TRELEGY ELLIPTA) 100-62.5-25 MCG/ACT AEPB Inhale 1 puff into the lungs daily.  ? isosorbide mononitrate (IMDUR) 60 MG 24 hr tablet TAKE 1 TABLET DAILY  ? metoprolol tartrate (LOPRESSOR) 25 MG tablet TAKE (1) TABLET TWICE A DAY.  ? mirtazapine (REMERON) 15 MG tablet TAKE 1 TABLET AT BEDTIME AS NEEDED FOR SLEEP  ? nitroGLYCERIN (NITROSTAT) 0.4 MG SL tablet Place 1 tablet (0.4 mg total) under the tongue every 5 (five) minutes x 3 doses as needed for chest pain.  ? pantoprazole (PROTONIX) 40 MG tablet TAKE (1) TABLET TWICE  A DAY.  ? tamsulosin (FLOMAX) 0.4 MG CAPS capsule TAKE (2) CAPSULES DAILY AFTER SUPPER.  ? ?Facility-Administered Encounter Medications as of 07/30/2021  ?Medication  ? cyanocobalamin ((VITAMIN B-12)) injection 1,000 mcg  ? ? ?Patient Active Problem List  ? Diagnosis Date Noted  ? Centrilobular emphysema ([place]) 07/30/2021  ? B12 deficiency 07/30/2021  ? Back  pain without sciatica 10/31/2020  ? Degenerative disc disease, lumbar 01/16/2019  ? Depression   ? Hypokalemia 06/27/2018  ? Seizure disorder ([place]) 02/07/2018  ? Apraxia of speech 10/13/2017  ? Left-sided weakness 10/13/2017  ? Coronary artery disease of native artery of native heart with stable angina pectoris ([place])   ? Illiteracy 10/23/2016  ? Carpal tunnel syndrome of right wrist 06/15/2016  ? GAD (generalized anxiety disorder) 05/01/2016  ? History of MI (myocardial infarction) 02/26/2015  ? Dyslipidemia, goal LDL below 70 10/08/2009  ? Infantile cerebral palsy ([place]) 10/08/2009  ? Essential hypertension 10/08/2009  ? ? ?Conditions to be addressed/monitored: monitor client management of anxiety issues and of depression issues ? ?Care Plan : [name]  ?Updates made by [name], [name] since 07/30/2021 12:00 AM  ?  ? ?Problem: Emotional Distress   ?  ? ?Goal: Emotional Health Supported;Manage ADLs; Manage depression issues   ?Start Date: 07/30/2021  ?Expected End Date: 10/24/2021  ?This Visit's Progress: On track  ?Recent Progress: On track  ?Priority: Medium  ?Note:   ?Current Barriers:  ?Chronic Mental Health needs related to depression management and anxiety management ?Mobility issues ?Pain issues ?Illiteracy ?Suicidal Ideation/Homicidal Ideation: No ? ?Clinical Social Work Goal(s):  ?patient will work with SW monthly by telephone or in person to reduce or manage symptoms related to depression and anxiety issues ?Patient will call RNCM or [name] as needed in next 30 days for CCM support ?Patient will communicate with [name] in next 30 days to discuss mobility issues of client and to discuss pain issues of client ? ?Interventions: ?1:1 collaboration with [name], MD regarding development and update of comprehensive plan of care as evidenced by provider attestation and co-signature ?Discussed client needs with Christian Mate Cosby ?Discussed client appointment with Dr. Livia Snellen on 07/30/21.  [name] did not  have his home health aide with him at this appointment at [facility].  Client said he did get his B12 injection on 07/30/21 at [facility] ?Client was confused about information he received at this PCP visit. [name] communicated with RNCM [name] today about client confusion. ?RNCM to contact Dr. Livia Snellen to verify information client received about his medical condition about PCP visit today ?Discussed sleeping issues of client and discussed appetite of client. ?Discussed in home care needs of client. He said his home health aide ,Theadora Rama , had been ill and it had been a number of days since home health aide had assisted him in the home. Abdulloh said he will call Theadora Rama today and talk with her about her ability to continue to help him in the home. He said he also will call [place] to see if agency can send out another home health aide to help him on a temporary basis ?Discussed energy of patient. He said he is often weak or fatigued. ?He said he is uses his cane to walk. He said he usually walks to mailbox and back.  He is at risk for falls ?Discussed family support. He said he plans to call his sister today and talk with his sister about support for client ?Encouraged client to talk  with RNCM for CCM nursing support ?Provided counseling support for client.  ? ?Patient Self Care Activities:  ?Self administers medications as prescribed ?Attends all scheduled provider appointments ?Performs ADL's independently ? ?Patient Coping Strengths:  ?Family ? ?Patient Self Care Deficits:  ?Mobility issues ?Needs some help occasionally with in home care needs ? ?Patient Goals:  ?- spend time or talk with others at least 2 to 3 times per week ?- practice relaxation or meditation daily ?- keep a calendar with appointment dates ? ?Follow Up Plan: [name] to call client on 08/13/21 at 10:00 AM to assess needs of client. .   ?  ?  ?[name].Maston Wight MSW, [name] ?Licensed Clinical Social Worker ?[facility] ?[phone]  ?

## 2021-07-31 ENCOUNTER — Other Ambulatory Visit: Payer: Self-pay | Admitting: Family Medicine

## 2021-07-31 DIAGNOSIS — I10 Essential (primary) hypertension: Secondary | ICD-10-CM

## 2021-07-31 DIAGNOSIS — I25118 Atherosclerotic heart disease of native coronary artery with other forms of angina pectoris: Secondary | ICD-10-CM

## 2021-07-31 DIAGNOSIS — I209 Angina pectoris, unspecified: Secondary | ICD-10-CM

## 2021-07-31 LAB — CBC WITH DIFFERENTIAL/PLATELET
Basophils Absolute: 0.1 10*3/uL (ref 0.0–0.2)
Basos: 1 %
EOS (ABSOLUTE): 0.8 10*3/uL — ABNORMAL HIGH (ref 0.0–0.4)
Eos: 9 %
Hematocrit: 41.4 % (ref 37.5–51.0)
Hemoglobin: 14.8 g/dL (ref 13.0–17.7)
Immature Grans (Abs): 0 10*3/uL (ref 0.0–0.1)
Immature Granulocytes: 0 %
Lymphocytes Absolute: 3.7 10*3/uL — ABNORMAL HIGH (ref 0.7–3.1)
Lymphs: 40 %
MCH: 34.5 pg — ABNORMAL HIGH (ref 26.6–33.0)
MCHC: 35.7 g/dL (ref 31.5–35.7)
MCV: 97 fL (ref 79–97)
Monocytes Absolute: 0.5 10*3/uL (ref 0.1–0.9)
Monocytes: 6 %
Neutrophils Absolute: 4.1 10*3/uL (ref 1.4–7.0)
Neutrophils: 44 %
Platelets: 228 10*3/uL (ref 150–450)
RBC: 4.29 x10E6/uL (ref 4.14–5.80)
RDW: 12.4 % (ref 11.6–15.4)
WBC: 9.2 10*3/uL (ref 3.4–10.8)

## 2021-07-31 LAB — LIPID PANEL
Chol/HDL Ratio: 3.6 ratio (ref 0.0–5.0)
Cholesterol, Total: 91 mg/dL — ABNORMAL LOW (ref 100–199)
HDL: 25 mg/dL — ABNORMAL LOW (ref >39)
LDL Chol Calc (NIH): 34 mg/dL (ref 0–99)
Triglycerides: 195 mg/dL — ABNORMAL HIGH (ref 0–149)
VLDL Cholesterol Cal: 32 mg/dL (ref 5–40)

## 2021-07-31 LAB — CMP14+EGFR
ALT: 17 IU/L (ref 0–44)
AST: 26 IU/L (ref 0–40)
Albumin/Globulin Ratio: 1.4 (ref 1.2–2.2)
Albumin: 4 g/dL (ref 3.8–4.8)
Alkaline Phosphatase: 98 IU/L (ref 44–121)
BUN/Creatinine Ratio: 17 (ref 10–24)
BUN: 14 mg/dL (ref 8–27)
Bilirubin Total: 0.5 mg/dL (ref 0.0–1.2)
CO2: 30 mmol/L — ABNORMAL HIGH (ref 20–29)
Calcium: 9.3 mg/dL (ref 8.6–10.2)
Chloride: 102 mmol/L (ref 96–106)
Creatinine, Ser: 0.84 mg/dL (ref 0.76–1.27)
Globulin, Total: 2.8 g/dL (ref 1.5–4.5)
Glucose: 95 mg/dL (ref 70–99)
Potassium: 4.3 mmol/L (ref 3.5–5.2)
Sodium: 144 mmol/L (ref 134–144)
Total Protein: 6.8 g/dL (ref 6.0–8.5)
eGFR: 98 mL/min/{1.73_m2} (ref >59)

## 2021-07-31 LAB — VITAMIN B12: Vitamin B-12: 496 pg/mL (ref 232–1245)

## 2021-08-01 NOTE — Progress Notes (Signed)
Hello [name],  Your lab result is normal and/or stable.Some minor variations that are not significant are commonly marked abnormal, but do not represent any medical problem for you.  Best regards, Oliver Neuwirth, M.D.

## 2021-08-13 ENCOUNTER — Ambulatory Visit: Payer: Medicare Other | Admitting: Licensed Clinical Social Worker

## 2021-08-13 DIAGNOSIS — K219 Gastro-esophageal reflux disease without esophagitis: Secondary | ICD-10-CM

## 2021-08-13 DIAGNOSIS — I25118 Atherosclerotic heart disease of native coronary artery with other forms of angina pectoris: Secondary | ICD-10-CM

## 2021-08-13 DIAGNOSIS — I209 Angina pectoris, unspecified: Secondary | ICD-10-CM

## 2021-08-13 DIAGNOSIS — I252 Old myocardial infarction: Secondary | ICD-10-CM

## 2021-08-13 DIAGNOSIS — E785 Hyperlipidemia, unspecified: Secondary | ICD-10-CM

## 2021-08-13 DIAGNOSIS — F32A Depression, unspecified: Secondary | ICD-10-CM

## 2021-08-13 DIAGNOSIS — R482 Apraxia: Secondary | ICD-10-CM

## 2021-08-13 DIAGNOSIS — M5136 Other intervertebral disc degeneration, lumbar region: Secondary | ICD-10-CM

## 2021-08-13 DIAGNOSIS — E538 Deficiency of other specified B group vitamins: Secondary | ICD-10-CM

## 2021-08-13 DIAGNOSIS — F411 Generalized anxiety disorder: Secondary | ICD-10-CM

## 2021-08-13 DIAGNOSIS — J441 Chronic obstructive pulmonary disease with (acute) exacerbation: Secondary | ICD-10-CM

## 2021-08-13 DIAGNOSIS — I10 Essential (primary) hypertension: Secondary | ICD-10-CM

## 2021-08-13 DIAGNOSIS — G809 Cerebral palsy, unspecified: Secondary | ICD-10-CM

## 2021-08-13 DIAGNOSIS — Z550 Illiteracy and low-level literacy: Secondary | ICD-10-CM

## 2021-08-13 NOTE — Patient Instructions (Addendum)
Visit Information ? ?Patient Goals:  Manage emotions. Manage depression issues. Complete ADLs daily with assistance as needed ? ?Timeframe:  Short-Term Goal ?Priority:  Medium ?Progress: On Track ?Start Date:    07/30/21                       ?Expected End Date:    11/10/21                ? ?Follow Up Date  CAP SW to communicate with client as needed to address client's SW needs  ?  ?Manage emotions. Manage depression issues . Complete ADLs daily with assistance as needed ?  ?Why is this important?   ?When you are stressed, down or upset, your body reacts too.  ?For example, your blood pressure may get higher; you may have a headache or stomachache.  ?When your emotions get the best of you, your body's ability to fight off cold and flu gets weak.  ?These steps will help you manage your emotions.    ? ?Patient Self Care Activities:  ?Self administers medications as prescribed ?Attends all scheduled provider appointments ? ?Patient Coping Strengths:  ?Family ?Friends ? ?Patient Self Care Deficits:  ?Mobility issues ?Depression issues ?Anxiety issues ? ?Patient Goals:  ?- spend time or talk with others at least 2 to 3 times per week ?- practice relaxation or meditation daily ?- keep a calendar with appointment dates ? ?Follow Up Plan: [name] [name] is discharging  client today from [place] with CCM program since client now has SW support with CAP SW.  CAP SW to communicate with client as needed to address client's SW needs  ? ?[name].[name] MSW, [name] ?Licensed Clinical Social Worker ?[facility] ?[phone] ?

## 2021-08-13 NOTE — Chronic Care Management (AMB) (Signed)
?Chronic Care Management  ? ? Clinical Social Work Note ? ?08/13/2021 ?Name: [name] MRN: [id number] DOB: 09/01/1955 ? ?[name] is a 66 y.o. year old male who is a primary care patient of [name], [name], MD. The CCM team was consulted to assist the patient with chronic disease management and/or care coordination needs related to: [name] .  ? ?Engaged with patient by telephone for follow up visit in response to provider referral for social work chronic care management and care coordination services.  ? ?Consent to Services:  ?The patient was given information about Chronic Care Management services, agreed to services, and gave verbal consent prior to initiation of services.  Please see initial visit note for detailed documentation.  ? ?Patient agreed to services and consent obtained.  ? ?Assessment: Review of patient past medical history, allergies, medications, and health status, including review of relevant consultants reports was performed today as part of a comprehensive evaluation and provision of chronic care management and care coordination services.    ? ?SDOH (Social Determinants of Health) assessments and interventions performed:  ?SDOH Interventions   ? ?Flowsheet Row Most Recent Value  ?SDOH Interventions   ?Physical Activity Interventions Other (Comments)  [walking challenges. he uses a cane to help him walk]  ?Stress Interventions Provide Counseling  [client has stress related to managing medical needs]  ?Depression Interventions/Treatment  Counseling  ? ?  ?  ? ?Advanced Directives Status: See Vynca application for related entries. ? ?CCM Care Plan ? ?Allergies  ?Allergen Reactions  ? Chantix [Varenicline Tartrate] Nausea And Vomiting  ? ? ?Outpatient Encounter Medications as of 08/13/2021  ?Medication Sig  ? acetaminophen (TYLENOL) 325 MG tablet Take 2 tablets (650 mg total) by mouth every 6 (six) hours as needed for mild pain or headache.  ? albuterol (VENTOLIN HFA) 108 (90  Base) MCG/ACT inhaler Inhale 2 puffs into the lungs every 6 (six) hours as needed for wheezing or shortness of breath.  ? aspirin EC 81 MG tablet Take 1 tablet (81 mg total) by mouth daily.  ? atorvastatin (LIPITOR) 40 MG tablet TAKE 1 TABLET ONCE DAILY AT 6PM  ? buPROPion (WELLBUTRIN SR) 150 MG 12 hr tablet TAKE (1) TABLET DAILY WITH BREAKFAST.  ? celecoxib (CELEBREX) 200 MG capsule TAKE (1) CAPSULE DAILY AS NEEDED.  ? cetaphil (CETAPHIL) lotion Apply 1 application topically 2 (two) times daily. For rash. Apply after washing with [facility] shower gel and rinsing with clear warm water.  ? DULoxetine (CYMBALTA) 60 MG capsule TAKE 1 CAPSULE ONCE DAILY WITH LARGEST MEAL  ? fexofenadine (ALLEGRA) 180 MG tablet Take 1 tablet (180 mg total) by mouth daily. For allergy symptoms  ? fluocinonide-emollient (LIDEX-E) 0.05 % cream Apply 1 application topically 2 (two) times daily. To affected areas  ? fluticasone (FLONASE) 50 MCG/ACT nasal spray Place 2 sprays into both nostrils daily as needed for allergies or rhinitis.  ? Fluticasone-Umeclidin-Vilant (TRELEGY ELLIPTA) 100-62.5-25 MCG/ACT AEPB Inhale 1 puff into the lungs daily.  ? isosorbide mononitrate (IMDUR) 60 MG 24 hr tablet TAKE 1 TABLET DAILY  ? metoprolol tartrate (LOPRESSOR) 25 MG tablet TAKE (1) TABLET TWICE A DAY.  ? mirtazapine (REMERON) 15 MG tablet TAKE 1 TABLET AT BEDTIME AS NEEDED FOR SLEEP  ? nitroGLYCERIN (NITROSTAT) 0.4 MG SL tablet Place 1 tablet (0.4 mg total) under the tongue every 5 (five) minutes x 3 doses as needed for chest pain.  ? pantoprazole (PROTONIX) 40 MG tablet TAKE (1) TABLET TWICE A DAY.  ?  tamsulosin (FLOMAX) 0.4 MG CAPS capsule TAKE (2) CAPSULES DAILY AFTER SUPPER.  ? ?Facility-Administered Encounter Medications as of 08/13/2021  ?Medication  ? cyanocobalamin ((VITAMIN B-12)) injection 1,000 mcg  ? ? ?Patient Active Problem List  ? Diagnosis Date Noted  ? Centrilobular emphysema ([place]) 07/30/2021  ? B12 deficiency 07/30/2021  ? Back pain  without sciatica 10/31/2020  ? Degenerative disc disease, lumbar 01/16/2019  ? Depression   ? Hypokalemia 06/27/2018  ? Seizure disorder ([place]) 02/07/2018  ? Apraxia of speech 10/13/2017  ? Left-sided weakness 10/13/2017  ? Coronary artery disease of native artery of native heart with stable angina pectoris ([place])   ? Illiteracy 10/23/2016  ? Carpal tunnel syndrome of right wrist 06/15/2016  ? GAD (generalized anxiety disorder) 05/01/2016  ? History of MI (myocardial infarction) 02/26/2015  ? Dyslipidemia, goal LDL below 70 10/08/2009  ? Infantile cerebral palsy ([place]) 10/08/2009  ? Essential hypertension 10/08/2009  ? ? ?Conditions to be addressed/monitored: monitor client management of anxiety issues and depression issues ? ?Care Plan : [name]  ?Updates made by [name], [name] since 08/13/2021 12:00 AM  ?  ? ?Problem: Emotional Distress   ?  ? ?Goal: Emotional Health Supported;Manage ADLs; Manage depression issues   ?Start Date: 07/30/2021  ?Expected End Date: 11/11/2021  ?This Visit's Progress: On track  ?Recent Progress: On track  ?Priority: Medium  ?Note:   ?Current Barriers:  ?Chronic Mental Health needs related to depression management and anxiety management ?Mobility issues ?Pain issues ?Illiteracy ?Suicidal Ideation/Homicidal Ideation: No ? ?Clinical Social Work Goal(s):  ?patient will work with SW monthly by telephone or in person to reduce or manage symptoms related to depression and anxiety issues ?Patient will call RNCM or [name] as needed in next 30 days for CCM support ?Patient will communicate with [name] in next 30 days to discuss mobility issues of client and to discuss pain issues of client ? ?Interventions: ?1:1 collaboration with [name], MD regarding development and update of comprehensive plan of care as evidenced by provider attestation and co-signature ?Discussed client needs with Christian Mate Lanphier ?Discussed client upcoming appointments ?Discussed sleeping issues of client and  discussed appetite of client. ?Discussed in home care needs of client. He said his home health aide ,Theadora Rama , has been helping him in the home and that this is very beneficial to client. Client said that representatives of CAP came to his home last week and did assessment of client for CAP services. Client said he was approved for CAP services and is hoping to begin to get more in home care support hours with CNA, Theadora Rama. Client said CAP program had assigned a SW to assist him. He said CAP SW had already visited him in his home and that CAP SW had called him once already on the phone. He is very pleased with being approved for CAP services and pleased with CAP SW support. ?Discussed ambulation of client. He said he is uses his cane to walk. He said he usually walks to mailbox and back.  He is at risk for falls ?Discussed family support. He receives support in the home mostly from his home health aide , Theadora Rama ?Provided counseling support for client ?Encouraged client to talk with [facility] for CCM nursing support ?[name] congratulated [place] on being approved for CAP support in the home and congratulated him in working with new CAP Education officer, museum.  ?[name] [name] is discharging client today from [place] with CCM program since client is  receiving CAP SW support at this time ? ?Patient Self Care Activities:  ?Self administers medications as prescribed ?Attends all scheduled provider appointments ?Performs ADL's independently ? ?Patient Coping Strengths:  ?Family ? ?Patient Self Care Deficits:  ?Mobility issues ?Needs some help occasionally with in home care needs ? ?Patient Goals:  ?- spend time or talk with others at least 2 to 3 times per week ?- practice relaxation or meditation daily ?- keep a calendar with appointment dates ? ?Follow Up Plan: CAP SW to contact client to work with client related to SW needs.  ?  ?  ?[name].Eleesha Purkey MSW, [name] ?Licensed Clinical Social Worker ?[facility] ?[phone] ?

## 2021-08-15 ENCOUNTER — Telehealth: Payer: Medicare Other

## 2021-08-17 DIAGNOSIS — F32A Depression, unspecified: Secondary | ICD-10-CM

## 2021-08-17 DIAGNOSIS — I252 Old myocardial infarction: Secondary | ICD-10-CM

## 2021-08-17 DIAGNOSIS — J441 Chronic obstructive pulmonary disease with (acute) exacerbation: Secondary | ICD-10-CM

## 2021-08-17 DIAGNOSIS — I209 Angina pectoris, unspecified: Secondary | ICD-10-CM

## 2021-08-17 DIAGNOSIS — E785 Hyperlipidemia, unspecified: Secondary | ICD-10-CM

## 2021-08-17 DIAGNOSIS — I25118 Atherosclerotic heart disease of native coronary artery with other forms of angina pectoris: Secondary | ICD-10-CM

## 2021-08-17 DIAGNOSIS — I10 Essential (primary) hypertension: Secondary | ICD-10-CM

## 2021-08-29 ENCOUNTER — Telehealth: Payer: Self-pay | Admitting: Family Medicine

## 2021-09-01 ENCOUNTER — Ambulatory Visit (INDEPENDENT_AMBULATORY_CARE_PROVIDER_SITE_OTHER): Payer: Medicare Other | Admitting: Family

## 2021-09-01 ENCOUNTER — Ambulatory Visit (INDEPENDENT_AMBULATORY_CARE_PROVIDER_SITE_OTHER): Payer: Medicare Other | Admitting: Emergency Medicine

## 2021-09-01 ENCOUNTER — Ambulatory Visit: Payer: Medicare Other | Admitting: Family Medicine

## 2021-09-01 ENCOUNTER — Encounter: Payer: Self-pay | Admitting: Family

## 2021-09-01 VITALS — BP 129/75 | HR 84 | Temp 97.2°F | Ht 73.0 in | Wt 253.8 lb

## 2021-09-01 DIAGNOSIS — E538 Deficiency of other specified B group vitamins: Secondary | ICD-10-CM | POA: Diagnosis not present

## 2021-09-01 DIAGNOSIS — S70362A Insect bite (nonvenomous), left thigh, initial encounter: Secondary | ICD-10-CM | POA: Diagnosis not present

## 2021-09-01 DIAGNOSIS — R6889 Other general symptoms and signs: Secondary | ICD-10-CM

## 2021-09-01 DIAGNOSIS — Z87828 Personal history of other (healed) physical injury and trauma: Secondary | ICD-10-CM | POA: Diagnosis not present

## 2021-09-01 DIAGNOSIS — W57XXXA Bitten or stung by nonvenomous insect and other nonvenomous arthropods, initial encounter: Secondary | ICD-10-CM | POA: Diagnosis not present

## 2021-09-01 MED ORDER — DOXYCYCLINE HYCLATE 100 MG PO TABS: 100.0000 mg | ORAL_TABLET | Freq: Two times a day (BID) | ORAL | 0 refills | Status: DC

## 2021-09-01 NOTE — Patient Instructions (Signed)
Tick Bite Information, Adult Ticks are insects that draw blood for food. Most ticks live in shrubs and grassy and wooded areas. They climb onto people and animals that brush against the leaves and grasses that they rest on. Then they bite, attaching themselves to the skin. Most ticks are harmless, but some ticks may carry germs that can spread to a person through a bite and cause a disease. To reduce your risk of getting a disease from a tick bite, make sure you: Take steps to prevent tick bites. Check for ticks after being outdoors where ticks live. Watch for symptoms of disease if a tick attached to you or if you suspect a tick bite. How can I prevent tick bites? Take these steps to help prevent tick bites when you go outdoors in an area where ticks live: Use insect repellent Use insect repellent that has DEET (20% or higher), picaridin, or IR3535 in it. Follow the instructions on the label. Use these products on: Bare skin. The top of your boots. Your pant legs. Your sleeve cuffs. For insect repellent that contains permethrin, follow the instructions on the label. Use these products on: Clothing. Boots. Outdoor gear. Tents. When you are outside Wear protective clothing. Long sleeves and long pants offer the best protection from ticks. Wear light-colored clothing so you can see ticks more easily. Tuck your pant legs into your socks. If you go walking on a trail, stay in the middle of the trail so your skin, hair, and clothing do not touch the bushes. Avoid walking through areas with long grass. Check for ticks on your clothing, hair, and skin often while you are outside, and check again before you go inside. Make sure to check the scalp, neck, armpits, waist, groin, and joint areas. These are the spots where ticks attach themselves most often. When you go indoors Check your clothing for ticks. Tumble dry clothes in a dryer on high heat for at least 10 minutes. If clothes are damp,  additional time may be needed. If clothes require washing, use hot water. Examine gear and pets. Shower soon after being outdoors. Check your body for ticks. Conduct a full body check using a mirror. What is the proper way to remove a tick? If you find a tick on your body, remove it as soon as possible. Removing a tick sooner can prevent germs from passing to your body. Do not remove the tick with your bare fingers. To remove a tick that is crawling on your skin but has not bitten, use either of these methods: Go outdoors and brush the tick off. Remove the tick with tape or a lint roller. To remove a tick that is attached to your skin: Wash your hands. If you have latex gloves, put them on. Use fine-tipped tweezers, curved forceps, or a tick-removal tool to gently grasp the tick as close to your skin and the tick's head as possible. Gently pull with a steady, upward, even pressure until the tick lets go. When removing the tick: Take care to keep the tick's head attached to its body. Do not twist or jerk the tick. This can make the tick's head or mouth parts break off and remain in the skin. Do not squeeze or crush the tick's body. This could force disease-carrying fluids from the tick into your body. Do not try to remove a tick with heat, alcohol, petroleum jelly, or fingernail polish. Using these methods can cause the tick to salivate and regurgitate into your bloodstream,   increasing your risk of getting a disease. What should I do after removing a tick? Dispose of the tick. Do not crush a tick with your fingers. Clean the bite area and your hands with soap and water, rubbing alcohol, or an iodine scrub. If an antiseptic cream or ointment is available, apply a small amount to the bite site. Wash and disinfect any instruments that you used to remove the tick. How should I dispose of a tick? To dispose of a live tick, use one of these methods: Place it in rubbing alcohol. Place it in a sealed  bag or container. Wrap it tightly in tape. Flush it down the toilet. Contact a health care provider if: You have symptoms of a disease after a tick bite. Symptoms of a tick-borne disease can occur from moments after the tick bites to 30 days after a tick is removed. Symptoms include: Fever or chills. Any of these signs in the bite area: A red rash that makes a circle (bull's-eye rash) in the bite area. Redness and swelling. Headache. Muscle, joint, or bone pain. Abnormal tiredness. Numbness in your legs or difficulty walking or moving your legs. Tender, swollen lymph glands. A part of a tick breaks off and gets stuck in your skin. Get help right away if: You are not able to remove a tick. You experience muscle weakness or paralysis. Your symptoms get worse or you experience new symptoms. You find an engorged tick on your skin and you are in an area where disease from ticks is a high risk. Summary Ticks may carry germs that can spread to a person through a bite and cause a disease. Wear protective clothing and use insect repellent to prevent tick bites. Follow the instructions on the label. If you find a tick on your body, remove it as soon as possible. If the tick is attached, do not try to remove with heat, alcohol, petroleum jelly, or fingernail polish. Remove the attached tick using fine-tipped tweezers, curved forceps, or a tick-removal tool. Gently pull with steady, upward, even pressure until the tick lets go. Do not twist or jerk the tick. Do not squeeze or crush the tick's body. If you have symptoms of a disease after being bitten by a tick, contact a health care provider. This information is not intended to replace advice given to you by your health care provider. Make sure you discuss any questions you have with your health care provider. Document Revised: 04/03/2019 Document Reviewed: 04/03/2019 Elsevier Patient Education  2023 [employer]  

## 2021-09-01 NOTE — Progress Notes (Signed)
? ?Subjective:  ? ? Patient ID: [name], male    DOB: 08/01/1955, 66 y.o.   MRN: [id number] ? ?Chief Complaint  ?Patient presents with  ? bug bites  ?  X 3 days on left hip   ? ?Pt presents to the office today with complaints of three bites on left thigh. He noticed it Friday and has worsen. States he had nausea and vomiting over the weekend, but that has resolved two days ago. Unsure if this was related. Denies any fever, new joint pain, or swelling. Reports mild headache, and just "feeling bad".  ?Rash ?This is a new problem. The current episode started in the past 7 days. Location: left thigh. The rash is characterized by itchiness, pain and redness. Associated symptoms include vomiting. Past treatments include antibiotic cream and anti-itch cream. The treatment provided mild relief.  ? ? ? ?Review of Systems  ?Gastrointestinal:  Positive for vomiting.  ?Skin:  Positive for rash.  ?All other systems reviewed and are negative. ? ?   ?Objective:  ? Physical Exam ?Vitals reviewed.  ?Constitutional:   ?   General: He is not in acute distress. ?   Appearance: He is well-developed.  ?HENT:  ?   Head: Normocephalic.  ?Eyes:  ?   General:     ?   Right eye: No discharge.     ?   Left eye: No discharge.  ?   Pupils: Pupils are equal, round, and reactive to light.  ?Neck:  ?   Thyroid: No thyromegaly.  ?Cardiovascular:  ?   Rate and Rhythm: Normal rate and regular rhythm.  ?   Heart sounds: Normal heart sounds. No murmur heard. ?Pulmonary:  ?   Effort: Pulmonary effort is normal. No respiratory distress.  ?   Breath sounds: Normal breath sounds. No wheezing.  ?Abdominal:  ?   General: Bowel sounds are normal. There is no distension.  ?   Palpations: Abdomen is soft.  ?   Tenderness: There is no abdominal tenderness.  ?Musculoskeletal:     ?   General: No tenderness. Normal range of motion.  ?   Cervical back: Normal range of motion and neck supple.  ?Skin: ?   General: Skin is warm and dry.  ?   Findings: No erythema  or rash.  ? ?    ?   Comments: Three erythemas small circular bites. Tick present on left thigh at spot 1   ?Neurological:  ?   Mental Status: He is alert and oriented to person, place, and time.  ?   Cranial Nerves: No cranial nerve deficit.  ?   Deep Tendon Reflexes: Reflexes are normal and symmetric.  ?Psychiatric:     ?   Behavior: Behavior normal.     ?   Thought Content: Thought content normal.     ?   Judgment: Judgment normal.  ? ? ? ? ?BP 129/75   Pulse 84   Temp (!) 97.2 ?F (36.2 ?C) (Temporal)   Ht '6\' 1"'$  (1.854 m)   Wt 253 lb 12.8 oz (115.1 kg)   SpO2 95%   BMI 33.48 kg/m?  ? ?   ?Assessment & Plan:  ?AZZAN BUTLER comes in today with chief complaint of bug bites (X 3 days on left hip ) ? ? ?Diagnosis and orders addressed: ? ?1. Tick bite of left thigh, initial encounter ?-Pt to report any new fever, joint pain, or rash ?-Wear protective clothing while outside- Long  sleeves and long pants ?-Put insect repellent on all exposed skin and along clothing ?-Take a shower as soon as possible after being outside ?Follow up if symptoms worsen or do not improve  ?- doxycycline (VIBRA-TABS) 100 MG tablet; Take 1 tablet (100 mg total) by mouth 2 (two) times daily.  Dispense: 42 tablet; Refill: 0 ? ?2. Tick bite with tick attached for 8 hours or longer ? ?3. Flu-like symptoms ? ? ?Follow up plan: ?Keep follow up with PCP ? ?Evelina Dun, FNP ? ? ?

## 2021-09-02 ENCOUNTER — Other Ambulatory Visit: Payer: Self-pay | Admitting: Family Medicine

## 2021-09-02 DIAGNOSIS — F411 Generalized anxiety disorder: Secondary | ICD-10-CM

## 2021-09-02 DIAGNOSIS — F3341 Major depressive disorder, recurrent, in partial remission: Secondary | ICD-10-CM

## 2021-09-09 NOTE — Progress Notes (Unsigned)
Cardiology Office Note   Date:  09/10/2021   ID:  James, Hale 10/20/1955, MRN [id number]  PCP:  [name], MD  Cardiologist:   Minus Breeding, MD   Chief Complaint  Patient presents with   Chest Pain      History of Present Illness: [name] is a 66 y.o. male who presents for follow up of CAD.  The patient has a history of CAD. He has has had a remote LHC that showed 30% LAD and 70% diagonal lesion. He has reported having stents in the past, although this was felt to be possibly incorrect per prior cardiology evaluation.  He had chest pain and had cath in 2019.  The patient underwent LHC that showed a 70% LAD stenosis with other nonobstructive CAD and normal LV gram; no culprit lesion was identified.  He was managed medically.     Since I last saw him he is complaining mostly of his legs giving out from underneath him.  He says this happens when he walks and sometimes has fallen.  Not really describing pain although he is somewhat vague about this.  He says he does get some chest discomfort sometimes before this or might have it afterwards.  He is not describing any neck or arm discomfort.  He is not getting any new shortness of breath, PND or orthopnea.  He is not describing palpitations, presyncope or syncope.  At the last visit I was going to order a sleep study on him because he has such significant fatigue but he did not have this done.  He wants to inquire about a home sleep study.   Past Medical History:  Diagnosis Date   Cerebral palsy ([place])    Coronary artery disease    LAD 70% stenosis, cath 2019   Depression    GERD (gastroesophageal reflux disease)    Hypertension    Scoliosis    Seizures ([place])    pt's sister states that patinet has had seizures in the past, pt camr for PAT by himself on last visit and she stst "they misunderstood what he was trying to say. Pt saw neurologist in March who did CT and states patient does not have seizures. On no  meds, had seizures as child.    Past Surgical History:  Procedure Laterality Date   CARPAL TUNNEL RELEASE Right 08/13/2016   Procedure: CARPAL TUNNEL RELEASE;  Surgeon: Carole Civil, MD;  Location: [facility];  Service: Orthopedics;  Laterality: Right;   CORONARY ANGIOPLASTY WITH STENT PLACEMENT     LEFT HEART CATH AND CORONARY ANGIOGRAPHY N/A 07/10/2017   Procedure: LEFT HEART CATH AND CORONARY ANGIOGRAPHY;  Surgeon: Troy Sine, MD;  Location: [facility];  Service: Cardiovascular;  Laterality: N/A;     Current Outpatient Medications  Medication Sig Dispense Refill   acetaminophen (TYLENOL) 325 MG tablet Take 2 tablets (650 mg total) by mouth every 6 (six) hours as needed for mild pain or headache.     albuterol (VENTOLIN HFA) 108 (90 Base) MCG/ACT inhaler Inhale 2 puffs into the lungs every 6 (six) hours as needed for wheezing or shortness of breath. 1 each 11   aspirin EC 81 MG tablet Take 1 tablet (81 mg total) by mouth daily. 30 tablet 11   atorvastatin (LIPITOR) 40 MG tablet TAKE 1 TABLET ONCE DAILY AT 6PM 90 tablet 1   buPROPion (WELLBUTRIN SR) 150 MG 12 hr tablet TAKE (1) TABLET DAILY WITH BREAKFAST. Page  tablet 1   cetaphil (CETAPHIL) lotion Apply 1 application topically 2 (two) times daily. For rash. Apply after washing with [facility] shower gel and rinsing with clear warm water. 473 mL 0   doxycycline (VIBRA-TABS) 100 MG tablet Take 1 tablet (100 mg total) by mouth 2 (two) times daily. 42 tablet 0   DULoxetine (CYMBALTA) 60 MG capsule TAKE 1 CAPSULE ONCE DAILY WITH LARGEST MEAL 90 capsule 1   fexofenadine (ALLEGRA) 180 MG tablet Take 1 tablet (180 mg total) by mouth daily. For allergy symptoms 90 tablet 2   fluocinonide-emollient (LIDEX-E) 0.05 % cream Apply 1 application topically 2 (two) times daily. To affected areas 60 g 5   fluticasone (FLONASE) 50 MCG/ACT nasal spray Place 2 sprays into both nostrils daily as needed for allergies or rhinitis. 48 g 3    Fluticasone-Umeclidin-Vilant (TRELEGY ELLIPTA) 100-62.5-25 MCG/ACT AEPB Inhale 1 puff into the lungs daily. 1 each 11   isosorbide mononitrate (IMDUR) 60 MG 24 hr tablet TAKE 1 TABLET DAILY 90 tablet 0   metoprolol tartrate (LOPRESSOR) 25 MG tablet TAKE (1) TABLET TWICE A DAY. 60 tablet 5   mirtazapine (REMERON) 15 MG tablet TAKE 1 TABLET AT BEDTIME AS NEEDED FOR SLEEP 90 tablet 0   pantoprazole (PROTONIX) 40 MG tablet TAKE (1) TABLET TWICE A DAY. 180 tablet 1   tamsulosin (FLOMAX) 0.4 MG CAPS capsule TAKE (2) CAPSULES DAILY AFTER SUPPER. 180 capsule 3   nitroGLYCERIN (NITROSTAT) 0.4 MG SL tablet Place 1 tablet (0.4 mg total) under the tongue every 5 (five) minutes x 3 doses as needed for chest pain. 25 tablet 4   Current Facility-Administered Medications  Medication Dose Route Frequency Provider Last Rate Last Admin   cyanocobalamin ((VITAMIN B-12)) injection 1,000 mcg  1,000 mcg Intramuscular Q30 days [name], MD   1,000 mcg at 09/01/21 1103    Allergies:   Chantix [varenicline tartrate]    ROS:  Please see the history of present illness.   Otherwise, review of systems are positive for none.   All other systems are reviewed and negative.    PHYSICAL EXAM: VS:  BP 122/78   Pulse 78   Ht '6\' 1"'$  (1.854 m)   Wt 255 lb (115.7 kg)   SpO2 92%   BMI 33.64 kg/m  , BMI Body mass index is 33.64 kg/m. GENERAL:  Well appearing NECK:  No jugular venous distention, waveform within normal limits, carotid upstroke brisk and symmetric, no bruits, no thyromegaly LUNGS:  Clear to auscultation bilaterally CHEST:  Unremarkable HEART:  PMI not displaced or sustained,S1 and S2 within normal limits, no S3, no S4, no clicks, no rubs, no murmurs ABD:  Flat, positive bowel sounds normal in frequency in pitch, no bruits, no rebound, no guarding, no midline pulsatile mass, no hepatomegaly, no splenomegaly EXT:  2 plus pulses throughout, no edema, no cyanosis no clubbing    EKG:  EKG is  ordered  today. The ekg ordered today demonstrates rhythm, rate 78, axis within normal limits, intervals within normal limits, no acute ST-T wave changes.   Recent Labs: 01/31/2021: TSH 0.809 07/30/2021: ALT 17; BUN 14; Creatinine, Ser 0.84; Hemoglobin 14.8; Platelets 228; Potassium 4.3; Sodium 144    Lipid Panel    Component Value Date/Time   CHOL 91 (L) 07/30/2021 1057   TRIG 195 (H) 07/30/2021 1057   HDL 25 (L) 07/30/2021 1057   CHOLHDL 3.6 07/30/2021 1057   CHOLHDL 3.2 07/11/2017 0000   VLDL 26 07/11/2017 0000  LDLCALC 34 07/30/2021 1057      Wt Readings from Last 3 Encounters:  09/10/21 255 lb (115.7 kg)  09/01/21 253 lb 12.8 oz (115.1 kg)  07/30/21 258 lb 3.2 oz (117.1 kg)      Other studies Reviewed: Additional studies/ records that were reviewed today include: Labs. Review of the above records demonstrates:  Please see elsewhere in the note.     ASSESSMENT AND PLAN:   CAD: He is describing some chest discomfort and some other vague symptoms.  He has known moderate LAD disease.  I think it is prudent to screen him with a Lexiscan Myoview.  HTN: His blood pressure is at target.  No change in therapy.  DYSLIPIDEMIA:   LDL was 34 with an HDL of 25.  No change in therapy.   TOBACCO ABUSE: We had a conversation about the need to stop smoking again.  FATIGUE:   He says he would not want to have an in lab sleep study so I will see if I can arrange a home sleep study.  Stop bang is 5.   Current medicines are reviewed at length with the patient today.  The patient does not have concerns regarding medicines.  The following changes have been made:  None  Labs/ tests ordered today include:     Orders Placed This Encounter  Procedures   NM Myocar Multi W/Spect W/Wall Motion / EF   EKG 12-Lead   Home sleep test     Disposition:   FU with in me in one year.    Signed, Minus Breeding, MD  09/10/2021 3:58 PM    [facility]

## 2021-09-10 ENCOUNTER — Ambulatory Visit (INDEPENDENT_AMBULATORY_CARE_PROVIDER_SITE_OTHER): Payer: Medicare Other | Admitting: Cardiology

## 2021-09-10 ENCOUNTER — Encounter: Payer: Self-pay | Admitting: Cardiology

## 2021-09-10 VITALS — BP 122/78 | HR 78 | Ht 73.0 in | Wt 255.0 lb

## 2021-09-10 DIAGNOSIS — I209 Angina pectoris, unspecified: Secondary | ICD-10-CM

## 2021-09-10 DIAGNOSIS — I10 Essential (primary) hypertension: Secondary | ICD-10-CM | POA: Diagnosis not present

## 2021-09-10 DIAGNOSIS — E785 Hyperlipidemia, unspecified: Secondary | ICD-10-CM

## 2021-09-10 DIAGNOSIS — Z720 Tobacco use: Secondary | ICD-10-CM | POA: Diagnosis not present

## 2021-09-10 DIAGNOSIS — R0683 Snoring: Secondary | ICD-10-CM

## 2021-09-10 DIAGNOSIS — I2510 Atherosclerotic heart disease of native coronary artery without angina pectoris: Secondary | ICD-10-CM

## 2021-09-10 DIAGNOSIS — I25118 Atherosclerotic heart disease of native coronary artery with other forms of angina pectoris: Secondary | ICD-10-CM | POA: Diagnosis not present

## 2021-09-10 DIAGNOSIS — R5383 Other fatigue: Secondary | ICD-10-CM

## 2021-09-10 MED ORDER — NITROGLYCERIN 0.4 MG SL SUBL: 0.4000 mg | SUBLINGUAL_TABLET | SUBLINGUAL | 4 refills | Status: AC | PRN

## 2021-09-10 NOTE — Patient Instructions (Signed)
Medication Instructions:  The current medical regimen is effective;  continue present plan and medications.  *If you need a refill on your cardiac medications before your next appointment, please call your pharmacy*  Testing/Procedures: Your physician has requested that you have a lexiscan myoview. For further information please visit [email or web address]. Please follow instruction sheet, as given.  You have been ordered to have a home sleep study.  You will be contacted with further instructions regarding this.  Follow-Up: At [facility], you and your health needs are our priority.  As part of our continuing mission to provide you with exceptional heart care, we have created designated Provider Care Teams.  These Care Teams include your primary Cardiologist (physician) and Advanced Practice Providers (APPs -  Physician Assistants and Nurse Practitioners) who all work together to provide you with the care you need, when you need it.  We recommend signing up for the patient portal called "MyChart".  Sign up information is provided on this After Visit Summary.  MyChart is used to connect with patients for Virtual Visits (Telemedicine).  Patients are able to view lab/test results, encounter notes, upcoming appointments, etc.  Non-urgent messages can be sent to your provider as well.   To learn more about what you can do with MyChart, go to [email or web address].    Your next appointment:   1 year(s)  The format for your next appointment:   In Person  Provider:   Minus Breeding, MD{   Important Information About Sugar

## 2021-09-12 ENCOUNTER — Ambulatory Visit: Payer: Medicare Other

## 2021-09-23 ENCOUNTER — Encounter (HOSPITAL_COMMUNITY): Payer: Medicare Other

## 2021-09-26 ENCOUNTER — Other Ambulatory Visit: Payer: Self-pay | Admitting: Family Medicine

## 2021-09-26 DIAGNOSIS — I25118 Atherosclerotic heart disease of native coronary artery with other forms of angina pectoris: Secondary | ICD-10-CM

## 2021-09-26 DIAGNOSIS — E785 Hyperlipidemia, unspecified: Secondary | ICD-10-CM

## 2021-09-26 DIAGNOSIS — K219 Gastro-esophageal reflux disease without esophagitis: Secondary | ICD-10-CM

## 2021-10-01 DIAGNOSIS — G809 Cerebral palsy, unspecified: Secondary | ICD-10-CM | POA: Diagnosis not present

## 2021-10-01 DIAGNOSIS — R531 Weakness: Secondary | ICD-10-CM | POA: Diagnosis not present

## 2021-10-02 ENCOUNTER — Telehealth: Payer: Self-pay | Admitting: Family Medicine

## 2021-10-02 ENCOUNTER — Ambulatory Visit (INDEPENDENT_AMBULATORY_CARE_PROVIDER_SITE_OTHER): Payer: Medicare Other

## 2021-10-02 ENCOUNTER — Ambulatory Visit: Payer: Medicare Other

## 2021-10-02 DIAGNOSIS — E538 Deficiency of other specified B group vitamins: Secondary | ICD-10-CM | POA: Diagnosis not present

## 2021-10-02 NOTE — Telephone Encounter (Signed)
Form printed & placed on providers desk

## 2021-10-02 NOTE — Telephone Encounter (Signed)
Pt needs provider to fill out a Handicap sticker form. Call pt when its ready.

## 2021-10-09 DIAGNOSIS — G809 Cerebral palsy, unspecified: Secondary | ICD-10-CM | POA: Diagnosis not present

## 2021-10-10 ENCOUNTER — Other Ambulatory Visit: Payer: Self-pay | Admitting: Family Medicine

## 2021-10-10 DIAGNOSIS — I209 Angina pectoris, unspecified: Secondary | ICD-10-CM

## 2021-10-10 DIAGNOSIS — I25118 Atherosclerotic heart disease of native coronary artery with other forms of angina pectoris: Secondary | ICD-10-CM

## 2021-10-10 DIAGNOSIS — G4701 Insomnia due to medical condition: Secondary | ICD-10-CM

## 2021-10-10 DIAGNOSIS — F3341 Major depressive disorder, recurrent, in partial remission: Secondary | ICD-10-CM

## 2021-10-10 NOTE — Telephone Encounter (Signed)
[name] patient  Last office visit 07/30/21 Last refill of Mirtazipine 07/16/21, #90, no refills

## 2021-10-16 NOTE — Telephone Encounter (Signed)
Closing encounter, form was picked up.

## 2021-10-20 DIAGNOSIS — Z0289 Encounter for other administrative examinations: Secondary | ICD-10-CM

## 2021-10-28 ENCOUNTER — Ambulatory Visit (INDEPENDENT_AMBULATORY_CARE_PROVIDER_SITE_OTHER): Payer: Medicare Other | Admitting: Nurse Practitioner

## 2021-10-28 ENCOUNTER — Encounter: Payer: Self-pay | Admitting: Nurse Practitioner

## 2021-10-28 VITALS — BP 120/80 | HR 86 | Temp 98.7°F | Ht 73.0 in | Wt 245.0 lb

## 2021-10-28 DIAGNOSIS — S50861A Insect bite (nonvenomous) of right forearm, initial encounter: Secondary | ICD-10-CM | POA: Diagnosis not present

## 2021-10-28 DIAGNOSIS — W57XXXA Bitten or stung by nonvenomous insect and other nonvenomous arthropods, initial encounter: Secondary | ICD-10-CM | POA: Diagnosis not present

## 2021-10-28 MED ORDER — HYDROCORTISONE 1 % EX CREA: 1.0000 | TOPICAL_CREAM | Freq: Two times a day (BID) | CUTANEOUS | 0 refills | Status: AC

## 2021-10-28 MED ORDER — ACETAMINOPHEN 500 MG PO TABS: 500.0000 mg | ORAL_TABLET | Freq: Four times a day (QID) | ORAL | 0 refills | Status: AC | PRN

## 2021-10-28 NOTE — Patient Instructions (Signed)
Insect Bite, Adult An insect bite can make your skin red, itchy, and swollen. Some insects can spread disease to people with a bite. However, most insect bites do not lead to disease, and most are not serious. What are the causes? Insects may bite for many reasons, including: Hunger. To defend themselves. Insects that bite include: Spiders. Mosquitoes. Ticks. Fleas. Ants. Flies. Kissing bugs. Chiggers. What are the signs or symptoms? Symptoms of this condition include: Itching or pain in the bite area. Redness and swelling in the bite area. An open wound (skin ulcer). Symptoms often last for 2-4 days. In rare cases, a person may have a very bad allergic reaction (anaphylactic reaction) to a bite. Symptoms of an anaphylactic reaction may include: Feeling warm in the face (flushed). Your face may turn red. Itchy, red, swollen areas of skin (hives). Swelling of the: Eyes. Lips. Face. Mouth. Tongue. Throat. Trouble with any of these: Breathing. Talking. Swallowing. Loud breathing (wheezing). Feeling dizzy or light-headed. Passing out (fainting). Pain or cramps in your belly. Throwing up (vomiting). Watery poop (diarrhea). How is this treated? Treatment is usually not needed. Symptoms often go away on their own. When treatment is needed, it may involve: Putting a cream or lotion on the bite area. This helps with itching. Taking an antibiotic medicine. This treatment is needed if the bite area gets infected. Getting a tetanus shot, if you are not up to date on this vaccine. Putting ice on the affected area. Using medicines called antihistamines. This treatment may be needed if you have itching or an allergic reaction to the insect bite. Giving yourself a shot of medicine (epinephrine) using an auto-injector "pen" if you have an anaphylactic reaction to a bite. Your doctor will teach you how to use this pen. Follow these instructions at home: Bite area care  Do not  scratch the bite area. Keep the bite area clean and dry. Wash the bite area every day with soap and water as told by your doctor. Check the bite area every day for signs of infection. Check for: Redness, swelling, or pain. Fluid or blood. Warmth. Pus or a bad smell. Managing pain, itching, and swelling  You may put any of these on the bite area as told by your doctor: A paste made of baking soda and water. Cortisone cream. Calamine lotion. If told, put ice on the bite area. Put ice in a plastic bag. Place a towel between your skin and the bag. Leave the ice on for 20 minutes, 2-3 times a day. General instructions Apply or take over-the-counter and prescription medicines only as told by your doctor. If you were prescribed an antibiotic medicine, take or apply it as told by your doctor. Do not stop using the antibiotic even if your condition improves. Keep all follow-up visits as told by your doctor. This is important. How is this prevented? To help you have a lower risk of insect bites: When you are outside, wear clothing that covers your arms and legs. Use insect repellent. The best insect repellents contain one of these: DEET. Picaridin. Oil of lemon eucalyptus (OLE). [id number]. Consider spraying your clothing with a pesticide called permethrin. Permethrin helps prevent insect bites. It works for several weeks and for up to 5-6 clothing washes. Do not apply permethrin directly to the skin. If your home windows do not have screens, think about putting some in. If you will be sleeping in an area where there are mosquitoes, consider covering your sleeping area with a mosquito  net. Contact a doctor if: You have redness, swelling, or pain in the bite area. You have fluid or blood coming from the bite area. The bite area feels warm to the touch. You have pus or a bad smell coming from the bite area. You have a fever. Get help right away if: You have joint pain. You have a rash. You  feel more tired or sleepy than you normally do. You have neck pain. You have a headache. You feel weaker than you normally do. You have signs of an anaphylactic reaction. Signs may include: Feeling warm in the face. Itchy, red, swollen areas of skin. Swelling of your: Eyes. Lips. Face. Mouth. Tongue. Throat. Trouble with any of these: Breathing. Talking. Swallowing. Loud breathing. Feeling dizzy or light-headed. Passing out. Pain or cramps in your belly. Throwing up. Watery poop. These symptoms may be an emergency. Do not wait to see if the symptoms will go away. Do this right away: Use your auto-injector pen as you have been told. Get medical help. Call your local emergency services (911 in the [place] Do not drive yourself to the hospital. Summary An insect bite can make your skin red, itchy, and swollen. Treatment is usually not needed. Symptoms often go away on their own. Do not scratch the bite area. Keep it clean and dry. Ice can help with pain and itching from the bite. This information is not intended to replace advice given to you by your health care provider. Make sure you discuss any questions you have with your health care provider. Document Revised: 01/06/2021 Document Reviewed: 01/06/2021 Elsevier Patient Education  [place].

## 2021-10-28 NOTE — Progress Notes (Signed)
Acute Office Visit  Subjective:     Patient ID: [name], male    DOB: 1955-11-27, 66 y.o.   MRN: [id number]  Chief Complaint  Patient presents with   Insect Bite    [name] - states his whole arm hurts    HPI  Patient presents with bug bite on right forearm in the past 24 hours.  With symptoms of erythema, mild tenderness and pruritus.  No fever, no signs and symptoms of skin infection.  Rash is scabbed over.  Patient denies fever, headache or any other associated symptoms.   Review of Systems  Constitutional: Negative.   HENT: Negative.    Eyes: Negative.   Respiratory: Negative.    Cardiovascular: Negative.   Gastrointestinal: Negative.   Genitourinary: Negative.   Skin:  Positive for itching and rash.  Neurological: Negative.   All other systems reviewed and are negative.       Objective:    BP 120/80   Pulse 86   Temp 98.7 F (37.1 C)   Ht '6\' 1"'$  (1.854 m)   Wt 245 lb (111.1 kg)   SpO2 90%   BMI 32.32 kg/m  BP Readings from Last 3 Encounters:  10/28/21 120/80  09/10/21 122/78  09/01/21 129/75   Wt Readings from Last 3 Encounters:  10/28/21 245 lb (111.1 kg)  09/10/21 255 lb (115.7 kg)  09/01/21 253 lb 12.8 oz (115.1 kg)      Physical Exam Vitals and nursing note reviewed.  Constitutional:      Appearance: Normal appearance.  HENT:     Head: Normocephalic.     Nose: Nose normal.     Mouth/Throat:     Mouth: Mucous membranes are moist.     Pharynx: Oropharynx is clear.  Eyes:     Conjunctiva/sclera: Conjunctivae normal.  Pulmonary:     Effort: Pulmonary effort is normal.  Abdominal:     General: Bowel sounds are normal.  Musculoskeletal:        General: Normal range of motion.  Skin:    General: Skin is warm and dry.     Findings: Erythema and rash present.  Neurological:     General: No focal deficit present.     Mental Status: He is alert and oriented to person, place, and time.  Psychiatric:        Mood and Affect: Mood  normal.        Behavior: Behavior normal.     No results found for any visits on 10/28/21.      Assessment & Plan:  Patient presents with bug bite in the past 24 hours.  Patient not aware what type of bug, no signs and symptoms of skin infection at this time, mild tenderness, erythema and pruritus present.  Topical hydrocortisone cream 1% . Avoid scratching with nails.,  Tylenol for pain/fever as needed.  Educated patient to watch out for signs and symptoms of infection. Follow-up with worsening unresolved symptoms. Problem List Items Addressed This Visit   None Visit Diagnoses     Bug bite, initial encounter    -  Primary   Relevant Medications   acetaminophen (TYLENOL) 500 MG tablet   hydrocortisone cream 1 %       Meds ordered this encounter  Medications   acetaminophen (TYLENOL) 500 MG tablet    Sig: Take 1 tablet (500 mg total) by mouth every 6 (six) hours as needed.    Dispense:  30 tablet    Refill:  0    Order Specific Question:   Supervising Provider    Answer:   [name] [[id number]]   hydrocortisone cream 1 %    Sig: Apply 1 Application topically 2 (two) times daily.    Dispense:  30 g    Refill:  0    Order Specific Question:   Supervising Provider    Answer:   [name] [[id number]]    Return if symptoms worsen or fail to improve.  Ivy Lynn, NP

## 2021-11-03 ENCOUNTER — Ambulatory Visit (INDEPENDENT_AMBULATORY_CARE_PROVIDER_SITE_OTHER): Payer: Medicare Other | Admitting: *Deleted

## 2021-11-03 DIAGNOSIS — E538 Deficiency of other specified B group vitamins: Secondary | ICD-10-CM

## 2021-11-05 DIAGNOSIS — G809 Cerebral palsy, unspecified: Secondary | ICD-10-CM | POA: Diagnosis not present

## 2021-11-07 ENCOUNTER — Ambulatory Visit: Payer: Medicare Other

## 2021-11-11 ENCOUNTER — Ambulatory Visit: Payer: Self-pay | Admitting: *Deleted

## 2021-11-11 DIAGNOSIS — G809 Cerebral palsy, unspecified: Secondary | ICD-10-CM

## 2021-11-11 NOTE — Chronic Care Management (AMB) (Signed)
  Chronic Care Management   Note  11/11/2021 Name: [name] MRN: [id number] DOB: [birth year]   Patient has either met RN Care Management goals, is stable from [facility] Management perspective, or has not recently engaged with the RN Care Manager. I am removing RN Care Manager from Care Team and closing [place]. If patient is currently engaged with another CCM team member I will forward this encounter to inform them of my case closure. Their PCP can place a new referral if the patient needs Care Management or Care Coordination services in the future.  [name], BSN, RN-BC Embedded Chronic Care Manager [facility] / [place] Management Direct Dial: [phone]

## 2021-11-11 NOTE — Patient Instructions (Signed)
[name]  I have enjoyed working with you through the Chronic Care Management Program at [facility]. Due to program changes I am removing myself from your care team because you've either met our goals, your conditions are stable and no longer require care management, or we haven't engaged within the past 6 months. If you are currently active with another CCM Team Member, you will remain active with them unless they reach out to you with additional information. If you feel that you need RN Care Management or Care Coordination services in the future, please talk with your primary care provider and request a new referral for Care Management or Care Coordination. This does not affect your status as a patient at [facility].   Thank you for allowing me to participate in your your healthcare journey.  [name], BSN, RN-BC Embedded Chronic Care Manager [facility] / [place] [facility] Direct Dial: [phone]

## 2021-11-12 ENCOUNTER — Telehealth: Payer: Self-pay | Admitting: Cardiology

## 2021-11-12 NOTE — Telephone Encounter (Signed)
Pt and his [place] would like a call back regarding Home Sleep Study. Nurse states that pt does not drive and would like to know if there is any way that sleep study can be delivered. Please advise

## 2021-12-03 ENCOUNTER — Other Ambulatory Visit: Payer: Self-pay | Admitting: Family Medicine

## 2021-12-05 ENCOUNTER — Ambulatory Visit (INDEPENDENT_AMBULATORY_CARE_PROVIDER_SITE_OTHER): Payer: Medicare Other

## 2021-12-05 DIAGNOSIS — E538 Deficiency of other specified B group vitamins: Secondary | ICD-10-CM

## 2021-12-05 NOTE — Progress Notes (Signed)
Cyanocobalamin injection given to right deltoid.  Patient tolerated well. 

## 2022-01-01 NOTE — Telephone Encounter (Signed)
Deferred to sleep lab.

## 2022-01-05 ENCOUNTER — Ambulatory Visit (INDEPENDENT_AMBULATORY_CARE_PROVIDER_SITE_OTHER): Payer: Medicare Other | Admitting: Nurse Practitioner

## 2022-01-05 ENCOUNTER — Encounter: Payer: Self-pay | Admitting: Nurse Practitioner

## 2022-01-05 ENCOUNTER — Ambulatory Visit (INDEPENDENT_AMBULATORY_CARE_PROVIDER_SITE_OTHER): Payer: Medicare Other | Admitting: *Deleted

## 2022-01-05 VITALS — BP 132/76 | HR 72 | Temp 97.4°F | Resp 20 | Ht 73.0 in | Wt 249.0 lb

## 2022-01-05 DIAGNOSIS — E538 Deficiency of other specified B group vitamins: Secondary | ICD-10-CM

## 2022-01-05 DIAGNOSIS — L03116 Cellulitis of left lower limb: Secondary | ICD-10-CM

## 2022-01-05 MED ORDER — CEPHALEXIN 500 MG PO CAPS: 500.0000 mg | ORAL_CAPSULE | Freq: Three times a day (TID) | ORAL | 0 refills | Status: DC

## 2022-01-05 MED ORDER — TRIAMCINOLONE ACETONIDE 0.1 % EX CREA: 1.0000 | TOPICAL_CREAM | Freq: Two times a day (BID) | CUTANEOUS | 0 refills | Status: DC

## 2022-01-05 NOTE — Progress Notes (Signed)
   Subjective:    Patient ID: [name], male    DOB: 12/29/1955, 66 y.o.   MRN: [id number]   Chief Complaint: Rash (Left leg)   Rash Pertinent negatives include no eye pain or shortness of breath.   Patient has a rash on his left lower leg. Started several days ago. No worse. Was itching at first but no longer itching now.    Review of Systems  Constitutional:  Negative for diaphoresis.  Eyes:  Negative for pain.  Respiratory:  Negative for shortness of breath.   Cardiovascular:  Negative for chest pain, palpitations and leg swelling.  Gastrointestinal:  Negative for abdominal pain.  Endocrine: Negative for polydipsia.  Skin:  Positive for rash.  Neurological:  Negative for dizziness, weakness and headaches.  Hematological:  Does not bruise/bleed easily.  All other systems reviewed and are negative.      Objective:   Physical Exam Vitals and nursing note reviewed.  Constitutional:      Appearance: Normal appearance. He is well-developed.  Neck:     Thyroid: No thyroid mass or thyromegaly.     Vascular: No carotid bruit or JVD.     Trachea: Phonation normal.  Cardiovascular:     Rate and Rhythm: Normal rate and regular rhythm.  Pulmonary:     Effort: Pulmonary effort is normal. No respiratory distress.     Breath sounds: Normal breath sounds.  Abdominal:     General: Bowel sounds are normal.     Palpations: Abdomen is soft.     Tenderness: There is no abdominal tenderness.  Musculoskeletal:        General: Normal range of motion.     Cervical back: Normal range of motion and neck supple.  Lymphadenopathy:     Cervical: No cervical adenopathy.  Skin:    General: Skin is warm and dry.  Neurological:     Mental Status: He is alert and oriented to person, place, and time.  Psychiatric:        Behavior: Behavior normal.        Thought Content: Thought content normal.        Judgment: Judgment normal.            Assessment & Plan:   [name]  in today with chief complaint of Rash (Left leg)   1. Cellulitis of left lower leg Do not pick or scratch at area Take meds as prescribed  Mary-Margaret Hassell Done, FNP     The above assessment and management plan was discussed with the patient. The patient verbalized understanding of and has agreed to the management plan. Patient is aware to call the clinic if symptoms persist or worsen. Patient is aware when to return to the clinic for a follow-up visit. Patient educated on when it is appropriate to go to the emergency department.   Mary-Margaret Hassell Done, FNP

## 2022-01-05 NOTE — Progress Notes (Signed)
Vitamin b12 injection given and patient tolerated well.  

## 2022-01-17 ENCOUNTER — Other Ambulatory Visit: Payer: Self-pay | Admitting: Nurse Practitioner

## 2022-01-17 DIAGNOSIS — I25118 Atherosclerotic heart disease of native coronary artery with other forms of angina pectoris: Secondary | ICD-10-CM

## 2022-01-17 DIAGNOSIS — G4701 Insomnia due to medical condition: Secondary | ICD-10-CM

## 2022-01-17 DIAGNOSIS — F3341 Major depressive disorder, recurrent, in partial remission: Secondary | ICD-10-CM

## 2022-01-17 DIAGNOSIS — I209 Angina pectoris, unspecified: Secondary | ICD-10-CM

## 2022-01-29 ENCOUNTER — Encounter: Payer: Self-pay | Admitting: Family Medicine

## 2022-01-29 ENCOUNTER — Ambulatory Visit (INDEPENDENT_AMBULATORY_CARE_PROVIDER_SITE_OTHER): Payer: Medicare Other | Admitting: Family Medicine

## 2022-01-29 VITALS — BP 113/66 | HR 75 | Temp 97.2°F | Ht 73.0 in | Wt 248.2 lb

## 2022-01-29 DIAGNOSIS — E785 Hyperlipidemia, unspecified: Secondary | ICD-10-CM | POA: Diagnosis not present

## 2022-01-29 DIAGNOSIS — E538 Deficiency of other specified B group vitamins: Secondary | ICD-10-CM | POA: Diagnosis not present

## 2022-01-29 DIAGNOSIS — Z23 Encounter for immunization: Secondary | ICD-10-CM | POA: Diagnosis not present

## 2022-01-29 DIAGNOSIS — I10 Essential (primary) hypertension: Secondary | ICD-10-CM

## 2022-01-29 NOTE — Progress Notes (Signed)
Subjective:  Patient ID: [name], male    DOB: Aug 26, 1955  Age: 66 y.o. MRN: [id number]  CC: Medical Management of Chronic Issues   HPI [name] presents for orange and blue (pills) taken together caused him to not know if he was going or coming. Now taking the blu at bedtime and the orange in the afternoon and the yellow in the morning. Thisworks well for him. No more stumbling around. His aide and the person at the drug store got it straightened out for him.   Breathing in the blue and white round one, but not getting anything out of it. Got more from the red one & wants to go back to it.   Patient in for follow-up of GERD. Currently asymptomatic taking  PPI daily. There is no chest pain or heartburn. No hematemesis and no melena. No dysphagia or choking. Onset is remote. Progression is stable. Complicating factors, none.   in for follow-up of elevated cholesterol. Doing well without complaints on current medication. Denies side effects of statin including myalgia and arthralgia and nausea. Currently no chest pain, shortness of breath or other cardiovascular related symptoms noted.      01/29/2022   10:08 AM 01/05/2022   10:44 AM 10/28/2021    8:19 AM  Depression screen PHQ 2/9  Decreased Interest 0 0 0  Down, Depressed, Hopeless 0 0 0  PHQ - 2 Score 0 0 0    History [name] has a past medical history of Cerebral palsy ([place]), Coronary artery disease, Depression, GERD (gastroesophageal reflux disease), Hypertension, Scoliosis, and Seizures ([place]).   He has a past surgical history that includes Coronary angioplasty with stent; Carpal tunnel release (Right, 08/13/2016); and LEFT HEART CATH AND CORONARY ANGIOGRAPHY (N/A, 07/10/2017).   His family history includes Alcohol abuse in his brother; CAD in his brother and sister; CAD (age of onset: 106) in his father; Diabetes in his mother; Heart disease in his father; Heart failure in his sister.He reports that he quit smoking about  3 years ago. His smoking use included cigarettes. He has a 20.50 pack-year smoking history. He has never used smokeless tobacco. He reports current alcohol use. He reports that he does not use drugs.    ROS Review of Systems  Constitutional:  Negative for fever.  Respiratory:  Negative for cough, chest tightness and shortness of breath.   Cardiovascular:  Negative for chest pain and palpitations.  Gastrointestinal:  Negative for abdominal pain.  Musculoskeletal:  Negative for arthralgias.  Skin:  Negative for rash.    Objective:  BP 113/66   Pulse 75   Temp (!) 97.2 F (36.2 C)   Ht '6\' 1"'  (1.854 m)   Wt 248 lb 3.2 oz (112.6 kg)   SpO2 92%   BMI 32.75 kg/m   BP Readings from Last 3 Encounters:  01/29/22 113/66  01/05/22 132/76  10/28/21 120/80    Wt Readings from Last 3 Encounters:  01/29/22 248 lb 3.2 oz (112.6 kg)  01/05/22 249 lb (112.9 kg)  10/28/21 245 lb (111.1 kg)     Physical Exam Vitals reviewed.  Constitutional:      Appearance: He is well-developed.  HENT:     Head: Normocephalic and atraumatic.     Right Ear: External ear normal.     Left Ear: External ear normal.     Mouth/Throat:     Pharynx: No oropharyngeal exudate or posterior oropharyngeal erythema.  Eyes:     Pupils: Pupils are  equal, round, and reactive to light.  Cardiovascular:     Rate and Rhythm: Normal rate and regular rhythm.     Heart sounds: No murmur heard. Pulmonary:     Effort: No respiratory distress.     Breath sounds: Normal breath sounds.  Musculoskeletal:     Cervical back: Normal range of motion and neck supple.  Neurological:     Mental Status: He is alert and oriented to person, place, and time.       Assessment & Plan:   Caryl was seen today for medical management of chronic issues.  Diagnoses and all orders for this visit:  B12 deficiency -     Vitamin B12  Essential hypertension -     CBC with Differential/Platelet -     CMP14+EGFR  Dyslipidemia,  goal LDL below 70 -     Lipid panel  Need for influenza vaccination -     Flu Vaccine QUAD 6+ mos PF IM (Fluarix Quad PF)       I am having [name] maintain his cetaphil, aspirin EC, fexofenadine, fluocinonide-emollient, fluticasone, tamsulosin, Trelegy Ellipta, albuterol, metoprolol tartrate, doxycycline, buPROPion, DULoxetine, nitroGLYCERIN, atorvastatin, pantoprazole, acetaminophen, hydrocortisone cream, cephALEXin, triamcinolone cream, isosorbide mononitrate, and mirtazapine. We administered cyanocobalamin. We will continue to administer cyanocobalamin.  Allergies as of 01/29/2022       Reactions   Chantix [varenicline Tartrate] Nausea And Vomiting        Medication List        Accurate as of January 29, 2022 11:59 PM. If you have any questions, ask your nurse or doctor.          acetaminophen 500 MG tablet Commonly known as: TYLENOL Take 1 tablet (500 mg total) by mouth every 6 (six) hours as needed.   albuterol 108 (90 Base) MCG/ACT inhaler Commonly known as: VENTOLIN HFA Inhale 2 puffs into the lungs every 6 (six) hours as needed for wheezing or shortness of breath.   aspirin EC 81 MG tablet Take 1 tablet (81 mg total) by mouth daily.   atorvastatin 40 MG tablet Commonly known as: LIPITOR TAKE 1 TABLET ONCE DAILY AT 6PM   buPROPion 150 MG 12 hr tablet Commonly known as: WELLBUTRIN SR TAKE (1) TABLET DAILY WITH BREAKFAST.   cephALEXin 500 MG capsule Commonly known as: Keflex Take 1 capsule (500 mg total) by mouth 3 (three) times daily.   cetaphil lotion Apply 1 application topically 2 (two) times daily. For rash. Apply after washing with [facility] shower gel and rinsing with clear warm water.   doxycycline 100 MG tablet Commonly known as: VIBRA-TABS Take 1 tablet (100 mg total) by mouth 2 (two) times daily.   DULoxetine 60 MG capsule Commonly known as: CYMBALTA TAKE 1 CAPSULE ONCE DAILY WITH LARGEST MEAL   fexofenadine 180 MG  tablet Commonly known as: ALLEGRA Take 1 tablet (180 mg total) by mouth daily. For allergy symptoms   fluocinonide-emollient 0.05 % cream Commonly known as: LIDEX-E Apply 1 application topically 2 (two) times daily. To affected areas   fluticasone 50 MCG/ACT nasal spray Commonly known as: FLONASE Place 2 sprays into both nostrils daily as needed for allergies or rhinitis.   hydrocortisone cream 1 % Apply 1 Application topically 2 (two) times daily.   isosorbide mononitrate 60 MG 24 hr tablet Commonly known as: IMDUR TAKE 1 TABLET DAILY   metoprolol tartrate 25 MG tablet Commonly known as: LOPRESSOR TAKE (1) TABLET TWICE A DAY.   mirtazapine 15 MG  tablet Commonly known as: REMERON TAKE 1 TABLET AT BEDTIME AS NEEDED FOR SLEEP   nitroGLYCERIN 0.4 MG SL tablet Commonly known as: NITROSTAT Place 1 tablet (0.4 mg total) under the tongue every 5 (five) minutes x 3 doses as needed for chest pain.   pantoprazole 40 MG tablet Commonly known as: PROTONIX TAKE (1) TABLET TWICE A DAY.   tamsulosin 0.4 MG Caps capsule Commonly known as: FLOMAX TAKE (2) CAPSULES DAILY AFTER SUPPER.   Trelegy Ellipta 100-62.5-25 MCG/ACT Aepb Generic drug: Fluticasone-Umeclidin-Vilant Inhale 1 puff into the lungs daily.   triamcinolone cream 0.1 % Commonly known as: KENALOG Apply 1 Application topically 2 (two) times daily.         Follow-up: Return in about 6 months (around 07/31/2022).  [name], M.D.

## 2022-01-30 LAB — CBC WITH DIFFERENTIAL/PLATELET
Basophils Absolute: 0.1 10*3/uL (ref 0.0–0.2)
Basos: 1 %
EOS (ABSOLUTE): 1 10*3/uL — ABNORMAL HIGH (ref 0.0–0.4)
Eos: 9 %
Hematocrit: 44.2 % (ref 37.5–51.0)
Hemoglobin: 15.6 g/dL (ref 13.0–17.7)
Immature Grans (Abs): 0 10*3/uL (ref 0.0–0.1)
Immature Granulocytes: 0 %
Lymphocytes Absolute: 4.2 10*3/uL — ABNORMAL HIGH (ref 0.7–3.1)
Lymphs: 36 %
MCH: 34.3 pg — ABNORMAL HIGH (ref 26.6–33.0)
MCHC: 35.3 g/dL (ref 31.5–35.7)
MCV: 97 fL (ref 79–97)
Monocytes Absolute: 0.6 10*3/uL (ref 0.1–0.9)
Monocytes: 6 %
Neutrophils Absolute: 5.5 10*3/uL (ref 1.4–7.0)
Neutrophils: 48 %
Platelets: 234 10*3/uL (ref 150–450)
RBC: 4.55 x10E6/uL (ref 4.14–5.80)
RDW: 12.2 % (ref 11.6–15.4)
WBC: 11.5 10*3/uL — ABNORMAL HIGH (ref 3.4–10.8)

## 2022-01-30 LAB — CMP14+EGFR
ALT: 11 IU/L (ref 0–44)
AST: 14 IU/L (ref 0–40)
Albumin/Globulin Ratio: 1.5 (ref 1.2–2.2)
Albumin: 4.3 g/dL (ref 3.9–4.9)
Alkaline Phosphatase: 88 IU/L (ref 44–121)
BUN/Creatinine Ratio: 11 (ref 10–24)
BUN: 10 mg/dL (ref 8–27)
Bilirubin Total: 0.6 mg/dL (ref 0.0–1.2)
CO2: 31 mmol/L — ABNORMAL HIGH (ref 20–29)
Calcium: 9.4 mg/dL (ref 8.6–10.2)
Chloride: 96 mmol/L (ref 96–106)
Creatinine, Ser: 0.91 mg/dL (ref 0.76–1.27)
Globulin, Total: 2.9 g/dL (ref 1.5–4.5)
Glucose: 95 mg/dL (ref 70–99)
Potassium: 4.8 mmol/L (ref 3.5–5.2)
Sodium: 139 mmol/L (ref 134–144)
Total Protein: 7.2 g/dL (ref 6.0–8.5)
eGFR: 94 mL/min/{1.73_m2} (ref >59)

## 2022-01-30 LAB — LIPID PANEL
Chol/HDL Ratio: 4.2 ratio (ref 0.0–5.0)
Cholesterol, Total: 114 mg/dL (ref 100–199)
HDL: 27 mg/dL — ABNORMAL LOW (ref >39)
LDL Chol Calc (NIH): 64 mg/dL (ref 0–99)
Triglycerides: 130 mg/dL (ref 0–149)
VLDL Cholesterol Cal: 23 mg/dL (ref 5–40)

## 2022-01-30 LAB — VITAMIN B12: Vitamin B-12: >2000 pg/mL — ABNORMAL HIGH (ref 232–1245)

## 2022-01-31 NOTE — Progress Notes (Signed)
Hello Jeramih,  Your lab result is normal and/or stable.Some minor variations that are not significant are commonly marked abnormal, but do not represent any medical problem for you.  Best regards, [name], M.D.

## 2022-02-03 ENCOUNTER — Other Ambulatory Visit: Payer: Self-pay | Admitting: Family Medicine

## 2022-02-03 DIAGNOSIS — I10 Essential (primary) hypertension: Secondary | ICD-10-CM

## 2022-02-03 DIAGNOSIS — I209 Angina pectoris, unspecified: Secondary | ICD-10-CM

## 2022-02-03 DIAGNOSIS — I25118 Atherosclerotic heart disease of native coronary artery with other forms of angina pectoris: Secondary | ICD-10-CM

## 2022-02-25 ENCOUNTER — Ambulatory Visit (INDEPENDENT_AMBULATORY_CARE_PROVIDER_SITE_OTHER): Payer: Medicare Other | Admitting: *Deleted

## 2022-02-25 DIAGNOSIS — Z0000 Encounter for general adult medical examination without abnormal findings: Secondary | ICD-10-CM

## 2022-02-25 NOTE — Progress Notes (Signed)
MEDICARE ANNUAL WELLNESS VISIT  02/25/2022  Telephone Visit Disclaimer This Medicare AWV was conducted by telephone due to national recommendations for restrictions regarding the COVID-19 Pandemic (e.g. social distancing).  I verified, using two identifiers, that I am speaking with [name] or their authorized healthcare agent. I discussed the limitations, risks, security, and privacy concerns of performing an evaluation and management service by telephone and the potential availability of an in-person appointment in the future. The patient expressed understanding and agreed to proceed.  Location of Patient: Home Location of Provider (nurse):  office  Subjective:    [name] is a 66 y.o. male patient of [name], [name], MD who had a Medicare Annual Wellness Visit today via telephone. [name] is Disabled and lives alone with his dog. he has 0 children. he reports that he is socially active and does interact with friends/family regularly. he is moderately physically active and enjoys walking, playing with his dog and going to [place].  Patient Care Team: [name], MD as PCP - General (Family Medicine) Minus Breeding, MD as PCP - Cardiology (Cardiology)     02/25/2022   11:21 AM 09/11/2020    3:03 PM 05/31/2020   12:10 PM 08/23/2019   10:26 AM 08/22/2018   10:21 AM 06/27/2018    4:45 PM 06/27/2018   11:55 AM  Advanced Directives  Does Patient Have a Medical Advance Directive? No Yes No No No No No  Type of Social research officer, government;Living will       Copy of [place] in Chart?  No - copy requested       Would patient like information on creating a medical advance directive? No - Patient declined   No - Patient declined Yes (MAU/Ambulatory/Procedural Areas - Information given) No - Patient declined     Hospital Utilization Over the Past 12 Months: # of hospitalizations or ER visits: 0 # of surgeries: 0  Review of Systems     Patient reports that his overall health is unchanged compared to last year.  History obtained from chart review and the patient  Patient Reported Readings (BP, Pulse, CBG, Weight, etc) none  Pain Assessment Pain : No/denies pain     Current Medications & Allergies (verified) Allergies as of 02/25/2022       Reactions   Chantix [varenicline Tartrate] Nausea And Vomiting        Medication List        Accurate as of February 25, 2022 12:24 PM. If you have any questions, ask your nurse or doctor.          STOP taking these medications    cephALEXin 500 MG capsule Commonly known as: Keflex   doxycycline 100 MG tablet Commonly known as: VIBRA-TABS       TAKE these medications    acetaminophen 500 MG tablet Commonly known as: TYLENOL Take 1 tablet (500 mg total) by mouth every 6 (six) hours as needed.   albuterol 108 (90 Base) MCG/ACT inhaler Commonly known as: VENTOLIN HFA Inhale 2 puffs into the lungs every 6 (six) hours as needed for wheezing or shortness of breath.   aspirin EC 81 MG tablet Take 1 tablet (81 mg total) by mouth daily.   atorvastatin 40 MG tablet Commonly known as: LIPITOR TAKE 1 TABLET ONCE DAILY AT 6PM   buPROPion 150 MG 12 hr tablet Commonly known as: WELLBUTRIN SR TAKE (1) TABLET DAILY WITH BREAKFAST.   cetaphil  lotion Apply 1 application topically 2 (two) times daily. For rash. Apply after washing with [facility] shower gel and rinsing with clear warm water.   DULoxetine 60 MG capsule Commonly known as: CYMBALTA TAKE 1 CAPSULE ONCE DAILY WITH LARGEST MEAL   fexofenadine 180 MG tablet Commonly known as: ALLEGRA Take 1 tablet (180 mg total) by mouth daily. For allergy symptoms   fluocinonide-emollient 0.05 % cream Commonly known as: LIDEX-E Apply 1 application topically 2 (two) times daily. To affected areas   fluticasone 50 MCG/ACT nasal spray Commonly known as: FLONASE Place 2 sprays into both nostrils daily as needed  for allergies or rhinitis.   hydrocortisone cream 1 % Apply 1 Application topically 2 (two) times daily.   isosorbide mononitrate 60 MG 24 hr tablet Commonly known as: IMDUR TAKE 1 TABLET DAILY   metoprolol tartrate 25 MG tablet Commonly known as: LOPRESSOR TAKE (1) TABLET TWICE A DAY.   mirtazapine 15 MG tablet Commonly known as: REMERON TAKE 1 TABLET AT BEDTIME AS NEEDED FOR SLEEP   nitroGLYCERIN 0.4 MG SL tablet Commonly known as: NITROSTAT Place 1 tablet (0.4 mg total) under the tongue every 5 (five) minutes x 3 doses as needed for chest pain.   pantoprazole 40 MG tablet Commonly known as: PROTONIX TAKE (1) TABLET TWICE A DAY.   tamsulosin 0.4 MG Caps capsule Commonly known as: FLOMAX TAKE (2) CAPSULES DAILY AFTER SUPPER.   Trelegy Ellipta 100-62.5-25 MCG/ACT Aepb Generic drug: Fluticasone-Umeclidin-Vilant Inhale 1 puff into the lungs daily.   triamcinolone cream 0.1 % Commonly known as: KENALOG Apply 1 Application topically 2 (two) times daily.        History (reviewed): Past Medical History:  Diagnosis Date   Cerebral palsy ([place])    Coronary artery disease    LAD 70% stenosis, cath 2019   Depression    GERD (gastroesophageal reflux disease)    Hypertension    Scoliosis    Seizures ([place])    pt's sister states that patinet has had seizures in the past, pt camr for PAT by himself on last visit and she stst "they misunderstood what he was trying to say. Pt saw neurologist in March who did CT and states patient does not have seizures. On no meds, had seizures as child.   Past Surgical History:  Procedure Laterality Date   CARPAL TUNNEL RELEASE Right 08/13/2016   Procedure: CARPAL TUNNEL RELEASE;  Surgeon: Carole Civil, MD;  Location: [facility];  Service: Orthopedics;  Laterality: Right;   CORONARY ANGIOPLASTY WITH STENT PLACEMENT     LEFT HEART CATH AND CORONARY ANGIOGRAPHY N/A 07/10/2017   Procedure: LEFT HEART CATH AND CORONARY ANGIOGRAPHY;  Surgeon:  Troy Sine, MD;  Location: [facility];  Service: Cardiovascular;  Laterality: N/A;   Family History  Problem Relation Age of Onset   CAD Father 50       CABG   Heart disease Father    CAD Brother    Diabetes Mother    Alcohol abuse Brother    CAD Sister    Heart failure Sister    Social History   Socioeconomic History   Marital status: Single    Spouse name: Not on file   Number of children: 0   Years of education: 12   Highest education level: High school graduate  Occupational History   Occupation: disabled  Tobacco Use   Smoking status: Every Day    Packs/day: 0.50    Years: 41.00  Total pack years: 20.50    Types: Cigarettes    Last attempt to quit: 06/06/2018    Years since quitting: 3.7   Smokeless tobacco: Never  Vaping Use   Vaping Use: Never used  Substance and Sexual Activity   Alcohol use: Yes    Comment: "once in a blue moon" typically drinks beer 2 weeks or longer   Drug use: No   Sexual activity: Never    Birth control/protection: None  Other Topics Concern   Not on file  Social History Narrative   08/05/2018   Patient lives alone in a trailer. His sister [name] will take take him to the grocery store or bring things by for him. He usually sees her twice a week. His "baby sister" also checks in on him. He is able to perform most ADLs but is illiterate. He does not drive and depends on family or arranged transportation - usually [name]. States that his landlord are nice and lookout for him. He has an aide that comes 5 days per week who helps him bathe, shop, pay bills, etc.   Social Determinants of Health   Financial Resource Strain: Low Risk  (02/25/2022)   Overall Financial Resource Strain (CARDIA)    Difficulty of Paying Living Expenses: Not hard at all  Food Insecurity: No Food Insecurity (02/25/2022)   Hunger Vital Sign    Worried About Running Out of Food in the Last Year: Never true    [name] in the Last Year: Never true   Transportation Needs: No Transportation Needs (02/25/2022)   PRAPARE - Hydrologist (Medical): No    Lack of Transportation (Non-Medical): No  Physical Activity: Sufficiently Active (02/25/2022)   Exercise Vital Sign    Days of Exercise per Week: 7 days    Minutes of Exercise per Session: 30 min  Stress: No Stress Concern Present (02/25/2022)   [place]    Feeling of Stress : Not at all  Social Connections: Moderately Integrated (02/25/2022)   Social Connection and Isolation Panel [NHANES]    Frequency of Communication with Friends and Family: Three times a week    Frequency of Social Gatherings with Friends and Family: Once a week    Attends Religious Services: More than 4 times per year    Active Member of [employer] or Organizations: Yes    Attends Archivist Meetings: More than 4 times per year    Marital Status: Never married    Activities of Daily Living    02/25/2022   11:22 AM  In your present state of health, do you have any difficulty performing the following activities:  Hearing? 0  Vision? 1  Comment pt wears rx glasses  Difficulty concentrating or making decisions? 0  Walking or climbing stairs? 0  Dressing or bathing? Cook comes in and helps him  Doing errands, shopping? 1  Comment pt has to have Copywriter, advertising and eating ? Y  Comment has someone to come in and help prepare his meals but he can feed himself  Using the Toilet? N  In the past six months, have you accidently leaked urine? Y  Comment he wears adult "pull ups" when he is out of the house  Do you have problems with loss of bowel control? N  Managing your Medications? [place] comes in to help with his medications  Managing your Finances? N  Housekeeping or managing your Housekeeping? Y  Comment home health    Patient Education/ Literacy How often do  you need to have someone help you when you read instructions, pamphlets, or other written materials from your doctor or pharmacy?: 4 - Often What is the last grade level you completed in school?: 12th grade  Exercise Current Exercise Habits: The patient does not participate in regular exercise at present;Home exercise routine, Type of exercise: walking, Time (Minutes): 30, Frequency (Times/Week): 7, Weekly Exercise (Minutes/Week): 210, Intensity: Mild  Diet Patient reports consuming 2 meals a day and 2 snack(s) a day Patient reports that his primary diet is: Regular Patient reports that she does have regular access to food.   Depression Screen    02/25/2022   11:22 AM 01/29/2022   10:08 AM 01/05/2022   10:44 AM 10/28/2021    8:19 AM 08/13/2021    2:19 PM 07/30/2021    2:35 PM 07/30/2021   10:14 AM  PHQ 2/9 Scores  PHQ - 2 Score 0 0 0 0 2 2 0  PHQ- 9 Score     9 8      Fall Risk    02/25/2022   11:22 AM 01/29/2022   10:08 AM 10/28/2021    8:19 AM 07/30/2021   10:14 AM 01/31/2021    9:48 AM  Fall Risk   Falls in the past year? 0 0 0 1 1  Number falls in past yr:    1 0  Injury with Fall?    0 0  Risk for fall due to :    History of fall(s);Impaired balance/gait History of fall(s)  Follow up    Falls evaluation completed Falls evaluation completed     Objective:  [name] seemed alert and oriented and he participated appropriately during our telephone visit.  Blood Pressure Weight BMI  BP Readings from Last 3 Encounters:  01/29/22 113/66  01/05/22 132/76  10/28/21 120/80   Wt Readings from Last 3 Encounters:  01/29/22 248 lb 3.2 oz (112.6 kg)  01/05/22 249 lb (112.9 kg)  10/28/21 245 lb (111.1 kg)   BMI Readings from Last 1 Encounters:  01/29/22 32.75 kg/m    *Unable to obtain current vital signs, weight, and BMI due to telephone visit type  Hearing/Vision  [name] did not seem to have difficulty with hearing/understanding during the telephone  conversation Reports that he has had a formal eye exam by an eye care professional within the past year Reports that he has not had a formal hearing evaluation within the past year *Unable to fully assess hearing and vision during telephone visit type  Cognitive Function:    02/25/2022   11:28 AM 08/23/2019   10:26 AM 08/22/2018   10:23 AM  6CIT Screen  What Year? 4 points 0 points 0 points  What month? 3 points 0 points 3 points  What time? 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   Immunization & Health Maintenance Record Immunization History  Administered Date(s) Administered   Influenza,inj,Quad PF,6+ Mos 02/12/2014, 02/26/2015, 05/01/2016, 02/09/2017, 02/07/2018, 01/24/2019, 02/06/2020, 01/13/2021,  01/29/2022   Moderna Sars-Covid-2 Vaccination 05/15/2020, 06/12/2020   Pneumococcal Conjugate-13 02/07/2018   Tdap 12/05/2011    Health Maintenance  Topic Date Due   Lung Cancer Screening  08/23/2014   COVID-19 Vaccine (3 - Moderna risk series) 07/10/2020   Zoster Vaccines- Shingrix (1 of 2) 05/01/2022 (Originally 01/22/1975)   TETANUS/TDAP  01/30/2023 (Originally 12/04/2021)   Medicare Annual Wellness (AWV)  02/26/2023   COLONOSCOPY (Pts 45-7yr Insurance coverage will need to be confirmed)  04/18/2025   INFLUENZA VACCINE  Completed   Hepatitis C Screening  Completed   HIV Screening  Completed   HPV VACCINES  Aged Out       Assessment  This is a routine wellness examination for [employer]  Health Maintenance: Due or Overdue Health Maintenance Due  Topic Date Due   Lung Cancer Screening  08/23/2014   COVID-19 Vaccine (3 -  Moderna risk series) 07/10/2020    [name] does not need a referral for Community Assistance: Care Management:   no Social Work:    no Prescription Assistance:  no Nutrition/Diabetes Education:  no   Plan:  Personalized Goals  Goals Addressed               This Visit's Progress     Patient Stated (pt-stated)        "I want to get around and "piddle" around in my house more"       Personalized Health Maintenance & Screening Recommendations  Advanced directives: has NO advanced directive - not interested in additional information  Lung Cancer Screening Recommended:Pt declines at this time  (Low Dose CT Chest recommended if Age 64-80years, 30 pack-year currently smoking OR have quit w/in past 15 years) Hepatitis C Screening recommended: pt declines HIV Screening recommended: pt declines  Advanced Directives: Written information was not prepared per patient's request.  Referrals & Orders No orders of the defined types were placed in this encounter.   Follow-up Plan Follow-up with SClaretta Fraise MD as planned Consider screening CT for Lung Cancer   I have personally reviewed and noted the following in the patient's chart:   Medical and social history Use of alcohol, tobacco or illicit drugs  Current medications and supplements Functional ability and status Nutritional status Physical activity Advanced directives List of other physicians Hospitalizations, surgeries, and ER visits in previous 12 months Vitals Screenings to include cognitive, depression, and falls Referrals and appointments  In addition, I have reviewed and discussed with [name] certain preventive protocols, quality metrics, and best practice recommendations. A written personalized care plan for preventive services as well as general preventive health recommendations is available and can be mailed to the patient at his request.      [name] 02/25/2022

## 2022-02-26 ENCOUNTER — Ambulatory Visit: Payer: Medicare Other

## 2022-03-02 ENCOUNTER — Other Ambulatory Visit: Payer: Self-pay | Admitting: Family Medicine

## 2022-03-02 ENCOUNTER — Ambulatory Visit: Payer: Medicare Other

## 2022-03-02 DIAGNOSIS — F3341 Major depressive disorder, recurrent, in partial remission: Secondary | ICD-10-CM

## 2022-03-02 DIAGNOSIS — F411 Generalized anxiety disorder: Secondary | ICD-10-CM

## 2022-03-03 ENCOUNTER — Ambulatory Visit (INDEPENDENT_AMBULATORY_CARE_PROVIDER_SITE_OTHER): Payer: Medicare Other | Admitting: *Deleted

## 2022-03-03 ENCOUNTER — Other Ambulatory Visit: Payer: Self-pay | Admitting: Family Medicine

## 2022-03-03 DIAGNOSIS — N401 Enlarged prostate with lower urinary tract symptoms: Secondary | ICD-10-CM

## 2022-03-03 DIAGNOSIS — E538 Deficiency of other specified B group vitamins: Secondary | ICD-10-CM | POA: Diagnosis not present

## 2022-03-19 ENCOUNTER — Other Ambulatory Visit: Payer: Self-pay | Admitting: Family Medicine

## 2022-03-19 DIAGNOSIS — E785 Hyperlipidemia, unspecified: Secondary | ICD-10-CM

## 2022-03-19 DIAGNOSIS — K219 Gastro-esophageal reflux disease without esophagitis: Secondary | ICD-10-CM

## 2022-03-19 DIAGNOSIS — I25118 Atherosclerotic heart disease of native coronary artery with other forms of angina pectoris: Secondary | ICD-10-CM

## 2022-04-02 ENCOUNTER — Ambulatory Visit (INDEPENDENT_AMBULATORY_CARE_PROVIDER_SITE_OTHER): Payer: Medicare Other | Admitting: *Deleted

## 2022-04-02 DIAGNOSIS — E538 Deficiency of other specified B group vitamins: Secondary | ICD-10-CM | POA: Diagnosis not present

## 2022-04-09 ENCOUNTER — Other Ambulatory Visit: Payer: Self-pay | Admitting: Family Medicine

## 2022-04-09 DIAGNOSIS — G4701 Insomnia due to medical condition: Secondary | ICD-10-CM

## 2022-04-09 DIAGNOSIS — F3341 Major depressive disorder, recurrent, in partial remission: Secondary | ICD-10-CM

## 2022-04-29 ENCOUNTER — Other Ambulatory Visit: Payer: Self-pay | Admitting: Family Medicine

## 2022-04-29 DIAGNOSIS — I25118 Atherosclerotic heart disease of native coronary artery with other forms of angina pectoris: Secondary | ICD-10-CM

## 2022-04-29 DIAGNOSIS — I209 Angina pectoris, unspecified: Secondary | ICD-10-CM

## 2022-05-04 ENCOUNTER — Ambulatory Visit (INDEPENDENT_AMBULATORY_CARE_PROVIDER_SITE_OTHER): Payer: 59 | Admitting: Nurse Practitioner

## 2022-05-04 ENCOUNTER — Ambulatory Visit (INDEPENDENT_AMBULATORY_CARE_PROVIDER_SITE_OTHER): Payer: 59

## 2022-05-04 ENCOUNTER — Encounter: Payer: Self-pay | Admitting: Nurse Practitioner

## 2022-05-04 VITALS — BP 120/67 | HR 83 | Temp 96.1°F | Resp 20 | Ht 73.0 in | Wt 245.0 lb

## 2022-05-04 DIAGNOSIS — E538 Deficiency of other specified B group vitamins: Secondary | ICD-10-CM | POA: Diagnosis not present

## 2022-05-04 DIAGNOSIS — L989 Disorder of the skin and subcutaneous tissue, unspecified: Secondary | ICD-10-CM

## 2022-05-04 MED ORDER — SULFAMETHOXAZOLE-TRIMETHOPRIM 800-160 MG PO TABS: 1.0000 | ORAL_TABLET | Freq: Two times a day (BID) | ORAL | 0 refills | Status: DC

## 2022-05-04 NOTE — Progress Notes (Signed)
   Subjective:    Patient ID: [id number] MAYEDA, male    DOB: 04/03/1956, 67 y.o.   MRN: [id number]   Chief Complaint: Sore area to face   HPI Sore place on right cheek. Started about 1 week ago. Has gotten some bigger and is sore tio the touch. No drainage.    Review of Systems  Skin:        Lesion on right cheek       Objective:   Physical Exam Vitals reviewed.  Constitutional:      Appearance: Normal appearance.  Cardiovascular:     Rate and Rhythm: Normal rate and regular rhythm.     Pulses: Normal pulses.     Heart sounds: Normal heart sounds.  Pulmonary:     Breath sounds: Normal breath sounds.  Skin:    General: Skin is warm.     Comments: 3cm annular lesion on right cheek. No drainage Surrounding erythema and tenderness  Neurological:     General: No focal deficit present.     Mental Status: He is alert and oriented to person, place, and time.  Psychiatric:        Mood and Affect: Mood normal.        Behavior: Behavior normal.      BP 120/67   Pulse 83   Temp (!) 96.1 F (35.6 C) (Temporal)   Resp 20   Ht '6\' 1"'$  (1.854 m)   Wt 245 lb (111.1 kg)   SpO2 93%   BMI 32.32 kg/m       Assessment & Plan:   Eann Cleland Delpilar in today with chief complaint of Sore area to face   1. Facial lesion Continue antibiotic ointment Do not pick or scratch at area. If not improving will send to dermatologist - sulfamethoxazole-trimethoprim (BACTRIM DS) 800-160 MG tablet; Take 1 tablet by mouth 2 (two) times daily.  Dispense: 20 tablet; Refill: 0    The above assessment and management plan was discussed with the patient. The patient verbalized understanding of and has agreed to the management plan. Patient is aware to call the clinic if symptoms persist or worsen. Patient is aware when to return to the clinic for a follow-up visit. Patient educated on when it is appropriate to go to the emergency department.   Mary-Margaret Hassell Done, FNP

## 2022-05-04 NOTE — Progress Notes (Signed)
Cyanocobalamin injection given to left deltoid.  Patient tolerated well. 

## 2022-06-04 ENCOUNTER — Ambulatory Visit: Payer: 59

## 2022-06-05 ENCOUNTER — Ambulatory Visit (INDEPENDENT_AMBULATORY_CARE_PROVIDER_SITE_OTHER): Payer: 59

## 2022-06-05 DIAGNOSIS — E538 Deficiency of other specified B group vitamins: Secondary | ICD-10-CM

## 2022-06-05 NOTE — Progress Notes (Signed)
Cyanocobalamin injection given to right deltoid.  Patient tolerated well. 

## 2022-06-08 DIAGNOSIS — R531 Weakness: Secondary | ICD-10-CM | POA: Diagnosis not present

## 2022-06-08 DIAGNOSIS — G809 Cerebral palsy, unspecified: Secondary | ICD-10-CM | POA: Diagnosis not present

## 2022-07-06 ENCOUNTER — Ambulatory Visit (INDEPENDENT_AMBULATORY_CARE_PROVIDER_SITE_OTHER): Payer: 59

## 2022-07-06 DIAGNOSIS — E538 Deficiency of other specified B group vitamins: Secondary | ICD-10-CM

## 2022-07-06 NOTE — Progress Notes (Signed)
Cyanococbalamin injection given to left deltoid.  Patient tolerated well.

## 2022-07-15 ENCOUNTER — Other Ambulatory Visit: Payer: Self-pay | Admitting: Family Medicine

## 2022-07-15 DIAGNOSIS — I209 Angina pectoris, unspecified: Secondary | ICD-10-CM

## 2022-07-15 DIAGNOSIS — I10 Essential (primary) hypertension: Secondary | ICD-10-CM

## 2022-07-15 DIAGNOSIS — I25118 Atherosclerotic heart disease of native coronary artery with other forms of angina pectoris: Secondary | ICD-10-CM

## 2022-07-30 ENCOUNTER — Ambulatory Visit: Payer: 59 | Admitting: Family Medicine

## 2022-07-31 ENCOUNTER — Encounter: Payer: Self-pay | Admitting: Family Medicine

## 2022-07-31 ENCOUNTER — Ambulatory Visit: Payer: 59

## 2022-08-03 ENCOUNTER — Ambulatory Visit (INDEPENDENT_AMBULATORY_CARE_PROVIDER_SITE_OTHER): Payer: 59

## 2022-08-03 DIAGNOSIS — E538 Deficiency of other specified B group vitamins: Secondary | ICD-10-CM | POA: Diagnosis not present

## 2022-08-03 NOTE — Progress Notes (Signed)
Cyanocobalamin injection given to right deltoid.  Patient tolerated well. 

## 2022-08-05 ENCOUNTER — Ambulatory Visit: Payer: 59

## 2022-08-19 ENCOUNTER — Other Ambulatory Visit: Payer: Self-pay | Admitting: Family Medicine

## 2022-08-19 DIAGNOSIS — N401 Enlarged prostate with lower urinary tract symptoms: Secondary | ICD-10-CM

## 2022-08-26 ENCOUNTER — Ambulatory Visit (INDEPENDENT_AMBULATORY_CARE_PROVIDER_SITE_OTHER): Payer: 59 | Admitting: Family Medicine

## 2022-08-26 VITALS — BP 119/61 | HR 71 | Temp 96.9°F | Ht 73.0 in | Wt 245.0 lb

## 2022-08-26 DIAGNOSIS — E538 Deficiency of other specified B group vitamins: Secondary | ICD-10-CM | POA: Diagnosis not present

## 2022-08-26 DIAGNOSIS — I10 Essential (primary) hypertension: Secondary | ICD-10-CM | POA: Diagnosis not present

## 2022-08-26 DIAGNOSIS — K219 Gastro-esophageal reflux disease without esophagitis: Secondary | ICD-10-CM | POA: Diagnosis not present

## 2022-08-26 DIAGNOSIS — I25118 Atherosclerotic heart disease of native coronary artery with other forms of angina pectoris: Secondary | ICD-10-CM | POA: Diagnosis not present

## 2022-08-26 DIAGNOSIS — J432 Centrilobular emphysema: Secondary | ICD-10-CM | POA: Diagnosis not present

## 2022-08-26 DIAGNOSIS — Z23 Encounter for immunization: Secondary | ICD-10-CM

## 2022-08-26 NOTE — Progress Notes (Signed)
Subjective:  Patient ID: [name], male    DOB: 1955/05/09  Age: 67 y.o. MRN: [id number]  CC: Medical Management of Chronic Issues   HPI [name] presents for  presents for  follow-up of hypertension. Patient has no history of headache chest pain or shortness of breath or recent cough. Patient also denies symptoms of TIA such as focal numbness or weakness. Patient denies side effects from medication. States taking it regularly.  Denies chest pain, palpitations. Working on prevention of cardiac or cerebrovascular event by reducing BBP, cholesterol. Taking ASA and antianginal Bblocker  Patient in for follow-up of GERD. Currently asymptomatic taking  PPI daily. There is no chest pain or heartburn. No hematemesis and no melena. No dysphagia or choking. Onset is remote. Progression is stable. Complicating factors, none.      08/26/2022   11:21 AM 08/26/2022   11:20 AM 05/04/2022    9:50 AM  Depression screen PHQ 2/9  Decreased Interest 0 0 0  Down, Depressed, Hopeless 0 0 0  PHQ - 2 Score 0 0 0  Altered sleeping   0  Tired, decreased energy   0  Change in appetite   0  Feeling bad or failure about yourself    0  Trouble concentrating   0  Moving slowly or fidgety/restless   0  Suicidal thoughts   0  PHQ-9 Score   0  Difficult doing work/chores   Not difficult at all    History [name] has a past medical history of Cerebral palsy (HCC), Coronary artery disease, Depression, GERD (gastroesophageal reflux disease), Hypertension, Scoliosis, and Seizures (HCC).   He has a past surgical history that includes Coronary angioplasty with stent; Carpal tunnel release (Right, 08/13/2016); and LEFT HEART CATH AND CORONARY ANGIOGRAPHY (N/A, 07/10/2017).   His family history includes Alcohol abuse in his brother; CAD in his brother and sister; CAD (age of onset: 26) in his father; Diabetes in his mother; Heart disease in his father; Heart failure in his sister.He reports that he has been  smoking cigarettes. He has a 20.50 pack-year smoking history. He has never used smokeless tobacco. He reports current alcohol use. He reports that he does not use drugs.    ROS Review of Systems  Constitutional:  Negative for fever.  Respiratory:  Negative for shortness of breath.   Cardiovascular:  Negative for chest pain.  Musculoskeletal:  Negative for arthralgias.  Skin:  Negative for rash.    Objective:  BP 119/61   Pulse 71   Temp (!) 96.9 F (36.1 C)   Ht 6\' 1"  (1.854 m)   Wt 245 lb (111.1 kg)   SpO2 93%   BMI 32.32 kg/m   BP Readings from Last 3 Encounters:  08/26/22 119/61  05/04/22 120/67  01/29/22 113/66    Wt Readings from Last 3 Encounters:  08/26/22 245 lb (111.1 kg)  05/04/22 245 lb (111.1 kg)  01/29/22 248 lb 3.2 oz (112.6 kg)     Physical Exam Vitals reviewed.  Constitutional:      Appearance: He is well-developed.  HENT:     Head: Normocephalic and atraumatic.     Right Ear: External ear normal.     Left Ear: External ear normal.     Mouth/Throat:     Pharynx: No oropharyngeal exudate or posterior oropharyngeal erythema.  Eyes:     Pupils: Pupils are equal, round, and reactive to light.  Cardiovascular:     Rate and Rhythm: Normal rate  and regular rhythm.     Heart sounds: No murmur heard. Pulmonary:     Effort: No respiratory distress.     Breath sounds: Normal breath sounds.  Musculoskeletal:     Cervical back: Normal range of motion and neck supple.  Neurological:     Mental Status: He is alert and oriented to person, place, and time.       Assessment & Plan:   [name] was seen today for medical management of chronic issues.  Diagnoses and all orders for this visit:  Centrilobular emphysema (HCC) -     CBC with Differential/Platelet -     CMP14+EGFR  B12 deficiency -     CBC with Differential/Platelet -     CMP14+EGFR -     Vitamin B12  Coronary artery disease of native artery of native heart with stable angina  pectoris (HCC) -     CBC with Differential/Platelet -     CMP14+EGFR -     Lipid panel  Essential hypertension -     CBC with Differential/Platelet -     CMP14+EGFR  Need for shingles vaccine -     Zoster Recombinant (Shingrix )  Gastroesophageal reflux disease, unspecified whether esophagitis present       I am having [name] maintain his cetaphil, aspirin EC, fexofenadine, fluocinonide-emollient, fluticasone, Trelegy Ellipta, albuterol, nitroGLYCERIN, acetaminophen, hydrocortisone cream, triamcinolone cream, DULoxetine, buPROPion, atorvastatin, pantoprazole, mirtazapine, sulfamethoxazole-trimethoprim, metoprolol tartrate, isosorbide mononitrate, and tamsulosin. We will continue to administer cyanocobalamin.  Allergies as of 08/26/2022       Reactions   Chantix [varenicline Tartrate] Nausea And Vomiting        Medication List        Accurate as of Aug 26, 2022 11:59 PM. If you have any questions, ask your nurse or doctor.          acetaminophen 500 MG tablet Commonly known as: TYLENOL Take 1 tablet (500 mg total) by mouth every 6 (six) hours as needed.   albuterol 108 (90 Base) MCG/ACT inhaler Commonly known as: VENTOLIN HFA Inhale 2 puffs into the lungs every 6 (six) hours as needed for wheezing or shortness of breath.   aspirin EC 81 MG tablet Take 1 tablet (81 mg total) by mouth daily.   atorvastatin 40 MG tablet Commonly known as: LIPITOR TAKE 1 TABLET ONCE DAILY AT 6PM   buPROPion 150 MG 12 hr tablet Commonly known as: WELLBUTRIN SR TAKE (1) TABLET DAILY WITH BREAKFAST.   cetaphil lotion Apply 1 application topically 2 (two) times daily. For rash. Apply after washing with [facility] shower gel and rinsing with clear warm water.   DULoxetine 60 MG capsule Commonly known as: CYMBALTA TAKE 1 CAPSULE ONCE DAILY WITH LARGEST MEAL   fexofenadine 180 MG tablet Commonly known as: ALLEGRA Take 1 tablet (180 mg total) by mouth daily. For allergy  symptoms   fluocinonide-emollient 0.05 % cream Commonly known as: LIDEX-E Apply 1 application topically 2 (two) times daily. To affected areas   fluticasone 50 MCG/ACT nasal spray Commonly known as: FLONASE Place 2 sprays into both nostrils daily as needed for allergies or rhinitis.   hydrocortisone cream 1 % Apply 1 Application topically 2 (two) times daily.   isosorbide mononitrate 60 MG 24 hr tablet Commonly known as: IMDUR TAKE 1 TABLET DAILY   metoprolol tartrate 25 MG tablet Commonly known as: LOPRESSOR TAKE (1) TABLET TWICE A DAY.   mirtazapine 15 MG tablet Commonly known as: REMERON TAKE 1 TABLET  AT BEDTIME AS NEEDED FOR SLEEP   nitroGLYCERIN 0.4 MG SL tablet Commonly known as: NITROSTAT Place 1 tablet (0.4 mg total) under the tongue every 5 (five) minutes x 3 doses as needed for chest pain.   pantoprazole 40 MG tablet Commonly known as: PROTONIX TAKE (1) TABLET TWICE A DAY.   sulfamethoxazole-trimethoprim 800-160 MG tablet Commonly known as: Bactrim DS Take 1 tablet by mouth 2 (two) times daily.   tamsulosin 0.4 MG Caps capsule Commonly known as: FLOMAX TAKE TWO CAPSULES DAILY AFTER SUPPER   Trelegy Ellipta 100-62.5-25 MCG/ACT Aepb Generic drug: Fluticasone-Umeclidin-Vilant Inhale 1 puff into the lungs daily.   triamcinolone cream 0.1 % Commonly known as: KENALOG Apply 1 Application topically 2 (two) times daily.         Follow-up: Return in about 6 months (around 02/26/2023).  Mechele Claude, M.D.

## 2022-08-27 LAB — CMP14+EGFR
ALT: 11 IU/L (ref 0–44)
AST: 13 IU/L (ref 0–40)
Albumin/Globulin Ratio: 1.3 (ref 1.2–2.2)
Albumin: 4 g/dL (ref 3.9–4.9)
Alkaline Phosphatase: 94 IU/L (ref 44–121)
BUN/Creatinine Ratio: 11 (ref 10–24)
BUN: 8 mg/dL (ref 8–27)
Bilirubin Total: 0.6 mg/dL (ref 0.0–1.2)
CO2: 27 mmol/L (ref 20–29)
Calcium: 9.3 mg/dL (ref 8.6–10.2)
Chloride: 96 mmol/L (ref 96–106)
Creatinine, Ser: 0.72 mg/dL — ABNORMAL LOW (ref 0.76–1.27)
Globulin, Total: 3 g/dL (ref 1.5–4.5)
Glucose: 90 mg/dL (ref 70–99)
Potassium: 3.8 mmol/L (ref 3.5–5.2)
Sodium: 140 mmol/L (ref 134–144)
Total Protein: 7 g/dL (ref 6.0–8.5)
eGFR: 102 mL/min/{1.73_m2} (ref >59)

## 2022-08-27 LAB — CBC WITH DIFFERENTIAL/PLATELET
Basophils Absolute: 0.1 10*3/uL (ref 0.0–0.2)
Basos: 1 %
EOS (ABSOLUTE): 1 10*3/uL — ABNORMAL HIGH (ref 0.0–0.4)
Eos: 10 %
Hematocrit: 41 % (ref 37.5–51.0)
Hemoglobin: 14.6 g/dL (ref 13.0–17.7)
Immature Grans (Abs): 0.1 10*3/uL (ref 0.0–0.1)
Immature Granulocytes: 1 %
Lymphocytes Absolute: 3.6 10*3/uL — ABNORMAL HIGH (ref 0.7–3.1)
Lymphs: 34 %
MCH: 34.8 pg — ABNORMAL HIGH (ref 26.6–33.0)
MCHC: 35.6 g/dL (ref 31.5–35.7)
MCV: 98 fL — ABNORMAL HIGH (ref 79–97)
Monocytes Absolute: 0.6 10*3/uL (ref 0.1–0.9)
Monocytes: 5 %
Neutrophils Absolute: 5.2 10*3/uL (ref 1.4–7.0)
Neutrophils: 49 %
Platelets: 233 10*3/uL (ref 150–450)
RBC: 4.2 x10E6/uL (ref 4.14–5.80)
RDW: 12.5 % (ref 11.6–15.4)
WBC: 10.5 10*3/uL (ref 3.4–10.8)

## 2022-08-27 LAB — LIPID PANEL
Chol/HDL Ratio: 3.7 ratio (ref 0.0–5.0)
Cholesterol, Total: 97 mg/dL — ABNORMAL LOW (ref 100–199)
HDL: 26 mg/dL — ABNORMAL LOW (ref >39)
LDL Chol Calc (NIH): 50 mg/dL (ref 0–99)
Triglycerides: 116 mg/dL (ref 0–149)
VLDL Cholesterol Cal: 21 mg/dL (ref 5–40)

## 2022-08-27 LAB — VITAMIN B12: Vitamin B-12: 654 pg/mL (ref 232–1245)

## 2022-08-28 ENCOUNTER — Encounter: Payer: Self-pay | Admitting: Family Medicine

## 2022-08-28 DIAGNOSIS — K219 Gastro-esophageal reflux disease without esophagitis: Secondary | ICD-10-CM | POA: Insufficient documentation

## 2022-08-30 NOTE — Progress Notes (Signed)
Hello [name],  Your lab result is normal and/or stable.Some minor variations that are not significant are commonly marked abnormal, but do not represent any medical problem for you.  Best regards, Wendell Nicoson, M.D.

## 2022-09-02 ENCOUNTER — Other Ambulatory Visit: Payer: Self-pay | Admitting: Family Medicine

## 2022-09-02 DIAGNOSIS — K219 Gastro-esophageal reflux disease without esophagitis: Secondary | ICD-10-CM

## 2022-09-03 ENCOUNTER — Ambulatory Visit: Payer: 59

## 2022-09-08 ENCOUNTER — Other Ambulatory Visit: Payer: Self-pay | Admitting: Family Medicine

## 2022-09-08 DIAGNOSIS — I25118 Atherosclerotic heart disease of native coronary artery with other forms of angina pectoris: Secondary | ICD-10-CM

## 2022-09-08 DIAGNOSIS — E785 Hyperlipidemia, unspecified: Secondary | ICD-10-CM

## 2022-09-28 IMAGING — DX DG RIBS W/ CHEST 3+V*R*
5 series · 5 of 5 positions shown · non-contrast
Comparison: 06/27/2018

CLINICAL DATA: Right flank pain

EXAM:
RIGHT RIBS AND CHEST - 3+ VIEW

[chest pa]
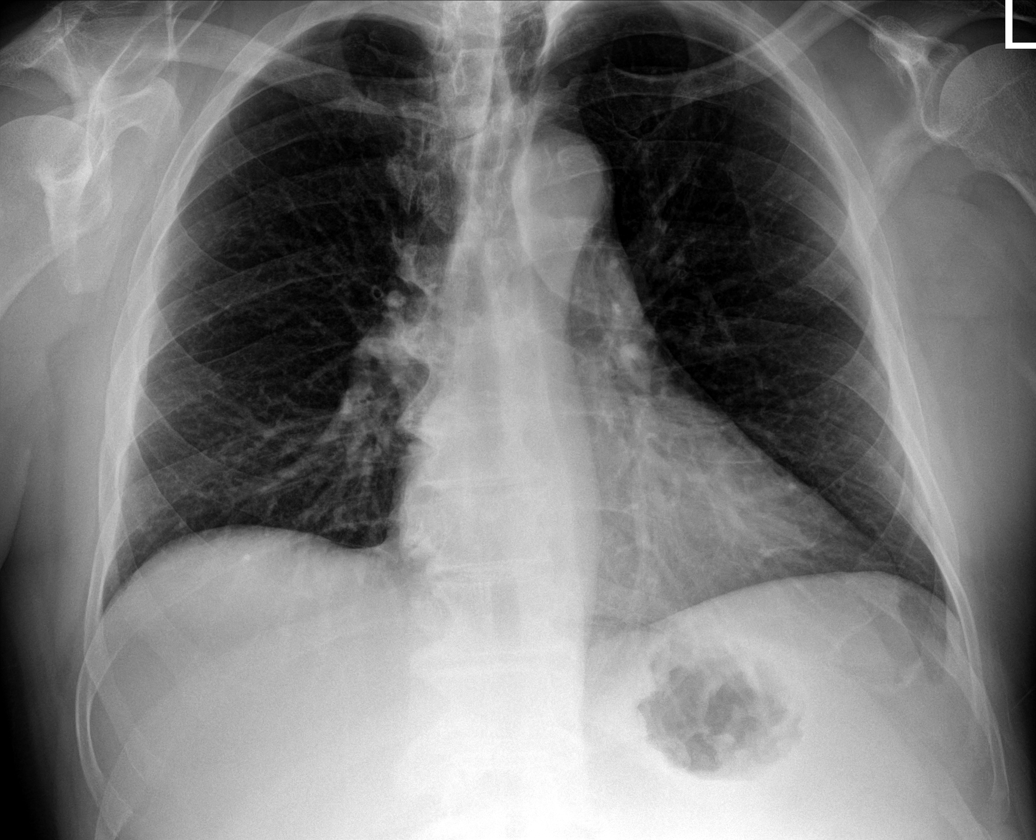

[rib obl (1 of 2)]
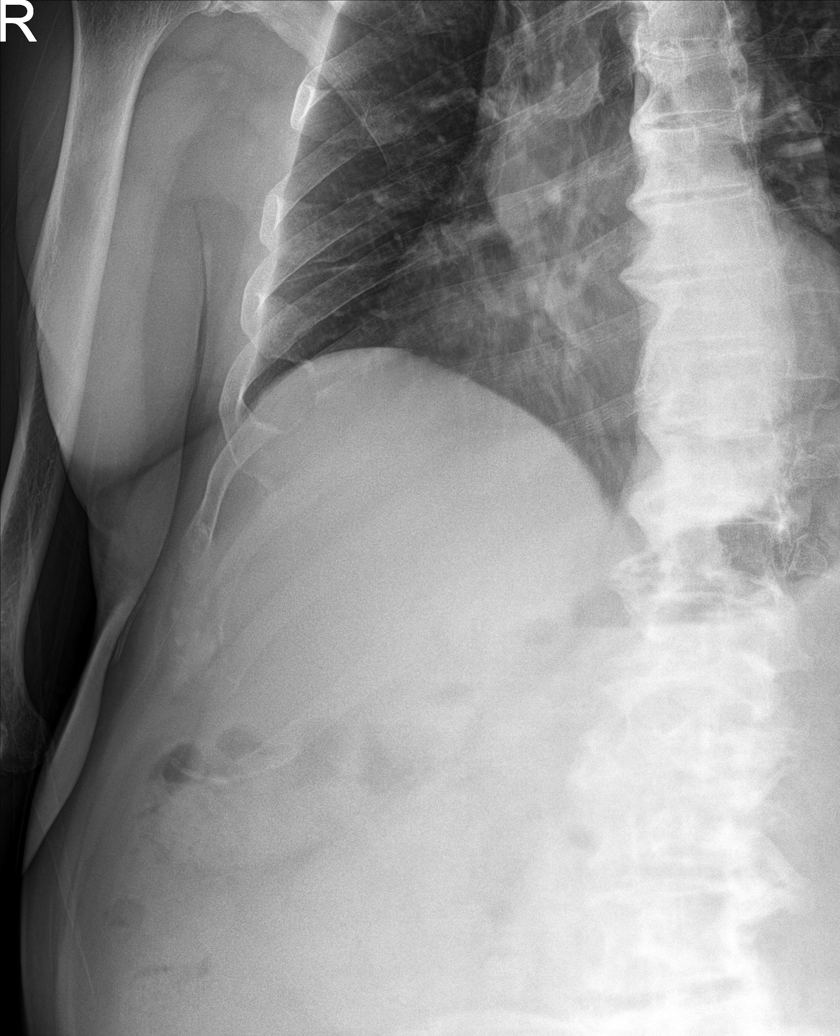

[rib obl (2 of 2)]
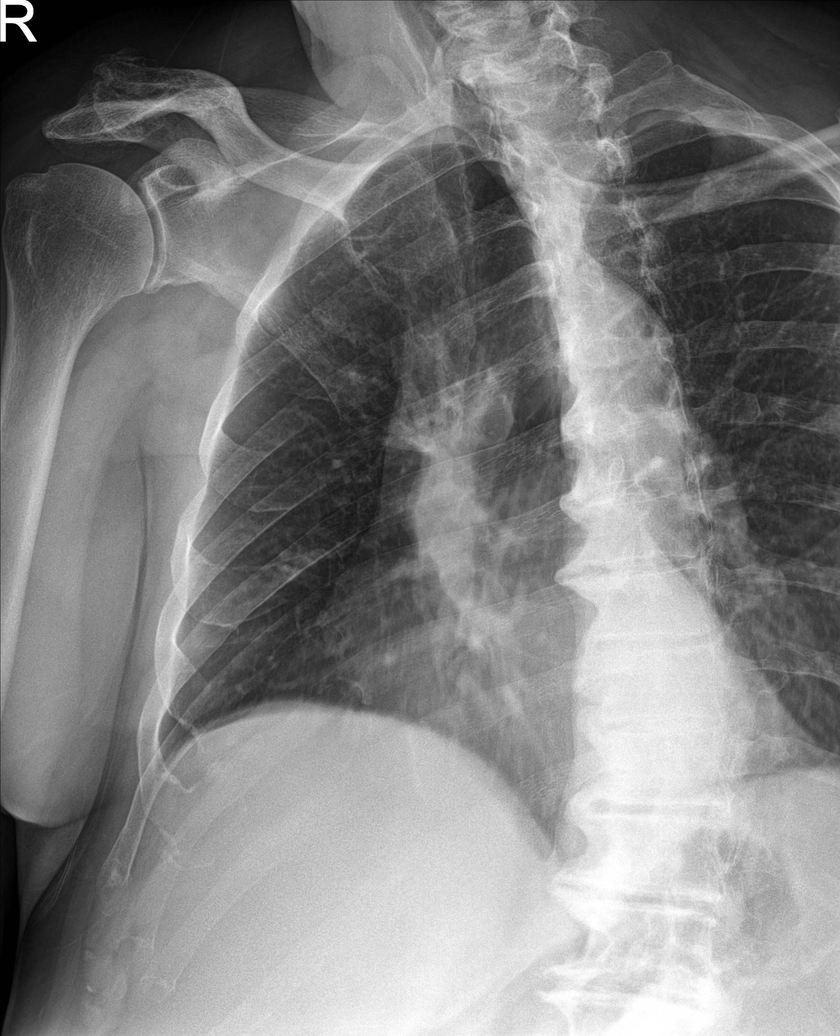

[rib ap (1 of 2)]
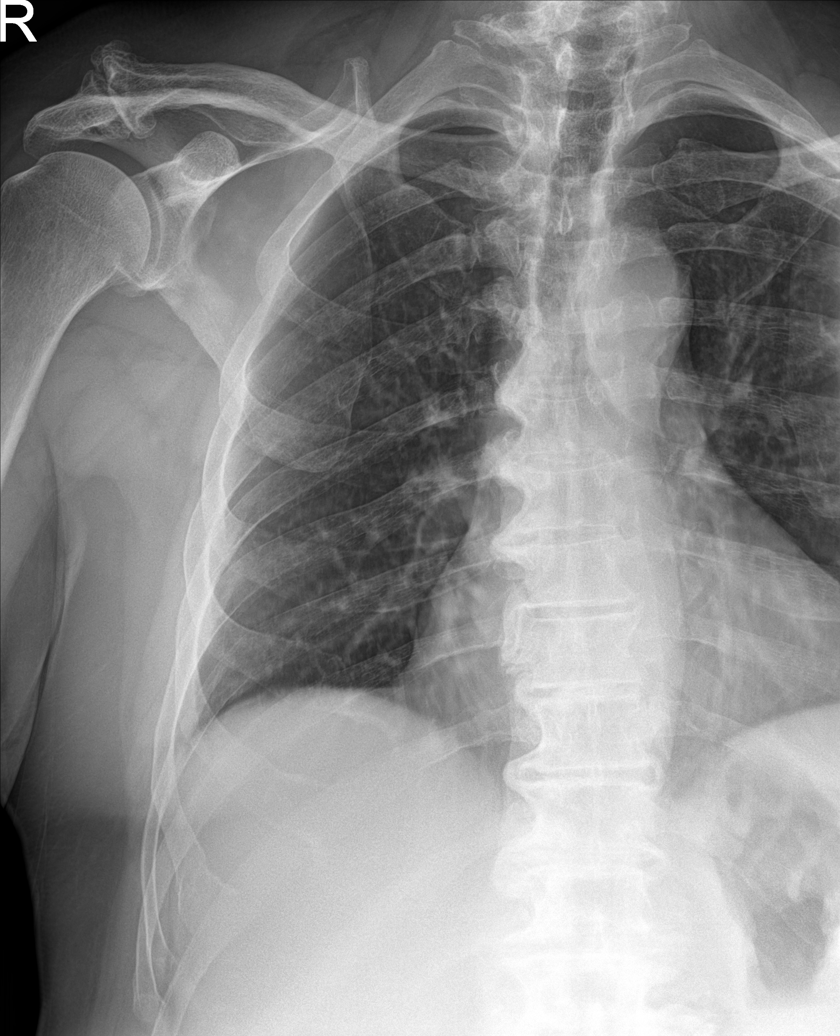

[rib ap (2 of 2)]
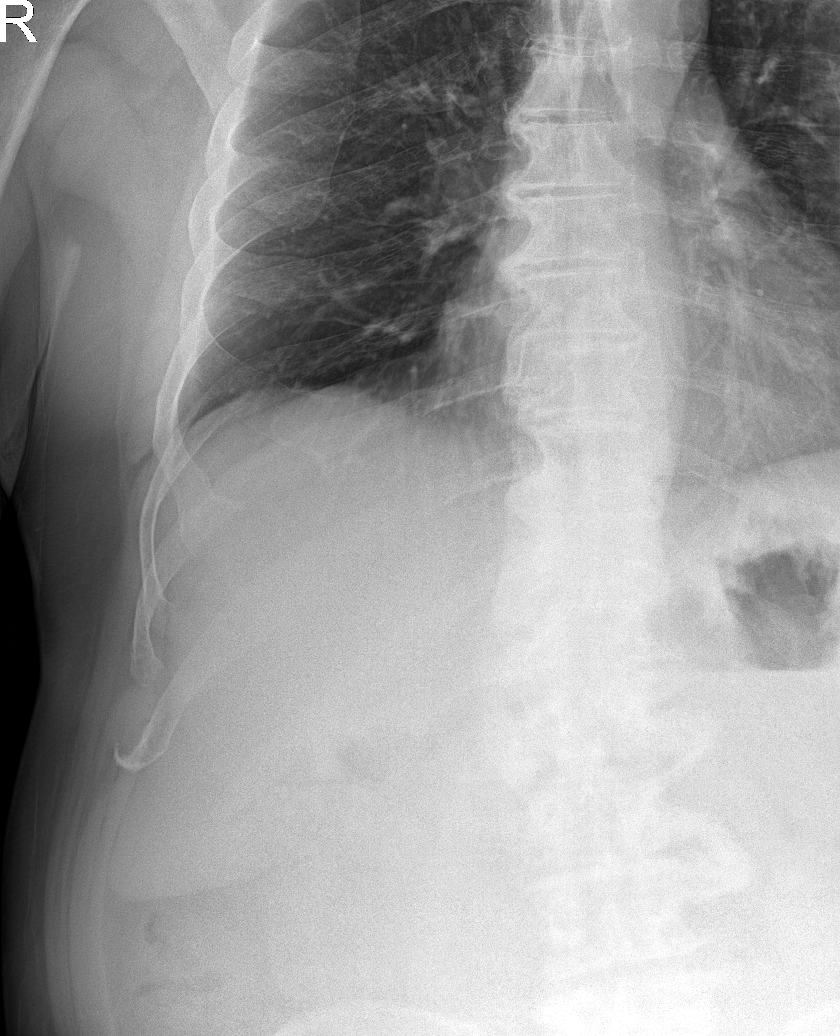

[5 of 5 positions shown; findings below may reference images not displayed]

FINDINGS: No fracture or other bone lesions are seen involving the ribs. There
is no evidence of pneumothorax or pleural effusion. Both lungs are
clear. Heart size and mediastinal contours are within normal limits.
IMPRESSION: Negative.

## 2022-10-06 ENCOUNTER — Ambulatory Visit (INDEPENDENT_AMBULATORY_CARE_PROVIDER_SITE_OTHER): Payer: 59

## 2022-10-06 DIAGNOSIS — E538 Deficiency of other specified B group vitamins: Secondary | ICD-10-CM

## 2022-10-06 NOTE — Progress Notes (Signed)
Cyanocobalamin injection given to left deltoid.  Patient tolerated well. 

## 2022-10-08 ENCOUNTER — Other Ambulatory Visit: Payer: Self-pay | Admitting: Family Medicine

## 2022-10-08 DIAGNOSIS — F3341 Major depressive disorder, recurrent, in partial remission: Secondary | ICD-10-CM

## 2022-10-08 DIAGNOSIS — G4701 Insomnia due to medical condition: Secondary | ICD-10-CM

## 2022-10-12 ENCOUNTER — Ambulatory Visit: Payer: 59 | Admitting: Family Medicine

## 2022-10-12 ENCOUNTER — Encounter: Payer: Self-pay | Admitting: Family Medicine

## 2022-10-21 ENCOUNTER — Other Ambulatory Visit: Payer: Self-pay | Admitting: Family Medicine

## 2022-10-21 DIAGNOSIS — I209 Angina pectoris, unspecified: Secondary | ICD-10-CM

## 2022-10-21 DIAGNOSIS — I25118 Atherosclerotic heart disease of native coronary artery with other forms of angina pectoris: Secondary | ICD-10-CM

## 2022-10-28 ENCOUNTER — Other Ambulatory Visit: Payer: Self-pay | Admitting: Family Medicine

## 2022-10-28 DIAGNOSIS — I209 Angina pectoris, unspecified: Secondary | ICD-10-CM

## 2022-10-28 DIAGNOSIS — I10 Essential (primary) hypertension: Secondary | ICD-10-CM

## 2022-10-28 DIAGNOSIS — I25118 Atherosclerotic heart disease of native coronary artery with other forms of angina pectoris: Secondary | ICD-10-CM

## 2022-11-09 ENCOUNTER — Ambulatory Visit (INDEPENDENT_AMBULATORY_CARE_PROVIDER_SITE_OTHER): Payer: 59 | Admitting: Nurse Practitioner

## 2022-11-09 ENCOUNTER — Ambulatory Visit: Payer: 59

## 2022-11-09 ENCOUNTER — Encounter: Payer: Self-pay | Admitting: Nurse Practitioner

## 2022-11-09 ENCOUNTER — Ambulatory Visit (INDEPENDENT_AMBULATORY_CARE_PROVIDER_SITE_OTHER): Payer: 59

## 2022-11-09 VITALS — BP 123/70 | HR 80 | Temp 97.9°F | Ht 73.0 in | Wt 243.6 lb

## 2022-11-09 DIAGNOSIS — M25531 Pain in right wrist: Secondary | ICD-10-CM | POA: Diagnosis not present

## 2022-11-09 DIAGNOSIS — M19031 Primary osteoarthritis, right wrist: Secondary | ICD-10-CM | POA: Diagnosis not present

## 2022-11-09 DIAGNOSIS — M7989 Other specified soft tissue disorders: Secondary | ICD-10-CM | POA: Diagnosis not present

## 2022-11-09 DIAGNOSIS — E538 Deficiency of other specified B group vitamins: Secondary | ICD-10-CM | POA: Diagnosis not present
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
## 2022-11-26 ENCOUNTER — Other Ambulatory Visit: Payer: Self-pay | Admitting: Family Medicine

## 2022-11-26 DIAGNOSIS — F3341 Major depressive disorder, recurrent, in partial remission: Secondary | ICD-10-CM

## 2022-11-26 DIAGNOSIS — N401 Enlarged prostate with lower urinary tract symptoms: Secondary | ICD-10-CM

## 2022-11-26 DIAGNOSIS — F411 Generalized anxiety disorder: Secondary | ICD-10-CM

## 2022-12-08 ENCOUNTER — Encounter: Payer: Self-pay | Admitting: Family Medicine

## 2022-12-08 ENCOUNTER — Ambulatory Visit (INDEPENDENT_AMBULATORY_CARE_PROVIDER_SITE_OTHER): Payer: 59 | Admitting: Family Medicine

## 2022-12-08 VITALS — BP 123/76 | HR 71 | Temp 97.5°F | Ht 73.0 in | Wt 240.6 lb

## 2022-12-08 DIAGNOSIS — M25531 Pain in right wrist: Secondary | ICD-10-CM | POA: Diagnosis not present

## 2022-12-08 MED ORDER — PREDNISONE 20 MG PO TABS: ORAL_TABLET | ORAL | 0 refills | Status: DC
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
## 2022-12-09 LAB — BMP8+EGFR
BUN/Creatinine Ratio: 11 (ref 10–24)
BUN: 9 mg/dL (ref 8–27)
CO2: 29 mmol/L (ref 20–29)
Calcium: 9.2 mg/dL (ref 8.6–10.2)
Chloride: 95 mmol/L — ABNORMAL LOW (ref 96–106)
Creatinine, Ser: 0.84 mg/dL (ref 0.76–1.27)
Glucose: 89 mg/dL (ref 70–99)
Potassium: 3.6 mmol/L (ref 3.5–5.2)
Sodium: 140 mmol/L (ref 134–144)
eGFR: 97 mL/min/{1.73_m2} (ref >59)

## 2022-12-09 LAB — URIC ACID: Uric Acid: 6.2 mg/dL (ref 3.8–8.4)

## 2022-12-09 NOTE — Progress Notes (Signed)
Hello Amadou,  Your lab result is normal and/or stable.Some minor variations that are not significant are commonly marked abnormal, but do not represent any medical problem for you.  Best regards, Warren [name], M.D.

## 2022-12-11 ENCOUNTER — Ambulatory Visit: Payer: 59

## 2022-12-16 ENCOUNTER — Ambulatory Visit (INDEPENDENT_AMBULATORY_CARE_PROVIDER_SITE_OTHER): Payer: 59

## 2022-12-16 DIAGNOSIS — E538 Deficiency of other specified B group vitamins: Secondary | ICD-10-CM

## 2022-12-16 NOTE — Progress Notes (Signed)
B-12 injection given to left deltoid. Tolerated well.

## 2023-01-11 DIAGNOSIS — M25531 Pain in right wrist: Secondary | ICD-10-CM | POA: Diagnosis not present

## 2023-01-18 ENCOUNTER — Ambulatory Visit (INDEPENDENT_AMBULATORY_CARE_PROVIDER_SITE_OTHER): Payer: 59 | Admitting: *Deleted

## 2023-01-18 DIAGNOSIS — Z23 Encounter for immunization: Secondary | ICD-10-CM

## 2023-01-18 DIAGNOSIS — E538 Deficiency of other specified B group vitamins: Secondary | ICD-10-CM

## 2023-01-18 NOTE — Progress Notes (Signed)
B12 given right deltoid, Influenza also given left deltoid. Pt tolerated well.

## 2023-01-27 ENCOUNTER — Encounter: Payer: Self-pay | Admitting: Family Medicine

## 2023-01-27 ENCOUNTER — Ambulatory Visit (INDEPENDENT_AMBULATORY_CARE_PROVIDER_SITE_OTHER): Payer: 59

## 2023-01-27 VITALS — BP 123/73 | HR 84 | Temp 97.3°F | Ht 73.0 in | Wt 241.0 lb

## 2023-01-27 DIAGNOSIS — R3129 Other microscopic hematuria: Secondary | ICD-10-CM | POA: Diagnosis not present

## 2023-01-27 DIAGNOSIS — N39498 Other specified urinary incontinence: Secondary | ICD-10-CM | POA: Diagnosis not present

## 2023-01-27 LAB — MICROSCOPIC EXAMINATION
Bacteria, UA: NONE SEEN
Renal Epithel, UA: NONE SEEN /[HPF]
Yeast, UA: NONE SEEN

## 2023-01-27 LAB — URINALYSIS, ROUTINE W REFLEX MICROSCOPIC
Bilirubin, UA: NEGATIVE
Glucose, UA: NEGATIVE
Leukocytes,UA: NEGATIVE
Nitrite, UA: NEGATIVE
Specific Gravity, UA: 1.025 (ref 1.005–1.030)
Urobilinogen, Ur: >8 mg/dL — ABNORMAL HIGH (ref 0.2–1.0)
pH, UA: 6 (ref 5.0–7.5)

## 2023-01-27 MED ORDER — CIPROFLOXACIN HCL 500 MG PO TABS
500.0000 mg | ORAL_TABLET | Freq: Two times a day (BID) | ORAL | 0 refills | Status: AC
Start: 2023-01-27 — End: 2023-02-03

## 2023-01-27 NOTE — Progress Notes (Signed)
Subjective:  Patient ID: [name], male    DOB: 04-22-1955, 67 y.o.   MRN: [id number]  Patient Care Team: Mechele Claude, MD as PCP - General (Family Medicine) Rollene Rotunda, MD as PCP - Cardiology (Cardiology)   Chief Complaint:  Urinary Incontinence (X 1 week )   HPI: [name] is a 67 y.o. male presenting on 01/27/2023 for Urinary Incontinence (X 1 week )   Pt presents today with complaint of urinary incontinence. States this has been intermittent for several days. He denies any other associated symptoms. States he was told he needed to be evaluated.      Relevant past medical, surgical, family, and social history reviewed and updated as indicated.  Allergies and medications reviewed and updated. Data reviewed: Chart in Epic.   Past Medical History:  Diagnosis Date   Cerebral palsy (HCC)    Coronary artery disease    LAD 70% stenosis, cath 2019   Depression    GERD (gastroesophageal reflux disease)    Hypertension    Scoliosis    Seizures (HCC)    pt's sister states that patinet has had seizures in the past, pt camr for PAT by himself on last visit and she stst "they misunderstood what he was trying to say. Pt saw neurologist in March who did CT and states patient does not have seizures. On no meds, had seizures as child.    Past Surgical History:  Procedure Laterality Date   CARPAL TUNNEL RELEASE Right 08/13/2016   Procedure: CARPAL TUNNEL RELEASE;  Surgeon: Vickki Hearing, MD;  Location: [facility];  Service: Orthopedics;  Laterality: Right;   CORONARY ANGIOPLASTY WITH STENT PLACEMENT     LEFT HEART CATH AND CORONARY ANGIOGRAPHY N/A 07/10/2017   Procedure: LEFT HEART CATH AND CORONARY ANGIOGRAPHY;  Surgeon: Lennette Bihari, MD;  Location: [facility];  Service: Cardiovascular;  Laterality: N/A;    Social History   Socioeconomic History   Marital status: Single    Spouse name: Not on file   Number of children: 0   Years of education: 12    Highest education level: High school graduate  Occupational History   Occupation: disabled  Tobacco Use   Smoking status: Every Day    Current packs/day: 0.00    Average packs/day: 0.5 packs/day for 41.0 years (20.5 ttl pk-yrs)    Types: Cigarettes    Start date: 06/06/1977    Last attempt to quit: 06/06/2018    Years since quitting: 4.6   Smokeless tobacco: Never  Vaping Use   Vaping status: Never Used  Substance and Sexual Activity   Alcohol use: Yes    Comment: "once in a blue moon" typically drinks beer 2 weeks or longer   Drug use: No   Sexual activity: Never    Birth control/protection: None  Other Topics Concern   Not on file  Social History Narrative   08/05/2018   Patient lives alone in a trailer. His sister Alona Bene will take take him to the grocery store or bring things by for him. He usually sees her twice a week. His "baby sister" also checks in on him. He is able to perform most ADLs but is illiterate. He does not drive and depends on family or arranged transportation - usually [name]. States that his landlord are nice and lookout for him. He has an aide that comes 5 days per week who helps him bathe, shop, pay bills, etc.   Social  Determinants of Health   Financial Resource Strain: Low Risk  (02/25/2022)   Overall Financial Resource Strain (CARDIA)    Difficulty of Paying Living Expenses: Not hard at all  Food Insecurity: No Food Insecurity (02/25/2022)   Hunger Vital Sign    Worried About Running Out of Food in the Last Year: Never true    Ran Out of Food in the Last Year: Never true  Transportation Needs: No Transportation Needs (02/25/2022)   PRAPARE - Administrator, Civil Service (Medical): No    Lack of Transportation (Non-Medical): No  Physical Activity: Sufficiently Active (02/25/2022)   Exercise Vital Sign    Days of Exercise per Week: 7 days    Minutes of Exercise per Session: 30 min  Stress: No Stress Concern Present (02/25/2022)   [employer] of Occupational Health - Occupational Stress Questionnaire    Feeling of Stress : Not at all  Social Connections: Moderately Integrated (02/25/2022)   Social Connection and Isolation Panel [NHANES]    Frequency of Communication with Friends and Family: Three times a week    Frequency of Social Gatherings with Friends and Family: Once a week    Attends Religious Services: More than 4 times per year    Active Member of [employer] or Organizations: Yes    Attends Engineer, structural: More than 4 times per year    Marital Status: Never married  Intimate Partner Violence: Not At Risk (02/25/2022)   Humiliation, Afraid, Rape, and Kick questionnaire    Fear of Current or Ex-Partner: No    Emotionally Abused: No    Physically Abused: No    Sexually Abused: No    Outpatient Encounter Medications as of 01/27/2023  Medication Sig   acetaminophen (TYLENOL) 500 MG tablet Take 1 tablet (500 mg total) by mouth every 6 (six) hours as needed.   albuterol (VENTOLIN HFA) 108 (90 Base) MCG/ACT inhaler Inhale 2 puffs into the lungs every 6 (six) hours as needed for wheezing or shortness of breath.   aspirin EC 81 MG tablet Take 1 tablet (81 mg total) by mouth daily.   atorvastatin (LIPITOR) 40 MG tablet TAKE 1 TABLET ONCE DAILY AT 6PM   buPROPion (WELLBUTRIN SR) 150 MG 12 hr tablet TAKE ONE TABLET DAILY WITH BREAKFAST   cetaphil (CETAPHIL) lotion Apply 1 application topically 2 (two) times daily. For rash. Apply after washing with [facility] shower gel and rinsing with clear warm water.   ciprofloxacin (CIPRO) 500 MG tablet Take 1 tablet (500 mg total) by mouth 2 (two) times daily for 7 days.   DULoxetine (CYMBALTA) 60 MG capsule TAKE 1 CAPSULE ONCE DAILY WITH LARGEST MEAL   fexofenadine (ALLEGRA) 180 MG tablet Take 1 tablet (180 mg total) by mouth daily. For allergy symptoms   fluocinonide-emollient (LIDEX-E) 0.05 % cream Apply 1 application topically 2 (two) times daily. To affected areas    fluticasone (FLONASE) 50 MCG/ACT nasal spray Place 2 sprays into both nostrils daily as needed for allergies or rhinitis.   Fluticasone-Umeclidin-Vilant (TRELEGY ELLIPTA) 100-62.5-25 MCG/ACT AEPB Inhale 1 puff into the lungs daily.   hydrocortisone cream 1 % Apply 1 Application topically 2 (two) times daily.   isosorbide mononitrate (IMDUR) 60 MG 24 hr tablet TAKE ONE TABLET DAILY   metoprolol tartrate (LOPRESSOR) 25 MG tablet TAKE ONE TABLET TWICE DAILY   mirtazapine (REMERON) 15 MG tablet TAKE 1 TABLET AT BEDTIME AS NEEDED FOR SLEEP   nitroGLYCERIN (NITROSTAT) 0.4 MG SL tablet Place  1 tablet (0.4 mg total) under the tongue every 5 (five) minutes x 3 doses as needed for chest pain.   pantoprazole (PROTONIX) 40 MG tablet TAKE ONE TABLET TWICE DAILY   tamsulosin (FLOMAX) 0.4 MG CAPS capsule TAKE TWO CAPSULES DAILY AFTER SUPPER   triamcinolone cream (KENALOG) 0.1 % Apply 1 Application topically 2 (two) times daily.   [DISCONTINUED] sulfamethoxazole-trimethoprim (BACTRIM DS) 800-160 MG tablet Take 1 tablet by mouth 2 (two) times daily.   [DISCONTINUED] predniSONE (DELTASONE) 20 MG tablet One twice daily with food   Facility-Administered Encounter Medications as of 01/27/2023  Medication   cyanocobalamin ((VITAMIN B-12)) injection 1,000 mcg    Allergies  Allergen Reactions   Chantix [Varenicline Tartrate] Nausea And Vomiting    Review of Systems  Constitutional:  Negative for activity change, appetite change, chills, diaphoresis, fatigue, fever and unexpected weight change.  HENT: Negative.    Eyes: Negative.   Respiratory:  Negative for cough, chest tightness and shortness of breath.   Cardiovascular:  Negative for chest pain, palpitations and leg swelling.  Gastrointestinal:  Negative for abdominal distention, abdominal pain, anal bleeding, blood in stool, constipation, diarrhea, nausea, rectal pain and vomiting.  Endocrine: Negative.   Genitourinary:  Negative for decreased urine volume,  difficulty urinating, dysuria, flank pain, frequency, genital sores, hematuria, penile discharge, penile pain, penile swelling, scrotal swelling, testicular pain and urgency.       Urinary incontinence   Musculoskeletal:  Negative for arthralgias and myalgias.  Skin: Negative.   Allergic/Immunologic: Negative.   Neurological:  Negative for dizziness, tremors, seizures, syncope, facial asymmetry, speech difficulty, weakness, light-headedness, numbness and headaches.  Hematological: Negative.   Psychiatric/Behavioral:  Negative for confusion, hallucinations, sleep disturbance and suicidal ideas.   All other systems reviewed and are negative.       Objective:  BP 123/73   Pulse 84   Temp (!) 97.3 F (36.3 C) (Temporal)   Ht 6\' 1"  (1.854 m)   Wt 241 lb (109.3 kg)   BMI 31.80 kg/m    Wt Readings from Last 3 Encounters:  01/27/23 241 lb (109.3 kg)  12/08/22 240 lb 9.6 oz (109.1 kg)  11/09/22 243 lb 9.6 oz (110.5 kg)    Physical Exam Vitals and nursing note reviewed.  Constitutional:      General: He is not in acute distress.    Appearance: He is obese. He is not ill-appearing, toxic-appearing or diaphoretic.  HENT:     Head: Normocephalic and atraumatic.     Nose: Nose normal.     Mouth/Throat:     Mouth: Mucous membranes are moist.  Eyes:     Pupils: Pupils are equal, round, and reactive to light.  Cardiovascular:     Rate and Rhythm: Normal rate and regular rhythm.     Heart sounds: Normal heart sounds.  Pulmonary:     Effort: Pulmonary effort is normal.     Breath sounds: Normal breath sounds.  Abdominal:     General: Bowel sounds are normal. There is no distension.     Palpations: Abdomen is soft.     Tenderness: There is no abdominal tenderness. There is no right CVA tenderness or left CVA tenderness.  Musculoskeletal:     Cervical back: Neck supple.  Skin:    General: Skin is warm and dry.     Capillary Refill: Capillary refill takes less than 2 seconds.   Neurological:     General: No focal deficit present.     Mental Status: He  is alert and oriented to person, place, and time.  Psychiatric:        Attention and Perception: Attention normal.        Mood and Affect: Mood normal.        Behavior: Behavior normal.     Results for orders placed or performed in visit on 12/08/22  BMP8+EGFR  Result Value Ref Range   Glucose 89 70 - 99 mg/dL   BUN 9 8 - 27 mg/dL   Creatinine, Ser 7.82 0.76 - 1.27 mg/dL   eGFR 97 >95 AO/ZHY/8.65   BUN/Creatinine Ratio 11 10 - 24   Sodium 140 134 - 144 mmol/L   Potassium 3.6 3.5 - 5.2 mmol/L   Chloride 95 (L) 96 - 106 mmol/L   CO2 29 20 - 29 mmol/L   Calcium 9.2 8.6 - 10.2 mg/dL  Uric acid  Result Value Ref Range   Uric Acid 6.2 3.8 - 8.4 mg/dL       Pertinent labs & imaging results that were available during my care of the patient were reviewed by me and considered in my medical decision making.  Assessment & Plan:  Rieley was seen today for urinary incontinence.  Diagnoses and all orders for this visit:  Other urinary incontinence Other microscopic hematuria Culture pending. Will start Cipro to cover UTI and possible prostatitis. Pt to follow up in 2 weeks for reevaluation, sooner if warranted. Report new, worsening, or persistent symptoms. Will refer to urology if warranted.  -     Urinalysis, Routine w reflex microscopic -     Urine Culture -     ciprofloxacin (CIPRO) 500 MG tablet; Take 1 tablet (500 mg total) by mouth 2 (two) times daily for 7 days.   Continue all other maintenance medications.  Follow up plan: Return in 2 weeks (on 02/10/2023), or if symptoms worsen or fail to improve, for urine recheck.   Continue healthy lifestyle choices, including diet (rich in fruits, vegetables, and lean proteins, and low in salt and simple carbohydrates) and exercise (at least 30 minutes of moderate physical activity daily).    The above assessment and management plan was discussed with  the patient. The patient verbalized understanding of and has agreed to the management plan. Patient is aware to call the clinic if they develop any new symptoms or if symptoms persist or worsen. Patient is aware when to return to the clinic for a follow-up visit. Patient educated on when it is appropriate to go to the emergency department.   Kari Baars, FNP-C [facility] [phone]

## 2023-01-29 LAB — URINE CULTURE

## 2023-02-10 ENCOUNTER — Ambulatory Visit: Payer: 59 | Admitting: Family Medicine

## 2023-02-17 ENCOUNTER — Encounter: Payer: Self-pay | Admitting: Family Medicine

## 2023-02-17 ENCOUNTER — Ambulatory Visit: Payer: 59

## 2023-02-17 ENCOUNTER — Ambulatory Visit: Payer: 59 | Admitting: Family Medicine

## 2023-02-17 VITALS — BP 133/68 | HR 80 | Temp 96.6°F | Ht 73.0 in | Wt 245.0 lb

## 2023-02-17 DIAGNOSIS — R202 Paresthesia of skin: Secondary | ICD-10-CM

## 2023-02-17 DIAGNOSIS — E538 Deficiency of other specified B group vitamins: Secondary | ICD-10-CM | POA: Diagnosis not present

## 2023-02-17 DIAGNOSIS — N39498 Other specified urinary incontinence: Secondary | ICD-10-CM | POA: Diagnosis not present

## 2023-02-17 DIAGNOSIS — R6889 Other general symptoms and signs: Secondary | ICD-10-CM | POA: Diagnosis not present

## 2023-02-17 LAB — URINALYSIS, ROUTINE W REFLEX MICROSCOPIC
Bilirubin, UA: NEGATIVE
Glucose, UA: NEGATIVE
Leukocytes,UA: NEGATIVE
Nitrite, UA: NEGATIVE
Specific Gravity, UA: >1.03 — ABNORMAL HIGH (ref 1.005–1.030)
Urobilinogen, Ur: >8 mg/dL — ABNORMAL HIGH (ref 0.2–1.0)
pH, UA: 5.5 (ref 5.0–7.5)

## 2023-02-17 LAB — MICROSCOPIC EXAMINATION
Renal Epithel, UA: NONE SEEN /[HPF]
Yeast, UA: NONE SEEN

## 2023-02-17 NOTE — Progress Notes (Addendum)
 Subjective:  Patient ID: [name], male    DOB: [birth year], 67 y.o.   MRN: [id number]  Patient Care Team: Zollie Lowers, MD as PCP - General (Family Medicine) Lavona Agent, MD as PCP - Cardiology (Cardiology)   Chief Complaint:  Urinary Incontinence (2 week re check )   HPI: [name] is a 67 y.o. male presenting on 02/17/2023 for Urinary Incontinence (2 week re check )   Discussed the use of AI scribe software for clinical note transcription with the patient, who gave verbal consent to proceed.  History of Present Illness   The patient, with a history of prostatitis, reports persistent urinary incontinence despite recent antibiotic treatment. The symptoms are more pronounced at night but can occur during the day, particularly before using the bathroom. The patient denies any associated pain or discomfort. He has been using incontinence supplies at home due to the continued incontinence.   In addition to the urinary issues, the patient also reports a sensation of burning in the soles of his feet, described as if 'somebody stuck a needle in him.' This symptom has been ongoing for a while. The patient is currently on Flomax  for his urinary symptoms.  The patient also has a history of B12 deficiency and is on a regular regimen of B12 injections, which he was due to receive on the day of the consultation. He has not seen a urologist in several years.       Relevant past medical, surgical, family, and social history reviewed and updated as indicated.  Allergies and medications reviewed and updated. Data reviewed: Chart in Epic.   Past Medical History:  Diagnosis Date   Cerebral palsy (HCC)    Coronary artery disease    LAD 70% stenosis, cath 2019   Depression    GERD (gastroesophageal reflux disease)    Hypertension    Scoliosis    Seizures (HCC)    pt's sister states that patinet has had seizures in the past, pt camr for PAT by himself on last visit and she stst  they misunderstood what he was trying to say. Pt saw neurologist in March who did CT and states patient does not have seizures. On no meds, had seizures as child.    Past Surgical History:  Procedure Laterality Date   CARPAL TUNNEL RELEASE Right 08/13/2016   Procedure: CARPAL TUNNEL RELEASE;  Surgeon: Taft FORBES Minerva, MD;  Location: [facility];  Service: Orthopedics;  Laterality: Right;   CORONARY ANGIOPLASTY WITH STENT PLACEMENT     LEFT HEART CATH AND CORONARY ANGIOGRAPHY N/A 07/10/2017   Procedure: LEFT HEART CATH AND CORONARY ANGIOGRAPHY;  Surgeon: Burnard Debby LABOR, MD;  Location: [facility];  Service: Cardiovascular;  Laterality: N/A;    Social History   Socioeconomic History   Marital status: Single    Spouse name: Not on file   Number of children: 0   Years of education: 12   Highest education level: High school graduate  Occupational History   Occupation: disabled  Tobacco Use   Smoking status: Every Day    Current packs/day: 0.00    Average packs/day: 0.5 packs/day for 41.0 years (20.5 ttl pk-yrs)    Types: Cigarettes    Start date: 06/06/1977    Last attempt to quit: 06/06/2018    Years since quitting: 4.7   Smokeless tobacco: Never  Vaping Use   Vaping status: Never Used  Substance and Sexual Activity   Alcohol use: Yes  Comment: once in a blue moon typically drinks beer 2 weeks or longer   Drug use: No   Sexual activity: Never    Birth control/protection: None  Other Topics Concern   Not on file  Social History Narrative   08/05/2018   Patient lives alone in a trailer. His sister Merlynn will take take him to the grocery store or bring things by for him. He usually sees her twice a week. His baby sister also checks in on him. He is able to perform most ADLs but is illiterate. He does not drive and depends on family or arranged transportation - usually [name]. States that his landlord are nice and lookout for him. He has an aide that comes 5 days per week  who helps him bathe, shop, pay bills, etc.   Social Determinants of Health   Financial Resource Strain: Low Risk  (02/25/2022)   Overall Financial Resource Strain (CARDIA)    Difficulty of Paying Living Expenses: Not hard at all  Food Insecurity: No Food Insecurity (02/25/2022)   Hunger Vital Sign    Worried About Running Out of Food in the Last Year: Never true    Ran Out of Food in the Last Year: Never true  Transportation Needs: No Transportation Needs (02/25/2022)   PRAPARE - Administrator, Civil Service (Medical): No    Lack of Transportation (Non-Medical): No  Physical Activity: Sufficiently Active (02/25/2022)   Exercise Vital Sign    Days of Exercise per Week: 7 days    Minutes of Exercise per Session: 30 min  Stress: No Stress Concern Present (02/25/2022)   [employer] of Occupational Health - Occupational Stress Questionnaire    Feeling of Stress : Not at all  Social Connections: Moderately Integrated (02/25/2022)   Social Connection and Isolation Panel [NHANES]    Frequency of Communication with Friends and Family: Three times a week    Frequency of Social Gatherings with Friends and Family: Once a week    Attends Religious Services: More than 4 times per year    Active Member of [employer] or Organizations: Yes    Attends Engineer, structural: More than 4 times per year    Marital Status: Never married  Intimate Partner Violence: Not At Risk (02/25/2022)   Humiliation, Afraid, Rape, and Kick questionnaire    Fear of Current or Ex-Partner: No    Emotionally Abused: No    Physically Abused: No    Sexually Abused: No    Outpatient Encounter Medications as of 02/17/2023  Medication Sig   acetaminophen  (TYLENOL ) 500 MG tablet Take 1 tablet (500 mg total) by mouth every 6 (six) hours as needed.   albuterol  (VENTOLIN  HFA) 108 (90 Base) MCG/ACT inhaler Inhale 2 puffs into the lungs every 6 (six) hours as needed for wheezing or shortness of breath.    aspirin  EC 81 MG tablet Take 1 tablet (81 mg total) by mouth daily.   atorvastatin  (LIPITOR) 40 MG tablet TAKE 1 TABLET ONCE DAILY AT 6PM   buPROPion  (WELLBUTRIN  SR) 150 MG 12 hr tablet TAKE ONE TABLET DAILY WITH BREAKFAST   cetaphil (CETAPHIL) lotion Apply 1 application topically 2 (two) times daily. For rash. Apply after washing with [employer] shower gel and rinsing with clear warm water.   DULoxetine  (CYMBALTA ) 60 MG capsule TAKE 1 CAPSULE ONCE DAILY WITH LARGEST MEAL   fexofenadine  (ALLEGRA ) 180 MG tablet Take 1 tablet (180 mg total) by mouth daily. For allergy symptoms  fluocinonide -emollient (LIDEX -E) 0.05 % cream Apply 1 application topically 2 (two) times daily. To affected areas   fluticasone  (FLONASE ) 50 MCG/ACT nasal spray Place 2 sprays into both nostrils daily as needed for allergies or rhinitis.   Fluticasone -Umeclidin-Vilant (TRELEGY ELLIPTA ) 100-62.5-25 MCG/ACT AEPB Inhale 1 puff into the lungs daily.   hydrocortisone  cream 1 % Apply 1 Application topically 2 (two) times daily.   isosorbide  mononitrate (IMDUR ) 60 MG 24 hr tablet TAKE ONE TABLET DAILY   metoprolol  tartrate (LOPRESSOR ) 25 MG tablet TAKE ONE TABLET TWICE DAILY   mirtazapine  (REMERON ) 15 MG tablet TAKE 1 TABLET AT BEDTIME AS NEEDED FOR SLEEP   nitroGLYCERIN  (NITROSTAT ) 0.4 MG SL tablet Place 1 tablet (0.4 mg total) under the tongue every 5 (five) minutes x 3 doses as needed for chest pain.   pantoprazole  (PROTONIX ) 40 MG tablet TAKE ONE TABLET TWICE DAILY   tamsulosin  (FLOMAX ) 0.4 MG CAPS capsule TAKE TWO CAPSULES DAILY AFTER SUPPER   triamcinolone  cream (KENALOG ) 0.1 % Apply 1 Application topically 2 (two) times daily.   Facility-Administered Encounter Medications as of 02/17/2023  Medication   cyanocobalamin  ((VITAMIN B-12)) injection 1,000 mcg    Allergies  Allergen Reactions   Chantix  [Varenicline  Tartrate] Nausea And Vomiting    Pertinent ROS per HPI, otherwise unremarkable      Objective:  BP  133/68   Pulse 80   Temp (!) 96.6 F (35.9 C) (Temporal)   Ht 6' 1 (1.854 m)   Wt 245 lb (111.1 kg)   BMI 32.32 kg/m    Wt Readings from Last 3 Encounters:  02/17/23 245 lb (111.1 kg)  01/27/23 241 lb (109.3 kg)  12/08/22 240 lb 9.6 oz (109.1 kg)    Physical Exam Vitals and nursing note reviewed.  Constitutional:      General: He is not in acute distress.    Appearance: Normal appearance. He is obese. He is not ill-appearing, toxic-appearing or diaphoretic.  HENT:     Head: Normocephalic and atraumatic.     Mouth/Throat:     Mouth: Mucous membranes are moist.  Eyes:     Conjunctiva/sclera: Conjunctivae normal.     Pupils: Pupils are equal, round, and reactive to light.  Cardiovascular:     Rate and Rhythm: Normal rate and regular rhythm.     Heart sounds: Normal heart sounds.  Pulmonary:     Effort: Pulmonary effort is normal.     Breath sounds: Normal breath sounds.  Abdominal:     General: Bowel sounds are normal.     Palpations: Abdomen is soft.     Tenderness: There is no abdominal tenderness. There is no right CVA tenderness or left CVA tenderness.  Musculoskeletal:     Cervical back: Neck supple.  Skin:    General: Skin is warm and dry.     Capillary Refill: Capillary refill takes less than 2 seconds.  Neurological:     General: No focal deficit present.     Mental Status: He is alert.  Psychiatric:        Mood and Affect: Mood normal.        Behavior: Behavior normal.           Results for orders placed or performed in visit on 01/27/23  Urine Culture   Specimen: Urine   UR  Result Value Ref Range   Urine Culture, Routine Final report    Organism ID, Bacteria Comment   Microscopic Examination   Urine  Result Value Ref Range  WBC, UA 0-5 0 - 5 /hpf   RBC, Urine 0-2 0 - 2 /hpf   Epithelial Cells (non renal) 0-10 0 - 10 /hpf   Renal Epithel, UA None seen None seen /hpf   Casts Present (A) None seen /lpf   Cast Type Hyaline casts N/A   Mucus,  UA Present (A) Not Estab.   Bacteria, UA None seen None seen/Few   Yeast, UA None seen None seen  Urinalysis, Routine w reflex microscopic  Result Value Ref Range   Specific Gravity, UA 1.025 1.005 - 1.030   pH, UA 6.0 5.0 - 7.5   Color, UA Orange Yellow   Appearance Ur Clear Clear   Leukocytes,UA Negative Negative   Protein,UA Trace (A) Negative/Trace   Glucose, UA Negative Negative   Ketones, UA Trace (A) Negative   RBC, UA Trace (A) Negative   Bilirubin, UA Negative Negative   Urobilinogen, Ur >8.0 (H) 0.2 - 1.0 mg/dL   Nitrite, UA Negative Negative   Microscopic Examination See below:        Pertinent labs & imaging results that were available during my care of the patient were reviewed by me and considered in my medical decision making.  Assessment & Plan:  Kazmir was seen today for urinary incontinence.  Diagnoses and all orders for this visit:  Other urinary incontinence -     Urinalysis, Routine w reflex microscopic -     Ambulatory referral to Urology -     PSA, total and free  Paresthesia of both feet -     Anemia Profile B -     CMP14+EGFR     Assessment and Plan    Urinary Incontinence Persistent symptoms despite treatment for suspected prostatitis. No signs of infection on recent urinalysis. Does require incontinence supplies due to continued incontinence. This includes depends, gloves, bed pads and wipes.  -Refer to Urology for further evaluation and management. -Check PSA today.  Paresthesia Reports burning sensation in feet. History of B12 deficiency. -Administer B12 injection today. -Order labs to check for B12 deficiency and anemia.  General Health Maintenance -Continue Flomax  as prescribed.          Continue all other maintenance medications.  Follow up plan: Return if symptoms worsen or fail to improve.   Continue healthy lifestyle choices, including diet (rich in fruits, vegetables, and lean proteins, and low in salt and simple  carbohydrates) and exercise (at least 30 minutes of moderate physical activity daily).  Educational handout given for urinary incontinence   The above assessment and management plan was discussed with the patient. The patient verbalized understanding of and has agreed to the management plan. Patient is aware to call the clinic if they develop any new symptoms or if symptoms persist or worsen. Patient is aware when to return to the clinic for a follow-up visit. Patient educated on when it is appropriate to go to the emergency department.   Rosaline Bruns, FNP-C [facility] [phone]

## 2023-02-18 LAB — ANEMIA PROFILE B
Basophils Absolute: 0.1 10*3/uL (ref 0.0–0.2)
Basos: 1 %
EOS (ABSOLUTE): 1 10*3/uL — ABNORMAL HIGH (ref 0.0–0.4)
Eos: 10 %
Ferritin: 159 ng/mL (ref 30–400)
Folate: 3.6 ng/mL (ref >3.0)
Hematocrit: 41.6 % (ref 37.5–51.0)
Hemoglobin: 14.5 g/dL (ref 13.0–17.7)
Immature Grans (Abs): 0 10*3/uL (ref 0.0–0.1)
Immature Granulocytes: 0 %
Iron Saturation: 22 % (ref 15–55)
Iron: 63 ug/dL (ref 38–169)
Lymphocytes Absolute: 3.8 10*3/uL — ABNORMAL HIGH (ref 0.7–3.1)
Lymphs: 37 %
MCH: 33.6 pg — ABNORMAL HIGH (ref 26.6–33.0)
MCHC: 34.9 g/dL (ref 31.5–35.7)
MCV: 97 fL (ref 79–97)
Monocytes Absolute: 0.4 10*3/uL (ref 0.1–0.9)
Monocytes: 4 %
Neutrophils Absolute: 4.9 10*3/uL (ref 1.4–7.0)
Neutrophils: 48 %
Platelets: 220 10*3/uL (ref 150–450)
RBC: 4.31 x10E6/uL (ref 4.14–5.80)
RDW: 12.6 % (ref 11.6–15.4)
Retic Ct Pct: 2.1 % (ref 0.6–2.6)
Total Iron Binding Capacity: 285 ug/dL (ref 250–450)
UIBC: 222 ug/dL (ref 111–343)
Vitamin B-12: >2000 pg/mL — ABNORMAL HIGH (ref 232–1245)
WBC: 10.2 10*3/uL (ref 3.4–10.8)

## 2023-02-18 LAB — CMP14+EGFR
ALT: 10 [IU]/L (ref 0–44)
AST: 13 IU/L (ref 0–40)
Albumin: 4.1 g/dL (ref 3.9–4.9)
Alkaline Phosphatase: 101 [IU]/L (ref 44–121)
BUN/Creatinine Ratio: 10 (ref 10–24)
BUN: 9 mg/dL (ref 8–27)
Bilirubin Total: 0.8 mg/dL (ref 0.0–1.2)
CO2: 29 mmol/L (ref 20–29)
Calcium: 9.2 mg/dL (ref 8.6–10.2)
Chloride: 97 mmol/L (ref 96–106)
Creatinine, Ser: 0.93 mg/dL (ref 0.76–1.27)
Globulin, Total: 3 g/dL (ref 1.5–4.5)
Glucose: 99 mg/dL (ref 70–99)
Potassium: 4 mmol/L (ref 3.5–5.2)
Sodium: 141 mmol/L (ref 134–144)
Total Protein: 7.1 g/dL (ref 6.0–8.5)
eGFR: 91 mL/min/{1.73_m2} (ref >59)

## 2023-02-18 LAB — PSA, TOTAL AND FREE
PSA, Free Pct: 50 %
PSA, Free: 0.1 ng/mL
Prostate Specific Ag, Serum: 0.2 ng/mL (ref 0.0–4.0)

## 2023-03-01 ENCOUNTER — Ambulatory Visit (INDEPENDENT_AMBULATORY_CARE_PROVIDER_SITE_OTHER): Payer: 59

## 2023-03-01 VITALS — Ht 72.0 in | Wt 245.0 lb

## 2023-03-01 DIAGNOSIS — Z122 Encounter for screening for malignant neoplasm of respiratory organs: Secondary | ICD-10-CM

## 2023-03-01 DIAGNOSIS — Z0000 Encounter for general adult medical examination without abnormal findings: Secondary | ICD-10-CM

## 2023-03-01 NOTE — Patient Instructions (Signed)
[name] , Thank you for taking time to come for your Medicare Wellness Visit. I appreciate your ongoing commitment to your health goals. Please review the following plan we discussed and let me know if I can assist you in the future.   Referrals/Orders/Follow-Ups/Clinician Recommendations: Aim for 30 minutes of exercise or brisk walking, 6-8 glasses of water, and 5 servings of fruits and vegetables each day.   This is a list of the screening recommended for you and due dates:  Health Maintenance  Topic Date Due   Screening for Lung Cancer  08/23/2014   Pneumonia Vaccine (2 of 2 - PPSV23 or PCV20) 04/04/2018   COVID-19 Vaccine (3 - Moderna risk series) 07/10/2020   DTaP/Tdap/Td vaccine (2 - Td or Tdap) 12/04/2021   Zoster (Shingles) Vaccine (2 of 2) 10/21/2022   Medicare Annual Wellness Visit  02/29/2024   Colon Cancer Screening  04/18/2025   Flu Shot  Completed   Hepatitis C Screening  Completed   HIV Screening  Completed   HPV Vaccine  Aged Out    Advanced directives: (Copy Requested) Please bring a copy of your health care power of attorney and living will to the office to be added to your chart at your convenience.  Next Medicare Annual Wellness Visit scheduled for next year: Yes  insert Preventive Care attachment Insert FALL PREVENTION attachment if needed

## 2023-03-01 NOTE — Progress Notes (Signed)
Subjective:   [name] is a 67 y.o. male who presents for Medicare Annual/Subsequent preventive examination.  Visit Complete: Virtual I connected with  [name] on 03/01/23 by a audio enabled telemedicine application and verified that I am speaking with the correct person using two identifiers.  Patient Location: Home  Provider Location: Home Office  I discussed the limitations of evaluation and management by telemedicine. The patient expressed understanding and agreed to proceed.  Vital Signs: Because this visit was a virtual/telehealth visit, some criteria may be missing or patient reported. Any vitals not documented were not able to be obtained and vitals that have been documented are patient reported.  Patient Medicare AWV questionnaire was completed by the patient on 03/01/2023; I have confirmed that all information answered by patient is correct and no changes since this date.  Cardiac Risk Factors include: advanced age (>70men, >65 women);male gender;smoking/ tobacco exposure;dyslipidemia     Objective:    Today's Vitals   03/01/23 1126  Weight: 245 lb (111.1 kg)  Height: 6' (1.829 m)   Body mass index is 33.23 kg/m.     03/01/2023   11:32 AM 02/25/2022   11:21 AM 09/11/2020    3:03 PM 05/31/2020   12:10 PM 08/23/2019   10:26 AM 08/22/2018   10:21 AM 06/27/2018    4:45 PM  Advanced Directives  Does Patient Have a Medical Advance Directive? No No Yes No No No No  Type of Surveyor, minerals;Living will      Copy of Healthcare Power of Attorney in Chart?   No - copy requested      Would patient like information on creating a medical advance directive? No - Patient declined No - Patient declined   No - Patient declined Yes (MAU/Ambulatory/Procedural Areas - Information given) No - Patient declined    Current Medications (verified) Outpatient Encounter Medications as of 03/01/2023  Medication Sig   acetaminophen (TYLENOL) 500 MG  tablet Take 1 tablet (500 mg total) by mouth every 6 (six) hours as needed.   albuterol (VENTOLIN HFA) 108 (90 Base) MCG/ACT inhaler Inhale 2 puffs into the lungs every 6 (six) hours as needed for wheezing or shortness of breath.   aspirin EC 81 MG tablet Take 1 tablet (81 mg total) by mouth daily.   atorvastatin (LIPITOR) 40 MG tablet TAKE 1 TABLET ONCE DAILY AT 6PM   buPROPion (WELLBUTRIN SR) 150 MG 12 hr tablet TAKE ONE TABLET DAILY WITH BREAKFAST   cetaphil (CETAPHIL) lotion Apply 1 application topically 2 (two) times daily. For rash. Apply after washing with [facility] shower gel and rinsing with clear warm water.   DULoxetine (CYMBALTA) 60 MG capsule TAKE 1 CAPSULE ONCE DAILY WITH LARGEST MEAL   fexofenadine (ALLEGRA) 180 MG tablet Take 1 tablet (180 mg total) by mouth daily. For allergy symptoms   fluocinonide-emollient (LIDEX-E) 0.05 % cream Apply 1 application topically 2 (two) times daily. To affected areas   fluticasone (FLONASE) 50 MCG/ACT nasal spray Place 2 sprays into both nostrils daily as needed for allergies or rhinitis.   Fluticasone-Umeclidin-Vilant (TRELEGY ELLIPTA) 100-62.5-25 MCG/ACT AEPB Inhale 1 puff into the lungs daily.   hydrocortisone cream 1 % Apply 1 Application topically 2 (two) times daily.   isosorbide mononitrate (IMDUR) 60 MG 24 hr tablet TAKE ONE TABLET DAILY   metoprolol tartrate (LOPRESSOR) 25 MG tablet TAKE ONE TABLET TWICE DAILY   mirtazapine (REMERON) 15 MG tablet TAKE 1 TABLET AT  BEDTIME AS NEEDED FOR SLEEP   nitroGLYCERIN (NITROSTAT) 0.4 MG SL tablet Place 1 tablet (0.4 mg total) under the tongue every 5 (five) minutes x 3 doses as needed for chest pain.   pantoprazole (PROTONIX) 40 MG tablet TAKE ONE TABLET TWICE DAILY   tamsulosin (FLOMAX) 0.4 MG CAPS capsule TAKE TWO CAPSULES DAILY AFTER SUPPER   triamcinolone cream (KENALOG) 0.1 % Apply 1 Application topically 2 (two) times daily.   Facility-Administered Encounter Medications as of 03/01/2023   Medication   cyanocobalamin ((VITAMIN B-12)) injection 1,000 mcg    Allergies (verified) Chantix [varenicline tartrate]   History: Past Medical History:  Diagnosis Date   Cerebral palsy (HCC)    Coronary artery disease    LAD 70% stenosis, cath 2019   Depression    GERD (gastroesophageal reflux disease)    Hypertension    Scoliosis    Seizures (HCC)    pt's sister states that patinet has had seizures in the past, pt camr for PAT by himself on last visit and she stst "they misunderstood what he was trying to say. Pt saw neurologist in March who did CT and states patient does not have seizures. On no meds, had seizures as child.   Past Surgical History:  Procedure Laterality Date   CARPAL TUNNEL RELEASE Right 08/13/2016   Procedure: CARPAL TUNNEL RELEASE;  Surgeon: Vickki Hearing, MD;  Location: [facility];  Service: Orthopedics;  Laterality: Right;   CORONARY ANGIOPLASTY WITH STENT PLACEMENT     LEFT HEART CATH AND CORONARY ANGIOGRAPHY N/A 07/10/2017   Procedure: LEFT HEART CATH AND CORONARY ANGIOGRAPHY;  Surgeon: Lennette Bihari, MD;  Location: [facility];  Service: Cardiovascular;  Laterality: N/A;   Family History  Problem Relation Age of Onset   CAD Father 75       CABG   Heart disease Father    CAD Brother    Diabetes Mother    Alcohol abuse Brother    CAD Sister    Heart failure Sister    Social History   Socioeconomic History   Marital status: Single    Spouse name: Not on file   Number of children: 0   Years of education: 12   Highest education level: High school graduate  Occupational History   Occupation: disabled  Tobacco Use   Smoking status: Every Day    Current packs/day: 0.00    Average packs/day: 0.5 packs/day for 41.0 years (20.5 ttl pk-yrs)    Types: Cigarettes    Start date: 06/06/1977    Last attempt to quit: 06/06/2018    Years since quitting: 4.7   Smokeless tobacco: Never  Vaping Use   Vaping status: Never Used  Substance and  Sexual Activity   Alcohol use: Yes    Comment: "once in a blue moon" typically drinks beer 2 weeks or longer   Drug use: No   Sexual activity: Never    Birth control/protection: None  Other Topics Concern   Not on file  Social History Narrative   08/05/2018   Patient lives alone in a trailer. His sister Alona Bene will take take him to the grocery store or bring things by for him. He usually sees her twice a week. His "baby sister" also checks in on him. He is able to perform most ADLs but is illiterate. He does not drive and depends on family or arranged transportation - usually [name]. States that his landlord are nice and lookout for him. He has an  aide that comes 5 days per week who helps him bathe, shop, pay bills, etc.   Social Determinants of Health   Financial Resource Strain: Low Risk  (03/01/2023)   Overall Financial Resource Strain (CARDIA)    Difficulty of Paying Living Expenses: Not hard at all  Food Insecurity: No Food Insecurity (03/01/2023)   Hunger Vital Sign    Worried About Running Out of Food in the Last Year: Never true    Ran Out of Food in the Last Year: Never true  Transportation Needs: No Transportation Needs (03/01/2023)   PRAPARE - Administrator, Civil Service (Medical): No    Lack of Transportation (Non-Medical): No  Physical Activity: Inactive (03/01/2023)   Exercise Vital Sign    Days of Exercise per Week: 0 days    Minutes of Exercise per Session: 0 min  Stress: No Stress Concern Present (03/01/2023)   [employer] of Occupational Health - Occupational Stress Questionnaire    Feeling of Stress : Not at all  Social Connections: Socially Isolated (03/01/2023)   Social Connection and Isolation Panel [NHANES]    Frequency of Communication with Friends and Family: More than three times a week    Frequency of Social Gatherings with Friends and Family: More than three times a week    Attends Religious Services: Never    Database administrator or  Organizations: No    Attends Engineer, structural: Never    Marital Status: Never married    Tobacco Counseling Ready to quit: No Counseling given: Not Answered   Clinical Intake:  Pre-visit preparation completed: Yes  Pain : No/denies pain     Nutritional Risks: None Diabetes: No  How often do you need to have someone help you when you read instructions, pamphlets, or other written materials from your doctor or pharmacy?: 1 - Never  Interpreter Needed?: No  Information entered by :: Renie Ora, LPN   Activities of Daily Living    03/01/2023   11:33 AM  In your present state of health, do you have any difficulty performing the following activities:  Hearing? 0  Vision? 0  Difficulty concentrating or making decisions? 0  Walking or climbing stairs? 0  Dressing or bathing? 0  Doing errands, shopping? 0  Preparing Food and eating ? N  Using the Toilet? N  In the past six months, have you accidently leaked urine? N  Do you have problems with loss of bowel control? N  Managing your Medications? N  Managing your Finances? N  Housekeeping or managing your Housekeeping? N    Patient Care Team: Mechele Claude, MD as PCP - General (Family Medicine) Rollene Rotunda, MD as PCP - Cardiology (Cardiology)  Indicate any recent Medical Services you may have received from other than [facility] providers in the past year (date may be approximate).     Assessment:   This is a routine wellness examination for [name].  Hearing/Vision screen Vision Screening - Comments:: Wears rx glasses - up to date with routine eye exams with  Dr.Le    Goals Addressed             This Visit's Progress    DIET - EAT MORE FRUITS AND VEGETABLES         Depression Screen    03/01/2023   11:30 AM 12/08/2022   11:35 AM 08/26/2022   11:21 AM 08/26/2022   11:20 AM 05/04/2022    9:50 AM 02/25/2022   11:22 AM 01/29/2022  10:08 AM  PHQ 2/9 Scores  PHQ - 2 Score 0 0 0 0 0 0 0  PHQ-  9 Score     0      Fall Risk    03/01/2023   11:28 AM 12/08/2022   11:35 AM 08/26/2022   11:20 AM 05/04/2022    9:50 AM 02/25/2022   11:22 AM  Fall Risk   Falls in the past year? 0 0 1 0 0  Number falls in past yr: 0  0    Injury with Fall? 0  0    Risk for fall due to : No Fall Risks  History of fall(s);Medication side effect    Follow up Falls prevention discussed  Falls evaluation completed      MEDICARE RISK AT HOME: Medicare Risk at Home Any stairs in or around the home?: Yes If so, are there any without handrails?: No Home free of loose throw rugs in walkways, pet beds, electrical cords, etc?: Yes Adequate lighting in your home to reduce risk of falls?: Yes Life alert?: No Use of a cane, walker or w/c?: No Grab bars in the bathroom?: Yes Shower chair or bench in shower?: Yes Elevated toilet seat or a handicapped toilet?: Yes  TIMED UP AND GO:  Was the test performed?  No    Cognitive Function:    03/08/2018    3:52 PM  MMSE - Mini Mental State Exam  Not completed: Unable to complete        03/01/2023   11:34 AM 03/01/2023   11:33 AM 02/25/2022   11:28 AM 08/23/2019   10:26 AM 08/22/2018   10:23 AM  6CIT Screen  What Year? 0 points 0 points 4 points 0 points 0 points  What month? 0 points 0 points 3 points 0 points 3 points  What time? 0 points 0 points 0 points 0 points 0 points  Count back from 20 0 points 0 points 4 points 4 points 4 points  Months in reverse 2 points 0 points 4 points 4 points 4 points  Repeat phrase 2 points 0 points 6 points 2 points 2 points  Total Score 4 points 0 points 21 points 10 points 13 points    Immunizations Immunization History  Administered Date(s) Administered   Influenza, Seasonal, Injecte, Preservative Fre 01/18/2023   Influenza,inj,Quad PF,6+ Mos 02/12/2014, 02/26/2015, 05/01/2016, 02/09/2017, 02/07/2018, 01/24/2019, 02/06/2020, 01/13/2021, 01/29/2022   Moderna Sars-Covid-2 Vaccination 05/15/2020, 06/12/2020    Pneumococcal Conjugate-13 02/07/2018   Tdap 12/05/2011   Zoster Recombinant(Shingrix) 08/26/2022    TDAP status: Due, Education has been provided regarding the importance of this vaccine. Advised may receive this vaccine at local pharmacy or Health Dept. Aware to provide a copy of the vaccination record if obtained from local pharmacy or Health Dept. Verbalized acceptance and understanding.  Flu Vaccine status: Due, Education has been provided regarding the importance of this vaccine. Advised may receive this vaccine at local pharmacy or Health Dept. Aware to provide a copy of the vaccination record if obtained from local pharmacy or Health Dept. Verbalized acceptance and understanding.  Pneumococcal vaccine status: Up to date  Covid-19 vaccine status: Completed vaccines  Qualifies for Shingles Vaccine? Yes   Zostavax completed Yes   Shingrix Completed?: Yes  Screening Tests Health Maintenance  Topic Date Due   Lung Cancer Screening  08/23/2014   Pneumonia Vaccine 28+ Years old (2 of 2 - PPSV23 or PCV20) 04/04/2018   COVID-19 Vaccine (3 - Moderna risk series) 07/10/2020  DTaP/Tdap/Td (2 - Td or Tdap) 12/04/2021   Zoster Vaccines- Shingrix (2 of 2) 10/21/2022   Medicare Annual Wellness (AWV)  02/29/2024   Colonoscopy  04/18/2025   INFLUENZA VACCINE  Completed   Hepatitis C Screening  Completed   HIV Screening  Completed   HPV VACCINES  Aged Out    Health Maintenance  Health Maintenance Due  Topic Date Due   Lung Cancer Screening  08/23/2014   Pneumonia Vaccine 66+ Years old (2 of 2 - PPSV23 or PCV20) 04/04/2018   COVID-19 Vaccine (3 - Moderna risk series) 07/10/2020   DTaP/Tdap/Td (2 - Td or Tdap) 12/04/2021   Zoster Vaccines- Shingrix (2 of 2) 10/21/2022    Colorectal cancer screening: Type of screening: Colonoscopy. Completed 04/19/2015. Repeat every 10 years  Lung Cancer Screening: (Low Dose CT Chest recommended if Age 19-80 years, 20 pack-year currently smoking OR  have quit w/in 15years.) does qualify.   Lung Cancer Screening Referral: 03/01/2023  Additional Screening:  Hepatitis C Screening: does not qualify; Completed 02/26/2015  Vision Screening: Recommended annual ophthalmology exams for early detection of glaucoma and other disorders of the eye. Is the patient up to date with their annual eye exam?  Yes  Who is the provider or what is the name of the office in which the patient attends annual eye exams? Dr.Le  If pt is not established with a provider, would they like to be referred to a provider to establish care? No .   Dental Screening: Recommended annual dental exams for proper oral hygiene   Community Resource Referral / Chronic Care Management: CRR required this visit?  No   CCM required this visit?  No     Plan:     I have personally reviewed and noted the following in the patient's chart:   Medical and social history Use of alcohol, tobacco or illicit drugs  Current medications and supplements including opioid prescriptions. Patient is not currently taking opioid prescriptions. Functional ability and status Nutritional status Physical activity Advanced directives List of other physicians Hospitalizations, surgeries, and ER visits in previous 12 months Vitals Screenings to include cognitive, depression, and falls Referrals and appointments  In addition, I have reviewed and discussed with patient certain preventive protocols, quality metrics, and best practice recommendations. A written personalized care plan for preventive services as well as general preventive health recommendations were provided to patient.     Lorrene Reid, LPN   56/21/3086   After Visit Summary: (MyChart) Due to this being a telephonic visit, the after visit summary with patients personalized plan was offered to patient via MyChart   Nurse Notes: Due TDAP/Pneumonia Vaccine

## 2023-03-04 ENCOUNTER — Ambulatory Visit: Payer: 59 | Admitting: Family Medicine

## 2023-03-06 ENCOUNTER — Other Ambulatory Visit: Payer: Self-pay | Admitting: Family Medicine

## 2023-03-06 DIAGNOSIS — I25118 Atherosclerotic heart disease of native coronary artery with other forms of angina pectoris: Secondary | ICD-10-CM

## 2023-03-06 DIAGNOSIS — K219 Gastro-esophageal reflux disease without esophagitis: Secondary | ICD-10-CM

## 2023-03-06 DIAGNOSIS — E785 Hyperlipidemia, unspecified: Secondary | ICD-10-CM

## 2023-03-15 ENCOUNTER — Other Ambulatory Visit: Payer: Self-pay

## 2023-03-15 ENCOUNTER — Telehealth: Payer: Self-pay | Admitting: Family Medicine

## 2023-03-15 NOTE — Telephone Encounter (Signed)
Urology Referral has been sent to [facility] as Patient requested.

## 2023-03-15 NOTE — Telephone Encounter (Signed)
Spoke with patient regarding this. He meant Urology; not Neurology. He wants his referral for Urology sent somewhere in [place] so he doesn't have to travel as far because sometimes its hard for him to find transportation.   Copied from CRM [id number]. Topic: General - Call Back - No Documentation >> Mar 15, 2023  9:37 AM Thomes Dinning wrote: Reason for CRM: PT returning call he recv'd about neurologist recommendations

## 2023-03-16 ENCOUNTER — Ambulatory Visit: Payer: 59 | Admitting: Family Medicine

## 2023-03-16 ENCOUNTER — Encounter: Payer: Self-pay | Admitting: Family Medicine

## 2023-03-16 ENCOUNTER — Telehealth: Payer: Self-pay | Admitting: Acute Care

## 2023-03-16 VITALS — BP 134/71 | HR 75 | Temp 97.7°F | Ht 72.0 in | Wt 242.8 lb

## 2023-03-16 DIAGNOSIS — E538 Deficiency of other specified B group vitamins: Secondary | ICD-10-CM | POA: Diagnosis not present

## 2023-03-16 DIAGNOSIS — Z23 Encounter for immunization: Secondary | ICD-10-CM

## 2023-03-16 DIAGNOSIS — R5383 Other fatigue: Secondary | ICD-10-CM

## 2023-03-16 MED ORDER — CYANOCOBALAMIN 1000 MCG/ML IJ SOLN
1000.0000 ug | Freq: Once | INTRAMUSCULAR | Status: AC
  Administered 2023-03-16: 1000 ug via INTRAMUSCULAR

## 2023-03-16 NOTE — Telephone Encounter (Signed)
Returned call and left VM.

## 2023-03-16 NOTE — Telephone Encounter (Signed)
Wants to do CT in [place]

## 2023-03-16 NOTE — Progress Notes (Signed)
Subjective:  Patient ID: [name], male    DOB: [birth year]  Age: 67 y.o. MRN: [id number]  CC: Medical Management of Chronic Issues   HPI GERREL SALMELA presents for check up. Smoker waitnig for CT lung screen. Wants it done in [place]. Also having incontience - wetting himself on the way to the bathroom daily. Has referral to Urology pending.   DOE - walking to [employer], up hill about 50 feet. Still smoking.      03/16/2023   10:25 AM 03/01/2023   11:30 AM 12/08/2022   11:35 AM  Depression screen PHQ 2/9  Decreased Interest 0 0 0  Down, Depressed, Hopeless 0 0 0  PHQ - 2 Score 0 0 0    History Conwell has a past medical history of Cerebral palsy (HCC), Coronary artery disease, Depression, GERD (gastroesophageal reflux disease), Hypertension, Scoliosis, and Seizures (HCC).   He has a past surgical history that includes Coronary angioplasty with stent; Carpal tunnel release (Right, 08/13/2016); and LEFT HEART CATH AND CORONARY ANGIOGRAPHY (N/A, 07/10/2017).   His family history includes Alcohol abuse in his brother; CAD in his brother and sister; CAD (age of onset: 92) in his father; Diabetes in his mother; Heart disease in his father; Heart failure in his sister.He reports that he has been smoking cigarettes. He started smoking about 45 years ago. He has a 20.5 pack-year smoking history. He has never used smokeless tobacco. He reports current alcohol use. He reports that he does not use drugs.    ROS Review of Systems  Constitutional:  Positive for fatigue. Negative for fever.  Respiratory:  Negative for shortness of breath.   Cardiovascular:  Negative for chest pain and leg swelling.  Musculoskeletal:  Negative for arthralgias.  Skin:  Negative for rash.    Objective:  BP 134/71   Pulse 75   Temp 97.7 F (36.5 C)   Ht 6' (1.829 m)   Wt 242 lb 12.8 oz (110.1 kg)   SpO2 94%   BMI 32.93 kg/m   BP Readings from Last 3 Encounters:  03/16/23 134/71  02/17/23  133/68  01/27/23 123/73    Wt Readings from Last 3 Encounters:  03/16/23 242 lb 12.8 oz (110.1 kg)  03/01/23 245 lb (111.1 kg)  02/17/23 245 lb (111.1 kg)     Physical Exam Vitals reviewed.  Constitutional:      General: He is not in acute distress.    Appearance: He is well-developed.  HENT:     Head: Normocephalic and atraumatic.     Right Ear: External ear normal.     Left Ear: External ear normal.     Nose: Nose normal.     Mouth/Throat:     Pharynx: No oropharyngeal exudate or posterior oropharyngeal erythema.  Eyes:     Conjunctiva/sclera: Conjunctivae normal.     Pupils: Pupils are equal, round, and reactive to light.  Cardiovascular:     Rate and Rhythm: Normal rate and regular rhythm.     Heart sounds: Normal heart sounds. No murmur heard. Pulmonary:     Effort: Pulmonary effort is normal. No respiratory distress.     Breath sounds: Normal breath sounds. No wheezing or rales.  Abdominal:     Palpations: Abdomen is soft.     Tenderness: There is no abdominal tenderness.  Musculoskeletal:        General: Normal range of motion.     Cervical back: Normal range of motion and neck supple.  Skin:  General: Skin is warm and dry.  Neurological:     Mental Status: He is alert and oriented to person, place, and time.     Deep Tendon Reflexes: Reflexes are normal and symmetric.  Psychiatric:        Behavior: Behavior normal.        Thought Content: Thought content normal.        Judgment: Judgment normal.       Assessment & Plan:   Xavy was seen today for medical management of chronic issues.  Diagnoses and all orders for this visit:  Fatigue, unspecified type -     CBC with Differential/Platelet -     CMP14+EGFR -     TSH + free T4  Need for Tdap vaccination -     Cancel: Tdap vaccine greater than or equal to 7yo IM  Need for pneumococcal 20-valent conjugate vaccination -     Pneumococcal conjugate vaccine 20-valent (Prevnar 20)  B12  deficiency -     cyanocobalamin (VITAMIN B12) injection 1,000 mcg       I have discontinued Nolon Bussing. Insalaco's fluocinonide-emollient and triamcinolone cream. I am also having him maintain his cetaphil, aspirin EC, fexofenadine, fluticasone, Trelegy Ellipta, albuterol, nitroGLYCERIN, acetaminophen, hydrocortisone cream, mirtazapine, isosorbide mononitrate, metoprolol tartrate, tamsulosin, buPROPion, DULoxetine, atorvastatin, and pantoprazole. We administered cyanocobalamin. We will continue to administer cyanocobalamin.  Allergies as of 03/16/2023       Reactions   Chantix [varenicline Tartrate] Nausea And Vomiting        Medication List        Accurate as of March 16, 2023  7:53 PM. If you have any questions, ask your nurse or doctor.          STOP taking these medications    fluocinonide-emollient 0.05 % cream Commonly known as: LIDEX-E Stopped by: Broadus John Haaris Metallo   triamcinolone cream 0.1 % Commonly known as: KENALOG Stopped by: Suprena Travaglini       TAKE these medications    acetaminophen 500 MG tablet Commonly known as: TYLENOL Take 1 tablet (500 mg total) by mouth every 6 (six) hours as needed.   albuterol 108 (90 Base) MCG/ACT inhaler Commonly known as: VENTOLIN HFA Inhale 2 puffs into the lungs every 6 (six) hours as needed for wheezing or shortness of breath.   aspirin EC 81 MG tablet Take 1 tablet (81 mg total) by mouth daily.   atorvastatin 40 MG tablet Commonly known as: LIPITOR TAKE 1 TABLET ONCE DAILY AT 6PM   buPROPion 150 MG 12 hr tablet Commonly known as: WELLBUTRIN SR TAKE ONE TABLET DAILY WITH BREAKFAST   cetaphil lotion Apply 1 application topically 2 (two) times daily. For rash. Apply after washing with [facility] shower gel and rinsing with clear warm water.   DULoxetine 60 MG capsule Commonly known as: CYMBALTA TAKE 1 CAPSULE ONCE DAILY WITH LARGEST MEAL   fexofenadine 180 MG tablet Commonly known as: ALLEGRA Take 1 tablet  (180 mg total) by mouth daily. For allergy symptoms   fluticasone 50 MCG/ACT nasal spray Commonly known as: FLONASE Place 2 sprays into both nostrils daily as needed for allergies or rhinitis.   hydrocortisone cream 1 % Apply 1 Application topically 2 (two) times daily.   isosorbide mononitrate 60 MG 24 hr tablet Commonly known as: IMDUR TAKE ONE TABLET DAILY   metoprolol tartrate 25 MG tablet Commonly known as: LOPRESSOR TAKE ONE TABLET TWICE DAILY   mirtazapine 15 MG tablet Commonly known as: REMERON TAKE 1  TABLET AT BEDTIME AS NEEDED FOR SLEEP   nitroGLYCERIN 0.4 MG SL tablet Commonly known as: NITROSTAT Place 1 tablet (0.4 mg total) under the tongue every 5 (five) minutes x 3 doses as needed for chest pain.   pantoprazole 40 MG tablet Commonly known as: PROTONIX TAKE ONE TABLET TWICE DAILY   tamsulosin 0.4 MG Caps capsule Commonly known as: FLOMAX TAKE TWO CAPSULES DAILY AFTER SUPPER   Trelegy Ellipta 100-62.5-25 MCG/ACT Aepb Generic drug: Fluticasone-Umeclidin-Vilant Inhale 1 puff into the lungs daily.         Follow-up: Return in about 6 months (around 09/13/2023).  Mechele Claude, M.D.

## 2023-03-16 NOTE — Telephone Encounter (Signed)
Called patient two more times and left VM

## 2023-03-17 ENCOUNTER — Telehealth: Payer: Self-pay | Admitting: Family Medicine

## 2023-03-17 LAB — CBC WITH DIFFERENTIAL/PLATELET
Basophils Absolute: 0.1 10*3/uL (ref 0.0–0.2)
Basos: 1 %
EOS (ABSOLUTE): 1.3 10*3/uL — ABNORMAL HIGH (ref 0.0–0.4)
Eos: 12 %
Hematocrit: 41.6 % (ref 37.5–51.0)
Hemoglobin: 14.6 g/dL (ref 13.0–17.7)
Immature Grans (Abs): 0 10*3/uL (ref 0.0–0.1)
Immature Granulocytes: 0 %
Lymphocytes Absolute: 3.9 10*3/uL — ABNORMAL HIGH (ref 0.7–3.1)
Lymphs: 37 %
MCH: 33.6 pg — ABNORMAL HIGH (ref 26.6–33.0)
MCHC: 35.1 g/dL (ref 31.5–35.7)
MCV: 96 fL (ref 79–97)
Monocytes Absolute: 0.5 10*3/uL (ref 0.1–0.9)
Monocytes: 4 %
Neutrophils Absolute: 4.9 10*3/uL (ref 1.4–7.0)
Neutrophils: 46 %
Platelets: 204 10*3/uL (ref 150–450)
RBC: 4.35 x10E6/uL (ref 4.14–5.80)
RDW: 11.4 % — ABNORMAL LOW (ref 11.6–15.4)
WBC: 10.7 10*3/uL (ref 3.4–10.8)

## 2023-03-17 LAB — CMP14+EGFR
ALT: 10 IU/L (ref 0–44)
AST: 12 IU/L (ref 0–40)
Albumin: 4.2 g/dL (ref 3.9–4.9)
Alkaline Phosphatase: 94 IU/L (ref 44–121)
BUN/Creatinine Ratio: 8 — ABNORMAL LOW (ref 10–24)
BUN: 8 mg/dL (ref 8–27)
Bilirubin Total: 0.7 mg/dL (ref 0.0–1.2)
CO2: 28 mmol/L (ref 20–29)
Calcium: 9.4 mg/dL (ref 8.6–10.2)
Chloride: 95 mmol/L — ABNORMAL LOW (ref 96–106)
Creatinine, Ser: 0.95 mg/dL (ref 0.76–1.27)
Globulin, Total: 2.9 g/dL (ref 1.5–4.5)
Glucose: 96 mg/dL (ref 70–99)
Potassium: 4.3 mmol/L (ref 3.5–5.2)
Sodium: 137 mmol/L (ref 134–144)
Total Protein: 7.1 g/dL (ref 6.0–8.5)
eGFR: 89 mL/min/{1.73_m2} (ref >59)

## 2023-03-17 LAB — TSH+FREE T4
Free T4: 1.04 ng/dL (ref 0.82–1.77)
TSH: 1.44 u[IU]/mL (ref 0.450–4.500)

## 2023-03-17 NOTE — Telephone Encounter (Signed)
Yes he can be scheduled at [facility].  I left 3 VM for the patient yesterday to call us to schedule.  He gave me his nurse aide's phone number also and I had left her a message Monday.  I've not been able to get in touch with either of them.

## 2023-03-17 NOTE — Telephone Encounter (Signed)
Copied from CRM [id number]. Topic: Clinical - Request for Lab/Test Order >> Mar 17, 2023  9:37 AM Adelina Mings wrote: Reason for CRM: nurse aide wants to know if he is able to go to [facility] hospital for ct scan

## 2023-03-21 NOTE — Progress Notes (Signed)
Hello [name],  Your lab result is normal and/or stable.Some minor variations that are not significant are commonly marked abnormal, but do not represent any medical problem for you.  Best regards, Virna Livengood, M.D.

## 2023-03-22 ENCOUNTER — Ambulatory Visit: Payer: 59 | Admitting: Physician Assistant

## 2023-03-22 DIAGNOSIS — Z91199 Patient's noncompliance with other medical treatment and regimen due to unspecified reason: Secondary | ICD-10-CM

## 2023-03-22 NOTE — Progress Notes (Signed)
Virtual Visit via Telephone Note  I was unable to reach Mr. Pawlik for his shared decision making visit.  Darcella Gasman Stanislawa Gaffin, PA-C

## 2023-04-01 ENCOUNTER — Telehealth: Payer: Self-pay | Admitting: *Deleted

## 2023-04-01 NOTE — Telephone Encounter (Signed)
[phone]. Please call about LCS.Try to call in the AM so his nurse aide can assist as well in understanding.

## 2023-04-01 NOTE — Telephone Encounter (Signed)
Spoke with pt. He stated his nurse aide has left for the day. I advised that I will call back in the morning when she returns. PT verbalized understanding.

## 2023-04-01 NOTE — Progress Notes (Signed)
Name: [name] DOB: 01-18-1956 MRN: [id number]  History of Present Illness: [name] is a 67 y.o. male who presents today as a new patient at [facility]. All available relevant medical records have been reviewed. He is accompanied by his nurse aid [name], who assists with giving today's history. Patient is a questionable historian.  Recent history:  > 01/27/2023:  - Seen by PCP for urinary incontinence.  - Urine microscopy negative for WBC, RBC, or bacteria.  - Negative urine culture.  - Treated with Cipro 500 mg 2x/day x days for suspected prostatitis.   > 02/17/2023:  - Seen by PCP for recheck; no improvement of urinary incontinence with antibiotic treatment. "The symptoms are more pronounced at night but can occur during the day, particularly before using the bathroom. The patient denies any associated pain or discomfort." - Urine microscopy negative for WBC, RBC, or bacteria.  - Negative urine culture.  - Normal PSA (0.2).   Today: He reports chief complaint of urinary incontinence.  He reports urge incontinence. He reports occasional stress incontinence with sneeze. He leaks multiple times per day; he refuses to wear pads or diapers but sometimes has to change his clothes due to leakage.  He reports urinary frequency and urgency. Denies nocturia.   He reports significant caffeine intake (coffee & sodas).   He is taking Flomax 0.4 mg daily.   He denies dysuria, gross hematuria, straining to void, or sensations of incomplete emptying.   Fall Screening: Do you usually have a device to assist in your mobility? No   Medications: Current Outpatient Medications  Medication Sig Dispense Refill   acetaminophen (TYLENOL) 500 MG tablet Take 1 tablet (500 mg total) by mouth every 6 (six) hours as needed. 30 tablet 0   albuterol (VENTOLIN HFA) 108 (90 Base) MCG/ACT inhaler Inhale 2 puffs into the lungs every 6 (six) hours as needed for wheezing or  shortness of breath. 1 each 11   aspirin EC 81 MG tablet Take 1 tablet (81 mg total) by mouth daily. 30 tablet 11   atorvastatin (LIPITOR) 40 MG tablet TAKE 1 TABLET ONCE DAILY AT 6PM 90 tablet 0   buPROPion (WELLBUTRIN SR) 150 MG 12 hr tablet TAKE ONE TABLET DAILY WITH BREAKFAST 90 tablet 1   cetaphil (CETAPHIL) lotion Apply 1 application topically 2 (two) times daily. For rash. Apply after washing with [facility] shower gel and rinsing with clear warm water. 473 mL 0   DULoxetine (CYMBALTA) 60 MG capsule TAKE 1 CAPSULE ONCE DAILY WITH LARGEST MEAL 90 capsule 1   fexofenadine (ALLEGRA) 180 MG tablet Take 1 tablet (180 mg total) by mouth daily. For allergy symptoms 90 tablet 2   fluticasone (FLONASE) 50 MCG/ACT nasal spray Place 2 sprays into both nostrils daily as needed for allergies or rhinitis. 48 g 3   Fluticasone-Umeclidin-Vilant (TRELEGY ELLIPTA) 100-62.5-25 MCG/ACT AEPB Inhale 1 puff into the lungs daily. 1 each 11   hydrocortisone cream 1 % Apply 1 Application topically 2 (two) times daily. 30 g 0   isosorbide mononitrate (IMDUR) 60 MG 24 hr tablet TAKE ONE TABLET DAILY 90 tablet 1   metoprolol tartrate (LOPRESSOR) 25 MG tablet TAKE ONE TABLET TWICE DAILY 180 tablet 1   mirtazapine (REMERON) 15 MG tablet TAKE 1 TABLET AT BEDTIME AS NEEDED FOR SLEEP 90 tablet 1   nitroGLYCERIN (NITROSTAT) 0.4 MG SL tablet Place 1 tablet (0.4 mg total) under the tongue every 5 (five) minutes x 3 doses as  needed for chest pain. 25 tablet 4   pantoprazole (PROTONIX) 40 MG tablet TAKE ONE TABLET TWICE DAILY 180 tablet 0   tamsulosin (FLOMAX) 0.4 MG CAPS capsule TAKE TWO CAPSULES DAILY AFTER SUPPER 180 capsule 1   Current Facility-Administered Medications  Medication Dose Route Frequency Provider Last Rate Last Admin   cyanocobalamin ((VITAMIN B-12)) injection 1,000 mcg  1,000 mcg Intramuscular Q30 days Mechele Claude, MD   1,000 mcg at 02/17/23 1003    Allergies: Allergies  Allergen Reactions   Chantix  [Varenicline Tartrate] Nausea And Vomiting    Past Medical History:  Diagnosis Date   Cerebral palsy (HCC)    Coronary artery disease    LAD 70% stenosis, cath 2019   Depression    GERD (gastroesophageal reflux disease)    Hypertension    Scoliosis    Seizures (HCC)    pt's sister states that patinet has had seizures in the past, pt camr for PAT by himself on last visit and she stst "they misunderstood what he was trying to say. Pt saw neurologist in March who did CT and states patient does not have seizures. On no meds, had seizures as child.   Past Surgical History:  Procedure Laterality Date   CARPAL TUNNEL RELEASE Right 08/13/2016   Procedure: CARPAL TUNNEL RELEASE;  Surgeon: Vickki Hearing, MD;  Location: [facility];  Service: Orthopedics;  Laterality: Right;   CORONARY ANGIOPLASTY WITH STENT PLACEMENT     LEFT HEART CATH AND CORONARY ANGIOGRAPHY N/A 07/10/2017   Procedure: LEFT HEART CATH AND CORONARY ANGIOGRAPHY;  Surgeon: Lennette Bihari, MD;  Location: [facility];  Service: Cardiovascular;  Laterality: N/A;   Family History  Problem Relation Age of Onset   CAD Father 59       CABG   Heart disease Father    CAD Brother    Diabetes Mother    Alcohol abuse Brother    CAD Sister    Heart failure Sister    Social History   Socioeconomic History   Marital status: Single    Spouse name: Not on file   Number of children: 0   Years of education: 12   Highest education level: High school graduate  Occupational History   Occupation: disabled  Tobacco Use   Smoking status: Every Day    Current packs/day: 0.00    Average packs/day: 0.5 packs/day for 41.0 years (20.5 ttl pk-yrs)    Types: Cigarettes    Start date: 06/06/1977    Last attempt to quit: 06/06/2018    Years since quitting: 4.8   Smokeless tobacco: Never  Vaping Use   Vaping status: Never Used  Substance and Sexual Activity   Alcohol use: Yes    Comment: "once in a blue moon" typically drinks beer 2  weeks or longer   Drug use: No   Sexual activity: Never    Birth control/protection: None  Other Topics Concern   Not on file  Social History Narrative   08/05/2018   Patient lives alone in a trailer. His sister Alona Bene will take take him to the grocery store or bring things by for him. He usually sees her twice a week. His "baby sister" also checks in on him. He is able to perform most ADLs but is illiterate. He does not drive and depends on family or arranged transportation - usually [name]. States that his landlord are nice and lookout for him. He has an aide that comes 5 days per week  who helps him bathe, shop, pay bills, etc.   Social Drivers of Health   Financial Resource Strain: Low Risk  (03/01/2023)   Overall Financial Resource Strain (CARDIA)    Difficulty of Paying Living Expenses: Not hard at all  Food Insecurity: No Food Insecurity (03/01/2023)   Hunger Vital Sign    Worried About Running Out of Food in the Last Year: Never true    Ran Out of Food in the Last Year: Never true  Transportation Needs: No Transportation Needs (03/01/2023)   PRAPARE - Administrator, Civil Service (Medical): No    Lack of Transportation (Non-Medical): No  Physical Activity: Inactive (03/01/2023)   Exercise Vital Sign    Days of Exercise per Week: 0 days    Minutes of Exercise per Session: 0 min  Stress: No Stress Concern Present (03/01/2023)   [employer] of Occupational Health - Occupational Stress Questionnaire    Feeling of Stress : Not at all  Social Connections: Socially Isolated (03/01/2023)   Social Connection and Isolation Panel [NHANES]    Frequency of Communication with Friends and Family: More than three times a week    Frequency of Social Gatherings with Friends and Family: More than three times a week    Attends Religious Services: Never    Database administrator or Organizations: No    Attends Banker Meetings: Never    Marital Status: Never married   Intimate Partner Violence: Not At Risk (03/01/2023)   Humiliation, Afraid, Rape, and Kick questionnaire    Fear of Current or Ex-Partner: No    Emotionally Abused: No    Physically Abused: No    Sexually Abused: No    SUBJECTIVE  Review of Systems Constitutional: Patient denies any unintentional weight loss or change in strength lntegumentary: Patient denies any rashes or pruritus Cardiovascular: Patient denies chest pain or syncope Respiratory: Patient denies shortness of breath Gastrointestinal: Patient denies nausea, vomiting, constipation, or diarrhea Musculoskeletal: Patient denies muscle cramps or weakness Neurologic: Patient denies convulsions or seizures Allergic/Immunologic: Patient denies recent allergic reaction(s) Hematologic/Lymphatic: Patient denies bleeding tendencies Endocrine: Patient denies heat/cold intolerance  GU: As per HPI.  OBJECTIVE Vitals:   04/06/23 0943  BP: 114/73  Pulse: 73  Temp: 99.1 F (37.3 C)   There is no height or weight on file to calculate BMI.  Physical Examination Constitutional: No obvious distress; patient is non-toxic appearing  Cardiovascular: No visible lower extremity edema.  Respiratory: The patient does not have audible wheezing/stridor; respirations do not appear labored  Gastrointestinal: Abdomen non-distended Musculoskeletal: Normal ROM of UEs  Skin: No obvious rashes/open sores  Neurologic: CN 2-12 grossly intact Psychiatric: Answered questions appropriately with normal affect  Hematologic/Lymphatic/Immunologic: No obvious bruises or sites of spontaneous bleeding  UA: 1+ blood, however on microscopy had 0-2 RBC/hpf. No evidence of UTI.  PVR: 0 ml  ASSESSMENT Urinary incontinence, unspecified type - Plan: Urinalysis, Routine w reflex microscopic, BLADDER SCAN AMB NON-IMAGING  For treatment of OAB with urinary frequency, nocturia, urgency, and urge incontinence: We discussed the symptoms of overactive bladder  (OAB), which include urinary urgency, frequency, nocturia, with or without urge incontinence.   While we may not know the exact etiology of OAB, several risk factors can be identified.  - We discussed this patient's neurogenic risk factors for OAB-type symptoms including cerebral palsy, seizures, BLE paresthesia, smoker. - Likely exacerbated by his significant caffeine intake.   We discussed the following management options in detail including potential  benefits, risks, and side effects: Behavioral therapy: Modify fluid intake Decreasing bladder irritants (such as caffeine) Bladder retraining / timed voiding Medication(s):  He decided to proceed with behavioral modifications including minimizing caffeine intake and working on timed voiding.   Will plan for follow up in 3 months or sooner if needed. Pt verbalized understanding and agreement. All questions were answered.  PLAN Advised the following: 1. Minimize caffeine intake. 2. Timed voiding every 2 hours while awake. 3. Continue Flomax 0.4 mg daily. 4. Return in about 3 months (around 07/05/2023) for UA, PVR, & f/u with Evette Georges NP.  Orders Placed This Encounter  Procedures   Microscopic Examination   Urinalysis, Routine w reflex microscopic   BLADDER SCAN AMB NON-IMAGING    It has been explained that the patient is to follow regularly with their PCP in addition to all other providers involved in their care and to follow instructions provided by these respective offices. Patient advised to contact urology clinic if any urologic-pertaining questions, concerns, new symptoms or problems arise in the interim period.  There are no Patient Instructions on file for this visit.  Electronically signed by:  Donnita Falls, MSN, FNP-C, CUNP 04/06/2023 11:00 AM

## 2023-04-02 ENCOUNTER — Other Ambulatory Visit: Payer: Self-pay

## 2023-04-02 DIAGNOSIS — Z87891 Personal history of nicotine dependence: Secondary | ICD-10-CM

## 2023-04-02 DIAGNOSIS — Z122 Encounter for screening for malignant neoplasm of respiratory organs: Secondary | ICD-10-CM

## 2023-04-02 DIAGNOSIS — F17210 Nicotine dependence, cigarettes, uncomplicated: Secondary | ICD-10-CM

## 2023-04-02 NOTE — Telephone Encounter (Signed)
Spoke with patient and his nurse aid Brandy. SDMV and CT scheduled.

## 2023-04-06 ENCOUNTER — Ambulatory Visit: Payer: 59 | Admitting: Urology

## 2023-04-06 ENCOUNTER — Encounter: Payer: Self-pay | Admitting: Urology

## 2023-04-06 VITALS — BP 114/73 | HR 73 | Temp 99.1°F

## 2023-04-06 DIAGNOSIS — N3281 Overactive bladder: Secondary | ICD-10-CM

## 2023-04-06 DIAGNOSIS — R351 Nocturia: Secondary | ICD-10-CM | POA: Diagnosis not present

## 2023-04-06 DIAGNOSIS — N3941 Urge incontinence: Secondary | ICD-10-CM

## 2023-04-06 DIAGNOSIS — R32 Unspecified urinary incontinence: Secondary | ICD-10-CM

## 2023-04-06 DIAGNOSIS — R350 Frequency of micturition: Secondary | ICD-10-CM

## 2023-04-06 LAB — URINALYSIS, ROUTINE W REFLEX MICROSCOPIC
Bilirubin, UA: NEGATIVE
Glucose, UA: NEGATIVE
Ketones, UA: NEGATIVE
Leukocytes,UA: NEGATIVE
Nitrite, UA: NEGATIVE
Protein,UA: NEGATIVE
Specific Gravity, UA: 1.02 (ref 1.005–1.030)
Urobilinogen, Ur: 1 mg/dL (ref 0.2–1.0)
pH, UA: 6 (ref 5.0–7.5)

## 2023-04-06 LAB — MICROSCOPIC EXAMINATION
Bacteria, UA: NONE SEEN
WBC, UA: NONE SEEN /[HPF] (ref 0–5)

## 2023-04-06 NOTE — Telephone Encounter (Signed)
NFN 

## 2023-04-07 NOTE — Progress Notes (Signed)
Established Patient Office Visit  Subjective  Patient ID: [name], male    DOB: Jun 09, 1955  Age: 67 y.o. MRN: [id number]  No chief complaint on file.   HPI  Patient Active Problem List   Diagnosis Date Noted   Right wrist pain 11/09/2022   Gastroesophageal reflux disease 08/28/2022   Centrilobular emphysema (HCC) 07/30/2021   B12 deficiency 07/30/2021   Back pain without sciatica 10/31/2020   Degenerative disc disease, lumbar 01/16/2019   Depression    Hypokalemia 06/27/2018   Seizure disorder (HCC) 02/07/2018   Apraxia of speech 10/13/2017   Left-sided weakness 10/13/2017   Coronary artery disease of native artery of native heart with stable angina pectoris (HCC)    Illiteracy 10/23/2016   Carpal tunnel syndrome of right wrist 06/15/2016   GAD (generalized anxiety disorder) 05/01/2016   History of MI (myocardial infarction) 02/26/2015   Dyslipidemia, goal LDL below 70 10/08/2009   Infantile cerebral palsy (HCC) 10/08/2009   Essential hypertension 10/08/2009   Past Medical History:  Diagnosis Date   Cerebral palsy (HCC)    Coronary artery disease    LAD 70% stenosis, cath 2019   Depression    GERD (gastroesophageal reflux disease)    Hypertension    Scoliosis    Seizures (HCC)    pt's sister states that patinet has had seizures in the past, pt camr for PAT by himself on last visit and she stst "they misunderstood what he was trying to say. Pt saw neurologist in March who did CT and states patient does not have seizures. On no meds, had seizures as child.   Past Surgical History:  Procedure Laterality Date   CARPAL TUNNEL RELEASE Right 08/13/2016   Procedure: CARPAL TUNNEL RELEASE;  Surgeon: Vickki Hearing, MD;  Location: [facility];  Service: Orthopedics;  Laterality: Right;   CORONARY ANGIOPLASTY WITH STENT PLACEMENT     LEFT HEART CATH AND CORONARY ANGIOGRAPHY N/A 07/10/2017   Procedure: LEFT HEART CATH AND CORONARY ANGIOGRAPHY;  Surgeon: Lennette Bihari,  MD;  Location: [facility];  Service: Cardiovascular;  Laterality: N/A;   Social History   Tobacco Use   Smoking status: Every Day    Current packs/day: 0.00    Average packs/day: 0.5 packs/day for 41.0 years (20.5 ttl pk-yrs)    Types: Cigarettes    Start date: 06/06/1977    Last attempt to quit: 06/06/2018    Years since quitting: 4.8   Smokeless tobacco: Never  Vaping Use   Vaping status: Never Used  Substance Use Topics   Alcohol use: Yes    Comment: "once in a blue moon" typically drinks beer 2 weeks or longer   Drug use: No   Social History   Socioeconomic History   Marital status: Single    Spouse name: Not on file   Number of children: 0   Years of education: 12   Highest education level: High school graduate  Occupational History   Occupation: disabled  Tobacco Use   Smoking status: Every Day    Current packs/day: 0.00    Average packs/day: 0.5 packs/day for 41.0 years (20.5 ttl pk-yrs)    Types: Cigarettes    Start date: 06/06/1977    Last attempt to quit: 06/06/2018    Years since quitting: 4.8   Smokeless tobacco: Never  Vaping Use   Vaping status: Never Used  Substance and Sexual Activity   Alcohol use: Yes    Comment: "once in a blue moon"  typically drinks beer 2 weeks or longer   Drug use: No   Sexual activity: Never    Birth control/protection: None  Other Topics Concern   Not on file  Social History Narrative   08/05/2018   Patient lives alone in a trailer. His sister Alona Bene will take take him to the grocery store or bring things by for him. He usually sees her twice a week. His "baby sister" also checks in on him. He is able to perform most ADLs but is illiterate. He does not drive and depends on family or arranged transportation - usually [name]. States that his landlord are nice and lookout for him. He has an aide that comes 5 days per week who helps him bathe, shop, pay bills, etc.   Social Drivers of Health   Financial Resource Strain: Low  Risk  (03/01/2023)   Overall Financial Resource Strain (CARDIA)    Difficulty of Paying Living Expenses: Not hard at all  Food Insecurity: No Food Insecurity (03/01/2023)   Hunger Vital Sign    Worried About Running Out of Food in the Last Year: Never true    Ran Out of Food in the Last Year: Never true  Transportation Needs: No Transportation Needs (03/01/2023)   PRAPARE - Administrator, Civil Service (Medical): No    Lack of Transportation (Non-Medical): No  Physical Activity: Inactive (03/01/2023)   Exercise Vital Sign    Days of Exercise per Week: 0 days    Minutes of Exercise per Session: 0 min  Stress: No Stress Concern Present (03/01/2023)   [employer] of Occupational Health - Occupational Stress Questionnaire    Feeling of Stress : Not at all  Social Connections: Socially Isolated (03/01/2023)   Social Connection and Isolation Panel [NHANES]    Frequency of Communication with Friends and Family: More than three times a week    Frequency of Social Gatherings with Friends and Family: More than three times a week    Attends Religious Services: Never    Database administrator or Organizations: No    Attends Banker Meetings: Never    Marital Status: Never married  Intimate Partner Violence: Not At Risk (03/01/2023)   Humiliation, Afraid, Rape, and Kick questionnaire    Fear of Current or Ex-Partner: No    Emotionally Abused: No    Physically Abused: No    Sexually Abused: No   Family Status  Relation Name Status   Father  Deceased   Brother  Deceased at age 55s   Mother  Deceased   Brother  Deceased at age 95s   Other single Alive   Other no children Alive   Sister  Armed forces training and education officer   Sister  Alive   Sister  Alive  No partnership data on file   Family History  Problem Relation Age of Onset   CAD Father 27       CABG   Heart disease Father    CAD Brother    Diabetes Mother    Alcohol abuse Brother    CAD Sister    Heart failure Sister     Allergies  Allergen Reactions   Chantix [Varenicline Tartrate] Nausea And Vomiting      ROS Negative unless indicated in HPI   Objective:     There were no vitals taken for this visit. BP Readings from Last 3 Encounters:  04/06/23 114/73  03/16/23 134/71  02/17/23 133/68   Wt Readings from Last 3 Encounters:  03/16/23 242 lb 12.8 oz (110.1 kg)  03/01/23 245 lb (111.1 kg)  02/17/23 245 lb (111.1 kg)      Physical Exam   No results found for any visits on 04/15/23.  Last CBC Lab Results  Component Value Date   WBC 10.7 03/16/2023   HGB 14.6 03/16/2023   HCT 41.6 03/16/2023   MCV 96 03/16/2023   MCH 33.6 (H) 03/16/2023   RDW 11.4 (L) 03/16/2023   PLT 204 03/16/2023   Last metabolic panel Lab Results  Component Value Date   GLUCOSE 96 03/16/2023   NA 137 03/16/2023   K 4.3 03/16/2023   CL 95 (L) 03/16/2023   CO2 28 03/16/2023   BUN 8 03/16/2023   CREATININE 0.95 03/16/2023   EGFR 89 03/16/2023   CALCIUM 9.4 03/16/2023   PROT 7.1 03/16/2023   ALBUMIN 4.2 03/16/2023   LABGLOB 2.9 03/16/2023   AGRATIO 1.3 08/26/2022   BILITOT 0.7 03/16/2023   ALKPHOS 94 03/16/2023   AST 12 03/16/2023   ALT 10 03/16/2023   ANIONGAP 9 06/30/2018   Last lipids Lab Results  Component Value Date   CHOL 97 (L) 08/26/2022   HDL 26 (L) 08/26/2022   LDLCALC 50 08/26/2022   TRIG 116 08/26/2022   CHOLHDL 3.7 08/26/2022   Last hemoglobin A1c Lab Results  Component Value Date   HGBA1C 5.0 07/11/2017   Last thyroid functions Lab Results  Component Value Date   TSH 1.440 03/16/2023   Last vitamin B12 and Folate Lab Results  Component Value Date   VITAMINB12 >2000 (H) 02/17/2023   FOLATE 3.6 02/17/2023        Assessment & Plan:  There are no diagnoses linked to this encounter. Continue healthy lifestyle choices, including diet (rich in fruits, vegetables, and lean proteins, and low in salt and simple carbohydrates) and exercise (at least 30 minutes of moderate  physical activity daily).     The above assessment and management plan was discussed with the patient. The patient verbalized understanding of and has agreed to the management plan. Patient is aware to call the clinic if they develop any new symptoms or if symptoms persist or worsen. Patient is aware when to return to the clinic for a follow-up visit. Patient educated on when it is appropriate to go to the emergency department.  No follow-ups on file.    Arrie Aran Santa Lighter, [facility] [address] [place], [place] [address] [phone]    Note: This document was prepared by [employer]ubin Milan voice dictation technology and any errors that results from this process are unintentional.

## 2023-04-09 ENCOUNTER — Ambulatory Visit (INDEPENDENT_AMBULATORY_CARE_PROVIDER_SITE_OTHER): Payer: 59 | Admitting: Acute Care

## 2023-04-09 DIAGNOSIS — F17210 Nicotine dependence, cigarettes, uncomplicated: Secondary | ICD-10-CM | POA: Diagnosis not present

## 2023-04-09 NOTE — Progress Notes (Addendum)
 Provider Attestation I agree with the documentation of the Shared Decision Making visit,  smoking cessation counseling if appropriate, and verification or eligibility for lung cancer screening as documented by the RN Nurse Navigator.   Raejean Bullock, MSN, AGACNP-BC [facility] See Amion for personal pager PCCM on call pager [phone]     Virtual Visit via Telephone Note  I connected with Armando Bukhari Opfer on 04/09/23 at  9:00 AM EST by telephone and verified that I am speaking with the correct person using two identifiers.  Location: Patient: [name] Provider: Alyse Bach, RN   I discussed the limitations, risks, security and privacy concerns of performing an evaluation and management service by telephone and the availability of in person appointments. I also discussed with the patient that there may be a patient responsible charge related to this service. The patient expressed understanding and agreed to proceed.   Shared Decision Making Visit Lung Cancer Screening Program [phone])   Eligibility: Age 67 y.o. Pack Years Smoking History Calculation 20 (# packs/per year x # years smoked) Recent History of coughing up blood  no Unexplained weight loss? no ( >Than 15 pounds within the last 6 months ) Prior History Lung / other cancer no (Diagnosis within the last 5 years already requiring surveillance chest CT Scans). Smoking Status Current Smoker Former Smokers: Years since quit: n/a  Quit Date: n/a  Visit Components: Discussion included one or more decision making aids. yes Discussion included risk/benefits of screening. yes Discussion included potential follow up diagnostic testing for abnormal scans. yes Discussion included meaning and risk of over diagnosis. yes Discussion included meaning and risk of False Positives. yes Discussion included meaning of total radiation exposure. yes  Counseling Included: Importance of adherence  to annual lung cancer LDCT screening. yes Impact of comorbidities on ability to participate in the program. yes Ability and willingness to under diagnostic treatment. yes  Smoking Cessation Counseling: Current Smokers:  Discussed importance of smoking cessation. yes Information about tobacco cessation classes and interventions provided to patient. yes Patient provided with "ticket" for LDCT Scan. no Symptomatic Patient. no  Counseling(Intermediate counseling: > three minutes) 99406 Diagnosis Code: Tobacco Use Z72.0 Asymptomatic Patient yes  Counseling (Intermediate counseling: > three minutes counseling) B1478 Former Smokers:  Discussed the importance of maintaining cigarette abstinence. yes Diagnosis Code: Personal History of Nicotine  Dependence. G95.621 Information about tobacco cessation classes and interventions provided to patient. Yes Patient provided with "ticket" for LDCT Scan. no Written Order for Lung Cancer Screening with LDCT placed in Epic. Yes (CT Chest Lung Cancer Screening Low Dose W/O CM) [id number] Z12.2-Screening of respiratory organs Z87.891-Personal history of nicotine  dependence   Alyse Bach, RN

## 2023-04-09 NOTE — Patient Instructions (Signed)

## 2023-04-15 ENCOUNTER — Ambulatory Visit (INDEPENDENT_AMBULATORY_CARE_PROVIDER_SITE_OTHER): Payer: 59

## 2023-04-15 DIAGNOSIS — E538 Deficiency of other specified B group vitamins: Secondary | ICD-10-CM | POA: Diagnosis not present

## 2023-04-15 NOTE — Progress Notes (Signed)
Cyanocobalamin given left deltoid. Pt tolerated well

## 2023-04-22 ENCOUNTER — Ambulatory Visit (HOSPITAL_COMMUNITY)
Admission: RE | Admit: 2023-04-22 | Discharge: 2023-04-22 | Disposition: A | Discharge status: Home / Self Care | Payer: 59 | Source: Ambulatory Visit | Attending: Cardiology | Admitting: Cardiology

## 2023-04-22 DIAGNOSIS — Z122 Encounter for screening for malignant neoplasm of respiratory organs: Secondary | ICD-10-CM | POA: Diagnosis not present

## 2023-04-22 DIAGNOSIS — F17210 Nicotine dependence, cigarettes, uncomplicated: Secondary | ICD-10-CM | POA: Insufficient documentation

## 2023-04-22 DIAGNOSIS — Z87891 Personal history of nicotine dependence: Secondary | ICD-10-CM | POA: Insufficient documentation

## 2023-04-23 ENCOUNTER — Other Ambulatory Visit: Payer: Self-pay | Admitting: Family Medicine

## 2023-04-23 DIAGNOSIS — I209 Angina pectoris, unspecified: Secondary | ICD-10-CM

## 2023-04-23 DIAGNOSIS — I25118 Atherosclerotic heart disease of native coronary artery with other forms of angina pectoris: Secondary | ICD-10-CM

## 2023-04-23 DIAGNOSIS — E785 Hyperlipidemia, unspecified: Secondary | ICD-10-CM

## 2023-04-23 DIAGNOSIS — N401 Enlarged prostate with lower urinary tract symptoms: Secondary | ICD-10-CM

## 2023-04-23 DIAGNOSIS — I10 Essential (primary) hypertension: Secondary | ICD-10-CM

## 2023-04-23 DIAGNOSIS — K219 Gastro-esophageal reflux disease without esophagitis: Secondary | ICD-10-CM

## 2023-04-23 DIAGNOSIS — F411 Generalized anxiety disorder: Secondary | ICD-10-CM

## 2023-04-23 DIAGNOSIS — G4701 Insomnia due to medical condition: Secondary | ICD-10-CM

## 2023-04-23 DIAGNOSIS — F3341 Major depressive disorder, recurrent, in partial remission: Secondary | ICD-10-CM

## 2023-05-03 ENCOUNTER — Other Ambulatory Visit: Payer: Self-pay

## 2023-05-03 DIAGNOSIS — Z122 Encounter for screening for malignant neoplasm of respiratory organs: Secondary | ICD-10-CM

## 2023-05-03 DIAGNOSIS — F17210 Nicotine dependence, cigarettes, uncomplicated: Secondary | ICD-10-CM

## 2023-05-03 DIAGNOSIS — Z87891 Personal history of nicotine dependence: Secondary | ICD-10-CM

## 2023-05-17 ENCOUNTER — Ambulatory Visit (INDEPENDENT_AMBULATORY_CARE_PROVIDER_SITE_OTHER): Payer: 59

## 2023-05-17 DIAGNOSIS — E538 Deficiency of other specified B group vitamins: Secondary | ICD-10-CM | POA: Diagnosis not present

## 2023-05-17 NOTE — Progress Notes (Signed)
Patient is in office today for a nurse visit for B12 Injection. Patient Injection was given in the  Left deltoid. Patient tolerated injection well.

## 2023-06-11 ENCOUNTER — Telehealth: Payer: Self-pay | Admitting: Family Medicine

## 2023-06-11 NOTE — Telephone Encounter (Signed)
Copied from CRM [id number]. Topic: Clinical - Lab/Test Results >> Jun 11, 2023  9:14 AM Fuller Mandril wrote: Reason for CRM: Patient had test done on lungs at the hospital and has not heard anything back. Would like to know if provider has results. Thank You

## 2023-06-13 NOTE — Telephone Encounter (Signed)
 It showed no evidence of lung cancer.

## 2023-06-14 NOTE — Telephone Encounter (Signed)
 [email or web address]

## 2023-06-16 ENCOUNTER — Ambulatory Visit: Payer: Self-pay | Admitting: Family Medicine

## 2023-06-16 NOTE — Telephone Encounter (Signed)
  Brandy [facility] nurse called for an appt for stomach pain below navel. Brandy not with pt so triage not completed. RN called pt to get full story but unable to reach him. Brandy need appt for tomorrow morning due to transportation.       Copied from CRM [id number]. Topic: Clinical - Red Word Triage >> Jun 16, 2023  1:18 PM Elle L wrote: Red Word that prompted transfer to Nurse Triage: Gearldine Bienenstock, the patient's nurse aid, states that the patient is having severe lower stomach pain and he is unsure of the cause or if he pulled a muscle. Answer Assessment - Initial Assessment Questions 1. LOCATION: "Where does it hurt?"     Below belly button 2. RADIATION: "Does the pain shoot anywhere else?" (e.g., chest, back)     na 3. ONSET: "When did the pain begin?" (Minutes, hours or days ago)      Not sure 4. SUDDEN: "Gradual or sudden onset?"     Not sure  5. PATTERN "Does the pain come and go, or is it constant?"    - If it comes and goes: "How long does it last?" "Do you have pain now?"     (Note: Comes and goes means the pain is intermittent. It goes away completely between bouts.)    - If constant: "Is it getting better, staying the same, or getting worse?"      (Note: Constant means the pain never goes away completely; most serious pain is constant and gets worse.)      Not sure  6. SEVERITY: "How bad is the pain?"  (e.g., Scale 1-10; mild, moderate, or severe)    - MILD (1-3): Doesn't interfere with normal activities, abdomen soft and not tender to touch.     - MODERATE (4-7): Interferes with normal activities or awakens from sleep, abdomen tender to touch.     - SEVERE (8-10): Excruciating pain, doubled over, unable to do any normal activities.       Not sure  7. RECURRENT SYMPTOM: "Have you ever had this type of stomach pain before?" If Yes, ask: "When was the last time?" and "What happened that time?"      Not sure  8. CAUSE: "What do you think is causing the stomach pain?"     Not sure  9.  RELIEVING/AGGRAVATING FACTORS: "What makes it better or worse?" (e.g., antacids, bending or twisting motion, bowel movement)     Not sure 10. OTHER SYMPTOMS: "Do you have any other symptoms?" (e.g., back pain, diarrhea, fever, urination pain, vomiting)       Not sure  Protocols used: Abdominal Pain - Male-A-AH

## 2023-06-16 NOTE — Telephone Encounter (Signed)
 Copied from CRM [id number]. Topic: Clinical - Red Word Triage >> Jun 16, 2023  1:39 PM Shon Hale wrote: Red Word that prompted transfer to Nurse Triage: Abdominal pain, requesting appointment. Reason for Disposition  [1] MODERATE pain (e.g., interferes with normal activities) AND [2] pain comes and goes (cramps) AND [3] present > 24 hours  (Exception: Pain with Vomiting or Diarrhea - see that Guideline.)  Additional Information  Commented on: Answer Assessment    Unable to get abd pain protocol to come up.  It hurts like a pulled muscle.  It's at the bottom of my stomach.  It started last weekend.  Below my belly button.   No diarrhea or vomiting or nausea.   It just feels like a pulled muscle.   No urinary burning, no frequency.   I have an aid that comes in and helps me.  I'm not having constipation.   3 weeks ago I was constipated but my aid gave me medicine that got things going. My aid asked if I had picked up anything heavy and I have not.    I had a massive heart attack many years ago.   I had a stress test done at [facility] and I have not heard anything about it.  If my aid was here she could tell you.   I don't know.   My aid comes back tomorrow.  She gives me B12 shots too.    My aid has been calling about the stress test.   The stress test was done at [facility].  Protocols used: Abdominal Pain - Male-A-AH  Chief Complaint: abd pain below his belly button   "Feels like a pulled muscle"   (Very difficult to triage as he was a poor historian and was jumping from subject to subject)    When I went to schedule him he already has an appt scheduled for 06/17/2023 at 9:30 with Harlow Mares.   He is also scheduled for Thur. Morning for a B12 injection.   He has an aid that comes in and takes care of him and brings him to his dr appts.   He mentioned she had been calling his drs this morning so I'm wondering if she is the one that called in and made this appt for him.    Symptoms:  abd pain below his belly button that started last weekend Frequency: since last weekend Pertinent Negatives: Patient denies diarrhea, vomiting, nausea, constipation, or urinary symptoms when asked.   Disposition: [] ED /[] Urgent Care (no appt availability in office) / [x] Appointment(In office/virtual)/ []  [facility]/ [] Home Care/ [] Refused Recommended Disposition /[] [facility]/ []  Follow-up with PCP Additional Notes: An appt was already scheduled for him for 06/17/2023.    See notes above.    It appears this pt has been triaged twice today.  Same disposition though.   In talking with him he had real difficulty with times and days and was jumping from subject to subject and about different tests at different hospitals.    Message sent to [facility] for their information.   (From what I could tell in talking with pt his aid is bringing him tomorrow for his appt with Harlow Mares).

## 2023-06-17 ENCOUNTER — Encounter: Payer: Self-pay | Admitting: Family Medicine

## 2023-06-17 ENCOUNTER — Ambulatory Visit (INDEPENDENT_AMBULATORY_CARE_PROVIDER_SITE_OTHER): Payer: 59 | Admitting: Family Medicine

## 2023-06-17 VITALS — BP 128/66 | HR 88 | Temp 97.6°F | Ht 72.0 in | Wt 248.8 lb

## 2023-06-17 DIAGNOSIS — R82998 Other abnormal findings in urine: Secondary | ICD-10-CM | POA: Diagnosis not present

## 2023-06-17 DIAGNOSIS — E538 Deficiency of other specified B group vitamins: Secondary | ICD-10-CM | POA: Diagnosis not present

## 2023-06-17 DIAGNOSIS — R1032 Left lower quadrant pain: Secondary | ICD-10-CM

## 2023-06-17 DIAGNOSIS — R1030 Lower abdominal pain, unspecified: Secondary | ICD-10-CM | POA: Diagnosis not present

## 2023-06-17 DIAGNOSIS — R10814 Left lower quadrant abdominal tenderness: Secondary | ICD-10-CM | POA: Diagnosis not present

## 2023-06-17 DIAGNOSIS — R10819 Abdominal tenderness, unspecified site: Secondary | ICD-10-CM | POA: Diagnosis not present

## 2023-06-17 DIAGNOSIS — R102 Pelvic and perineal pain: Secondary | ICD-10-CM

## 2023-06-17 LAB — CBC WITH DIFFERENTIAL/PLATELET
Basophils Absolute: 0.1 10*3/uL (ref 0.0–0.2)
Basos: 1 %
EOS (ABSOLUTE): 0.7 10*3/uL — ABNORMAL HIGH (ref 0.0–0.4)
Eos: 6 %
Hematocrit: 41.8 % (ref 37.5–51.0)
Hemoglobin: 14.6 g/dL (ref 13.0–17.7)
Immature Grans (Abs): 0 10*3/uL (ref 0.0–0.1)
Immature Granulocytes: 0 %
Lymphocytes Absolute: 3.5 10*3/uL — ABNORMAL HIGH (ref 0.7–3.1)
Lymphs: 29 %
MCH: 33.2 pg — ABNORMAL HIGH (ref 26.6–33.0)
MCHC: 34.9 g/dL (ref 31.5–35.7)
MCV: 95 fL (ref 79–97)
Monocytes Absolute: 0.8 10*3/uL (ref 0.1–0.9)
Monocytes: 6 %
Neutrophils Absolute: 6.9 10*3/uL (ref 1.4–7.0)
Neutrophils: 58 %
Platelets: 239 10*3/uL (ref 150–450)
RBC: 4.4 x10E6/uL (ref 4.14–5.80)
RDW: 12.6 % (ref 11.6–15.4)
WBC: 12 10*3/uL — ABNORMAL HIGH (ref 3.4–10.8)

## 2023-06-17 LAB — URINALYSIS, ROUTINE W REFLEX MICROSCOPIC
Bilirubin, UA: NEGATIVE
Glucose, UA: NEGATIVE
Leukocytes,UA: NEGATIVE
Nitrite, UA: NEGATIVE
Specific Gravity, UA: 1.025 (ref 1.005–1.030)
Urobilinogen, Ur: 4 mg/dL — ABNORMAL HIGH (ref 0.2–1.0)
pH, UA: 5.5 (ref 5.0–7.5)

## 2023-06-17 LAB — MICROSCOPIC EXAMINATION
Renal Epithel, UA: NONE SEEN /[HPF]
Yeast, UA: NONE SEEN

## 2023-06-17 LAB — CMP14+EGFR
ALT: 11 [IU]/L (ref 0–44)
AST: 16 [IU]/L (ref 0–40)
Albumin: 4.2 g/dL (ref 3.9–4.9)
Alkaline Phosphatase: 94 [IU]/L (ref 44–121)
BUN/Creatinine Ratio: 13 (ref 10–24)
BUN: 12 mg/dL (ref 8–27)
Bilirubin Total: 0.8 mg/dL (ref 0.0–1.2)
CO2: 26 mmol/L (ref 20–29)
Calcium: 9.7 mg/dL (ref 8.6–10.2)
Chloride: 97 mmol/L (ref 96–106)
Creatinine, Ser: 0.91 mg/dL (ref 0.76–1.27)
Globulin, Total: 3.7 g/dL (ref 1.5–4.5)
Glucose: 92 mg/dL (ref 70–99)
Potassium: 4.4 mmol/L (ref 3.5–5.2)
Sodium: 140 mmol/L (ref 134–144)
Total Protein: 7.9 g/dL (ref 6.0–8.5)
eGFR: 94 mL/min/{1.73_m2} (ref >59)

## 2023-06-17 MED ORDER — KETOROLAC TROMETHAMINE 30 MG/ML IJ SOLN
30.0000 mg | Freq: Once | INTRAMUSCULAR | Status: AC
  Administered 2023-06-17: 30 mg via INTRAMUSCULAR

## 2023-06-17 NOTE — Progress Notes (Signed)
 Acute Office Visit  Subjective:     Patient ID: [name], male    DOB: Mar 09, 1956, 68 y.o.   MRN: [id number]  Chief Complaint  Patient presents with   Abdominal Pain    Abdominal Pain This is a new problem. Episode onset: 12 days. The problem occurs constantly. The problem has been unchanged. The pain is located in the suprapubic region. The pain is moderate. The quality of the pain is sharp. The abdominal pain does not radiate. Associated symptoms include frequency (chronic) and hematuria (? dark). Pertinent negatives include no anorexia, constipation, diarrhea, dysuria, fever, hematochezia, melena, nausea, vomiting or weight loss. The pain is aggravated by urination and movement. The pain is relieved by Being still. He has tried oral narcotic analgesics for the symptoms. The treatment provided moderate relief.     Review of Systems  Constitutional:  Negative for fever and weight loss.  Gastrointestinal:  Positive for abdominal pain. Negative for anorexia, constipation, diarrhea, hematochezia, melena, nausea and vomiting.  Genitourinary:  Positive for frequency (chronic) and hematuria (? dark). Negative for dysuria.        Objective:    BP 128/66   Pulse 88   Temp 97.6 F (36.4 C) (Temporal)   Ht 6' (1.829 m)   Wt 248 lb 12.8 oz (112.9 kg)   SpO2 95%   BMI 33.74 kg/m  Wt Readings from Last 3 Encounters:  06/17/23 248 lb 12.8 oz (112.9 kg)  03/16/23 242 lb 12.8 oz (110.1 kg)  03/01/23 245 lb (111.1 kg)      Physical Exam Vitals and nursing note reviewed.  Constitutional:      General: He is not in acute distress.    Appearance: He is not ill-appearing, toxic-appearing or diaphoretic.  HENT:     Nose: Nose normal.     Mouth/Throat:     Mouth: Mucous membranes are moist.     Pharynx: Oropharynx is clear.  Eyes:     General: No scleral icterus. Cardiovascular:     Rate and Rhythm: Normal rate and regular rhythm.     Heart sounds: Normal heart sounds. No  murmur heard. Pulmonary:     Effort: Pulmonary effort is normal.     Breath sounds: Normal breath sounds.  Abdominal:     General: Abdomen is protuberant. Bowel sounds are normal.     Palpations: Abdomen is soft.     Tenderness: There is abdominal tenderness in the suprapubic area and left lower quadrant. There is no right CVA tenderness, left CVA tenderness, guarding or rebound. Negative signs include Murphy's sign and McBurney's sign.  Skin:    General: Skin is warm and dry.     Coloration: Skin is not jaundiced.  Neurological:     Mental Status: He is alert and oriented to person, place, and time. Mental status is at baseline.  Psychiatric:        Mood and Affect: Mood normal.        Behavior: Behavior normal.     Urine dipstick shows positive for RBC's, positive for protein, positive for urobilinogen, and positive for ketones.  Micro exam: 0-5 WBC's per HPF, 0-2 RBC's per HPF, and few+ bacteria.       Assessment & Plan:   [name] was seen today for abdominal pain.  Diagnoses and all orders for this visit:  Suprapubic tenderness -     ketorolac (TORADOL) 30 MG/ML injection 30 mg -     CT ABDOMEN PELVIS W CONTRAST; Future  Suprapubic pain, acute -     Urinalysis, Routine w reflex microscopic -     CBC with Differential/Platelet -     CMP14+EGFR -     ketorolac (TORADOL) 30 MG/ML injection 30 mg -     CT ABDOMEN PELVIS W CONTRAST; Future  Left lower quadrant abdominal tenderness without rebound tenderness -     ketorolac (TORADOL) 30 MG/ML injection 30 mg -     CT ABDOMEN PELVIS W CONTRAST; Future  Left lower quadrant abdominal pain -     CT ABDOMEN PELVIS W CONTRAST; Future  Dark urine -     Urinalysis, Routine w reflex microscopic -     CBC with Differential/Platelet -     CMP14+EGFR -     Urine Culture  Poor historian. Suprapubic pain and tenderness with LLQ tenderness on exam. No fever, nausea, vomiting, diarrhea, or constipation. UA not compelling for UTI.  Bilirubin present on UA. No jaundice or RUQ pain. Toradol IM today for pain. Do not take other NSAIDs with this. CBC and CMP pending. STAT CT with contrast ordered for further evaluation of pain especially as he is a poor historian. Discussed MSK pain vs nephrolithiasis vs diverticulitis. Discussed to go to ER for fever, worsening symptoms in the meantime.     The patient indicates understanding of these issues and agrees with the plan.   Gabriel Earing, FNP

## 2023-06-17 NOTE — Patient Instructions (Addendum)
 I have ordered labs to check for signs of infection, kidney function, and liver function.   Urine looks concentrated. Needs to increase water intake. Urine culture sent off to verify no infection.  I am working on getting a CT order for further evaluation. Our referral coordinator will be in contact.   We gave a Toradol injection today for pain. Do not take other NSAIDs (advil, aleve, naproxen, ibuprofen) with this. Ok to take tylenol.   Go to ER for nausea, vomiting, fever.

## 2023-06-17 NOTE — Addendum Note (Signed)
 Addended by: Gabriel Earing on: 06/17/2023 01:15 PM   Modules accepted: Orders

## 2023-06-18 ENCOUNTER — Ambulatory Visit: Payer: 59

## 2023-06-18 ENCOUNTER — Telehealth: Payer: Self-pay | Admitting: Family Medicine

## 2023-06-18 ENCOUNTER — Other Ambulatory Visit (HOSPITAL_COMMUNITY): Payer: 59

## 2023-06-18 LAB — URINE CULTURE

## 2023-06-18 NOTE — Telephone Encounter (Signed)
 Brandy aware to call [phone] op 3 to r/s  Copied from CRM [id number]. Topic: Appointments - Scheduling Inquiry for Clinic >> Jun 18, 2023  9:21 AM Herbert Seta B wrote: Reason for CRM: Patients nurse aide calling because patient needs 06/21/23 Ct Abdomen Pelvis W Contrast rescheduled due to transportation issues. [phone]

## 2023-06-21 ENCOUNTER — Telehealth: Payer: Self-pay | Admitting: Family Medicine

## 2023-06-21 ENCOUNTER — Ambulatory Visit (HOSPITAL_COMMUNITY): Payer: 59

## 2023-06-21 NOTE — Telephone Encounter (Signed)
 Copied from CRM [id number]. Topic: General - Call Back - No Documentation >> Jun 21, 2023 10:48 AM Louie Casa B wrote: Reason for CRM: patient nurse aid called back gave her lab results saying that pt can not get a sooner ct he was scheduled for 03/032025 but she wouldn't be able to take him because she would be done with him for the day by time the appt came and the soonest is 07/06/2023 please call pt back [phone] please call nurse aid to give clarification

## 2023-06-23 NOTE — Telephone Encounter (Signed)
 CT was originally ordered. I can reorder this STAT if he can go today. How is feeling?

## 2023-06-23 NOTE — Telephone Encounter (Signed)
 I spoke to pt and asked how he was feeling and he states he isn't having any abdominal pain anymore and feels fine. Per Tiffany pt doesn't need the CT anymore and pt is aware.

## 2023-06-24 ENCOUNTER — Telehealth: Payer: Self-pay | Admitting: Family Medicine

## 2023-06-24 NOTE — Telephone Encounter (Signed)
 Copied from CRM [id number]. Topic: Clinical - Request for Lab/Test Order >> Jun 24, 2023  9:01 AM Patsy Lager T wrote: Reason for CRM: Gearldine Bienenstock stated patient advised her a nurse called him yesterday and said he did not need to have the scan done however he still has an appt for a scan. Please contact Brandy at [phone] to advise.

## 2023-06-24 NOTE — Telephone Encounter (Signed)
 CT has been cancelled for Patient at this time :)

## 2023-07-06 ENCOUNTER — Ambulatory Visit (HOSPITAL_COMMUNITY): Payer: 59

## 2023-07-14 DIAGNOSIS — N3281 Overactive bladder: Secondary | ICD-10-CM | POA: Insufficient documentation

## 2023-07-14 DIAGNOSIS — N3941 Urge incontinence: Secondary | ICD-10-CM | POA: Insufficient documentation

## 2023-07-14 DIAGNOSIS — N400 Enlarged prostate without lower urinary tract symptoms: Secondary | ICD-10-CM | POA: Insufficient documentation

## 2023-07-14 NOTE — Progress Notes (Deleted)
 Name: [name] DOB: July 19, 1955 MRN: [id number]  History of Present Illness: [name] is a 68 y.o. male who presents today for follow up visit at [facility].  ***He is accompanied by ***. GU History includes: 1. BPH. - 02/17/2023: Normal PSA (0.2).  - Taking Flomax (Tamsulosin) 0.4 mg daily as prescribed by PCP. 2. OAB with urinary frequency, urgency, and urge incontinence. - Patient's neurogenic risk factors: Cerebral palsy, seizures, BLE paresthesia, smoker.  - Patient's exacerbating factors include: Significant caffeine intake. - Not a safe candidate for anticholinergic medications due to risk for side effects based on patient's age and comorbidities.  At 1st visit on 04/06/2023: - Reported urinary frequency, urgency, and urge incontinence. Leaking multiple times per day. - Reported significant caffeine intake (coffee & sodas).  - PVR = 0 ml.  - The plan was: 1. Minimize caffeine intake. 2. Timed voiding every 2 hours while awake. 3. Continue Flomax 0.4 mg daily.  Since last visit: ***  Today: He reports {Blank multiple:19197::"improved","persistent / unchanged"} urinary ***frequency, ***nocturia, ***urgency, and ***urge incontinence. Voiding ***x/day and ***x/night on average. Leaking ***x/day on average; using *** ***pads / ***diapers per day on average.  He {Actions; denies-reports:120008} significant caffeine intake (*** caffeinated beverages per day on average).  He {Actions; denies-reports:120008} dysuria, gross hematuria, straining to void, or sensations of incomplete emptying.   Medications: Current Outpatient Medications  Medication Sig Dispense Refill   acetaminophen (TYLENOL) 500 MG tablet Take 1 tablet (500 mg total) by mouth every 6 (six) hours as needed. 30 tablet 0   albuterol (VENTOLIN HFA) 108 (90 Base) MCG/ACT inhaler Inhale 2 puffs into the lungs every 6 (six) hours as needed for wheezing or shortness of breath. 1 each 11    aspirin EC 81 MG tablet Take 1 tablet (81 mg total) by mouth daily. 30 tablet 11   atorvastatin (LIPITOR) 40 MG tablet TAKE 1 TABLET ONCE DAILY AT 6PM 90 tablet 1   buPROPion (WELLBUTRIN SR) 150 MG 12 hr tablet TAKE ONE TABLET DAILY WITH BREAKFAST 90 tablet 1   cetaphil (CETAPHIL) lotion Apply 1 application topically 2 (two) times daily. For rash. Apply after washing with [facility] shower gel and rinsing with clear warm water. 473 mL 0   DULoxetine (CYMBALTA) 60 MG capsule TAKE 1 CAPSULE ONCE DAILY WITH LARGEST MEAL 90 capsule 1   fexofenadine (ALLEGRA) 180 MG tablet Take 1 tablet (180 mg total) by mouth daily. For allergy symptoms 90 tablet 2   fluticasone (FLONASE) 50 MCG/ACT nasal spray Place 2 sprays into both nostrils daily as needed for allergies or rhinitis. 48 g 3   Fluticasone-Umeclidin-Vilant (TRELEGY ELLIPTA) 100-62.5-25 MCG/ACT AEPB Inhale 1 puff into the lungs daily. 1 each 11   hydrocortisone cream 1 % Apply 1 Application topically 2 (two) times daily. 30 g 0   isosorbide mononitrate (IMDUR) 60 MG 24 hr tablet TAKE ONE TABLET DAILY 90 tablet 1   metoprolol tartrate (LOPRESSOR) 25 MG tablet TAKE ONE TABLET TWICE DAILY 180 tablet 1   mirtazapine (REMERON) 15 MG tablet TAKE 1 TABLET AT BEDTIME AS NEEDED FOR SLEEP 90 tablet 1   nitroGLYCERIN (NITROSTAT) 0.4 MG SL tablet Place 1 tablet (0.4 mg total) under the tongue every 5 (five) minutes x 3 doses as needed for chest pain. 25 tablet 4   pantoprazole (PROTONIX) 40 MG tablet TAKE ONE TABLET TWICE DAILY 180 tablet 1   tamsulosin (FLOMAX) 0.4 MG CAPS capsule TAKE TWO CAPSULES DAILY AFTER SUPPER  180 capsule 1   Current Facility-Administered Medications  Medication Dose Route Frequency Provider Last Rate Last Admin   cyanocobalamin ((VITAMIN B-12)) injection 1,000 mcg  1,000 mcg Intramuscular Q30 days Mechele Claude, MD   1,000 mcg at 06/17/23 0940    Allergies: Allergies  Allergen Reactions   Chantix [Varenicline Tartrate] Nausea And  Vomiting    Past Medical History:  Diagnosis Date   Cerebral palsy (HCC)    Coronary artery disease    LAD 70% stenosis, cath 2019   Depression    GERD (gastroesophageal reflux disease)    Hypertension    Scoliosis    Seizures (HCC)    pt's sister states that patinet has had seizures in the past, pt camr for PAT by himself on last visit and she stst "they misunderstood what he was trying to say. Pt saw neurologist in March who did CT and states patient does not have seizures. On no meds, had seizures as child.   Past Surgical History:  Procedure Laterality Date   CARPAL TUNNEL RELEASE Right 08/13/2016   Procedure: CARPAL TUNNEL RELEASE;  Surgeon: Vickki Hearing, MD;  Location: [facility];  Service: Orthopedics;  Laterality: Right;   CORONARY ANGIOPLASTY WITH STENT PLACEMENT     LEFT HEART CATH AND CORONARY ANGIOGRAPHY N/A 07/10/2017   Procedure: LEFT HEART CATH AND CORONARY ANGIOGRAPHY;  Surgeon: Lennette Bihari, MD;  Location: [facility];  Service: Cardiovascular;  Laterality: N/A;   Family History  Problem Relation Age of Onset   CAD Father 60       CABG   Heart disease Father    CAD Brother    Diabetes Mother    Alcohol abuse Brother    CAD Sister    Heart failure Sister    Social History   Socioeconomic History   Marital status: Single    Spouse name: Not on file   Number of children: 0   Years of education: 12   Highest education level: High school graduate  Occupational History   Occupation: disabled  Tobacco Use   Smoking status: Every Day    Current packs/day: 0.00    Average packs/day: 0.5 packs/day for 41.0 years (20.5 ttl pk-yrs)    Types: Cigarettes    Start date: 06/06/1977    Last attempt to quit: 06/06/2018    Years since quitting: 5.1   Smokeless tobacco: Never  Vaping Use   Vaping status: Never Used  Substance and Sexual Activity   Alcohol use: Yes    Comment: "once in a blue moon" typically drinks beer 2 weeks or longer   Drug use: No    Sexual activity: Never    Birth control/protection: None  Other Topics Concern   Not on file  Social History Narrative   08/05/2018   Patient lives alone in a trailer. His sister Alona Bene will take take him to the grocery store or bring things by for him. He usually sees her twice a week. His "baby sister" also checks in on him. He is able to perform most ADLs but is illiterate. He does not drive and depends on family or arranged transportation - usually [name]. States that his landlord are nice and lookout for him. He has an aide that comes 5 days per week who helps him bathe, shop, pay bills, etc.   Social Drivers of Health   Financial Resource Strain: Low Risk  (03/01/2023)   Overall Financial Resource Strain (CARDIA)    Difficulty of Paying  Living Expenses: Not hard at all  Food Insecurity: No Food Insecurity (03/01/2023)   Hunger Vital Sign    Worried About Running Out of Food in the Last Year: Never true    Ran Out of Food in the Last Year: Never true  Transportation Needs: No Transportation Needs (03/01/2023)   PRAPARE - Administrator, Civil Service (Medical): No    Lack of Transportation (Non-Medical): No  Physical Activity: Inactive (03/01/2023)   Exercise Vital Sign    Days of Exercise per Week: 0 days    Minutes of Exercise per Session: 0 min  Stress: No Stress Concern Present (03/01/2023)   [employer] of Occupational Health - Occupational Stress Questionnaire    Feeling of Stress : Not at all  Social Connections: Socially Isolated (03/01/2023)   Social Connection and Isolation Panel [NHANES]    Frequency of Communication with Friends and Family: More than three times a week    Frequency of Social Gatherings with Friends and Family: More than three times a week    Attends Religious Services: Never    Database administrator or Organizations: No    Attends Banker Meetings: Never    Marital Status: Never married  Intimate Partner Violence: Not  At Risk (03/01/2023)   Humiliation, Afraid, Rape, and Kick questionnaire    Fear of Current or Ex-Partner: No    Emotionally Abused: No    Physically Abused: No    Sexually Abused: No    Review of Systems Constitutional: Patient denies any unintentional weight loss or change in strength lntegumentary: Patient denies any rashes or pruritus Eyes: Patient {Actions; denies-reports:120008} dry eyes ENT: Patient {Actions; denies-reports:120008} dry mouth Cardiovascular: Patient denies chest pain or syncope Respiratory: Patient denies shortness of breath Gastrointestinal: ***Patient {Actions; denies-reports:120008} ***nausea, ***vomiting, ***constipation, ***diarrhea ***As per HPI Musculoskeletal: Patient denies muscle cramps or weakness Neurologic: Patient denies convulsions or seizures Allergic/Immunologic: Patient denies recent allergic reaction(s) Hematologic/Lymphatic: Patient denies bleeding tendencies Endocrine: Patient denies heat/cold intolerance  GU: As per HPI.  OBJECTIVE There were no vitals filed for this visit. There is no height or weight on file to calculate BMI.  Physical Examination Constitutional: No obvious distress; patient is non-toxic appearing  Cardiovascular: No visible lower extremity edema.  Respiratory: The patient does not have audible wheezing/stridor; respirations do not appear labored  Gastrointestinal: Abdomen non-distended Musculoskeletal: Normal ROM of UEs  Skin: No obvious rashes/open sores  Neurologic: CN 2-12 grossly intact Psychiatric: Answered questions appropriately with normal affect  Hematologic/Lymphatic/Immunologic: No obvious bruises or sites of spontaneous bleeding  UA: ***negative ***positive for *** leukocytes, *** blood, ***nitrites Urine microscopy: *** WBC/hpf, *** RBC/hpf, *** bacteria ***otherwise unremarkable ***glucosuria (secondary to ***Jardiance ***Farxiga use)  PVR: *** ml  ASSESSMENT No diagnosis found.  *** We  discussed the symptoms of overactive bladder (OAB), which include urinary urgency, frequency, nocturia, with or without urge incontinence.  While we may not know the exact etiology of OAB, several risk factors can be identified.  - Patient's neurogenic risk factors: ***T2DM ***with neuropathy, ***nicotine use, ***spinal stenosis, ***prior stroke, ***dementia.  - Patient's exacerbating factors include: ***diuretic use, ***caffeine intake, ***glucosuria (due to ***Jardiance / ***Farxiga use), ***ambulatory dysfunction (functional incontinence).   We discussed the following management options in detail including potential benefits, risks, and side effects: Behavioral therapy: Modify fluid intake Decreasing bladder irritants (such as caffeine) Bladder retraining / timed voiding Double voiding ***Consider discussing possible alternatives to ***Jardiance Marcelline Deist with prescribing provider. This medication causes  excess sugar to be excreted into the urine. That can prompt the kidneys to put out more water to dilute that sugar in the urine and it can also irritate the bladder lining, both of which may contribute to OAB symptoms (urinary frequency, urgency, and urge incontinence). Medication(s): - Anticholinergic medications: ***We discussed potential side effects of anticholinergic medications such as urinary retention, dry eyes, dry mouth, constipation, confusion, cognitive impairment / dementia.  ***Not a safe candidate for anticholinergic medications due to risk for side effects based on patient's age, comorbidities, and pre-existing ***dry mouth ***dry eyes ***constipation ***Parkinsons disease ***MS.  - Beta-3 agonist medications: We discussed potential side effects of beta-3 agonist medications such as urinary retention and (infrequently) elevated blood pressure.  - Combination therapy with anticholinergic medication + beta-3 agonist medication. 3. For refractory cases: PTNS (posterior tibial  nerve stimulation) ***Not a safe candidate for PTNS due to ***bleeding disorder, ***anticoagulant use, ***pregnancy, ***pacemaker, ***implanted cardiac defibrillator (ICD), ***neuropathy / nerve damage / nerve conduction disorder, ***lower extremity metal implant(s).  Sacral neuromodulation trial (Medtronic lnterStim or Axonics implant) Bladder Botox injections  He decided to proceed with *** ***work on behavioral modifications including ***minimizing caffeine intake and working on ***timed voiding / bladder retraining.  Will plan for follow up in *** weeks / *** months or sooner if needed. Pt verbalized understanding and agreement. All questions were answered.  PLAN Advised the following: *** ***. Minimize caffeine intake. ***. Work on timed voiding / bladder retraining. ***. Urge suppression strategies. ***. Double/ triple voiding. ***. No follow-ups on file.  No orders of the defined types were placed in this encounter.   It has been explained that the patient is to follow regularly with their PCP in addition to all other providers involved in their care and to follow instructions provided by these respective offices. Patient advised to contact urology clinic if any urologic-pertaining questions, concerns, new symptoms or problems arise in the interim period.  There are no Patient Instructions on file for this visit.  Electronically signed by:  Donnita Falls, FNP   07/14/23    1:40 PM

## 2023-07-15 ENCOUNTER — Ambulatory Visit: Payer: 59 | Admitting: Urology

## 2023-07-15 DIAGNOSIS — N3281 Overactive bladder: Secondary | ICD-10-CM

## 2023-07-15 DIAGNOSIS — N3941 Urge incontinence: Secondary | ICD-10-CM

## 2023-07-15 DIAGNOSIS — N400 Enlarged prostate without lower urinary tract symptoms: Secondary | ICD-10-CM

## 2023-07-19 ENCOUNTER — Ambulatory Visit (INDEPENDENT_AMBULATORY_CARE_PROVIDER_SITE_OTHER): Payer: 59

## 2023-07-19 DIAGNOSIS — E538 Deficiency of other specified B group vitamins: Secondary | ICD-10-CM | POA: Diagnosis not present

## 2023-07-19 NOTE — Progress Notes (Signed)
 Patient is in office today for a nurse visit for B12 Injection. Patient Injection was given in the  Left deltoid. Patient tolerated injection well.

## 2023-08-16 ENCOUNTER — Ambulatory Visit (INDEPENDENT_AMBULATORY_CARE_PROVIDER_SITE_OTHER)

## 2023-08-16 DIAGNOSIS — E538 Deficiency of other specified B group vitamins: Secondary | ICD-10-CM

## 2023-08-16 NOTE — Progress Notes (Signed)
 Patient is in office today for a nurse visit for B12 Injection. Patient Injection was given in the  Right deltoid. Patient tolerated injection well.

## 2023-08-20 ENCOUNTER — Ambulatory Visit

## 2023-09-14 ENCOUNTER — Ambulatory Visit: Payer: 59 | Admitting: Family Medicine

## 2023-09-16 ENCOUNTER — Ambulatory Visit (INDEPENDENT_AMBULATORY_CARE_PROVIDER_SITE_OTHER)

## 2023-09-16 ENCOUNTER — Encounter: Payer: Self-pay | Admitting: Family Medicine

## 2023-09-16 DIAGNOSIS — E538 Deficiency of other specified B group vitamins: Secondary | ICD-10-CM | POA: Diagnosis not present

## 2023-09-16 NOTE — Progress Notes (Signed)
 Patient is in office today for a nurse visit for B12 Injection. Patient Injection was given in the  Left deltoid. Patient tolerated injection well.

## 2023-09-20 ENCOUNTER — Encounter: Payer: Self-pay | Admitting: Acute Care

## 2023-10-06 ENCOUNTER — Other Ambulatory Visit: Payer: Self-pay | Admitting: Family Medicine

## 2023-10-06 DIAGNOSIS — F3341 Major depressive disorder, recurrent, in partial remission: Secondary | ICD-10-CM

## 2023-10-06 DIAGNOSIS — I209 Angina pectoris, unspecified: Secondary | ICD-10-CM

## 2023-10-06 DIAGNOSIS — I10 Essential (primary) hypertension: Secondary | ICD-10-CM

## 2023-10-06 DIAGNOSIS — I25118 Atherosclerotic heart disease of native coronary artery with other forms of angina pectoris: Secondary | ICD-10-CM

## 2023-10-06 DIAGNOSIS — G4701 Insomnia due to medical condition: Secondary | ICD-10-CM

## 2023-10-18 ENCOUNTER — Ambulatory Visit (INDEPENDENT_AMBULATORY_CARE_PROVIDER_SITE_OTHER)

## 2023-10-18 DIAGNOSIS — E538 Deficiency of other specified B group vitamins: Secondary | ICD-10-CM

## 2023-10-18 NOTE — Progress Notes (Signed)
 Patient is in office today for a nurse visit for B12 Injection. Patient Injection was given in the  Right deltoid. Patient tolerated injection well.

## 2023-10-19 ENCOUNTER — Ambulatory Visit: Payer: Self-pay | Admitting: Family Medicine

## 2023-10-19 LAB — VITAMIN B12: Vitamin B-12: 576 pg/mL (ref 232–1245)

## 2023-10-19 NOTE — Progress Notes (Signed)
Hello Amadou,  Your lab result is normal and/or stable.Some minor variations that are not significant are commonly marked abnormal, but do not represent any medical problem for you.  Best regards, Virna Livengood, M.D.

## 2023-11-18 ENCOUNTER — Other Ambulatory Visit: Payer: Self-pay | Admitting: Family Medicine

## 2023-11-18 ENCOUNTER — Ambulatory Visit (INDEPENDENT_AMBULATORY_CARE_PROVIDER_SITE_OTHER)

## 2023-11-18 DIAGNOSIS — E538 Deficiency of other specified B group vitamins: Secondary | ICD-10-CM | POA: Diagnosis not present

## 2023-11-18 DIAGNOSIS — N401 Enlarged prostate with lower urinary tract symptoms: Secondary | ICD-10-CM

## 2023-11-18 DIAGNOSIS — G4701 Insomnia due to medical condition: Secondary | ICD-10-CM

## 2023-11-18 DIAGNOSIS — I25118 Atherosclerotic heart disease of native coronary artery with other forms of angina pectoris: Secondary | ICD-10-CM

## 2023-11-18 DIAGNOSIS — K219 Gastro-esophageal reflux disease without esophagitis: Secondary | ICD-10-CM

## 2023-11-18 DIAGNOSIS — F411 Generalized anxiety disorder: Secondary | ICD-10-CM

## 2023-11-18 DIAGNOSIS — F3341 Major depressive disorder, recurrent, in partial remission: Secondary | ICD-10-CM

## 2023-11-18 DIAGNOSIS — I10 Essential (primary) hypertension: Secondary | ICD-10-CM

## 2023-11-18 DIAGNOSIS — E785 Hyperlipidemia, unspecified: Secondary | ICD-10-CM

## 2023-11-18 DIAGNOSIS — I209 Angina pectoris, unspecified: Secondary | ICD-10-CM

## 2023-11-18 NOTE — Progress Notes (Signed)
 Patient is in office today for a nurse visit for B12 Injection. Patient Injection was given in the  Left deltoid. Patient tolerated injection well.

## 2023-11-23 ENCOUNTER — Telehealth: Admitting: Family Medicine

## 2023-11-23 DIAGNOSIS — R519 Headache, unspecified: Secondary | ICD-10-CM

## 2023-11-23 DIAGNOSIS — L539 Erythematous condition, unspecified: Secondary | ICD-10-CM | POA: Diagnosis not present

## 2023-11-23 DIAGNOSIS — S1086XA Insect bite of other specified part of neck, initial encounter: Secondary | ICD-10-CM

## 2023-11-23 DIAGNOSIS — W57XXXA Bitten or stung by nonvenomous insect and other nonvenomous arthropods, initial encounter: Secondary | ICD-10-CM | POA: Diagnosis not present

## 2023-11-23 MED ORDER — DOXYCYCLINE HYCLATE 100 MG PO TABS: 100.0000 mg | ORAL_TABLET | Freq: Two times a day (BID) | ORAL | 0 refills | Status: AC

## 2023-11-23 NOTE — Progress Notes (Signed)
 Message sent to patient requesting further input regarding current symptoms. Awaiting patient response.

## 2023-11-23 NOTE — Progress Notes (Signed)
 E-Visit for Tick Bite  Thank you for describing your tick bite, Here is how we plan to help! Based on the information that you shared with me it looks like you have An infected or complicated tick bite that requires a longer course of antibiotics and will need for you to schedule a follow-up visit with a provider. PLEASE GET A FOLLOW UP APPT WITH YOUR PCP. You might need labs and or additional test or medication.  In most cases a tick bite is painless and does not itch.  Most tick bites in which the tick is quickly removed do not require prescriptions. Ticks can transmit several diseases if they are infected and remain attacked to your skin. Therefore the length that the tick was attached and any symptoms you have experienced after the bite are import to accurately develop your custom treatment plan. In most cases a single dose of doxycycline  may prevent the development of a more serious condition.  Based on your information I have Provided a home care guide for tick bites and  instructions on when to call for help. and Your symptoms indicate that you need a longer course of antibiotics and a follow up visit with a provider. I have sent doxycycline  100 mg twice a day for 21 days to the pharmacy that you selected. You will need to schedule a follow up visit with your provider. If you do not have a primary care provider you may use our telehealth physicians on the web at [facility]  Which ticks  are associated with illness?  The Wood Tick (dog tick) is the size of a watermelon seed and can sometimes transmit [facility] spotted fever and [place]  tick fever.   The Deer Tick (black-legged tick) is between the size of a poppy seed (pin head) and an apple seed, and can sometimes transmit Lyme disease.  A brown to black tick with a white splotch on its back is likely a male Amblyomma americanum ([place] tick). This tick has been associated with Southern Tick Associated illness (  STARI)  Lyme disease has become the most common tick-borne illness in the [place] . The risk of Lyme disease following a recognized deer tick bite is estimated to be 1%.  The majority of cases of Lyme disease start with a bull's eye rash at the site of the tick bite. The rash can occur days to weeks (typically 7-10 days) after a tick bite. Treatment with antibiotics is indicated if this rash appears. Flu-like symptoms may accompany the rash, including: fever, chills, headaches, muscle aches, and fatigue. Removing ticks promptly may prevent tick borne disease.  What can be used to prevent Tick Bites?  Insect repellant with at leas 20% DEET. Wearing long pants with sock and shoes. Avoiding tall grass and heavily wooded areas. Checking your skin after being outdoors. Shower with a washcloth after outdoor exposures.  HOME CARE ADVICE FOR TICK BITE  Wood Tick Removal:  Use a pair of tweezers and grasp the wood tick close to the skin (on its head). Pull the wood tick straight upward without twisting or crushing it. Maintain a steady pressure until it releases its grip.   If tweezers aren't available, use fingers, a loop of thread around the jaws, or a needle between the jaws for traction.  Note: covering the tick with petroleum jelly, nail polish or rubbing alcohol doesn't work. Neither does touching the tick with a hot or cold object. Tiny Deer Tick Removal:   Needs to be  scraped off with a knife blade or credit card edge. Place tick in a sealed container (e.g. glass jar, zip lock plastic bag), in case your doctor wants to see it. Tick's Head Removal:  If the wood tick's head breaks off in the skin, it must be removed. Clean the skin. Then use a sterile needle to uncover the head and lift it out or scrape it off.  If a very small piece of the head remains, the skin will eventually slough it off. Antibiotic Ointment:  Wash the wound and your hands with soap and water after removal to  prevent catching any tick disease.  Apply an over the counter antibiotic ointment (e.g. bacitracin) to the bite once. Expected Course: Tick bites normally don't itch or hurt. That's why they often go unnoticed. Call Your Doctor If:  You can't remove the tick or the tick's head Fever, a severe head ache, or rash occur in the next 2 weeks Bite begins to look infected Lyme's disease is common in your area You have not had a tetanus in the last 10 years Your current symptoms become worse    MAKE SURE YOU  Understand these instructions. Will watch your condition. Will get help right away if you are not doing well or get worse.    Thank you for choosing an e-visit.  Your e-visit answers were reviewed by a board certified advanced clinical practitioner to complete your personal care plan. Depending upon the condition, your plan could have included both over the counter or prescription medications.  Please review your pharmacy choice. Make sure the pharmacy is open so you can pick up prescription now. If there is a problem, you may contact your provider through [employer] and have the prescription routed to another pharmacy.  Your safety is important to us . If you have drug allergies check your prescription carefully.   For the next 24 hours you can use MyChart to ask questions about today's visit, request a non-urgent call back, or ask for a work or school excuse. You will get an email in the next two days asking about your experience. I hope that your e-visit has been valuable and will speed your recovery.   I provided 5 minutes of non face-to-face time during this encounter for chart review, medication and order placement, as well as and documentation.

## 2023-12-17 ENCOUNTER — Other Ambulatory Visit: Payer: Self-pay | Admitting: Family Medicine

## 2023-12-17 DIAGNOSIS — E785 Hyperlipidemia, unspecified: Secondary | ICD-10-CM

## 2023-12-17 DIAGNOSIS — I25118 Atherosclerotic heart disease of native coronary artery with other forms of angina pectoris: Secondary | ICD-10-CM

## 2023-12-17 DIAGNOSIS — K219 Gastro-esophageal reflux disease without esophagitis: Secondary | ICD-10-CM

## 2023-12-17 DIAGNOSIS — I209 Angina pectoris, unspecified: Secondary | ICD-10-CM

## 2023-12-17 DIAGNOSIS — F411 Generalized anxiety disorder: Secondary | ICD-10-CM

## 2023-12-17 DIAGNOSIS — G4701 Insomnia due to medical condition: Secondary | ICD-10-CM

## 2023-12-17 DIAGNOSIS — N401 Enlarged prostate with lower urinary tract symptoms: Secondary | ICD-10-CM

## 2023-12-17 DIAGNOSIS — I10 Essential (primary) hypertension: Secondary | ICD-10-CM

## 2023-12-17 DIAGNOSIS — F3341 Major depressive disorder, recurrent, in partial remission: Secondary | ICD-10-CM

## 2023-12-17 NOTE — Telephone Encounter (Signed)
 Refills refused, 30 day supply was given 11/18/23. Please schedule pt with PCP.

## 2023-12-21 ENCOUNTER — Ambulatory Visit

## 2023-12-21 NOTE — Telephone Encounter (Signed)
 Appt scheduled for 12/22/23

## 2023-12-22 ENCOUNTER — Ambulatory Visit: Admitting: Family Medicine

## 2023-12-24 ENCOUNTER — Other Ambulatory Visit: Payer: Self-pay | Admitting: Family Medicine

## 2023-12-24 DIAGNOSIS — I209 Angina pectoris, unspecified: Secondary | ICD-10-CM

## 2023-12-24 DIAGNOSIS — E785 Hyperlipidemia, unspecified: Secondary | ICD-10-CM

## 2023-12-24 DIAGNOSIS — I10 Essential (primary) hypertension: Secondary | ICD-10-CM

## 2023-12-24 DIAGNOSIS — F411 Generalized anxiety disorder: Secondary | ICD-10-CM

## 2023-12-24 DIAGNOSIS — G4701 Insomnia due to medical condition: Secondary | ICD-10-CM

## 2023-12-24 DIAGNOSIS — I25118 Atherosclerotic heart disease of native coronary artery with other forms of angina pectoris: Secondary | ICD-10-CM

## 2023-12-24 DIAGNOSIS — K219 Gastro-esophageal reflux disease without esophagitis: Secondary | ICD-10-CM

## 2023-12-24 DIAGNOSIS — F3341 Major depressive disorder, recurrent, in partial remission: Secondary | ICD-10-CM

## 2023-12-24 MED ORDER — DULOXETINE HCL 60 MG PO CPEP: 60.0000 mg | ORAL_CAPSULE | Freq: Every day | ORAL | 0 refills | Status: DC

## 2023-12-24 MED ORDER — METOPROLOL TARTRATE 25 MG PO TABS
25.0000 mg | ORAL_TABLET | Freq: Two times a day (BID) | ORAL | 0 refills | Status: DC
Start: 2023-12-24 — End: 2024-01-13

## 2023-12-24 MED ORDER — ISOSORBIDE MONONITRATE ER 60 MG PO TB24: 60.0000 mg | ORAL_TABLET | Freq: Every day | ORAL | 0 refills | Status: DC

## 2023-12-24 MED ORDER — PANTOPRAZOLE SODIUM 40 MG PO TBEC: 40.0000 mg | DELAYED_RELEASE_TABLET | Freq: Two times a day (BID) | ORAL | 0 refills | Status: DC

## 2023-12-24 MED ORDER — MIRTAZAPINE 15 MG PO TABS: 15.0000 mg | ORAL_TABLET | Freq: Every evening | ORAL | 0 refills | Status: DC | PRN

## 2023-12-24 MED ORDER — ATORVASTATIN CALCIUM 40 MG PO TABS: ORAL_TABLET | ORAL | 0 refills | Status: DC

## 2023-12-24 NOTE — Telephone Encounter (Signed)
 Copied from CRM [id number]. Topic: Clinical - Medication Refill >> Dec 24, 2023  9:05 AM Myrick T wrote: Medication: pantoprazole  (PROTONIX ) 40 MG tablet, metoprolol  tartrate (LOPRESSOR ) 25 MG tablet, isosorbide  mononitrate (IMDUR ) 60 MG 24 hr tablet, atorvastatin  (LIPITOR) 40 MG tablet, DULoxetine  (CYMBALTA ) 60 MG capsule and mirtazapine  (REMERON ) 15 MG tablet  Has the patient contacted their pharmacy? No  This is the patient's preferred pharmacy:  [facility] [place], [place] - [address] [place] [address] Phone: [phone] Fax: [phone]  Is this the correct pharmacy for this prescription? Yes  Has the prescription been filled recently? Yes  Is the patient out of the medication? No  Has the patient been seen for an appointment in the last year OR does the patient have an upcoming appointment? Yes  Can we respond through MyChart? Yes  Agent: Please be advised that Rx refills may take up to 3 business days. We ask that you follow-up with your pharmacy.

## 2023-12-26 NOTE — Progress Notes (Deleted)
  Cardiology Office Note:   Date:  12/26/2023  ID:  [name], DOB [birth year], MRN [id number] PCP: Zollie Lowers, MD  [name] Providers Cardiologist:  Lynwood Schilling, MD {  History of Present Illness:   [name] is a 68 y.o. male who presents for follow up of CAD.  The patient has a history of CAD. He has has had a remote LHC that showed 30% LAD and 70% diagonal lesion. He has reported having stents in the past, although this was felt to be possibly incorrect per prior cardiology evaluation.  He had chest pain and had cath in 2019.  The patient underwent LHC that showed a 70% LAD stenosis with other nonobstructive CAD and normal LV gram; no culprit lesion was identified.  He was managed medically.     Since I last saw him ***   ***  he is complaining mostly of his legs giving out from underneath him.  He says this happens when he walks and sometimes has fallen.  Not really describing pain although he is somewhat vague about this.  He says he does get some chest discomfort sometimes before this or might have it afterwards.  He is not describing any neck or arm discomfort.  He is not getting any new shortness of breath, PND or orthopnea.  He is not describing palpitations, presyncope or syncope.  At the last visit I was going to order a sleep study on him because he has such significant fatigue but he did not have this done.  He wants to inquire about a home sleep study.  ROS: ***  Studies Reviewed:    EKG:       ***  Risk Assessment/Calculations:   {Does this patient have ATRIAL FIBRILLATION?:[id number]} No BP recorded.  {Refresh Note OR Click here to enter BP  :1}***        Physical Exam:   VS:  There were no vitals taken for this visit.   Wt Readings from Last 3 Encounters:  06/17/23 248 lb 12.8 oz (112.9 kg)  03/16/23 242 lb 12.8 oz (110.1 kg)  03/01/23 245 lb (111.1 kg)     GEN: Well nourished, well developed in no acute distress NECK: No JVD; No  carotid bruits CARDIAC: ***RR, *** murmurs, rubs, gallops RESPIRATORY:  Clear to auscultation without rales, wheezing or rhonchi  ABDOMEN: Soft, non-tender, non-distended EXTREMITIES:  No edema; No deformity   ASSESSMENT AND PLAN:   CAD: ***  He is describing some chest discomfort and some other vague symptoms.  He has known moderate LAD disease.  I think it is prudent to screen him with a Lexiscan  Myoview.   HTN: His blood pressure is ***  at target.  No change in therapy.   DYSLIPIDEMIA:   LDL was *** 34 with an HDL of 25.  No change in therapy.    TOBACCO ABUSE: ***  We had a conversation about the need to stop smoking again.   FATIGUE:  ***   He says he would not want to have an in lab sleep study so I will see if I can arrange a home sleep study.  Stop bang is 5.         Follow up ***  Signed, Lynwood Schilling, MD

## 2023-12-29 ENCOUNTER — Ambulatory Visit: Admitting: Cardiology

## 2023-12-29 DIAGNOSIS — E785 Hyperlipidemia, unspecified: Secondary | ICD-10-CM

## 2023-12-29 DIAGNOSIS — I25118 Atherosclerotic heart disease of native coronary artery with other forms of angina pectoris: Secondary | ICD-10-CM

## 2023-12-29 DIAGNOSIS — I10 Essential (primary) hypertension: Secondary | ICD-10-CM

## 2023-12-30 ENCOUNTER — Other Ambulatory Visit: Payer: Self-pay | Admitting: Family Medicine

## 2023-12-30 DIAGNOSIS — F3341 Major depressive disorder, recurrent, in partial remission: Secondary | ICD-10-CM

## 2023-12-30 DIAGNOSIS — N401 Enlarged prostate with lower urinary tract symptoms: Secondary | ICD-10-CM

## 2024-01-06 ENCOUNTER — Ambulatory Visit: Admitting: Family Medicine

## 2024-01-12 ENCOUNTER — Encounter: Payer: Self-pay | Admitting: Family Medicine

## 2024-01-12 ENCOUNTER — Other Ambulatory Visit: Payer: Self-pay | Admitting: Family Medicine

## 2024-01-12 DIAGNOSIS — F411 Generalized anxiety disorder: Secondary | ICD-10-CM

## 2024-01-12 DIAGNOSIS — I209 Angina pectoris, unspecified: Secondary | ICD-10-CM

## 2024-01-12 DIAGNOSIS — E785 Hyperlipidemia, unspecified: Secondary | ICD-10-CM

## 2024-01-12 DIAGNOSIS — N401 Enlarged prostate with lower urinary tract symptoms: Secondary | ICD-10-CM

## 2024-01-12 DIAGNOSIS — F3341 Major depressive disorder, recurrent, in partial remission: Secondary | ICD-10-CM

## 2024-01-12 DIAGNOSIS — I25118 Atherosclerotic heart disease of native coronary artery with other forms of angina pectoris: Secondary | ICD-10-CM

## 2024-01-12 DIAGNOSIS — I10 Essential (primary) hypertension: Secondary | ICD-10-CM

## 2024-01-12 DIAGNOSIS — K219 Gastro-esophageal reflux disease without esophagitis: Secondary | ICD-10-CM

## 2024-01-12 DIAGNOSIS — G4701 Insomnia due to medical condition: Secondary | ICD-10-CM

## 2024-01-18 ENCOUNTER — Ambulatory Visit (INDEPENDENT_AMBULATORY_CARE_PROVIDER_SITE_OTHER): Admitting: Family Medicine

## 2024-01-18 ENCOUNTER — Encounter: Payer: Self-pay | Admitting: Family Medicine

## 2024-01-18 VITALS — BP 116/72 | HR 67 | Temp 98.2°F | Ht 72.0 in | Wt 242.0 lb

## 2024-01-18 DIAGNOSIS — Z23 Encounter for immunization: Secondary | ICD-10-CM

## 2024-01-18 DIAGNOSIS — J431 Panlobular emphysema: Secondary | ICD-10-CM | POA: Diagnosis not present

## 2024-01-18 DIAGNOSIS — W57XXXA Bitten or stung by nonvenomous insect and other nonvenomous arthropods, initial encounter: Secondary | ICD-10-CM

## 2024-01-18 DIAGNOSIS — I25118 Atherosclerotic heart disease of native coronary artery with other forms of angina pectoris: Secondary | ICD-10-CM

## 2024-01-18 DIAGNOSIS — F3341 Major depressive disorder, recurrent, in partial remission: Secondary | ICD-10-CM

## 2024-01-18 DIAGNOSIS — J432 Centrilobular emphysema: Secondary | ICD-10-CM

## 2024-01-18 DIAGNOSIS — F411 Generalized anxiety disorder: Secondary | ICD-10-CM

## 2024-01-18 DIAGNOSIS — G4701 Insomnia due to medical condition: Secondary | ICD-10-CM

## 2024-01-18 DIAGNOSIS — E538 Deficiency of other specified B group vitamins: Secondary | ICD-10-CM | POA: Diagnosis not present

## 2024-01-18 DIAGNOSIS — K219 Gastro-esophageal reflux disease without esophagitis: Secondary | ICD-10-CM

## 2024-01-18 DIAGNOSIS — I10 Essential (primary) hypertension: Secondary | ICD-10-CM

## 2024-01-18 DIAGNOSIS — N401 Enlarged prostate with lower urinary tract symptoms: Secondary | ICD-10-CM

## 2024-01-18 DIAGNOSIS — I209 Angina pectoris, unspecified: Secondary | ICD-10-CM

## 2024-01-18 DIAGNOSIS — E785 Hyperlipidemia, unspecified: Secondary | ICD-10-CM

## 2024-01-18 LAB — URINALYSIS
Bilirubin, UA: NEGATIVE
Glucose, UA: NEGATIVE
Ketones, UA: NEGATIVE
Leukocytes,UA: NEGATIVE
Nitrite, UA: NEGATIVE
Specific Gravity, UA: 1.015 (ref 1.005–1.030)
Urobilinogen, Ur: 4 mg/dL — ABNORMAL HIGH (ref 0.2–1.0)
pH, UA: 6 (ref 5.0–7.5)

## 2024-01-18 LAB — LIPID PANEL

## 2024-01-18 MED ORDER — BREZTRI AEROSPHERE 160-9-4.8 MCG/ACT IN AERO: 2.0000 | INHALATION_SPRAY | Freq: Two times a day (BID) | RESPIRATORY_TRACT | 11 refills | Status: AC

## 2024-01-18 NOTE — Progress Notes (Signed)
 Subjective:  Patient ID: [name], male    DOB: 1955/12/25  Age: 68 y.o. MRN: [id number]  CC: Medical Management of Chronic Issues   HPI  Discussed the use of AI scribe software for clinical note transcription with the patient, who gave verbal consent to proceed.  History of Present Illness [name] is a 68 year old male who presents for follow-up on his respiratory condition.  He experiences episodes of near falls, particularly in the shower, and occasional stumbling when at home alone. He attributes some of these issues to a lack of exercise, as noted by his sister. He uses a quad cane for stability when walking to the mailbox and has an aid at home who assists him with reminders to exercise. He has not experienced any recent falls but acknowledges the need for increased physical activity.  He has a history of breathing difficulties and uses inhalers daily. He uses albuterol  as needed for shortness of breath and has been prescribed Trelegy, although he feels he is not receiving the medication effectively. He experiences wheezing when walking and shortness of breath with activities such as raking the yard. He denies recent chest pain or heart palpitations.  His current medications include atorvastatin , bupropion , and Cymbalta  (duloxetine ). He reports no recent episodes of depression or anxiety and states his mood is stable. No issues with heartburn, swelling in the feet and ankles, or stomach problems.          01/18/2024    8:30 AM 03/16/2023   10:25 AM 03/01/2023   11:30 AM  Depression screen PHQ 2/9  Decreased Interest 0 0 0  Down, Depressed, Hopeless 0 0 0  PHQ - 2 Score 0 0 0    History [name] has a past medical history of Cerebral palsy (HCC), Coronary artery disease, Depression, GERD (gastroesophageal reflux disease), Hypertension, Scoliosis, and Seizures (HCC).   He has a past surgical history that includes Coronary angioplasty with stent; Carpal  tunnel release (Right, 08/13/2016); and LEFT HEART CATH AND CORONARY ANGIOGRAPHY (N/A, 07/10/2017).   His family history includes Alcohol abuse in his brother; CAD in his brother and sister; CAD (age of onset: 68) in his father; Diabetes in his mother; Heart disease in his father; Heart failure in his sister.He reports that he has been smoking cigarettes. He started smoking about 46 years ago. He has a 20.5 pack-year smoking history. He has never used smokeless tobacco. He reports current alcohol use. He reports that he does not use drugs.    ROS Review of Systems  Constitutional:  Negative for fever.  Respiratory:  Negative for shortness of breath.   Cardiovascular:  Negative for chest pain.  Musculoskeletal:  Negative for arthralgias.  Skin:  Negative for rash.    Objective:  BP 116/72   Pulse 67   Temp 98.2 F (36.8 C)   Ht 6' (1.829 m)   Wt 242 lb (109.8 kg)   SpO2 96%   BMI 32.82 kg/m   BP Readings from Last 3 Encounters:  01/18/24 116/72  06/17/23 128/66  04/06/23 114/73    Wt Readings from Last 3 Encounters:  01/18/24 242 lb (109.8 kg)  06/17/23 248 lb 12.8 oz (112.9 kg)  03/16/23 242 lb 12.8 oz (110.1 kg)     Physical Exam Physical Exam GENERAL: Alert, cooperative, well developed, no acute distress HEENT: Normocephalic, normal oropharynx, moist mucous membranes CHEST: Clear to auscultation bilaterally, No wheezes, rhonchi, or crackles CARDIOVASCULAR: Normal heart rate and rhythm,  S1 and S2 normal without murmurs ABDOMEN: Soft, non-tender, non-distended, without organomegaly, Normal bowel sounds EXTREMITIES: No cyanosis or edema NEUROLOGICAL: Cranial nerves grossly intact, Moves all extremities left UE weak, decreased use of Hand due to CP   Assessment & Plan:  Encounter for immunization -     Flu vaccine HIGH DOSE PF(Fluzone Trivalent)  Centrilobular emphysema (HCC) -     CBC with Differential/Platelet -     CMP14+EGFR  B12 deficiency -     CBC with  Differential/Platelet -     CMP14+EGFR  Bug bite, initial encounter -     CBC with Differential/Platelet -     CMP14+EGFR  Coronary artery disease of native artery of native heart with stable angina pectoris -     CBC with Differential/Platelet -     CMP14+EGFR  Dyslipidemia, goal LDL below 70 -     CBC with Differential/Platelet -     CMP14+EGFR -     Lipid panel  Recurrent major depressive disorder, in partial remission -     CBC with Differential/Platelet -     CMP14+EGFR  GAD (generalized anxiety disorder) -     CBC with Differential/Platelet -     CMP14+EGFR  Panlobular emphysema (HCC) -     CBC with Differential/Platelet -     CMP14+EGFR  Angina pectoris -     CBC with Differential/Platelet -     CMP14+EGFR  Essential hypertension -     CBC with Differential/Platelet -     CMP14+EGFR -     Urinalysis  Insomnia due to medical condition -     CBC with Differential/Platelet -     CMP14+EGFR  Gastroesophageal reflux disease, unspecified whether esophagitis present -     CBC with Differential/Platelet -     CMP14+EGFR  Benign prostatic hyperplasia with nocturia -     CBC with Differential/Platelet -     CMP14+EGFR -     PSA, total and free -     Urinalysis  Other orders -     [employer]; Inhale 2 puffs into the lungs in the morning and at bedtime.  Dispense: 10.7 g; Refill: 11    Assessment and Plan Assessment & Plan Gait instability with risk of falls   He experiences gait instability, particularly when getting out of the shower and walking around the house, despite using a quad cane and having near falls. Encourage walking to the mailbox and back five times a day, gradually increasing activity. Advise using a walker for additional stability, especially on uneven surfaces. Instruct home aid to assist with walking to ensure safety and provide balance support.  Chronic obstructive pulmonary disease (COPD)   He has COPD with episodes of wheezing  and dyspnea, especially during physical activity. Currently using albuterol  as needed and Trelegy, but reports not feeling the medication's effect. Transitioning to Breztri for better symptom management. Prescribe Breztri, two puffs in the morning and two puffs in the evening daily. Send prescription to [facility]. Schedule a medication review to ensure proper use and effectiveness of inhalers.  Depression   Depression is well-managed with bupropion  and duloxetine . He reports no recent episodes of depression or anxiety.  General Health Maintenance   Blood pressure is well controlled. No recent issues with heart palpitations, chest pain, or peripheral edema. No problems with heartburn as he avoids spicy foods.  Follow-up   Schedule follow-up appointment for early December, post-Thanksgiving.       Follow-up: Return in about 2  months (around 03/22/2024).  Butler Der, M.D.

## 2024-01-19 LAB — CMP14+EGFR
ALT: 11 IU/L (ref 0–44)
AST: 13 IU/L (ref 0–40)
Albumin: 4.3 g/dL (ref 3.9–4.9)
Alkaline Phosphatase: 105 IU/L (ref 47–123)
BUN/Creatinine Ratio: 10 (ref 10–24)
BUN: 7 mg/dL — AB (ref 8–27)
Bilirubin Total: 1.1 mg/dL (ref 0.0–1.2)
CO2: 30 mmol/L — AB (ref 20–29)
Calcium: 9.2 mg/dL (ref 8.6–10.2)
Chloride: 93 mmol/L — AB (ref 96–106)
Creatinine, Ser: 0.69 mg/dL — AB (ref 0.76–1.27)
Globulin, Total: 2.9 g/dL (ref 1.5–4.5)
Glucose: 84 mg/dL (ref 70–99)
Potassium: 3.7 mmol/L (ref 3.5–5.2)
Sodium: 139 mmol/L (ref 134–144)
Total Protein: 7.2 g/dL (ref 6.0–8.5)
eGFR: 103 mL/min/1.73 (ref >59)

## 2024-01-19 LAB — CBC WITH DIFFERENTIAL/PLATELET
Basophils Absolute: 0.1 x10E3/uL (ref 0.0–0.2)
Basos: 1 %
EOS (ABSOLUTE): 0.8 x10E3/uL — ABNORMAL HIGH (ref 0.0–0.4)
Eos: 8 %
Hematocrit: 42.1 % (ref 37.5–51.0)
Hemoglobin: 14.4 g/dL (ref 13.0–17.7)
Immature Grans (Abs): 0 x10E3/uL (ref 0.0–0.1)
Immature Granulocytes: 0 %
Lymphocytes Absolute: 3.4 x10E3/uL — ABNORMAL HIGH (ref 0.7–3.1)
Lymphs: 35 %
MCH: 32.5 pg (ref 26.6–33.0)
MCHC: 34.2 g/dL (ref 31.5–35.7)
MCV: 95 fL (ref 79–97)
Monocytes Absolute: 0.4 x10E3/uL (ref 0.1–0.9)
Monocytes: 4 %
Neutrophils Absolute: 5.1 x10E3/uL (ref 1.4–7.0)
Neutrophils: 52 %
Platelets: 219 x10E3/uL (ref 150–450)
RBC: 4.43 x10E6/uL (ref 4.14–5.80)
RDW: 12.4 % (ref 11.6–15.4)
WBC: 9.8 x10E3/uL (ref 3.4–10.8)

## 2024-01-19 LAB — PSA, TOTAL AND FREE
PSA, Free Pct: 100 %
PSA, Free: 0.1 ng/mL
Prostate Specific Ag, Serum: 0.1 ng/mL (ref 0.0–4.0)

## 2024-01-19 LAB — LIPID PANEL
Cholesterol, Total: 101 mg/dL (ref 100–199)
HDL: 22 mg/dL — AB (ref >39)
LDL CALC COMMENT:: 4.6 ratio (ref 0.0–5.0)
LDL Chol Calc (NIH): 60 mg/dL (ref 0–99)
Triglycerides: 98 mg/dL (ref 0–149)
VLDL Cholesterol Cal: 19 mg/dL (ref 5–40)

## 2024-01-23 ENCOUNTER — Ambulatory Visit: Payer: Self-pay | Admitting: Family Medicine

## 2024-01-23 NOTE — Progress Notes (Signed)
Hello Amadou,  Your lab result is normal and/or stable.Some minor variations that are not significant are commonly marked abnormal, but do not represent any medical problem for you.  Best regards, Virna Livengood, M.D.

## 2024-02-17 ENCOUNTER — Other Ambulatory Visit: Payer: Self-pay | Admitting: Family Medicine

## 2024-02-17 ENCOUNTER — Ambulatory Visit

## 2024-02-17 DIAGNOSIS — I10 Essential (primary) hypertension: Secondary | ICD-10-CM

## 2024-02-17 DIAGNOSIS — I25118 Atherosclerotic heart disease of native coronary artery with other forms of angina pectoris: Secondary | ICD-10-CM

## 2024-02-17 DIAGNOSIS — G4701 Insomnia due to medical condition: Secondary | ICD-10-CM

## 2024-02-17 DIAGNOSIS — E785 Hyperlipidemia, unspecified: Secondary | ICD-10-CM

## 2024-02-17 DIAGNOSIS — F411 Generalized anxiety disorder: Secondary | ICD-10-CM

## 2024-02-17 DIAGNOSIS — K219 Gastro-esophageal reflux disease without esophagitis: Secondary | ICD-10-CM

## 2024-02-17 DIAGNOSIS — F3341 Major depressive disorder, recurrent, in partial remission: Secondary | ICD-10-CM

## 2024-02-17 DIAGNOSIS — N401 Enlarged prostate with lower urinary tract symptoms: Secondary | ICD-10-CM

## 2024-02-17 DIAGNOSIS — I209 Angina pectoris, unspecified: Secondary | ICD-10-CM

## 2024-03-01 ENCOUNTER — Ambulatory Visit (INDEPENDENT_AMBULATORY_CARE_PROVIDER_SITE_OTHER): Payer: 59

## 2024-03-01 VITALS — BP 116/72 | HR 67 | Ht 73.0 in | Wt 242.0 lb

## 2024-03-01 DIAGNOSIS — Z0000 Encounter for general adult medical examination without abnormal findings: Secondary | ICD-10-CM

## 2024-03-01 NOTE — Progress Notes (Signed)
 Chief Complaint  Patient presents with   Medicare Wellness     Subjective:   [name] is a 68 y.o. male who presents for a Medicare Annual Wellness Visit.  Allergies (verified) Chantix  [varenicline  tartrate]   History: Past Medical History:  Diagnosis Date   Cerebral palsy (HCC)    Coronary artery disease    LAD 70% stenosis, cath 2019   Depression    GERD (gastroesophageal reflux disease)    Hypertension    Scoliosis    Seizures (HCC)    pt's sister states that patinet has had seizures in the past, pt camr for PAT by himself on last visit and she stst they misunderstood what he was trying to say. Pt saw neurologist in March who did CT and states patient does not have seizures. On no meds, had seizures as child.   Past Surgical History:  Procedure Laterality Date   CARPAL TUNNEL RELEASE Right 08/13/2016   Procedure: CARPAL TUNNEL RELEASE;  Surgeon: Taft FORBES Minerva, MD;  Location: [facility];  Service: Orthopedics;  Laterality: Right;   CORONARY ANGIOPLASTY WITH STENT PLACEMENT     LEFT HEART CATH AND CORONARY ANGIOGRAPHY N/A 07/10/2017   Procedure: LEFT HEART CATH AND CORONARY ANGIOGRAPHY;  Surgeon: Burnard Debby LABOR, MD;  Location: [facility];  Service: Cardiovascular;  Laterality: N/A;   Family History  Problem Relation Age of Onset   CAD Father 64       CABG   Heart disease Father    CAD Brother    Diabetes Mother    Alcohol abuse Brother    CAD Sister    Heart failure Sister    Social History   Occupational History   Occupation: disabled  Tobacco Use   Smoking status: Every Day    Current packs/day: 0.00    Average packs/day: 0.5 packs/day for 41.0 years (20.5 ttl pk-yrs)    Types: Cigarettes    Start date: 06/06/1977    Last attempt to quit: 06/06/2018    Years since quitting: 5.7   Smokeless tobacco: Never  Vaping Use   Vaping status: Never Used  Substance and Sexual Activity   Alcohol use: Yes    Comment: once in a blue moon typically  drinks beer 2 weeks or longer   Drug use: No   Sexual activity: Never    Birth control/protection: None   Tobacco Counseling Ready to quit: No Counseling given: Yes  SDOH Screenings   Food Insecurity: No Food Insecurity (03/01/2024)  Housing: Unknown (03/01/2024)  Transportation Needs: No Transportation Needs (03/01/2024)  Utilities: Not At Risk (03/01/2024)  Alcohol Screen: Low Risk  (03/01/2023)  Depression (PHQ2-9): Low Risk  (03/01/2024)  Financial Resource Strain: Low Risk  (03/01/2023)  Physical Activity: Insufficiently Active (03/01/2024)  Social Connections: Socially Isolated (03/01/2024)  Stress: No Stress Concern Present (03/01/2024)  Tobacco Use: High Risk (03/01/2024)  Health Literacy: Inadequate Health Literacy (03/01/2024)   Depression Screen    03/01/2024    9:56 AM 01/18/2024    8:30 AM 03/16/2023   10:25 AM 03/01/2023   11:30 AM 12/08/2022   11:35 AM 08/26/2022   11:21 AM 08/26/2022   11:20 AM  PHQ 2/9 Scores  PHQ - 2 Score 0 0 0 0 0 0 0     Goals Addressed               This Visit's Progress     Patient Stated (pt-stated)   On track  I want to get around and piddle around in my house more       Visit info / Clinical Intake: Medicare Wellness Visit Type:: Subsequent Annual Wellness Visit Persons participating in visit:: patient Medicare Wellness Visit Mode:: Telephone If telephone:: video declined Because this visit was a virtual/telehealth visit:: vitals recorded from last visit If Telephone or Video please confirm:: I connected with the patient using audio enabled telemedicine application and verified that I am speaking with the correct person using two identifiers Patient Location:: home Provider Location:: home office Information given by:: patient Interpreter Needed?: No Pre-visit prep was completed: yes AWV questionnaire completed by patient prior to visit?: no Living arrangements:: lives with spouse/significant other Patient's  Overall Health Status Rating: very good Typical amount of pain: none Does pain affect daily life?: no Are you currently prescribed opioids?: no  Dietary Habits and Nutritional Risks How many meals a day?: 3 Eats fruit and vegetables daily?: yes Most meals are obtained by: preparing own meals In the last 2 weeks, have you had any of the following?: none Diabetic:: no  Functional Status Activities of Daily Living (to include ambulation/medication): Independent Ambulation: Independent with device- listed below Home Assistive Devices/Equipment: -- (cane) Medication Administration: Independent Home Management: Independent Manage your own finances?: yes Primary transportation is: -- (pt's nurse takes pt to) Concerns about hearing?: no  Fall Screening Falls in the past year?: 0 Number of falls in past year: 0 Was there an injury with Fall?: 0 Fall Risk Category Calculator: 0 Patient Fall Risk Level: Low Fall Risk  Fall Risk Patient at Risk for Falls Due to: No Fall Risks Fall risk Follow up: Falls evaluation completed; Education provided  Home and Transportation Safety: All rugs have non-skid backing?: yes All stairs or steps have railings?: N/A, no stairs Grab bars in the bathtub or shower?: yes Have non-skid surface in bathtub or shower?: yes Good home lighting?: yes Regular seat belt use?: yes Hospital stays in the last year:: no  Cognitive Assessment Difficulty concentrating, remembering, or making decisions? : no Will 6CIT or Mini Cog be Completed: yes What year is it?: 4 points What month is it?: 3 points Give patient an address phrase to remember (5 components): [address] [place], [place] About what time is it?: 3 points Count backwards from 20 to 1: 4 points Say the months of the year in reverse: 4 points Repeat the address phrase from earlier: 0 points (pt's nurse help) 6 CIT Score: 18 points  Advance Directives (For Healthcare) Does Patient Have a Medical Advance  Directive?: No Would patient like information on creating a medical advance directive?: No - Patient declined  Reviewed/Updated  Reviewed/Updated: Reviewed All (Medical, Surgical, Family, Medications, Allergies, Care Teams, Patient Goals); Medical History; Surgical History; Family History; Medications; Allergies; Care Teams; Patient Goals        Objective:    Today's Vitals   03/01/24 0944  BP: 116/72  Pulse: 67  Weight: 242 lb (109.8 kg)  Height: 6' 1 (1.854 m)   Body mass index is 31.93 kg/m.  Current Medications (verified) Outpatient Encounter Medications as of 03/01/2024  Medication Sig   acetaminophen  (TYLENOL ) 500 MG tablet Take 1 tablet (500 mg total) by mouth every 6 (six) hours as needed.   albuterol  (VENTOLIN  HFA) 108 (90 Base) MCG/ACT inhaler Inhale 2 puffs into the lungs every 6 (six) hours as needed for wheezing or shortness of breath.   aspirin  EC 81 MG tablet Take 1 tablet (81 mg total)  by mouth daily.   atorvastatin  (LIPITOR) 40 MG tablet TAKE ONE TABLET AT 6PM   budesonide -glycopyrrolate-formoterol (BREZTRI AEROSPHERE) 160-9-4.8 MCG/ACT AERO inhaler Inhale 2 puffs into the lungs in the morning and at bedtime.   buPROPion  (WELLBUTRIN  SR) 150 MG 12 hr tablet TAKE ONE TABLET DAILY WITH BREAKFAST   cetaphil (CETAPHIL) lotion Apply 1 application topically 2 (two) times daily. For rash. Apply after washing with Dove ManCare shower gel and rinsing with clear warm water.   DULoxetine  (CYMBALTA ) 60 MG capsule TAKE ONE CAPSULE ONCE DAILY WITH LARGEST MEAL   fexofenadine  (ALLEGRA ) 180 MG tablet Take 1 tablet (180 mg total) by mouth daily. For allergy symptoms   fluticasone  (FLONASE ) 50 MCG/ACT nasal spray Place 2 sprays into both nostrils daily as needed for allergies or rhinitis.   hydrocortisone  cream 1 % Apply 1 Application topically 2 (two) times daily.   isosorbide  mononitrate (IMDUR ) 60 MG 24 hr tablet TAKE ONE TABLET ONCE DAILY   metoprolol  tartrate (LOPRESSOR ) 25 MG  tablet TAKE ONE TABLET TWICE DAILY   mirtazapine  (REMERON ) 15 MG tablet TAKE ONE TABLET AT BEDTIME AS NEEDED FOR SLEEP   nitroGLYCERIN  (NITROSTAT ) 0.4 MG SL tablet Place 1 tablet (0.4 mg total) under the tongue every 5 (five) minutes x 3 doses as needed for chest pain.   pantoprazole  (PROTONIX ) 40 MG tablet TAKE ONE TABLET TWICE DAILY   tamsulosin  (FLOMAX ) 0.4 MG CAPS capsule TAKE TWO CAPSULES DAILY AFTER SUPPER   Facility-Administered Encounter Medications as of 03/01/2024  Medication   cyanocobalamin  ((VITAMIN B-12)) injection 1,000 mcg   Hearing/Vision screen Hearing Screening - Comments:: Pt denies hearing dif Vision Screening - Comments:: Pt wear glasses/pt goes to [facility] in [place], [place]/last 01/2024 Immunizations and Health Maintenance Health Maintenance  Topic Date Due   DTaP/Tdap/Td (2 - Td or Tdap) 12/04/2021   COVID-19 Vaccine (3 - 2025-26 season) 12/20/2023   Zoster Vaccines- Shingrix  (2 of 2) 04/18/2024 (Originally 10/21/2022)   Lung Cancer Screening  04/21/2024   Medicare Annual Wellness (AWV)  03/01/2025   Colonoscopy  04/18/2025   Pneumococcal Vaccine: 50+ Years  Completed   Influenza Vaccine  Completed   Hepatitis C Screening  Completed   Meningococcal B Vaccine  Aged Out        Assessment/Plan:  This is a routine wellness examination for Jaimen.  Patient Care Team: Zollie Lowers, MD as PCP - General (Family Medicine) Lavona Agent, MD as PCP - Cardiology (Cardiology)  I have personally reviewed and noted the following in the patient's chart:   Medical and social history Use of alcohol, tobacco or illicit drugs  Current medications and supplements including opioid prescriptions. Functional ability and status Nutritional status Physical activity Advanced directives List of other physicians Hospitalizations, surgeries, and ER visits in previous 12 months Vitals Screenings to include cognitive, depression, and falls Referrals and appointments  No  orders of the defined types were placed in this encounter.  In addition, I have reviewed and discussed with patient certain preventive protocols, quality metrics, and best practice recommendations. A written personalized care plan for preventive services as well as general preventive health recommendations were provided to patient.   Ozie Ned, CMA   03/01/2024   Return in 1 year (on 03/01/2025).  After Visit Summary: (MyChart) Due to this being a telephonic visit, the after visit summary with patients personalized plan was offered to patient via MyChart   Nurse Notes: n/a

## 2024-03-02 ENCOUNTER — Ambulatory Visit: Admitting: *Deleted

## 2024-03-02 DIAGNOSIS — E538 Deficiency of other specified B group vitamins: Secondary | ICD-10-CM | POA: Diagnosis not present

## 2024-03-02 DIAGNOSIS — Z23 Encounter for immunization: Secondary | ICD-10-CM

## 2024-03-02 NOTE — Progress Notes (Signed)
 Patient is in office today for a nurse visit for B12 Injection.  Injection was given in the  Left deltoid. Patient tolerated injection well.  Patient is in office today for a nurse visit for Immunization (Tdap). Injection was given in the  Right deltoid. Patient tolerated injection well.

## 2024-03-19 NOTE — Progress Notes (Deleted)
  Cardiology Office Note:   Date:  03/19/2024  ID:  [name], DOB 1956/03/03, MRN [id number] PCP: Zollie Lowers, MD  [facility] Providers Cardiologist:  Lynwood Schilling, MD {  History of Present Illness:   [name] is a 68 y.o. male who presents for follow up of CAD.  The patient has a history of CAD. He has has had a remote LHC that showed 30% LAD and 70% diagonal lesion. He has reported having stents in the past, although this was felt to be possibly incorrect per prior cardiology evaluation.  He had chest pain and had cath in 2019.  The patient underwent LHC that showed a 70% LAD stenosis with other nonobstructive CAD and normal LV gram; no culprit lesion was identified.  He was managed medically.     Since I last saw him ***  ***  he is complaining mostly of his legs giving out from underneath him.  He says this happens when he walks and sometimes has fallen.  Not really describing pain although he is somewhat vague about this.  He says he does get some chest discomfort sometimes before this or might have it afterwards.  He is not describing any neck or arm discomfort.  He is not getting any new shortness of breath, PND or orthopnea.  He is not describing palpitations, presyncope or syncope.  At the last visit I was going to order a sleep study on him because he has such significant fatigue but he did not have this done.  He wants to inquire about a home sleep study.  ROS: ***  Studies Reviewed:    EKG:       ***  Risk Assessment/Calculations:   {Does this patient have ATRIAL FIBRILLATION?:[phone]} No BP recorded.  {Refresh Note OR Click here to enter BP  :1}***        Physical Exam:   VS:  There were no vitals taken for this visit.   Wt Readings from Last 3 Encounters:  03/01/24 242 lb (109.8 kg)  01/18/24 242 lb (109.8 kg)  06/17/23 248 lb 12.8 oz (112.9 kg)     GEN: Well nourished, well developed in no acute distress NECK: No JVD; No carotid  bruits CARDIAC: ***RR, *** murmurs, rubs, gallops RESPIRATORY:  Clear to auscultation without rales, wheezing or rhonchi  ABDOMEN: Soft, non-tender, non-distended EXTREMITIES:  No edema; No deformity   ASSESSMENT AND PLAN:   CAD: ***  He is describing some chest discomfort and some other vague symptoms.  He has known moderate LAD disease.  I think it is prudent to screen him with a Lexiscan  Myoview.   HTN: His blood pressure is ***  at target.  No change in therapy.   DYSLIPIDEMIA:   LDL was ***  34 with an HDL of 25.  No change in therapy.    TOBACCO ABUSE: We had a conversation about the need to stop smoking again.   FATIGUE:  ***   He says he would not want to have an in lab sleep study so I will see if I can arrange a home sleep study.  Stop bang is 5.     Follow up ***  Signed, Lynwood Schilling, MD

## 2024-03-22 ENCOUNTER — Ambulatory Visit: Admitting: Cardiology

## 2024-03-22 DIAGNOSIS — I25118 Atherosclerotic heart disease of native coronary artery with other forms of angina pectoris: Secondary | ICD-10-CM

## 2024-03-22 DIAGNOSIS — E785 Hyperlipidemia, unspecified: Secondary | ICD-10-CM

## 2024-03-22 DIAGNOSIS — Z720 Tobacco use: Secondary | ICD-10-CM

## 2024-03-22 DIAGNOSIS — I10 Essential (primary) hypertension: Secondary | ICD-10-CM

## 2024-03-23 ENCOUNTER — Ambulatory Visit: Payer: Self-pay | Admitting: Family Medicine

## 2024-04-03 ENCOUNTER — Ambulatory Visit (INDEPENDENT_AMBULATORY_CARE_PROVIDER_SITE_OTHER): Admitting: *Deleted

## 2024-04-03 DIAGNOSIS — E538 Deficiency of other specified B group vitamins: Secondary | ICD-10-CM

## 2024-04-03 NOTE — Progress Notes (Signed)
 Patient is in office today for a nurse visit for B12 Injection. Patient Injection was given in the  Right deltoid. Patient tolerated injection well.

## 2024-04-18 ENCOUNTER — Other Ambulatory Visit: Payer: Self-pay | Admitting: Family Medicine

## 2024-04-18 DIAGNOSIS — I25118 Atherosclerotic heart disease of native coronary artery with other forms of angina pectoris: Secondary | ICD-10-CM

## 2024-04-18 DIAGNOSIS — F411 Generalized anxiety disorder: Secondary | ICD-10-CM

## 2024-04-18 DIAGNOSIS — I209 Angina pectoris, unspecified: Secondary | ICD-10-CM

## 2024-04-18 DIAGNOSIS — I10 Essential (primary) hypertension: Secondary | ICD-10-CM

## 2024-04-18 DIAGNOSIS — F3341 Major depressive disorder, recurrent, in partial remission: Secondary | ICD-10-CM

## 2024-04-18 DIAGNOSIS — G4701 Insomnia due to medical condition: Secondary | ICD-10-CM

## 2024-04-27 ENCOUNTER — Other Ambulatory Visit: Payer: Self-pay | Admitting: Acute Care

## 2024-04-27 DIAGNOSIS — Z87891 Personal history of nicotine dependence: Secondary | ICD-10-CM

## 2024-04-27 DIAGNOSIS — Z122 Encounter for screening for malignant neoplasm of respiratory organs: Secondary | ICD-10-CM

## 2024-04-27 DIAGNOSIS — F17210 Nicotine dependence, cigarettes, uncomplicated: Secondary | ICD-10-CM

## 2024-05-01 ENCOUNTER — Ambulatory Visit (HOSPITAL_COMMUNITY)

## 2024-05-02 ENCOUNTER — Other Ambulatory Visit: Payer: Self-pay | Admitting: Family Medicine

## 2024-05-02 DIAGNOSIS — K219 Gastro-esophageal reflux disease without esophagitis: Secondary | ICD-10-CM

## 2024-05-02 DIAGNOSIS — N401 Enlarged prostate with lower urinary tract symptoms: Secondary | ICD-10-CM

## 2024-05-02 DIAGNOSIS — E785 Hyperlipidemia, unspecified: Secondary | ICD-10-CM

## 2024-05-02 DIAGNOSIS — I25118 Atherosclerotic heart disease of native coronary artery with other forms of angina pectoris: Secondary | ICD-10-CM

## 2024-05-04 ENCOUNTER — Ambulatory Visit (INDEPENDENT_AMBULATORY_CARE_PROVIDER_SITE_OTHER): Admitting: *Deleted

## 2024-05-04 DIAGNOSIS — E538 Deficiency of other specified B group vitamins: Secondary | ICD-10-CM | POA: Diagnosis not present

## 2024-05-04 NOTE — Progress Notes (Signed)
 Patient is in office today for a nurse visit for B12 Injection. Patient Injection was given in the  Right deltoid. Patient tolerated injection well.

## 2024-05-24 ENCOUNTER — Ambulatory Visit (HOSPITAL_COMMUNITY)

## 2024-06-05 ENCOUNTER — Ambulatory Visit

## 2024-06-14 ENCOUNTER — Ambulatory Visit: Admitting: Cardiology

## 2025-03-02 ENCOUNTER — Ambulatory Visit
